# Patient Record
Sex: Female | Born: 1945
Health system: Southern US, Community
[De-identification: ages and names within clinical notes are randomized; demographics above are authoritative.]

## PROBLEM LIST (undated history)

## (undated) DIAGNOSIS — E669 Obesity, unspecified: Secondary | ICD-10-CM

## (undated) DIAGNOSIS — K219 Gastro-esophageal reflux disease without esophagitis: Secondary | ICD-10-CM

## (undated) DIAGNOSIS — F32A Depression, unspecified: Secondary | ICD-10-CM

## (undated) DIAGNOSIS — J449 Chronic obstructive pulmonary disease, unspecified: Secondary | ICD-10-CM

## (undated) DIAGNOSIS — M545 Low back pain, unspecified: Secondary | ICD-10-CM

## (undated) DIAGNOSIS — G8929 Other chronic pain: Secondary | ICD-10-CM

## (undated) DIAGNOSIS — I6529 Occlusion and stenosis of unspecified carotid artery: Secondary | ICD-10-CM

## (undated) DIAGNOSIS — G4719 Other hypersomnia: Secondary | ICD-10-CM

## (undated) DIAGNOSIS — R490 Dysphonia: Secondary | ICD-10-CM

## (undated) DIAGNOSIS — J189 Pneumonia, unspecified organism: Secondary | ICD-10-CM

## (undated) DIAGNOSIS — I480 Paroxysmal atrial fibrillation: Secondary | ICD-10-CM

## (undated) DIAGNOSIS — I5030 Unspecified diastolic (congestive) heart failure: Secondary | ICD-10-CM

## (undated) DIAGNOSIS — F329 Major depressive disorder, single episode, unspecified: Secondary | ICD-10-CM

## (undated) DIAGNOSIS — E119 Type 2 diabetes mellitus without complications: Secondary | ICD-10-CM

## (undated) DIAGNOSIS — E785 Hyperlipidemia, unspecified: Secondary | ICD-10-CM

## (undated) DIAGNOSIS — R918 Other nonspecific abnormal finding of lung field: Secondary | ICD-10-CM

## (undated) DIAGNOSIS — M1990 Unspecified osteoarthritis, unspecified site: Secondary | ICD-10-CM

## (undated) HISTORY — DX: Occlusion and stenosis of unspecified carotid artery: I65.29

## (undated) HISTORY — DX: Paroxysmal atrial fibrillation: I48.0

## (undated) HISTORY — DX: Dysphonia: R49.0

## (undated) HISTORY — DX: Low back pain, unspecified: M54.50

## (undated) HISTORY — DX: Type 2 diabetes mellitus without complications: E11.9

## (undated) HISTORY — DX: Other hypersomnia: G47.19

## (undated) HISTORY — DX: Chronic obstructive pulmonary disease, unspecified: J44.9

## (undated) HISTORY — DX: Other nonspecific abnormal finding of lung field: R91.8

## (undated) HISTORY — PX: BREAST BIOPSY: SHX20

## (undated) HISTORY — DX: Hyperlipidemia, unspecified: E78.5

## (undated) HISTORY — DX: Unspecified diastolic (congestive) heart failure: I50.30

## (undated) HISTORY — DX: Other chronic pain: G89.29

## (undated) HISTORY — PX: BREAST LUMPECTOMY: SHX2

## (undated) HISTORY — DX: Obesity, unspecified: E66.9

## (undated) HISTORY — DX: Low back pain: M54.5

---

## 1989-07-28 DIAGNOSIS — J189 Pneumonia, unspecified organism: Secondary | ICD-10-CM

## 1989-07-28 HISTORY — DX: Pneumonia, unspecified organism: J18.9

## 1992-11-27 HISTORY — PX: TOTAL ABDOMINAL HYSTERECTOMY: SHX209

## 2001-04-30 ENCOUNTER — Other Ambulatory Visit: Admission: RE | Admit: 2001-04-30 | Discharge: 2001-04-30 | Payer: Self-pay | Admitting: Obstetrics and Gynecology

## 2005-04-03 ENCOUNTER — Emergency Department (HOSPITAL_COMMUNITY): Admission: EM | Admit: 2005-04-03 | Discharge: 2005-04-03 | Payer: Self-pay | Admitting: Emergency Medicine

## 2005-07-06 ENCOUNTER — Emergency Department (HOSPITAL_COMMUNITY): Admission: EM | Admit: 2005-07-06 | Discharge: 2005-07-06 | Payer: Self-pay | Admitting: Emergency Medicine

## 2005-07-26 ENCOUNTER — Ambulatory Visit: Payer: Self-pay | Admitting: Internal Medicine

## 2008-12-28 ENCOUNTER — Inpatient Hospital Stay (HOSPITAL_COMMUNITY): Admission: EM | Admit: 2008-12-28 | Discharge: 2009-01-08 | Payer: Self-pay | Admitting: Emergency Medicine

## 2008-12-28 ENCOUNTER — Ambulatory Visit: Payer: Self-pay | Admitting: Cardiology

## 2009-01-05 ENCOUNTER — Encounter (INDEPENDENT_AMBULATORY_CARE_PROVIDER_SITE_OTHER): Payer: Self-pay | Admitting: Internal Medicine

## 2009-01-20 ENCOUNTER — Ambulatory Visit: Payer: Self-pay | Admitting: Cardiology

## 2009-01-25 ENCOUNTER — Ambulatory Visit: Payer: Self-pay | Admitting: Cardiology

## 2009-01-25 LAB — CONVERTED CEMR LAB
GFR calc Af Amer: 93 mL/min
GFR calc non Af Amer: 77 mL/min
Glucose, Bld: 133 mg/dL — ABNORMAL HIGH (ref 70–99)
Potassium: 3.8 meq/L (ref 3.5–5.1)
Sodium: 139 meq/L (ref 135–145)

## 2009-02-04 ENCOUNTER — Ambulatory Visit: Payer: Self-pay | Admitting: Cardiology

## 2009-02-04 LAB — CONVERTED CEMR LAB
BUN: 18 mg/dL (ref 6–23)
Chloride: 104 meq/L (ref 96–112)
GFR calc non Af Amer: 77 mL/min
Potassium: 4.3 meq/L (ref 3.5–5.1)

## 2009-02-15 ENCOUNTER — Ambulatory Visit: Payer: Self-pay | Admitting: Cardiology

## 2009-03-01 ENCOUNTER — Ambulatory Visit: Payer: Self-pay | Admitting: Cardiology

## 2009-03-23 ENCOUNTER — Ambulatory Visit: Payer: Self-pay | Admitting: Cardiology

## 2009-04-14 ENCOUNTER — Ambulatory Visit: Payer: Self-pay | Admitting: Cardiology

## 2009-04-28 ENCOUNTER — Encounter: Payer: Self-pay | Admitting: *Deleted

## 2009-04-29 ENCOUNTER — Ambulatory Visit: Payer: Self-pay | Admitting: Cardiology

## 2009-04-29 LAB — CONVERTED CEMR LAB: Protime: 20.2

## 2009-05-05 DIAGNOSIS — I480 Paroxysmal atrial fibrillation: Secondary | ICD-10-CM | POA: Insufficient documentation

## 2009-05-05 DIAGNOSIS — J439 Emphysema, unspecified: Secondary | ICD-10-CM | POA: Insufficient documentation

## 2009-05-14 ENCOUNTER — Ambulatory Visit: Payer: Self-pay | Admitting: Cardiology

## 2009-05-14 ENCOUNTER — Encounter (INDEPENDENT_AMBULATORY_CARE_PROVIDER_SITE_OTHER): Payer: Self-pay | Admitting: Pharmacist

## 2009-05-14 DIAGNOSIS — E785 Hyperlipidemia, unspecified: Secondary | ICD-10-CM

## 2009-05-14 DIAGNOSIS — R0989 Other specified symptoms and signs involving the circulatory and respiratory systems: Secondary | ICD-10-CM

## 2009-05-14 DIAGNOSIS — R498 Other voice and resonance disorders: Secondary | ICD-10-CM

## 2009-05-24 ENCOUNTER — Ambulatory Visit: Payer: Self-pay | Admitting: Cardiology

## 2009-05-24 ENCOUNTER — Ambulatory Visit: Payer: Self-pay

## 2009-05-24 DIAGNOSIS — R0609 Other forms of dyspnea: Secondary | ICD-10-CM

## 2009-05-24 DIAGNOSIS — R0989 Other specified symptoms and signs involving the circulatory and respiratory systems: Secondary | ICD-10-CM

## 2009-05-25 LAB — CONVERTED CEMR LAB
Alkaline Phosphatase: 51 units/L (ref 39–117)
BUN: 20 mg/dL (ref 6–23)
Bilirubin, Direct: 0.1 mg/dL (ref 0.0–0.3)
CO2: 30 meq/L (ref 19–32)
Chloride: 106 meq/L (ref 96–112)
Creatinine, Ser: 0.9 mg/dL (ref 0.4–1.2)
LDL Cholesterol: 68 mg/dL (ref 0–99)
Total Bilirubin: 0.6 mg/dL (ref 0.3–1.2)
Total CHOL/HDL Ratio: 3

## 2009-06-02 ENCOUNTER — Encounter: Payer: Self-pay | Admitting: *Deleted

## 2009-06-03 ENCOUNTER — Telehealth: Payer: Self-pay | Admitting: Cardiology

## 2009-06-07 ENCOUNTER — Encounter (INDEPENDENT_AMBULATORY_CARE_PROVIDER_SITE_OTHER): Payer: Self-pay | Admitting: Cardiology

## 2009-06-07 ENCOUNTER — Ambulatory Visit: Payer: Self-pay | Admitting: Cardiovascular Disease

## 2009-06-08 ENCOUNTER — Ambulatory Visit: Payer: Self-pay | Admitting: Internal Medicine

## 2009-06-10 ENCOUNTER — Encounter: Payer: Self-pay | Admitting: Internal Medicine

## 2009-06-16 ENCOUNTER — Encounter: Payer: Self-pay | Admitting: Cardiology

## 2009-06-16 ENCOUNTER — Ambulatory Visit: Payer: Self-pay | Admitting: Cardiology

## 2009-06-16 LAB — CONVERTED CEMR LAB
POC INR: 4
Prothrombin Time: 24.2 s

## 2009-06-24 ENCOUNTER — Ambulatory Visit: Payer: Self-pay | Admitting: Internal Medicine

## 2009-06-24 LAB — CONVERTED CEMR LAB: POC INR: 4.5

## 2009-07-08 ENCOUNTER — Ambulatory Visit: Payer: Self-pay | Admitting: Internal Medicine

## 2009-07-08 LAB — CONVERTED CEMR LAB: POC INR: 3.3

## 2009-07-28 ENCOUNTER — Ambulatory Visit: Payer: Self-pay | Admitting: Cardiology

## 2009-07-28 LAB — CONVERTED CEMR LAB: POC INR: 3.6

## 2009-08-04 ENCOUNTER — Ambulatory Visit: Payer: Self-pay | Admitting: Cardiology

## 2009-08-04 ENCOUNTER — Ambulatory Visit: Payer: Self-pay | Admitting: Pulmonary Disease

## 2009-08-04 DIAGNOSIS — J438 Other emphysema: Secondary | ICD-10-CM | POA: Insufficient documentation

## 2009-08-09 DIAGNOSIS — J984 Other disorders of lung: Secondary | ICD-10-CM

## 2009-08-11 ENCOUNTER — Ambulatory Visit: Payer: Self-pay | Admitting: Cardiology

## 2009-09-02 ENCOUNTER — Ambulatory Visit: Payer: Self-pay | Admitting: Cardiology

## 2009-09-02 LAB — CONVERTED CEMR LAB: POC INR: 2.6

## 2009-09-13 ENCOUNTER — Encounter (INDEPENDENT_AMBULATORY_CARE_PROVIDER_SITE_OTHER): Payer: Self-pay | Admitting: *Deleted

## 2009-10-07 ENCOUNTER — Ambulatory Visit: Payer: Self-pay | Admitting: Internal Medicine

## 2009-10-07 LAB — CONVERTED CEMR LAB: POC INR: 3.1

## 2009-11-04 ENCOUNTER — Ambulatory Visit: Payer: Self-pay | Admitting: Cardiology

## 2009-12-02 ENCOUNTER — Encounter (INDEPENDENT_AMBULATORY_CARE_PROVIDER_SITE_OTHER): Payer: Self-pay | Admitting: *Deleted

## 2009-12-17 ENCOUNTER — Encounter (INDEPENDENT_AMBULATORY_CARE_PROVIDER_SITE_OTHER): Payer: Self-pay | Admitting: Cardiology

## 2010-01-26 ENCOUNTER — Ambulatory Visit: Payer: Self-pay | Admitting: Cardiology

## 2010-01-31 ENCOUNTER — Ambulatory Visit: Payer: Self-pay | Admitting: Pulmonary Disease

## 2010-02-24 ENCOUNTER — Ambulatory Visit: Payer: Self-pay | Admitting: Internal Medicine

## 2010-03-31 ENCOUNTER — Ambulatory Visit: Payer: Self-pay | Admitting: Internal Medicine

## 2010-04-28 ENCOUNTER — Ambulatory Visit: Payer: Self-pay | Admitting: Internal Medicine

## 2010-05-26 ENCOUNTER — Ambulatory Visit: Payer: Self-pay | Admitting: Cardiology

## 2010-06-23 ENCOUNTER — Ambulatory Visit: Payer: Self-pay | Admitting: Internal Medicine

## 2010-06-23 LAB — CONVERTED CEMR LAB: POC INR: 2.3

## 2010-09-01 ENCOUNTER — Telehealth: Payer: Self-pay | Admitting: Cardiology

## 2010-09-09 ENCOUNTER — Ambulatory Visit: Payer: Self-pay | Admitting: Cardiology

## 2010-09-26 ENCOUNTER — Ambulatory Visit: Payer: Self-pay | Admitting: Cardiology

## 2010-09-26 DIAGNOSIS — R079 Chest pain, unspecified: Secondary | ICD-10-CM

## 2010-09-27 ENCOUNTER — Telehealth: Payer: Self-pay | Admitting: Pulmonary Disease

## 2010-10-10 ENCOUNTER — Telehealth (INDEPENDENT_AMBULATORY_CARE_PROVIDER_SITE_OTHER): Payer: Self-pay | Admitting: *Deleted

## 2010-10-11 ENCOUNTER — Encounter (HOSPITAL_COMMUNITY)
Admission: RE | Admit: 2010-10-11 | Discharge: 2010-11-26 | Payer: Self-pay | Source: Home / Self Care | Attending: Cardiology | Admitting: Cardiology

## 2010-10-11 ENCOUNTER — Ambulatory Visit: Payer: Self-pay | Admitting: Cardiology

## 2010-10-11 ENCOUNTER — Encounter: Payer: Self-pay | Admitting: Cardiovascular Disease

## 2010-10-11 ENCOUNTER — Ambulatory Visit: Payer: Self-pay

## 2010-10-11 ENCOUNTER — Encounter: Payer: Self-pay | Admitting: Cardiology

## 2010-10-13 ENCOUNTER — Ambulatory Visit: Payer: Self-pay

## 2010-10-25 ENCOUNTER — Ambulatory Visit: Payer: Self-pay | Admitting: Pulmonary Disease

## 2010-11-01 ENCOUNTER — Ambulatory Visit: Payer: Self-pay | Admitting: Cardiovascular Disease

## 2010-11-07 ENCOUNTER — Encounter: Payer: Self-pay | Admitting: Cardiology

## 2010-11-29 ENCOUNTER — Ambulatory Visit: Admission: RE | Admit: 2010-11-29 | Discharge: 2010-11-29 | Payer: Self-pay | Source: Home / Self Care

## 2010-11-29 LAB — CONVERTED CEMR LAB: POC INR: 3.8

## 2010-12-08 ENCOUNTER — Ambulatory Visit: Admission: RE | Admit: 2010-12-08 | Discharge: 2010-12-08 | Payer: Self-pay | Source: Home / Self Care

## 2010-12-08 LAB — CONVERTED CEMR LAB: POC INR: 3.6

## 2010-12-18 ENCOUNTER — Encounter: Payer: Self-pay | Admitting: Cardiology

## 2010-12-18 ENCOUNTER — Encounter: Payer: Self-pay | Admitting: Family Medicine

## 2010-12-22 ENCOUNTER — Ambulatory Visit: Admission: RE | Admit: 2010-12-22 | Discharge: 2010-12-22 | Payer: Self-pay | Source: Home / Self Care

## 2010-12-22 LAB — CONVERTED CEMR LAB: POC INR: 3.4

## 2010-12-25 LAB — CONVERTED CEMR LAB
BUN: 18 mg/dL (ref 6–23)
CO2: 26 meq/L (ref 19–32)
GFR calc non Af Amer: 48.81 mL/min (ref >60)
Glucose, Bld: 138 mg/dL — ABNORMAL HIGH (ref 70–99)
POC INR: 5.1
Potassium: 4.1 meq/L (ref 3.5–5.1)
Protime: 27.3
Sodium: 139 meq/L (ref 135–145)

## 2010-12-29 NOTE — Medication Information (Signed)
Summary: rov/eac  Anticoagulant Therapy  Managed by: Weston Brass, PharmD PCP: [facility] Supervising MD: Gala Romney MD, Reuel Boom Indication 1: Atrial Fibrillation (ICD-427.31) Lab Used: [facility] [place] Site: [address] INR POC 2.8 INR RANGE 2 - 3  Dietary changes: no    Health status changes: no    Bleeding/hemorrhagic complications: no    Recent/future hospitalizations: no    Any changes in medication regimen? no    Recent/future dental: no  Any missed doses?: no       Is patient compliant with meds? yes       Allergies: 1)  ! Clindamycin 2)  ! Ace Inhibitors  Anticoagulation Management History:      The patient is taking warfarin and comes in today for a routine follow up visit.  Negative risk factors for bleeding include an age less than 89 years old.  The bleeding index is 'low risk'.  Positive CHADS2 values include History of CHF.  Negative CHADS2 values include Age > 85 years old.  The start date was 01/05/2009.  Anticoagulation responsible provider: Zyrus Hetland MD, Reuel Boom.  INR POC: 2.8.  Cuvette Lot#: [id number].  Exp: 04/2011.    Anticoagulation Management Assessment/Plan:      The patient's current anticoagulation dose is Warfarin sodium 2.5 mg tabs: Take as directed by coumadin clinic..  The target INR is 2 - 3.  The next INR is due 04/28/2010.  Anticoagulation instructions were given to patient.  Results were reviewed/authorized by Weston Brass, PharmD.  She was notified by Weston Brass PharmD.         Prior Anticoagulation Instructions: INR 2.5  Continue taking 1/2 tablet on Monday and 1 tablet all other days.  Return to clinic in 4 weeks.   Current Anticoagulation Instructions: INR 2.8  Continue same dose of 1 tablet every day except 1/2 tablet on Monday

## 2010-12-29 NOTE — Medication Information (Signed)
Summary: rov/ewj  Anticoagulant Therapy  Managed by: Cloyde Reams, RN, BSN PCP: [facility] Supervising MD: Riley Kill MD, Maisie Fus Indication 1: Atrial Fibrillation (ICD-427.31) Lab Used: [facility] [place] Site: [address] INR POC 2.5 INR RANGE 2 - 3  Dietary changes: no    Health status changes: no    Bleeding/hemorrhagic complications: no    Recent/future hospitalizations: no    Any changes in medication regimen? no    Recent/future dental: no  Any missed doses?: no       Is patient compliant with meds? yes       Allergies: 1)  ! Clindamycin 2)  ! Ace Inhibitors  Anticoagulation Management History:      The patient is taking warfarin and comes in today for a routine follow up visit.  Negative risk factors for bleeding include an age less than 36 years old.  The bleeding index is 'low risk'.  Positive CHADS2 values include History of CHF.  Negative CHADS2 values include Age > 36 years old.  The start date was 01/05/2009.  Anticoagulation responsible provider: Riley Kill MD, Maisie Fus.  INR POC: 2.5.  Cuvette Lot#: [id number].  Exp: 03/2011.    Anticoagulation Management Assessment/Plan:      The patient's current anticoagulation dose is Warfarin sodium 2.5 mg tabs: Take as directed by coumadin clinic..  The target INR is 2 - 3.  The next INR is due 02/24/2010.  Anticoagulation instructions were given to patient.  Results were reviewed/authorized by Cloyde Reams, RN, BSN.  She was notified by Cloyde Reams RN.         Prior Anticoagulation Instructions: INR 2.7  Continue on same dosage 1 tablet daily except 1/2 tablet on Mondays.  Recheck in 4 weeks.    Current Anticoagulation Instructions: INR 2.5  Continue on same dosage 1 tablet daily except 1/2 tablet on Mondays.   Recheck in 4 weeks.   Prescriptions: WARFARIN SODIUM 2.5 MG TABS (WARFARIN SODIUM) Take as directed by coumadin clinic.  #35 x 1   Entered by:   Cloyde Reams RN   Authorized  by:   Marca Ancona, MD   Signed by:   Cloyde Reams RN on 01/26/2010   Method used:   [facility]        [facility]  [place] [address] [facility]       [address]       [place], [place]  [address]       Ph: [phone] or [phone]       Fax: [phone]   RxID:   [id number]

## 2010-12-29 NOTE — Assessment & Plan Note (Signed)
Summary: rov for emphysema   Copy to:  Dr. Shirlee Latch Primary Provider/Referring Provider:  [name]  CC:  Pt is here for a 6 month f/u appt.  Pt states breathing is unchanged from last visit.  Pt denied a cough.  pt denied any new complaints. .  History of Present Illness: The pt comes in today for f/u of her known emphysema.  She is maintaining on her bronchodilator regimen, and feels that she is improved.  She denies cough, congestion, or purulence.  Medications Prior to Update: 1)  Warfarin Sodium 2.5 Mg Tabs (Warfarin Sodium) .... Take As Directed By Coumadin Clinic. 2)  Symbicort 80-4.5 Mcg/act Aero (Budesonide-Formoterol Fumarate) .... Two Times A Day 3)  Furosemide 40 Mg Tabs (Furosemide) .... One  in The Morning and (1/2) in The Evening 4)  Klor-Con M20 20 Meq Cr-Tabs (Potassium Chloride Crys Cr) .... 2 Tablets Once Daily 5)  Calcium 500 Mg Tabs (Calcium Carbonate) .... Take One Tab Once Daily 6)  Magnesium 250 Mg Tabs (Magnesium) .... Once Daily 7)  Elite Zinc 15 Mg Caps (Zinc) .... Daily 8)  Vitamin D 1000 Unit Tabs (Cholecalciferol) .... Once Daily 9)  Diovan 80 Mg Tabs (Valsartan) .... Once Daily 10)  Diltiazem Hcl Cr 120 Mg Xr12h-Cap (Diltiazem Hcl) .... Once Daily 11)  Actos 45 Mg Tabs (Pioglitazone Hcl) .... Once Daily 12)  Glyburide Micronized 3 Mg Tabs (Glyburide Micronized) .... Once Daily 13)  Vytorin 10-40 Mg Tabs (Ezetimibe-Simvastatin) .... Once Daily 14)  Proair Hfa 108 (90 Base) Mcg/act Aers (Albuterol Sulfate) .... As Needed 15)  Spiriva Handihaler 18 Mcg Caps (Tiotropium Bromide Monohydrate) .... Use Daily As Directed  Allergies (verified): 1)  ! Clindamycin 2)  ! Ace Inhibitors  Review of Systems      See HPI  Vital Signs:  Patient profile:   65 year old female Height:      65 inches Weight:      294.38 pounds BMI:     49.16 O2 Sat:      92 % on Room air Temp:     97.6 degrees F oral Pulse rate:   96 / minute BP sitting:   118 /  56  (left arm) Cuff size:   wrist  Vitals Entered By: Arman Filter LPN (January 31, 453 9:46 AM)  O2 Flow:  Room air CC: Pt is here for a 6 month f/u appt.  Pt states breathing is unchanged from last visit.  Pt denied a cough.  pt denied any new complaints.  Comments Medications reviewed with patient Arman Filter LPN  February 01, 980 9:47 AM    Physical Exam  General:  obese female in nad Lungs:  mild decrease in bs, no wheezing or rhonchi Heart:  rrr Extremities:  1+ edema bilat., no cyanosis   Impression & Recommendations:  Problem # 1:  EMPHYSEMA (ICD-492.8)  the pt is doing well from a pulmonary standpoint.  She has been compliant with her meds, and has not had any recent acute exacerbation.  Her cough and congestion resolved since smoking cessation.  She still has hoarsenss which may be due to her vocal cord lesions, but the symbicort may be a contributor.  I have offered to try her on something different to see if hoarseness will improve, but she wishes to stay on current regimen.  I have encouraged her to work on weight loss and conditioning, and she intends on going to pulmonary rehab once she retires at  the end of the year.  Other Orders: Est. Patient Level II ([address])  Patient Instructions: 1)  no change in meds.   2)  work on weight loss and conditioning 3)  followup with me in 6mos   Immunization History:  Influenza Immunization History:    Influenza:  historical (08/27/2009)  Pneumovax Immunization History:    Pneumovax:  historical (12/28/2008)

## 2010-12-29 NOTE — Medication Information (Signed)
Summary: rov/ewj  Anticoagulant Therapy  Managed by: Eda Keys, PharmD PCP: [facility] Supervising MD: Ladona Ridgel MD, Sharlot Gowda Indication 1: Atrial Fibrillation (ICD-427.31) Lab Used: [facility] [place] Site: [address] INR POC 2.5 INR RANGE 2 - 3  Dietary changes: no    Health status changes: no    Bleeding/hemorrhagic complications: no    Recent/future hospitalizations: no    Any changes in medication regimen? no    Recent/future dental: no  Any missed doses?: no       Is patient compliant with meds? yes       Current Medications (verified): 1)  Warfarin Sodium 2.5 Mg Tabs (Warfarin Sodium) .... Take As Directed By Coumadin Clinic. 2)  Symbicort 80-4.5 Mcg/act Aero (Budesonide-Formoterol Fumarate) .... Two Times A Day 3)  Furosemide 40 Mg Tabs (Furosemide) .... One  in The Morning and (1/2) in The Evening 4)  Klor-Con M20 20 Meq Cr-Tabs (Potassium Chloride Crys Cr) .... 2 Tablets Once Daily 5)  Calcium 500 Mg Tabs (Calcium Carbonate) .... Take One Tab Once Daily 6)  Magnesium 250 Mg Tabs (Magnesium) .... Once Daily 7)  Elite Zinc 15 Mg Caps (Zinc) .... Daily 8)  Vitamin D 1000 Unit Tabs (Cholecalciferol) .... Once Daily 9)  Diovan 80 Mg Tabs (Valsartan) .... Once Daily 10)  Diltiazem Hcl Cr 120 Mg Xr12h-Cap (Diltiazem Hcl) .... Once Daily 11)  Actos 45 Mg Tabs (Pioglitazone Hcl) .... Once Daily 12)  Glyburide Micronized 3 Mg Tabs (Glyburide Micronized) .... Once Daily 13)  Vytorin 10-40 Mg Tabs (Ezetimibe-Simvastatin) .... Once Daily 14)  Proair Hfa 108 (90 Base) Mcg/act Aers (Albuterol Sulfate) .... As Needed 15)  Spiriva Handihaler 18 Mcg Caps (Tiotropium Bromide Monohydrate) .... Use Daily As Directed  Allergies (verified): 1)  ! Clindamycin 2)  ! Ace Inhibitors  Anticoagulation Management History:      The patient is taking warfarin and comes in today for a routine follow up visit.  Negative risk factors for bleeding include  an age less than 31 years old.  The bleeding index is 'low risk'.  Positive CHADS2 values include History of CHF.  Negative CHADS2 values include Age > 53 years old.  The start date was 01/05/2009.  Anticoagulation responsible provider: Ladona Ridgel MD, Sharlot Gowda.  INR POC: 2.5.  Cuvette Lot#: [id number].  Exp: 03/2011.    Anticoagulation Management Assessment/Plan:      The patient's current anticoagulation dose is Warfarin sodium 2.5 mg tabs: Take as directed by coumadin clinic..  The target INR is 2 - 3.  The next INR is due 03/31/2010.  Anticoagulation instructions were given to patient.  Results were reviewed/authorized by Eda Keys, PharmD.  She was notified by Eda Keys.         Prior Anticoagulation Instructions: INR 2.5  Continue on same dosage 1 tablet daily except 1/2 tablet on Mondays.   Recheck in 4 weeks.    Current Anticoagulation Instructions: INR 2.5  Continue taking 1/2 tablet on Monday and 1 tablet all other days.  Return to clinic in 4 weeks.

## 2010-12-29 NOTE — Assessment & Plan Note (Signed)
Summary: Cardiology Nuclear Testing  Nuclear Med Background Indications for Stress Test: Evaluation for Ischemia   History: COPD, CT/MRI, Emphysema  History Comments: 10/11 CT Hx A FIB   Symptoms: Chest Tightness with Exertion, DOE, Fatigue    Nuclear Pre-Procedure Cardiac Risk Factors: History of Smoking, Hypertension, Lipids, NIDDM, Obesity Caffeine/Decaff Intake: none NPO After: 5:30 AM Lungs: clear IV 0.9% NS with Angio Cath: 22g     IV Site: R Hand IV Started by: Cathlyn Parsons, RN Chest Size (in) 44     Cup Size B     Height (in): 65 Weight (lb): 306 BMI: 51.11 Tech Comments: Pt held Actos and Glyburide this am.  BS at home 199 at 10am. O2Sat 95% and lungs clear.  Nuclear Med Study 1 or 2 day study:  2 day     Stress Test Type:  Eugenie Birks Reading MD:  Olga Millers, MD     Referring MD:  D.McLean Resting Radionuclide:  Technetium 45m Tetrofosmin     Resting Radionuclide Dose:  33 mCi  Stress Radionuclide:  Technetium 37m Tetrofosmin     Stress Radionuclide Dose:  33 mCi   Stress Protocol  Max Systolic BP: 114 mm Hg Lexiscan: 0.4 mg   Stress Test Technologist:  Milana Na, EMT-P     Nuclear Technologist:  Doyne Keel, CNMT  Rest Procedure  Myocardial perfusion imaging was performed at rest 45 minutes following the intravenous administration of Technetium 68m Tetrofosmin.  Stress Procedure  The patient received IV Lexiscan 0.4 mg over 15-seconds.  Technetium 69m Tetrofosmin injected at 30-seconds.  There were no significant changes with infusion.  Quantitative spect images were obtained after a 45 minute delay.  QPS Raw Data Images:  Acquisition technically good; normal left ventricular size. Stress Images:  There is decreased uptake in the inferior wall and apex. Rest Images:  There is decreased uptake in the inferior wall and apex. Subtraction (SDS):  No evidence of ischemia. Transient Ischemic Dilatation:  .90  (Normal <1.22)  Lung/Heart Ratio:   .35  (Normal <0.45)  Quantitative Gated Spect Images QGS EDV:  153 ml QGS ESV:  52 ml QGS EF:  66 % QGS cine images:  Normal wall motion.   Overall Impression  Exercise Capacity: Lexiscan with no exercise. BP Response: Normal blood pressure response. Clinical Symptoms: No chest pain ECG Impression: No significant ST segment change suggestive of ischemia. Overall Impression: Normal lexiscan nuclear study with inferior and apical thinning but no ischemia.  Appended Document: Cardiology Nuclear Testing No ischemia, soft tissue attenuation.   Appended Document: Cardiology Nuclear Testing pt given results by telephone

## 2010-12-29 NOTE — Medication Information (Signed)
Summary: rov/sp  Anticoagulant Therapy  Managed by: Charolotte Eke, PharmD PCP: [facility] Supervising MD: Graciela Husbands MD, Viviann Spare Indication 1: Atrial Fibrillation (ICD-427.31) Lab Used: [facility] [place] Site: [address] INR POC 2.3 INR RANGE 2 - 3  Dietary changes: no    Health status changes: no    Bleeding/hemorrhagic complications: no    Recent/future hospitalizations: no    Any changes in medication regimen? no    Recent/future dental: no  Any missed doses?: no       Is patient compliant with meds? yes       Current Medications (verified): 1)  Warfarin Sodium 2.5 Mg Tabs (Warfarin Sodium) .... Take As Directed By Coumadin Clinic. 2)  Symbicort 80-4.5 Mcg/act Aero (Budesonide-Formoterol Fumarate) .... Two Times A Day 3)  Furosemide 40 Mg Tabs (Furosemide) .... One  in The Morning and (1/2) in The Evening 4)  Klor-Con M20 20 Meq Cr-Tabs (Potassium Chloride Crys Cr) .... 2 Tablets Once Daily 5)  Calcium 500 Mg Tabs (Calcium Carbonate) .... Take One Tab Once Daily 6)  Magnesium 250 Mg Tabs (Magnesium) .... Once Daily 7)  Elite Zinc 15 Mg Caps (Zinc) .... Daily 8)  Vitamin D 1000 Unit Tabs (Cholecalciferol) .... Once Daily 9)  Diovan 80 Mg Tabs (Valsartan) .... Once Daily 10)  Diltiazem Hcl Cr 120 Mg Xr12h-Cap (Diltiazem Hcl) .... Once Daily 11)  Actos 45 Mg Tabs (Pioglitazone Hcl) .... Once Daily 12)  Glyburide Micronized 3 Mg Tabs (Glyburide Micronized) .... Once Daily 13)  Vytorin 10-40 Mg Tabs (Ezetimibe-Simvastatin) .... Once Daily 14)  Proair Hfa 108 (90 Base) Mcg/act Aers (Albuterol Sulfate) .... As Needed 15)  Spiriva Handihaler 18 Mcg Caps (Tiotropium Bromide Monohydrate) .... Use Daily As Directed  Allergies (verified): 1)  ! Clindamycin 2)  ! Ace Inhibitors  Anticoagulation Management History:      The patient is taking warfarin and comes in today for a routine follow up visit.  Negative risk factors for bleeding include an  age less than 51 years old.  The bleeding index is 'low risk'.  Positive CHADS2 values include History of CHF.  Negative CHADS2 values include Age > 36 years old.  The start date was 01/05/2009.  Anticoagulation responsible provider: Graciela Husbands MD, Viviann Spare.  INR POC: 2.3.  Cuvette Lot#: [id number].  Exp: 06/28/2011.    Anticoagulation Management Assessment/Plan:      The patient's current anticoagulation dose is Warfarin sodium 2.5 mg tabs: Take as directed by coumadin clinic..  The target INR is 2 - 3.  The next INR is due 05/26/2010.  Anticoagulation instructions were given to patient.  Results were reviewed/authorized by Charolotte Eke, PharmD.  She was notified by Charolotte Eke, PharmD.         Prior Anticoagulation Instructions: INR 2.8  Continue same dose of 1 tablet every day except 1/2 tablet on Monday   Current Anticoagulation Instructions: The patient is to continue with the same dose of coumadin.  This dosage includes: 2.5mg  daily except 1.25mg  on Mondays.

## 2010-12-29 NOTE — Progress Notes (Signed)
Summary: refill for spiriva  Phone Note Call from Patient Call back at Home Phone [phone]   Caller: Patient Call For: [facility] Reason for Call: Refill Medication Summary of Call: Patient's spirvia refill was denied, due to patient needed ov with [facility].  Patient called made appt w/[facility] Nov. 29 @ 12:00.  Please refill spiriva--pt is out of med--[facility] . Initial call taken by: Lehman Prom,  September 27, 2010 3:22 PM  Follow-up for Phone Call        refill sent. pt aware.Carron Curie CMA  September 27, 2010 3:47 PM     Prescriptions: SPIRIVA HANDIHALER 18 MCG CAPS (TIOTROPIUM BROMIDE MONOHYDRATE) use daily as directed  #30 Capsule x 0   Entered by:   Carron Curie CMA   Authorized by:   Barbaraann Share MD   Signed by:   Carron Curie CMA on 09/27/2010   Method used:   [facility]  [place] [address]       [address]       [place], [place]  [address]       Ph: [phone] or [phone]       Fax: [phone]   RxID:   [id number]

## 2010-12-29 NOTE — Medication Information (Signed)
Summary: rov/ewj  Anticoagulant Therapy  Managed by: Eda Keys, PharmD Referring MD: D.McLean PCP: Dr. Janett Billow MD: Myrtis Ser MD, Tinnie Gens Indication 1: Atrial Fibrillation (ICD-427.31) Lab Used: [facility] [place] Site: [address] INR POC 3.4 INR RANGE 2 - 3  Dietary changes: no    Health status changes: no    Bleeding/hemorrhagic complications: no    Recent/future hospitalizations: no    Any changes in medication regimen? no    Recent/future dental: no  Any missed doses?: no       Is patient compliant with meds? yes       Current Medications (verified): 1)  Warfarin Sodium 2.5 Mg Tabs (Warfarin Sodium) .... Take As Directed By Coumadin Clinic. 2)  Symbicort 80-4.5 Mcg/act Aero (Budesonide-Formoterol Fumarate) .... Two Puffs  Times A Day 3)  Furosemide 40 Mg Tabs (Furosemide) .... One  in The Morning and (1/2) in The Evening 4)  Klor-Con M20 20 Meq Cr-Tabs (Potassium Chloride Crys Cr) .... 2 Tablets Once Daily 5)  Calcium 500 Mg Tabs (Calcium Carbonate) .... Take One Tab Once Daily 6)  Magnesium 250 Mg Tabs (Magnesium) .... Once Daily 7)  Elite Zinc 15 Mg Caps (Zinc) .... Daily 8)  Vitamin D 1000 Unit Tabs (Cholecalciferol) .... Once Daily 9)  Diovan 80 Mg Tabs (Valsartan) .... Once Daily 10)  Diltiazem Hcl Cr 120 Mg Xr12h-Cap (Diltiazem Hcl) .... Once Daily 11)  Actos 45 Mg Tabs (Pioglitazone Hcl) .... Once Daily 12)  Glyburide Micronized 3 Mg Tabs (Glyburide Micronized) .... Once Daily 13)  Vytorin 10-40 Mg Tabs (Ezetimibe-Simvastatin) .... Once Daily 14)  Proair Hfa 108 (90 Base) Mcg/act Aers (Albuterol Sulfate) .... As Needed 15)  Spiriva Handihaler 18 Mcg Caps (Tiotropium Bromide Monohydrate) .... Inhale One Puff Once Daily 16)  Fish Oil   Oil (Fish Oil) .Marland Kitchen.. 1 By Mouth Daily 17)  Vitamin C 1000 Mg Tabs (Ascorbic Acid) .Marland Kitchen.. 1 By Mouth Daily 18)  Vitamin B-12 1000 Mcg Tabs (Cyanocobalamin) .Marland Kitchen.. 1 By Mouth Daily 19)  Acetaminophen  325 Mg  Tabs (Acetaminophen) .... Pr N  Allergies (verified): 1)  ! Clindamycin 2)  ! Ace Inhibitors  Anticoagulation Management History:      The patient is taking warfarin and comes in today for a routine follow up visit.  Positive risk factors for bleeding include an age of 66 years or older.  The bleeding index is 'intermediate risk'.  Positive CHADS2 values include History of CHF.  Negative CHADS2 values include Age > 66 years old.  The start date was 01/05/2009.  Anticoagulation responsible provider: Myrtis Ser MD, Tinnie Gens.  INR POC: 3.4.  Cuvette Lot#: [id number].  Exp: 11/2011.    Anticoagulation Management Assessment/Plan:      The patient's current anticoagulation dose is Warfarin sodium 2.5 mg tabs: Take as directed by coumadin clinic..  The target INR is 2 - 3.  The next INR is due 01/05/2011.  Anticoagulation instructions were given to patient.  Results were reviewed/authorized by Eda Keys, PharmD.  She was notified by Eda Keys.         Prior Anticoagulation Instructions: INR 3.6  Skip today's dosage of Coumadin, then start taking 1 tablet daily except 1/2 tablet on Mondays and Fridays.  Recheck in 2 weeks.    Current Anticoagulation Instructions: INR 3.4  Do NOT take coumadin today.  Then start NEW dosing schedule of 1/2 tablet on Monday, Wednesday, and Friday and 1 tablet all other days.  Return to clinic in 2  weeks.

## 2010-12-29 NOTE — Assessment & Plan Note (Signed)
Summary: E3P   Primary Paige Hardin:  Dr. Rosezetta Schlatter   History of Present Illness: 65 yo with COPD, paroxysmal atrial fibrillation, and diastolic CHF presents for followup evaluation.  She has stable exertional dyspnea after walking 100 feet.  She notes some mild chest tightness after walking that distance as well.  She really seems to be more limited by her low back pain and can walk for up to an hour in the grocery store if she leans on a buggy.  Breathing seems to be worse in the fall.  She continues to be hoarse.  CT chest in 10/11 showed stable RLL nodule.  No palpitations or racing/irregular heart rate.  She has retired.  Now that she is not sitting at a desk all day long, she finds that she only needs to take Lasix once a day (less lower extremity swelling).   Labs (6/10): BNP 61, creatinine 0.9, LDL 68, HDL 40  ECG:  NSR, ? old inferior MI with Qs in II and AVF (more pronounced than in 09-02-2023).   Current Medications (verified): 1)  Warfarin Sodium 2.5 Mg Tabs (Warfarin Sodium) .... Take As Directed By Coumadin Clinic. 2)  Symbicort 80-4.5 Mcg/act Aero (Budesonide-Formoterol Fumarate) .... Two Times A Day 3)  Furosemide 40 Mg Tabs (Furosemide) .... One  in The Morning and (1/2) in The Evening 4)  Klor-Con M20 20 Meq Cr-Tabs (Potassium Chloride Crys Cr) .... 2 Tablets Once Daily 5)  Calcium 500 Mg Tabs (Calcium Carbonate) .... Take One Tab Once Daily 6)  Magnesium 250 Mg Tabs (Magnesium) .... Once Daily 7)  Elite Zinc 15 Mg Caps (Zinc) .... Daily 8)  Vitamin D 1000 Unit Tabs (Cholecalciferol) .... Once Daily 9)  Diovan 80 Mg Tabs (Valsartan) .... Once Daily 10)  Diltiazem Hcl Cr 120 Mg Xr12h-Cap (Diltiazem Hcl) .... Once Daily 11)  Actos 45 Mg Tabs (Pioglitazone Hcl) .... Once Daily 12)  Glyburide Micronized 3 Mg Tabs (Glyburide Micronized) .... Once Daily 13)  Vytorin 10-40 Mg Tabs (Ezetimibe-Simvastatin) .... Once Daily 14)  Proair Hfa 108 (90 Base) Mcg/act Aers (Albuterol Sulfate) .... As  Needed 15)  Spiriva Handihaler 18 Mcg Caps (Tiotropium Bromide Monohydrate) .... Use Daily As Directed 16)  Fish Oil   Oil (Fish Oil) .Marland Kitchen.. 1 By Mouth Daily 17)  Vitamin C 1000 Mg Tabs (Ascorbic Acid) .Marland Kitchen.. 1 By Mouth Daily 18)  Vitamin B-12 1000 Mcg Tabs (Cyanocobalamin) .Marland Kitchen.. 1 By Mouth Daily 19)  Acetaminophen 325 Mg  Tabs (Acetaminophen) .... Pr N  Allergies (verified): 1)  ! Clindamycin 2)  ! Ace Inhibitors  Past History:  Past Medical History: 1. Chronic low back pain. 2. Emphysema.  The patient has been on home oxygen in the past.  However, she is not currently using it.  PFTs 7/10 showed moderate obstructive airways disease.  3. Type 2 diabetes. 4. Paroxysmal atrial fibrillation.  The patient's first episode was in 2007, second episode was in February 2010 while in the hospital for COPD exacerbation.  The patient ended up getting direct current cardioversion and she is now on Coumadin. 5. Hyperlipidemia. 6. Diastolic congestive heart failure.  Echocardiogram in February 2010 showed an EF of 70-75% with normal RV size and function. 7. Hypertension. 8. Smoking: quit 2/11.  9. Obesity.  10.  Carotid atherosclerosis: carotid dopplers 6/10 showed 40-59% bilateral ICA stenoses.   11.  Lung nodules (serial CTs): Most recent 10/11 with 5 mm RLL nodule (stable).  Plan one last CT in 10/12.  12. Chronic hoarseness.  The pt has seen ent and has VC polyps per pt.  Family History: Reviewed history from 08/04/2009 and no changes required. There is an extensive history of breast cancer among many of her sisters   History of non-Hodgkin lymphoma and heart disease in her parents. Asthma- Mother  Social History: Tobacco Use - Yes.  2-3 ppd for the last 45+ years, quit 2/11.  Alcohol Use - no Drug Use - no Lives in [place].  Separated Lives with son Retired from [employer].   Review of Systems       All systems reviewed and negative except as per HPI.   Vital Signs:  Patient  profile:   65 year old female Height:      65 inches Weight:      306 pounds BMI:     51.11 Pulse rate:   86 / minute Resp:     18 per minute BP sitting:   95 / 60  (right arm)  Vitals Entered By: Marrion Coy, CNA (September 26, 2010 2:43 PM)  Physical Exam  General:  Well developed, well nourished, in no acute distress. Neck:  Neck supple, no JVD. No masses, thyromegaly or abnormal cervical nodes. Lungs:  mild decrease in BS bilaterally, no wheezing or rhonchi Heart:  Non-displaced PMI, chest non-tender; regular rate and rhythm, S1, S2 without rubs or gallops. 2/6 early SEM RUSB.  Carotid upstroke normal, bilateral carotid bruits.  Pedals normal pulses. 1+ ankle edema. Abdomen:  Bowel sounds positive; abdomen soft and non-tender without masses, organomegaly, or hernias noted. No hepatosplenomegaly. Extremities:  No clubbing or cyanosis. Neurologic:  Alert and oriented x 3. Psych:  Normal affect.   Impression & Recommendations:  Problem # 1:  OTHER DYSPNEA AND RESPIRATORY ABNORMALITIES (ICD-786.09) Patient has exertional dyspnea and mild chest tightness with exertion.  Her ECG does show Qs in II and AVF that appear progressive compared to the 2010 ECG.  Volume status looks ok (no elevation in neck veins).  Given risk factors and some change in ECG, I think that she needs an ischemic evaluation.  I am going to arrange a [employer].  BMET/BNP on current dose of Lasix.   Problem # 2:  ATRIAL FIBRILLATION, HX OF (ICD-V12.59) NSR today.  No palpitations.  Continue coumadin and diltiazem CD.    Problem # 3:  HYPERLIPIDEMIA-MIXED (ICD-272.4) Patient is supposed to get lipids with PCP soon, will ask them to be faxed to our office.  Goal LDL< 70.   Problem # 4:  CAROTID BRUIT (ICD-785.9) Bilateral carotid bruits.  Needs followup carotid dopplers.    Other Orders: TLB-BMP (Basic Metabolic Panel-BMET) (80048-METABOL) TLB-BNP (B-Natriuretic Peptide) (83880-BNPR) Carotid Duplex  (Carotid Duplex) Nuclear Stress Test (Nuc Stress Test)  Patient Instructions: 1)  Your physician recommends that you have lab today-- BMP/BNP 789.06  2)  Your physician has requested that you have a carotid duplex. This test is an ultrasound of the carotid arteries in your neck. It looks at blood flow through these arteries that supply the brain with blood. Allow one hour for this exam. There are no restrictions or special instructions. 3)  Your physician has requested that you have an lexiscan myoview.  For further information please visit [email or web address]  Please follow instruction sheet, as given. 4)  Your physician wants you to follow-up in: 6 months with Dr Shirlee Latch.  You will receive a reminder letter in the mail two months in advance. If you don't receive a letter, please call our office to  schedule the follow-up appointment.

## 2010-12-29 NOTE — Progress Notes (Signed)
Summary: Nuclear Pre-Procedure  Phone Note Outgoing Call Call back at [facility] Phone [phone]   Call placed by: Stanton Kidney, EMT-P,  October 10, 2010 12:51 PM Action Taken: Phone Call Completed Summary of Call: Left message with information on Myoview Information Sheet (see scanned document for details).     Nuclear Med Background Indications for Stress Test: Evaluation for Ischemia   History: COPD, CT/MRI, Emphysema  History Comments: 10/11 CT Hx A FIB   Symptoms: Chest Tightness with Exertion, DOE    Nuclear Pre-Procedure Cardiac Risk Factors: History of Smoking, Hypertension, Lipids, Obesity Height (in): 65

## 2010-12-29 NOTE — Letter (Signed)
Summary: [facility] - Delinquent Coumadin 1  Coumadin  [address]   [place], [place] [address]   Phone: [phone]  Fax: [phone]     December 17, 2009 MRN: [id number]   [facility] [place] [address] [facility] [address] [place], [place]  [address]   Dear Ms. Mabe,  This letter is being sent to you as a reminder that it is necessary for you to get your INR/PT checked regularly so that we can optimize your care.  Our records indicate that you were scheduled to have a test done recently.  As of today, we have not received the results of this test.  It is very important that you have your INR checked.  Please call our office at the number listed above to schedule an appointment at your earliest convenience.    If you have recently had your protime checked or have discontinued this medication, please contact our office at the above phone number to clarify this issue.  Thank you for this prompt attention to this important health care matter.  Sincerely,   [facility] Team

## 2010-12-29 NOTE — Medication Information (Signed)
Summary: rov/tp  Anticoagulant Therapy  Managed by: Geoffry Paradise, PharmD Referring MD: D.McLean PCP: Dr. Janett Billow MD: Eden Emms MD, Theron Arista Indication 1: Atrial Fibrillation (ICD-427.31) Lab Used: [facility] [place] Site: [address] INR POC 3.8 INR RANGE 2 - 3           Allergies: 1)  ! Clindamycin 2)  ! Ace Inhibitors  Anticoagulation Management History:      Positive risk factors for bleeding include an age of 66 years or older.  The bleeding index is 'intermediate risk'.  Positive CHADS2 values include History of CHF.  Negative CHADS2 values include Age > 40 years old.  The start date was 01/05/2009.  Anticoagulation responsible provider: Eden Emms MD, Theron Arista.  INR POC: 3.8.  Cuvette Lot#: [id number].  Exp: 12/2011.    Anticoagulation Management Assessment/Plan:      The patient's current anticoagulation dose is Warfarin sodium 2.5 mg tabs: Take as directed by coumadin clinic..  The target INR is 2 - 3.  The next INR is due 12/08/2010.  Anticoagulation instructions were given to patient.  Results were reviewed/authorized by Geoffry Paradise, PharmD.         Prior Anticoagulation Instructions: INR 2.9  Continue same dose of 1 tablet every day except 1/2 tablet on Monday.  Recheck IRN in 4 weeks.   Current Anticoagulation Instructions: INR:  3.8  Your INR is high today.  Please hold tonight's dose of Coumadin and only take 1/2 a tablet on Wednesday.  Resume your normal schedule starting Thursday at 1 table everyday except 1/2 tablet on Monday.  Return to clinic in 10 days for another INR check.

## 2010-12-29 NOTE — Medication Information (Signed)
Summary: rov-tp  Anticoagulant Therapy  Managed by: Bethena Midget, RN, BSN PCP: [facility] Supervising MD: Shirlee Latch MD, Marrio Scribner Indication 1: Atrial Fibrillation (ICD-427.31) Lab Used: [facility] [place] Site: [address] INR POC 2.5 INR RANGE 2 - 3  Dietary changes: no    Health status changes: no    Bleeding/hemorrhagic complications: no    Recent/future hospitalizations: no    Any changes in medication regimen? no    Recent/future dental: no  Any missed doses?: no       Is patient compliant with meds? yes       Allergies: 1)  ! Clindamycin 2)  ! Ace Inhibitors  Anticoagulation Management History:      The patient is taking warfarin and comes in today for a routine follow up visit.  Negative risk factors for bleeding include an age less than 66 years old.  The bleeding index is 'low risk'.  Positive CHADS2 values include History of CHF.  Negative CHADS2 values include Age > 66 years old.  The start date was 01/05/2009.  Anticoagulation responsible provider: Shirlee Latch MD, Keiondre Colee.  INR POC: 2.5.  Cuvette Lot#: [id number].  Exp: 07/2011.    Anticoagulation Management Assessment/Plan:      The patient's current anticoagulation dose is Warfarin sodium 2.5 mg tabs: Take as directed by coumadin clinic..  The target INR is 2 - 3.  The next INR is due 06/23/2010.  Anticoagulation instructions were given to patient.  Results were reviewed/authorized by Bethena Midget, RN, BSN.  She was notified by Bethena Midget, RN, BSN.         Prior Anticoagulation Instructions: The patient is to continue with the same dose of coumadin.  This dosage includes: 2.5mg  daily except 1.25mg  on Mondays.  Current Anticoagulation Instructions: INR 2.5 Continue 2.5mg s everyday except 1.25mg s on Mondays. Recheck in  4 weeks.

## 2010-12-29 NOTE — Medication Information (Signed)
Summary: rov/tm  Anticoagulant Therapy  Managed by: Weston Brass, PharmD Referring MD: D.McLean PCP: Dr. Janett Billow MD: Eden Emms MD, Theron Arista Indication 1: Atrial Fibrillation (ICD-427.31) Lab Used: [facility] [place] Site: [address] INR POC 2.9 INR RANGE 2 - 3  Dietary changes: no    Health status changes: no    Bleeding/hemorrhagic complications: no    Recent/future hospitalizations: no    Any changes in medication regimen? no    Recent/future dental: no  Any missed doses?: no       Is patient compliant with meds? yes       Allergies: 1)  ! Clindamycin 2)  ! Ace Inhibitors  Anticoagulation Management History:      The patient is taking warfarin and comes in today for a routine follow up visit.  Positive risk factors for bleeding include an age of 75 years or older.  The bleeding index is 'intermediate risk'.  Positive CHADS2 values include History of CHF.  Negative CHADS2 values include Age > 57 years old.  The start date was 01/05/2009.  Anticoagulation responsible provider: Eden Emms MD, Theron Arista.  INR POC: 2.9.  Cuvette Lot#: [id number].  Exp: 08/2011.    Anticoagulation Management Assessment/Plan:      The patient's current anticoagulation dose is Warfarin sodium 2.5 mg tabs: Take as directed by coumadin clinic..  The target INR is 2 - 3.  The next INR is due 11/29/2010.  Anticoagulation instructions were given to patient.  Results were reviewed/authorized by Weston Brass, PharmD.  She was notified by Weston Brass PharmD.         Prior Anticoagulation Instructions: Cont same: 2.5mg  daily except 1.25mg  on Mondays.  Current Anticoagulation Instructions: INR 2.9  Continue same dose of 1 tablet every day except 1/2 tablet on Monday.  Recheck IRN in 4 weeks.  Prescriptions: WARFARIN SODIUM 2.5 MG TABS (WARFARIN SODIUM) Take as directed by coumadin clinic.  #30 x 0   Entered by:   Weston Brass PharmD   Authorized by:   Marca Ancona, MD   Signed by:   Weston Brass PharmD on 11/01/2010   Method used:   Electronically to        [facility]  [place] [address] (retail)       [address]       [place], [place]  [address]       Ph: [phone] or [phone]       Fax: [phone]   RxID:   [id number]

## 2010-12-29 NOTE — Medication Information (Signed)
Summary: rov/tm  Anticoagulant Therapy  Managed by: Charolotte Eke, PharmD PCP: [facility] Supervising MD: Gala Romney MD, Reuel Boom Indication 1: Atrial Fibrillation (ICD-427.31) Lab Used: [facility] [place] Site: [address] INR POC 2.3 INR RANGE 2 - 3  Dietary changes: no    Health status changes: no    Bleeding/hemorrhagic complications: no    Recent/future hospitalizations: no    Any changes in medication regimen? no    Recent/future dental: no  Any missed doses?: no       Is patient compliant with meds? yes       Current Medications (verified): 1)  Warfarin Sodium 2.5 Mg Tabs (Warfarin Sodium) .... Take As Directed By Coumadin Clinic. 2)  Symbicort 80-4.5 Mcg/act Aero (Budesonide-Formoterol Fumarate) .... Two Times A Day 3)  Furosemide 40 Mg Tabs (Furosemide) .... One  in The Morning and (1/2) in The Evening 4)  Klor-Con M20 20 Meq Cr-Tabs (Potassium Chloride Crys Cr) .... 2 Tablets Once Daily 5)  Calcium 500 Mg Tabs (Calcium Carbonate) .... Take One Tab Once Daily 6)  Magnesium 250 Mg Tabs (Magnesium) .... Once Daily 7)  Elite Zinc 15 Mg Caps (Zinc) .... Daily 8)  Vitamin D 1000 Unit Tabs (Cholecalciferol) .... Once Daily 9)  Diovan 80 Mg Tabs (Valsartan) .... Once Daily 10)  Diltiazem Hcl Cr 120 Mg Xr12h-Cap (Diltiazem Hcl) .... Once Daily 11)  Actos 45 Mg Tabs (Pioglitazone Hcl) .... Once Daily 12)  Glyburide Micronized 3 Mg Tabs (Glyburide Micronized) .... Once Daily 13)  Vytorin 10-40 Mg Tabs (Ezetimibe-Simvastatin) .... Once Daily 14)  Proair Hfa 108 (90 Base) Mcg/act Aers (Albuterol Sulfate) .... As Needed 15)  Spiriva Handihaler 18 Mcg Caps (Tiotropium Bromide Monohydrate) .... Use Daily As Directed  Allergies (verified): 1)  ! Clindamycin 2)  ! Ace Inhibitors  Anticoagulation Management History:      The patient is taking warfarin and comes in today for a routine follow up visit.  Positive risk factors for bleeding include  an age of 29 years or older.  The bleeding index is 'intermediate risk'.  Positive CHADS2 values include History of CHF.  Negative CHADS2 values include Age > 8 years old.  The start date was 01/05/2009.  Anticoagulation responsible provider: Bensimhon MD, Reuel Boom.  INR POC: 2.3.  Cuvette Lot#: [id number].  Exp: 08/28/2011.    Anticoagulation Management Assessment/Plan:      The patient's current anticoagulation dose is Warfarin sodium 2.5 mg tabs: Take as directed by coumadin clinic..  The target INR is 2 - 3.  The next INR is due 07/21/2010.  Anticoagulation instructions were given to patient.  Results were reviewed/authorized by Charolotte Eke, PharmD.  She was notified by Charolotte Eke, PharmD.         Prior Anticoagulation Instructions: INR 2.5 Continue 2.5mg s everyday except 1.25mg s on Mondays. Recheck in  4 weeks.   Current Anticoagulation Instructions: Cont same: 2.5mg  daily except 1.25mg  on Mondays.

## 2010-12-29 NOTE — Progress Notes (Signed)
Summary: scheduling chest CT  Phone Note Outgoing Call   Call placed by: Katina Dung, RN, BSN,  September 01, 2010 12:17 PM Call placed to: Patient Summary of Call: chest CT  Follow-up for Phone Call        pt did not show for chest CT scheduled for 08/24/10--I talked with pt --she was not aware of the appointment--I will ask [facility] to contact pt to reschedule chest CT to followup on pulmonary nodules from chest CT 08/04/09--I made pt appt with Dr Shirlee Latch 09/26/10

## 2010-12-29 NOTE — Letter (Signed)
Summary: Appointment - Reminder 2  [employer], Main Office  [address]   [place], [place] [address]   Phone: [phone]  Fax: [phone]     December 02, 2009 MRN: [id number]   [facility] [place] [address] [facility] [address] [place], [place]  [address]   Dear Ms. Broom,  Our records indicate that it is time to schedule a follow-up appointment with Dr. Shirlee Latch. It is very important that we reach you to schedule this appointment. We look forward to participating in your health care needs. Please contact us at the number listed above at your earliest convenience to schedule your appointment.  If you are unable to make an appointment at this time, give [place] a call so we can update our records.  Sincerely,   [facility] Scheduling Team

## 2010-12-29 NOTE — Assessment & Plan Note (Signed)
Summary: rov for emphysema   Visit Type:  Follow-up Copy to:  Dr. Shirlee Latch Primary Provider/Referring Provider:  Dr. Rosezetta Schlatter  CC:  6 month f/u. pt states her breathing is doing "pretty good". pt c/o cough w/ Bozza phlem, sinus drainage, wheezing, and hoarseness. pt states quit smoking 12/2008. Marland Kitchen  History of Present Illness: the pt comes in today for f/u of her known emphysema.  She is maintaining on her usual bronchodilator regimen, and denies any recent acute exacerbation or pulmonary infection.  She feels her exertional tolerance is near her usual baseline.  She has minimal cough with clear mucus, but no chest congestion.  She does have intermittant hoarseness, but has been rinsing well after using her inhalers.  She also has an issue with postnasal drip.    Current Medications (verified): 1)  Warfarin Sodium 2.5 Mg Tabs (Warfarin Sodium) .... Take As Directed By Coumadin Clinic. 2)  Symbicort 80-4.5 Mcg/act Aero (Budesonide-Formoterol Fumarate) .... Two Puffs  Times A Day 3)  Furosemide 40 Mg Tabs (Furosemide) .... One  in The Morning and (1/2) in The Evening 4)  Klor-Con M20 20 Meq Cr-Tabs (Potassium Chloride Crys Cr) .... 2 Tablets Once Daily 5)  Calcium 500 Mg Tabs (Calcium Carbonate) .... Take One Tab Once Daily 6)  Magnesium 250 Mg Tabs (Magnesium) .... Once Daily 7)  Elite Zinc 15 Mg Caps (Zinc) .... Daily 8)  Vitamin D 1000 Unit Tabs (Cholecalciferol) .... Once Daily 9)  Diovan 80 Mg Tabs (Valsartan) .... Once Daily 10)  Diltiazem Hcl Cr 120 Mg Xr12h-Cap (Diltiazem Hcl) .... Once Daily 11)  Actos 45 Mg Tabs (Pioglitazone Hcl) .... Once Daily 12)  Glyburide Micronized 3 Mg Tabs (Glyburide Micronized) .... Once Daily 13)  Vytorin 10-40 Mg Tabs (Ezetimibe-Simvastatin) .... Once Daily 14)  Proair Hfa 108 (90 Base) Mcg/act Aers (Albuterol Sulfate) .... As Needed 15)  Spiriva Handihaler 18 Mcg Caps (Tiotropium Bromide Monohydrate) .... Inhale One Puff Once Daily 16)  Fish Oil   Oil  (Fish Oil) .Marland Kitchen.. 1 By Mouth Daily 17)  Vitamin C 1000 Mg Tabs (Ascorbic Acid) .Marland Kitchen.. 1 By Mouth Daily 18)  Vitamin B-12 1000 Mcg Tabs (Cyanocobalamin) .Marland Kitchen.. 1 By Mouth Daily 19)  Acetaminophen 325 Mg  Tabs (Acetaminophen) .... Pr N  Allergies (verified): 1)  ! Clindamycin 2)  ! Ace Inhibitors  Review of Systems       The patient complains of shortness of breath with activity, productive cough, and joint stiffness or pain.  The patient denies shortness of breath at rest, non-productive cough, coughing up blood, chest pain, irregular heartbeats, acid heartburn, indigestion, loss of appetite, weight change, abdominal pain, difficulty swallowing, sore throat, tooth/dental problems, headaches, nasal congestion/difficulty breathing through nose, sneezing, itching, ear ache, anxiety, depression, hand/feet swelling, rash, change in color of mucus, and fever.    Vital Signs:  Patient profile:   65 year old female Height:      65 inches Weight:      210.38 pounds BMI:     35.14 O2 Sat:      92 % on Room air Temp:     97.5 degrees F oral Pulse rate:   81 / minute BP sitting:   132 / 78  (left arm) Cuff size:   large  Vitals Entered By: Carver Fila (October 25, 2010 11:44 AM)  O2 Flow:  Room air CC: 6 month f/u. pt states her breathing is doing "pretty good". pt c/o cough w/ Farrior phlem, sinus drainage, wheezing, hoarseness.  pt states quit smoking 12/2008.  Comments meds and allergies updated Phone number updated Carver Fila  October 25, 2010 11:44 AM    Physical Exam  General:  morbidly obese female in nad Lungs:  fairly clear to auscultation Heart:  rrr Extremities:  2+ edema bilat, no cyanosis  Neurologic:  alert and oriented, moves all 4.   Impression & Recommendations:  Problem # 1:  EMPHYSEMA (ICD-492.8) the pt appears to be fairly stable on her current bronchodilator regimen.  I have asked her to continue on this, and to also work on some type of conditioning program if  possible.  She is to followup with me in 6mos, and call if having breathing issues.  I will give her the flu shot today.   Medications Added to Medication List This Visit: 1)  Symbicort 80-4.5 Mcg/act Aero (Budesonide-formoterol fumarate) .... Two puffs  times a day 2)  Spiriva Handihaler 18 Mcg Caps (Tiotropium bromide monohydrate) .... Inhale one puff once daily  Other Orders: Est. Patient Level III (04540) Influenza Vaccine MCR (98119)  Patient Instructions: 1)  stay on current inhalers 2)  rinse, gargle, and swallow after using inhalers each time. 3)  will give you a flu shot today 4)  work on weight loss 5)  followup with me in 6mos.   Immunizations Administered:  Influenza Vaccine # 1:    Vaccine Type: Fluvax MCR    Site: left deltoid    Mfr: [employer]    Dose: 0.5 ml    Route: IM    Given by: Carver Fila    Exp. Date: 05/27/2011    Lot #: [id number]  Flu Vaccine Consent Questions:    Do you have a history of severe allergic reactions to this vaccine? no    Any prior history of allergic reactions to egg and/or gelatin? no    Do you have a sensitivity to the preservative Thimersol? no    Do you have a past history of Guillan-Barre Syndrome? no    Do you currently have an acute febrile illness? no    Have you ever had a severe reaction to latex? no    Vaccine information given and explained to patient? yes    Are you currently pregnant? no

## 2010-12-29 NOTE — Medication Information (Signed)
Summary: [name]  Anticoagulant Therapy  Managed by: Cloyde Reams, RN, BSN Referring MD: D.McLean PCP: Dr. Janett Billow MD: Ladona Ridgel MD, Sharlot Gowda Indication 1: Atrial Fibrillation (ICD-427.31) Lab Used: [facility] [place] Site: [address] INR POC 3.6 INR RANGE 2 - 3  Dietary changes: no    Health status changes: no    Bleeding/hemorrhagic complications: no    Recent/future hospitalizations: no    Any changes in medication regimen? no    Recent/future dental: no  Any missed doses?: no       Is patient compliant with meds? yes       Allergies: 1)  ! Clindamycin 2)  ! Ace Inhibitors  Anticoagulation Management History:      The patient is taking warfarin and comes in today for a routine follow up visit.  Positive risk factors for bleeding include an age of 21 years or older.  The bleeding index is 'intermediate risk'.  Positive CHADS2 values include History of CHF.  Negative CHADS2 values include Age > 34 years old.  The start date was 01/05/2009.  Anticoagulation responsible provider: Ladona Ridgel MD, Sharlot Gowda.  INR POC: 3.6.  Cuvette Lot#: [id number].  Exp: 12/2011.    Anticoagulation Management Assessment/Plan:      The patient's current anticoagulation dose is Warfarin sodium 2.5 mg tabs: Take as directed by coumadin clinic..  The target INR is 2 - 3.  The next INR is due 12/22/2010.  Anticoagulation instructions were given to patient.  Results were reviewed/authorized by Cloyde Reams, RN, BSN.  She was notified by Cloyde Reams RN.         Prior Anticoagulation Instructions: INR:  3.8  Your INR is high today.  Please hold tonight's dose of Coumadin and only take 1/2 a tablet on Wednesday.  Resume your normal schedule starting Thursday at 1 table everyday except 1/2 tablet on Monday.  Return to clinic in 10 days for another INR check.     Current Anticoagulation Instructions: INR 3.6  Skip today's dosage of Coumadin, then start taking 1 tablet daily except 1/2  tablet on Mondays and Fridays.  Recheck in 2 weeks.

## 2011-01-12 ENCOUNTER — Encounter: Payer: Self-pay | Admitting: Internal Medicine

## 2011-01-12 ENCOUNTER — Encounter (INDEPENDENT_AMBULATORY_CARE_PROVIDER_SITE_OTHER): Payer: Medicare Other

## 2011-01-12 DIAGNOSIS — I4891 Unspecified atrial fibrillation: Secondary | ICD-10-CM

## 2011-01-12 DIAGNOSIS — Z7901 Long term (current) use of anticoagulants: Secondary | ICD-10-CM

## 2011-01-18 NOTE — Medication Information (Signed)
Summary: rov/sp  Anticoagulant Therapy  Managed by: Windell Hummingbird, RN Referring MD: D.McLean PCP: Dr. Janett Billow MD: Tenny Craw MD, Gunnar Fusi Indication 1: Atrial Fibrillation (ICD-427.31) Lab Used: [facility] [place] Site: [address] INR POC 3.6 INR RANGE 2 - 3  Dietary changes: no    Health status changes: no    Bleeding/hemorrhagic complications: no    Recent/future hospitalizations: no    Any changes in medication regimen? no    Recent/future dental: no  Any missed doses?: no       Is patient compliant with meds? yes       Allergies: 1)  ! Clindamycin 2)  ! Ace Inhibitors  Anticoagulation Management History:      The patient is taking warfarin and comes in today for a routine follow up visit.  Positive risk factors for bleeding include an age of 66 years or older.  The bleeding index is 'intermediate risk'.  Positive CHADS2 values include History of CHF.  Negative CHADS2 values include Age > 32 years old.  The start date was 01/05/2009.  Anticoagulation responsible provider: Tenny Craw MD, Gunnar Fusi.  INR POC: 3.6.  Cuvette Lot#: [id number].  Exp: 11/2011.    Anticoagulation Management Assessment/Plan:      The patient's current anticoagulation dose is Warfarin sodium 2.5 mg tabs: Take as directed by coumadin clinic..  The target INR is 2 - 3.  The next INR is due 01/26/2011.  Anticoagulation instructions were given to patient.  Results were reviewed/authorized by Windell Hummingbird, RN.  She was notified by Windell Hummingbird, RN.         Prior Anticoagulation Instructions: INR 3.4  Do NOT take coumadin today.  Then start NEW dosing schedule of 1/2 tablet on Monday, Wednesday, and Friday and 1 tablet all other days.  Return to clinic in 2 weeks.    Current Anticoagulation Instructions: INR 3.6 Skip today's dose. Then begin taking 1/2 tablet every day, except take 1 tablet on Tuesdays, Thursdays, and Saturdays. Recheck in 2 weeks.

## 2011-01-23 ENCOUNTER — Encounter: Payer: Self-pay | Admitting: Cardiology

## 2011-01-23 DIAGNOSIS — Z8679 Personal history of other diseases of the circulatory system: Secondary | ICD-10-CM

## 2011-01-23 DIAGNOSIS — I4891 Unspecified atrial fibrillation: Secondary | ICD-10-CM

## 2011-03-03 ENCOUNTER — Other Ambulatory Visit: Payer: Self-pay | Admitting: Cardiology

## 2011-03-14 LAB — GLUCOSE, CAPILLARY
Glucose-Capillary: 136 mg/dL — ABNORMAL HIGH (ref 70–99)
Glucose-Capillary: 155 mg/dL — ABNORMAL HIGH (ref 70–99)
Glucose-Capillary: 156 mg/dL — ABNORMAL HIGH (ref 70–99)
Glucose-Capillary: 171 mg/dL — ABNORMAL HIGH (ref 70–99)
Glucose-Capillary: 174 mg/dL — ABNORMAL HIGH (ref 70–99)
Glucose-Capillary: 188 mg/dL — ABNORMAL HIGH (ref 70–99)
Glucose-Capillary: 194 mg/dL — ABNORMAL HIGH (ref 70–99)
Glucose-Capillary: 201 mg/dL — ABNORMAL HIGH (ref 70–99)
Glucose-Capillary: 203 mg/dL — ABNORMAL HIGH (ref 70–99)
Glucose-Capillary: 223 mg/dL — ABNORMAL HIGH (ref 70–99)
Glucose-Capillary: 232 mg/dL — ABNORMAL HIGH (ref 70–99)
Glucose-Capillary: 235 mg/dL — ABNORMAL HIGH (ref 70–99)
Glucose-Capillary: 236 mg/dL — ABNORMAL HIGH (ref 70–99)
Glucose-Capillary: 237 mg/dL — ABNORMAL HIGH (ref 70–99)
Glucose-Capillary: 248 mg/dL — ABNORMAL HIGH (ref 70–99)
Glucose-Capillary: 270 mg/dL — ABNORMAL HIGH (ref 70–99)
Glucose-Capillary: 297 mg/dL — ABNORMAL HIGH (ref 70–99)
Glucose-Capillary: 314 mg/dL — ABNORMAL HIGH (ref 70–99)
Glucose-Capillary: 331 mg/dL — ABNORMAL HIGH (ref 70–99)
Glucose-Capillary: 334 mg/dL — ABNORMAL HIGH (ref 70–99)
Glucose-Capillary: 336 mg/dL — ABNORMAL HIGH (ref 70–99)
Glucose-Capillary: 352 mg/dL — ABNORMAL HIGH (ref 70–99)
Glucose-Capillary: 93 mg/dL (ref 70–99)
Glucose-Capillary: 97 mg/dL (ref 70–99)

## 2011-03-14 LAB — CBC
HCT: 35.3 % — ABNORMAL LOW (ref 36.0–46.0)
HCT: 37.4 % (ref 36.0–46.0)
HCT: 41.4 % (ref 36.0–46.0)
Hemoglobin: 12.1 g/dL (ref 12.0–15.0)
Hemoglobin: 12.3 g/dL (ref 12.0–15.0)
Hemoglobin: 12.8 g/dL (ref 12.0–15.0)
Hemoglobin: 13.6 g/dL (ref 12.0–15.0)
MCHC: 33.9 g/dL (ref 30.0–36.0)
MCHC: 34.1 g/dL (ref 30.0–36.0)
MCHC: 34.3 g/dL (ref 30.0–36.0)
MCV: 90.6 fL (ref 78.0–100.0)
MCV: 90.6 fL (ref 78.0–100.0)
MCV: 90.6 fL (ref 78.0–100.0)
MCV: 90.7 fL (ref 78.0–100.0)
MCV: 91.7 fL (ref 78.0–100.0)
Platelets: 235 10*3/uL (ref 150–400)
RBC: 3.85 MIL/uL — ABNORMAL LOW (ref 3.87–5.11)
RBC: 4.01 MIL/uL (ref 3.87–5.11)
RBC: 4.21 MIL/uL (ref 3.87–5.11)
RBC: 4.39 MIL/uL (ref 3.87–5.11)
RDW: 15.7 % — ABNORMAL HIGH (ref 11.5–15.5)
RDW: 15.8 % — ABNORMAL HIGH (ref 11.5–15.5)
RDW: 16.3 % — ABNORMAL HIGH (ref 11.5–15.5)
WBC: 12.8 10*3/uL — ABNORMAL HIGH (ref 4.0–10.5)

## 2011-03-14 LAB — URINALYSIS, ROUTINE W REFLEX MICROSCOPIC
Glucose, UA: NEGATIVE mg/dL
Specific Gravity, Urine: 1.014 (ref 1.005–1.030)
Urobilinogen, UA: 0.2 mg/dL (ref 0.0–1.0)
pH: 6.5 (ref 5.0–8.0)

## 2011-03-14 LAB — DIFFERENTIAL
Basophils Absolute: 0 10*3/uL (ref 0.0–0.1)
Basophils Absolute: 0.1 10*3/uL (ref 0.0–0.1)
Basophils Relative: 0 % (ref 0–1)
Lymphocytes Relative: 8 % — ABNORMAL LOW (ref 12–46)
Lymphs Abs: 0.7 10*3/uL (ref 0.7–4.0)
Monocytes Absolute: 0.5 10*3/uL (ref 0.1–1.0)
Monocytes Relative: 6 % (ref 3–12)
Neutro Abs: 10.2 10*3/uL — ABNORMAL HIGH (ref 1.7–7.7)
Neutro Abs: 7.8 10*3/uL — ABNORMAL HIGH (ref 1.7–7.7)
Neutrophils Relative %: 96 % — ABNORMAL HIGH (ref 43–77)

## 2011-03-14 LAB — COMPREHENSIVE METABOLIC PANEL
AST: 33 U/L (ref 0–37)
Albumin: 3.6 g/dL (ref 3.5–5.2)
Alkaline Phosphatase: 58 U/L (ref 39–117)
BUN: 10 mg/dL (ref 6–23)
BUN: 16 mg/dL (ref 6–23)
Chloride: 93 mEq/L — ABNORMAL LOW (ref 96–112)
Creatinine, Ser: 0.78 mg/dL (ref 0.4–1.2)
GFR calc Af Amer: >60 mL/min (ref >60)
Glucose, Bld: 312 mg/dL — ABNORMAL HIGH (ref 70–99)
Potassium: 3.9 mEq/L (ref 3.5–5.1)
Total Bilirubin: 0.5 mg/dL (ref 0.3–1.2)
Total Protein: 6.4 g/dL (ref 6.0–8.3)
Total Protein: 6.7 g/dL (ref 6.0–8.3)

## 2011-03-14 LAB — BASIC METABOLIC PANEL
BUN: 17 mg/dL (ref 6–23)
BUN: 17 mg/dL (ref 6–23)
BUN: 19 mg/dL (ref 6–23)
BUN: 22 mg/dL (ref 6–23)
CO2: 31 mEq/L (ref 19–32)
CO2: 35 mEq/L — ABNORMAL HIGH (ref 19–32)
CO2: 36 mEq/L — ABNORMAL HIGH (ref 19–32)
CO2: 38 mEq/L — ABNORMAL HIGH (ref 19–32)
CO2: 38 mEq/L — ABNORMAL HIGH (ref 19–32)
Calcium: 10 mg/dL (ref 8.4–10.5)
Calcium: 8.5 mg/dL (ref 8.4–10.5)
Calcium: 8.7 mg/dL (ref 8.4–10.5)
Chloride: 92 mEq/L — ABNORMAL LOW (ref 96–112)
Chloride: 92 mEq/L — ABNORMAL LOW (ref 96–112)
Chloride: 92 mEq/L — ABNORMAL LOW (ref 96–112)
Chloride: 93 mEq/L — ABNORMAL LOW (ref 96–112)
Chloride: 94 mEq/L — ABNORMAL LOW (ref 96–112)
Chloride: 94 mEq/L — ABNORMAL LOW (ref 96–112)
Chloride: 98 mEq/L (ref 96–112)
Creatinine, Ser: 0.74 mg/dL (ref 0.4–1.2)
Creatinine, Ser: 0.75 mg/dL (ref 0.4–1.2)
Creatinine, Ser: 0.86 mg/dL (ref 0.4–1.2)
Creatinine, Ser: 0.87 mg/dL (ref 0.4–1.2)
Creatinine, Ser: 0.94 mg/dL (ref 0.4–1.2)
GFR calc Af Amer: >60 mL/min (ref >60)
GFR calc Af Amer: >60 mL/min (ref >60)
GFR calc Af Amer: >60 mL/min (ref >60)
GFR calc Af Amer: >60 mL/min (ref >60)
GFR calc Af Amer: >60 mL/min (ref >60)
GFR calc non Af Amer: >60 mL/min (ref >60)
GFR calc non Af Amer: >60 mL/min (ref >60)
GFR calc non Af Amer: >60 mL/min (ref >60)
Glucose, Bld: 103 mg/dL — ABNORMAL HIGH (ref 70–99)
Glucose, Bld: 198 mg/dL — ABNORMAL HIGH (ref 70–99)
Potassium: 3 mEq/L — ABNORMAL LOW (ref 3.5–5.1)
Potassium: 3.3 mEq/L — ABNORMAL LOW (ref 3.5–5.1)
Potassium: 3.7 mEq/L (ref 3.5–5.1)
Potassium: 4.1 mEq/L (ref 3.5–5.1)
Potassium: 4.2 mEq/L (ref 3.5–5.1)
Potassium: 4.4 mEq/L (ref 3.5–5.1)
Sodium: 135 mEq/L (ref 135–145)
Sodium: 137 mEq/L (ref 135–145)
Sodium: 138 mEq/L (ref 135–145)
Sodium: 139 mEq/L (ref 135–145)

## 2011-03-14 LAB — BLOOD GAS, ARTERIAL
O2 Content: 4 L/min
O2 Saturation: 96.9 %
Patient temperature: 98.6

## 2011-03-14 LAB — URINE MICROSCOPIC-ADD ON

## 2011-03-14 LAB — CK ISOENZYMES
CK-MB: 0 % (ref <5)
CK-MM: 94 % — ABNORMAL LOW (ref 95–100)
Creatine Kinase-Total: 212 U/L — ABNORMAL HIGH (ref 29–143)

## 2011-03-14 LAB — PROTIME-INR
INR: 1 (ref 0.00–1.49)
Prothrombin Time: 13 seconds (ref 11.6–15.2)
Prothrombin Time: 13.9 seconds (ref 11.6–15.2)
Prothrombin Time: 18.3 seconds — ABNORMAL HIGH (ref 11.6–15.2)
Prothrombin Time: 25.6 seconds — ABNORMAL HIGH (ref 11.6–15.2)

## 2011-03-14 LAB — SODIUM: Sodium: 136 mEq/L (ref 135–145)

## 2011-03-14 LAB — BRAIN NATRIURETIC PEPTIDE
Pro B Natriuretic peptide (BNP): 40 pg/mL (ref 0.0–100.0)
Pro B Natriuretic peptide (BNP): 52 pg/mL (ref 0.0–100.0)

## 2011-03-14 LAB — HEPARIN LEVEL (UNFRACTIONATED)
Heparin Unfractionated: 0.22 IU/mL — ABNORMAL LOW (ref 0.30–0.70)
Heparin Unfractionated: 0.25 IU/mL — ABNORMAL LOW (ref 0.30–0.70)
Heparin Unfractionated: 0.51 IU/mL (ref 0.30–0.70)

## 2011-03-14 LAB — CARDIAC PANEL(CRET KIN+CKTOT+MB+TROPI)
CK, MB: 2.8 ng/mL (ref 0.3–4.0)
Relative Index: 1.5 (ref 0.0–2.5)
Relative Index: 1.6 (ref 0.0–2.5)
Total CK: 187 U/L — ABNORMAL HIGH (ref 7–177)
Troponin I: 0.01 ng/mL (ref 0.00–0.06)

## 2011-03-14 LAB — POCT CARDIAC MARKERS

## 2011-03-15 ENCOUNTER — Telehealth: Payer: Self-pay | Admitting: Pharmacist

## 2011-03-15 ENCOUNTER — Other Ambulatory Visit: Payer: Self-pay | Admitting: *Deleted

## 2011-03-15 NOTE — Telephone Encounter (Signed)
Telephoned pt because she is past due for INR check. Last seen in Feb. In [facility]. When I telephoned pt to discuss OV she states that because of the pain in her legs she is not able to get in the clinic. I discussed with her the dangers of taking coumadin without monitoring. She then stated she would discontinue med, I explained to her at that time the potential  dangers associated with being off couamdin. She states she has a months supply of coumadin and will do her best to get here when she can.

## 2011-03-15 NOTE — Telephone Encounter (Signed)
Kennon Rounds and Thurston Hole: let's change Mrs Roesler over to Pradaxa 150 mg bid.  Last creatinine that I saw was normal.

## 2011-03-16 MED ORDER — DABIGATRAN ETEXILATE MESYLATE 150 MG PO CAPS: 150.0000 mg | ORAL_CAPSULE | Freq: Two times a day (BID) | ORAL | Status: DC

## 2011-03-16 NOTE — Telephone Encounter (Signed)
Spoke with pt.   Discussed changing to Pradaxa.  Pt is willing to change.  Instructed pt to hold Coumadin x 2 days then start Pradaxa.  Pt aware of dosing instructions and potential side effects.  Will send Rx to [facility] [place].

## 2011-04-02 ENCOUNTER — Other Ambulatory Visit: Payer: Self-pay | Admitting: Cardiology

## 2011-04-05 ENCOUNTER — Other Ambulatory Visit: Payer: Self-pay | Admitting: Cardiology

## 2011-04-11 NOTE — Consult Note (Signed)
Paige Hardin, Paige Hardin                  ACCOUNT NO.:  [id number]   MEDICAL RECORD NO.:  [id number]          PATIENT TYPE:  INP   LOCATION:  2041                         FACILITY:  [facility]   PHYSICIAN:  Marca Ancona, MD      DATE OF BIRTH:  03-May-1946   DATE OF CONSULTATION:  01/04/2009  DATE OF DISCHARGE:                                 CONSULTATION   PRIMARY CARDIOLOGIST:  Marca Ancona, MD   PRIMARY CARE PHYSICIAN:  [facility]   REQUESTING PHYSICIAN:  Theodosia Paling, MD, [facility]   REASON FOR CONSULTATION:  Atrial fibrillation with RVR, new onset.   HISTORY OF PRESENT ILLNESS:  A 65 year old obese Caucasian female with  known history of COPD, diabetes, paroxysmal atrial fibrillation admitted  with exacerbation of COPD after weeks of increasing dyspnea on exertion.  On admission, the patient was in normal sinus rhythm.  On early morning  hours of January 04, 2009, the patient went into AFib with RVR,  ventricular rate of 140-150.  The patient states she was seen by  Electrophysiology approximately 3 years ago, was placed on p.o.  Cardizem, and had no further followup at that time and the patient had  been in normal sinus rhythm.  The patient is asymptomatic with no  complaints of palpitations or chest pain.  She has chronic shortness of  breath.  She denies any dizziness, orthopnea, or diaphoresis.  The  patient was started on a Cardizem drip by Dr. Glade Lloyd and p.o. digoxin.  We were asked to advise for further assistance.   REVIEW OF SYSTEMS:  Positive for shortness of breath, dyspnea on  exertion, cough and wheezing with pain in the left arm secondary to  fracture with now a left arm cast.  The patient's arm fracture is  secondary to fall because of a missed step.   PAST MEDICAL HISTORY:  1. Diabetes.  2. SVT.  3. COPD.  4. Left arm fracture, elbow status post fall.  5. Atrial fib approximately 3 years ago with no reoccurrence that we      are aware  of.  6. Questionable medical noncompliance.  7. History of hypercholesterolemia.   PAST SURGICAL HISTORY:  1. Hysterectomy.  2. Left breast lumpectomy.   SOCIAL HISTORY:  She lives in [place] with her son.  She is a Tree surgeon.  She is separated.  She is a 60-pack year smoker.  Negative for EtOH or drug use.   FAMILY HISTORY:  Non-Hodgkin lymphoma and two sisters with breast  cancer.   CURRENT MEDICATIONS AT HOME:  1. Albuterol inhaler.  2. Aspirin 81 mg daily.  3. Symbicort 2 puffs b.i.d.  4. Cardizem 180 mg daily.  5. Vytorin.  6. Lasix 40 mg b.i.d.  7. Insulin per sliding scale.  8. Nicotine patch.  9. Benicar 10 mg daily.  10.Protonix 40 mg daily.  11.Prednisone 40 mg to taper.   ALLERGIES:  To CLINDAMYCIN.   LABORATORY DATA:  Sodium 135, potassium 4.2, chloride 98, CO2 of 31, BUN  19, and creatinine 0.70.  Platelets 232.  BNP 52.  EKG:  AFib with RVR,  rate of 161 beats per minute.  CT scan of the chest dated December 29, 2008:  A 4-mm noncalcified pulmonary nodule in the right lower lobe.  Chest x-ray:  Cardiomegaly with bronchial thickening and interstitial  prominence.   PHYSICAL EXAMINATION:  VITAL SIGNS:  Blood pressure currently 94/60,  heart rate between 120 and 150, respirations between 20 and 26,  temperature afebrile, and O2 sat 95% on 4 L.  HEENT:  Head is normocephalic and atraumatic.  EYES:  PERRLA.  Mucous membranes and mouth, pink and moist.  Tongue is  midline.  NECK:  Supple.  There is positive JVD, 9-10 cm.  No bruit is  auscultated.  CARDIOVASCULAR:  Irregular rhythm, tachycardic without rubs or gallops.  Pulses are 2+ and equal without bruits.  LUNGS:  Expiratory wheezes without crackles or rhonchi.  ABDOMEN:  Obese and nontender with 2+ bowel sounds.  EXTREMITIES:  Positive 1+ pitting edema on lower extremities  bilaterally, pretibially.  NEUROLOGIC:  Cranial nerves II-XII are grossly intact.  MUSCULOSKELETAL:  Left arm is  in a cast.   IMPRESSION:  1. Atrial fibrillation with rapid ventricular response, sudden onset      with history of paroxysmal atrial fibrillation in the past.  2. Chronic obstructive pulmonary disease exacerbation.  3. Ongoing tobacco abuse.  4. Diabetes.   PLAN:  A 65 year old obese Caucasian female with history of COPD,  diabetes, paroxysmal atrial fibrillation admitted with COPD exacerbation  and multiple cardiovascular risk factors now in AFib with RVR.  Agree  with diltiazem drip and we will stop p.o. digoxin and begin IV loading.  Discontinue Lovenox and begin heparin drip to bridge to Coumadin.  The  patient does feel palpitations, but otherwise no symptoms.  Heart rate  is up to 150s-160s.  Diltiazem bolus given along with diltiazem drip and  heart rate still high.  Digoxin IV load was given and heart rate  remained elevated.  Systolic blood pressure in the 90s after the onset  of atrial fibrillation.  The patient is in window for cardioversion  without a TEE.   Our plan will be to have the patient prepared for cardioversion this  afternoon as she has eaten breakfast.  If she stays stable, we will give  1 gram of magnesium also.  We will continue diltiazem drip for now.  If  the patient deteriorates  or heart rate becomes more elevated, we will discontinue diltiazem drip  and begin amiodarone.  The patient has mild volume overload with  increased JVP, likely diastolic CHF.  We will avoid any further IV  fluids.  We will make further recommendations after TEE cardioversion  depending upon the patient's response.      Bettey Mare. Lyman Bishop, NP      Marca Ancona, MD  Electronically Signed    KML/MEDQ  D:  01/04/2009  T:  01/05/2009  Job:  [id number]   cc:   [facility]

## 2011-04-11 NOTE — Discharge Summary (Signed)
Paige Hardin, DONA                  ACCOUNT NO.:  [id number]   MEDICAL RECORD NO.:  [id number]          PATIENT TYPE:  INP   LOCATION:  2041                         FACILITY:  [facility]   PHYSICIAN:  Theodosia Paling, MD    DATE OF BIRTH:  1946/08/12   DATE OF ADMISSION:  12/28/2008  DATE OF DISCHARGE:                               DISCHARGE SUMMARY   INTERIM DISCHARGE SUMMARY:  The date of this interim discharge summary  is January 05, 2009.   ADMITTING HISTORY:  For the admitting history please refer to the  excellent admission note dictated by Dr. Raphael Gibney under the history  of present illness on December 28, 2008.   HOSPITAL COURSE:  The following issues were addressed during this  hospitalization.   1. Acute COPD exacerbation.  The patient had a repeat protracted      course of COPD exacerbation.  She received nebulizers, IV      antibiotics and IV steroids during her hospitalization.  She      continued to need a lot of oxygen support.  At this time she is on      minimal oxygen; however, she still felt very tired, fatigued and      short of breath.  I continued to provide her with respiratory      support while she was in the hospital.   1. Atrial fibrillation.  On January 04, 2009 the patient developed      acute atrial fibrillation with hemodynamic disturbances.      Cardiology consult was obtained who performed cardioversion on the      patient.  This was successful and the patient reverted to sinus      rhythm; and, hemodynamically she was stabilized.  She underwent an      echocardiogram, which showed an ejection fraction of 70-75% without      any wall motion defects.  The patient is already on heparin and      Coumadin.  Cardiology continued to follow the patient while she was      here in the hospital here.  At this time the patient is on heparin      3,000 mg and she will be followed up as an outpatient.   1. Bilateral pedal edema.  The patient received IV Lasix  which later      was switched to PO Lasix.   1. Diabetes mellitus.  The patient continued to receive insulin and      her blood sugars were under reasonable control.   DISCHARGE DIAGNOSES:  1. Acute chronic obstructive pulmonary disease exacerbation.  2. Acute onset atrial fibrillation status post cardioversion back to      sinus rhythm.  3. Diabetes mellitus.  4. Bilateral pedal edema.   CONSULTATIONS:  December 29, 2008 consultation was obtained from  cardiology.   PROCEDURE:  Cardioversion for Atrial Fibrillationm was performed.   DISCHARGE MEDICATIONS:  Discharge medications will be dictated by the  discharging physician.   Thank you so much for this allowing me to do this interim discharge  summary.  Theodosia Paling, MD  Electronically Signed     NP/MEDQ  D:  01/05/2009  T:  01/06/2009  Job:  [id number]

## 2011-04-11 NOTE — H&P (Signed)
NAME:  Paige Hardin, Paige Hardin                  ACCOUNT NO.:  [id number]   MEDICAL RECORD NO.:  [id number]          PATIENT TYPE:  INP   LOCATION:  4737                         FACILITY:  [facility]   PHYSICIAN:  Raphael Gibney, MD        DATE OF BIRTH:  1946/10/09   DATE OF ADMISSION:  12/28/2008  DATE OF DISCHARGE:                              HISTORY & PHYSICAL   PRIMARY CARE PHYSICIAN:  Unassigned.   ADMITTING TEAM:  [facility] D.   CHIEF COMPLAINT:  Worsening shortness of breath.   HISTORY OF PRESENT ILLNESS:  Paige Hardin is a 65 year old Caucasian female  with an extensive smoking history of 2-3 packs a day for the last 45  years who comes in with worsening shortness of breath of about 2-3 days  duration.  She mentions that her shortness of breath has been worsening  for the last 2-3 days but actually has been having episodes of  short  windedness for the last many months.  She has been using inhalers and  they mostly relieve these episodes of shortness of breath.  Today, she  tried the inhalers without much improvement and hence presented to the  emergency department for further management.  She also endorses the some  amount of right-sided rib cage pain on and off for the last few days as  well.  She denies any fevers, chills, cough, or cold at this time.  She  does note some amount of whitish sputum, which she has had for last many  years.   PAST MEDICAL HISTORY:  1. Diabetes mellitus.  2. Recent history of supraventricular tachycardia.  3. COPD with no pulmonary function tests confirmation.  4. Recent left arm fracture.   PAST SURGICAL HISTORY:  1. Hysterectomy.  2. Left-sided breast lumpectomy.   ALLERGIES:  CLINDAMYCIN causes a rash.   SOCIAL HISTORY:  [name] states that she smokes 2-3 packs a day for  the last 45-50 years.  She denies any significant history of alcohol or  illicit drug use.   FAMILY HISTORY:  There is an extensive history of breast cancer among  many of  her sisters also gives a history of non-Hodgkin lymphoma and  heart disease in her parents.   MEDICATIONS:  1. Multivitamin p.o. daily.  2. Vytorin 10/40 p.o. daily.  3. Diltiazem Extended Release 180 mg p.o. daily.  4. Symbicort 80/4.5 mcg two b.i.d.  5. Actos 45 mg p.o. daily.  6. ProAir HFA 90 mcg one-two puffs q.6 h.  7. Glyburide 10 mg one tablet p.o. daily.  8. Diovan 80 mg p.o. daily.  9. Triamterene/hydrochlorothiazide 37.5/25 p.o. daily.   REVIEW OF SYSTEMS:  Detailed review of systems was performed and found  to be negative as expect as those mentioned in the HPI.   PHYSICAL EXAMINATION:  VITAL SIGNS:  Pulse 101, blood pressure 124/66,  temperature 97.3, respirations 20.  GENERAL:  Ms. Hefferan is a 65 year old female in no apparent distress with  nasal cannula oxygen at 3 L.  HEENT:  No pallor present, use of accessory muscles of respiration  mildly present.  Pupils were equal and reactive to light.  Extraocular  movements were intact.  Oropharynx is clear.  NECK:  Supple.  CVS:  Normal S1, S2.  No murmurs, rubs, or gallops.  RESPIRATORY:  End-expiratory rhonchi present in most zones.  Poor air  entry bilaterally.  ABDOMEN:  Soft, nontender.  Bowel sounds present.  EXTREMITIES:  No cyanosis, clubbing, or edema.  NEURO:  No obvious focal changes.   Chest x-ray, no obvious acute cardiopulmonary abnormalities.   LABORATORY DATA:  WBC 10.6, hemoglobin 12.8, platelets 219.  Sodium 133,  potassium 3.9, chloride 93, CO2 of 28, glucose 312, BUN 16, creatinine  1.2, SGOT 13, SGPT 21, total protein 6.4.  Cardiac isoenzymes, troponin  less than 0.05.   ASSESSMENT:  Paige Hardin is a 65 year old Caucasian female who presents to  our emergency department with what appears to be a case of COPD  exacerbation.   PLAN:  1. We will admit Ms. Dobis to telemetry.  Continue nasal cannula      oxygen to keep saturation over 90%.  We will obtain an ABG to note      the status of her  current oxygenation.  2. We will start IV steroid in the form of Solu-Medrol 60 mg IV b.i.d.  3. Start albuterol/Atrovent nebulizers q.4-6 h.  We will send out      sputum culture as well.  4. We will continue her home diabetes and p.o. medications.  5. We will continue her home inhaler regimen.  6. We will continue her home hypertension medications of Diovan and      triamterene/hydrochlorothiazide.  7. Gastrointestinal prophylaxis with PPI's.  8. Deep venous thrombosis prophylaxis, Lovenox 40 subcu q.24 hours.  9. Code status is full.      Raphael Gibney, MD  Electronically Signed     HV/MEDQ  D:  12/29/2008  T:  12/30/2008  Job:  [id number]

## 2011-04-11 NOTE — Discharge Summary (Signed)
Paige Hardin, Paige Hardin                  ACCOUNT NO.:  [id number]   MEDICAL RECORD NO.:  [id number]          PATIENT TYPE:  INP   LOCATION:  2041                         FACILITY:  [facility]   PHYSICIAN:  Marcellus Scott, MD     DATE OF BIRTH:  1946-09-14   DATE OF ADMISSION:  12/28/2008  DATE OF DISCHARGE:  01/08/2009                               DISCHARGE SUMMARY   PRIMARY MEDICAL DOCTOR:  Evelena Peat, M.D.   ORTHOPEDIST:  Lubertha Basque. Jerl Santos, M.D.   CARDIOLOGIST:  Dr. Shirlee Latch of [facility].   This is an addendum to the interim discharge summary done by Dr. Glade Lloyd  on January 05, 2009, and will update events since.   DISCHARGE DIAGNOSES:  1. Atrial fibrillation, status post cardioversion, maintaining sinus      rhythm.  2. Acute diastolic congestive heart failure.  3. Poorly controlled diabetes mellitus, complicated by steroids.  4. Acute exacerbation of chronic obstructive pulmonary disease,      improved.  5. Hypoxia on home oxygen.  6. Hypertension.  7. Tobacco abuse.   DISCHARGE MEDICATIONS:  1. Actos 45 mg p.o. daily.  2. Diltiazem reduced to 120 mg p.o. daily.  3. Diovan 80 mg p.o. daily.  4. Glyburide 3 mg p.o. daily.  5. ProAir HFA one to two puffs inhaled q.i.d.  6. Symbicort 80/4.5, two puffs inhaled b.i.d.  7. Vytorin 10/40 one p.o. daily.  8. Potassium chloride 40 mEq p.o. daily.  9. Nicotine patch 21 mg per 24 hours patch one daily for 4 weeks then      taper.  10.Lantus 10 units subcutaneously q.h.s.  11.NovoLog sliding scale insulin per directions.  Resistant scale      prescribed.  12.Enteric coated aspirin 81 mg p.o. daily.  13.Prednisone taper as per directions.  14.Lasix 40 mg p.o. daily.  15.Oxygen via nasal cannula at 1-2 liters per minute.  16.Coumadin 5 mg tablet, 1-1/2 tablets at 6 p.m. on January 08, 2009,      then 1 tablet at 6 p.m. from January 09, 2009, until the patient      has a repeat INR on January 11, 2009.  Further dose  adjustment      based on that INR check relayed to [facility].  17.The patient is on multiple vitamins including vitamin A, magnesium,      calcium, B12, vitamin C and D and zinc sulfate prior to admission      which she can continue.   DISCONTINUED MEDICATION:  Triamterine/hydrochlorothiazide.   PROCEDURES:  Since February 9, none.   PERTINENT LABS:  Basic metabolic panel on February 12, sodium 149,  potassium 4.3, chloride 94, bicarb 36, glucose 103, BUN 21, creatinine  0.94, calcium 10.  INR 1.5.  CBC, hemoglobin 13.5, hematocrit 39.7,  Lupien blood cell 12, platelets 266.   CONSULTATIONS:  Cardiology, Dr. Shirlee Latch.   HOSPITAL COURSE AND PATIENT DISPOSITION:  Since February 9, the patient  has been status post DCCV for atrial fibrillation and is maintaining  sinus rhythm.  The patient was on both IV heparin and Coumadin.  Once  her INR had reached therapeutic range yesterday her heparin had been  stopped.  However, the patient was noted to be volume overloaded with  acute diastolic congestive heart failure.  Cardiology continued to  follow her.  She was switched briefly to IV Lasix with good diuresis.  Cardiology has seen her again today and have cleared her for discharge  home on Lasix and other cardiac medications.  However, yesterday on room  air she dropped her oxygen saturation to 84% after walking.  Hence will  require home O2.  Her INR today has dropped compared to yesterday's 2 to  1.5.  I have discussed with pharmacy who recommend increasing her  Coumadin for today to 7.5 and then leaving her on 5 mg daily until INR  check on January 11, 2009.  The patient's diabetes is poorly controlled  most likely secondary to the steroids which were added for acute  exacerbation of COPD.  The steroids have been rapidly tapered.  Suspect  that once her steroids are off her insulin requirements will decrease.  The insulin is to be duly adjusted based on that and may even  consider  discontinuing them.  The patient has been counseled regarding insulin  administration, hypoglycemic symptoms and management and CBG checks.  She has been counseled regarding tobacco cessation.  She had significant  edema of her lower extremities which is progressively decreasing.  She  also had leukocytosis secondary to steroids.  The patient has a splint  on her left forearm and is to call her primary orthopedic MD for  followup.  At this time the patient is stable to be discharged.  She is  to follow up with Dr. Shirlee Latch, phone number [phone].  She has an  appointment for January 20, 2009, at 3 p.m.  She is also to call her  primary MD for a followup appointment and her primary orthopedic MD for  an appointment.  Home health has been arranged for PT/oxygen/RN for CHF  education and blood draws for INR.   Time spent on discharge is 40 minutes.      Marcellus Scott, MD  Electronically Signed     AH/MEDQ  D:  01/08/2009  T:  01/08/2009  Job:  [id number]   cc:   Lubertha Basque. Jerl Santos, M.D.  Evelena Peat, M.D.  Marca Ancona, MD

## 2011-04-11 NOTE — Assessment & Plan Note (Signed)
[facility]                            CARDIOLOGY OFFICE NOTE   NAME:Paige Hardin, Paige Hardin                         MRN:          [id number]  DATE:01/20/2009                            DOB:          01-08-1946    PRIMARY CARE PHYSICIAN:  Evelena Peat, MD   HISTORY OF PRESENT ILLNESS:  This is a 65 year old with a history of  COPD, diastolic congestive heart failure, and paroxysmal atrial  fibrillation who presents to Cardiology Clinic for followup after recent  hospitalization.  The patient was hospitalized in early February 2010  with COPD exacerbation.  While in the hospital, she developed atrial  fibrillation with rapid ventricular response and also had a diastolic  congestive heart failure exacerbation.  Her atrial fibrillation was  difficult to control.  We ended up having to DC cardiovert her.  Once  cardioverted, however, she maintained sinus rhythm.  We did diuresis her  with Lasix while she was in the hospital.  She had an excellent weight  loss.  By the time of discharge, she was feeling better.  Since  discharge, she has been doing relatively well.  She states she was out  yesterday walking around in a store without any problems.  She is  actually not using her home O2 now.  Her oxygen saturation was 94% on  room air when checked by her nurse at home.  She can walk slowly on flat  ground with no shortness of breath.  If she rushes or tries to climb  steps, she does become short of breath.  She has had no lightheadedness  or palpitations.  She has had no episodes of chest pain.  She states her  blood sugars have been running high ever since she was in the hospital.  She has been following up with Dr. Caryl Never for that.   PAST MEDICAL HISTORY:  1. Chronic low back pain.  2. COPD.  The patient has been on home oxygen in the past.  However,      she is not currently using it.  3. Type 2 diabetes.  4. Paroxysmal atrial fibrillation.  The patient's  first episode was      about 3 years ago, second episode was in February 2010 while in the      hospital for COPD exacerbation.  The patient ended up getting      direct current cardioversion and she is now on Coumadin.  5. Hyperlipidemia.  6. Diastolic congestive heart failure.  Echocardiogram in February      2010 showed an EF of 70-75% with normal RV size and function.  7. Hypertension.  8. Smoking.  The patient did quit smoking while she was in the      hospital in February 2010.   SOCIAL HISTORY:  The patient is still staying with her son.  She is a  Teaching laboratory technician.  She has a 60-pack-year history of tobacco,  however, she quit in February 2010.  She does not drink alcohol.   FAMILY HISTORY:  Non-Hodgkin lymphoma and breast cancer.  No early-onset  coronary artery disease.   REVIEW OF SYSTEMS:  Negative except as noted in the history of present  illness.   EKG was reviewed today.  She has normal sinus rhythm.  This is a normal  EKG.   MEDICATIONS:  1. Symbicort inhaler b.i.d.  2. Lantus.  3. NovoLog.  4. Lasix 40 mg daily.  5. Potassium chloride 40 mEq daily.  6. Warfarin.  7. Vytorin 10/40 daily.  8. Diltiazem CD 120 mg daily.  9. Actos 45 mg daily.  10.Glyburide 3 mg daily.   LABORATORY DATA:  Most recent labs from February 2010, creatinine 0.94,  TSH normal.   PHYSICAL EXAMINATION:  VITAL SIGNS:  Blood pressure 110/60, heart rate  76 and regular, and weight is 233 pounds.  GENERAL:  This is an obese female in no apparent distress.  NEUROLOGIC:  She is alert and oriented x3.  Normal affect.  LUNGS:  Essentially clear with some slight rhonchi.  NECK:  JVP is about 8 cm in water.  There is no thyromegaly or thyroid  nodule.  HEENT:  Normal exam.  CARDIOVASCULAR:  Heart, regular.  S1 and S2.  No S3.  Soft S4.  There is  a 1/6 early systolic ejection time murmur at the right upper sternal  border.  There is 1+ peripheral edema about half way to the knees   bilaterally.  There are 2+ posterior tibial pulses bilaterally and there  is no carotid bruit.  ABDOMEN:  Obese, soft, and nontender.  No hepatosplenomegaly.  EXTREMITIES:  No clubbing or cyanosis.  SKIN:  Normal.  MUSCULOSKELETAL:  Normal.   ASSESSMENT AND PLAN:  This is a 65 year old with a history of COPD,  diabetes, paroxysmal atrial fibrillation, and diastolic congestive heart  failure who presents to Cardiology Clinic for followup after a recent  hospitalization.  1. Atrial fibrillation.  The patient is maintaining sinus rhythm after      her DC cardioversion in early February.  She is on diltiazem CD 120      mg daily and she is on warfarin.  Her CHADS-2 score is 3 for      hypertension, diabetes, and congestive heart failure.  She is at      relatively high risk of stroke if she were not on warfarin,      therefore, I do think lifelong warfarin is the best strategy for      her.  At a minimal she needs to be on warfarin for a month after      her cardioversion.  2. Congestive heart failure.  The patient is mildly overloaded today.      She is taking her Lasix regularly.  She is not watching her salt      intake. I talked to her for a while today about watching her salt      intake.  She will try to do better with this.  We will continue      current dose of Lasix and potassium.  3. Diabetes.  This is under worse control since she has been on the      hospital and had steroids.  She is following up with Dr. Caryl Never      for this.  She was on Diovan because of her history of hypertension      and diabetes.  I am going to start her back on Diovan 80 mg daily.      We will bring her back in a week or 10 days  to check a CHEM-7 after      starting on Diovan.  4. I will see the patient back in this clinic in about 4 months to see      how she is doing from from an atrial fibrillation and congestive      heart failure standpoint.     Marca Ancona, MD  Electronically  Signed    DM/MedQ  DD: 01/20/2009  DT: 01/21/2009  Job #: [id number]   cc:   Evelena Peat, M.D.

## 2011-04-11 NOTE — Op Note (Signed)
NAMEJAMIELYNN, Paige Hardin                  ACCOUNT NO.:  [id number]   MEDICAL RECORD NO.:  [id number]          PATIENT TYPE:  INP   LOCATION:  2041                         FACILITY:  [facility]   PHYSICIAN:  Marca Ancona, MD      DATE OF BIRTH:  November 10, 1946   DATE OF PROCEDURE:  DATE OF DISCHARGE:                               OPERATIVE REPORT   PROCEDURE:  Direct current cardioversion.   INDICATIONS:  Poorly controlled atrial fibrillation with hypotension and  rapid ventricular response.   PROCEDURE:  The patient was noted to go into atrial fibrillation early  this morning.  Heart rate was difficult to control with diltiazem drip,  Lopressor, and digoxin.  Systolic blood pressure was running in the 90s  with heart rate anywhere from 130s to 160s.  After informed consent was  obtained, the patient was sedated with Pentothal 100 mg IV.  Anesthesia  was present for the procedure.  After full sedation, the patient  underwent synchronized DC cardioversion at 150 joules with restoration  of normal sinus rhythm after the first shock.  There were no  complications.  The patient needs to be therapeutically anticoagulated  with heparin or Lovenox until her INR is greater than 2 on Coumadin.      Marca Ancona, MD  Electronically Signed     DM/MEDQ  D:  01/04/2009  T:  01/05/2009  Job:  [id number]

## 2011-04-21 ENCOUNTER — Encounter: Payer: Self-pay | Admitting: Pulmonary Disease

## 2011-05-03 ENCOUNTER — Ambulatory Visit (INDEPENDENT_AMBULATORY_CARE_PROVIDER_SITE_OTHER): Payer: Medicare Other | Admitting: Pulmonary Disease

## 2011-05-03 ENCOUNTER — Encounter: Payer: Self-pay | Admitting: Pulmonary Disease

## 2011-05-03 ENCOUNTER — Other Ambulatory Visit: Payer: Self-pay | Admitting: Pulmonary Disease

## 2011-05-03 VITALS — BP 124/68 | HR 93 | Temp 97.8°F | Ht 64.0 in | Wt 307.2 lb

## 2011-05-03 DIAGNOSIS — J449 Chronic obstructive pulmonary disease, unspecified: Secondary | ICD-10-CM

## 2011-05-03 NOTE — Patient Instructions (Signed)
Stay on current meds.  If you have to discontinue one of them due to costs, would start with spiriva first. Will see if you qualify for the patient assistance programs thru the drug companies. Work on weight loss followup with me in 6mos.

## 2011-05-03 NOTE — Progress Notes (Signed)
  Subjective:    Patient ID: Paige Hardin, female    DOB: July 15, 1946, 65 y.o.   MRN: [id number]  HPI The pt comes in today for f/u of her known emphysema.  She is taking her meds compliantly, but is concerned about the ongoing costs.  She is not sure she can stay on both inhalers.  She still has doe, but it is at baseline.  She denies any cough or congestion.  She has not had an acute exacerbation or pulmonary infection since the last visit.    Review of Systems  Constitutional: Negative for fever and unexpected weight change.  HENT: Positive for sinus pressure. Negative for ear pain, nosebleeds, congestion, sore throat, rhinorrhea, sneezing, trouble swallowing, dental problem and postnasal drip.   Eyes: Positive for redness and itching.  Respiratory: Positive for chest tightness, shortness of breath and wheezing. Negative for cough.   Cardiovascular: Negative for palpitations and leg swelling.  Gastrointestinal: Negative for nausea and vomiting.  Genitourinary: Negative for dysuria.  Musculoskeletal: Positive for joint swelling.  Skin: Negative for rash.  Neurological: Negative for headaches.  Hematological: Bruises/bleeds easily.  Psychiatric/Behavioral: Negative for dysphoric mood. The patient is not nervous/anxious.        Objective:   Physical Exam Morbidly obese female in nad Chest with faint basilar crackles, mild decrease in BS, no wheezing Cor with rrr, 2/6 sem LE with 1+ edema,no cyanosis Alert, oriented, moves all 4        Assessment & Plan:

## 2011-05-11 ENCOUNTER — Other Ambulatory Visit: Payer: Self-pay | Admitting: Pulmonary Disease

## 2011-05-12 NOTE — Assessment & Plan Note (Signed)
The pt is maintaining a stable baseline from an emphysema standpoint on her current regimen.  I suspect a lot of her doe is more related to her morbid obesity and deconditioning than her lung disease.  I have stressed the importance of weight reduction and a conditioning program.

## 2011-07-09 ENCOUNTER — Other Ambulatory Visit: Payer: Self-pay | Admitting: Cardiology

## 2011-08-08 ENCOUNTER — Other Ambulatory Visit: Payer: Self-pay | Admitting: Cardiology

## 2011-09-19 ENCOUNTER — Ambulatory Visit: Payer: Self-pay | Admitting: Cardiology

## 2011-09-19 DIAGNOSIS — Z7901 Long term (current) use of anticoagulants: Secondary | ICD-10-CM

## 2011-09-19 DIAGNOSIS — Z8679 Personal history of other diseases of the circulatory system: Secondary | ICD-10-CM

## 2011-11-07 ENCOUNTER — Ambulatory Visit (INDEPENDENT_AMBULATORY_CARE_PROVIDER_SITE_OTHER): Payer: Medicare Other | Admitting: Pulmonary Disease

## 2011-11-07 ENCOUNTER — Encounter: Payer: Self-pay | Admitting: Pulmonary Disease

## 2011-11-07 VITALS — BP 122/70 | HR 77 | Temp 97.6°F | Ht 64.0 in | Wt 306.0 lb

## 2011-11-07 DIAGNOSIS — J449 Chronic obstructive pulmonary disease, unspecified: Secondary | ICD-10-CM

## 2011-11-07 MED ORDER — BUDESONIDE-FORMOTEROL FUMARATE 80-4.5 MCG/ACT IN AERO: 2.0000 | INHALATION_SPRAY | Freq: Two times a day (BID) | RESPIRATORY_TRACT | Status: DC

## 2011-11-07 MED ORDER — TIOTROPIUM BROMIDE MONOHYDRATE 18 MCG IN CAPS: 18.0000 ug | ORAL_CAPSULE | Freq: Every day | RESPIRATORY_TRACT | Status: DC

## 2011-11-07 MED ORDER — ALBUTEROL SULFATE HFA 108 (90 BASE) MCG/ACT IN AERS: 2.0000 | INHALATION_SPRAY | Freq: Four times a day (QID) | RESPIRATORY_TRACT | Status: DC | PRN

## 2011-11-07 NOTE — Patient Instructions (Addendum)
Continue current breathing medications Work on weight loss as much as you can.  Can try chlorpheniramine 8mg  at bedtime to see if helps with postnasal drip, hoarseness, and upper airway cough.  followup with me in 6mos.

## 2011-11-07 NOTE — Assessment & Plan Note (Signed)
The patient seems to be stable from a pulmonary standpoint, but clearly has significant debility and unsteadiness that contributes to her dyspnea on exertion.  Her morbid obesity is playing a role as well.  She did not desaturate in the office with one lap, and was unable to go any further due to weakness and fatigue.  I have asked her to continue on her current bronchodilator regimen, and to work on weight loss as much as she can.

## 2011-11-07 NOTE — Progress Notes (Signed)
  Subjective:    Patient ID: Paige Hardin, female    DOB: [birth year], 65 y.o.   MRN: [id number]  HPI The pt comes in today for f/u of her known copd.  She also has significant debility and morbid obesity that contributes to her doe.  She has not had a definite chest infection or acute exacerbations since her last visit with me.  She is having some hoarseness along with postnasal drip, and has been trying to keep her mouth rinsed as much as possible after using her inhalers.  She feels her dyspnea on exertion is near her usual baseline.   Review of Systems  Constitutional: Negative for fever and unexpected weight change.  HENT: Negative for ear pain, nosebleeds, congestion, sore throat, rhinorrhea, sneezing, trouble swallowing, dental problem, postnasal drip and sinus pressure.   Eyes: Negative for redness and itching.  Respiratory: Positive for cough and shortness of breath. Negative for chest tightness and wheezing.   Cardiovascular: Positive for leg swelling. Negative for palpitations.  Gastrointestinal: Negative for nausea and vomiting.  Genitourinary: Negative for dysuria.  Musculoskeletal: Negative for joint swelling.  Skin: Negative for rash.  Neurological: Negative for headaches.  Hematological: Does not bruise/bleed easily.  Psychiatric/Behavioral: Negative for dysphoric mood. The patient is not nervous/anxious.        Objective:   Physical Exam Obese female in nad Nose without purulence or discharge Chest with decreased bs, but no wheezing Cor with rrr, 3/6 sem LE with 2+ edema bilat, no cyanosis Alert and oriented, moves all 4.        Assessment & Plan:

## 2011-11-07 NOTE — Progress Notes (Signed)
Addended by: Salli Quarry on: 11/07/2011 02:25 PM   Modules accepted: Orders

## 2011-11-29 ENCOUNTER — Other Ambulatory Visit: Payer: Self-pay | Admitting: Pulmonary Disease

## 2011-12-06 ENCOUNTER — Other Ambulatory Visit: Payer: Self-pay | Admitting: *Deleted

## 2011-12-06 MED ORDER — DABIGATRAN ETEXILATE MESYLATE 150 MG PO CAPS: 150.0000 mg | ORAL_CAPSULE | Freq: Two times a day (BID) | ORAL | Status: DC

## 2011-12-07 ENCOUNTER — Other Ambulatory Visit: Payer: Self-pay | Admitting: Cardiology

## 2011-12-07 ENCOUNTER — Other Ambulatory Visit: Payer: Self-pay | Admitting: Pulmonary Disease

## 2011-12-19 DIAGNOSIS — E7800 Pure hypercholesterolemia, unspecified: Secondary | ICD-10-CM | POA: Diagnosis not present

## 2011-12-19 DIAGNOSIS — E669 Obesity, unspecified: Secondary | ICD-10-CM | POA: Diagnosis not present

## 2011-12-19 DIAGNOSIS — J449 Chronic obstructive pulmonary disease, unspecified: Secondary | ICD-10-CM | POA: Diagnosis not present

## 2011-12-19 DIAGNOSIS — E119 Type 2 diabetes mellitus without complications: Secondary | ICD-10-CM | POA: Diagnosis not present

## 2011-12-19 DIAGNOSIS — I10 Essential (primary) hypertension: Secondary | ICD-10-CM | POA: Diagnosis not present

## 2011-12-27 DIAGNOSIS — D649 Anemia, unspecified: Secondary | ICD-10-CM | POA: Diagnosis not present

## 2012-01-12 ENCOUNTER — Telehealth: Payer: Self-pay | Admitting: Cardiology

## 2012-01-12 NOTE — Telephone Encounter (Signed)
PT IS SCHEDULED TO HAVE A COLONOSCOPY NEXT Friday 01/22/12 AND NEEDS TO STOP PRODAXA 5 DAYS BEFORE

## 2012-01-12 NOTE — Telephone Encounter (Signed)
Will forward for dr mclean's review. Looks like the last time the pt was seen was 2011.

## 2012-01-12 NOTE — Telephone Encounter (Signed)
No need to stop that many days prior, 2 is sufficient.

## 2012-01-15 NOTE — Telephone Encounter (Signed)
I will forward to Dr Jeani Hawking.

## 2012-01-26 ENCOUNTER — Ambulatory Visit (HOSPITAL_COMMUNITY)
Admission: RE | Admit: 2012-01-26 | Discharge: 2012-01-26 | Disposition: A | Discharge status: Home / Self Care | Payer: Medicare Other | Source: Ambulatory Visit | Attending: Gastroenterology | Admitting: Gastroenterology

## 2012-01-26 ENCOUNTER — Encounter (HOSPITAL_COMMUNITY)
Admission: RE | Disposition: A | Discharge status: Home / Self Care | Payer: Self-pay | Source: Ambulatory Visit | Attending: Gastroenterology

## 2012-01-26 ENCOUNTER — Encounter (HOSPITAL_COMMUNITY): Payer: Self-pay | Admitting: Gastroenterology

## 2012-01-26 DIAGNOSIS — B3781 Candidal esophagitis: Secondary | ICD-10-CM | POA: Diagnosis not present

## 2012-01-26 DIAGNOSIS — I5030 Unspecified diastolic (congestive) heart failure: Secondary | ICD-10-CM | POA: Insufficient documentation

## 2012-01-26 DIAGNOSIS — E119 Type 2 diabetes mellitus without complications: Secondary | ICD-10-CM | POA: Insufficient documentation

## 2012-01-26 DIAGNOSIS — I4891 Unspecified atrial fibrillation: Secondary | ICD-10-CM | POA: Insufficient documentation

## 2012-01-26 DIAGNOSIS — J438 Other emphysema: Secondary | ICD-10-CM | POA: Diagnosis not present

## 2012-01-26 DIAGNOSIS — E669 Obesity, unspecified: Secondary | ICD-10-CM | POA: Diagnosis not present

## 2012-01-26 DIAGNOSIS — I10 Essential (primary) hypertension: Secondary | ICD-10-CM | POA: Diagnosis not present

## 2012-01-26 DIAGNOSIS — I6529 Occlusion and stenosis of unspecified carotid artery: Secondary | ICD-10-CM | POA: Insufficient documentation

## 2012-01-26 DIAGNOSIS — K648 Other hemorrhoids: Secondary | ICD-10-CM | POA: Insufficient documentation

## 2012-01-26 DIAGNOSIS — D509 Iron deficiency anemia, unspecified: Secondary | ICD-10-CM | POA: Insufficient documentation

## 2012-01-26 DIAGNOSIS — E785 Hyperlipidemia, unspecified: Secondary | ICD-10-CM | POA: Diagnosis not present

## 2012-01-26 DIAGNOSIS — Z01812 Encounter for preprocedural laboratory examination: Secondary | ICD-10-CM | POA: Insufficient documentation

## 2012-01-26 DIAGNOSIS — D126 Benign neoplasm of colon, unspecified: Secondary | ICD-10-CM | POA: Diagnosis not present

## 2012-01-26 DIAGNOSIS — K5730 Diverticulosis of large intestine without perforation or abscess without bleeding: Secondary | ICD-10-CM | POA: Insufficient documentation

## 2012-01-26 DIAGNOSIS — K644 Residual hemorrhoidal skin tags: Secondary | ICD-10-CM | POA: Diagnosis not present

## 2012-01-26 DIAGNOSIS — M545 Low back pain, unspecified: Secondary | ICD-10-CM | POA: Insufficient documentation

## 2012-01-26 HISTORY — PX: COLONOSCOPY: SHX5424

## 2012-01-26 HISTORY — PX: ESOPHAGOGASTRODUODENOSCOPY: SHX5428

## 2012-01-26 SURGERY — EGD (ESOPHAGOGASTRODUODENOSCOPY)
Anesthesia: Moderate Sedation

## 2012-01-26 MED ORDER — MIDAZOLAM HCL 10 MG/2ML IJ SOLN
INTRAMUSCULAR | Status: AC
  Filled 2012-01-26: qty 2

## 2012-01-26 MED ORDER — MIDAZOLAM HCL 10 MG/2ML IJ SOLN
INTRAMUSCULAR | Status: DC | PRN
  Administered 2012-01-26: 1 mg via INTRAVENOUS
  Administered 2012-01-26 (×3): 2 mg via INTRAVENOUS
  Administered 2012-01-26: 1 mg via INTRAVENOUS
  Administered 2012-01-26: 2 mg via INTRAVENOUS

## 2012-01-26 MED ORDER — FENTANYL NICU IV SYRINGE 50 MCG/ML
INJECTION | INTRAMUSCULAR | Status: DC | PRN
  Administered 2012-01-26 (×4): 25 ug via INTRAVENOUS

## 2012-01-26 MED ORDER — FENTANYL CITRATE 0.05 MG/ML IJ SOLN
INTRAMUSCULAR | Status: AC
  Filled 2012-01-26: qty 2

## 2012-01-26 MED ORDER — BUTAMBEN-TETRACAINE-BENZOCAINE 2-2-14 % EX AERO
INHALATION_SPRAY | CUTANEOUS | Status: DC | PRN
  Administered 2012-01-26: 1 via TOPICAL

## 2012-01-26 MED ORDER — SODIUM CHLORIDE 0.9 % IV SOLN
Freq: Once | INTRAVENOUS | Status: AC
  Administered 2012-01-26: 10:00:00 via INTRAVENOUS

## 2012-01-26 NOTE — Op Note (Signed)
[facility] [address] [place], [place]  [address]  OPERATIVE PROCEDURE REPORT  PATIENT:  Paige, Hardin  MR#:  [id number] BIRTHDATE:  March 18, 1946  GENDER:  female ENDOSCOPIST:  Jeani Hawking, MD ASSISTANT:  Dwain Sarna, RN CGRN and Windell Hummingbird, Technician PROCEDURE DATE:  01/26/2012 PROCEDURE:  EGD, diagnostic [phone] ASA CLASS:  Class III INDICATIONS:  Iron Deficiency Anemia MEDICATIONS:  Fentanyl 50 mcg IV, Versed 5 mg IV TOPICAL ANESTHETIC:  Cetacaine Spray  DESCRIPTION OF PROCEDURE:   After the risks benefits and alternatives of the procedure were thoroughly explained, informed consent was obtained.  The EG-[id number] ([id number]) endoscope was introduced through the mouth and advanced to the second portion of the duodenum, without limitations.  The instrument was slowly withdrawn as the mucosa was fully examined. <<PROCEDUREIMAGES>>  FINDINGS:  A moderate Candidal esophagitis was identified. In the gastric body there was evidence of hematin. No overt AVMs or erosions, but it was clear that the hematin was coming from the very small punctate sources in the body and fundus. No lesions to ablate. No duodenal abnormalities.    Retroflexed views revealed no abnormalities.    The scope was then withdrawn from the patient and the procedure terminated.  COMPLICATIONS:  None  IMPRESSION:  1) Candida esophagitis 2) Hematin RECOMMENDATIONS:  1) Trial of a PPI. 2) Follow up in the office in 3 months to repeat bloodwork.  ______________________________ Jeani Hawking, MD  n. Rosalie DoctorJeani Hawking at 01/26/2012 10:58 AM  Ralph Dowdy, [phone]

## 2012-01-26 NOTE — Interval H&P Note (Signed)
History and Physical Interval Note:  01/26/2012 10:34 AM  Paige Hardin  has presented today for surgery, with the diagnosis of IDA  The various methods of treatment have been discussed with the patient and family. After consideration of risks, benefits and other options for treatment, the patient has consented to  Procedure(s) (LRB): ESOPHAGOGASTRODUODENOSCOPY (EGD) (N/A) COLONOSCOPY (N/A) as a surgical intervention .  The patients' history has been reviewed, patient examined, no change in status, stable for surgery.  I have reviewed the patients' chart and labs.  Questions were answered to the patient's satisfaction.     Fines Kimberlin D

## 2012-01-26 NOTE — Discharge Instructions (Signed)
Endoscopy Care After Please read the instructions outlined below and refer to this sheet in the next few weeks. These discharge instructions provide you with general information on caring for yourself after you leave the hospital. Your doctor may also give you specific instructions. While your treatment has been planned according to the most current medical practices available, unavoidable complications occasionally occur. If you have any problems or questions after discharge, please call your doctor. HOME CARE INSTRUCTIONS Activity  You may resume your regular activity but move at a slower pace for the next 24 hours.   Take frequent rest periods for the next 24 hours.   Walking will help expel (get rid of) the air and reduce the bloated feeling in your abdomen.   No driving for 24 hours (because of the anesthesia (medicine) used during the test).   You may shower.   Do not sign any important legal documents or operate any machinery for 24 hours (because of the anesthesia used during the test).  Nutrition  Drink plenty of fluids.   You may resume your normal diet.   Begin with a light meal and progress to your normal diet.   Avoid alcoholic beverages for 24 hours or as instructed by your caregiver.  Medications You may resume your normal medications unless your caregiver tells you otherwise. What you can expect today  You may experience abdominal discomfort such as a feeling of fullness or "gas" pains.   You may experience a sore throat for 2 to 3 days. This is normal. Gargling with salt water may help this.  Follow-up Your doctor will discuss the results of your test with you. SEEK IMMEDIATE MEDICAL CARE IF:  You have excessive nausea (feeling sick to your stomach) and/or vomiting.   You have severe abdominal pain and distention (swelling).   You have trouble swallowing.   You have a temperature over 100 F (37.8 C).   You have rectal bleeding or vomiting of blood.    Document Released: 06/27/2004 Document Revised: 07/26/2011 Document Reviewed: 01/08/2008 [facility] Patient Information 2012 [place], [place].   Colonoscopy Care After Read the instructions outlined below and refer to this sheet in the next few weeks. These discharge instructions provide you with general information on caring for yourself after you leave the hospital. Your doctor may also give you specific instructions. While your treatment has been planned according to the most current medical practices available, unavoidable complications occasionally occur. If you have any problems or questions after discharge, call your doctor. HOME CARE INSTRUCTIONS ACTIVITY:  You may resume your regular activity, but move at a slower pace for the next 24 hours.   Take frequent rest periods for the next 24 hours.   Walking will help get rid of the air and reduce the bloated feeling in your belly (abdomen).   No driving for 24 hours (because of the medicine (anesthesia) used during the test).   You may shower.   Do not sign any important legal documents or operate any machinery for 24 hours (because of the anesthesia used during the test).  NUTRITION:  Drink plenty of fluids.   You may resume your normal diet as instructed by your doctor.   Begin with a light meal and progress to your normal diet. Heavy or fried foods are harder to digest and may make you feel sick to your stomach (nauseated).   Avoid alcoholic beverages for 24 hours or as instructed.  MEDICATIONS:  You may resume your normal medications unless  your doctor tells you otherwise.  WHAT TO EXPECT TODAY:  Some feelings of bloating in the abdomen.   Passage of more gas than usual.   Spotting of blood in your stool or on the toilet paper.  IF YOU HAD POLYPS REMOVED DURING THE COLONOSCOPY:  No aspirin products for 7 days or as instructed.   No alcohol for 7 days or as instructed.   Eat a soft diet for the next 24 hours.   FINDING OUT THE RESULTS OF YOUR TEST Not all test results are available during your visit. If your test results are not back during the visit, make an appointment with your caregiver to find out the results. Do not assume everything is normal if you have not heard from your caregiver or the medical facility. It is important for you to follow up on all of your test results.  SEEK IMMEDIATE MEDICAL CARE IF:  You have more than a spotting of blood in your stool.   Your belly is swollen (abdominal distention).   You are nauseated or vomiting.   You have a fever.   You have abdominal pain or discomfort that is severe or gets worse throughout the day.  Document Released: 06/27/2004 Document Revised: 07/26/2011 Document Reviewed: 06/25/2008 [facility] Patient Information 2012 [place], [place].  Colon Polyps A polyp is extra tissue that grows inside your body. Colon polyps grow in the large intestine. The large intestine, also called the colon, is part of your digestive system. It is a long, hollow tube at the end of your digestive tract where your body makes and stores stool. Most polyps are not dangerous. They are benign. This means they are not cancerous. But over time, some types of polyps can turn into cancer. Polyps that are smaller than a pea are usually not harmful. But larger polyps could someday become or may already be cancerous. To be safe, doctors remove all polyps and test them.  WHO GETS POLYPS? Anyone can get polyps, but certain people are more likely than others. You may have a greater chance of getting polyps if:  You are over 50.   You have had polyps before.   Someone in your family has had polyps.   Someone in your family has had cancer of the large intestine.   Find out if someone in your family has had polyps. You may also be more likely to get polyps if you:   Eat a lot of fatty foods.   Smoke.   Drink alcohol.   Do not exercise.   Eat too much.  SYMPTOMS   Most small polyps do not cause symptoms. People often do not know they have one until their caregiver finds it during a regular checkup or while testing them for something else. Some people do have symptoms like these:  Bleeding from the anus. You might notice blood on your underwear or on toilet paper after you have had a bowel movement.   Constipation or diarrhea that lasts more than a week.   Blood in the stool. Blood can make stool look black or it can show up as red streaks in the stool.  If you have any of these symptoms, see your caregiver. HOW DOES THE DOCTOR TEST FOR POLYPS? The doctor can use four tests to check for polyps:  Digital rectal exam. The caregiver wears gloves and checks your rectum (the last part of the large intestine) to see if it feels normal. This test would find polyps only in the  rectum. Your caregiver may need to do one of the other tests listed below to find polyps higher up in the intestine.   Barium enema. The caregiver puts a liquid called barium into your rectum before taking x-rays of your large intestine. Barium makes your intestine look Hamberger in the pictures. Polyps are dark, so they are easy to see.   Sigmoidoscopy. With this test, the caregiver can see inside your large intestine. A thin flexible tube is placed into your rectum. The device is called a sigmoidoscope, which has a light and a tiny video camera in it. The caregiver uses the sigmoidoscope to look at the last third of your large intestine.   Colonoscopy. This test is like sigmoidoscopy, but the caregiver looks at all of the large intestine. It usually requires sedation. This is the most common method for finding and removing polyps.  TREATMENT   The caregiver will remove the polyp during sigmoidoscopy or colonoscopy. The polyp is then tested for cancer.   If you have had polyps, your caregiver may want you to get tested regularly in the future.  PREVENTION  There is not one sure way to  prevent polyps. You might be able to lower your risk of getting them if you:  Eat more fruits and vegetables and less fatty food.   Do not smoke.   Avoid alcohol.   Exercise every day.   Lose weight if you are overweight.   Eating more calcium and folate can also lower your risk of getting polyps. Some foods that are rich in calcium are milk, cheese, and broccoli. Some foods that are rich in folate are chickpeas, kidney beans, and spinach.   Aspirin might help prevent polyps. Studies are under way.  Document Released: 08/09/2004 Document Revised: 07/26/2011 Document Reviewed: 01/15/2008 [facility] Patient Information 2012 [place], [place].

## 2012-01-26 NOTE — Op Note (Signed)
[facility] [address] [place], [place]  [address]  OPERATIVE PROCEDURE REPORT  PATIENT:  Paige, Hardin  MR#:  [id number] BIRTHDATE:  1946-08-15  GENDER:  female ENDOSCOPIST:  Jeani Hawking, MD ASSISTANT:  Dwain Sarna, RN CGRN and Windell Hummingbird, Technician PROCEDURE DATE:  01/26/2012 PROCEDURE:  Colonoscopy with snare polypectomy ASA CLASS:  Class III INDICATIONS:  Personal history of polyps MEDICATIONS:  Fentanyl 50 mcg IV, Versed 5 mg IV DESCRIPTION OF PROCEDURE:   After the risks benefits and alternatives of the procedure were thoroughly explained, informed consent was obtained.  Digital rectal exam was performed and revealed no abnormalities.   The EC-[id number] ([id number]) endoscope was introduced through the anus and advanced to the cecum, which was identified by both the appendix and ileocecal valve, without limitations.  The quality of the prep was excellent..  The instrument was then slowly withdrawn as the colon was fully examined.<<PROCEDUREIMAGES>> FINDINGS:  Multiple 3 mm sessile polyps were identified in the colon as noted in the images. All the polyps were removed with a cold snare. Scattered sigmoid diverticula were identified. The patient had a difficult time tolerating the procedure, because of pain with scope insertion.   Retroflexed views in the rectum revealed internal and external hemorrhoids.    The scope was then withdrawn from the patient and the procedure terminated. COMPLICATIONS:  None IMPRESSION:  1) Polyps, multiple 2) Internal and external hemorrhoids 3) Diverticula RECOMMENDATIONS:  1) Await biopsy results 2) Repeat colonoscopy in 3 years. ______________________________ Jeani Hawking, MD  n. Rosalie DoctorJeani Hawking at 01/26/2012 11:29 AM  Ralph Dowdy, [phone]

## 2012-01-26 NOTE — H&P (Signed)
  Reason for Consult: Anemia Referring Physician: Marjory Lies, M.D.  Paige Hardin HPI: This is a 66 year old female who was evaluated in the office for a progressive anemia over the past couple of years.  She reports that she had an HGB of 14 g/dL two years ago and then it has slowly declined.  She denies any history of melena or hematochezia.  In 06/2006 she was noted to have multiple (8-9) polyps that were adenomas.  She also has a family history of colon cancer in a niece at the age of 24.  The patient was recommended to have a follow up colonoscopy in one year, i.e.,, 2008, but she states that she did not want to repeat it at that time.  She had a hard time with the procedure initially.  Past Medical History  Diagnosis Date  . Chronic low back pain   . Emphysema   . Type 2 diabetes mellitus   . Paroxysmal atrial fibrillation   . COPD (chronic obstructive pulmonary disease)   . Hyperlipidemia   . Diastolic congestive heart failure   . Hypertension   . Obese   . Carotid atherosclerosis   . Lung nodules   . Hoarseness, chronic     Past Surgical History  Procedure Date  . Vaginal hysterectomy   . Left side breast lumpectomy     Family History  Problem Relation Age of Onset  . Breast cancer Sister   . Heart disease    . Hodgkin's lymphoma    . Asthma Mother     Social History:  reports that she quit smoking about 2 years ago. Her smoking use included Cigarettes. She has a 135 pack-year smoking history. She does not have any smokeless tobacco history on file. She reports that she does not drink alcohol or use illicit drugs.  Allergies:  Allergies  Allergen Reactions  . Ace Inhibitors     REACTION: cough  . Clindamycin     REACTION: rash    Medications:  Scheduled:   . sodium chloride   Intravenous Once   Continuous:   Results for orders placed during the hospital encounter of 01/26/12 (from the past 24 hour(s))  GLUCOSE, CAPILLARY     Status: Normal   Collection  Time   01/26/12 10:12 AM      Component Value Range   Glucose-Capillary 91  70 - 99 (mg/dL)     No results found.  ROS:  As stated above in the HPI otherwise negative.  Blood pressure 124/96, temperature 97.5 F (36.4 C), temperature source Oral, resp. rate 19, height 5\' 5"  (1.651 m), weight 141.522 kg (312 lb), SpO2 92.00%.    PE: Gen: NAD, Alert and Oriented HEENT:  [place]/AT, EOMI Neck: Supple, no LAD Lungs: CTA Bilaterally CV: RRR without M/G/R ABM: Soft, NTND, +BS Ext: No C/C/E  Assessment/Plan: 1) Anemia  Plan: 1) EGD/Colonoscopy.  Sota Hetz D 01/26/2012, 10:29 AM

## 2012-01-29 ENCOUNTER — Encounter (HOSPITAL_COMMUNITY): Payer: Self-pay | Admitting: Gastroenterology

## 2012-05-07 ENCOUNTER — Encounter: Payer: Self-pay | Admitting: Pulmonary Disease

## 2012-05-07 ENCOUNTER — Ambulatory Visit (INDEPENDENT_AMBULATORY_CARE_PROVIDER_SITE_OTHER): Payer: Medicare Other | Admitting: Pulmonary Disease

## 2012-05-07 VITALS — BP 118/72 | HR 87 | Temp 97.9°F | Ht 64.0 in | Wt 294.0 lb

## 2012-05-07 DIAGNOSIS — J449 Chronic obstructive pulmonary disease, unspecified: Secondary | ICD-10-CM | POA: Diagnosis not present

## 2012-05-07 NOTE — Assessment & Plan Note (Signed)
The patient has known moderate COPD, and has been doing fairly well from a pulmonary standpoint on her current bronchodilator regimen.  A lot of her shortness of breath is related to her morbid obesity and debility, as well as her heart issues.  I have asked her to continue on her current inhaler regimen, and to try and sleep more upright to see if that will help her breathing.  I have also encouraged her to work aggressively on weight loss.

## 2012-05-07 NOTE — Patient Instructions (Signed)
Continue on current breathing medications.  Keep mouth rinsed well. Work on weight loss and staying active. Can try to sleep more upright on pillows to see if helps your breathing at night. followup with me in 6mos.

## 2012-05-07 NOTE — Progress Notes (Signed)
  Subjective:    Patient ID: Paige Hardin, female    DOB: [birth year], 66 y.o.   MRN: [id number]  HPI The patient comes in today for followup of her moderate COPD.  She has significant dyspnea on exertion chronically that is multifactorial, and related to her COPD, heart disease, as well as obesity with deconditioning.  She has not had a pulmonary infection or COPD exacerbation since her last visit, but has had increasing shortness of breath when lying down at night.  She does not feel that her edema is any worse than in the past.  She is staying on her current bronchodilator regimen compliantly.   Review of Systems  Constitutional: Negative.  Negative for fever and unexpected weight change.  HENT: Positive for voice change and postnasal drip. Negative for ear pain, nosebleeds, congestion, sore throat, rhinorrhea, sneezing, trouble swallowing, dental problem and sinus pressure.   Eyes: Negative.  Negative for redness and itching.  Respiratory: Positive for cough and shortness of breath. Negative for chest tightness and wheezing.   Cardiovascular: Positive for leg swelling. Negative for palpitations.  Gastrointestinal: Negative.  Negative for nausea and vomiting.  Genitourinary: Negative.  Negative for dysuria.  Musculoskeletal: Negative.  Negative for joint swelling.  Skin: Negative.  Negative for rash.  Neurological: Negative.  Negative for headaches.  Hematological: Negative.  Does not bruise/bleed easily.  Psychiatric/Behavioral: Negative.  Negative for dysphoric mood. The patient is not nervous/anxious.        Objective:   Physical Exam Obese female in no acute distress Nose without purulence or discharge noted Chest is totally clear to auscultation, no wheezes or rhonchi Cardiac exam with regular rhythm, 2/6 systolic murmur Lower extremities with significant edema, no cyanosis Alert and oriented, moves all 4 extremities.       Assessment & Plan:

## 2012-07-19 ENCOUNTER — Other Ambulatory Visit: Payer: Self-pay | Admitting: Pulmonary Disease

## 2012-08-21 ENCOUNTER — Other Ambulatory Visit: Payer: Self-pay | Admitting: Cardiology

## 2012-08-22 NOTE — Telephone Encounter (Signed)
Refilled furosemide one time needs appointment

## 2012-11-01 ENCOUNTER — Other Ambulatory Visit: Payer: Self-pay | Admitting: Cardiology

## 2012-11-05 ENCOUNTER — Ambulatory Visit (INDEPENDENT_AMBULATORY_CARE_PROVIDER_SITE_OTHER): Payer: Medicare Other | Admitting: Pulmonary Disease

## 2012-11-05 ENCOUNTER — Encounter: Payer: Self-pay | Admitting: Pulmonary Disease

## 2012-11-05 VITALS — BP 108/54 | HR 82 | Temp 97.3°F | Ht 65.5 in | Wt 289.0 lb

## 2012-11-05 DIAGNOSIS — J449 Chronic obstructive pulmonary disease, unspecified: Secondary | ICD-10-CM | POA: Diagnosis not present

## 2012-11-05 NOTE — Patient Instructions (Addendum)
No change in medications for now.  If you see worsening of your breathing, would try increasing the symbicort to the higher strength. Rinse mouth out well after using inhaler, and then gargle with water and swallow to see if helps with hoarseness.  Continue working on weight loss. followup with me in 6mos if doing well.

## 2012-11-05 NOTE — Progress Notes (Signed)
  Subjective:    Patient ID: Paige Hardin, female    DOB: 1946/07/27, 66 y.o.   MRN: [id number]  HPI The patient comes in today for followup of her known COPD.  She's been staying on her maintenance bronchodilator, and has not had an acute exacerbation or pulmonary infections since the last visit.  She continues to have dysphonia, but this was an issue for her even before going on symbicort.  She continues to have issues with orthopnea, and is trying to sleep more upright.     Review of Systems  Constitutional: Negative for fever and unexpected weight change.  HENT: Positive for congestion, rhinorrhea and postnasal drip. Negative for ear pain, nosebleeds, sore throat, sneezing, trouble swallowing, dental problem and sinus pressure. Voice change:  about the same--voice changes began with Symbicort.   Eyes: Negative for redness and itching.  Respiratory: Positive for cough, shortness of breath and wheezing. Negative for chest tightness.   Cardiovascular: Negative for palpitations and leg swelling.  Gastrointestinal: Negative for nausea and vomiting.  Genitourinary: Negative for dysuria.  Musculoskeletal: Negative for joint swelling.  Skin: Negative for rash.  Neurological: Negative for headaches.  Hematological: Does not bruise/bleed easily.  Psychiatric/Behavioral: Positive for dysphoric mood ( with death of son.). The patient is nervous/anxious.        Objective:   Physical Exam Morbidly obese female in no acute distress Nose without purulence or discharge noted Neck without lymphadenopathy or thyromegaly Chest with decreased breath sounds throughout, a few basilar crackles, no wheezing Cardiac exam with regular rate and rhythm Lower extremities with 1+ edema, no cyanosis Alert and oriented, moves all 4 extremities.       Assessment & Plan:

## 2012-11-05 NOTE — Assessment & Plan Note (Signed)
The pt is maintaining her usual baseline, and has not had an acute exacerbation or pulmonary infection on her current regimen.  She is on the lower dose of symbicort, but has no history to suggest this is not treating her copd adequately.  She has chronic dysphonia even before symbicort (had negative ENT eval), but I worry about higher strength making it worse.  Will leave her on her current regimen, and have stressed the importance of maintaining fluid balance and working on weight loss.

## 2012-12-28 ENCOUNTER — Other Ambulatory Visit: Payer: Self-pay | Admitting: Cardiology

## 2013-01-15 ENCOUNTER — Other Ambulatory Visit: Payer: Self-pay | Admitting: Pulmonary Disease

## 2013-01-24 ENCOUNTER — Other Ambulatory Visit: Payer: Self-pay | Admitting: Cardiology

## 2013-01-28 ENCOUNTER — Telehealth: Payer: Self-pay | Admitting: *Deleted

## 2013-01-28 NOTE — Telephone Encounter (Signed)
-----   Message from Micki Riley sent at 01/28/2013 5:03 PM ----- What should we do? She has not been seen in over 3 yrs and i've tried to call her to schedule appointments but there is never an answer nor answering machine. Micki Riley, CMA  I received this message about refills for furosemide and pradaxa. Pt was last seen by Dr Shirlee Latch 09/26/10. Pt was changed from warfarin to pradaxa by   [facility] pharmacist 03/16/2011 after discussing with Dr Shirlee Latch.  I cannot find a record of patient seeing Dr Shirlee Latch since 09/26/10.  I attempted to contact patient and did not get an answer. I spoke with Marcelino Duster and Olegario Messier at [facility] in [place] to get more information about these medication refills. Their records indicate these may have been filled by Dr Fuller Mandril in the recent past.   They were going to contact patient. I asked Olegario Messier to ask pt to contact our office if she wants Dr Shirlee Latch to continue to fill her medications.

## 2013-01-29 NOTE — Telephone Encounter (Signed)
Called patient and tried to schedule an appointment. She said she was not able to come the times he had open so she will see if her PCP will refill these medications. She did not realize how long it has been since she last seen Dr Shirlee Latch but states she is having no problems.   Micki Riley, CMA

## 2013-01-29 NOTE — Telephone Encounter (Signed)
See if you can get a number from Dr. Diego Cory office

## 2013-03-05 ENCOUNTER — Encounter: Payer: Self-pay | Admitting: Cardiology

## 2013-03-05 DIAGNOSIS — J449 Chronic obstructive pulmonary disease, unspecified: Secondary | ICD-10-CM | POA: Diagnosis not present

## 2013-03-05 DIAGNOSIS — I5032 Chronic diastolic (congestive) heart failure: Secondary | ICD-10-CM | POA: Diagnosis not present

## 2013-03-05 DIAGNOSIS — E119 Type 2 diabetes mellitus without complications: Secondary | ICD-10-CM | POA: Diagnosis not present

## 2013-03-05 DIAGNOSIS — I10 Essential (primary) hypertension: Secondary | ICD-10-CM | POA: Diagnosis not present

## 2013-03-05 DIAGNOSIS — E669 Obesity, unspecified: Secondary | ICD-10-CM | POA: Diagnosis not present

## 2013-03-05 DIAGNOSIS — E7800 Pure hypercholesterolemia, unspecified: Secondary | ICD-10-CM | POA: Diagnosis not present

## 2013-03-05 DIAGNOSIS — I4891 Unspecified atrial fibrillation: Secondary | ICD-10-CM | POA: Diagnosis not present

## 2013-03-05 DIAGNOSIS — K219 Gastro-esophageal reflux disease without esophagitis: Secondary | ICD-10-CM | POA: Diagnosis not present

## 2013-05-06 ENCOUNTER — Ambulatory Visit: Payer: Medicare Other | Admitting: Pulmonary Disease

## 2013-05-09 ENCOUNTER — Encounter: Payer: Self-pay | Admitting: Pulmonary Disease

## 2013-07-18 ENCOUNTER — Other Ambulatory Visit: Payer: Self-pay | Admitting: Pulmonary Disease

## 2013-08-27 ENCOUNTER — Other Ambulatory Visit: Payer: Self-pay | Admitting: Pulmonary Disease

## 2013-08-29 ENCOUNTER — Telehealth: Payer: Self-pay | Admitting: Pulmonary Disease

## 2013-08-29 MED ORDER — TIOTROPIUM BROMIDE MONOHYDRATE 18 MCG IN CAPS: 18.0000 ug | ORAL_CAPSULE | Freq: Every day | RESPIRATORY_TRACT | Status: DC

## 2013-08-29 NOTE — Telephone Encounter (Signed)
Rx has been sent in. Advised pt that she must keep her appointment in order to receive further refills. She agreed and verbalized understanding.

## 2013-10-07 ENCOUNTER — Ambulatory Visit (INDEPENDENT_AMBULATORY_CARE_PROVIDER_SITE_OTHER): Payer: Medicare Other | Admitting: Pulmonary Disease

## 2013-10-07 ENCOUNTER — Encounter: Payer: Self-pay | Admitting: Pulmonary Disease

## 2013-10-07 VITALS — BP 136/82 | HR 87 | Temp 98.2°F | Ht 65.5 in | Wt 318.4 lb

## 2013-10-07 DIAGNOSIS — Z23 Encounter for immunization: Secondary | ICD-10-CM

## 2013-10-07 DIAGNOSIS — J449 Chronic obstructive pulmonary disease, unspecified: Secondary | ICD-10-CM

## 2013-10-07 NOTE — Assessment & Plan Note (Signed)
The patient has a history of moderate airflow obstruction, and has been on low-dose symbicort with Spiriva.  She now has worsening dyspnea on exertion, but it is unclear how much of this is related to her lung disease versus her obesity versus her chronic diastolic heart failure.  I will increase her symbicort the higher dose, and provided spacer to see if this will help her breathing.  I've also encouraged her to work aggressively on weight loss, and to talk with her primary doctor about possibly increasing her diuretics for her lower extremity edema.  She more than likely qualifies for supplemental oxygen, but she refuses to consider this.

## 2013-10-07 NOTE — Progress Notes (Signed)
  Subjective:    Patient ID: Paige Hardin, female    DOB: Oct 24, 1946, 67 y.o.   MRN: [id number]  HPI Patient comes in today for followup of her known COPD.  She feels that her breathing has worsened with exertional activity since the last visit, but she has also gained 12 pounds.  She denies any significant congestion, but does have a mild cough with mucus that she thinks is from postnasal drip.  She does not have any purulence.  She has stayed on her bronchodilator regimen, but has noted persistent lower extremity edema despite being on diuretics.   Review of Systems  Constitutional: Negative for fever and unexpected weight change.  HENT: Positive for congestion and postnasal drip. Negative for dental problem, ear pain, nosebleeds, rhinorrhea, sinus pressure, sneezing, sore throat and trouble swallowing.   Eyes: Negative for redness and itching.  Respiratory: Positive for cough and shortness of breath. Negative for chest tightness and wheezing.   Cardiovascular: Positive for leg swelling. Negative for palpitations.  Gastrointestinal: Negative for nausea and vomiting.  Genitourinary: Negative for dysuria.  Musculoskeletal: Negative for joint swelling.  Skin: Negative for rash.  Neurological: Negative for headaches.  Hematological: Does not bruise/bleed easily.  Psychiatric/Behavioral: Positive for dysphoric mood. The patient is nervous/anxious.        Objective:   Physical Exam Obese female in no acute distress Nose without purulence or discharge noted Neck without lymphadenopathy or thyromegaly Chest with decreased breath sounds, no active wheezing Cardiac exam with regular rate and rhythm Lower extremities with 2+ edema, no cyanosis Alert and oriented, moves all 4 extremities.       Assessment & Plan:

## 2013-10-07 NOTE — Patient Instructions (Signed)
Will increase symbicort to the 160/4.5 strength.  Take 2 puffs am and pm WITH spacer.  Rinse mouth well.  Consider oxygen therapy, and we can arrange for evaluation. Work on weight loss followup with me in 6mos, but call us if worsening breathing.

## 2013-11-04 ENCOUNTER — Other Ambulatory Visit: Payer: Self-pay | Admitting: Pulmonary Disease

## 2014-01-14 ENCOUNTER — Inpatient Hospital Stay (HOSPITAL_COMMUNITY)
Admission: EM | Admit: 2014-01-14 | Discharge: 2014-01-23 | DRG: 291 | Disposition: A | Discharge status: Skilled Nursing Facility | Payer: Medicare Other | Attending: Internal Medicine | Admitting: Internal Medicine

## 2014-01-14 ENCOUNTER — Encounter (HOSPITAL_COMMUNITY): Payer: Self-pay | Admitting: Emergency Medicine

## 2014-01-14 ENCOUNTER — Emergency Department (HOSPITAL_COMMUNITY): Payer: Medicare Other

## 2014-01-14 DIAGNOSIS — N183 Chronic kidney disease, stage 3 unspecified: Secondary | ICD-10-CM | POA: Diagnosis present

## 2014-01-14 DIAGNOSIS — M545 Low back pain, unspecified: Secondary | ICD-10-CM | POA: Diagnosis present

## 2014-01-14 DIAGNOSIS — I5032 Chronic diastolic (congestive) heart failure: Secondary | ICD-10-CM | POA: Diagnosis present

## 2014-01-14 DIAGNOSIS — Z803 Family history of malignant neoplasm of breast: Secondary | ICD-10-CM

## 2014-01-14 DIAGNOSIS — I5033 Acute on chronic diastolic (congestive) heart failure: Principal | ICD-10-CM

## 2014-01-14 DIAGNOSIS — I2789 Other specified pulmonary heart diseases: Secondary | ICD-10-CM | POA: Diagnosis present

## 2014-01-14 DIAGNOSIS — R279 Unspecified lack of coordination: Secondary | ICD-10-CM | POA: Diagnosis not present

## 2014-01-14 DIAGNOSIS — R262 Difficulty in walking, not elsewhere classified: Secondary | ICD-10-CM | POA: Diagnosis not present

## 2014-01-14 DIAGNOSIS — I272 Pulmonary hypertension, unspecified: Secondary | ICD-10-CM | POA: Diagnosis present

## 2014-01-14 DIAGNOSIS — M6281 Muscle weakness (generalized): Secondary | ICD-10-CM | POA: Diagnosis not present

## 2014-01-14 DIAGNOSIS — Z66 Do not resuscitate: Secondary | ICD-10-CM | POA: Diagnosis present

## 2014-01-14 DIAGNOSIS — J439 Emphysema, unspecified: Secondary | ICD-10-CM | POA: Diagnosis present

## 2014-01-14 DIAGNOSIS — R498 Other voice and resonance disorders: Secondary | ICD-10-CM

## 2014-01-14 DIAGNOSIS — Z6841 Body Mass Index (BMI) 40.0 and over, adult: Secondary | ICD-10-CM | POA: Diagnosis not present

## 2014-01-14 DIAGNOSIS — Z87891 Personal history of nicotine dependence: Secondary | ICD-10-CM | POA: Diagnosis not present

## 2014-01-14 DIAGNOSIS — Z7901 Long term (current) use of anticoagulants: Secondary | ICD-10-CM

## 2014-01-14 DIAGNOSIS — I6529 Occlusion and stenosis of unspecified carotid artery: Secondary | ICD-10-CM | POA: Diagnosis present

## 2014-01-14 DIAGNOSIS — J9600 Acute respiratory failure, unspecified whether with hypoxia or hypercapnia: Secondary | ICD-10-CM | POA: Diagnosis not present

## 2014-01-14 DIAGNOSIS — J441 Chronic obstructive pulmonary disease with (acute) exacerbation: Secondary | ICD-10-CM | POA: Diagnosis present

## 2014-01-14 DIAGNOSIS — Z888 Allergy status to other drugs, medicaments and biological substances status: Secondary | ICD-10-CM | POA: Diagnosis not present

## 2014-01-14 DIAGNOSIS — E1129 Type 2 diabetes mellitus with other diabetic kidney complication: Secondary | ICD-10-CM | POA: Diagnosis present

## 2014-01-14 DIAGNOSIS — Z881 Allergy status to other antibiotic agents status: Secondary | ICD-10-CM

## 2014-01-14 DIAGNOSIS — I129 Hypertensive chronic kidney disease with stage 1 through stage 4 chronic kidney disease, or unspecified chronic kidney disease: Secondary | ICD-10-CM | POA: Diagnosis present

## 2014-01-14 DIAGNOSIS — N289 Disorder of kidney and ureter, unspecified: Secondary | ICD-10-CM

## 2014-01-14 DIAGNOSIS — R0989 Other specified symptoms and signs involving the circulatory and respiratory systems: Secondary | ICD-10-CM

## 2014-01-14 DIAGNOSIS — G8929 Other chronic pain: Secondary | ICD-10-CM | POA: Diagnosis present

## 2014-01-14 DIAGNOSIS — R490 Dysphonia: Secondary | ICD-10-CM | POA: Diagnosis present

## 2014-01-14 DIAGNOSIS — J449 Chronic obstructive pulmonary disease, unspecified: Secondary | ICD-10-CM | POA: Diagnosis not present

## 2014-01-14 DIAGNOSIS — J4489 Other specified chronic obstructive pulmonary disease: Secondary | ICD-10-CM | POA: Diagnosis not present

## 2014-01-14 DIAGNOSIS — IMO0001 Reserved for inherently not codable concepts without codable children: Secondary | ICD-10-CM | POA: Diagnosis not present

## 2014-01-14 DIAGNOSIS — D649 Anemia, unspecified: Secondary | ICD-10-CM | POA: Diagnosis not present

## 2014-01-14 DIAGNOSIS — I959 Hypotension, unspecified: Secondary | ICD-10-CM | POA: Diagnosis present

## 2014-01-14 DIAGNOSIS — E1165 Type 2 diabetes mellitus with hyperglycemia: Secondary | ICD-10-CM | POA: Diagnosis present

## 2014-01-14 DIAGNOSIS — R059 Cough, unspecified: Secondary | ICD-10-CM | POA: Diagnosis not present

## 2014-01-14 DIAGNOSIS — N058 Unspecified nephritic syndrome with other morphologic changes: Secondary | ICD-10-CM | POA: Diagnosis present

## 2014-01-14 DIAGNOSIS — Z825 Family history of asthma and other chronic lower respiratory diseases: Secondary | ICD-10-CM

## 2014-01-14 DIAGNOSIS — I10 Essential (primary) hypertension: Secondary | ICD-10-CM | POA: Diagnosis not present

## 2014-01-14 DIAGNOSIS — E785 Hyperlipidemia, unspecified: Secondary | ICD-10-CM | POA: Diagnosis present

## 2014-01-14 DIAGNOSIS — I480 Paroxysmal atrial fibrillation: Secondary | ICD-10-CM | POA: Diagnosis present

## 2014-01-14 DIAGNOSIS — I509 Heart failure, unspecified: Secondary | ICD-10-CM

## 2014-01-14 DIAGNOSIS — Z5189 Encounter for other specified aftercare: Secondary | ICD-10-CM | POA: Diagnosis not present

## 2014-01-14 DIAGNOSIS — I4891 Unspecified atrial fibrillation: Secondary | ICD-10-CM

## 2014-01-14 DIAGNOSIS — R0602 Shortness of breath: Secondary | ICD-10-CM | POA: Diagnosis not present

## 2014-01-14 DIAGNOSIS — I369 Nonrheumatic tricuspid valve disorder, unspecified: Secondary | ICD-10-CM | POA: Diagnosis not present

## 2014-01-14 DIAGNOSIS — R079 Chest pain, unspecified: Secondary | ICD-10-CM

## 2014-01-14 HISTORY — DX: Depression, unspecified: F32.A

## 2014-01-14 HISTORY — DX: Pneumonia, unspecified organism: J18.9

## 2014-01-14 HISTORY — DX: Gastro-esophageal reflux disease without esophagitis: K21.9

## 2014-01-14 HISTORY — DX: Unspecified osteoarthritis, unspecified site: M19.90

## 2014-01-14 HISTORY — DX: Major depressive disorder, single episode, unspecified: F32.9

## 2014-01-14 LAB — BASIC METABOLIC PANEL
BUN: 30 mg/dL — ABNORMAL HIGH (ref 6–23)
CHLORIDE: 101 meq/L (ref 96–112)
CO2: 24 mEq/L (ref 19–32)
Calcium: 9.7 mg/dL (ref 8.4–10.5)
Creatinine, Ser: 1.56 mg/dL — ABNORMAL HIGH (ref 0.50–1.10)
GFR calc Af Amer: 38 mL/min — ABNORMAL LOW (ref >90)
GFR calc non Af Amer: 33 mL/min — ABNORMAL LOW (ref >90)
Glucose, Bld: 143 mg/dL — ABNORMAL HIGH (ref 70–99)
POTASSIUM: 4.5 meq/L (ref 3.7–5.3)
Sodium: 140 mEq/L (ref 137–147)

## 2014-01-14 LAB — CBC WITH DIFFERENTIAL/PLATELET
Basophils Absolute: 0 10*3/uL (ref 0.0–0.1)
Basophils Relative: 0 % (ref 0–1)
Eosinophils Absolute: 0.2 10*3/uL (ref 0.0–0.7)
Eosinophils Relative: 1 % (ref 0–5)
HCT: 29.8 % — ABNORMAL LOW (ref 36.0–46.0)
HEMOGLOBIN: 9.6 g/dL — AB (ref 12.0–15.0)
LYMPHS PCT: 6 % — AB (ref 12–46)
Lymphs Abs: 0.9 10*3/uL (ref 0.7–4.0)
MCH: 30.2 pg (ref 26.0–34.0)
MCHC: 32.2 g/dL (ref 30.0–36.0)
MCV: 93.7 fL (ref 78.0–100.0)
MONOS PCT: 3 % (ref 3–12)
Monocytes Absolute: 0.5 10*3/uL (ref 0.1–1.0)
NEUTROS ABS: 14.2 10*3/uL — AB (ref 1.7–7.7)
Neutrophils Relative %: 90 % — ABNORMAL HIGH (ref 43–77)
Platelets: 295 10*3/uL (ref 150–400)
RBC: 3.18 MIL/uL — ABNORMAL LOW (ref 3.87–5.11)
RDW: 16.8 % — ABNORMAL HIGH (ref 11.5–15.5)
WBC: 15.8 10*3/uL — AB (ref 4.0–10.5)

## 2014-01-14 LAB — GLUCOSE, CAPILLARY: Glucose-Capillary: 262 mg/dL — ABNORMAL HIGH (ref 70–99)

## 2014-01-14 LAB — POCT I-STAT TROPONIN I: TROPONIN I, POC: 0.01 ng/mL (ref 0.00–0.08)

## 2014-01-14 LAB — PRO B NATRIURETIC PEPTIDE: Pro B Natriuretic peptide (BNP): 4957 pg/mL — ABNORMAL HIGH (ref 0–125)

## 2014-01-14 MED ORDER — EZETIMIBE-SIMVASTATIN 10-40 MG PO TABS: 1.0000 | ORAL_TABLET | Freq: Every day | ORAL | Status: DC

## 2014-01-14 MED ORDER — ALBUTEROL SULFATE (2.5 MG/3ML) 0.083% IN NEBU
2.5000 mg | INHALATION_SOLUTION | RESPIRATORY_TRACT | Status: DC | PRN
  Filled 2014-01-14: qty 3

## 2014-01-14 MED ORDER — IPRATROPIUM-ALBUTEROL 0.5-2.5 (3) MG/3ML IN SOLN
3.0000 mL | Freq: Three times a day (TID) | RESPIRATORY_TRACT | Status: DC
  Administered 2014-01-15: 3 mL via RESPIRATORY_TRACT
  Filled 2014-01-14: qty 3

## 2014-01-14 MED ORDER — VITAMIN C 500 MG PO CAPS: 1.0000 | ORAL_CAPSULE | Freq: Every day | ORAL | Status: DC

## 2014-01-14 MED ORDER — VITAMIN C 500 MG PO TABS
500.0000 mg | ORAL_TABLET | Freq: Every day | ORAL | Status: DC
  Administered 2014-01-15 – 2014-01-23 (×9): 500 mg via ORAL
  Filled 2014-01-14 (×9): qty 1

## 2014-01-14 MED ORDER — VITAMIN E 180 MG (400 UNIT) PO CAPS
400.0000 [IU] | ORAL_CAPSULE | Freq: Every day | ORAL | Status: DC
  Administered 2014-01-15 – 2014-01-23 (×9): 400 [IU] via ORAL
  Filled 2014-01-14 (×9): qty 1

## 2014-01-14 MED ORDER — BUDESONIDE-FORMOTEROL FUMARATE 80-4.5 MCG/ACT IN AERO
2.0000 | INHALATION_SPRAY | Freq: Two times a day (BID) | RESPIRATORY_TRACT | Status: DC
  Administered 2014-01-15 – 2014-01-23 (×17): 2 via RESPIRATORY_TRACT
  Filled 2014-01-14 (×2): qty 6.9

## 2014-01-14 MED ORDER — DABIGATRAN ETEXILATE MESYLATE 150 MG PO CAPS
150.0000 mg | ORAL_CAPSULE | Freq: Two times a day (BID) | ORAL | Status: DC
  Administered 2014-01-15 – 2014-01-23 (×18): 150 mg via ORAL
  Filled 2014-01-14 (×19): qty 1

## 2014-01-14 MED ORDER — METHYLPREDNISOLONE SODIUM SUCC 125 MG IJ SOLR
80.0000 mg | Freq: Three times a day (TID) | INTRAMUSCULAR | Status: DC
  Administered 2014-01-15: 80 mg via INTRAVENOUS
  Filled 2014-01-14 (×5): qty 1.28

## 2014-01-14 MED ORDER — ACAMPROSATE CALCIUM 333 MG PO TBEC
666.0000 mg | DELAYED_RELEASE_TABLET | Freq: Three times a day (TID) | ORAL | Status: DC
  Administered 2014-01-15 – 2014-01-16 (×6): 666 mg via ORAL
  Filled 2014-01-14 (×10): qty 2

## 2014-01-14 MED ORDER — FUROSEMIDE 10 MG/ML IJ SOLN
40.0000 mg | Freq: Two times a day (BID) | INTRAMUSCULAR | Status: DC
  Filled 2014-01-14 (×2): qty 4

## 2014-01-14 MED ORDER — DILTIAZEM HCL ER 60 MG PO CP12: 120.0000 mg | ORAL_CAPSULE | Freq: Two times a day (BID) | ORAL | Status: DC

## 2014-01-14 MED ORDER — DILTIAZEM HCL 100 MG IV SOLR
5.0000 mg/h | INTRAVENOUS | Status: DC
  Administered 2014-01-14: 5 mg/h via INTRAVENOUS

## 2014-01-14 MED ORDER — GLYBURIDE MICRONIZED 3 MG PO TABS
3.0000 mg | ORAL_TABLET | Freq: Every day | ORAL | Status: DC
  Administered 2014-01-15: 3 mg via ORAL
  Filled 2014-01-14 (×2): qty 1

## 2014-01-14 MED ORDER — DABIGATRAN ETEXILATE MESYLATE 150 MG PO CAPS: 150.0000 mg | ORAL_CAPSULE | Freq: Every day | ORAL | Status: DC

## 2014-01-14 MED ORDER — DILTIAZEM HCL 25 MG/5ML IV SOLN
10.0000 mg | Freq: Once | INTRAVENOUS | Status: AC
  Administered 2014-01-14: 10 mg via INTRAVENOUS
  Filled 2014-01-14: qty 5

## 2014-01-14 MED ORDER — EZETIMIBE 10 MG PO TABS
10.0000 mg | ORAL_TABLET | Freq: Every day | ORAL | Status: DC
  Administered 2014-01-15 – 2014-01-23 (×9): 10 mg via ORAL
  Filled 2014-01-14 (×9): qty 1

## 2014-01-14 MED ORDER — IRBESARTAN 75 MG PO TABS
75.0000 mg | ORAL_TABLET | Freq: Every day | ORAL | Status: DC
  Administered 2014-01-15 – 2014-01-20 (×6): 75 mg via ORAL
  Filled 2014-01-14 (×9): qty 1

## 2014-01-14 MED ORDER — ACETAMINOPHEN 325 MG PO TABS
325.0000 mg | ORAL_TABLET | Freq: Every day | ORAL | Status: DC | PRN
  Administered 2014-01-15: 325 mg via ORAL
  Filled 2014-01-14: qty 1

## 2014-01-14 MED ORDER — DILTIAZEM HCL ER 60 MG PO CP12
120.0000 mg | ORAL_CAPSULE | Freq: Two times a day (BID) | ORAL | Status: DC
  Administered 2014-01-15 – 2014-01-20 (×13): 120 mg via ORAL
  Filled 2014-01-14 (×20): qty 2

## 2014-01-14 MED ORDER — TIOTROPIUM BROMIDE MONOHYDRATE 18 MCG IN CAPS
18.0000 ug | ORAL_CAPSULE | Freq: Every day | RESPIRATORY_TRACT | Status: DC
  Filled 2014-01-14: qty 5

## 2014-01-14 MED ORDER — ATORVASTATIN CALCIUM 20 MG PO TABS
20.0000 mg | ORAL_TABLET | Freq: Every day | ORAL | Status: DC
  Administered 2014-01-15 – 2014-01-23 (×9): 20 mg via ORAL
  Filled 2014-01-14 (×9): qty 1

## 2014-01-14 MED ORDER — IPRATROPIUM BROMIDE 0.02 % IN SOLN: 0.5000 mg | Freq: Four times a day (QID) | RESPIRATORY_TRACT | Status: DC

## 2014-01-14 MED ORDER — ALBUTEROL SULFATE (2.5 MG/3ML) 0.083% IN NEBU: 2.5000 mg | INHALATION_SOLUTION | Freq: Four times a day (QID) | RESPIRATORY_TRACT | Status: DC

## 2014-01-14 MED ORDER — VITAMIN D3 25 MCG (1000 UNIT) PO TABS
1000.0000 [IU] | ORAL_TABLET | Freq: Every day | ORAL | Status: DC
  Administered 2014-01-15 – 2014-01-23 (×9): 1000 [IU] via ORAL
  Filled 2014-01-14 (×9): qty 1

## 2014-01-14 NOTE — H&P (Signed)
Patient Demographics  Paige Hardin, is a 68 y.o. female  MRN: [id number]   DOB - 10-16-1946  Admit Date - 01/14/2014  Outpatient Primary MD for the patient is Stephens Shire, MD   With History of -  Past Medical History  Diagnosis Date  . Chronic low back pain   . Emphysema   . Type 2 diabetes mellitus   . Paroxysmal atrial fibrillation   . COPD (chronic obstructive pulmonary disease)   . Hyperlipidemia   . Diastolic congestive heart failure   . Hypertension   . Obese   . Carotid atherosclerosis   . Lung nodules   . Hoarseness, chronic       Past Surgical History  Procedure Laterality Date  . Vaginal hysterectomy    . Left side breast lumpectomy    . Esophagogastroduodenoscopy  01/26/2012    Procedure: ESOPHAGOGASTRODUODENOSCOPY (EGD);  Surgeon: Beryle Beams, MD;  Location: [facility];  Service: Endoscopy;  Laterality: N/A;  . Colonoscopy  01/26/2012    Procedure: COLONOSCOPY;  Surgeon: Beryle Beams, MD;  Location: [facility];  Service: Endoscopy;  Laterality: N/A;    in for   Chief Complaint  Patient presents with  . Shortness of Breath     HPI  Paige Hardin  is a 68 y.o. female, with known history of COPD, diastolic CHF, active fibrillation on pradaxa , not using any home oxygen, he presents with complaints of shortness of breath, cough, nonproductive, patient was noticed to be hypoxic in ED, requiring 3 L nasal cannula, had significant wheezing, improved after receiving nebulizer treatment, her chest x-ray showing a vascular cause congestion, as well patient complains of worsening lower extremity edema, she is known to have history of diastolic CHF, the patient was noticed to be in A. fib with RVR, given IV Cardizem 10 mg, then started on Cardizem drip, currently Cardizem drip stopped as her heart  rate in the low 100, denies any chest pain, fever chills, coffee-ground emesis, hematemesis.    Review of Systems    In addition to the HPI above,  No Fever-chills, No Headache, No changes with Vision or hearing, No problems swallowing food or Liquids, No Chest pain, complaints of Cough or Shortness of Breath, No Abdominal pain, No Nausea or Vommitting, Bowel movements are regular, No Blood in stool or Urine, No dysuria, No new skin rashes or bruises, No new joints pains-aches,  No new weakness, tingling, numbness in any extremity, No recent weight gain or loss, No polyuria, polydypsia or polyphagia, No significant Mental Stressors.  A full 10 point Review of Systems was done, except as stated above, all other Review of Systems were negative.   Social History History  Substance Use Topics  . Smoking status: Former Smoker -- 3.00 packs/day for 45 years    Types: Cigarettes    Quit date: 12/28/2008  . Smokeless tobacco: Not on file  . Alcohol Use: No     Family  History Family History  Problem Relation Age of Onset  . Breast cancer Sister   . Heart disease    . Hodgkin's lymphoma    . Asthma Mother      Prior to Admission medications   Medication Sig Start Date End Date Taking? Authorizing Provider  acamprosate (CAMPRAL) 333 MG tablet Take 666 mg by mouth 3 (three) times daily with meals.   Yes Historical Provider, MD  acetaminophen (TYLENOL) 325 MG tablet Take 325 mg by mouth daily as needed for mild pain.    Yes Historical Provider, MD  albuterol (PROAIR HFA) 108 (90 BASE) MCG/ACT inhaler Inhale 2 puffs into the lungs every 6 (six) hours as needed for shortness of breath. 11/07/11  Yes Kathee Delton, MD  Ascorbic Acid (VITAMIN C) 500 MG CAPS Take 1 tablet by mouth daily.   Yes Historical Provider, MD  budesonide-formoterol (SYMBICORT) 80-4.5 MCG/ACT inhaler Inhale 2 puffs into the lungs 2 (two) times daily. 11/07/11  Yes Kathee Delton, MD  cholecalciferol (VITAMIN  D) 1000 UNITS tablet Take 1,000 Units by mouth daily.     Yes Historical Provider, MD  dabigatran (PRADAXA) 150 MG CAPS Take 150 mg by mouth daily. 12/06/11  Yes Larey Dresser, MD  diltiazem (CARDIZEM SR) 120 MG 12 hr capsule Once a day    Yes Historical Provider, MD  DIOVAN 80 MG tablet TAKE 1 TABLET BY MOUTH EVERY DAY 04/05/11  Yes Larey Dresser, MD  ezetimibe-simvastatin (VYTORIN) 10-40 MG per tablet Take 1 tablet by mouth at bedtime.     Yes Historical Provider, MD  furosemide (LASIX) 40 MG tablet Take 40 mg by mouth daily.  08/08/11  Yes Larey Dresser, MD  glyBURIDE micronized (GLYNASE) 3 MG tablet Take 3 mg by mouth daily.     Yes Historical Provider, MD  Multiple Vitamins-Minerals (MULTIVITAMIN PO) Take 1 tablet by mouth daily.   Yes Historical Provider, MD  OVER THE COUNTER MEDICATION Take 1 tablet by mouth daily. Medication: zinc 5mg    Yes Historical Provider, MD  pioglitazone (ACTOS) 45 MG tablet Take 45 mg by mouth daily.     Yes Historical Provider, MD  potassium chloride SA (K-DUR,KLOR-CON) 20 MEQ tablet Take 40 mEq by mouth daily.    Yes Historical Provider, MD  SPIRIVA HANDIHALER 18 MCG inhalation capsule PLACE 1 CAPSULE INTO INHALER AND INHALE ONCE DAILY 11/04/13  Yes Kathee Delton, MD  vitamin E 400 UNIT capsule Take 400 Units by mouth daily.   Yes Historical Provider, MD    Allergies  Allergen Reactions  . Ace Inhibitors     REACTION: cough  . Clindamycin     REACTION: rash    Physical Exam  Vitals  Blood pressure 118/54, pulse 147, temperature 98.7 F (37.1 C), temperature source Oral, resp. rate 17, SpO2 97.00%.   1. General obese female lying in bed in NAD,    2. Normal affect and insight, Not Suicidal or Homicidal, Awake Alert, Oriented X 3.  3. No F.N deficits, ALL C.Nerves Intact, Strength 5/5 all 4 extremities, Sensation intact all 4 extremities, Plantars down going.  4. Ears and Eyes appear Normal, Conjunctivae clear, PERRLA. Moist Oral Mucosa.  5.  Supple Neck, 7 cm JVD, positive hepatojugular reflux. No cervical lymphadenopathy appriciated, No Carotid Bruits.  6. Symmetrical Chest wall movement, Good air movement bilaterally, scattered wheezing no rales or rhonchi.  7. irregular irregular, No Gallops, Rubs or Murmurs, No Parasternal Heave.  8. Positive Bowel Sounds, Abdomen  Soft, Non tender, No organomegaly appriciated,No rebound -guarding or rigidity.  9.  No Cyanosis, Normal Skin Turgor, No Skin Rash or Bruise.  10. Good muscle tone,  joints appear normal , no effusions, Normal ROM. +2 edema in bilateral lower extremities  11. No Palpable Lymph Nodes in Neck or Axillae    Data Review  CBC  Recent Labs Lab 01/14/14 1812  WBC 15.8*  HGB 9.6*  HCT 29.8*  PLT 295  MCV 93.7  MCH 30.2  MCHC 32.2  RDW 16.8*  LYMPHSABS 0.9  MONOABS 0.5  EOSABS 0.2  BASOSABS 0.0   ------------------------------------------------------------------------------------------------------------------  Chemistries   Recent Labs Lab 01/14/14 1705  NA 140  K 4.5  CL 101  CO2 24  GLUCOSE 143*  BUN 30*  CREATININE 1.56*  CALCIUM 9.7   ------------------------------------------------------------------------------------------------------------------ CrCl is unknown because both a height and weight (above a minimum accepted value) are required for this calculation. ------------------------------------------------------------------------------------------------------------------ No results found for this basename: TSH, T4TOTAL, FREET3, T3FREE, THYROIDAB,  in the last 72 hours   Coagulation profile No results found for this basename: INR, PROTIME,  in the last 168 hours ------------------------------------------------------------------------------------------------------------------- No results found for this basename: DDIMER,  in the last 72  hours -------------------------------------------------------------------------------------------------------------------  Cardiac Enzymes No results found for this basename: CK, CKMB, TROPONINI, MYOGLOBIN,  in the last 168 hours ------------------------------------------------------------------------------------------------------------------ No components found with this basename: POCBNP,    ---------------------------------------------------------------------------------------------------------------  Urinalysis    Component Value Date/Time   COLORURINE YELLOW 12/28/2008 2125   APPEARANCEUR CLOUDY* 12/28/2008 2125   LABSPEC 1.014 12/28/2008 2125   PHURINE 6.5 12/28/2008 2125   GLUCOSEU NEGATIVE 12/28/2008 2125   HGBUR TRACE* 12/28/2008 2125   BILIRUBINUR NEGATIVE 12/28/2008 2125   KETONESUR NEGATIVE 12/28/2008 2125   PROTEINUR NEGATIVE 12/28/2008 2125   UROBILINOGEN 0.2 12/28/2008 2125   NITRITE POSITIVE* 12/28/2008 2125   LEUKOCYTESUR MODERATE* 12/28/2008 2125    ----------------------------------------------------------------------------------------------------------------  Imaging results:   Dg Chest Portable 1 View  01/14/2014   CLINICAL DATA:  Worsening dyspnea, history of emphysema and CHF  EXAM: PORTABLE CHEST - 1 VIEW  COMPARISON:  CT CHEST W/O CM dated 09/09/2010  FINDINGS: The lungs are well-expanded. The interstitial markings are mildly increased. The pulmonary vascularity is prominent centrally and there is mild cephalization. The cardiac silhouette is mildly enlarged. There is no pleural effusion. There is no alveolar infiltrate. The mediastinum is normal in width. There is tortuosity of the descending thoracic aorta. The observed portions of the bony thorax appear normal.  IMPRESSION: Findings are consistent with CHF superimposed upon COPD. There is no alveolar pneumonia.   Electronically Signed   By: David  Martinique   On: 01/14/2014 16:43    My personal review of EKG: Rhythm A. fib with  RVR, Rate  147/min,  , no Acute ST changes    Assessment & Plan  Active Problems:   Acute respiratory failure   HYPERLIPIDEMIA-MIXED   DIASTOLIC HEART FAILURE, CHRONIC   COPD (chronic obstructive pulmonary disease) with emphysema   ATRIAL FIBRILLATION, HX OF    1. acute respiratory failure: Appears to be multifactorial, most likely related to her COPD exacerbation, and acute congestive heart failure, patient will be kept when necessary oxygen to keep her O2 sat around 95%,. 2. COPD exacerbation: Will start her on IV Solu-Medrol, DuoNeb's every 6 hours, when necessary albuterol, does not have productive cough does not have infiltrate on her chest x-ray showed there is no indication for IV antibiotics 3. Acute congestive heart failure: Known  history of diastolic CHF, will start patient on IV Lasix, check in and outs, and daily weights, would have morning team consult cardiology. 4. A. fib with RVR: Rate improved after one IV Cardizem push, we'll continue her on her home dose by mouth Cardizem, and if no improvement will resume her back on Cardizem drip, she is on products. 5. Hyperlipidemia: Continue with Vytorin   DVT Prophylaxis , on pradaxa, SCDs  AM Labs Ordered, also please review Full Orders  Family Communication: Admission, patients condition and plan of care including tests being ordered have been discussed with the patient who indicate understanding and agree with the plan and Code Status.  Code Status DNR, discussed with the patient and she confirms her DO NOT RESUSCITATE CODE STATUS  Likely DC to  home  Condition GUARDED   Time spent in minutes : 87min    ELGERGAWY, DAWOOD M.D on 01/14/2014 at 8:28 PM    And look for the night coverage person covering me after hours  [facility] Office  [phone]

## 2014-01-14 NOTE — Consult Note (Signed)
Admit date: 01/14/2014 Referring Physician  Dr. [name] Primary Physician  Dr. Tollie Pizza Primary Cardiologist  Dr. Aundra Dubin Reason for Consultation  afib with RVR and CHF  HPI: Paige Hardin is a 68 y.o. female, with known history of COPD, diastolic CHF, atrial fibrillation on pradaxa , not using any home oxygen, who presents to ER with complaints of shortness of breath and nonproductive cough.  She says that she has chronic LE edema which she thinks is stable but she has gained over 20-30 pounds in the past few months which she attributes to the death of her son.  In the ER the patient was noticed to be hypoxic in ED, requiring 3 L nasal cannula. She had significant wheezing, improved after receiving nebulizer treatment and her chest x-ray showed vascular congestion.  She was noted to be in  A. fib with RVR although she denied any palpitations at the time and was given IV Cardizem 10 mg, then started on Cardizem drip, currently Cardizem drip stopped as her BP dropped and she is now in NSR.  She denies any chest pain, fever chills,nausea, vomiting or flu like symptoms.  Cardiology is now consulted to help in management of CHF.       PMH:   Past Medical History  Diagnosis Date  . Chronic low back pain   . Emphysema   . Type 2 diabetes mellitus   . Paroxysmal atrial fibrillation   . COPD (chronic obstructive pulmonary disease)   . Hyperlipidemia   . Diastolic congestive heart failure   . Hypertension   . Obese   . Carotid atherosclerosis   . Lung nodules   . Hoarseness, chronic      PSH:   Past Surgical History  Procedure Laterality Date  . Vaginal hysterectomy    . Left side breast lumpectomy    . Esophagogastroduodenoscopy  01/26/2012    Procedure: ESOPHAGOGASTRODUODENOSCOPY (EGD);  Surgeon: Beryle Beams, MD;  Location: [facility];  Service: Endoscopy;  Laterality: N/A;  . Colonoscopy  01/26/2012    Procedure: COLONOSCOPY;  Surgeon: Beryle Beams, MD;  Location: [facility];  Service:  Endoscopy;  Laterality: N/A;    Allergies:  Ace inhibitors and Clindamycin Prior to Admit Meds:   (Not in a hospital admission) Fam HX:    Family History  Problem Relation Age of Onset  . Breast cancer Sister   . Heart disease    . Hodgkin's lymphoma    . Asthma Mother    Social HX:    History   Social History  . Marital Status: Legally Separated    Spouse Name: N/A    Number of Children: N/A  . Years of Education: N/A   Occupational History  . retired from [employer]    Social History Main Topics  . Smoking status: Former Smoker -- 3.00 packs/day for 45 years    Types: Cigarettes    Quit date: 12/28/2008  . Smokeless tobacco: Not on file  . Alcohol Use: No  . Drug Use: No  . Sexual Activity: Not on file   Other Topics Concern  . Not on file   Social History Narrative  . No narrative on file     ROS:  All 11 ROS were addressed and are negative except what is stated in the HPI  Physical Exam: Blood pressure 118/54, pulse 147, temperature 98.7 F (37.1 C), temperature source Oral, resp. rate 17, SpO2 97.00%.    General: Well developed, well nourished, in no acute distress  Head: Eyes PERRLA, No xanthomas.   Normal cephalic and atramatic  Lungs:  Scattered wheezes Heart:   HRRR S1 S2 Pulses are 2+ & equal.            No carotid bruit. No JVD.  No abdominal bruits. No femoral bruits. Abdomen: Bowel sounds are positive, abdomen soft and non-tender without masses  Extremities:   2+ edema bilaterally Neuro: Alert and oriented X 3. Psych:  Good affect, responds appropriately    Labs:   Lab Results  Component Value Date   WBC 15.8* 01/14/2014   HGB 9.6* 01/14/2014   HCT 29.8* 01/14/2014   MCV 93.7 01/14/2014   PLT 295 01/14/2014    Recent Labs Lab 01/14/14 1705  NA 140  K 4.5  CL 101  CO2 24  BUN 30*  CREATININE 1.56*  CALCIUM 9.7  GLUCOSE 143*   No results found for this basename: PTT   Lab Results  Component Value Date   INR 3.6 01/12/2011    INR 3.4 12/22/2010   INR 3.6 12/08/2010   PROTIME 27.3 05/14/2009   PROTIME 20.2 04/29/2009   Lab Results  Component Value Date   CKTOTAL 140 01/05/2009   CKMB 2.2 01/05/2009   TROPONINI  Value: 0.01        NO INDICATION OF MYOCARDIAL INJURY. 01/05/2009     Lab Results  Component Value Date   CHOL 125 05/24/2009   Lab Results  Component Value Date   HDL 39.60 05/24/2009   Lab Results  Component Value Date   LDLCALC 68 05/24/2009   Lab Results  Component Value Date   TRIG 86.0 05/24/2009   Lab Results  Component Value Date   CHOLHDL 3 05/24/2009   No results found for this basename: LDLDIRECT      Radiology:  Dg Chest Portable 1 View  01/14/2014   CLINICAL DATA:  Worsening dyspnea, history of emphysema and CHF  EXAM: PORTABLE CHEST - 1 VIEW  COMPARISON:  CT CHEST W/O CM dated 09/09/2010  FINDINGS: The lungs are well-expanded. The interstitial markings are mildly increased. The pulmonary vascularity is prominent centrally and there is mild cephalization. The cardiac silhouette is mildly enlarged. There is no pleural effusion. There is no alveolar infiltrate. The mediastinum is normal in width. There is tortuosity of the descending thoracic aorta. The observed portions of the bony thorax appear normal.  IMPRESSION: Findings are consistent with CHF superimposed upon COPD. There is no alveolar pneumonia.   Electronically Signed   By: David  Martinique   On: 01/14/2014 16:43    EKG:  Atrial fibrillation with RVR  ASSESSMENT:  1.  Afib with RVR now in NSR after IV Cardizem.  She has a history of PAF and is on chronic systemic anticoagulation with Pradaxa.  This was most likely exacerbated by COPD flare. 2.  Acute on chronic diastolic CHF most likely exacerbated by COPD exacerbation and rapid afib 3.  Acute respiratory failure secondary to COPD exacerbation and acute diastolic CHF 4.  Acute on chronic CKD 5.  DM type II 6.  HTN  PLAN:   1.  Treatment of COPD exacerbation per Hospitalist 2.   Agree with IV diuretics and follow renal function closely 3.  She has not had an echo since 2010 so will recheck LVF 4.  Continue home dose of Cardizem and may need to increase if she has a reoccurrence of afib.  Since BP has been borderline low will keep at current home dose for  now since afib was most likely triggered by COPD exacerbation. 5.  Continue Pradaxa  Will follow with you   Sueanne Margarita, MD  01/14/2014  8:08 PM

## 2014-01-14 NOTE — ED Provider Notes (Signed)
CSN: [id number]     Arrival date & time 01/14/14  1605 History   First MD Initiated Contact with Patient 01/14/14 1615     Chief Complaint  Patient presents with  . Shortness of Breath     Patient is a 68 y.o. female presenting with shortness of breath. The history is provided by the patient.  Shortness of Breath Severity:  Moderate Onset quality:  Gradual Duration:  1 day Timing:  Intermittent Progression:  Worsening Chronicity:  Recurrent Relieved by: nebulizer. Worsened by:  Activity Associated symptoms: cough and wheezing   Associated symptoms: no chest pain, no fever, no hemoptysis, no syncope and no vomiting   pt reports h/o COPD and for over past day she has had increased cough/sob/wheezing No active CP is reported She is not on home oxygen She does not smoke currently  She has been given nebs en route to hospital   Past Medical History  Diagnosis Date  . Chronic low back pain   . Emphysema   . Type 2 diabetes mellitus   . Paroxysmal atrial fibrillation   . COPD (chronic obstructive pulmonary disease)   . Hyperlipidemia   . Diastolic congestive heart failure   . Hypertension   . Obese   . Carotid atherosclerosis   . Lung nodules   . Hoarseness, chronic    Past Surgical History  Procedure Laterality Date  . Vaginal hysterectomy    . Left side breast lumpectomy    . Esophagogastroduodenoscopy  01/26/2012    Procedure: ESOPHAGOGASTRODUODENOSCOPY (EGD);  Surgeon: Beryle Beams, MD;  Location: [facility];  Service: Endoscopy;  Laterality: N/A;  . Colonoscopy  01/26/2012    Procedure: COLONOSCOPY;  Surgeon: Beryle Beams, MD;  Location: [facility];  Service: Endoscopy;  Laterality: N/A;   Family History  Problem Relation Age of Onset  . Breast cancer Sister   . Heart disease    . Hodgkin's lymphoma    . Asthma Mother    History  Substance Use Topics  . Smoking status: Former Smoker -- 3.00 packs/day for 45 years    Types: Cigarettes    Quit date:  12/28/2008  . Smokeless tobacco: Not on file  . Alcohol Use: No   OB History   Grav Para Term Preterm Abortions TAB SAB Ect Mult Living                 Review of Systems  Constitutional: Negative for fever.  Respiratory: Positive for cough, shortness of breath and wheezing. Negative for hemoptysis.   Cardiovascular: Negative for chest pain and syncope.  Gastrointestinal: Negative for vomiting and blood in stool.       Denies melena   Psychiatric/Behavioral: Negative for suicidal ideas. The patient is nervous/anxious.   All other systems reviewed and are negative.      Allergies  Ace inhibitors and Clindamycin  Home Medications   Current Outpatient Rx  Name  Route  Sig  Dispense  Refill  . acetaminophen (TYLENOL) 325 MG tablet      as needed.           Marland Kitchen albuterol (PROAIR HFA) 108 (90 BASE) MCG/ACT inhaler   Inhalation   Inhale 2 puffs into the lungs every 6 (six) hours as needed for shortness of breath.   1 Inhaler   3   . Ascorbic Acid (VITAMIN C) 1000 MG tablet   Oral   Take 1,000 mg by mouth daily.           Marland Kitchen  budesonide-formoterol (SYMBICORT) 80-4.5 MCG/ACT inhaler   Inhalation   Inhale 2 puffs into the lungs 2 (two) times daily.   1 Inhaler   5   . Calcium Carbonate (CALCIUM 500 PO)      Once a day          . cholecalciferol (VITAMIN D) 1000 UNITS tablet   Oral   Take 1,000 Units by mouth daily.           . dabigatran (PRADAXA) 150 MG CAPS   Oral   Take 150 mg by mouth daily.         Marland Kitchen diltiazem (CARDIZEM SR) 120 MG 12 hr capsule      Once a day          . DIOVAN 80 MG tablet      TAKE 1 TABLET BY MOUTH EVERY DAY   30 tablet   3   . ezetimibe-simvastatin (VYTORIN) 10-40 MG per tablet   Oral   Take 1 tablet by mouth at bedtime.           . Fish Oil OIL      Once a day          . furosemide (LASIX) 40 MG tablet   Oral   Take 40 mg by mouth daily.          Marland Kitchen glyBURIDE micronized (GLYNASE) 3 MG tablet   Oral    Take 3 mg by mouth daily.           . Magnesium 250 MG TABS      Once a day          . Multiple Vitamins-Minerals (MULTIVITAMIN PO)   Oral   Take 1 tablet by mouth daily.         . pioglitazone (ACTOS) 45 MG tablet   Oral   Take 45 mg by mouth daily.           . potassium chloride SA (K-DUR,KLOR-CON) 20 MEQ tablet   Oral   Take 40 mEq by mouth daily.          Marland Kitchen SPIRIVA HANDIHALER 18 MCG inhalation capsule      PLACE 1 CAPSULE INTO INHALER AND INHALE ONCE DAILY   30 capsule   4   . vitamin B-12 (CYANOCOBALAMIN) 1000 MCG tablet   Oral   Take 1,000 mcg by mouth daily.           . Zinc (ELITE ZINC) 15 MG CAPS      Once a day           BP 118/54  Pulse 147  Temp(Src) 98.7 F (37.1 C) (Oral)  Resp 17  SpO2 97% Physical Exam CONSTITUTIONAL: Well developed/well nourished, anxious HEAD: Normocephalic/atraumatic EYES: EOMI/PERRL ENMT: Mucous membranes moist NECK: supple no meningeal signs SPINE:entire spine nontender CV: irregular, tachycardic, no loud murmurs noted LUNGS: tachypneic.  Scattered wheeze but she does have good air movement and able to speak to me clearly ABDOMEN: soft, nontender, no rebound or guarding GU:no cva tenderness NEURO: Pt is awake/alert, moves all extremitiesx4 EXTREMITIES: pulses normal, full ROM, symmetric pitting edema to bilateral LE SKIN: warm, color normal PSYCH: no abnormalities of mood noted  ED Course  Procedures  CRITICAL CARE Performed by: Sharyon Cable Total critical care time: 45 Critical care time was exclusive of separately billable procedures and treating other patients. Critical care was necessary to treat or prevent imminent or life-threatening deterioration. Critical care was time spent personally by  me on the following activities: development of treatment plan with patient and/or surrogate as well as nursing, discussions with consultants, evaluation of patient's response to treatment, examination of  patient, obtaining history from patient or surrogate, ordering and performing treatments and interventions, ordering and review of laboratory studies, ordering and review of radiographic studies, pulse oximetry and re-evaluation of patient's condition.  4:31 PM Pt with h/o paroxysmal afib Now with HR >150 and in afib cardizem bolus and drip ordered 8:03 PM Pt with episodes of hypotension in setting of afib with RVR and CHF This did begin to improve, and currently her BP >100 and HR around 100, and cardizem has been stopped for now D/w dr Tressia Miners turner with cardiology.  We discussed her course including afib with RVR with hypotension/CHF She requests admission to hospitalist service D/w dr Elmyra Ricks, will admit to tele Pt appears improved and currently vitals are improved  Labs Review Labs Reviewed  BASIC METABOLIC PANEL - Abnormal; Notable for the following:    Glucose, Bld 143 (*)    BUN 30 (*)    Creatinine, Ser 1.56 (*)    GFR calc non Af Amer 33 (*)    GFR calc Af Amer 38 (*)    All other components within normal limits  CBC WITH DIFFERENTIAL - Abnormal; Notable for the following:    WBC 15.8 (*)    RBC 3.18 (*)    Hemoglobin 9.6 (*)    HCT 29.8 (*)    RDW 16.8 (*)    Neutrophils Relative % 90 (*)    Lymphocytes Relative 6 (*)    Neutro Abs 14.2 (*)    All other components within normal limits  PRO B NATRIURETIC PEPTIDE - Abnormal; Notable for the following:    Pro B Natriuretic peptide (BNP) 4957.0 (*)    All other components within normal limits  POCT I-STAT TROPONIN I   Imaging Review Dg Chest Portable 1 View  01/14/2014   CLINICAL DATA:  Worsening dyspnea, history of emphysema and CHF  EXAM: PORTABLE CHEST - 1 VIEW  COMPARISON:  CT CHEST W/O CM dated 09/09/2010  FINDINGS: The lungs are well-expanded. The interstitial markings are mildly increased. The pulmonary vascularity is prominent centrally and there is mild cephalization. The cardiac silhouette is mildly  enlarged. There is no pleural effusion. There is no alveolar infiltrate. The mediastinum is normal in width. There is tortuosity of the descending thoracic aorta. The observed portions of the bony thorax appear normal.  IMPRESSION: Findings are consistent with CHF superimposed upon COPD. There is no alveolar pneumonia.   Electronically Signed   By: David  Martinique   On: 01/14/2014 16:43    EKG Interpretation    Date/Time:  Wednesday January 14 2014 16:17:25 EST Ventricular Rate:  146 PR Interval:    QRS Duration: 88 QT Interval:  314 QTC Calculation: 489 R Axis:   30 Text Interpretation:  Atrial fibrillation with rapid V-rate Paired ventricular premature complexes Low voltage, precordial leads RSR' in V1 or V2, right VCD or RVH Borderline abnrm T, anterolateral leads Confirmed by [name]  MD, Jocelyn Nold [phone]) on 01/14/2014 4:30:27 PM            MDM   Final diagnoses:  Atrial fibrillation with RVR  Acute CHF  COPD (chronic obstructive pulmonary disease)  Anemia  Renal insufficiency    Nursing notes including past medical history and social history reviewed and considered in documentation xrays reviewed and considered Previous records reviewed and considered - h/o  COPD, h/o afib per records Labs/vital reviewed and considered     Sharyon Cable, MD 01/14/14 2006

## 2014-01-14 NOTE — ED Notes (Signed)
Pt arrived from home by [facility] with c/o SOB. When EMS initially arrived it appeared that pt was having an anxiety attack. Once pt started to calm down EMS listen to lung sounds and it did not sound like any air movement. EMS administered Albuterol 10mg , Atrovent 1mg  and Solu-Medrol 125mg . Lung sounds are clear at this time. Pt also shows afib on the monitor with rate 120-150.

## 2014-01-14 NOTE — ED Notes (Signed)
Before administering Cardizem, obtaining manual BP to make sure automatic BP is correct.

## 2014-01-15 DIAGNOSIS — J438 Other emphysema: Secondary | ICD-10-CM

## 2014-01-15 DIAGNOSIS — J449 Chronic obstructive pulmonary disease, unspecified: Secondary | ICD-10-CM

## 2014-01-15 DIAGNOSIS — I369 Nonrheumatic tricuspid valve disorder, unspecified: Secondary | ICD-10-CM

## 2014-01-15 DIAGNOSIS — R079 Chest pain, unspecified: Secondary | ICD-10-CM

## 2014-01-15 DIAGNOSIS — I4891 Unspecified atrial fibrillation: Secondary | ICD-10-CM | POA: Diagnosis not present

## 2014-01-15 DIAGNOSIS — J9600 Acute respiratory failure, unspecified whether with hypoxia or hypercapnia: Secondary | ICD-10-CM | POA: Diagnosis not present

## 2014-01-15 DIAGNOSIS — I5033 Acute on chronic diastolic (congestive) heart failure: Secondary | ICD-10-CM | POA: Diagnosis not present

## 2014-01-15 DIAGNOSIS — I509 Heart failure, unspecified: Secondary | ICD-10-CM | POA: Diagnosis not present

## 2014-01-15 LAB — CBC
HEMATOCRIT: 30 % — AB (ref 36.0–46.0)
Hemoglobin: 9.4 g/dL — ABNORMAL LOW (ref 12.0–15.0)
MCH: 29.5 pg (ref 26.0–34.0)
MCHC: 31.3 g/dL (ref 30.0–36.0)
MCV: 94 fL (ref 78.0–100.0)
Platelets: 275 10*3/uL (ref 150–400)
RBC: 3.19 MIL/uL — ABNORMAL LOW (ref 3.87–5.11)
RDW: 16.9 % — AB (ref 11.5–15.5)
WBC: 10.8 10*3/uL — ABNORMAL HIGH (ref 4.0–10.5)

## 2014-01-15 LAB — GLUCOSE, CAPILLARY
GLUCOSE-CAPILLARY: 174 mg/dL — AB (ref 70–99)
Glucose-Capillary: 253 mg/dL — ABNORMAL HIGH (ref 70–99)
Glucose-Capillary: 255 mg/dL — ABNORMAL HIGH (ref 70–99)

## 2014-01-15 LAB — BASIC METABOLIC PANEL
BUN: 31 mg/dL — AB (ref 6–23)
CHLORIDE: 99 meq/L (ref 96–112)
CO2: 23 mEq/L (ref 19–32)
CREATININE: 1.34 mg/dL — AB (ref 0.50–1.10)
Calcium: 9.2 mg/dL (ref 8.4–10.5)
GFR, EST AFRICAN AMERICAN: 46 mL/min — AB (ref >90)
GFR, EST NON AFRICAN AMERICAN: 40 mL/min — AB (ref >90)
Glucose, Bld: 326 mg/dL — ABNORMAL HIGH (ref 70–99)
Potassium: 5.1 mEq/L (ref 3.7–5.3)
Sodium: 137 mEq/L (ref 137–147)

## 2014-01-15 MED ORDER — LEVOFLOXACIN IN D5W 750 MG/150ML IV SOLN
750.0000 mg | INTRAVENOUS | Status: DC
  Administered 2014-01-15 – 2014-01-18 (×4): 750 mg via INTRAVENOUS
  Filled 2014-01-15 (×5): qty 150

## 2014-01-15 MED ORDER — METHYLPREDNISOLONE SODIUM SUCC 125 MG IJ SOLR
60.0000 mg | Freq: Three times a day (TID) | INTRAMUSCULAR | Status: DC
  Administered 2014-01-15 – 2014-01-16 (×3): 60 mg via INTRAVENOUS
  Filled 2014-01-15 (×6): qty 0.96

## 2014-01-15 MED ORDER — IPRATROPIUM-ALBUTEROL 0.5-2.5 (3) MG/3ML IN SOLN
3.0000 mL | Freq: Two times a day (BID) | RESPIRATORY_TRACT | Status: DC
  Administered 2014-01-15 – 2014-01-23 (×16): 3 mL via RESPIRATORY_TRACT
  Filled 2014-01-15 (×16): qty 3

## 2014-01-15 MED ORDER — LEVALBUTEROL HCL 1.25 MG/0.5ML IN NEBU
1.2500 mg | INHALATION_SOLUTION | RESPIRATORY_TRACT | Status: DC | PRN
  Administered 2014-01-15 (×2): 1.25 mg via RESPIRATORY_TRACT
  Filled 2014-01-15 (×3): qty 0.5

## 2014-01-15 MED ORDER — FUROSEMIDE 10 MG/ML IJ SOLN
80.0000 mg | Freq: Two times a day (BID) | INTRAMUSCULAR | Status: DC
  Administered 2014-01-15 – 2014-01-16 (×2): 80 mg via INTRAVENOUS
  Filled 2014-01-15 (×3): qty 8

## 2014-01-15 MED ORDER — INSULIN ASPART 100 UNIT/ML ~~LOC~~ SOLN
0.0000 [IU] | Freq: Three times a day (TID) | SUBCUTANEOUS | Status: DC
  Administered 2014-01-15: 2 [IU] via SUBCUTANEOUS
  Administered 2014-01-15 – 2014-01-16 (×2): 5 [IU] via SUBCUTANEOUS
  Administered 2014-01-16: 7 [IU] via SUBCUTANEOUS
  Administered 2014-01-16: 1 [IU] via SUBCUTANEOUS

## 2014-01-15 NOTE — Progress Notes (Signed)
Utilization review completed. Siennah Barrasso, RN, BSN. 

## 2014-01-15 NOTE — Progress Notes (Signed)
PT Cancellation Note  Patient Details Name: KESLYN TEATER MRN: [id number] DOB: Oct 04, 1946   Cancelled Treatment:    Reason Eval/Treat Not Completed: Other (comment) (repiratory treatment and pt just got dinner. ) PT to check back tomorrow.    Thanks,  Barbarann Ehlers. [place], [place], DPT [phone]   01/15/2014, 5:44 PM

## 2014-01-15 NOTE — Progress Notes (Signed)
Patient: Paige Hardin / Admit Date: 01/14/2014 / Date of Encounter: 01/15/2014, 12:28 PM   Subjective  SOB improved from yesterday but still quite SOB with any movement or even sitting up in bed. She doesn't think she's urinated as much as she does at home. States trace LEE is baseline for her. Weights variable over last years in Epic - is 302 yesterday, was 318 in 09/2013.  Objective   Telemetry: converted to NSR last night  2D Echo 01/15/14 - Left ventricle: The cavity size was mildly dilated. Wall thickness was increased in a pattern of mild LVH. The estimated ejection fraction was 60%. Wall motion was normal; there were no regional wall motion abnormalities. Findings consistent with left ventricular diastolic dysfunction. Doppler parameters are consistent with high ventricular filling pressure. - Aortic valve: Sclerosis without stenosis. No significant regurgitation. - Mitral valve: Mildly calcified annulus. Mildly thickened leaflets . Mild regurgitation. - Left atrium: The atrium was mildly to moderately dilated. - Right atrium: The atrium was mildly dilated. - Pulmonary arteries: PA peak pressure: 7mm Hg (S). - Inferior vena cava: The vessel was dilated; the respirophasic diameter changes were blunted (< 50%); findings are consistent with elevated central venous pressure.  Physical Exam: Blood pressure 124/46, pulse 93, temperature 98.2 F (36.8 C), temperature source Oral, resp. rate 20, height 5\' 5"  (1.651 m), weight 302 lb 7.5 oz (137.2 kg), SpO2 97.00%. General: Well developed obese WF in mild-mod distress with accessorry muscle use but states "this is how I usually breathe" Head: Normocephalic, atraumatic, sclera non-icteric, no xanthomas, nares are without discharge. Neck: Negative for carotid bruits. JVP not elevated. Lungs: Markedly diminished BS throughout. Heart: RRR S1 S2 without murmurs, rubs, or gallops.  Abdomen: Soft, non-tender, non-distended with  normoactive bowel sounds. No rebound/guarding. Extremities: No clubbing or cyanosis. Tr edema. Distal pedal pulses are 2+ and equal bilaterally. Neuro: Alert and oriented X 3. Moves all extremities spontaneously. Psych:  Responds to questions appropriately with a normal affect.   Intake/Output Summary (Last 24 hours) at 01/15/14 1228 Last data filed at 01/15/14 1100  Gross per 24 hour  Intake    360 ml  Output      0 ml  Net    360 ml    Inpatient Medications:  . acamprosate  666 mg Oral TID WC  . ezetimibe  10 mg Oral Daily   And  . atorvastatin  20 mg Oral q1800  . budesonide-formoterol  2 puff Inhalation BID  . cholecalciferol  1,000 Units Oral Daily  . dabigatran  150 mg Oral Q12H  . diltiazem  120 mg Oral Q12H  . furosemide  40 mg Intravenous BID  . insulin aspart  0-9 Units Subcutaneous TID WC  . ipratropium-albuterol  3 mL Nebulization BID  . irbesartan  75 mg Oral Daily  . levofloxacin (LEVAQUIN) IV  750 mg Intravenous Q24H  . methylPREDNISolone (SOLU-MEDROL) injection  60 mg Intravenous 3 times per day  . vitamin C  500 mg Oral Daily  . vitamin E  400 Units Oral Daily   Infusions:    Labs:  Recent Labs  01/14/14 1705 01/15/14 0505  NA 140 137  K 4.5 5.1  CL 101 99  CO2 24 23  GLUCOSE 143* 326*  BUN 30* 31*  CREATININE 1.56* 1.34*  CALCIUM 9.7 9.2    Recent Labs  01/14/14 1812 01/15/14 0505  WBC 15.8* 10.8*  NEUTROABS 14.2*  --   HGB 9.6* 9.4*  HCT 29.8* 30.0*  MCV 93.7 94.0  PLT 295 275     Radiology/Studies:  Dg Chest Portable 1 View  01/14/2014   CLINICAL DATA:  Worsening dyspnea, history of emphysema and CHF  EXAM: PORTABLE CHEST - 1 VIEW  COMPARISON:  CT CHEST W/O CM dated 09/09/2010  FINDINGS: The lungs are well-expanded. The interstitial markings are mildly increased. The pulmonary vascularity is prominent centrally and there is mild cephalization. The cardiac silhouette is mildly enlarged. There is no pleural effusion. There is no  alveolar infiltrate. The mediastinum is normal in width. There is tortuosity of the descending thoracic aorta. The observed portions of the bony thorax appear normal.  IMPRESSION: Findings are consistent with CHF superimposed upon COPD. There is no alveolar pneumonia.   Electronically Signed   By: David  Martinique   On: 01/14/2014 16:43     Assessment and Plan  1. PAF, episode likely triggered by COPD exacerbation - converted to NSR 2. Acute on chronic diastolic CHF most likely exacerbated by COPD exacerbation and rapid afib  3. Acute respiratory failure secondary to COPD exacerbation and acute diastolic CHF  4. Acute on possibly CKD (Cr 1.2 in 2011), 1.56 on adm, improving 5. Anemia, new from 2010 lab but 2013 references decline - 2013: EGD: candida esophagitis, hematin. Colonoscopy: multiple polyps, int/ext hemorrhoids, diverticula. Repeat recommended 3 yrs 6. DM type II  7. HTN 8. Elevated PA pressure, question secondary to underlying lung disease 9. Morbid obesity Body mass index is 50.33 kg/(m^2).  Continue current diltiazem dose. At home it says she was taking the SR version once per day but is tolerating it twice a day here so far. Would continue this twice a day at home (or consider consolidating to CD). Continue Pradaxa, but will defer to IM regarding possible further workup of anemia. Recent baseline not established, see above regarding 2013 GI w/u. Will discuss IV Lasix dose with MD - add monitoring parameters of weights, I/Os.  Needs optimization of COPD status. Will change PRN neb to xopenex and ask to administer now. She is on steroids, abx. May need pulm eval as inpatient. Check flu status.  Signed, Melina Copa PA-C  I have seen and examined the patient along with Melina Copa, PA.  I have reviewed the chart, notes and new data.  I agree with PA's note.  Key new complaints: No longer dyspneic while at rest, profoundly dyspneic with minimal movement. Describes orthopnea for "years" - 3  pillows at home Key examination changes: no rales or wheezes, minimal edema, NSR on monitor. Obesity makes it hard to detect signs of hypervolemia Key new findings / data: Echo Doppler findings and elevated pro BNP are consistent with acute heart failure (but little to compare to - last BNP was not a proBNP and is 68 years old). CXR  PLAN: Difficult to discern whether her dyspnea is cardiac or pulmonary in etiology, but I think there is substantial suspicion that acute diastolic HF is playing a partial role. Would boost diuretic dose. I would not start antiarrhythmics. Use diltiazem for rate control. Continue Pradaxa.  Sanda Klein, MD, [place] [phone] 01/15/2014, 12:47 PM

## 2014-01-15 NOTE — Progress Notes (Signed)
  Echocardiogram 2D Echocardiogram has been performed.  Mauricio Po 01/15/2014, 10:30 AM

## 2014-01-15 NOTE — Progress Notes (Signed)
Inpatient Diabetes Program Recommendations  AACE/ADA: New Consensus Statement on Inpatient Glycemic Control (2013)  Target Ranges:  Prepandial:   less than 140 mg/dL      Peak postprandial:   less than 180 mg/dL (1-2 hours)      Critically ill patients:  140 - 180 mg/dL    Results for ETTER, ROYALL (MRN [id number]) as of 01/15/2014 08:45  Ref. Range 01/14/2014 21:23  Glucose-Capillary Latest Range: 70-99 mg/dL 262 (H)    Results for CHITARA, CLONCH (MRN [id number]) as of 01/15/2014 08:45  Ref. Range 01/15/2014 05:05  Glucose Latest Range: 70-99 mg/dL 326 (H)    Diabetes history: Type 2 DM Outpatient Diabetes medications: Glyburide (Glynase) 3mg  QD  + Actos 45 mg QD Current orders for Inpatient glycemic control: Home Glyburide dose   **Note patient admitted with SOB.  Started on IV Solumedrol last night at ~1am.  Current IV Solumedrol orders are for 80 mg tid.  Lab glucose this morning 326 mg/dl.   **MD- Please consider the following:  1. Start Novolog Resistant SSI tid ac +HS  (patient is getting steroids and weighs 137 kg) 2. If patient starts SSI and continues to have significant glucose elevations, please consider starting basal insulin at 0.2 units/kg dosing (Lantus 25 units QHS)   Will follow. Wyn Quaker RN, MSN, CDE Diabetes Coordinator Inpatient Diabetes Program Team Pager: [phone] (8a-10p)

## 2014-01-15 NOTE — Progress Notes (Addendum)
[facility] PROGRESS NOTE  Paige Hardin JQB:[id number] DOB: 07/15/1946 DOA: 01/14/2014 PCP: Stephens Shire, MD  Assessment/Plan:  Active Problems:  Acute respiratory failure  HYPERLIPIDEMIA-MIXED  DIASTOLIC HEART FAILURE, CHRONIC  COPD (chronic obstructive pulmonary disease) with emphysema  ATRIAL FIBRILLATION, HX OF   68 y/o female with PMH of HTN, HPL, COPD, CKD, DM, CHF AFIb on pradaxa presented with progressive SOB, admitted with CHF, COPD exacerbation   1. COPD exacerbation;  -stated on IV steroids, bronchodilators, oxygen, add IV atx;    2. CHF exacerbation likely diastolic HF; echo (4097): LVEF 75% -cont IV diuretics; monitor daily weight, I/O; repeat echo; appreciate cardiology input; d/c pioglitozone   3. Acute respiratory failure: as above, desat study prior to discharge   4. A. fib with RVR: Rate improved after one IV Cardizem push; cont PO meds  5. Hyperlipidemia: Continue with Vytorin  6. DM no recent HA1C;  -start ISS for now; d/c pioglitozone with CHF; recheck HA1C;   Code Status: DNR Family Communication: d/w patient, updated Coello,Johnny Spouse [phone]  (indicate person spoken with, relationship, and if by phone, the number) Disposition Plan: pend clinical improvement    Consultants:  Cardiology   Procedures:  None   Antibiotics:  levofloxacin 2/19<<<<(indicate start date, and stop date if known)  HPI/Subjective: alert  Objective: Filed Vitals:   01/15/14 0619  BP: 148/48  Pulse: 95  Temp: 98.2 F (36.8 C)  Resp: 20   No intake or output data in the 24 hours ending 01/15/14 0837 Filed Weights   01/14/14 2118  Weight: 137.2 kg (302 lb 7.5 oz)    Exam:   General:  alert  Cardiovascular: s1,s2 rrr  Respiratory: few wheezing in LL  Abdomen: soft, nt, nd   Musculoskeletal: LE edema   Data Reviewed: Basic Metabolic Panel:  Recent Labs Lab 01/14/14 1705 01/15/14 0505  NA 140 137  K 4.5 5.1  CL 101 99  CO2 24  23  GLUCOSE 143* 326*  BUN 30* 31*  CREATININE 1.56* 1.34*  CALCIUM 9.7 9.2   Liver Function Tests: No results found for this basename: AST, ALT, ALKPHOS, BILITOT, PROT, ALBUMIN,  in the last 168 hours No results found for this basename: LIPASE, AMYLASE,  in the last 168 hours No results found for this basename: AMMONIA,  in the last 168 hours CBC:  Recent Labs Lab 01/14/14 1812 01/15/14 0505  WBC 15.8* 10.8*  NEUTROABS 14.2*  --   HGB 9.6* 9.4*  HCT 29.8* 30.0*  MCV 93.7 94.0  PLT 295 275   Cardiac Enzymes: No results found for this basename: CKTOTAL, CKMB, CKMBINDEX, TROPONINI,  in the last 168 hours BNP (last 3 results)  Recent Labs  01/14/14 1705  PROBNP 4957.0*   CBG:  Recent Labs Lab 01/14/14 2123  GLUCAP 262*    No results found for this or any previous visit (from the past 240 hour(s)).   Studies: Dg Chest Portable 1 View  01/14/2014   CLINICAL DATA:  Worsening dyspnea, history of emphysema and CHF  EXAM: PORTABLE CHEST - 1 VIEW  COMPARISON:  CT CHEST W/O CM dated 09/09/2010  FINDINGS: The lungs are well-expanded. The interstitial markings are mildly increased. The pulmonary vascularity is prominent centrally and there is mild cephalization. The cardiac silhouette is mildly enlarged. There is no pleural effusion. There is no alveolar infiltrate. The mediastinum is normal in width. There is tortuosity of the descending thoracic aorta. The observed portions of the bony thorax appear normal.  IMPRESSION: Findings are consistent with CHF superimposed upon COPD. There is no alveolar pneumonia.   Electronically Signed   By: David  Martinique   On: 01/14/2014 16:43    Scheduled Meds: . acamprosate  666 mg Oral TID WC  . ezetimibe  10 mg Oral Daily   And  . atorvastatin  20 mg Oral q1800  . budesonide-formoterol  2 puff Inhalation BID  . cholecalciferol  1,000 Units Oral Daily  . dabigatran  150 mg Oral Q12H  . diltiazem  120 mg Oral Q12H  . furosemide  40 mg  Intravenous BID  . glyBURIDE micronized  3 mg Oral Q breakfast  . ipratropium-albuterol  3 mL Nebulization BID  . irbesartan  75 mg Oral Daily  . methylPREDNISolone (SOLU-MEDROL) injection  80 mg Intravenous 3 times per day  . vitamin C  500 mg Oral Daily  . vitamin E  400 Units Oral Daily   Continuous Infusions:   Active Problems:   HYPERLIPIDEMIA-MIXED   DIASTOLIC HEART FAILURE, CHRONIC   COPD (chronic obstructive pulmonary disease) with emphysema   ATRIAL FIBRILLATION, HX OF   Acute respiratory failure   Acute on chronic diastolic congestive heart failure    Time spent: >35 minutes     Kinnie Feil  [facility] Pager [phone]. If 7PM-7AM, please contact night-coverage at [email or web address], password [facility] 01/15/2014, 8:37 AM  LOS: 1 day

## 2014-01-16 DIAGNOSIS — D649 Anemia, unspecified: Secondary | ICD-10-CM

## 2014-01-16 DIAGNOSIS — I509 Heart failure, unspecified: Secondary | ICD-10-CM | POA: Diagnosis not present

## 2014-01-16 DIAGNOSIS — I5033 Acute on chronic diastolic (congestive) heart failure: Secondary | ICD-10-CM | POA: Diagnosis not present

## 2014-01-16 DIAGNOSIS — N289 Disorder of kidney and ureter, unspecified: Secondary | ICD-10-CM

## 2014-01-16 DIAGNOSIS — I4891 Unspecified atrial fibrillation: Secondary | ICD-10-CM | POA: Diagnosis not present

## 2014-01-16 DIAGNOSIS — J9600 Acute respiratory failure, unspecified whether with hypoxia or hypercapnia: Secondary | ICD-10-CM | POA: Diagnosis not present

## 2014-01-16 LAB — RESPIRATORY VIRUS PANEL
ADENOVIRUS: NOT DETECTED
INFLUENZA A H1: NOT DETECTED
INFLUENZA A H3: NOT DETECTED
INFLUENZA A: NOT DETECTED
Influenza B: NOT DETECTED
Metapneumovirus: NOT DETECTED
PARAINFLUENZA 2 A: NOT DETECTED
Parainfluenza 1: NOT DETECTED
Parainfluenza 3: NOT DETECTED
RESPIRATORY SYNCYTIAL VIRUS A: NOT DETECTED
RESPIRATORY SYNCYTIAL VIRUS B: NOT DETECTED
RHINOVIRUS: NOT DETECTED

## 2014-01-16 LAB — GLUCOSE, CAPILLARY
Glucose-Capillary: 287 mg/dL — ABNORMAL HIGH (ref 70–99)
Glucose-Capillary: 322 mg/dL — ABNORMAL HIGH (ref 70–99)
Glucose-Capillary: 139 mg/dL — ABNORMAL HIGH (ref 70–99)
Glucose-Capillary: 229 mg/dL — ABNORMAL HIGH (ref 70–99)

## 2014-01-16 LAB — BASIC METABOLIC PANEL
BUN: 45 mg/dL — ABNORMAL HIGH (ref 6–23)
CO2: 23 mEq/L (ref 19–32)
CREATININE: 1.65 mg/dL — AB (ref 0.50–1.10)
Calcium: 8.9 mg/dL (ref 8.4–10.5)
Chloride: 96 mEq/L (ref 96–112)
GFR, EST AFRICAN AMERICAN: 36 mL/min — AB (ref >90)
GFR, EST NON AFRICAN AMERICAN: 31 mL/min — AB (ref >90)
Glucose, Bld: 300 mg/dL — ABNORMAL HIGH (ref 70–99)
Potassium: 5 mEq/L (ref 3.7–5.3)
Sodium: 136 mEq/L — ABNORMAL LOW (ref 137–147)

## 2014-01-16 LAB — HEMOGLOBIN A1C
Hgb A1c MFr Bld: 6.1 % — ABNORMAL HIGH (ref <5.7)
Mean Plasma Glucose: 128 mg/dL — ABNORMAL HIGH (ref <117)

## 2014-01-16 MED ORDER — FUROSEMIDE 40 MG PO TABS
40.0000 mg | ORAL_TABLET | Freq: Every day | ORAL | Status: DC
  Administered 2014-01-16 – 2014-01-19 (×4): 40 mg via ORAL
  Filled 2014-01-16 (×4): qty 1

## 2014-01-16 MED ORDER — METHYLPREDNISOLONE SODIUM SUCC 40 MG IJ SOLR
40.0000 mg | Freq: Three times a day (TID) | INTRAMUSCULAR | Status: DC
  Administered 2014-01-16 – 2014-01-17 (×3): 40 mg via INTRAVENOUS
  Filled 2014-01-16 (×6): qty 1

## 2014-01-16 MED ORDER — OXYCODONE-ACETAMINOPHEN 5-325 MG PO TABS
1.0000 | ORAL_TABLET | Freq: Four times a day (QID) | ORAL | Status: DC | PRN
  Administered 2014-01-16 – 2014-01-22 (×12): 1 via ORAL
  Filled 2014-01-16 (×12): qty 1

## 2014-01-16 NOTE — Evaluation (Signed)
Physical Therapy Evaluation Patient Details Name: Paige Hardin MRN: [id number] DOB: 02/16/1946 Today's Date: 01/16/2014 Time: 5638-7564 PT Time Calculation (min): 25 min  PT Assessment / Plan / Recommendation History of Present Illness  68 y.o. female admitted to [facility] on 01/14/14 with history of COPD, diastolic CHF, active fibrillation on pradaxa , not using any home oxygen, he presents with complaints of shortness of breath, cough, nonproductive, patient was noticed to be hypoxic in ED, requiring 3 L nasal cannula, had significant wheezing, improved after receiving nebulizer treatment, her chest x-ray showing a vascular cause congestion, as well patient complains of worsening lower extremity edema, she is known to have history of diastolic CHF, the patient was noticed to be in A. fib with RVR.    Clinical Impression  Pt is extremely deconditioned with very poor respiratory tolerance to mobility and walking.  O2 sats remained stable at 90% on RA with gait, however, her DOE increased to 3-4/4 with just 20' of walking.  She will be unable to function safely at this level at home.  She is aware of this and knows that she needs to seek SNF level rehab at d/c and is interested in possibly transitioning to an ALF afterwards.   PT to follow acutely for deficits listed below.       PT Assessment  Patient needs continued PT services    Follow Up Recommendations  SNF    Does the patient have the potential to tolerate intense rehabilitation     NA  Barriers to Discharge Inaccessible home environment;Decreased caregiver support Pt is not able to function on her own at home safely at this point in time.      Equipment Recommendations  Rolling walker with 5" wheels;Other (comment) (bariatric RW)    Recommendations for Other Services   NA  Frequency Min 2X/week    Precautions / Restrictions Precautions Precautions: Fall;Other (comment) Precaution Comments: Monitor O2 sats on RA   Pertinent  Vitals/Pain O2 sats on RA at rest 96%, with mobility 90%, DOE 3-4/4 with gait.  HR stable.       Mobility  Transfers Overall transfer level: Needs assistance Equipment used: Rolling walker (2 wheeled) Transfers: Sit to/from Stand Sit to Stand: Min guard General transfer comment: Min guard assist for safety Ambulation/Gait Ambulation/Gait assistance: Min guard Ambulation Distance (Feet): 20 Feet Assistive device: Rolling walker (2 wheeled) Gait Pattern/deviations: Step-through pattern;Shuffle;Trunk flexed;Decreased step length - right Gait velocity: decreased Gait velocity interpretation: Below normal speed for age/gender General Gait Details: PT very flexed posture with RW pushed far forward likely because her body habitus cannot fit inside the RW allowing her to stand up straight.  Pt with very poor activity tolerance as DOE increased to 3-4/4 with just 20' of walking.  Improved quickly once sitting and surprisingly O2 sats remained 90-96% on RA.  O2 [employer] re-applied in sitting after gait.  Pt preforming pursed lip breathing once resting.      Exercises General Exercises - Lower Extremity Ankle Circles/Pumps: AROM;Both;10 reps;Seated Long Arc Quad: AROM;Left;10 reps;Seated (unable on the right) Heel Slides: AROM;Right;5 reps;Seated Hip ABduction/ADduction: AROM;Both;10 reps;Seated Hip Flexion/Marching: AROM;Left;10 reps;Seated (unable to on right) Toe Raises: AROM;Both;10 reps;Seated Heel Raises: AROM;Both;10 reps;Seated Other Exercises Other Exercises: pt given HEP exercise handout.    PT Diagnosis: Difficulty walking;Abnormality of gait;Generalized weakness;Acute pain  PT Problem List: Decreased strength;Decreased activity tolerance;Decreased balance;Decreased mobility;Decreased knowledge of use of DME;Cardiopulmonary status limiting activity;Pain;Obesity PT Treatment Interventions: DME instruction;Gait training;Functional mobility training;Therapeutic activities;Therapeutic  exercise;Balance training;Neuromuscular  re-education;Patient/family education;Modalities     PT Goals(Current goals can be found in the care plan section) Acute Rehab PT Goals Patient Stated Goal: to get better and stronger.  To maybe move to an assisted living apartment after rehab.   PT Goal Formulation: With patient Time For Goal Achievement: 01/30/14 Potential to Achieve Goals: Good  Visit Information  Last PT Received On: 01/16/14 Assistance Needed: +1 History of Present Illness: 68 y.o. female admitted to [facility] on 01/14/14 with history of COPD, diastolic CHF, active fibrillation on pradaxa , not using any home oxygen, he presents with complaints of shortness of breath, cough, nonproductive, patient was noticed to be hypoxic in ED, requiring 3 L nasal cannula, had significant wheezing, improved after receiving nebulizer treatment, her chest x-ray showing a vascular cause congestion, as well patient complains of worsening lower extremity edema, she is known to have history of diastolic CHF, the patient was noticed to be in A. fib with RVR.         Prior [place] expects to be discharged to:: Private residence Living Arrangements: Alone Available Help at Discharge: Family;Other (Comment) (x- husband gets her groceries) Type of Home: House Home Access: Stairs to enter [employer] of Steps: 3 Entrance Stairs-Rails: Right Home Layout: One level;Laundry or work area in [place]: Kasandra Knudsen - single point Prior Function Level of Independence: Independent with assistive device(s) Comments: PTA pt with very limited short-distance gait and DOE with exertion that would resolve with seated rest break.  Pt has a hot water heater in her basement and once a week she would go down and turn it on so that she could warm some water for a sponge bath.  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  Communication Communication: No  difficulties Dominant Hand: Right    Cognition  Cognition Arousal/Alertness: Awake/alert Behavior During Therapy: Flat affect (seems depressed, has no support network) Overall Cognitive Status: Within Functional Limits for tasks assessed    Extremity/Trunk Assessment Upper Extremity Assessment Upper Extremity Assessment: Generalized weakness Lower Extremity Assessment Lower Extremity Assessment: RLE deficits/detail;LLE deficits/detail RLE Deficits / Details: right leg weaker than left leg, sounds like some radicular nerve pain from low back (increased pain with cough) weakness in L3 nerve pattern as well as pain.  Unable to lift her right knee against gravity in sitting, unable to preform LAQ in sitting.  Ankle strength WNL.    RLE Sensation: decreased light touch (in L3 dermatome) LLE Deficits / Details: generalized weakness left leg, but significantly stronger than right leg.  Cervical / Trunk Assessment Cervical / Trunk Assessment: Normal   Balance Balance Overall balance assessment: Needs assistance Sitting-balance support: No upper extremity supported;Feet supported Sitting balance-Leahy Scale: Fair Standing balance support: Bilateral upper extremity supported Standing balance-Leahy Scale: Poor  End of Session PT - End of Session Activity Tolerance: Patient limited by fatigue;Patient limited by pain Patient left: in bed;with call bell/phone within reach;Other (comment) (seated EOB. )    Wells Guiles B. [place], [place], DPT [phone]   01/16/2014, 12:54 PM

## 2014-01-16 NOTE — Progress Notes (Signed)
Inpatient Diabetes Program Recommendations  AACE/ADA: New Consensus Statement on Inpatient Glycemic Control (2013)  Target Ranges:  Prepandial:   less than 140 mg/dL      Peak postprandial:   less than 180 mg/dL (1-2 hours)      Critically ill patients:  140 - 180 mg/dL   Diabetes history: Type 2 DM  Outpatient Diabetes medications: Glyburide (Glynase) 3mg  QD + Actos 45 mg QD  Current orders for Inpatient glycemic control: Home Glyburide dose and sensitive Novolog Correction  Note: Solu Medrol reduced from 80 mg tid to 60 mg tid yesterday.  **MD- Please consider the following:  1. Start Novolog Resistant SSI tid ac +HS (patient is getting steroids and weighs 137 kg)  2. If patient starts resistant SSI and continues to have significant glucose elevations, please consider starting basal insulin at 0.2 units/kg dosing (Lantus 25 units QHS) Thank you.  Paige Hardin S. Paige Mates, RN, CNS, CDE Inpatient Diabetes Program, team pager [phone]

## 2014-01-16 NOTE — Progress Notes (Addendum)
[facility] PROGRESS NOTE  JOELENE BARRIERE JIR:[id number] DOB: 04-01-1946 DOA: 01/14/2014 PCP: Stephens Shire, MD  Assessment/Plan:  Active Problems:  Acute respiratory failure  HYPERLIPIDEMIA-MIXED  DIASTOLIC HEART FAILURE, CHRONIC  COPD (chronic obstructive pulmonary disease) with emphysema  ATRIAL FIBRILLATION, HX OF   68 y/o female with PMH of HTN, HPL, COPD, CKD, DM, CHF AFIb on pradaxa presented with progressive SOB, admitted with CHF, COPD exacerbation   1. COPD exacerbation;  -cont IV steroids, bronchodilators, oxygen, add IV atx;    2. CHF exacerbation likely diastolic HF; echo: LVEF 75%, diastolic HF -per cardiology; on IV diuretics; monitor daily weight, I/O; appreciate cardiology input; d/c pioglitozone   3. Acute respiratory failure: as above, desat study prior to discharge   4. A. fib with RVR: cont Cardizem   5. Hyperlipidemia: Continue with Vytorin  6. DM HA1C-6.1 -start ISS for now; d/c pioglitozone with CHF;   Code Status: DNR Family Communication: d/w patient, updated Grabel,Johnny Spouse [phone]  (indicate person spoken with, relationship, and if by phone, the number) Disposition Plan: pend clinical improvement    Consultants:  Cardiology   Procedures:  None   Antibiotics:  levofloxacin 2/19<<<<(indicate start date, and stop date if known)  HPI/Subjective: alert  Objective: Filed Vitals:   01/16/14 0543  BP: 119/50  Pulse: 83  Temp: 98.5 F (36.9 C)  Resp: 22    Intake/Output Summary (Last 24 hours) at 01/16/14 1006 Last data filed at 01/15/14 2335  Gross per 24 hour  Intake    870 ml  Output    300 ml  Net    570 ml   Filed Weights   01/14/14 2118  Weight: 137.2 kg (302 lb 7.5 oz)    Exam:   General:  alert  Cardiovascular: s1,s2 rrr  Respiratory: few wheezing in LL  Abdomen: soft, nt, nd   Musculoskeletal: LE edema   Data Reviewed: Basic Metabolic Panel:  Recent Labs Lab 01/14/14 1705 01/15/14 0505  01/16/14 0347  NA 140 137 136*  K 4.5 5.1 5.0  CL 101 99 96  CO2 24 23 23   GLUCOSE 143* 326* 300*  BUN 30* 31* 45*  CREATININE 1.56* 1.34* 1.65*  CALCIUM 9.7 9.2 8.9   Liver Function Tests: No results found for this basename: AST, ALT, ALKPHOS, BILITOT, PROT, ALBUMIN,  in the last 168 hours No results found for this basename: LIPASE, AMYLASE,  in the last 168 hours No results found for this basename: AMMONIA,  in the last 168 hours CBC:  Recent Labs Lab 01/14/14 1812 01/15/14 0505  WBC 15.8* 10.8*  NEUTROABS 14.2*  --   HGB 9.6* 9.4*  HCT 29.8* 30.0*  MCV 93.7 94.0  PLT 295 275   Cardiac Enzymes: No results found for this basename: CKTOTAL, CKMB, CKMBINDEX, TROPONINI,  in the last 168 hours BNP (last 3 results)  Recent Labs  01/14/14 1705  PROBNP 4957.0*   CBG:  Recent Labs Lab 01/14/14 2123 01/15/14 1117 01/15/14 1625 01/15/14 2055 01/16/14 0801  GLUCAP 262* 253* 174* 255* 287*    No results found for this or any previous visit (from the past 240 hour(s)).   Studies: Dg Chest Portable 1 View  01/14/2014   CLINICAL DATA:  Worsening dyspnea, history of emphysema and CHF  EXAM: PORTABLE CHEST - 1 VIEW  COMPARISON:  CT CHEST W/O CM dated 09/09/2010  FINDINGS: The lungs are well-expanded. The interstitial markings are mildly increased. The pulmonary vascularity is prominent centrally and there is mild  cephalization. The cardiac silhouette is mildly enlarged. There is no pleural effusion. There is no alveolar infiltrate. The mediastinum is normal in width. There is tortuosity of the descending thoracic aorta. The observed portions of the bony thorax appear normal.  IMPRESSION: Findings are consistent with CHF superimposed upon COPD. There is no alveolar pneumonia.   Electronically Signed   By: David  Martinique   On: 01/14/2014 16:43    Scheduled Meds: . acamprosate  666 mg Oral TID WC  . ezetimibe  10 mg Oral Daily   And  . atorvastatin  20 mg Oral q1800  .  budesonide-formoterol  2 puff Inhalation BID  . cholecalciferol  1,000 Units Oral Daily  . dabigatran  150 mg Oral Q12H  . diltiazem  120 mg Oral Q12H  . furosemide  80 mg Intravenous BID  . insulin aspart  0-9 Units Subcutaneous TID WC  . ipratropium-albuterol  3 mL Nebulization BID  . irbesartan  75 mg Oral Daily  . levofloxacin (LEVAQUIN) IV  750 mg Intravenous Q24H  . methylPREDNISolone (SOLU-MEDROL) injection  60 mg Intravenous 3 times per day  . vitamin C  500 mg Oral Daily  . vitamin E  400 Units Oral Daily   Continuous Infusions:   Active Problems:   HYPERLIPIDEMIA-MIXED   COPD (chronic obstructive pulmonary disease) with emphysema   PAF (paroxysmal atrial fibrillation)   Acute respiratory failure   Acute on chronic diastolic congestive heart failure    Time spent: >35 minutes     Kinnie Feil  [facility] Pager [phone]. If 7PM-7AM, please contact night-coverage at [email or web address], password [facility] 01/16/2014, 10:06 AM  LOS: 2 days

## 2014-01-16 NOTE — Progress Notes (Signed)
Patient Name: Paige Hardin Date of Encounter: 01/16/2014  Active Problems:   HYPERLIPIDEMIA-MIXED   COPD (chronic obstructive pulmonary disease) with emphysema   PAF (paroxysmal atrial fibrillation)   Acute respiratory failure   Acute on chronic diastolic congestive heart failure   Length of Stay: 2  SUBJECTIVE  She feels a little better despite no net diuresis since yesterday. Oxygenation better. WBC down. Renal function has deteriorated.  CURRENT MEDS . acamprosate  666 mg Oral TID WC  . ezetimibe  10 mg Oral Daily   And  . atorvastatin  20 mg Oral q1800  . budesonide-formoterol  2 puff Inhalation BID  . cholecalciferol  1,000 Units Oral Daily  . dabigatran  150 mg Oral Q12H  . diltiazem  120 mg Oral Q12H  . furosemide  80 mg Intravenous BID  . insulin aspart  0-9 Units Subcutaneous TID WC  . ipratropium-albuterol  3 mL Nebulization BID  . irbesartan  75 mg Oral Daily  . levofloxacin (LEVAQUIN) IV  750 mg Intravenous Q24H  . methylPREDNISolone (SOLU-MEDROL) injection  40 mg Intravenous 3 times per day  . vitamin C  500 mg Oral Daily  . vitamin E  400 Units Oral Daily    OBJECTIVE   Intake/Output Summary (Last 24 hours) at 01/16/14 1137 Last data filed at 01/15/14 2335  Gross per 24 hour  Intake    510 ml  Output    300 ml  Net    210 ml   Filed Weights   01/14/14 2118  Weight: 137.2 kg (302 lb 7.5 oz)    PHYSICAL EXAM Filed Vitals:   01/15/14 1343 01/15/14 2002 01/16/14 0543 01/16/14 0807  BP: 150/75 127/75 119/50   Pulse: 96 87 83   Temp: 98.1 F (36.7 C) 97.8 F (36.6 C) 98.5 F (36.9 C)   TempSrc: Oral Oral Oral   Resp: 21 18 22    Height:      Weight:      SpO2: 100% 96% 96% 94%   General: Alert, oriented x3, no distress Head: no evidence of trauma, PERRL, EOMI, no exophtalmos or lid lag, no myxedema, no xanthelasma; normal ears, nose and oropharynx Neck: normal jugular venous pulsations and no hepatojugular reflux; brisk carotid pulses  without delay and no carotid bruits Chest: cough with wheezing, no signs of consolidation by percussion or palpation, normal fremitus, symmetrical and full respiratory excursions Cardiovascular: normal position and quality of the apical impulse, regular rhythm, normal first and second heart sounds, no rubs or gallops, no murmur Abdomen: no tenderness or distention, no masses by palpation, no abnormal pulsatility or arterial bruits, normal bowel sounds, no hepatosplenomegaly Extremities: no clubbing, cyanosis or edema; 2+ radial, ulnar and brachial pulses bilaterally; 2+ right femoral, posterior tibial and dorsalis pedis pulses; 2+ left femoral, posterior tibial and dorsalis pedis pulses; no subclavian or femoral bruits Neurological: grossly nonfocal  LABS  CBC  Recent Labs  01/14/14 1812 01/15/14 0505  WBC 15.8* 10.8*  NEUTROABS 14.2*  --   HGB 9.6* 9.4*  HCT 29.8* 30.0*  MCV 93.7 94.0  PLT 295 161   Basic Metabolic Panel  Recent Labs  01/15/14 0505 01/16/14 0347  NA 137 136*  K 5.1 5.0  CL 99 96  CO2 23 23  GLUCOSE 326* 300*  BUN 31* 45*  CREATININE 1.34* 1.65*  CALCIUM 9.2 8.9   Liver Function Tests No results found for this basename: AST, ALT, ALKPHOS, BILITOT, PROT, ALBUMIN,  in the last 72 hours  No results found for this basename: LIPASE, AMYLASE,  in the last 72 hours Cardiac Enzymes No results found for this basename: CKTOTAL, CKMB, CKMBINDEX, TROPONINI,  in the last 72 hours BNP No components found with this basename: POCBNP,  D-Dimer No results found for this basename: DDIMER,  in the last 72 hours Hemoglobin A1C  Recent Labs  01/15/14 1140  HGBA1C 6.1*   Fasting Lipid Panel No results found for this basename: CHOL, HDL, LDLCALC, TRIG, CHOLHDL, LDLDIRECT,  in the last 72 hours Thyroid Function Tests No results found for this basename: TSH, T4TOTAL, FREET3, T3FREE, THYROIDAB,  in the last 72 hours  Radiology Studies Imaging results have been reviewed  and Dg Chest Portable 1 View  01/14/2014   CLINICAL DATA:  Worsening dyspnea, history of emphysema and CHF  EXAM: PORTABLE CHEST - 1 VIEW  COMPARISON:  CT CHEST W/O CM dated 09/09/2010  FINDINGS: The lungs are well-expanded. The interstitial markings are mildly increased. The pulmonary vascularity is prominent centrally and there is mild cephalization. The cardiac silhouette is mildly enlarged. There is no pleural effusion. There is no alveolar infiltrate. The mediastinum is normal in width. There is tortuosity of the descending thoracic aorta. The observed portions of the bony thorax appear normal.  IMPRESSION: Findings are consistent with CHF superimposed upon COPD. There is no alveolar pneumonia.   Electronically Signed   By: David  Martinique   On: 01/14/2014 16:43    TELE NSR 80   ASSESSMENT AND PLAN Her obesity is true impediment to volume assessment, but her clinical progress seems to plead for a pulmonary rather than a cardiac cause of her acute illness. Agree with stopping Actos. But I don't think we should push diuresis further. If she continues to improve clinically, I would leave on home diuretic dose. If she deteriorates, may need a right heart catheterization to clarify the relative role of CHF and COPD in her illness.   Sanda Klein, MD, [facility] [phone] office [phone] pager 01/16/2014 11:37 AM

## 2014-01-17 DIAGNOSIS — I5033 Acute on chronic diastolic (congestive) heart failure: Secondary | ICD-10-CM | POA: Diagnosis not present

## 2014-01-17 DIAGNOSIS — I509 Heart failure, unspecified: Secondary | ICD-10-CM | POA: Diagnosis not present

## 2014-01-17 DIAGNOSIS — J449 Chronic obstructive pulmonary disease, unspecified: Secondary | ICD-10-CM | POA: Diagnosis not present

## 2014-01-17 DIAGNOSIS — I4891 Unspecified atrial fibrillation: Secondary | ICD-10-CM | POA: Diagnosis not present

## 2014-01-17 DIAGNOSIS — J9600 Acute respiratory failure, unspecified whether with hypoxia or hypercapnia: Secondary | ICD-10-CM | POA: Diagnosis not present

## 2014-01-17 DIAGNOSIS — Z7901 Long term (current) use of anticoagulants: Secondary | ICD-10-CM

## 2014-01-17 LAB — GLUCOSE, CAPILLARY
GLUCOSE-CAPILLARY: 163 mg/dL — AB (ref 70–99)
GLUCOSE-CAPILLARY: 260 mg/dL — AB (ref 70–99)
Glucose-Capillary: 302 mg/dL — ABNORMAL HIGH (ref 70–99)
Glucose-Capillary: 302 mg/dL — ABNORMAL HIGH (ref 70–99)

## 2014-01-17 MED ORDER — INSULIN ASPART 100 UNIT/ML ~~LOC~~ SOLN
0.0000 [IU] | Freq: Three times a day (TID) | SUBCUTANEOUS | Status: DC
  Administered 2014-01-17: 11 [IU] via SUBCUTANEOUS
  Administered 2014-01-17: 3 [IU] via SUBCUTANEOUS
  Administered 2014-01-17 – 2014-01-18 (×2): 11 [IU] via SUBCUTANEOUS
  Administered 2014-01-18 (×2): 8 [IU] via SUBCUTANEOUS
  Administered 2014-01-19: 11 [IU] via SUBCUTANEOUS

## 2014-01-17 MED ORDER — INSULIN GLARGINE 100 UNIT/ML ~~LOC~~ SOLN
5.0000 [IU] | Freq: Every day | SUBCUTANEOUS | Status: DC
  Administered 2014-01-17: 5 [IU] via SUBCUTANEOUS
  Filled 2014-01-17 (×2): qty 0.05

## 2014-01-17 MED ORDER — INSULIN ASPART 100 UNIT/ML ~~LOC~~ SOLN: 0.0000 [IU] | SUBCUTANEOUS | Status: DC

## 2014-01-17 MED ORDER — METHYLPREDNISOLONE SODIUM SUCC 40 MG IJ SOLR
40.0000 mg | Freq: Two times a day (BID) | INTRAMUSCULAR | Status: DC
  Administered 2014-01-17 – 2014-01-19 (×4): 40 mg via INTRAVENOUS
  Filled 2014-01-17 (×6): qty 1

## 2014-01-17 NOTE — Progress Notes (Signed)
    Subjective:  Overall she's breathing better. Improved. Reviewed prior notes. She feels as though her left lower extremity is increase in swelling.  Objective:  Vital Signs in the last 24 hours: Temp:  [97.7 F (36.5 C)-98.9 F (37.2 C)] 97.7 F (36.5 C) (02/21 0532) Pulse Rate:  [75-86] 86 (02/21 0532) Resp:  [18-20] 18 (02/21 0532) BP: (97-122)/(45-56) 121/47 mmHg (02/21 1014) SpO2:  [90 %-100 %] 100 % (02/21 0922) Weight:  [301 lb 2.4 oz (136.6 kg)] 301 lb 2.4 oz (136.6 kg) (02/21 0538)  Intake/Output from previous day: 02/20 0701 - 02/21 0700 In: -  Out: 700 [Urine:700]   Physical Exam: General: Well developed, well nourished, in no acute distress. Head:  Normocephalic and atraumatic. Lungs: Clear to auscultation and percussion. Heart: Normal S1 and S2.  No murmur, rubs or gallops.  Abdomen: soft, non-tender, positive bowel sounds. Extremities: Chronic lower extremity edema, 3+. Neurologic: Alert and oriented x 3.    Lab Results:  Recent Labs  01/14/14 1812 01/15/14 0505  WBC 15.8* 10.8*  HGB 9.6* 9.4*  PLT 295 275    Recent Labs  01/15/14 0505 01/16/14 0347  NA 137 136*  K 5.1 5.0  CL 99 96  CO2 23 23  GLUCOSE 326* 300*  BUN 31* 45*  CREATININE 1.34* 1.65*    Telemetry: No adverse arrhythmias Personally viewed.   Assessment/Plan:  Active Problems:   HYPERLIPIDEMIA-MIXED   COPD (chronic obstructive pulmonary disease) with emphysema   PAF (paroxysmal atrial fibrillation)   Acute respiratory failure   Acute on chronic diastolic congestive heart failure  68 year old with morbid obesity, COPD exacerbation, acute diastolic heart failure, diabetes, chronic edema.  1. Acute diastolic heart failure-I do believe that this is playing a role in her symptoms however her shortness of breath is certainly multifactorial including obesity, COPD. She is feeling better. Agree with by mouth diuresis currently. At this point, I would not pursue right heart  catheterization. When I counseled her on decreasing overall by mouth intake/liquids, she said that this would be a problem because of her chronic cough.  2. Chronic edema of lower extremity - I examined both of her legs. I do not see much of a difference between left and right. They were both severely edematous once again multifactorial from chronic venous insufficiency, morbid obesity. Continue with current Lasix.  3. Acute kidney injury-creatinine last checked 1.65 from 1.34. I'm fine with by mouth Lasix. As long as creatinine does not increase significantly, continue with angiotensin receptor blocker.  4. Diabetes-per primary team.  5. Paroxysmal atrial fibrillation-continue with dabigatran.  Paige Hardin, [place] 01/17/2014, 11:03 AM

## 2014-01-17 NOTE — Clinical Social Work Psychosocial (Signed)
Clinical Social Work Department BRIEF PSYCHOSOCIAL ASSESSMENT 01/17/2014  Patient:  Paige Hardin, Paige Hardin     Account Number:  [id number]     Admit date:  01/14/2014  Clinical Social Worker:  Wylene Men  Date/Time:  01/17/2014 03:08 PM  Referred by:  Physician  Date Referred:  01/17/2014 Referred for  SNF Placement   Other Referral:   CSW recommends psych consult RN aware   Interview type:  Patient Other interview type:   none    PSYCHOSOCIAL DATA Living Status:  ALONE Admitted from facility:   Level of care:   Primary support name:  none Primary support relationship to patient:   Degree of support available:   pt reports no supports - no church family, no family members etc.  Only son died suddenly with heart failureless than a year ago at [place]   Other Concerns:   none    SOCIAL WORK ASSESSMENT / PLAN CSW assessed pt at bedside.  Pt was alert and oriented x4. During assessment, pt was sitting in the bedside chair.  Pt was very tearful throughout the assessment and breathing was labored.  Pt states that she "did not care where" she went.  Pt did not show hope regarding her prognosis or about her future.    CSW inquired whether the pt felt as if she could contract for safety and the pt insisted that she could and did not have thoughts of hurting herself, but did not see a reason to live.  Her only son died less than a year ago at [facility] after suffering sudden heart failure.  pt is devistated.  Pt states that she has nobody.  CSW asked pt if she had talked to anyone regarding his passing.  The pt reports not speaking to anyone and asks "what good would it do, it wouldn't bring him back would it?"    CSW spoke to RN regarding need for possible psych consult prior to dc if MD would deem it appropriate.    Pt is agreeable to SNF search and reports that she lives in [place] and  [place] would be her first choice.   Assessment/plan status:  Psychosocial Support/Ongoing Assessment of Needs Other assessment/ plan:   SNF  psych consult   Information/referral to community resources:   SNF    PATIENT'S/FAMILY'S RESPONSE TO PLAN OF CARE: Pt is agreeable to SNF search and states that her first choice is [facility] in [place].       Nonnie Done, [place] [phone]  Clinical Social Work

## 2014-01-17 NOTE — Progress Notes (Addendum)
[facility] PROGRESS NOTE  Paige Hardin UEA:[id number] DOB: [birth year] DOA: 01/14/2014 PCP: Stephens Shire, MD  Assessment/Plan:  Active Problems:  Acute respiratory failure  HYPERLIPIDEMIA-MIXED  DIASTOLIC HEART FAILURE, CHRONIC  COPD (chronic obstructive pulmonary disease) with emphysema  ATRIAL FIBRILLATION, HX OF   68 y/o female with PMH of HTN, HPL, COPD, CKD, DM, CHF AFIb on pradaxa presented with progressive SOB, admitted with CHF, COPD exacerbation   1. COPD exacerbation;  -improving; decrease  IV steroids, cont bronchodilators, oxygen, IV atx;    2. CHF exacerbation likely diastolic HF; echo: LVEF 47%, diastolic HF -improving; per cardiology; IV diuretics 60 BI changed to PO 2/20 to lasix 40 mg daily; weight, I/O; appreciate cardiology input; d/c pioglitozone   3. Acute respiratory failure: as above, desat study prior to discharge   4. A. fib with RVR: cont Cardizem   5. Hyperlipidemia: Continue with Vytorin  6. DM HA1C-6.1 -glucose uncontrolled with steroids; started lantus 5 U+ISS for now; d/c pioglitozone with CHF;   Code Status: DNR Family Communication: d/w patient, updated Granja,Johnny Spouse [phone]  (indicate person spoken with, relationship, and if by phone, the number) Disposition Plan: SNF when clinically better 1-2 days    Consultants:  Cardiology   Procedures:  None   Antibiotics:  levofloxacin 2/19<<<<(indicate start date, and stop date if known)  HPI/Subjective: alert  Objective: Filed Vitals:   01/17/14 0532  BP: 122/56  Pulse: 86  Temp: 97.7 F (36.5 C)  Resp: 18    Intake/Output Summary (Last 24 hours) at 01/17/14 0818 Last data filed at 01/17/14 0539  Gross per 24 hour  Intake      0 ml  Output    700 ml  Net   -700 ml   Filed Weights   01/14/14 2118 01/17/14 0538  Weight: 137.2 kg (302 lb 7.5 oz) 136.6 kg (301 lb 2.4 oz)    Exam:   General:  alert  Cardiovascular: s1,s2 rrr  Respiratory: few  wheezing in LL  Abdomen: soft, nt, nd   Musculoskeletal: LE edema   Data Reviewed: Basic Metabolic Panel:  Recent Labs Lab 01/14/14 1705 01/15/14 0505 01/16/14 0347  NA 140 137 136*  K 4.5 5.1 5.0  CL 101 99 96  CO2 24 23 23   GLUCOSE 143* 326* 300*  BUN 30* 31* 45*  CREATININE 1.56* 1.34* 1.65*  CALCIUM 9.7 9.2 8.9   Liver Function Tests: No results found for this basename: AST, ALT, ALKPHOS, BILITOT, PROT, ALBUMIN,  in the last 168 hours No results found for this basename: LIPASE, AMYLASE,  in the last 168 hours No results found for this basename: AMMONIA,  in the last 168 hours CBC:  Recent Labs Lab 01/14/14 1812 01/15/14 0505  WBC 15.8* 10.8*  NEUTROABS 14.2*  --   HGB 9.6* 9.4*  HCT 29.8* 30.0*  MCV 93.7 94.0  PLT 295 275   Cardiac Enzymes: No results found for this basename: CKTOTAL, CKMB, CKMBINDEX, TROPONINI,  in the last 168 hours BNP (last 3 results)  Recent Labs  01/14/14 1705  PROBNP 4957.0*   CBG:  Recent Labs Lab 01/16/14 0801 01/16/14 1142 01/16/14 1650 01/16/14 2036 01/17/14 0808  GLUCAP 287* 322* 139* 229* 302*    Recent Results (from the past 240 hour(s))  RESPIRATORY VIRUS PANEL     Status: None   Collection Time    01/15/14  1:14 PM      Result Value Ref Range Status   Source - RVPAN NASAL SWAB  Corrected   Comment: CORRECTED ON 02/20 AT 2229: PREVIOUSLY REPORTED AS NASAL SWAB   Respiratory Syncytial Virus A NOT DETECTED   Final   Respiratory Syncytial Virus B NOT DETECTED   Final   Influenza A NOT DETECTED   Final   Influenza B NOT DETECTED   Final   Parainfluenza 1 NOT DETECTED   Final   Parainfluenza 2 NOT DETECTED   Final   Parainfluenza 3 NOT DETECTED   Final   Metapneumovirus NOT DETECTED   Final   Rhinovirus NOT DETECTED   Final   Adenovirus NOT DETECTED   Final   Influenza A H1 NOT DETECTED   Final   Influenza A H3 NOT DETECTED   Final   Comment: (NOTE)           Normal Reference Range for each Analyte: NOT  DETECTED     Testing performed using the Luminex xTAG Respiratory Viral Panel test     kit.     This test was developed and its performance characteristics determined     by [facility]. It has not been cleared or approved by the [place]     Food and Drug Administration. This test is used for clinical purposes.     It should not be regarded as investigational or for research. This     laboratory is certified under the [place] (CLIA) as qualified to perform high complexity     clinical laboratory testing.     Performed at [facility]     Studies: No results found.  Scheduled Meds: . acamprosate  666 mg Oral TID WC  . ezetimibe  10 mg Oral Daily   And  . atorvastatin  20 mg Oral q1800  . budesonide-formoterol  2 puff Inhalation BID  . cholecalciferol  1,000 Units Oral Daily  . dabigatran  150 mg Oral Q12H  . diltiazem  120 mg Oral Q12H  . furosemide  40 mg Oral Daily  . insulin aspart  0-9 Units Subcutaneous TID WC  . ipratropium-albuterol  3 mL Nebulization BID  . irbesartan  75 mg Oral Daily  . levofloxacin (LEVAQUIN) IV  750 mg Intravenous Q24H  . methylPREDNISolone (SOLU-MEDROL) injection  40 mg Intravenous 3 times per day  . vitamin C  500 mg Oral Daily  . vitamin E  400 Units Oral Daily   Continuous Infusions:   Active Problems:   HYPERLIPIDEMIA-MIXED   COPD (chronic obstructive pulmonary disease) with emphysema   PAF (paroxysmal atrial fibrillation)   Acute respiratory failure   Acute on chronic diastolic congestive heart failure    Time spent: >35 minutes     Kinnie Feil  [facility] Pager [phone]. If 7PM-7AM, please contact night-coverage at [email or web address], password [facility] 01/17/2014, 8:18 AM  LOS: 3 days

## 2014-01-18 DIAGNOSIS — I509 Heart failure, unspecified: Secondary | ICD-10-CM | POA: Diagnosis not present

## 2014-01-18 DIAGNOSIS — I4891 Unspecified atrial fibrillation: Secondary | ICD-10-CM | POA: Diagnosis not present

## 2014-01-18 DIAGNOSIS — J449 Chronic obstructive pulmonary disease, unspecified: Secondary | ICD-10-CM | POA: Diagnosis not present

## 2014-01-18 DIAGNOSIS — J9600 Acute respiratory failure, unspecified whether with hypoxia or hypercapnia: Secondary | ICD-10-CM | POA: Diagnosis not present

## 2014-01-18 DIAGNOSIS — I5033 Acute on chronic diastolic (congestive) heart failure: Secondary | ICD-10-CM | POA: Diagnosis not present

## 2014-01-18 LAB — BASIC METABOLIC PANEL
BUN: 67 mg/dL — ABNORMAL HIGH (ref 6–23)
CHLORIDE: 97 meq/L (ref 96–112)
CO2: 23 mEq/L (ref 19–32)
Calcium: 8.3 mg/dL — ABNORMAL LOW (ref 8.4–10.5)
Creatinine, Ser: 1.59 mg/dL — ABNORMAL HIGH (ref 0.50–1.10)
GFR, EST AFRICAN AMERICAN: 37 mL/min — AB (ref >90)
GFR, EST NON AFRICAN AMERICAN: 32 mL/min — AB (ref >90)
Glucose, Bld: 269 mg/dL — ABNORMAL HIGH (ref 70–99)
Potassium: 4.6 mEq/L (ref 3.7–5.3)
SODIUM: 135 meq/L — AB (ref 137–147)

## 2014-01-18 LAB — GLUCOSE, CAPILLARY
GLUCOSE-CAPILLARY: 303 mg/dL — AB (ref 70–99)
Glucose-Capillary: 252 mg/dL — ABNORMAL HIGH (ref 70–99)
Glucose-Capillary: 257 mg/dL — ABNORMAL HIGH (ref 70–99)
Glucose-Capillary: 221 mg/dL — ABNORMAL HIGH (ref 70–99)

## 2014-01-18 MED ORDER — INSULIN GLARGINE 100 UNIT/ML ~~LOC~~ SOLN
10.0000 [IU] | Freq: Every day | SUBCUTANEOUS | Status: DC
  Administered 2014-01-18: 10 [IU] via SUBCUTANEOUS
  Filled 2014-01-18 (×2): qty 0.1

## 2014-01-18 MED ORDER — ONDANSETRON HCL 4 MG/2ML IJ SOLN
4.0000 mg | Freq: Three times a day (TID) | INTRAMUSCULAR | Status: DC | PRN
  Administered 2014-01-18: 4 mg via INTRAVENOUS
  Filled 2014-01-18: qty 2

## 2014-01-18 MED ORDER — SENNA 8.6 MG PO TABS
1.0000 | ORAL_TABLET | Freq: Every day | ORAL | Status: DC
  Administered 2014-01-18 – 2014-01-23 (×6): 8.6 mg via ORAL
  Filled 2014-01-18 (×6): qty 1

## 2014-01-18 MED ORDER — DOCUSATE SODIUM 100 MG PO CAPS
100.0000 mg | ORAL_CAPSULE | Freq: Two times a day (BID) | ORAL | Status: DC
  Administered 2014-01-18 – 2014-01-23 (×10): 100 mg via ORAL
  Filled 2014-01-18 (×13): qty 1

## 2014-01-18 MED ORDER — POLYETHYLENE GLYCOL 3350 17 G PO PACK
17.0000 g | PACK | Freq: Every day | ORAL | Status: DC
  Administered 2014-01-19 – 2014-01-23 (×5): 17 g via ORAL
  Filled 2014-01-18 (×6): qty 1

## 2014-01-18 NOTE — Progress Notes (Addendum)
[facility] PROGRESS NOTE  Paige Hardin CWU:[id number] DOB: 1946-05-24 DOA: 01/14/2014 PCP: Stephens Shire, MD  Assessment/Plan:  Active Problems:  Acute respiratory failure  HYPERLIPIDEMIA-MIXED  DIASTOLIC HEART FAILURE, CHRONIC  COPD (chronic obstructive pulmonary disease) with emphysema  ATRIAL FIBRILLATION, HX OF   68 y/o female with PMH of HTN, HPL, COPD, CKD, DM, CHF AFIb on pradaxa presented with progressive SOB, admitted with CHF, COPD exacerbation   1. COPD exacerbation;  -improving; decrease  IV steroids, cont bronchodilators, oxygen, IV atx;   -change to PO steroids in AM plan to d/c ?2/23  2. CHF exacerbation likely diastolic HF; echo: LVEF 38%, diastolic HF -improving; per cardiology; IV diuretics 60 BI changed to PO 2/20 to lasix 40 mg daily; weight, I/O; appreciate cardiology input; d/c pioglitozone   3. Acute respiratory failure: as above, desat study prior to discharge   4. A. fib with RVR: cont Cardizem, dabigatran    5. Hyperlipidemia: Continue with Vytorin  6. DM HA1C-6.1 -glucose uncontrolled with steroids; started lantus 10 U+ISS for now; d/c pioglitozone with CHF;   Code Status: DNR Family Communication: d/w patient, updated Meixner,Paige Hardin [phone]  (indicate person spoken with, relationship, and if by phone, the number) Disposition Plan: SNF when clinically better 1-2 days    Consultants:  Cardiology   Procedures:  None   Antibiotics:  levofloxacin 2/19<<<<(indicate start date, and stop date if known)  HPI/Subjective: alert  Objective: Filed Vitals:   01/18/14 0520  BP: 118/54  Pulse: 82  Temp: 98.5 F (36.9 C)  Resp: 20    Intake/Output Summary (Last 24 hours) at 01/18/14 0835 Last data filed at 01/17/14 2300  Gross per 24 hour  Intake    840 ml  Output    400 ml  Net    440 ml   Filed Weights   01/14/14 2118 01/17/14 0538 01/18/14 0520  Weight: 137.2 kg (302 lb 7.5 oz) 136.6 kg (301 lb 2.4 oz) 136.3 kg (300  lb 7.8 oz)    Exam:   General:  alert  Cardiovascular: s1,s2 rrr  Respiratory: few wheezing in LL  Abdomen: soft, nt, nd   Musculoskeletal: LE edema   Data Reviewed: Basic Metabolic Panel:  Recent Labs Lab 01/14/14 1705 01/15/14 0505 01/16/14 0347  NA 140 137 136*  K 4.5 5.1 5.0  CL 101 99 96  CO2 24 23 23   GLUCOSE 143* 326* 300*  BUN 30* 31* 45*  CREATININE 1.56* 1.34* 1.65*  CALCIUM 9.7 9.2 8.9   Liver Function Tests: No results found for this basename: AST, ALT, ALKPHOS, BILITOT, PROT, ALBUMIN,  in the last 168 hours No results found for this basename: LIPASE, AMYLASE,  in the last 168 hours No results found for this basename: AMMONIA,  in the last 168 hours CBC:  Recent Labs Lab 01/14/14 1812 01/15/14 0505  WBC 15.8* 10.8*  NEUTROABS 14.2*  --   HGB 9.6* 9.4*  HCT 29.8* 30.0*  MCV 93.7 94.0  PLT 295 275   Cardiac Enzymes: No results found for this basename: CKTOTAL, CKMB, CKMBINDEX, TROPONINI,  in the last 168 hours BNP (last 3 results)  Recent Labs  01/14/14 1705  PROBNP 4957.0*   CBG:  Recent Labs Lab 01/17/14 0808 01/17/14 1148 01/17/14 1641 01/17/14 2113 01/18/14 0736  GLUCAP 302* 302* 163* 260* 303*    Recent Results (from the past 240 hour(s))  RESPIRATORY VIRUS PANEL     Status: None   Collection Time    01/15/14  1:14 PM      Result Value Ref Range Status   Source - RVPAN NASAL SWAB   Corrected   Comment: CORRECTED ON 02/20 AT 2229: PREVIOUSLY REPORTED AS NASAL SWAB   Respiratory Syncytial Virus A NOT DETECTED   Final   Respiratory Syncytial Virus B NOT DETECTED   Final   Influenza A NOT DETECTED   Final   Influenza B NOT DETECTED   Final   Parainfluenza 1 NOT DETECTED   Final   Parainfluenza 2 NOT DETECTED   Final   Parainfluenza 3 NOT DETECTED   Final   Metapneumovirus NOT DETECTED   Final   Rhinovirus NOT DETECTED   Final   Adenovirus NOT DETECTED   Final   Influenza A H1 NOT DETECTED   Final   Influenza A H3 NOT  DETECTED   Final   Comment: (NOTE)           Normal Reference Range for each Analyte: NOT DETECTED     Testing performed using the Luminex xTAG Respiratory Viral Panel test     kit.     This test was developed and its performance characteristics determined     by [facility]. It has not been cleared or approved by the [place]     Food and Drug Administration. This test is used for clinical purposes.     It should not be regarded as investigational or for research. This     laboratory is certified under the [place] (CLIA) as qualified to perform high complexity     clinical laboratory testing.     Performed at [facility]     Studies: No results found.  Scheduled Meds: . ezetimibe  10 mg Oral Daily   And  . atorvastatin  20 mg Oral q1800  . budesonide-formoterol  2 puff Inhalation BID  . cholecalciferol  1,000 Units Oral Daily  . dabigatran  150 mg Oral Q12H  . diltiazem  120 mg Oral Q12H  . furosemide  40 mg Oral Daily  . insulin aspart  0-15 Units Subcutaneous TID WC  . insulin glargine  5 Units Subcutaneous QHS  . ipratropium-albuterol  3 mL Nebulization BID  . irbesartan  75 mg Oral Daily  . levofloxacin (LEVAQUIN) IV  750 mg Intravenous Q24H  . methylPREDNISolone (SOLU-MEDROL) injection  40 mg Intravenous Q12H  . vitamin C  500 mg Oral Daily  . vitamin E  400 Units Oral Daily   Continuous Infusions:   Active Problems:   HYPERLIPIDEMIA-MIXED   COPD (chronic obstructive pulmonary disease) with emphysema   PAF (paroxysmal atrial fibrillation)   Acute respiratory failure   Acute on chronic diastolic congestive heart failure    Time spent: >35 minutes     Kinnie Feil  [facility] Pager [phone]. If 7PM-7AM, please contact night-coverage at [email or web address], password [facility] 01/18/2014, 8:35 AM  LOS: 4 days

## 2014-01-18 NOTE — Progress Notes (Signed)
Patient Name: Paige Hardin Date of Encounter: 01/18/2014     Active Problems:   HYPERLIPIDEMIA-MIXED   COPD (chronic obstructive pulmonary disease) with emphysema   PAF (paroxysmal atrial fibrillation)   Acute respiratory failure   Acute on chronic diastolic congestive heart failure  SUBJECTIVE: Breathing improved today. Persistent leg swelling.   OBJECTIVE  Filed Vitals:   01/17/14 2050 01/17/14 2058 01/17/14 2100 01/18/14 0520  BP:   118/52 118/54  Pulse:   77 82  Temp:   98.7 F (37.1 C) 98.5 F (36.9 C)  TempSrc:  Oral Oral Oral  Resp:   22 20  Height:      Weight:    136.3 kg (300 lb 7.8 oz)  SpO2: 93%   96%    Intake/Output Summary (Last 24 hours) at 01/18/14 2297 Last data filed at 01/17/14 2300  Gross per 24 hour  Intake    600 ml  Output    400 ml  Net    200 ml   Weight change: -0.3 kg (-10.6 oz)  PHYSICAL EXAM  General: Morbidly obese appearing female, in no acute distress. Head: Normocephalic, atraumatic, sclera non-icteric, no xanthomas, nares are without discharge.  Neck: Supple without bruits, + JVP to earlobes Lungs:  Resp regular, mildly labored, exp wheezing appreciates, fine bibasilar rales Heart: RRR no s3, s4, or murmurs. Abdomen: Soft, non-tender, non-distended, BS + x 4.  Msk:  Strength and tone appears normal for age. Extremities: 1+ bilateral pretibial edema. No clubbing or cyanosis. DP/PT/Radials 2+ and equal bilaterally. Neuro: Alert and oriented X 3. Moves all extremities spontaneously. Psych: Normal affect.  LABS:  Recent Labs Lab 01/14/14 1705 01/15/14 0505 01/16/14 0347  NA 140 137 136*  K 4.5 5.1 5.0  CL 101 99 96  CO2 24 23 23   BUN 30* 31* 45*  CREATININE 1.56* 1.34* 1.65*  CALCIUM 9.7 9.2 8.9  GLUCOSE 143* 326* 300*   Recent Labs     01/15/14  1140  HGBA1C  6.1*   TELE: NSR, intermittent PACs  Radiology/Studies:  Dg Chest Portable 1 View  01/14/2014   CLINICAL DATA:  Worsening dyspnea, history of  emphysema and CHF  EXAM: PORTABLE CHEST - 1 VIEW  COMPARISON:  CT CHEST W/O CM dated 09/09/2010  FINDINGS: The lungs are well-expanded. The interstitial markings are mildly increased. The pulmonary vascularity is prominent centrally and there is mild cephalization. The cardiac silhouette is mildly enlarged. There is no pleural effusion. There is no alveolar infiltrate. The mediastinum is normal in width. There is tortuosity of the descending thoracic aorta. The observed portions of the bony thorax appear normal.  IMPRESSION: Findings are consistent with CHF superimposed upon COPD. There is no alveolar pneumonia.   Electronically Signed   By: David  [place]   On: 01/14/2014 16:43    Current Medications:  . ezetimibe  10 mg Oral Daily   And  . atorvastatin  20 mg Oral q1800  . budesonide-formoterol  2 puff Inhalation BID  . cholecalciferol  1,000 Units Oral Daily  . dabigatran  150 mg Oral Q12H  . diltiazem  120 mg Oral Q12H  . furosemide  40 mg Oral Daily  . insulin aspart  0-15 Units Subcutaneous TID WC  . insulin glargine  10 Units Subcutaneous QHS  . ipratropium-albuterol  3 mL Nebulization BID  . irbesartan  75 mg Oral Daily  . levofloxacin (LEVAQUIN) IV  750 mg Intravenous Q24H  . methylPREDNISolone (SOLU-MEDROL) injection  40 mg  Intravenous Q12H  . vitamin C  500 mg Oral Daily  . vitamin E  400 Units Oral Daily    ASSESSMENT AND PLAN:  1. Acute respiratory distress Multifactorial d/t A/COPD, A/C diastolic CHF on a background of morbid obesity +/- OHS/OSA. Breathing improving w/ steroids, nebs, abx and diuresis.   2. Acute on chronic diastolic CHF- weight continues to trend down (300 lbs today) despite mildly + I/Os. Breathing improved. Exacerbated by COPD decompensation, excessive salt/fluid intake. LE edema multifactorial due to CVI, obesity as well. JVD and bibasilar rales persist on exam. Still has some ways to go. Actos held. Lasix switched to PO yesterday.  -- Check daily BMETs  to follow renal function and electrolytes -- Continue Lasix, advised to take on an empty stomach at home.  -- Needs CHF education- recommended Na/fluid restriction, daily weight monitoring, taking extra Lasix PRN for weight gain, compression stockings/leg elevation -- Avoid hypoxia  3. PAF- maintaining NSR w/ intermittent PACs. She is on Cardizem and Pradaxa. She reports taking Pradaxa only once a day at home due to bruising. Discussed importance of BID dosing for full anticoagulation effect, risk reduction of cardioembolism. She understood and agreed.  -- Continue Cardizem (aviod BBs w/ COPD) -- Continue Pradaxa at BID dosing   4. Hyperlipidemia- switched from Zocor to Lipitor due to risk of myopathy w/ concomitant Cardizem use.   5. Hypertension- Normotensive. Continued on ARB, underlying DM2.  -- Monitor K and renal function w/ this  6. DM2 w/ diabetic nephropathy- Cr uptrending to 1.6 yesterday. No BMET yet as of this AM. Actos discontinued w/ CHF.  -- Glycemic control w/ primary team -- Serial BMETs to monitor renal function  7. Acute on chronic COPD- improving, steroids tapering. Still wheezing on exam.  -- Management per primary team.    Signed, R. Valeria Batman, PA-C 01/18/2014, 9:04 AM  Personally seen and examined. Agree with above.   Candee Furbish, MD

## 2014-01-18 NOTE — Clinical Social Work Placement (Addendum)
Clinical Social Work Department CLINICAL SOCIAL WORK PLACEMENT NOTE 01/18/2014  Patient:  Paige Hardin, Paige Hardin  Account Number:  [id number] Admit date:  01/14/2014  Clinical Social Worker:  Kemper Durie, [place]  Date/time:  01/18/2014 11:00 AM  Clinical Social Work is seeking post-discharge placement for this patient at the following level of care:   [place]   (*CSW will update this form in Epic as items are completed)   01/17/2014  Patient/family provided with [place] Department of Clinical Social Work's list of facilities offering this level of care within the geographic area requested by the patient (or if unable, by the patient's family).  01/17/2014  Patient/family informed of their freedom to choose among providers that offer the needed level of care, that participate in Medicare, Medicaid or managed care program needed by the patient, have an available bed and are willing to accept the patient.  01/17/2014  Patient/family informed of [facility]' ownership interest in [facility], as well as of the fact that they are under no obligation to receive care at this facility.  PASARR submitted to EDS on 01/18/2014 PASARR number received from EDS on 01/18/2014  FL2 transmitted to all facilities in geographic area requested by pt/family on  01/18/2014 FL2 transmitted to all facilities within larger geographic area on   Patient informed that his/her managed care company has contracts with or will negotiate with  certain facilities, including the following:     Patient/family informed of bed offers received:  01/19/2014 Patient chooses bed at [facility] Physician recommends and patient chooses bed at    Patient to be transferred to  [facility]  01/23/14 Patient to be transferred to facility by [facility]  The following physician request were entered in Epic:   Additional Comments:   Liz Beach, Richrd Sox, [phone]

## 2014-01-19 DIAGNOSIS — J9600 Acute respiratory failure, unspecified whether with hypoxia or hypercapnia: Secondary | ICD-10-CM | POA: Diagnosis not present

## 2014-01-19 DIAGNOSIS — I4891 Unspecified atrial fibrillation: Secondary | ICD-10-CM | POA: Diagnosis not present

## 2014-01-19 DIAGNOSIS — I509 Heart failure, unspecified: Secondary | ICD-10-CM | POA: Diagnosis not present

## 2014-01-19 DIAGNOSIS — J449 Chronic obstructive pulmonary disease, unspecified: Secondary | ICD-10-CM | POA: Diagnosis not present

## 2014-01-19 DIAGNOSIS — I5033 Acute on chronic diastolic (congestive) heart failure: Secondary | ICD-10-CM | POA: Diagnosis not present

## 2014-01-19 LAB — BASIC METABOLIC PANEL
BUN: 70 mg/dL — AB (ref 6–23)
CALCIUM: 7.4 mg/dL — AB (ref 8.4–10.5)
CHLORIDE: 99 meq/L (ref 96–112)
CO2: 18 mEq/L — ABNORMAL LOW (ref 19–32)
CREATININE: 1.5 mg/dL — AB (ref 0.50–1.10)
GFR calc non Af Amer: 35 mL/min — ABNORMAL LOW (ref >90)
GFR, EST AFRICAN AMERICAN: 40 mL/min — AB (ref >90)
Glucose, Bld: 300 mg/dL — ABNORMAL HIGH (ref 70–99)
Potassium: 6.8 mEq/L (ref 3.7–5.3)
Sodium: 133 mEq/L — ABNORMAL LOW (ref 137–147)

## 2014-01-19 LAB — GLUCOSE, CAPILLARY
GLUCOSE-CAPILLARY: 271 mg/dL — AB (ref 70–99)
GLUCOSE-CAPILLARY: 145 mg/dL — AB (ref 70–99)
Glucose-Capillary: 303 mg/dL — ABNORMAL HIGH (ref 70–99)
Glucose-Capillary: 172 mg/dL — ABNORMAL HIGH (ref 70–99)

## 2014-01-19 LAB — PRO B NATRIURETIC PEPTIDE: Pro B Natriuretic peptide (BNP): 2192 pg/mL — ABNORMAL HIGH (ref 0–125)

## 2014-01-19 LAB — POTASSIUM: Potassium: 5 mEq/L (ref 3.7–5.3)

## 2014-01-19 MED ORDER — INSULIN ASPART 100 UNIT/ML ~~LOC~~ SOLN
0.0000 [IU] | Freq: Three times a day (TID) | SUBCUTANEOUS | Status: DC
  Administered 2014-01-19: 3 [IU] via SUBCUTANEOUS
  Administered 2014-01-19: 11 [IU] via SUBCUTANEOUS
  Administered 2014-01-20 (×3): 4 [IU] via SUBCUTANEOUS
  Administered 2014-01-21: 7 [IU] via SUBCUTANEOUS
  Administered 2014-01-21 – 2014-01-22 (×2): 4 [IU] via SUBCUTANEOUS
  Administered 2014-01-22: 7 [IU] via SUBCUTANEOUS

## 2014-01-19 MED ORDER — INSULIN GLARGINE 100 UNIT/ML ~~LOC~~ SOLN
10.0000 [IU] | Freq: Once | SUBCUTANEOUS | Status: AC
  Administered 2014-01-19: 10 [IU] via SUBCUTANEOUS
  Filled 2014-01-19: qty 0.1

## 2014-01-19 MED ORDER — MOMETASONE FURO-FORMOTEROL FUM 100-5 MCG/ACT IN AERO: 2.0000 | INHALATION_SPRAY | Freq: Two times a day (BID) | RESPIRATORY_TRACT | Status: DC

## 2014-01-19 MED ORDER — INSULIN GLARGINE 100 UNIT/ML ~~LOC~~ SOLN
15.0000 [IU] | Freq: Every day | SUBCUTANEOUS | Status: DC
  Administered 2014-01-19 – 2014-01-22 (×4): 15 [IU] via SUBCUTANEOUS
  Filled 2014-01-19 (×5): qty 0.15

## 2014-01-19 MED ORDER — PANTOPRAZOLE SODIUM 40 MG PO TBEC
40.0000 mg | DELAYED_RELEASE_TABLET | Freq: Every day | ORAL | Status: DC
  Administered 2014-01-19 – 2014-01-23 (×5): 40 mg via ORAL
  Filled 2014-01-19 (×5): qty 1

## 2014-01-19 MED ORDER — FUROSEMIDE 10 MG/ML IJ SOLN
80.0000 mg | Freq: Three times a day (TID) | INTRAMUSCULAR | Status: DC
  Administered 2014-01-19: 80 mg via INTRAVENOUS
  Filled 2014-01-19 (×5): qty 8

## 2014-01-19 MED ORDER — LEVOFLOXACIN 500 MG PO TABS
500.0000 mg | ORAL_TABLET | Freq: Every day | ORAL | Status: DC
  Administered 2014-01-19 – 2014-01-20 (×2): 500 mg via ORAL
  Filled 2014-01-19 (×2): qty 1

## 2014-01-19 MED ORDER — PREDNISONE 20 MG PO TABS
30.0000 mg | ORAL_TABLET | Freq: Every day | ORAL | Status: DC
  Administered 2014-01-19 – 2014-01-21 (×3): 30 mg via ORAL
  Filled 2014-01-19 (×4): qty 1

## 2014-01-19 NOTE — Progress Notes (Signed)
[facility] PROGRESS NOTE  MERI PELOT ZRA:[id number] DOB: [birth year] DOA: 01/14/2014 PCP: Stephens Shire, MD  Assessment/Plan:  Active Problems:  Acute respiratory failure  HYPERLIPIDEMIA-MIXED  DIASTOLIC HEART FAILURE, CHRONIC  COPD (chronic obstructive pulmonary disease) with emphysema  ATRIAL FIBRILLATION, HX OF   68 y/o female with PMH of HTN, HPL, COPD, CKD, DM, CHF AFIb on pradaxa presented with progressive SOB, admitted with CHF, COPD exacerbation   1. COPD exacerbation;  -improving; d/c IV steroids change to PO, cont bronchodilators, oxygen, IV atx;    2. CHF exacerbation likely diastolic HF; echo: LVEF 54%, diastolic HF; LE edema likely CHF+venous stasis  -per cardiology; IV diuretics 60 BID changed to PO 2/20 to lasix 40 mg daily; weight, I/O; appreciate cardiology input; d/c pioglitozone  -Hyper K likely hemolyzed, repeat K   3. Acute respiratory failure: as above, desat study prior to discharge   4. A. fib with RVR: cont Cardizem, dabigatran    5. Hyperlipidemia: Continue with Vytorin  6. DM HA1C-6.1 -glucose uncontrolled with steroids; started lantus 10 U+ISS for now; increase lantus + ISS;  -d/c pioglitozone with CHF;   Plan for d/c SNF on 2/24 if stable   Code Status: DNR Family Communication: d/w patient, updated Werst,Johnny Spouse [phone]  (indicate person spoken with, relationship, and if by phone, the number) Disposition Plan: SNF when clinically better 1-2 days    Consultants:  Cardiology   Procedures:  None   Antibiotics:  levofloxacin 2/19<<<<(indicate start date, and stop date if known)  HPI/Subjective: alert  Objective: Filed Vitals:   01/19/14 0500  BP: 103/41  Pulse: 78  Temp: 97.8 F (36.6 C)  Resp: 18    Intake/Output Summary (Last 24 hours) at 01/19/14 0848 Last data filed at 01/19/14 0800  Gross per 24 hour  Intake    600 ml  Output   2200 ml  Net  -1600 ml   Filed Weights   01/17/14 0538 01/18/14 0520  01/19/14 0500  Weight: 136.6 kg (301 lb 2.4 oz) 136.3 kg (300 lb 7.8 oz) 136.7 kg (301 lb 5.9 oz)    Exam:   General:  alert  Cardiovascular: s1,s2 rrr  Respiratory: few wheezing in LL  Abdomen: soft, nt, nd   Musculoskeletal: LE edema   Data Reviewed: Basic Metabolic Panel:  Recent Labs Lab 01/14/14 1705 01/15/14 0505 01/16/14 0347 01/18/14 1230 01/19/14 0525 01/19/14 0745  NA 140 137 136* 135* 133*  --   K 4.5 5.1 5.0 4.6 6.8* 5.0  CL 101 99 96 97 99  --   CO2 24 23 23 23  18*  --   GLUCOSE 143* 326* 300* 269* 300*  --   BUN 30* 31* 45* 67* 70*  --   CREATININE 1.56* 1.34* 1.65* 1.59* 1.50*  --   CALCIUM 9.7 9.2 8.9 8.3* 7.4*  --    Liver Function Tests: No results found for this basename: AST, ALT, ALKPHOS, BILITOT, PROT, ALBUMIN,  in the last 168 hours No results found for this basename: LIPASE, AMYLASE,  in the last 168 hours No results found for this basename: AMMONIA,  in the last 168 hours CBC:  Recent Labs Lab 01/14/14 1812 01/15/14 0505  WBC 15.8* 10.8*  NEUTROABS 14.2*  --   HGB 9.6* 9.4*  HCT 29.8* 30.0*  MCV 93.7 94.0  PLT 295 275   Cardiac Enzymes: No results found for this basename: CKTOTAL, CKMB, CKMBINDEX, TROPONINI,  in the last 168 hours BNP (last 3 results)  Recent  Labs  01/14/14 1705  PROBNP 4957.0*   CBG:  Recent Labs Lab 01/18/14 0736 01/18/14 1147 01/18/14 1628 01/18/14 2141 01/19/14 0755  GLUCAP 303* 252* 257* 221* 303*    Recent Results (from the past 240 hour(s))  RESPIRATORY VIRUS PANEL     Status: None   Collection Time    01/15/14  1:14 PM      Result Value Ref Range Status   Source - [facility] NASAL SWAB   Corrected   Comment: CORRECTED ON 02/20 AT 2229: PREVIOUSLY REPORTED AS NASAL SWAB   Respiratory Syncytial Virus A NOT DETECTED   Final   Respiratory Syncytial Virus B NOT DETECTED   Final   Influenza A NOT DETECTED   Final   Influenza B NOT DETECTED   Final   Parainfluenza 1 NOT DETECTED   Final    Parainfluenza 2 NOT DETECTED   Final   Parainfluenza 3 NOT DETECTED   Final   Metapneumovirus NOT DETECTED   Final   Rhinovirus NOT DETECTED   Final   Adenovirus NOT DETECTED   Final   Influenza A H1 NOT DETECTED   Final   Influenza A H3 NOT DETECTED   Final   Comment: (NOTE)           Normal Reference Range for each Analyte: NOT DETECTED     Testing performed using the Luminex xTAG Respiratory Viral Panel test     kit.     This test was developed and its performance characteristics determined     by [facility]. It has not been cleared or approved by the [place]     Food and Drug Administration. This test is used for clinical purposes.     It should not be regarded as investigational or for research. This     laboratory is certified under the [place] (CLIA) as qualified to perform high complexity     clinical laboratory testing.     Performed at [facility]     Studies: No results found.  Scheduled Meds: . ezetimibe  10 mg Oral Daily   And  . atorvastatin  20 mg Oral q1800  . budesonide-formoterol  2 puff Inhalation BID  . cholecalciferol  1,000 Units Oral Daily  . dabigatran  150 mg Oral Q12H  . diltiazem  120 mg Oral Q12H  . docusate sodium  100 mg Oral BID  . furosemide  40 mg Oral Daily  . insulin aspart  0-15 Units Subcutaneous TID WC  . insulin glargine  10 Units Subcutaneous QHS  . ipratropium-albuterol  3 mL Nebulization BID  . irbesartan  75 mg Oral Daily  . levofloxacin (LEVAQUIN) IV  750 mg Intravenous Q24H  . polyethylene glycol  17 g Oral Daily  . predniSONE  30 mg Oral Q breakfast  . senna  1 tablet Oral Daily  . vitamin C  500 mg Oral Daily  . vitamin E  400 Units Oral Daily   Continuous Infusions:   Active Problems:   HYPERLIPIDEMIA-MIXED   COPD (chronic obstructive pulmonary disease) with emphysema   PAF (paroxysmal atrial fibrillation)   Acute respiratory failure   Acute on chronic  diastolic congestive heart failure    Time spent: >35 minutes     Kinnie Feil  [facility] Pager [phone]. If 7PM-7AM, please contact night-coverage at [email or web address], password [facility] 01/19/2014, 8:48 AM  LOS: 5 days

## 2014-01-19 NOTE — Progress Notes (Signed)
Subjective: Breathing a little better.  Objective: Vital signs in last 24 hours: Temp:  [97.4 F (36.3 C)-98 F (36.7 C)] 97.8 F (36.6 C) (02/23 0500) Pulse Rate:  [78-99] 78 (02/23 0500) Resp:  [18-20] 18 (02/23 0500) BP: (101-110)/(34-45) 105/34 mmHg (02/23 0924) SpO2:  [95 %-100 %] 96 % (02/23 0835) Weight:  [301 lb 5.9 oz (136.7 kg)] 301 lb 5.9 oz (136.7 kg) (02/23 0500) Last BM Date: 01/15/14  Intake/Output from previous day: 02/22 0701 - 02/23 0700 In: 600 [P.O.:600] Out: 1550 [Urine:1550] Intake/Output this shift: Total I/O In: 360 [P.O.:360] Out: 650 [Urine:650]  Medications Current Facility-Administered Medications  Medication Dose Route Frequency Provider Last Rate Last Dose  . acetaminophen (TYLENOL) tablet 325 mg  325 mg Oral Daily PRN Phillips Climes, MD   325 mg at 01/15/14 2337  . ezetimibe (ZETIA) tablet 10 mg  10 mg Oral Daily Phillips Climes, MD   10 mg at 01/19/14 8338   And  . atorvastatin (LIPITOR) tablet 20 mg  20 mg Oral q1800 Dawood Elgergawy, MD   20 mg at 01/18/14 1710  . budesonide-formoterol (SYMBICORT) 80-4.5 MCG/ACT inhaler 2 puff  2 puff Inhalation BID Phillips Climes, MD   2 puff at 01/19/14 2505  . cholecalciferol (VITAMIN D) tablet 1,000 Units  1,000 Units Oral Daily Phillips Climes, MD   1,000 Units at 01/19/14 [phone]  . dabigatran (PRADAXA) capsule 150 mg  150 mg Oral Q12H Dionne Milo, NP   150 mg at 01/19/14 7341  . diltiazem (CARDIZEM SR) 12 hr capsule 120 mg  120 mg Oral Q12H Phillips Climes, MD   120 mg at 01/19/14 0924  . docusate sodium (COLACE) capsule 100 mg  100 mg Oral BID Kinnie Feil, MD   100 mg at 01/19/14 0924  . furosemide (LASIX) tablet 40 mg  40 mg Oral Daily Mihai Croitoru, MD   40 mg at 01/19/14 0924  . insulin aspart (novoLOG) injection 0-20 Units  0-20 Units Subcutaneous TID WC Kinnie Feil, MD      . insulin glargine (LANTUS) injection 15 Units  15 Units Subcutaneous QHS Kinnie Feil, MD       . ipratropium-albuterol (DUONEB) 0.5-2.5 (3) MG/3ML nebulizer solution 3 mL  3 mL Nebulization BID Phillips Climes, MD   3 mL at 01/19/14 0834  . irbesartan (AVAPRO) tablet 75 mg  75 mg Oral Daily Phillips Climes, MD   75 mg at 01/19/14 9379  . levalbuterol (XOPENEX) nebulizer solution 1.25 mg  1.25 mg Nebulization Q4H PRN Dayna N Dunn, PA-C   1.25 mg at 01/15/14 1716  . levofloxacin (LEVAQUIN) tablet 500 mg  500 mg Oral Daily Kinnie Feil, MD   500 mg at 01/19/14 0959  . ondansetron (ZOFRAN) injection 4 mg  4 mg Intravenous Q8H PRN Kinnie Feil, MD   4 mg at 01/18/14 1313  . oxyCODONE-acetaminophen (PERCOCET/ROXICET) 5-325 MG per tablet 1 tablet  1 tablet Oral Q6H PRN Ritta Slot, NP   1 tablet at 01/18/14 2319  . polyethylene glycol (MIRALAX / GLYCOLAX) packet 17 g  17 g Oral Daily Kinnie Feil, MD   17 g at 01/19/14 0924  . predniSONE (DELTASONE) tablet 30 mg  30 mg Oral Q breakfast Kinnie Feil, MD   30 mg at 01/19/14 0925  . senna (SENOKOT) tablet 8.6 mg  1 tablet Oral Daily Kinnie Feil, MD   8.6 mg at 01/19/14 0924  . vitamin C (ASCORBIC  ACID) tablet 500 mg  500 mg Oral Daily Phillips Climes, MD   500 mg at 01/19/14 5993  . vitamin E capsule 400 Units  400 Units Oral Daily Phillips Climes, MD   400 Units at 01/19/14 5701    PE: General appearance: alert, cooperative, no distress and Sitting on the edge of the bed.  Appears comfortable. Lungs: Decreased BS.  No wheezing.  Heart: regular rate and rhythm and 1/6 sys MM. Abdomen: +BS,  Firm in the periumbilical region.  Nontender.  Extremities: 3+ Tense LLE Pulses: 2+ and symmetric Skin: Warm and dry. Neurologic: Grossly normal  Lab Results:  No results found for this basename: WBC, HGB, HCT, PLT,  in the last 72 hours BMET  Recent Labs  01/18/14 1230 01/19/14 0525 01/19/14 0745  NA 135* 133*  --   K 4.6 6.8* 5.0  CL 97 99  --   CO2 23 18*  --   GLUCOSE 269* 300*  --   BUN 67* 70*  --   CREATININE  1.59* 1.50*  --   CALCIUM 8.3* 7.4*  --     Assessment/Plan    Active Problems:   HYPERLIPIDEMIA-MIXED   COPD (chronic obstructive pulmonary disease) with emphysema   PAF (paroxysmal atrial fibrillation)   Acute respiratory failure   Acute on chronic diastolic congestive heart failure    Plan:  Diastolic CHF:   Net fluids:  -1.0L/-0.3L.  No change in weight.  On po Lasix 40mg  daily.  EF 60% with no wall motion abnormalities.   Peak PA pressure 3mmHg.  Will recheck BNP today(previous 4957.0).  She has a considerable amount of LE edema which may be partially from venous insufficiency.   If BNP has not changed much, I would change back to IV lasix 40mg  BID.   Maintaining NSR with PACs.  On pradaxa and diltiazem 120mg  q12hr.  Not on beta blocker due to COPD.  BP stable.      LOS: 5 days    Paige Hardin 01/19/2014 10:03 AM

## 2014-01-19 NOTE — Progress Notes (Signed)
CRITICAL VALUE ALERT  Critical value received:  K 6.8    Date of notification:  2/23   Time of notification:  0645  Critical value read back:yes  Nurse who received alert:  Yetta Glassman, RN  MD notified (1st page):  Dr. Daleen Bo  Time of first page:  0700  MD notified (2nd page):  Time of second page:  Responding MD:  Daleen Bo   Time MD responded:  [phone]

## 2014-01-19 NOTE — Progress Notes (Addendum)
This is a difficult situation with presumed diastolic heart failure. No net negative diuresis or weight loss since admission. Developing azotemia related to steroids. Probably needs further diuresis. Will give 80 mg IV q 8 hrs for 3 doses.

## 2014-01-19 NOTE — Care Management Note (Addendum)
    Page 1 of 1   01/22/2014     12:38:03 PM   CARE MANAGEMENT NOTE 01/22/2014  Patient:  Paige Hardin, Paige Hardin   Account Number:  [id number]  Date Initiated:  01/19/2014  Documentation initiated by:  GRAVES-BIGELOW,Brigetta Beckstrom  Subjective/Objective Assessment:   Hx of COPD, diastolic CHF, active afib on pradaxa , not using any home oxygen, he presents with complaints of SOB/cough.     Action/Plan:   Plan will be for SNF when medically stable. Continues on IV lasix. CSW to assist with disposition needs.   Anticipated DC Date:  01/21/2014   Anticipated DC Plan:  SKILLED NURSING FACILITY  In-house referral  Clinical Social Worker      DC Planning Services  CM consult      Choice offered to / List presented to:             Status of service:  Completed, signed off Medicare Important Message given?   (If response is "NO", the following Medicare IM given date fields will be blank) Date Medicare IM given:   Date Additional Medicare IM given:    Discharge Disposition:  [place]  Per UR Regulation:  Reviewed for med. necessity/level of care/duration of stay  If discussed at [place] of Stay Meetings, dates discussed:   01/22/2014    Comments:  01-22-14 [place], RN, BSN [phone] Pt was discussed in the [place] meeting today. Plan was for SNF at [facility] however, bp low this am and lasix on hold. Staff RN held cardizem.  Chest Xray done.  CM will continue to monitor for disposition.

## 2014-01-19 NOTE — Discharge Instructions (Signed)
Information on my medicine - Pradaxa® (dabigatran) ° °Why was Pradaxa® prescribed for you? °Pradaxa® was prescribed for you to reduce the risk of forming blood clots that cause a stroke if you have a medical condition called atrial fibrillation (a type of irregular heartbeat).   ° °What do you Need to know about PradAXa®? °Take your Pradaxa® TWICE DAILY - one capsule in the morning and one tablet in the evening with or without food.  It would be best to take the doses about the same time each day. ° °The capsules should not be broken, chewed or opened - they must be swallowed whole. ° °Do not store Pradaxa in other medication containers - once the bottle is opened the Pradaxa should be used within FOUR months; throw away any capsules that haven’t been by that time. ° °Take Pradaxa® exactly as prescribed by your doctor.  DO NOT stop taking Pradaxa® without talking to the doctor who prescribed the medication.  Stopping without other stroke prevention medication to take the place of Pradaxa may increase your risk of developing a clot that causes a stroke.  Refill your prescription before you run out. ° °After discharge, you should have regular check-up appointments with your healthcare provider that is prescribing your Pradaxa®.  In the future your dose may need to be changed if your kidney function or weight changes by a significant amount. ° °What do you do if you miss a dose? °If you miss a dose, take it as soon as you remember on the same day.  If your next dose is less than 6 hours away, skip the missed dose.  Do not take two doses of PRADAXA at the same time. ° °Important Safety Information °A possible side effect of Pradaxa® is bleeding. You should call your healthcare provider right away if you experience any of the following: °  Bleeding from an injury or your nose that does not stop. °  Unusual colored urine (red or dark brown) or unusual colored stools (red or black). °  Unusual bruising for unknown  reasons. °  A serious fall or if you hit your head (even if there is no bleeding). ° °Some medicines may interact with Pradaxa® and might increase your risk of bleeding or clotting while on Pradaxa®. To help avoid this, consult your healthcare provider or pharmacist prior to using any new prescription or non-prescription medications, including herbals, vitamins, non-steroidal anti-inflammatory drugs (NSAIDs) and supplements. ° °This website has more information on Pradaxa® (dabigatran): [email or web address].com. °

## 2014-01-20 DIAGNOSIS — I5033 Acute on chronic diastolic (congestive) heart failure: Secondary | ICD-10-CM | POA: Diagnosis not present

## 2014-01-20 DIAGNOSIS — J9600 Acute respiratory failure, unspecified whether with hypoxia or hypercapnia: Secondary | ICD-10-CM | POA: Diagnosis not present

## 2014-01-20 DIAGNOSIS — I4891 Unspecified atrial fibrillation: Secondary | ICD-10-CM | POA: Diagnosis not present

## 2014-01-20 DIAGNOSIS — I509 Heart failure, unspecified: Secondary | ICD-10-CM | POA: Diagnosis not present

## 2014-01-20 LAB — BASIC METABOLIC PANEL
BUN: 68 mg/dL — ABNORMAL HIGH (ref 6–23)
CO2: 27 meq/L (ref 19–32)
Calcium: 7.8 mg/dL — ABNORMAL LOW (ref 8.4–10.5)
Chloride: 96 mEq/L (ref 96–112)
Creatinine, Ser: 1.62 mg/dL — ABNORMAL HIGH (ref 0.50–1.10)
GFR calc Af Amer: 37 mL/min — ABNORMAL LOW (ref >90)
GFR calc non Af Amer: 32 mL/min — ABNORMAL LOW (ref >90)
GLUCOSE: 231 mg/dL — AB (ref 70–99)
Potassium: 4.4 mEq/L (ref 3.7–5.3)
Sodium: 136 mEq/L — ABNORMAL LOW (ref 137–147)

## 2014-01-20 LAB — GLUCOSE, CAPILLARY
Glucose-Capillary: 173 mg/dL — ABNORMAL HIGH (ref 70–99)
Glucose-Capillary: 154 mg/dL — ABNORMAL HIGH (ref 70–99)
Glucose-Capillary: 170 mg/dL — ABNORMAL HIGH (ref 70–99)
Glucose-Capillary: 209 mg/dL — ABNORMAL HIGH (ref 70–99)

## 2014-01-20 MED ORDER — BISACODYL 10 MG RE SUPP: 10.0000 mg | Freq: Every day | RECTAL | Status: DC | PRN

## 2014-01-20 MED ORDER — FUROSEMIDE 80 MG PO TABS
80.0000 mg | ORAL_TABLET | Freq: Two times a day (BID) | ORAL | Status: AC
  Administered 2014-01-20 (×2): 80 mg via ORAL
  Filled 2014-01-20 (×2): qty 1

## 2014-01-20 NOTE — Progress Notes (Signed)
Physical Therapy Treatment Patient Details Name: Paige Hardin MRN: [id number] DOB: [birth year] Today's Date: 01/20/2014 Time: 6812-7517 PT Time Calculation (min): 17 min  PT Assessment / Plan / Recommendation  History of Present Illness 68 y.o. female admitted to [facility] on 01/14/14 with history of COPD, diastolic CHF, active fibrillation on pradaxa , not using any home oxygen, he presents with complaints of shortness of breath, cough, nonproductive, patient was noticed to be hypoxic in ED, requiring 3 L nasal cannula, had significant wheezing, improved after receiving nebulizer treatment, her chest x-ray showing a vascular cause congestion, as well patient complains of worsening lower extremity edema, she is known to have history of diastolic CHF, the patient was noticed to be in A. fib with RVR.     PT Comments   Pt is progressing well with therapy today.  She has less reported right leg pain and increased strength in right leg compared to evaluation.  She continues to be limited by significant increase in DOE with gait 3/4 today, but was able to walk a bit further with improved posture.  Despite DOE levels being high, her O2 sats remained above 90% on RA.    Follow Up Recommendations  SNF     Does the patient have the potential to tolerate intense rehabilitation    NA  Barriers to Discharge   She lives alone and her endurance is very poor.        Equipment Recommendations  Rolling walker with 5" wheels;Other (comment) (bariatric)    Recommendations for Other Services   None  Frequency Min 2X/week   Progress towards PT Goals Progress towards PT goals: Progressing toward goals  Plan Current plan remains appropriate    Precautions / Restrictions Precautions Precautions: Fall;Other (comment) Precaution Comments: Monitor O2 sats on RA   Pertinent Vitals/Pain DOE Increased to 3/4 during gait, but O2 sats again remained stable (low 90s on RA) despite dyspnea levels.    See vitals flow sheet.     Mobility  Transfers Overall transfer level: Modified independent Equipment used: Rolling walker (2 wheeled) Transfers: Sit to/from Stand General transfer comment: mod I using armrests on recliner chair to control transitions.  Per pt she has been going short distances to bed to [facility] and back to recliner chair today on her own.   Ambulation/Gait Ambulation/Gait assistance: Min guard Ambulation Distance (Feet): 40 Feet Assistive device: Rolling walker (2 wheeled) Gait Pattern/deviations: Step-through pattern;Shuffle;Trunk flexed Gait velocity: decreased Gait velocity interpretation: Below normal speed for age/gender General Gait Details: Pt with better posture today until she gets fatigued then she relies on the RW more.  She reports that she notices with gait that her right leg is weaker than her left (and then reports both of them seem weaker than usual).  No buckling noted.  DOE Increased to 3/4 during gait, but O2 sats again remained stable (low 90s on RA) despite dyspnea levels.      Exercises General Exercises - Upper Extremity Shoulder Flexion: AROM;Both;10 reps;Seated Elbow Flexion: AROM;Both;10 reps;Seated    PT Goals (current goals can now be found in the care plan section) Acute Rehab PT Goals Patient Stated Goal: to get better and stronger.  To maybe move to an assisted living apartment after rehab.    Visit Information  Last PT Received On: 01/20/14 Assistance Needed: +1 History of Present Illness: 68 y.o. female admitted to [facility] on 01/14/14 with history of COPD, diastolic CHF, active fibrillation on pradaxa , not using any home oxygen, he  presents with complaints of shortness of breath, cough, nonproductive, patient was noticed to be hypoxic in ED, requiring 3 L nasal cannula, had significant wheezing, improved after receiving nebulizer treatment, her chest x-ray showing a vascular cause congestion, as well patient complains of worsening lower extremity edema, she is known to  have history of diastolic CHF, the patient was noticed to be in A. fib with RVR.      Subjective Data  Subjective: Pt reports feeling better than last PT session.  Right leg still hurts, but has improved.   Patient Stated Goal: to get better and stronger.  To maybe move to an assisted living apartment after rehab.     Cognition  Cognition Arousal/Alertness: Awake/alert Behavior During Therapy: WFL for tasks assessed/performed (much better spirits today) Overall Cognitive Status: Within Functional Limits for tasks assessed    Balance  Balance Overall balance assessment: Needs assistance Standing balance support: Single extremity supported;Bilateral upper extremity supported Standing balance-Leahy Scale: Fair General Comments General comments (skin integrity, edema, etc.): Pt reports she is continuing to do her leg exercises as instructed last session.  She is now able to do a LAQ on her right leg without pain, but is still unable to lift her right knee (hip flexion) without significant increase in right leg pain.    End of Session PT - End of Session Activity Tolerance: Patient limited by fatigue;Patient limited by pain (limited by DOE) Patient left: in chair;with call bell/phone within reach    [place] B. [place], [place], DPT [phone]   01/20/2014, 1:02 PM

## 2014-01-20 NOTE — Progress Notes (Signed)
[facility] PROGRESS NOTE  Paige Hardin TMA:[id number] DOB: 01-05-1946 DOA: 01/14/2014 PCP: Stephens Shire, MD  Assessment/Plan:  Active Problems:  Acute respiratory failure  HYPERLIPIDEMIA-MIXED  DIASTOLIC HEART FAILURE, CHRONIC  COPD (chronic obstructive pulmonary disease) with emphysema  ATRIAL FIBRILLATION, HX OF   68 y/o female with PMH of HTN, HPL, COPD, CKD, DM, CHF AFIb on pradaxa presented with progressive SOB, admitted with CHF, COPD exacerbation   1. COPD exacerbation;  -improved; d/c IV steroids change to PO, cont bronchodilators, oxygen, d/c atx;    2. CHF exacerbation likely diastolic HF; echo: LVEF 25%, diastolic HF; LE edema likely CHF+venous stasis  -per cardiology; diuresing; improving; appreciate cardiology input; d/c pioglitozone   3. Acute respiratory failure: as above, desat study prior to discharge   4. A. fib with RVR: cont Cardizem, dabigatran    5. Hyperlipidemia: Continue with Vytorin  6. DM HA1C-6.1 -glucose uncontrolled with steroids; started lantus 10 U+ISS for now; increase lantus + ISS;  -d/c pioglitozone with CHF;   Code Status: DNR Family Communication: d/w patient, updated Flenniken,Johnny Spouse [phone]  (indicate person spoken with, relationship, and if by phone, the number) Disposition Plan: SNF when clinically better 1-2 days per cardiology     Consultants:  Cardiology   Procedures:  None   Antibiotics:  levofloxacin 2/19<<<<2/24(indicate start date, and stop date if known)  HPI/Subjective: alert  Objective: Filed Vitals:   01/20/14 1103  BP: 120/41  Pulse: 86  Temp:   Resp:     Intake/Output Summary (Last 24 hours) at 01/20/14 1135 Last data filed at 01/20/14 0846  Gross per 24 hour  Intake    720 ml  Output   2300 ml  Net  -1580 ml   Filed Weights   01/18/14 0520 01/19/14 0500 01/20/14 0540  Weight: 136.3 kg (300 lb 7.8 oz) 136.7 kg (301 lb 5.9 oz) 135.2 kg (298 lb 1 oz)    Exam:   General:   alert  Cardiovascular: s1,s2 rrr  Respiratory: few wheezing in LL  Abdomen: soft, nt, nd   Musculoskeletal: LE edema   Data Reviewed: Basic Metabolic Panel:  Recent Labs Lab 01/15/14 0505 01/16/14 0347 01/18/14 1230 01/19/14 0525 01/19/14 0745 01/20/14 0425  NA 137 136* 135* 133*  --  136*  K 5.1 5.0 4.6 6.8* 5.0 4.4  CL 99 96 97 99  --  96  CO2 _0 18*  --  27  GLUCOSE 326* 300* 269* 300*  --  231*  BUN 31* 45* 67* 70*  --  68*  CREATININE 1.34* 1.65* 1.59* 1.50*  --  1.62*  CALCIUM 9.2 8.9 8.3* 7.4*  --  7.8*   Liver Function Tests: No results found for this basename: AST, ALT, ALKPHOS, BILITOT, PROT, ALBUMIN,  in the last 168 hours No results found for this basename: LIPASE, AMYLASE,  in the last 168 hours No results found for this basename: AMMONIA,  in the last 168 hours CBC:  Recent Labs Lab 01/14/14 1812 01/15/14 0505  WBC 15.8* 10.8*  NEUTROABS 14.2*  --   HGB 9.6* 9.4*  HCT 29.8* 30.0*  MCV 93.7 94.0  PLT 295 275   Cardiac Enzymes: No results found for this basename: CKTOTAL, CKMB, CKMBINDEX, TROPONINI,  in the last 168 hours BNP (last 3 results)  Recent Labs  01/14/14 1705 01/19/14 1200  PROBNP 4957.0* 2192.0*   CBG:  Recent Labs Lab 01/19/14 0755 01/19/14 1141 01/19/14 1702 01/19/14 2037 01/20/14 0811  GLUCAP 303*  172* 271* 145* 173*    Recent Results (from the past 240 hour(s))  RESPIRATORY VIRUS PANEL     Status: None   Collection Time    01/15/14  1:14 PM      Result Value Ref Range Status   Source - RVPAN NASAL SWAB   Corrected   Comment: CORRECTED ON 02/20 AT 2229: PREVIOUSLY REPORTED AS NASAL SWAB   Respiratory Syncytial Virus A NOT DETECTED   Final   Respiratory Syncytial Virus B NOT DETECTED   Final   Influenza A NOT DETECTED   Final   Influenza B NOT DETECTED   Final   Parainfluenza 1 NOT DETECTED   Final   Parainfluenza 2 NOT DETECTED   Final   Parainfluenza 3 NOT DETECTED   Final   Metapneumovirus NOT  DETECTED   Final   Rhinovirus NOT DETECTED   Final   Adenovirus NOT DETECTED   Final   Influenza A H1 NOT DETECTED   Final   Influenza A H3 NOT DETECTED   Final   Comment: (NOTE)           Normal Reference Range for each Analyte: NOT DETECTED     Testing performed using the Luminex xTAG Respiratory Viral Panel test     kit.     This test was developed and its performance characteristics determined     by [facility]. It has not been cleared or approved by the [place]     Food and Drug Administration. This test is used for clinical purposes.     It should not be regarded as investigational or for research. This     laboratory is certified under the [place] (CLIA) as qualified to perform high complexity     clinical laboratory testing.     Performed at [facility]     Studies: No results found.  Scheduled Meds: . ezetimibe  10 mg Oral Daily   And  . atorvastatin  20 mg Oral q1800  . budesonide-formoterol  2 puff Inhalation BID  . cholecalciferol  1,000 Units Oral Daily  . dabigatran  150 mg Oral Q12H  . diltiazem  120 mg Oral Q12H  . docusate sodium  100 mg Oral BID  . furosemide  80 mg Oral BID  . insulin aspart  0-20 Units Subcutaneous TID WC  . insulin glargine  15 Units Subcutaneous QHS  . ipratropium-albuterol  3 mL Nebulization BID  . irbesartan  75 mg Oral Daily  . levofloxacin  500 mg Oral Daily  . pantoprazole  40 mg Oral Daily  . polyethylene glycol  17 g Oral Daily  . predniSONE  30 mg Oral Q breakfast  . senna  1 tablet Oral Daily  . vitamin C  500 mg Oral Daily  . vitamin E  400 Units Oral Daily   Continuous Infusions:   Active Problems:   HYPERLIPIDEMIA-MIXED   COPD (chronic obstructive pulmonary disease) with emphysema   PAF (paroxysmal atrial fibrillation)   Acute respiratory failure   Acute on chronic diastolic congestive heart failure    Time spent: >35 minutes     Kinnie Feil  [facility] Pager [phone]. If 7PM-7AM, please contact night-coverage at [email or web address], password [facility] 01/20/2014, 11:35 AM  LOS: 6 days

## 2014-01-20 NOTE — Progress Notes (Signed)
Pt with poor IV access, infiltrated with 2nd dose of IV lasix. NP on call made aware, holding dose for now to re evaluate access in AM. Will continue to monitor.

## 2014-01-20 NOTE — Progress Notes (Addendum)
       Patient Name: Paige Hardin Date of Encounter: 01/20/2014    SUBJECTIVE:Breathing better after diuresis TELEMETRY:  A fib with controlled rate Filed Vitals:   01/19/14 1335 01/19/14 2052 01/19/14 2145 01/20/14 0540  BP: 125/48  114/47 119/44  Pulse: 81  68 75  Temp: 98.4 F (36.9 C)  98.1 F (36.7 C) 97.7 F (36.5 C)  TempSrc: Oral  Oral Oral  Resp: 18  18 18   Height:      Weight:    298 lb 1 oz (135.2 kg)  SpO2: 98% 98% 96% 99%    Intake/Output Summary (Last 24 hours) at 01/20/14 0915 Last data filed at 01/20/14 0846  Gross per 24 hour  Intake    720 ml  Output   2300 ml  Net  -1580 ml    LABS: Basic Metabolic Panel:  Recent Labs  01/19/14 0525 01/19/14 0745 01/20/14 0425  NA 133*  --  136*  K 6.8* 5.0 4.4  CL 99  --  96  CO2 18*  --  27  GLUCOSE 300*  --  231*  BUN 70*  --  68*  CREATININE 1.50*  --  1.62*  CALCIUM 7.4*  --  7.8*   BNP    Component Value Date/Time   PROBNP 2192.0* 01/19/2014 1200     Radiology/Studies:  No new data  Physical Exam: Blood pressure 119/44, pulse 75, temperature 97.7 F (36.5 C), temperature source Oral, resp. rate 18, height 5\' 5"  (1.651 m), weight 298 lb 1 oz (135.2 kg), SpO2 99.00%. Weight change: -3 lb 4.9 oz (-1.5 kg)   Marked obesity Decreased basilar breath sounds  ASSESSMENT:  1. Acute on chronic diastolic HF, improved with diuresis 2. A fib with controlled rate. 3. CKD 3, stable  Plan:  1. IV came out and received only one dose IV lasix. Will convert to PO 80 mg BID  Demetrios Isaacs 01/20/2014, 9:15 AM

## 2014-01-21 DIAGNOSIS — I272 Pulmonary hypertension, unspecified: Secondary | ICD-10-CM | POA: Diagnosis present

## 2014-01-21 DIAGNOSIS — N183 Chronic kidney disease, stage 3 unspecified: Secondary | ICD-10-CM | POA: Diagnosis present

## 2014-01-21 DIAGNOSIS — I4891 Unspecified atrial fibrillation: Secondary | ICD-10-CM | POA: Diagnosis not present

## 2014-01-21 DIAGNOSIS — J9600 Acute respiratory failure, unspecified whether with hypoxia or hypercapnia: Secondary | ICD-10-CM | POA: Diagnosis not present

## 2014-01-21 DIAGNOSIS — I5033 Acute on chronic diastolic (congestive) heart failure: Secondary | ICD-10-CM | POA: Diagnosis not present

## 2014-01-21 DIAGNOSIS — I509 Heart failure, unspecified: Secondary | ICD-10-CM | POA: Diagnosis not present

## 2014-01-21 DIAGNOSIS — Z7901 Long term (current) use of anticoagulants: Secondary | ICD-10-CM

## 2014-01-21 DIAGNOSIS — I10 Essential (primary) hypertension: Secondary | ICD-10-CM | POA: Diagnosis present

## 2014-01-21 DIAGNOSIS — E1165 Type 2 diabetes mellitus with hyperglycemia: Secondary | ICD-10-CM | POA: Diagnosis present

## 2014-01-21 LAB — GLUCOSE, CAPILLARY
GLUCOSE-CAPILLARY: 215 mg/dL — AB (ref 70–99)
GLUCOSE-CAPILLARY: 186 mg/dL — AB (ref 70–99)
Glucose-Capillary: 120 mg/dL — ABNORMAL HIGH (ref 70–99)
Glucose-Capillary: 168 mg/dL — ABNORMAL HIGH (ref 70–99)

## 2014-01-21 LAB — BASIC METABOLIC PANEL
BUN: 63 mg/dL — ABNORMAL HIGH (ref 6–23)
CO2: 28 mEq/L (ref 19–32)
Calcium: 8 mg/dL — ABNORMAL LOW (ref 8.4–10.5)
Chloride: 95 mEq/L — ABNORMAL LOW (ref 96–112)
Creatinine, Ser: 1.66 mg/dL — ABNORMAL HIGH (ref 0.50–1.10)
GFR calc Af Amer: 36 mL/min — ABNORMAL LOW (ref >90)
GFR calc non Af Amer: 31 mL/min — ABNORMAL LOW (ref >90)
GLUCOSE: 112 mg/dL — AB (ref 70–99)
Potassium: 4.1 mEq/L (ref 3.7–5.3)
Sodium: 136 mEq/L — ABNORMAL LOW (ref 137–147)

## 2014-01-21 MED ORDER — FUROSEMIDE 40 MG PO TABS
40.0000 mg | ORAL_TABLET | Freq: Every day | ORAL | Status: DC
  Administered 2014-01-21: 40 mg via ORAL
  Filled 2014-01-21 (×2): qty 1

## 2014-01-21 MED ORDER — DILTIAZEM HCL ER COATED BEADS 120 MG PO CP24
120.0000 mg | ORAL_CAPSULE | Freq: Every day | ORAL | Status: DC
  Administered 2014-01-21 – 2014-01-23 (×2): 120 mg via ORAL
  Filled 2014-01-21 (×3): qty 1

## 2014-01-21 MED ORDER — PREDNISONE 20 MG PO TABS
20.0000 mg | ORAL_TABLET | Freq: Every day | ORAL | Status: DC
  Administered 2014-01-22 – 2014-01-23 (×2): 20 mg via ORAL
  Filled 2014-01-21 (×3): qty 1

## 2014-01-21 NOTE — Progress Notes (Signed)
We should reduce diuretic regimen intensity to 40 mg daily until BP recovers. May skip today if BP remains low.. Decrease the Diltiazem dose to CD 120 mg daily.

## 2014-01-21 NOTE — Progress Notes (Signed)
Patient ID: Paige Hardin  female  ZPH:[id number]    DOB: 05/08/1946    DOA: 01/14/2014  PCP: Stephens Shire, MD  Assessment/Plan:  1. COPD exacerbation  -improved; d/c IV steroids change to PO, cont bronchodilators, oxygen,  2. CHF exacerbation likely diastolic HF; echo: LVEF 80%, diastolic HF; LE edema likely CHF+venous stasis  -per cardiology; diuresing; improving; appreciate cardiology input -  d/c pioglitozone  - patient dizzy and lightheaded with borderline BP, Lasix decreased to 40 mg daily until BP improves - Will hold all BP meds today until patient is medically stable  3. Acute respiratory failure: as above - Home O2 evaluation prior to discharge   4. A. fib with RVR:  - cont Cardizem decreased to 120 mg daily,  - Continue pradaxa  5. Hyperlipidemia: Continue with Vytorin   6. DM HA1C-6.1  -glucose uncontrolled with steroids; started lantus 10 U+ISS for now; increase lantus + ISS;  -d/c pioglitozone with CHF;  - Taper prednisone in a.m.   DVT Prophylaxis:pradaxa  Code Status: DNR  Family Communication:  Disposition: likely in AM, if stable  Consultants:  Cardiology   Procedures:  2-D echo  Antibiotics:  none  Subjective: feeling dizzy and lightheaded borderline BP in the morning   Objective: Weight change: 4.19 kg (9 lb 3.8 oz)  Intake/Output Summary (Last 24 hours) at 01/21/14 1457 Last data filed at 01/21/14 0805  Gross per 24 hour  Intake    240 ml  Output   1250 ml  Net  -1010 ml   Blood pressure 114/41, pulse 89, temperature 97.5 F (36.4 C), temperature source Oral, resp. rate 18, height 5' 5"  (1.651 m), weight 139.39 kg (307 lb 4.8 oz), SpO2 94.00%.  Physical Exam: General: Alert and awake, oriented x3 CVS: S1-S2 clear, no murmur rubs or gallops Chest: clear to auscultation bilaterally, no wheezing, rales or rhonchi Abdomen: soft nontender, nondistended, normal bowel sounds  Extremities: no cyanosis, clubbing or edema noted  bilaterally Neuro: Cranial nerves II-XII intact, no focal neurological deficits  Lab Results: Basic Metabolic Panel:  Recent Labs Lab 01/20/14 0425 01/21/14 0400  NA 136* 136*  K 4.4 4.1  CL 96 95*  CO2 27 28  GLUCOSE 231* 112*  BUN 68* 63*  CREATININE 1.62* 1.66*  CALCIUM 7.8* 8.0*   Liver Function Tests: No results found for this basename: AST, ALT, ALKPHOS, BILITOT, PROT, ALBUMIN,  in the last 168 hours No results found for this basename: LIPASE, AMYLASE,  in the last 168 hours No results found for this basename: AMMONIA,  in the last 168 hours CBC:  Recent Labs Lab 01/14/14 1812 01/15/14 0505  WBC 15.8* 10.8*  NEUTROABS 14.2*  --   HGB 9.6* 9.4*  HCT 29.8* 30.0*  MCV 93.7 94.0  PLT 295 275   Cardiac Enzymes: No results found for this basename: CKTOTAL, CKMB, CKMBINDEX, TROPONINI,  in the last 168 hours BNP: No components found with this basename: POCBNP,  CBG:  Recent Labs Lab 01/20/14 1142 01/20/14 1551 01/20/14 2047 01/21/14 0803 01/21/14 1204  GLUCAP 154* 170* 209* 120* 215*     Micro Results: Recent Results (from the past 240 hour(s))  RESPIRATORY VIRUS PANEL     Status: None   Collection Time    01/15/14  1:14 PM      Result Value Ref Range Status   Source - RVPAN NASAL SWAB   Corrected   Comment: CORRECTED ON 02/20 AT 2229: PREVIOUSLY REPORTED AS NASAL SWAB  Respiratory Syncytial Virus A NOT DETECTED   Final   Respiratory Syncytial Virus B NOT DETECTED   Final   Influenza A NOT DETECTED   Final   Influenza B NOT DETECTED   Final   Parainfluenza 1 NOT DETECTED   Final   Parainfluenza 2 NOT DETECTED   Final   Parainfluenza 3 NOT DETECTED   Final   Metapneumovirus NOT DETECTED   Final   Rhinovirus NOT DETECTED   Final   Adenovirus NOT DETECTED   Final   Influenza A H1 NOT DETECTED   Final   Influenza A H3 NOT DETECTED   Final   Comment: (NOTE)           Normal Reference Range for each Analyte: NOT DETECTED     Testing performed using  the Luminex xTAG Respiratory Viral Panel test     kit.     This test was developed and its performance characteristics determined     by [facility]. It has not been cleared or approved by the [place]     Food and Drug Administration. This test is used for clinical purposes.     It should not be regarded as investigational or for research. This     laboratory is certified under the [place] (CLIA) as qualified to perform high complexity     clinical laboratory testing.     Performed at [facility]    Studies/Results: Dg Chest Portable 1 View  01/14/2014   CLINICAL DATA:  Worsening dyspnea, history of emphysema and CHF  EXAM: PORTABLE CHEST - 1 VIEW  COMPARISON:  CT CHEST W/O CM dated 09/09/2010  FINDINGS: The lungs are well-expanded. The interstitial markings are mildly increased. The pulmonary vascularity is prominent centrally and there is mild cephalization. The cardiac silhouette is mildly enlarged. There is no pleural effusion. There is no alveolar infiltrate. The mediastinum is normal in width. There is tortuosity of the descending thoracic aorta. The observed portions of the bony thorax appear normal.  IMPRESSION: Findings are consistent with CHF superimposed upon COPD. There is no alveolar pneumonia.   Electronically Signed   By: David  Martinique   On: 01/14/2014 16:43    Medications: Scheduled Meds: . ezetimibe  10 mg Oral Daily   And  . atorvastatin  20 mg Oral q1800  . budesonide-formoterol  2 puff Inhalation BID  . cholecalciferol  1,000 Units Oral Daily  . dabigatran  150 mg Oral Q12H  . diltiazem  120 mg Oral Daily  . docusate sodium  100 mg Oral BID  . furosemide  40 mg Oral Daily  . insulin aspart  0-20 Units Subcutaneous TID WC  . insulin glargine  15 Units Subcutaneous QHS  . ipratropium-albuterol  3 mL Nebulization BID  . irbesartan  75 mg Oral Daily  . pantoprazole  40 mg Oral Daily  . polyethylene glycol  17  g Oral Daily  . predniSONE  30 mg Oral Q breakfast  . senna  1 tablet Oral Daily  . vitamin C  500 mg Oral Daily  . vitamin E  400 Units Oral Daily      LOS: 7 days   RAI,RIPUDEEP M.D. [facility] 01/21/2014, 2:57 PM Pager: [phone]  If 7PM-7AM, please contact night-coverage [email or web address] Password [email or web address]

## 2014-01-21 NOTE — Progress Notes (Signed)
CSW (Clinical Education officer, museum) continues to follow and update facility. Pt has a bed at [facility] but will need to dc early on day of dc to complete paperwork.  [place], [place]

## 2014-01-21 NOTE — Progress Notes (Signed)
Patient: Paige Hardin / Admit Date: 01/14/2014 / Date of Encounter: 01/21/2014, 10:38 AM   Subjective  Breathing feels better - more comfortable - but weight up 8 lbs (?). She does feel weak and slightly dizzy today. BP running low before meds. Slight LLE pain.  Objective   Telemetry: NSR with PACs today. Brief episode of atrial tach overnight  Physical Exam: Blood pressure 94/38, pulse 82, temperature 97.5 F (36.4 C), temperature source Oral, resp. rate 18, height 5\' 5"  (1.651 m), weight 307 lb 4.8 oz (139.39 kg), SpO2 94.00%. General: Well developed, well nourished obese WF, in no acute distress. Head: Normocephalic, atraumatic, sclera non-icteric, no xanthomas, nares are without discharge. Neck: Negative for carotid bruits. JVP not elevated. Lungs: Moderate air movement, no wheezes, rales, or rhonchi. Breathing is unlabored. Heart: RRR S1 S2 without murmurs, rubs, or gallops.  Abdomen: Soft, non-tender, non-distended with normoactive bowel sounds. No rebound/guarding. Extremities: No clubbing or cyanosis. 1+ LEE bilaterally. Neuro: Alert and oriented X 3. Moves all extremities spontaneously. Psych:  Responds to questions appropriately with a normal affect.   Intake/Output Summary (Last 24 hours) at 01/21/14 1038 Last data filed at 01/21/14 0805  Gross per 24 hour  Intake    360 ml  Output   1700 ml  Net  -1340 ml    Inpatient Medications:  . ezetimibe  10 mg Oral Daily   And  . atorvastatin  20 mg Oral q1800  . budesonide-formoterol  2 puff Inhalation BID  . cholecalciferol  1,000 Units Oral Daily  . dabigatran  150 mg Oral Q12H  . diltiazem  120 mg Oral Q12H  . docusate sodium  100 mg Oral BID  . insulin aspart  0-20 Units Subcutaneous TID WC  . insulin glargine  15 Units Subcutaneous QHS  . ipratropium-albuterol  3 mL Nebulization BID  . irbesartan  75 mg Oral Daily  . pantoprazole  40 mg Oral Daily  . polyethylene glycol  17 g Oral Daily  . predniSONE  30 mg Oral  Q breakfast  . senna  1 tablet Oral Daily  . vitamin C  500 mg Oral Daily  . vitamin E  400 Units Oral Daily   Infusions:    Labs:  Recent Labs  01/20/14 0425 01/21/14 0400  NA 136* 136*  K 4.4 4.1  CL 96 95*  CO2 27 28  GLUCOSE 231* 112*  BUN 68* 63*  CREATININE 1.62* 1.66*  CALCIUM 7.8* 8.0*   No results found for this basename: AST, ALT, ALKPHOS, BILITOT, PROT, ALBUMIN,  in the last 72 hours No results found for this basename: WBC, NEUTROABS, HGB, HCT, MCV, PLT,  in the last 72 hours No results found for this basename: CKTOTAL, CKMB, TROPONINI,  in the last 72 hours No components found with this basename: POCBNP,  No results found for this basename: HGBA1C,  in the last 72 hours   Radiology/Studies:  Dg Chest Portable 1 View  01/14/2014   CLINICAL DATA:  Worsening dyspnea, history of emphysema and CHF  EXAM: PORTABLE CHEST - 1 VIEW  COMPARISON:  CT CHEST W/O CM dated 09/09/2010  FINDINGS: The lungs are well-expanded. The interstitial markings are mildly increased. The pulmonary vascularity is prominent centrally and there is mild cephalization. The cardiac silhouette is mildly enlarged. There is no pleural effusion. There is no alveolar infiltrate. The mediastinum is normal in width. There is tortuosity of the descending thoracic aorta. The observed portions of the bony thorax appear normal.  IMPRESSION: Findings are consistent with CHF superimposed upon COPD. There is no alveolar pneumonia.   Electronically Signed   By: David  Martinique   On: 01/14/2014 16:43     Assessment and Plan  1. Acute exacerbation of COPD with acute respiratory failure - per IM.   2. PAF, episode likely triggered by COPD exacerbation - maintaining NSR with PACs. Occasional irregularity looks like wandering atrial pacemaker, otherwise largely SR. Brief run of atrial tach overnight. Pt reported only taking Pradaxa once daily at home and was reeducated on importance of BID dosing. Dilt held this AM due to BP  low. 3. Acute on chronic diastolic CHF most likely exacerbated by COPD exacerbation and rapid afib - weight up 8 lbs; UOP about 1700 yesterday but breathing symptomatically improved. 4. Acute on possibly CKD (Cr 1.2 in 2011), 1.56 on adm - holding steady in 1.6 range. 5. Anemia, new from 2010 lab but 2013 references decline - 2013: EGD: candida esophagitis, hematin. Colonoscopy: multiple polyps, int/ext hemorrhoids, diverticula. Repeat recommended 3 yrs. Denies bleeding. Per IM. 6. DM type II - Actos d/c'd due to CHF. 7. HTN - stable. 8. Elevated PA pressure, question secondary to underlying lung disease. 9. Morbid obesity Body mass index is 51.14 kg/(m^2).  Will discuss med regimen for AF with MD along with diuretics given soft BP (weakness likely related to this).    Signed, Melina Copa PA-C

## 2014-01-22 ENCOUNTER — Inpatient Hospital Stay (HOSPITAL_COMMUNITY): Payer: Medicare Other

## 2014-01-22 DIAGNOSIS — I509 Heart failure, unspecified: Secondary | ICD-10-CM | POA: Diagnosis not present

## 2014-01-22 DIAGNOSIS — I5033 Acute on chronic diastolic (congestive) heart failure: Secondary | ICD-10-CM | POA: Diagnosis not present

## 2014-01-22 DIAGNOSIS — R05 Cough: Secondary | ICD-10-CM | POA: Diagnosis not present

## 2014-01-22 DIAGNOSIS — I4891 Unspecified atrial fibrillation: Secondary | ICD-10-CM | POA: Diagnosis not present

## 2014-01-22 DIAGNOSIS — J9600 Acute respiratory failure, unspecified whether with hypoxia or hypercapnia: Secondary | ICD-10-CM | POA: Diagnosis not present

## 2014-01-22 DIAGNOSIS — J449 Chronic obstructive pulmonary disease, unspecified: Secondary | ICD-10-CM | POA: Diagnosis not present

## 2014-01-22 LAB — BASIC METABOLIC PANEL
BUN: 49 mg/dL — ABNORMAL HIGH (ref 6–23)
CALCIUM: 7.9 mg/dL — AB (ref 8.4–10.5)
CO2: 29 mEq/L (ref 19–32)
Chloride: 95 mEq/L — ABNORMAL LOW (ref 96–112)
Creatinine, Ser: 1.38 mg/dL — ABNORMAL HIGH (ref 0.50–1.10)
GFR, EST AFRICAN AMERICAN: 44 mL/min — AB (ref >90)
GFR, EST NON AFRICAN AMERICAN: 38 mL/min — AB (ref >90)
Glucose, Bld: 146 mg/dL — ABNORMAL HIGH (ref 70–99)
POTASSIUM: 4.4 meq/L (ref 3.7–5.3)
Sodium: 136 mEq/L — ABNORMAL LOW (ref 137–147)

## 2014-01-22 LAB — GLUCOSE, CAPILLARY
GLUCOSE-CAPILLARY: 147 mg/dL — AB (ref 70–99)
GLUCOSE-CAPILLARY: 240 mg/dL — AB (ref 70–99)
GLUCOSE-CAPILLARY: 143 mg/dL — AB (ref 70–99)
Glucose-Capillary: 161 mg/dL — ABNORMAL HIGH (ref 70–99)

## 2014-01-22 LAB — CBC
HCT: 25.7 % — ABNORMAL LOW (ref 36.0–46.0)
HEMOGLOBIN: 8.3 g/dL — AB (ref 12.0–15.0)
MCH: 29.6 pg (ref 26.0–34.0)
MCHC: 32.3 g/dL (ref 30.0–36.0)
MCV: 91.8 fL (ref 78.0–100.0)
PLATELETS: 206 10*3/uL (ref 150–400)
RBC: 2.8 MIL/uL — AB (ref 3.87–5.11)
RDW: 16.8 % — ABNORMAL HIGH (ref 11.5–15.5)
WBC: 10.8 10*3/uL — AB (ref 4.0–10.5)

## 2014-01-22 MED ORDER — FUROSEMIDE 10 MG/ML IJ SOLN
40.0000 mg | Freq: Every day | INTRAMUSCULAR | Status: DC
Start: 2014-01-22 — End: 2014-01-22
  Administered 2014-01-22: 40 mg via INTRAVENOUS
  Filled 2014-01-22: qty 4

## 2014-01-22 MED ORDER — GUAIFENESIN-CODEINE 100-10 MG/5ML PO SOLN: 5.0000 mL | Freq: Four times a day (QID) | ORAL | Status: DC | PRN

## 2014-01-22 MED ORDER — FUROSEMIDE 80 MG PO TABS
80.0000 mg | ORAL_TABLET | Freq: Every day | ORAL | Status: DC
Start: 2014-01-22 — End: 2014-01-23
  Administered 2014-01-22 – 2014-01-23 (×2): 80 mg via ORAL
  Filled 2014-01-22 (×2): qty 1

## 2014-01-22 MED ORDER — BENZONATATE 100 MG PO CAPS
100.0000 mg | ORAL_CAPSULE | Freq: Three times a day (TID) | ORAL | Status: DC
  Administered 2014-01-22 – 2014-01-23 (×4): 100 mg via ORAL
  Filled 2014-01-22 (×6): qty 1

## 2014-01-22 NOTE — Progress Notes (Addendum)
Subjective: No CP  Cough this AM  Breathing is good  Objective: Filed Vitals:   01/21/14 2046 01/21/14 2122 01/22/14 0500 01/22/14 0700  BP: 105/50  101/33   Pulse: 80  81   Temp: 98.1 F (36.7 C)  97.9 F (36.6 C)   TempSrc: Oral  Oral   Resp: 20  20   Height:      Weight:   303 lb 2.1 oz (137.5 kg)   SpO2: 98% 96% 97% 98%   Weight change: -4 lb 2.7 oz (-1.89 kg)  Intake/Output Summary (Last 24 hours) at 01/22/14 0858 Last data filed at 01/22/14 0550  Gross per 24 hour  Intake    200 ml  Output   2300 ml  Net  -2100 ml   Total I/O since admit  NEg 5.65 L  General: Alert, awake, oriented x3, in no acute distress  Heart: Regular rate and rhythm, without murmurs, rubs, gallops.  Lungs: Clear to auscultation.  No rale=s  Rare wheeze Exemities:  2+ edema.   Neuro: Grossly intact, nonfocal.  Tele:  SR Lab Results: Results for orders placed during the hospital encounter of 01/14/14 (from the past 24 hour(s))  GLUCOSE, CAPILLARY     Status: Abnormal   Collection Time    01/21/14 12:04 PM      Result Value Ref Range   Glucose-Capillary 215 (*) 70 - 99 mg/dL  GLUCOSE, CAPILLARY     Status: Abnormal   Collection Time    01/21/14  4:56 PM      Result Value Ref Range   Glucose-Capillary 168 (*) 70 - 99 mg/dL  GLUCOSE, CAPILLARY     Status: Abnormal   Collection Time    01/21/14  8:43 PM      Result Value Ref Range   Glucose-Capillary 186 (*) 70 - 99 mg/dL   Comment 1 Notify RN    BASIC METABOLIC PANEL     Status: Abnormal   Collection Time    01/22/14  3:44 AM      Result Value Ref Range   Sodium 136 (*) 137 - 147 mEq/L   Potassium 4.4  3.7 - 5.3 mEq/L   Chloride 95 (*) 96 - 112 mEq/L   CO2 29  19 - 32 mEq/L   Glucose, Bld 146 (*) 70 - 99 mg/dL   BUN 49 (*) 6 - 23 mg/dL   Creatinine, Ser 1.38 (*) 0.50 - 1.10 mg/dL   Calcium 7.9 (*) 8.4 - 10.5 mg/dL   GFR calc non Af Amer 38 (*) >90 mL/min   GFR calc Af Amer 44 (*) >90 mL/min  CBC     Status: Abnormal   Collection Time    01/22/14  3:44 AM      Result Value Ref Range   WBC 10.8 (*) 4.0 - 10.5 K/uL   RBC 2.80 (*) 3.87 - 5.11 MIL/uL   Hemoglobin 8.3 (*) 12.0 - 15.0 g/dL   HCT 25.7 (*) 36.0 - 46.0 %   MCV 91.8  78.0 - 100.0 fL   MCH 29.6  26.0 - 34.0 pg   MCHC 32.3  30.0 - 36.0 g/dL   RDW 16.8 (*) 11.5 - 15.5 %   Platelets 206  150 - 400 K/uL  GLUCOSE, CAPILLARY     Status: Abnormal   Collection Time    01/22/14  8:50 AM      Result Value Ref Range   Glucose-Capillary 147 (*) 70 - 99 mg/dL   Comment  1 Documented in Chart     Comment 2 Notify RN      Studies/Results: @RISRSLT24 @  Medications:Reviewed   @PROBHOSP @  1.  Acute diastolic CHF  Still with evid of volume overload on exam. ("My ankles are twice the size as normal") BP is a little soft this am  Renal function is a little better actually  If BP bounces back up I would resume IV lasix.  (Had been on oral lasix  I would hold this and resume IV if in hospital)  Hold checking BNP as she is not near baseline.    2. AFIb  Remains in SR  Add back low dose dilt when BP improves.    3  COPD  Moving air well  4  CKD  Cr down to 1.38  5.  HTN  Meds on hold as bp a little low.  When improves would readd dlit first.    LOS: 8 days   Dorris Carnes 01/22/2014, 8:58 AM  As per the above note will start IV lasix this afternoon, daily. Check BMET in AM.  HWBSmith,III, MD

## 2014-01-22 NOTE — Progress Notes (Signed)
Patient ID: Paige Hardin  female  HYQ:[id number]    DOB: 1946-07-24    DOA: 01/14/2014  PCP: Stephens Shire, MD  Assessment/Plan:  1. COPD exacerbation  - patient coughing with some productive phlegm this morning, chest x-ray done, no pneumonia - Continue nebs, Symbicort, prednisone, O2  - Home O2 evaluation today  2. CHF exacerbation likely diastolic HF; echo: LVEF 95%, diastolic HF; LE edema likely CHF+venous stasis  -per cardiology; diuresing patient is having borderline BP with dizziness, Lasix was on hold -  d/c pioglitozone  - Patient started on IV diuresis today by cardiology  3. Acute respiratory failure: as above - Home O2 evaluation prior to discharge   4. A. fib with RVR:  - cont Cardizem decreased to 120 mg daily,  - Continue pradaxa  5. Hyperlipidemia: Continue with Vytorin   6. DM HA1C-6.1  -glucose uncontrolled with steroids; started lantus 15 units with sliding scale  -d/c pioglitozone with CHF;  - Taper prednisone   DVT Prophylaxis:pradaxa  Code Status: DNR  Family Communication:  Disposition:  inpatient   Consultants:  Cardiology   Procedures:  2-D echo  Antibiotics:  none  Subjective:  feeling somewhat better with shortness of breath however coughing with productive phlegm, no fevers or chills   Objective: Weight change: -1.89 kg (-4 lb 2.7 oz)  Intake/Output Summary (Last 24 hours) at 01/22/14 1448 Last data filed at 01/22/14 1300  Gross per 24 hour  Intake    720 ml  Output   2300 ml  Net  -1580 ml   Blood pressure 118/43, pulse 85, temperature 98.1 F (36.7 C), temperature source Oral, resp. rate 18, height 5' 5"  (1.651 m), weight 137.5 kg (303 lb 2.1 oz), SpO2 98.00%.  Physical Exam: General: Alert and awake, oriented x3 CVS: S1-S2 clear, no murmur rubs or gallops Chest: clear to auscultation bilaterally, no wheezing, rales or rhonchi Abdomen: soft nontender, nondistended, normal bowel sounds  Extremities: no cyanosis,  clubbing, trace edema noted bilaterally   Lab Results: Basic Metabolic Panel:  Recent Labs Lab 01/21/14 0400 01/22/14 0344  NA 136* 136*  K 4.1 4.4  CL 95* 95*  CO2 28 29  GLUCOSE 112* 146*  BUN 63* 49*  CREATININE 1.66* 1.38*  CALCIUM 8.0* 7.9*   Liver Function Tests: No results found for this basename: AST, ALT, ALKPHOS, BILITOT, PROT, ALBUMIN,  in the last 168 hours No results found for this basename: LIPASE, AMYLASE,  in the last 168 hours No results found for this basename: AMMONIA,  in the last 168 hours CBC:  Recent Labs Lab 01/22/14 0344  WBC 10.8*  HGB 8.3*  HCT 25.7*  MCV 91.8  PLT 206   Cardiac Enzymes: No results found for this basename: CKTOTAL, CKMB, CKMBINDEX, TROPONINI,  in the last 168 hours BNP: No components found with this basename: POCBNP,  CBG:  Recent Labs Lab 01/21/14 1204 01/21/14 1656 01/21/14 2043 01/22/14 0850 01/22/14 1126  GLUCAP 215* 168* 186* 147* 161*     Micro Results: Recent Results (from the past 240 hour(s))  RESPIRATORY VIRUS PANEL     Status: None   Collection Time    01/15/14  1:14 PM      Result Value Ref Range Status   Source - RVPAN NASAL SWAB   Corrected   Comment: CORRECTED ON 02/20 AT 2229: PREVIOUSLY REPORTED AS NASAL SWAB   Respiratory Syncytial Virus A NOT DETECTED   Final   Respiratory Syncytial Virus B NOT DETECTED  Final   Influenza A NOT DETECTED   Final   Influenza B NOT DETECTED   Final   Parainfluenza 1 NOT DETECTED   Final   Parainfluenza 2 NOT DETECTED   Final   Parainfluenza 3 NOT DETECTED   Final   Metapneumovirus NOT DETECTED   Final   Rhinovirus NOT DETECTED   Final   Adenovirus NOT DETECTED   Final   Influenza A H1 NOT DETECTED   Final   Influenza A H3 NOT DETECTED   Final   Comment: (NOTE)           Normal Reference Range for each Analyte: NOT DETECTED     Testing performed using the Luminex xTAG Respiratory Viral Panel test     kit.     This test was developed and its  performance characteristics determined     by [facility]. It has not been cleared or approved by the [place]     Food and Drug Administration. This test is used for clinical purposes.     It should not be regarded as investigational or for research. This     laboratory is certified under the [place] (CLIA) as qualified to perform high complexity     clinical laboratory testing.     Performed at [facility]    Studies/Results: Dg Chest Portable 1 View  01/14/2014   CLINICAL DATA:  Worsening dyspnea, history of emphysema and CHF  EXAM: PORTABLE CHEST - 1 VIEW  COMPARISON:  CT CHEST W/O CM dated 09/09/2010  FINDINGS: The lungs are well-expanded. The interstitial markings are mildly increased. The pulmonary vascularity is prominent centrally and there is mild cephalization. The cardiac silhouette is mildly enlarged. There is no pleural effusion. There is no alveolar infiltrate. The mediastinum is normal in width. There is tortuosity of the descending thoracic aorta. The observed portions of the bony thorax appear normal.  IMPRESSION: Findings are consistent with CHF superimposed upon COPD. There is no alveolar pneumonia.   Electronically Signed   By: David  Martinique   On: 01/14/2014 16:43    Medications: Scheduled Meds: . ezetimibe  10 mg Oral Daily   And  . atorvastatin  20 mg Oral q1800  . benzonatate  100 mg Oral TID  . budesonide-formoterol  2 puff Inhalation BID  . cholecalciferol  1,000 Units Oral Daily  . dabigatran  150 mg Oral Q12H  . diltiazem  120 mg Oral Daily  . docusate sodium  100 mg Oral BID  . furosemide  40 mg Intravenous Daily  . insulin aspart  0-20 Units Subcutaneous TID WC  . insulin glargine  15 Units Subcutaneous QHS  . ipratropium-albuterol  3 mL Nebulization BID  . irbesartan  75 mg Oral Daily  . pantoprazole  40 mg Oral Daily  . polyethylene glycol  17 g Oral Daily  . predniSONE  20 mg Oral Q breakfast    . senna  1 tablet Oral Daily  . vitamin C  500 mg Oral Daily  . vitamin E  400 Units Oral Daily      LOS: 8 days   RAI,RIPUDEEP M.D. [facility] 01/22/2014, 2:48 PM Pager: [phone]  If 7PM-7AM, please contact night-coverage [email or web address] Password [email or web address]

## 2014-01-23 DIAGNOSIS — I6529 Occlusion and stenosis of unspecified carotid artery: Secondary | ICD-10-CM | POA: Diagnosis present

## 2014-01-23 DIAGNOSIS — J449 Chronic obstructive pulmonary disease, unspecified: Secondary | ICD-10-CM | POA: Diagnosis not present

## 2014-01-23 DIAGNOSIS — I5043 Acute on chronic combined systolic (congestive) and diastolic (congestive) heart failure: Secondary | ICD-10-CM | POA: Diagnosis not present

## 2014-01-23 DIAGNOSIS — I5032 Chronic diastolic (congestive) heart failure: Secondary | ICD-10-CM | POA: Diagnosis present

## 2014-01-23 DIAGNOSIS — D649 Anemia, unspecified: Secondary | ICD-10-CM | POA: Diagnosis not present

## 2014-01-23 DIAGNOSIS — D689 Coagulation defect, unspecified: Secondary | ICD-10-CM | POA: Diagnosis not present

## 2014-01-23 DIAGNOSIS — J4489 Other specified chronic obstructive pulmonary disease: Secondary | ICD-10-CM | POA: Diagnosis not present

## 2014-01-23 DIAGNOSIS — G4733 Obstructive sleep apnea (adult) (pediatric): Secondary | ICD-10-CM | POA: Diagnosis present

## 2014-01-23 DIAGNOSIS — E876 Hypokalemia: Secondary | ICD-10-CM | POA: Diagnosis not present

## 2014-01-23 DIAGNOSIS — D509 Iron deficiency anemia, unspecified: Secondary | ICD-10-CM | POA: Diagnosis present

## 2014-01-23 DIAGNOSIS — I10 Essential (primary) hypertension: Secondary | ICD-10-CM | POA: Diagnosis not present

## 2014-01-23 DIAGNOSIS — Z5189 Encounter for other specified aftercare: Secondary | ICD-10-CM | POA: Diagnosis not present

## 2014-01-23 DIAGNOSIS — N183 Chronic kidney disease, stage 3 unspecified: Secondary | ICD-10-CM | POA: Diagnosis not present

## 2014-01-23 DIAGNOSIS — G8929 Other chronic pain: Secondary | ICD-10-CM | POA: Diagnosis present

## 2014-01-23 DIAGNOSIS — E785 Hyperlipidemia, unspecified: Secondary | ICD-10-CM | POA: Diagnosis not present

## 2014-01-23 DIAGNOSIS — M545 Low back pain, unspecified: Secondary | ICD-10-CM | POA: Diagnosis present

## 2014-01-23 DIAGNOSIS — IMO0001 Reserved for inherently not codable concepts without codable children: Secondary | ICD-10-CM | POA: Diagnosis not present

## 2014-01-23 DIAGNOSIS — E039 Hypothyroidism, unspecified: Secondary | ICD-10-CM | POA: Diagnosis not present

## 2014-01-23 DIAGNOSIS — M6281 Muscle weakness (generalized): Secondary | ICD-10-CM | POA: Diagnosis not present

## 2014-01-23 DIAGNOSIS — I5033 Acute on chronic diastolic (congestive) heart failure: Secondary | ICD-10-CM | POA: Diagnosis not present

## 2014-01-23 DIAGNOSIS — Z66 Do not resuscitate: Secondary | ICD-10-CM | POA: Diagnosis present

## 2014-01-23 DIAGNOSIS — E119 Type 2 diabetes mellitus without complications: Secondary | ICD-10-CM | POA: Diagnosis present

## 2014-01-23 DIAGNOSIS — J9600 Acute respiratory failure, unspecified whether with hypoxia or hypercapnia: Secondary | ICD-10-CM | POA: Diagnosis not present

## 2014-01-23 DIAGNOSIS — E559 Vitamin D deficiency, unspecified: Secondary | ICD-10-CM | POA: Diagnosis not present

## 2014-01-23 DIAGNOSIS — Z6841 Body Mass Index (BMI) 40.0 and over, adult: Secondary | ICD-10-CM | POA: Diagnosis not present

## 2014-01-23 DIAGNOSIS — R279 Unspecified lack of coordination: Secondary | ICD-10-CM | POA: Diagnosis not present

## 2014-01-23 DIAGNOSIS — I499 Cardiac arrhythmia, unspecified: Secondary | ICD-10-CM | POA: Diagnosis not present

## 2014-01-23 DIAGNOSIS — I4891 Unspecified atrial fibrillation: Secondary | ICD-10-CM | POA: Diagnosis not present

## 2014-01-23 DIAGNOSIS — I2789 Other specified pulmonary heart diseases: Secondary | ICD-10-CM | POA: Diagnosis present

## 2014-01-23 DIAGNOSIS — I129 Hypertensive chronic kidney disease with stage 1 through stage 4 chronic kidney disease, or unspecified chronic kidney disease: Secondary | ICD-10-CM | POA: Diagnosis present

## 2014-01-23 DIAGNOSIS — I509 Heart failure, unspecified: Secondary | ICD-10-CM | POA: Diagnosis not present

## 2014-01-23 DIAGNOSIS — Z7901 Long term (current) use of anticoagulants: Secondary | ICD-10-CM | POA: Diagnosis not present

## 2014-01-23 DIAGNOSIS — R262 Difficulty in walking, not elsewhere classified: Secondary | ICD-10-CM | POA: Diagnosis not present

## 2014-01-23 DIAGNOSIS — K5900 Constipation, unspecified: Secondary | ICD-10-CM | POA: Diagnosis not present

## 2014-01-23 DIAGNOSIS — Z79899 Other long term (current) drug therapy: Secondary | ICD-10-CM | POA: Diagnosis not present

## 2014-01-23 DIAGNOSIS — K219 Gastro-esophageal reflux disease without esophagitis: Secondary | ICD-10-CM | POA: Diagnosis present

## 2014-01-23 DIAGNOSIS — J438 Other emphysema: Secondary | ICD-10-CM | POA: Diagnosis not present

## 2014-01-23 DIAGNOSIS — R918 Other nonspecific abnormal finding of lung field: Secondary | ICD-10-CM | POA: Diagnosis present

## 2014-01-23 DIAGNOSIS — R0989 Other specified symptoms and signs involving the circulatory and respiratory systems: Secondary | ICD-10-CM | POA: Diagnosis not present

## 2014-01-23 DIAGNOSIS — K5730 Diverticulosis of large intestine without perforation or abscess without bleeding: Secondary | ICD-10-CM | POA: Diagnosis present

## 2014-01-23 LAB — BASIC METABOLIC PANEL
BUN: 33 mg/dL — AB (ref 6–23)
CO2: 32 meq/L (ref 19–32)
CREATININE: 1.08 mg/dL (ref 0.50–1.10)
Calcium: 8.4 mg/dL (ref 8.4–10.5)
Chloride: 98 mEq/L (ref 96–112)
GFR calc non Af Amer: 52 mL/min — ABNORMAL LOW (ref >90)
GFR, EST AFRICAN AMERICAN: 60 mL/min — AB (ref >90)
Glucose, Bld: 91 mg/dL (ref 70–99)
Potassium: 4 mEq/L (ref 3.7–5.3)
Sodium: 140 mEq/L (ref 137–147)

## 2014-01-23 LAB — GLUCOSE, CAPILLARY: GLUCOSE-CAPILLARY: 96 mg/dL (ref 70–99)

## 2014-01-23 MED ORDER — DSS 100 MG PO CAPS: 100.0000 mg | ORAL_CAPSULE | Freq: Two times a day (BID) | ORAL | Status: DC

## 2014-01-23 MED ORDER — DABIGATRAN ETEXILATE MESYLATE 150 MG PO CAPS: 150.0000 mg | ORAL_CAPSULE | Freq: Two times a day (BID) | ORAL | Status: DC

## 2014-01-23 MED ORDER — POLYETHYLENE GLYCOL 3350 17 G PO PACK: 17.0000 g | PACK | Freq: Every day | ORAL | Status: DC | PRN

## 2014-01-23 MED ORDER — BENZONATATE 100 MG PO CAPS: 100.0000 mg | ORAL_CAPSULE | Freq: Three times a day (TID) | ORAL | Status: DC

## 2014-01-23 MED ORDER — PANTOPRAZOLE SODIUM 40 MG PO TBEC: 40.0000 mg | DELAYED_RELEASE_TABLET | Freq: Every day | ORAL | Status: DC

## 2014-01-23 MED ORDER — PREDNISONE 10 MG PO TABS: 10.0000 mg | ORAL_TABLET | Freq: Every day | ORAL | Status: DC

## 2014-01-23 MED ORDER — FUROSEMIDE 40 MG PO TABS: 80.0000 mg | ORAL_TABLET | Freq: Every day | ORAL | Status: DC

## 2014-01-23 MED ORDER — GUAIFENESIN-CODEINE 100-10 MG/5ML PO SOLN: 5.0000 mL | Freq: Four times a day (QID) | ORAL | Status: DC | PRN

## 2014-01-23 MED ORDER — BISACODYL 10 MG RE SUPP: 10.0000 mg | Freq: Every day | RECTAL | Status: DC | PRN

## 2014-01-23 MED ORDER — ASCORBIC ACID 500 MG PO TABS: 500.0000 mg | ORAL_TABLET | Freq: Every day | ORAL | Status: DC

## 2014-01-23 MED ORDER — IPRATROPIUM-ALBUTEROL 0.5-2.5 (3) MG/3ML IN SOLN: 3.0000 mL | Freq: Two times a day (BID) | RESPIRATORY_TRACT | Status: DC

## 2014-01-23 NOTE — Progress Notes (Signed)
CSW (Clinical Education officer, museum) prepared pt dc packet and placed with shadow chart. CSW arranged non-emergent ambulance transport. Pt, pt nurse, and facility informed. CSW signing off.   [place], [place]

## 2014-01-23 NOTE — Discharge Summary (Signed)
Physician Discharge Summary  Patient ID: Paige Hardin MRN: [id number] DOB/AGE: 1946/10/29 68 y.o.  Admit date: 01/14/2014 Discharge date: 01/23/2014  Primary Care Physician:  Paige Shire, MD  Discharge Diagnoses:    . Acute respiratory failure- resolved  . HYPERLIPIDEMIA-MIXED . COPD with emphysema improving  . PAF (paroxysmal atrial fibrillation) . Acute on chronic diastolic congestive heart failure significantly improved  . HTN (hypertension) . Pulmonary HTN- pa 53 mmHg Echo 01/15/14 . Morbid obesity- BMI 50 . Poorly controlled diabetes mellitus . Chronic renal disease, stage III  Consults:  Cardiology, Dr. Tamala Hardin   Recommendations for Outpatient Follow-up:  Please note that Diovan is currently held due to patient's borderline BP however if the BP increased please restart diovan/valsartan 80 mg daily  Allergies:   Allergies  Allergen Reactions  . Ace Inhibitors     REACTION: cough  . Clindamycin     REACTION: rash     Discharge Medications:   Medication List    STOP taking these medications       pioglitazone 45 MG tablet  Commonly known as:  ACTOS     valsartan 80 MG tablet  Commonly known as:  DIOVAN      TAKE these medications       acetaminophen 325 MG tablet  Commonly known as:  TYLENOL  Take 325 mg by mouth daily as needed for mild pain.     albuterol 108 (90 BASE) MCG/ACT inhaler  Commonly known as:  PROAIR HFA  Inhale 2 puffs into the lungs every 6 (six) hours as needed for shortness of breath.     benzonatate 100 MG capsule  Commonly known as:  TESSALON  Take 1 capsule (100 mg total) by mouth 3 (three) times daily.     bisacodyl 10 MG suppository  Commonly known as:  DULCOLAX  Place 1 suppository (10 mg total) rectally daily as needed for moderate constipation.     budesonide-formoterol 80-4.5 MCG/ACT inhaler  Commonly known as:  SYMBICORT  Inhale 2 puffs into the lungs 2 (two) times daily.     cholecalciferol 1000 UNITS tablet   Commonly known as:  VITAMIN D  Take 1,000 Units by mouth daily.     dabigatran 150 MG Caps capsule  Commonly known as:  PRADAXA  Take 1 capsule (150 mg total) by mouth 2 (two) times daily.     diltiazem 120 MG 24 hr capsule  Commonly known as:  CARDIZEM CD  Take 120 mg by mouth daily.     DSS 100 MG Caps  Take 100 mg by mouth 2 (two) times daily.     ezetimibe-simvastatin 10-40 MG per tablet  Commonly known as:  VYTORIN  Take 1 tablet by mouth at bedtime.     furosemide 40 MG tablet  Commonly known as:  LASIX  Take 2 tablets (80 mg total) by mouth daily.     glyBURIDE micronized 3 MG tablet  Commonly known as:  GLYNASE  Take 3 mg by mouth daily.     guaiFENesin-codeine 100-10 MG/5ML syrup  Take 5 mLs by mouth every 6 (six) hours as needed for cough.     ipratropium-albuterol 0.5-2.5 (3) MG/3ML Soln  Commonly known as:  DUONEB  Take 3 mLs by nebulization 2 (two) times daily.     MULTIVITAMIN/IRON PO  Take 1 tablet by mouth daily.     OVER THE COUNTER MEDICATION  Take 1 tablet by mouth daily. Calcium 333 mg + Zinc 5 mg + Magnesium 133  mg     pantoprazole 40 MG tablet  Commonly known as:  PROTONIX  Take 1 tablet (40 mg total) by mouth daily.     polyethylene glycol packet  Commonly known as:  MIRALAX / GLYCOLAX  Take 17 g by mouth daily as needed for moderate constipation.     potassium chloride SA 20 MEQ tablet  Commonly known as:  K-DUR,KLOR-CON  Take 40 mEq by mouth daily.     predniSONE 10 MG tablet  Commonly known as:  DELTASONE  Take 1 tablet (10 mg total) by mouth daily with breakfast. X 3days, then off  Start taking on:  01/24/2014     tiotropium 18 MCG inhalation capsule  Commonly known as:  SPIRIVA  Place 18 mcg into inhaler and inhale daily.     vitamin B-12 500 MCG tablet  Commonly known as:  CYANOCOBALAMIN  Take 500 mcg by mouth daily.     Vitamin C 500 MG Caps  Take 1 tablet by mouth daily.     vitamin E 400 UNIT capsule  Take 400 Units  by mouth daily.         Brief H and P: For complete details please refer to admission H and P, but in brief Paige Hardin is a 68 y.o. female, with known history of COPD, diastolic CHF, active fibrillation on pradaxa , not using any home oxygen, he presents with complaints of shortness of breath, cough, nonproductive, patient was noticed to be hypoxic in ED, requiring 3 L nasal cannula, had significant wheezing, improved after receiving nebulizer treatment, her chest x-ray showing a vascular cause congestion, as well patient complains of worsening lower extremity edema, she is known to have history of diastolic CHF, the patient was noticed to be in A. fib with RVR, given IV Cardizem 10 mg, then started on Cardizem drip, currently Cardizem drip stopped as her heart rate in the low 100, denies any chest pain, fever chills, coffee-ground emesis, hematemesis.     Hospital Course:   1. COPD exacerbation significantly improved patient was started on scheduled nebulizer breathing treatments, Symbicort, prednisone and O2 supplementation. Home O2 evaluation was done and patient did not qualify for home O2. Her endurance is significantly limited and she will benefit from skilled nursing facility to continue with rehabilitation. She will continue prednisone taper to finish 10 mg next 3 days then off. Chest x-ray was repeated on 01/22/2014 and showed no pneumonia.  2. CHF exacerbation likely diastolic HF; echo: LVEF [id number], diastolic HF; LE edema likely CHF+venous stasis cardiology was consulted and patient was placed on IV diuresis with Lasix. She did well however due to borderline BP medicine is Lasix was placed on hold. She has been restarted on oral Lasix and so far has been tolerating. Pioglitozone has been discontinued due to congestive heart failure.  3. Acute respiratory failure: Resolved, patient's O2 sat remained at 95% on room air with ambulation  4. A. fib with RVR:  cont Cardizem 120 mg daily,  Continue pradaxa   5. Hyperlipidemia: Continue with Vytorin   6. DM HA1C-6.1  CBGs were uncontrolled with steroids; patient was started on Lantus while inpatient however since her prednisone is going to be discontinued with taper, patient will likely benefit to continue glyburide and may initiate sliding scale insulin at the skilled nursing facility.  Pioglitozone has been discontinued due to CHF.      Day of Discharge BP 114/48  Pulse 93  Temp(Src) 98 F (36.7 C) (Oral)  Resp  20  Ht 5\' 5"  (1.651 m)  Wt 134.718 kg (297 lb)  BMI 49.42 kg/m2  SpO2 94%  Physical Exam: General: Alert and awake oriented x3 not in any acute distress. CVS: S1-S2 clear no murmur rubs or gallops Chest: clear to auscultation bilaterally, no wheezing rales or rhonchi Abdomen: soft nontender, nondistended, normal bowel sounds Extremities: no cyanosis, clubbing, 1+ edema noted bilaterally Neuro: Cranial nerves II-XII intact, no focal neurological deficits   The results of significant diagnostics from this hospitalization (including imaging, microbiology, ancillary and laboratory) are listed below for reference.    LAB RESULTS: Basic Metabolic Panel:  Recent Labs Lab 01/22/14 0344 01/23/14 0642  NA 136* 140  K 4.4 4.0  CL 95* 98  CO2 29 32  GLUCOSE 146* 91  BUN 49* 33*  CREATININE 1.38* 1.08  CALCIUM 7.9* 8.4   Liver Function Tests: No results found for this basename: AST, ALT, ALKPHOS, BILITOT, PROT, ALBUMIN,  in the last 168 hours No results found for this basename: LIPASE, AMYLASE,  in the last 168 hours No results found for this basename: AMMONIA,  in the last 168 hours CBC:  Recent Labs Lab 01/22/14 0344  WBC 10.8*  HGB 8.3*  HCT 25.7*  MCV 91.8  PLT 206   Cardiac Enzymes: No results found for this basename: CKTOTAL, CKMB, CKMBINDEX, TROPONINI,  in the last 168 hours BNP: No components found with this basename: POCBNP,  CBG:  Recent Labs Lab 01/22/14 2056 01/23/14 0751   GLUCAP 143* 96    Significant Diagnostic Studies:  Dg Chest Portable 1 View  01/14/2014   CLINICAL DATA:  Worsening dyspnea, history of emphysema and CHF  EXAM: PORTABLE CHEST - 1 VIEW  COMPARISON:  CT CHEST W/O CM dated 09/09/2010  FINDINGS: The lungs are well-expanded. The interstitial markings are mildly increased. The pulmonary vascularity is prominent centrally and there is mild cephalization. The cardiac silhouette is mildly enlarged. There is no pleural effusion. There is no alveolar infiltrate. The mediastinum is normal in width. There is tortuosity of the descending thoracic aorta. The observed portions of the bony thorax appear normal.  IMPRESSION: Findings are consistent with CHF superimposed upon COPD. There is no alveolar pneumonia.   Electronically Signed   By: David  Martinique   On: 01/14/2014 16:43    2D ECHO: Study Conclusions  - Left ventricle: The cavity size was mildly dilated. Wall thickness was increased in a pattern of mild LVH. The estimated ejection fraction was 60%. Wall motion was normal; there were no regional wall motion abnormalities. Findings consistent with left ventricular diastolic dysfunction. Doppler parameters are consistent with high ventricular filling pressure. - Aortic valve: Sclerosis without stenosis. No significant regurgitation. - Mitral valve: Mildly calcified annulus. Mildly thickened leaflets . Mild regurgitation. - Left atrium: The atrium was mildly to moderately dilated. - Right atrium: The atrium was mildly dilated. - Pulmonary arteries: PA peak pressure: 48mm Hg (S). - Inferior vena cava: The vessel was dilated; the respirophasic diameter changes were blunted (< 50%); findings are consistent with elevated central venous pressure.    Disposition and Follow-up: Discharge Orders   Future Appointments Provider Department Dept Phone   04/07/2014 1:45 PM Kathee Delton, MD [facility] [phone]   Future Orders Complete By  Expires   (HEART FAILURE PATIENTS) Call MD:  Anytime you have any of the following symptoms: 1) 3 pound weight gain in 24 hours or 5 pounds in 1 week 2) shortness of breath, with or without  a dry hacking cough 3) swelling in the hands, feet or stomach 4) if you have to sleep on extra pillows at night in order to breathe.  As directed    Diet Carb Modified  As directed    Increase activity slowly  As directed        DISPOSITION: Skilled nursing facility  DIET: Carb modified   DISCHARGE FOLLOW-UP Follow-up Information   Follow up with BURNETT,BRENT A, MD. Schedule an appointment as soon as possible for a visit in 10 days. (for hospital follow-up)    Specialty:  Family Medicine   Contact information:   [address] [place] [address] [phone]       Follow up with Loralie Champagne, MD. (Office will call you for your followup appointment. Call office if you have not heard back in 3 days.)    Specialty:  Cardiology   Contact information:   [address] [place] [address] [phone]       Time spent on Discharge: 45 mins  Signed:   Ikea Demicco M.D. [facility] 01/23/2014, 2:22 PM Pager: [phone]

## 2014-01-23 NOTE — Progress Notes (Signed)
Physical Therapy Treatment Patient Details Name: ELVENIA GODDEN MRN: [id number] DOB: 05-24-1946 Today's Date: 01/23/2014 Time: 8416-6063 PT Time Calculation (min): 24 min  PT Assessment / Plan / Recommendation  History of Present Illness 68 y.o. female admitted to [facility] on 01/14/14 with history of COPD, diastolic CHF, active fibrillation on pradaxa , not using any home oxygen, he presents with complaints of shortness of breath, cough, nonproductive, patient was noticed to be hypoxic in ED, requiring 3 L nasal cannula, had significant wheezing, improved after receiving nebulizer treatment, her chest x-ray showing a vascular cause congestion, as well patient complains of worsening lower extremity edema, she is known to have history of diastolic CHF, the patient was noticed to be in A. fib with RVR.     PT Comments   *Pt ambulated 85' with with RW, distance limited by reported BLE weakness. SaO2 95% on RA with walking. Endurance is significantly limited. She would benefit from ST-SNF for rehab. *  Follow Up Recommendations  SNF     Does the patient have the potential to tolerate intense rehabilitation     Barriers to Discharge        Equipment Recommendations  Rolling walker with 5" wheels;Other (comment) (bariatric)    Recommendations for Other Services    Frequency Min 2X/week   Progress towards PT Goals Progress towards PT goals: Progressing toward goals  Plan Current plan remains appropriate    Precautions / Restrictions Precautions Precautions: Fall Restrictions Weight Bearing Restrictions: No   Pertinent Vitals/Pain *Pt reports pain with hip flexion in B thighs, RN aware, premedicated SaO2 95% on RA with walking  Pt reported dizziness initially in standing, which resolved after seated rest. Upon 2nd trial of standing pt reported no dizziness.  BP 118/47 seated, 121/52 standing -taken in R forearm**    Mobility  Transfers Overall transfer level: Modified independent Equipment  used: Rolling walker (2 wheeled) Transfers: Sit to/from Stand Sit to Stand: Modified independent (Device/Increase time) General transfer comment: mod I using armrests on recliner chair to control transitions.  Per pt she has been going short distances to bed to [facility] and back to recliner chair today on her own.   Ambulation/Gait Ambulation/Gait assistance: Min guard Ambulation Distance (Feet): 42 Feet Assistive device: Rolling walker (2 wheeled) Gait Pattern/deviations: Step-through pattern;Trunk flexed Gait velocity: decreased General Gait Details: Distance limited by BLE weakness per pt report. No buckling noted. SaO2 95% on RA with walking, dyspnea 2/4.    Exercises General Exercises - Upper Extremity Shoulder Flexion: AROM;Both;10 reps;Seated General Exercises - Lower Extremity Ankle Circles/Pumps: AROM;Both;10 reps;Seated Long Arc Quad: AROM;Left;10 reps;Seated Mini-Sqauts: AROM;Both;10 reps;Standing   PT Diagnosis:    PT Problem List:   PT Treatment Interventions:     PT Goals (current goals can now be found in the care plan section) Acute Rehab PT Goals Patient Stated Goal: to get better and stronger.  To maybe move to an assisted living apartment after rehab.   PT Goal Formulation: With patient Time For Goal Achievement: 01/30/14 Potential to Achieve Goals: Good  Visit Information  Last PT Received On: 01/23/14 Assistance Needed: +1 History of Present Illness: 68 y.o. female admitted to [facility] on 01/14/14 with history of COPD, diastolic CHF, active fibrillation on pradaxa , not using any home oxygen, he presents with complaints of shortness of breath, cough, nonproductive, patient was noticed to be hypoxic in ED, requiring 3 L nasal cannula, had significant wheezing, improved after receiving nebulizer treatment, her chest x-ray showing a vascular  cause congestion, as well patient complains of worsening lower extremity edema, she is known to have history of diastolic CHF, the  patient was noticed to be in A. fib with RVR.      Subjective Data  Patient Stated Goal: to get better and stronger.  To maybe move to an assisted living apartment after rehab.     Cognition  Cognition Arousal/Alertness: Awake/alert Behavior During Therapy: WFL for tasks assessed/performed (much better spirits today) Overall Cognitive Status: Within Functional Limits for tasks assessed    Balance     End of Session PT - End of Session Activity Tolerance: Patient limited by fatigue;Patient limited by pain (limited by DOE) Patient left: in chair;with call bell/phone within reach Nurse Communication: Mobility status   GP     Blondell Reveal Kistler 01/23/2014, 11:49 AM [phone]

## 2014-01-23 NOTE — Progress Notes (Signed)
       Patient Name: Paige Hardin Date of Encounter: 01/23/2014    SUBJECTIVE: She feels better.  TELEMETRY:  NSR with controlled rate Filed Vitals:   01/22/14 1404 01/22/14 2024 01/22/14 2132 01/23/14 0522  BP: 118/43  111/50 104/52  Pulse: 85  79 85  Temp: 98.1 F (36.7 C)  97.8 F (36.6 C) 98 F (36.7 C)  TempSrc: Oral  Oral Oral  Resp: 18  20 20   Height:      Weight:    297 lb (134.718 kg)  SpO2: 98% 99% 100% 97%    Intake/Output Summary (Last 24 hours) at 01/23/14 0845 Last data filed at 01/23/14 0539  Gross per 24 hour  Intake    870 ml  Output   2325 ml  Net  -1455 ml   Net negative 7.1 liter since admission  LABS: Basic Metabolic Panel:  Recent Labs  01/22/14 0344 01/23/14 0642  NA 136* 140  K 4.4 4.0  CL 95* 98  CO2 29 32  GLUCOSE 146* 91  BUN 49* 33*  CREATININE 1.38* 1.08  CALCIUM 7.9* 8.4   CBC:  Recent Labs  01/22/14 0344  WBC 10.8*  HGB 8.3*  HCT 25.7*  MCV 91.8  PLT 206   Radiology/Studies:  No new data  Physical Exam: Blood pressure 104/52, pulse 85, temperature 98 F (36.7 C), temperature source Oral, resp. rate 20, height 5\' 5"  (1.651 m), weight 297 lb (134.718 kg), SpO2 97.00%. Weight change: -6 lb 2.1 oz (-2.782 kg)  Chest clear Irregularly irregular rhythm  ASSESSMENT:  1. A/C diastolic HF much improved. 2. A fib with controlled rate  Plan:  1. Continue current PO regimen.  Demetrios Isaacs 01/23/2014, 8:45 AM

## 2014-01-24 DIAGNOSIS — N183 Chronic kidney disease, stage 3 unspecified: Secondary | ICD-10-CM | POA: Diagnosis not present

## 2014-01-24 DIAGNOSIS — I5043 Acute on chronic combined systolic (congestive) and diastolic (congestive) heart failure: Secondary | ICD-10-CM | POA: Diagnosis not present

## 2014-01-24 DIAGNOSIS — J9600 Acute respiratory failure, unspecified whether with hypoxia or hypercapnia: Secondary | ICD-10-CM | POA: Diagnosis not present

## 2014-01-24 DIAGNOSIS — J449 Chronic obstructive pulmonary disease, unspecified: Secondary | ICD-10-CM | POA: Diagnosis not present

## 2014-01-26 ENCOUNTER — Emergency Department (HOSPITAL_COMMUNITY): Payer: Medicare Other

## 2014-01-26 ENCOUNTER — Encounter (HOSPITAL_COMMUNITY): Payer: Self-pay | Admitting: Emergency Medicine

## 2014-01-26 ENCOUNTER — Inpatient Hospital Stay (HOSPITAL_COMMUNITY)
Admission: EM | Admit: 2014-01-26 | Discharge: 2014-01-29 | DRG: 308 | Disposition: A | Discharge status: Skilled Nursing Facility | Payer: Medicare Other | Attending: Family Medicine | Admitting: Family Medicine

## 2014-01-26 DIAGNOSIS — R0989 Other specified symptoms and signs involving the circulatory and respiratory systems: Secondary | ICD-10-CM | POA: Diagnosis present

## 2014-01-26 DIAGNOSIS — Z888 Allergy status to other drugs, medicaments and biological substances status: Secondary | ICD-10-CM

## 2014-01-26 DIAGNOSIS — J4489 Other specified chronic obstructive pulmonary disease: Secondary | ICD-10-CM | POA: Diagnosis present

## 2014-01-26 DIAGNOSIS — Z6841 Body Mass Index (BMI) 40.0 and over, adult: Secondary | ICD-10-CM

## 2014-01-26 DIAGNOSIS — I272 Pulmonary hypertension, unspecified: Secondary | ICD-10-CM | POA: Diagnosis present

## 2014-01-26 DIAGNOSIS — D649 Anemia, unspecified: Secondary | ICD-10-CM | POA: Diagnosis not present

## 2014-01-26 DIAGNOSIS — M545 Low back pain, unspecified: Secondary | ICD-10-CM | POA: Diagnosis present

## 2014-01-26 DIAGNOSIS — E876 Hypokalemia: Secondary | ICD-10-CM | POA: Diagnosis not present

## 2014-01-26 DIAGNOSIS — Z825 Family history of asthma and other chronic lower respiratory diseases: Secondary | ICD-10-CM

## 2014-01-26 DIAGNOSIS — R279 Unspecified lack of coordination: Secondary | ICD-10-CM | POA: Diagnosis not present

## 2014-01-26 DIAGNOSIS — N183 Chronic kidney disease, stage 3 unspecified: Secondary | ICD-10-CM | POA: Diagnosis not present

## 2014-01-26 DIAGNOSIS — I2789 Other specified pulmonary heart diseases: Secondary | ICD-10-CM | POA: Diagnosis present

## 2014-01-26 DIAGNOSIS — J438 Other emphysema: Secondary | ICD-10-CM

## 2014-01-26 DIAGNOSIS — I5032 Chronic diastolic (congestive) heart failure: Secondary | ICD-10-CM | POA: Diagnosis present

## 2014-01-26 DIAGNOSIS — R918 Other nonspecific abnormal finding of lung field: Secondary | ICD-10-CM | POA: Diagnosis present

## 2014-01-26 DIAGNOSIS — I6529 Occlusion and stenosis of unspecified carotid artery: Secondary | ICD-10-CM | POA: Diagnosis present

## 2014-01-26 DIAGNOSIS — J449 Chronic obstructive pulmonary disease, unspecified: Secondary | ICD-10-CM | POA: Diagnosis not present

## 2014-01-26 DIAGNOSIS — Z87891 Personal history of nicotine dependence: Secondary | ICD-10-CM

## 2014-01-26 DIAGNOSIS — I509 Heart failure, unspecified: Secondary | ICD-10-CM | POA: Diagnosis not present

## 2014-01-26 DIAGNOSIS — D509 Iron deficiency anemia, unspecified: Secondary | ICD-10-CM | POA: Diagnosis present

## 2014-01-26 DIAGNOSIS — K5900 Constipation, unspecified: Secondary | ICD-10-CM | POA: Diagnosis not present

## 2014-01-26 DIAGNOSIS — Z79899 Other long term (current) drug therapy: Secondary | ICD-10-CM

## 2014-01-26 DIAGNOSIS — R262 Difficulty in walking, not elsewhere classified: Secondary | ICD-10-CM | POA: Diagnosis not present

## 2014-01-26 DIAGNOSIS — E119 Type 2 diabetes mellitus without complications: Secondary | ICD-10-CM | POA: Diagnosis present

## 2014-01-26 DIAGNOSIS — E785 Hyperlipidemia, unspecified: Secondary | ICD-10-CM | POA: Diagnosis not present

## 2014-01-26 DIAGNOSIS — G4733 Obstructive sleep apnea (adult) (pediatric): Secondary | ICD-10-CM | POA: Diagnosis present

## 2014-01-26 DIAGNOSIS — M6281 Muscle weakness (generalized): Secondary | ICD-10-CM | POA: Diagnosis not present

## 2014-01-26 DIAGNOSIS — G8929 Other chronic pain: Secondary | ICD-10-CM | POA: Diagnosis present

## 2014-01-26 DIAGNOSIS — J439 Emphysema, unspecified: Secondary | ICD-10-CM | POA: Diagnosis present

## 2014-01-26 DIAGNOSIS — N189 Chronic kidney disease, unspecified: Secondary | ICD-10-CM | POA: Diagnosis not present

## 2014-01-26 DIAGNOSIS — E1165 Type 2 diabetes mellitus with hyperglycemia: Secondary | ICD-10-CM | POA: Diagnosis present

## 2014-01-26 DIAGNOSIS — I5033 Acute on chronic diastolic (congestive) heart failure: Secondary | ICD-10-CM | POA: Diagnosis not present

## 2014-01-26 DIAGNOSIS — I129 Hypertensive chronic kidney disease with stage 1 through stage 4 chronic kidney disease, or unspecified chronic kidney disease: Secondary | ICD-10-CM | POA: Diagnosis present

## 2014-01-26 DIAGNOSIS — E559 Vitamin D deficiency, unspecified: Secondary | ICD-10-CM | POA: Diagnosis not present

## 2014-01-26 DIAGNOSIS — Z5189 Encounter for other specified aftercare: Secondary | ICD-10-CM | POA: Diagnosis not present

## 2014-01-26 DIAGNOSIS — K219 Gastro-esophageal reflux disease without esophagitis: Secondary | ICD-10-CM | POA: Diagnosis present

## 2014-01-26 DIAGNOSIS — Z860100 Personal history of colon polyps, unspecified: Secondary | ICD-10-CM

## 2014-01-26 DIAGNOSIS — J9600 Acute respiratory failure, unspecified whether with hypoxia or hypercapnia: Secondary | ICD-10-CM | POA: Diagnosis not present

## 2014-01-26 DIAGNOSIS — I10 Essential (primary) hypertension: Secondary | ICD-10-CM | POA: Diagnosis not present

## 2014-01-26 DIAGNOSIS — Z807 Family history of other malignant neoplasms of lymphoid, hematopoietic and related tissues: Secondary | ICD-10-CM

## 2014-01-26 DIAGNOSIS — Z66 Do not resuscitate: Secondary | ICD-10-CM | POA: Diagnosis not present

## 2014-01-26 DIAGNOSIS — D689 Coagulation defect, unspecified: Secondary | ICD-10-CM | POA: Diagnosis not present

## 2014-01-26 DIAGNOSIS — Z833 Family history of diabetes mellitus: Secondary | ICD-10-CM

## 2014-01-26 DIAGNOSIS — Z7901 Long term (current) use of anticoagulants: Secondary | ICD-10-CM

## 2014-01-26 DIAGNOSIS — Z8249 Family history of ischemic heart disease and other diseases of the circulatory system: Secondary | ICD-10-CM

## 2014-01-26 DIAGNOSIS — E039 Hypothyroidism, unspecified: Secondary | ICD-10-CM | POA: Diagnosis not present

## 2014-01-26 DIAGNOSIS — I480 Paroxysmal atrial fibrillation: Secondary | ICD-10-CM

## 2014-01-26 DIAGNOSIS — Z803 Family history of malignant neoplasm of breast: Secondary | ICD-10-CM

## 2014-01-26 DIAGNOSIS — K5730 Diverticulosis of large intestine without perforation or abscess without bleeding: Secondary | ICD-10-CM | POA: Diagnosis present

## 2014-01-26 DIAGNOSIS — I5043 Acute on chronic combined systolic (congestive) and diastolic (congestive) heart failure: Secondary | ICD-10-CM | POA: Diagnosis not present

## 2014-01-26 DIAGNOSIS — I499 Cardiac arrhythmia, unspecified: Secondary | ICD-10-CM | POA: Diagnosis not present

## 2014-01-26 DIAGNOSIS — I4891 Unspecified atrial fibrillation: Principal | ICD-10-CM | POA: Diagnosis present

## 2014-01-26 LAB — CBC WITH DIFFERENTIAL/PLATELET
Basophils Absolute: 0 10*3/uL (ref 0.0–0.1)
Basophils Relative: 0 % (ref 0–1)
EOS PCT: 1 % (ref 0–5)
Eosinophils Absolute: 0.1 10*3/uL (ref 0.0–0.7)
HCT: 30.5 % — ABNORMAL LOW (ref 36.0–46.0)
HEMOGLOBIN: 9.6 g/dL — AB (ref 12.0–15.0)
LYMPHS ABS: 0.5 10*3/uL — AB (ref 0.7–4.0)
LYMPHS PCT: 4 % — AB (ref 12–46)
MCH: 29.4 pg (ref 26.0–34.0)
MCHC: 31.5 g/dL (ref 30.0–36.0)
MCV: 93.6 fL (ref 78.0–100.0)
MONO ABS: 0.6 10*3/uL (ref 0.1–1.0)
Monocytes Relative: 5 % (ref 3–12)
Neutro Abs: 11.1 10*3/uL — ABNORMAL HIGH (ref 1.7–7.7)
Neutrophils Relative %: 90 % — ABNORMAL HIGH (ref 43–77)
Platelets: 211 10*3/uL (ref 150–400)
RBC: 3.26 MIL/uL — AB (ref 3.87–5.11)
RDW: 17.1 % — ABNORMAL HIGH (ref 11.5–15.5)
WBC: 12.4 10*3/uL — ABNORMAL HIGH (ref 4.0–10.5)

## 2014-01-26 LAB — BASIC METABOLIC PANEL
BUN: 19 mg/dL (ref 6–23)
CO2: 30 mEq/L (ref 19–32)
CREATININE: 1.12 mg/dL — AB (ref 0.50–1.10)
Calcium: 8.8 mg/dL (ref 8.4–10.5)
Chloride: 93 mEq/L — ABNORMAL LOW (ref 96–112)
GFR calc Af Amer: 57 mL/min — ABNORMAL LOW (ref >90)
GFR calc non Af Amer: 49 mL/min — ABNORMAL LOW (ref >90)
GLUCOSE: 322 mg/dL — AB (ref 70–99)
Potassium: 4.1 mEq/L (ref 3.7–5.3)
SODIUM: 134 meq/L — AB (ref 137–147)

## 2014-01-26 LAB — MRSA PCR SCREENING: MRSA BY PCR: NEGATIVE

## 2014-01-26 LAB — TROPONIN I: Troponin I: <0.3 ng/mL (ref <0.30)

## 2014-01-26 LAB — GLUCOSE, CAPILLARY: GLUCOSE-CAPILLARY: 206 mg/dL — AB (ref 70–99)

## 2014-01-26 LAB — PRO B NATRIURETIC PEPTIDE: PRO B NATRI PEPTIDE: 8093 pg/mL — AB (ref 0–125)

## 2014-01-26 MED ORDER — INSULIN ASPART 100 UNIT/ML ~~LOC~~ SOLN
0.0000 [IU] | Freq: Three times a day (TID) | SUBCUTANEOUS | Status: DC
  Administered 2014-01-27: 2 [IU] via SUBCUTANEOUS
  Administered 2014-01-27: 3 [IU] via SUBCUTANEOUS
  Administered 2014-01-27 – 2014-01-28 (×2): 2 [IU] via SUBCUTANEOUS
  Administered 2014-01-28: 5 [IU] via SUBCUTANEOUS
  Administered 2014-01-29: 2 [IU] via SUBCUTANEOUS

## 2014-01-26 MED ORDER — GUAIFENESIN-DM 100-10 MG/5ML PO SYRP: 5.0000 mL | ORAL_SOLUTION | ORAL | Status: DC | PRN

## 2014-01-26 MED ORDER — VITAMIN B-12 500 MCG PO TABS
500.0000 ug | ORAL_TABLET | Freq: Every day | ORAL | Status: DC
Start: 2014-01-27 — End: 2014-01-29
  Administered 2014-01-27 – 2014-01-29 (×3): 500 ug via ORAL
  Filled 2014-01-26 (×3): qty 1

## 2014-01-26 MED ORDER — GLYBURIDE MICRONIZED 3 MG PO TABS
3.0000 mg | ORAL_TABLET | Freq: Every day | ORAL | Status: DC
  Administered 2014-01-27 – 2014-01-29 (×3): 3 mg via ORAL
  Filled 2014-01-26 (×3): qty 1

## 2014-01-26 MED ORDER — INSULIN ASPART 100 UNIT/ML ~~LOC~~ SOLN
0.0000 [IU] | Freq: Every day | SUBCUTANEOUS | Status: DC
  Administered 2014-01-26 – 2014-01-27 (×2): 2 [IU] via SUBCUTANEOUS

## 2014-01-26 MED ORDER — ACETAMINOPHEN 325 MG PO TABS: 325.0000 mg | ORAL_TABLET | Freq: Every day | ORAL | Status: DC | PRN

## 2014-01-26 MED ORDER — METOPROLOL TARTRATE 1 MG/ML IV SOLN
5.0000 mg | Freq: Once | INTRAVENOUS | Status: AC
  Filled 2014-01-26: qty 5

## 2014-01-26 MED ORDER — VITAMIN E 180 MG (400 UNIT) PO CAPS
400.0000 [IU] | ORAL_CAPSULE | Freq: Every day | ORAL | Status: DC
  Administered 2014-01-27 – 2014-01-29 (×3): 400 [IU] via ORAL
  Filled 2014-01-26 (×3): qty 1

## 2014-01-26 MED ORDER — ONDANSETRON HCL 4 MG PO TABS: 4.0000 mg | ORAL_TABLET | Freq: Four times a day (QID) | ORAL | Status: DC | PRN

## 2014-01-26 MED ORDER — POTASSIUM CHLORIDE CRYS ER 20 MEQ PO TBCR
20.0000 meq | EXTENDED_RELEASE_TABLET | Freq: Every day | ORAL | Status: DC
  Administered 2014-01-27 – 2014-01-29 (×3): 20 meq via ORAL
  Filled 2014-01-26 (×3): qty 1

## 2014-01-26 MED ORDER — DILTIAZEM LOAD VIA INFUSION
25.0000 mg | Freq: Once | INTRAVENOUS | Status: AC
  Administered 2014-01-26: 25 mg via INTRAVENOUS
  Filled 2014-01-26: qty 25

## 2014-01-26 MED ORDER — DABIGATRAN ETEXILATE MESYLATE 150 MG PO CAPS
150.0000 mg | ORAL_CAPSULE | Freq: Two times a day (BID) | ORAL | Status: DC
  Administered 2014-01-26 – 2014-01-29 (×6): 150 mg via ORAL
  Filled 2014-01-26 (×7): qty 1

## 2014-01-26 MED ORDER — DILTIAZEM HCL 100 MG IV SOLR
5.0000 mg/h | INTRAVENOUS | Status: DC
  Administered 2014-01-26: 5 mg/h via INTRAVENOUS
  Administered 2014-01-26 – 2014-01-27 (×2): 15 mg/h via INTRAVENOUS
  Administered 2014-01-27 – 2014-01-28 (×3): 10 mg/h via INTRAVENOUS
  Administered 2014-01-28: 5 mg/h via INTRAVENOUS
  Administered 2014-01-28: 10 mg/h via INTRAVENOUS
  Filled 2014-01-26 (×6): qty 100

## 2014-01-26 MED ORDER — IPRATROPIUM-ALBUTEROL 0.5-2.5 (3) MG/3ML IN SOLN
3.0000 mL | Freq: Two times a day (BID) | RESPIRATORY_TRACT | Status: DC
  Administered 2014-01-26 – 2014-01-28 (×4): 3 mL via RESPIRATORY_TRACT
  Filled 2014-01-26 (×5): qty 3

## 2014-01-26 MED ORDER — EZETIMIBE-SIMVASTATIN 10-40 MG PO TABS
1.0000 | ORAL_TABLET | Freq: Every day | ORAL | Status: DC
  Administered 2014-01-26 – 2014-01-28 (×3): 1 via ORAL
  Filled 2014-01-26 (×4): qty 1

## 2014-01-26 MED ORDER — DILTIAZEM HCL ER COATED BEADS 360 MG PO CP24: 360.0000 mg | ORAL_CAPSULE | Freq: Every day | ORAL | Status: DC

## 2014-01-26 MED ORDER — BENZONATATE 100 MG PO CAPS
100.0000 mg | ORAL_CAPSULE | Freq: Three times a day (TID) | ORAL | Status: DC
  Administered 2014-01-26 – 2014-01-29 (×8): 100 mg via ORAL
  Filled 2014-01-26 (×10): qty 1

## 2014-01-26 MED ORDER — PREDNISONE 5 MG PO TABS: 5.0000 mg | ORAL_TABLET | Freq: Every day | ORAL | Status: DC

## 2014-01-26 MED ORDER — ALBUTEROL SULFATE (2.5 MG/3ML) 0.083% IN NEBU: 3.0000 mL | INHALATION_SOLUTION | Freq: Four times a day (QID) | RESPIRATORY_TRACT | Status: DC | PRN

## 2014-01-26 MED ORDER — POLYETHYLENE GLYCOL 3350 17 G PO PACK: 17.0000 g | PACK | Freq: Every day | ORAL | Status: DC | PRN

## 2014-01-26 MED ORDER — TIOTROPIUM BROMIDE MONOHYDRATE 18 MCG IN CAPS
18.0000 ug | ORAL_CAPSULE | Freq: Every day | RESPIRATORY_TRACT | Status: DC
  Administered 2014-01-27 – 2014-01-28 (×2): 18 ug via RESPIRATORY_TRACT
  Filled 2014-01-26: qty 5

## 2014-01-26 MED ORDER — INSULIN GLARGINE 100 UNIT/ML ~~LOC~~ SOLN
15.0000 [IU] | SUBCUTANEOUS | Status: DC
  Administered 2014-01-27 – 2014-01-28 (×2): 15 [IU] via SUBCUTANEOUS
  Filled 2014-01-26 (×5): qty 0.15

## 2014-01-26 MED ORDER — DOCUSATE SODIUM 100 MG PO CAPS
100.0000 mg | ORAL_CAPSULE | Freq: Two times a day (BID) | ORAL | Status: DC
  Administered 2014-01-27 – 2014-01-29 (×5): 100 mg via ORAL
  Filled 2014-01-26 (×7): qty 1

## 2014-01-26 MED ORDER — PANTOPRAZOLE SODIUM 40 MG PO TBEC
40.0000 mg | DELAYED_RELEASE_TABLET | Freq: Every day | ORAL | Status: DC
  Administered 2014-01-27 – 2014-01-29 (×3): 40 mg via ORAL
  Filled 2014-01-26 (×3): qty 1

## 2014-01-26 MED ORDER — HYDROCODONE-ACETAMINOPHEN 5-325 MG PO TABS: 1.0000 | ORAL_TABLET | ORAL | Status: DC | PRN

## 2014-01-26 MED ORDER — BISACODYL 10 MG RE SUPP: 10.0000 mg | Freq: Every day | RECTAL | Status: DC | PRN

## 2014-01-26 MED ORDER — ONDANSETRON HCL 4 MG/2ML IJ SOLN: 4.0000 mg | Freq: Four times a day (QID) | INTRAMUSCULAR | Status: DC | PRN

## 2014-01-26 MED ORDER — POLYETHYLENE GLYCOL 3350 17 G PO PACK
17.0000 g | PACK | Freq: Every day | ORAL | Status: DC | PRN
  Filled 2014-01-26: qty 1

## 2014-01-26 MED ORDER — SODIUM CHLORIDE 0.9 % IJ SOLN
3.0000 mL | Freq: Two times a day (BID) | INTRAMUSCULAR | Status: DC
  Administered 2014-01-27 – 2014-01-29 (×5): 3 mL via INTRAVENOUS

## 2014-01-26 MED ORDER — METOPROLOL TARTRATE 1 MG/ML IV SOLN
5.0000 mg | Freq: Once | INTRAVENOUS | Status: AC
  Administered 2014-01-27: 5 mg via INTRAVENOUS

## 2014-01-26 MED ORDER — VITAMIN D3 25 MCG (1000 UNIT) PO TABS
1000.0000 [IU] | ORAL_TABLET | Freq: Every day | ORAL | Status: DC
  Administered 2014-01-27 – 2014-01-29 (×3): 1000 [IU] via ORAL
  Filled 2014-01-26 (×3): qty 1

## 2014-01-26 MED ORDER — BUDESONIDE-FORMOTEROL FUMARATE 80-4.5 MCG/ACT IN AERO
2.0000 | INHALATION_SPRAY | Freq: Two times a day (BID) | RESPIRATORY_TRACT | Status: DC
  Administered 2014-01-26 – 2014-01-28 (×5): 2 via RESPIRATORY_TRACT
  Filled 2014-01-26 (×2): qty 6.9

## 2014-01-26 MED ORDER — GUAIFENESIN-CODEINE 100-10 MG/5ML PO SOLN: 5.0000 mL | Freq: Four times a day (QID) | ORAL | Status: DC | PRN

## 2014-01-26 NOTE — H&P (Signed)
Reason for Consult: Atrial Fibrillation Referring Physician: TRH  HPI: Paige Hardin is a 68 y.o. female, followed by Dr. Aundra Dubin, with known history of COPD, diastolic CHF(EF of 74% on recent echo) and atrial fibrillation on Pradaxa. She was recently admitted to [facility] on 01/14/14 for COPD exacerbation, CHF and atrial fibrillation with RVR. Once stabilized, she was discharged to a SNF on 01/23/14. She was discharged on 40 mg of Lasix BID for CHF, 120 mg of Cardizem and 150 mg of Pradaxa for PAF. She was also prescribed a prednisone taper for her COPD exacerbation.   She states that on post hospital day 1 she developed palpitations. She denied dizziness, syncope/ near syncope. She had mild associated chest tightness. She also notes DOE, but denies resting dyspnea. She was assessed by the MD at the SNF and her HR was elevated. Her Cardizem was apparently doubled and the decision was made to watch and wait. After several day of persistent afib w/ RVR w/o improvement, she was transported back to [facility] for further care. On arrival, an EKG confirmed Afib w/ RVR. She was admitted by [facility] and placed on a Cardizem drip.   Past Medical History  Diagnosis Date  . Chronic low back pain   . Emphysema   . Paroxysmal atrial fibrillation   . COPD (chronic obstructive pulmonary disease)   . Hyperlipidemia   . Diastolic congestive heart failure   . Obese   . Carotid atherosclerosis   . Lung nodules   . Hoarseness, chronic   . Pneumonia 1990's    "once"  . Type 2 diabetes mellitus   . GERD (gastroesophageal reflux disease)   . Arthritis     "joints" (01/14/2014)  . Depression     "maybe" (01/14/2014    Past Surgical History  Procedure Laterality Date  . Esophagogastroduodenoscopy  01/26/2012    Procedure: ESOPHAGOGASTRODUODENOSCOPY (EGD);  Surgeon: Beryle Beams, MD;  Location: [facility];  Service: Endoscopy;  Laterality: N/A;  . Colonoscopy  01/26/2012    Procedure: COLONOSCOPY;  Surgeon: Beryle Beams, MD;   Location: [facility];  Service: Endoscopy;  Laterality: N/A;  . Breast biopsy Left ~ 1980    "solid"  . Breast lumpectomy Left ~ 1980    "removed a mass; it was benign" (01/14/2014)  . Total abdominal hysterectomy  1994    Family History  Problem Relation Age of Onset  . Breast cancer Sister   . Heart disease    . Hodgkin's lymphoma    . Asthma Mother     Social History:  reports that she quit smoking about 5 years ago. Her smoking use included Cigarettes. She has a 135 pack-year smoking history. She has never used smokeless tobacco. She reports that she drinks alcohol. She reports that she does not use illicit drugs.  Allergies:  Allergies  Allergen Reactions  . Ace Inhibitors Cough  . Clindamycin Rash    Medications:  Prior to Admission medications   Medication Sig Start Date End Date Taking? Authorizing Provider  acetaminophen (TYLENOL) 325 MG tablet Take 325 mg by mouth daily as needed for mild pain.    Yes Historical Provider, MD  albuterol (PROAIR HFA) 108 (90 BASE) MCG/ACT inhaler Inhale 2 puffs into the lungs every 6 (six) hours as needed for shortness of breath. 11/07/11  Yes Kathee Delton, MD  Ascorbic Acid (VITAMIN C) 500 MG CAPS Take 1 tablet by mouth daily.   Yes Historical Provider, MD  benzonatate (TESSALON) 100 MG capsule Take  1 capsule (100 mg total) by mouth 3 (three) times daily. 01/23/14  Yes Ripudeep Krystal Eaton, MD  bisacodyl (DULCOLAX) 10 MG suppository Place 1 suppository (10 mg total) rectally daily as needed for moderate constipation. 01/23/14  Yes Ripudeep Krystal Eaton, MD  budesonide-formoterol (SYMBICORT) 80-4.5 MCG/ACT inhaler Inhale 2 puffs into the lungs 2 (two) times daily. 11/07/11  Yes Kathee Delton, MD  cholecalciferol (VITAMIN D) 1000 UNITS tablet Take 1,000 Units by mouth daily.     Yes Historical Provider, MD  dabigatran (PRADAXA) 150 MG CAPS capsule Take 1 capsule (150 mg total) by mouth 2 (two) times daily. 01/23/14  Yes Ripudeep Krystal Eaton, MD  diltiazem  (CARDIZEM CD) 240 MG 24 hr capsule Take 240 mg by mouth daily.   Yes Historical Provider, MD  docusate sodium 100 MG CAPS Take 100 mg by mouth 2 (two) times daily. 01/23/14  Yes Ripudeep Krystal Eaton, MD  ezetimibe-simvastatin (VYTORIN) 10-40 MG per tablet Take 1 tablet by mouth at bedtime.    Yes Historical Provider, MD  glyBURIDE micronized (GLYNASE) 3 MG tablet Take 3 mg by mouth daily.     Yes Historical Provider, MD  guaiFENesin-codeine 100-10 MG/5ML syrup Take 5 mLs by mouth every 6 (six) hours as needed for cough. 01/23/14  Yes Ripudeep K Rai, MD  ipratropium-albuterol (DUONEB) 0.5-2.5 (3) MG/3ML SOLN Take 3 mLs by nebulization 2 (two) times daily. 01/23/14  Yes Ripudeep Krystal Eaton, MD  Multiple Vitamins-Iron (MULTIVITAMIN/IRON PO) Take 1 tablet by mouth daily.   Yes Historical Provider, MD  OVER THE COUNTER MEDICATION Take 1 tablet by mouth daily. Calcium 333 mg + Zinc 5 mg + Magnesium 133 mg   Yes Historical Provider, MD  pantoprazole (PROTONIX) 40 MG tablet Take 1 tablet (40 mg total) by mouth daily. 01/23/14  Yes Ripudeep Krystal Eaton, MD  polyethylene glycol (MIRALAX / GLYCOLAX) packet Take 17 g by mouth daily as needed for moderate constipation. 01/23/14  Yes Ripudeep Krystal Eaton, MD  potassium chloride SA (K-DUR,KLOR-CON) 20 MEQ tablet Take 20 mEq by mouth daily.    Yes Historical Provider, MD  tiotropium (SPIRIVA) 18 MCG inhalation capsule Place 18 mcg into inhaler and inhale daily.   Yes Historical Provider, MD  vitamin B-12 (CYANOCOBALAMIN) 500 MCG tablet Take 500 mcg by mouth daily.   Yes Historical Provider, MD  vitamin E 400 UNIT capsule Take 400 Units by mouth daily.   Yes Historical Provider, MD  predniSONE (DELTASONE) 10 MG tablet Take 1 tablet (10 mg total) by mouth daily with breakfast. X 3days, then off 01/24/14   Ripudeep Krystal Eaton, MD     Results for orders placed during the hospital encounter of 01/26/14 (from the past 48 hour(s))  CBC WITH DIFFERENTIAL     Status: Abnormal   Collection Time     01/26/14  3:17 PM      Result Value Ref Range   WBC 12.4 (*) 4.0 - 10.5 K/uL   RBC 3.26 (*) 3.87 - 5.11 MIL/uL   Hemoglobin 9.6 (*) 12.0 - 15.0 g/dL   HCT 30.5 (*) 36.0 - 46.0 %   MCV 93.6  78.0 - 100.0 fL   MCH 29.4  26.0 - 34.0 pg   MCHC 31.5  30.0 - 36.0 g/dL   RDW 17.1 (*) 11.5 - 15.5 %   Platelets 211  150 - 400 K/uL   Neutrophils Relative % 90 (*) 43 - 77 %   Neutro Abs 11.1 (*) 1.7 - 7.7 K/uL  Lymphocytes Relative 4 (*) 12 - 46 %   Lymphs Abs 0.5 (*) 0.7 - 4.0 K/uL   Monocytes Relative 5  3 - 12 %   Monocytes Absolute 0.6  0.1 - 1.0 K/uL   Eosinophils Relative 1  0 - 5 %   Eosinophils Absolute 0.1  0.0 - 0.7 K/uL   Basophils Relative 0  0 - 1 %   Basophils Absolute 0.0  0.0 - 0.1 K/uL  BASIC METABOLIC PANEL     Status: Abnormal   Collection Time    01/26/14  3:17 PM      Result Value Ref Range   Sodium 134 (*) 137 - 147 mEq/L   Potassium 4.1  3.7 - 5.3 mEq/L   Chloride 93 (*) 96 - 112 mEq/L   CO2 30  19 - 32 mEq/L   Glucose, Bld 322 (*) 70 - 99 mg/dL   BUN 19  6 - 23 mg/dL   Creatinine, Ser 1.12 (*) 0.50 - 1.10 mg/dL   Calcium 8.8  8.4 - 10.5 mg/dL   GFR calc non Af Amer 49 (*) >90 mL/min   GFR calc Af Amer 57 (*) >90 mL/min   Comment: (NOTE)     The eGFR has been calculated using the CKD EPI equation.     This calculation has not been validated in all clinical situations.     eGFR's persistently <90 mL/min signify possible Chronic Kidney     Disease.  TROPONIN I     Status: None   Collection Time    01/26/14  3:17 PM      Result Value Ref Range   Troponin I <0.30  <0.30 ng/mL   Comment:            Due to the release kinetics of cTnI,     a negative result within the first hours     of the onset of symptoms does not rule out     myocardial infarction with certainty.     If myocardial infarction is still suspected,     repeat the test at appropriate intervals.  PRO B NATRIURETIC PEPTIDE     Status: Abnormal   Collection Time    01/26/14  3:17 PM       Result Value Ref Range   Pro B Natriuretic peptide (BNP) 8093.0 (*) 0 - 125 pg/mL    Dg Chest Port 1 View  01/26/2014   CLINICAL DATA:  Tachycardia, chest pressure, former smoker  EXAM: PORTABLE CHEST - 1 VIEW  COMPARISON:  DG CHEST 2 VIEW dated 01/22/2014  FINDINGS: Mild cardiac enlargement. Stable calcification of the aortic arch. Mild vascular congestion. No pulmonary edema or pleural effusion.  IMPRESSION: Stable cardiac enlargement with mild vascular congestion but no pulmonary edema.   Electronically Signed   By: Skipper Cliche M.D.   On: 01/26/2014 15:48    Review of Systems  Respiratory: Positive for shortness of breath.   Cardiovascular: Positive for chest pain, palpitations and leg swelling.  Neurological: Negative for dizziness and loss of consciousness.  All other systems reviewed and are negative.   Blood pressure 117/64, pulse 95, temperature 97.7 F (36.5 C), temperature source Oral, resp. rate 20, height 5' 5"  (1.651 m), weight 295 lb 13.7 oz (134.2 kg), SpO2 97.00%. Physical Exam  Constitutional: She is oriented to person, place, and time. She appears well-nourished. No distress.  Morbidly obese  Neck: No JVD present.  Cardiovascular: Intact distal pulses.  Exam reveals no gallop  and no friction rub.   No murmur heard. Pulses:      Radial pulses are 2+ on the right side, and 2+ on the left side.       Dorsalis pedis pulses are 2+ on the right side, and 2+ on the left side.  Respiratory: Effort normal. No respiratory distress. She has wheezes (mild expiratory wheezing).  Decreased BS bilaterally, c/w COPD   Musculoskeletal: She exhibits edema (1+ LEE).  Neurological: She is alert and oriented to person, place, and time.  Skin: Skin is warm and dry. She is not diaphoretic.  Psychiatric: She has a normal mood and affect. Her behavior is normal.    Assessment/Plan: Active Problems:   HYPERLIPIDEMIA-MIXED   COPD with emphysema    CAROTID BRUIT   PAF (paroxysmal  atrial fibrillation)   Encounter for long-term (current) use of anticoagulants   Acute on chronic diastolic congestive heart failure   HTN (hypertension)   Pulmonary HTN- pa 53 mmHg Echo 01/15/14   Morbid obesity- BMI 50   Poorly controlled diabetes mellitus   Chronic renal disease, stage III   Paroxysmal a-fib   Atrial fibrillation with RVR  Plan: 68 y/o female recently discharged from hospital 3 days ago after treatment for COPD exacerbation, CHF exacerbation and atrial fibrillation w/ RVR, returns with recurrent atrial fibrillation with a rapid ventricular response. IV Cardizem has been initiated. Currently at 15 mg/hr. HR continues to fluctuate between the low 100s-120s. BP is stable at 117/64. Only complaint is palpitations. TSH pending. She has some slight LEE but does not appear to be excessively volume overloaded. Continue rate control approach for now. She has COPD, however ? trying a cardioselective BB such as lopressor, in addition to CCB. Will defer to MD. Will reassess in the am. If her Afib continues to be difficult to control with rate control meds, may need to consider an antiarrhythmic. Recommend changing PRN albuterol to Xopenex to avoid reflex tachycardia. Continue Pradaxa for stroke prophylaxis. MD to follow.    SIMMONS, BRITTAINY 01/26/2014, 7:03 PM   Personally seen and examined. Agree with above. If HR not controlled with diltiazem, OK with trying metoprolol. Morbid obesity playing a role as well. OSA likely.   Candee Furbish, MD

## 2014-01-26 NOTE — ED Notes (Signed)
Per EMS: pt states that she has been having irregular heart rate since Sat. Pt states that she is from [name] and she told staff and they waited to see if she would convert but when she saw the MD today her HR was still elevated so they decided to send her out.

## 2014-01-26 NOTE — ED Notes (Signed)
Attempted report 

## 2014-01-26 NOTE — H&P (Addendum)
Patient Demographics  Paige Hardin, is a 68 y.o. female  MRN: [id number]   DOB - 09/01/1946  Admit Date - 01/26/2014  Outpatient Primary MD for the patient is Stephens Shire, MD   With History of -  Past Medical History  Diagnosis Date  . Chronic low back pain   . Emphysema   . Paroxysmal atrial fibrillation   . COPD (chronic obstructive pulmonary disease)   . Hyperlipidemia   . Diastolic congestive heart failure   . Obese   . Carotid atherosclerosis   . Lung nodules   . Hoarseness, chronic   . Pneumonia 1990's    "once"  . Type 2 diabetes mellitus   . GERD (gastroesophageal reflux disease)   . Arthritis     "joints" (01/14/2014)  . Depression     "maybe" (01/14/2014      Past Surgical History  Procedure Laterality Date  . Esophagogastroduodenoscopy  01/26/2012    Procedure: ESOPHAGOGASTRODUODENOSCOPY (EGD);  Surgeon: Beryle Beams, MD;  Location: [facility];  Service: Endoscopy;  Laterality: N/A;  . Colonoscopy  01/26/2012    Procedure: COLONOSCOPY;  Surgeon: Beryle Beams, MD;  Location: [facility];  Service: Endoscopy;  Laterality: N/A;  . Breast biopsy Left ~ 1980    "solid"  . Breast lumpectomy Left ~ 1980    "removed a mass; it was benign" (01/14/2014)  . Total abdominal hysterectomy  1994    in for   Chief Complaint  Patient presents with  . Irregular Heart Beat  . Atrial Fibrillation     HPI  Paige Hardin  is a 68 y.o. female, with history of paroxysmal atrial fibrillation on anticoagulation, COPD, chronic diastolic heart failure EF around 60%, chronic low back pain, pulmonary hypertension, dyslipidemia, morbid obesity, poorly controlled type 2 diabetes mellitus who is recently admitted for acute respiratory failure caused by COPD exacerbation and subsequently discharged to blue and also  nursing home now comes back after experiencing palpitations which started in her sleep earlier this morning. Patient was subsequently brought in the ER where she was diagnosed with atrial fibrillation with RVR and I was called to admit the patient.   She denies any fever chills, she has no chest pain, had mild chest pressure which has not resolved, she denies any cough or shortness of breath, no bowel pain or diarrhea, no focal weakness, no falls. She is able to ambulate with the help of a walker, she has chronic edema which now has almost completely resolved.    Review of Systems    In addition to the HPI above,   No Fever-chills, No Headache, No changes with Vision or hearing, No problems swallowing food or Liquids, No Chest pain, Cough or Shortness of Breath, but +ve palpitations No Abdominal pain, No Nausea or Vommitting, Bowel movements are regular, No Blood in stool or Urine, No dysuria, No new skin rashes or bruises, No new joints pains-aches,  No new weakness, tingling,  numbness in any extremity, No recent weight gain or loss, No polyuria, polydypsia or polyphagia, No significant Mental Stressors.  A full 10 point Review of Systems was done, except as stated above, all other Review of Systems were negative.   Social History History  Substance Use Topics  . Smoking status: Former Smoker -- 3.00 packs/day for 45 years    Types: Cigarettes    Quit date: 12/28/2008  . Smokeless tobacco: Never Used  . Alcohol Use: Yes     Comment: 01/14/2014 "used to drink socially in the past; last drink was probably 10 yr ago"      Family History Family History  Problem Relation Age of Onset  . Breast cancer Sister   . Heart disease    . Hodgkin's lymphoma    . Asthma Mother       Prior to Admission medications   Medication Sig Start Date End Date Taking? Authorizing Provider  acetaminophen (TYLENOL) 325 MG tablet Take 325 mg by mouth daily as needed for mild pain.    Yes  Historical Provider, MD  albuterol (PROAIR HFA) 108 (90 BASE) MCG/ACT inhaler Inhale 2 puffs into the lungs every 6 (six) hours as needed for shortness of breath. 11/07/11  Yes Kathee Delton, MD  Ascorbic Acid (VITAMIN C) 500 MG CAPS Take 1 tablet by mouth daily.   Yes Historical Provider, MD  benzonatate (TESSALON) 100 MG capsule Take 1 capsule (100 mg total) by mouth 3 (three) times daily. 01/23/14  Yes Ripudeep Krystal Eaton, MD  bisacodyl (DULCOLAX) 10 MG suppository Place 1 suppository (10 mg total) rectally daily as needed for moderate constipation. 01/23/14  Yes Ripudeep Krystal Eaton, MD  budesonide-formoterol (SYMBICORT) 80-4.5 MCG/ACT inhaler Inhale 2 puffs into the lungs 2 (two) times daily. 11/07/11  Yes Kathee Delton, MD  cholecalciferol (VITAMIN D) 1000 UNITS tablet Take 1,000 Units by mouth daily.     Yes Historical Provider, MD  dabigatran (PRADAXA) 150 MG CAPS capsule Take 1 capsule (150 mg total) by mouth 2 (two) times daily. 01/23/14  Yes Ripudeep Krystal Eaton, MD  diltiazem (CARDIZEM CD) 240 MG 24 hr capsule Take 240 mg by mouth daily.   Yes Historical Provider, MD  docusate sodium 100 MG CAPS Take 100 mg by mouth 2 (two) times daily. 01/23/14  Yes Ripudeep Krystal Eaton, MD  ezetimibe-simvastatin (VYTORIN) 10-40 MG per tablet Take 1 tablet by mouth at bedtime.    Yes Historical Provider, MD  glyBURIDE micronized (GLYNASE) 3 MG tablet Take 3 mg by mouth daily.     Yes Historical Provider, MD  guaiFENesin-codeine 100-10 MG/5ML syrup Take 5 mLs by mouth every 6 (six) hours as needed for cough. 01/23/14  Yes Ripudeep K Rai, MD  ipratropium-albuterol (DUONEB) 0.5-2.5 (3) MG/3ML SOLN Take 3 mLs by nebulization 2 (two) times daily. 01/23/14  Yes Ripudeep Krystal Eaton, MD  Multiple Vitamins-Iron (MULTIVITAMIN/IRON PO) Take 1 tablet by mouth daily.   Yes Historical Provider, MD  OVER THE COUNTER MEDICATION Take 1 tablet by mouth daily. Calcium 333 mg + Zinc 5 mg + Magnesium 133 mg   Yes Historical Provider, MD  pantoprazole  (PROTONIX) 40 MG tablet Take 1 tablet (40 mg total) by mouth daily. 01/23/14  Yes Ripudeep Krystal Eaton, MD  polyethylene glycol (MIRALAX / GLYCOLAX) packet Take 17 g by mouth daily as needed for moderate constipation. 01/23/14  Yes Ripudeep Krystal Eaton, MD  potassium chloride SA (K-DUR,KLOR-CON) 20 MEQ tablet Take 20 mEq by mouth daily.  Yes Historical Provider, MD  tiotropium (SPIRIVA) 18 MCG inhalation capsule Place 18 mcg into inhaler and inhale daily.   Yes Historical Provider, MD  vitamin B-12 (CYANOCOBALAMIN) 500 MCG tablet Take 500 mcg by mouth daily.   Yes Historical Provider, MD  vitamin E 400 UNIT capsule Take 400 Units by mouth daily.   Yes Historical Provider, MD  predniSONE (DELTASONE) 10 MG tablet Take 1 tablet (10 mg total) by mouth daily with breakfast. X 3days, then off 01/24/14   Ripudeep Krystal Eaton, MD    Allergies  Allergen Reactions  . Ace Inhibitors Cough  . Clindamycin Rash    Physical Exam  Vitals  Blood pressure 122/73, pulse 59, temperature 98.5 F (36.9 C), temperature source Oral, resp. rate 20, height 5\' 5"  (1.651 m), weight 136.986 kg (302 lb), SpO2 92.00%.   1. General obese middle aged [place] female lying in bed in NAD,     2. Normal affect and insight, Not Suicidal or Homicidal, Awake Alert, Oriented X 3.  3. No F.N deficits, ALL C.Nerves Intact, Strength 5/5 all 4 extremities, Sensation intact all 4 extremities, Plantars down going.  4. Ears and Eyes appear Normal, Conjunctivae clear, PERRLA. Moist Oral Mucosa.  5. Supple Neck, No JVD, No cervical lymphadenopathy appriciated, No Carotid Bruits.  6. Symmetrical Chest wall movement, Good air movement bilaterally, CTAB.  7. iRRR, No Gallops, Rubs or Murmurs, No Parasternal Heave.  8. Positive Bowel Sounds, Abdomen Soft, Non tender, No organomegaly appriciated,No rebound -guarding or rigidity.  9.  No Cyanosis, Normal Skin Turgor, No Skin Rash or Bruise.  10. Good muscle tone,  joints appear normal , no effusions,  Normal ROM.  11. No Palpable Lymph Nodes in Neck or Axillae     Data Review  CBC  Recent Labs Lab 01/22/14 0344 01/26/14 1517  WBC 10.8* 12.4*  HGB 8.3* 9.6*  HCT 25.7* 30.5*  PLT 206 211  MCV 91.8 93.6  MCH 29.6 29.4  MCHC 32.3 31.5  RDW 16.8* 17.1*  LYMPHSABS  --  0.5*  MONOABS  --  0.6  EOSABS  --  0.1  BASOSABS  --  0.0   ------------------------------------------------------------------------------------------------------------------  Chemistries   Recent Labs Lab 01/20/14 0425 01/21/14 0400 01/22/14 0344 01/23/14 0642 01/26/14 1517  NA 136* 136* 136* 140 134*  K 4.4 4.1 4.4 4.0 4.1  CL 96 95* 95* 98 93*  CO2 27 28 29  32 30  GLUCOSE 231* 112* 146* 91 322*  BUN 68* 63* 49* 33* 19  CREATININE 1.62* 1.66* 1.38* 1.08 1.12*  CALCIUM 7.8* 8.0* 7.9* 8.4 8.8   ------------------------------------------------------------------------------------------------------------------ estimated creatinine clearance is 67.5 ml/min (by C-G formula based on Cr of 1.12). ------------------------------------------------------------------------------------------------------------------ No results found for this basename: TSH, T4TOTAL, FREET3, T3FREE, THYROIDAB,  in the last 72 hours   Coagulation profile No results found for this basename: INR, PROTIME,  in the last 168 hours ------------------------------------------------------------------------------------------------------------------- No results found for this basename: DDIMER,  in the last 72 hours -------------------------------------------------------------------------------------------------------------------  Cardiac Enzymes  Recent Labs Lab 01/26/14 1517  TROPONINI <0.30   ------------------------------------------------------------------------------------------------------------------ No components found with this basename: POCBNP,     ---------------------------------------------------------------------------------------------------------------  Urinalysis    Component Value Date/Time   COLORURINE YELLOW 12/28/2008 2125   APPEARANCEUR CLOUDY* 12/28/2008 2125   LABSPEC 1.014 12/28/2008 2125   PHURINE 6.5 12/28/2008 2125   GLUCOSEU NEGATIVE 12/28/2008 2125   HGBUR TRACE* 12/28/2008 2125   BILIRUBINUR NEGATIVE 12/28/2008 2125   [place] 12/28/2008 2125   PROTEINUR NEGATIVE  12/28/2008 2125   UROBILINOGEN 0.2 12/28/2008 2125   NITRITE POSITIVE* 12/28/2008 2125   LEUKOCYTESUR MODERATE* 12/28/2008 2125    ----------------------------------------------------------------------------------------------------------------  Imaging results:    Echo   - Left ventricle: The cavity size was mildly dilated. Wall thickness was increased in a pattern of mild LVH. The estimated ejection fraction was 60%. Wall motion was normal; there were no regional wall motion abnormalities. Findings consistent with left ventricular diastolic dysfunction. Doppler parameters are consistent with high ventricular filling pressure. - Aortic valve: Sclerosis without stenosis. No significant regurgitation. - Mitral valve: Mildly calcified annulus. Mildly thickened leaflets . Mild regurgitation. - Left atrium: The atrium was mildly to moderately dilated. - Right atrium: The atrium was mildly dilated. - Pulmonary arteries: PA peak pressure: 74mm Hg (S). - Inferior vena cava: The vessel was dilated; the respirophasic diameter changes were blunted (< 50%); findings are consistent with elevated central venous pressure.    Dg Chest Port 1 View  01/26/2014   CLINICAL DATA:  Tachycardia, chest pressure, former smoker  EXAM: PORTABLE CHEST - 1 VIEW  COMPARISON:  DG CHEST 2 VIEW dated 01/22/2014  FINDINGS: Mild cardiac enlargement. Stable calcification of the aortic arch. Mild vascular congestion. No pulmonary edema or pleural effusion.  IMPRESSION: Stable  cardiac enlargement with mild vascular congestion but no pulmonary edema.   Electronically Signed   By: Skipper Cliche M.D.   On: 01/26/2014 15:48         My personal review of EKG: Rhythm Afib, Rate 139 /min,  no Acute ST changes    Assessment & Plan    1. A. fib and RVR in a patient with history of paroxysmal atrial fibrillation. She will be admitted to telemetry bed, IV Cardizem drip with bolus has been initiated, her home dose oral Cardizem will be doubled and given tonight, take off Cardizem drip once heart rate is below 100, continue Peridex of her anticoagulation. Recent echo noted with a preserved EF of 60% and chronic diastolic CHF. Check TSH. Cardiology consulted.    2. COPD with recent exacerbation. No wheezing on exam, supportive care with oxygen and nebulizer treatments as needed, she's not on home oxygen. Continue prednisone taper from recent admission.    3. Pulmonary hypertension. No acute issues oxygen and applies the treatments as needed outpatient but may follow post discharge.    4. Diabetes mother has type II. Check A1c, continue oral hypoglycemic from home, had sliding scale insulin and Lantus for better control sugars here are around 350.    5. Essential hypertension. No acute issues home medications will be continued which include Cardizem.    6. GERD continue PPI.    7. Dyslipidemia continue Vytorin.    8. Chronic grade 2 diastolic dysfunction with EF 60% on recent echo gram. Compensated continue Lasix at home dose. Monitor salt and fluid intake and daily weights.      DVT Prophylaxis Pradaxa  AM Labs Ordered, also please review Full Orders  Family Communication: Admission, patients condition and plan of care including tests being ordered have been discussed with the patient and husband who indicate understanding and agree with the plan and Code Status.  Code Status DNR  Likely DC to SNF  Condition Fair  Time spent in minutes :  35    SINGH,PRASHANT K M.D on 01/26/2014 at 5:43 PM  Between 7am to 7pm - Pager - [phone]  After 7pm go to [email or web address] - password [email or web address]  And look for the night coverage person covering  me after hours  [facility] Office  [phone]

## 2014-01-26 NOTE — ED Provider Notes (Signed)
CSN: [id number]     Arrival date & time 01/26/14  1452 History   First MD Initiated Contact with Patient 01/26/14 1504     Chief Complaint  Patient presents with  . Irregular Heart Beat  . Atrial Fibrillation     (Consider location/radiation/quality/duration/timing/severity/associated sxs/prior Treatment) HPI Comments: 68 yo female presenting with tachycardia. She states she is in the hospital last week for A. fib. She was discharged 3 days ago. 2 days ago, while at [facility] rehabilitation facility, she was found to have a heart rate as low as 120 as high as 170. She was given additional diltiazem, which did not improve her heart rate. She states that her heart rate was this high for 2 days, constantly. Today she was transferred ED for further management.  She complains of some intermittent chest tightness. She also complains of shortness of breath, which is typical for her.   Past Medical History  Diagnosis Date  . Chronic low back pain   . Emphysema   . Paroxysmal atrial fibrillation   . COPD (chronic obstructive pulmonary disease)   . Hyperlipidemia   . Diastolic congestive heart failure   . Obese   . Carotid atherosclerosis   . Lung nodules   . Hoarseness, chronic   . Pneumonia 1990's    "once"  . Type 2 diabetes mellitus   . GERD (gastroesophageal reflux disease)   . Arthritis     "joints" (01/14/2014)  . Depression     "maybe" (01/14/2014   Past Surgical History  Procedure Laterality Date  . Esophagogastroduodenoscopy  01/26/2012    Procedure: ESOPHAGOGASTRODUODENOSCOPY (EGD);  Surgeon: Beryle Beams, MD;  Location: [facility];  Service: Endoscopy;  Laterality: N/A;  . Colonoscopy  01/26/2012    Procedure: COLONOSCOPY;  Surgeon: Beryle Beams, MD;  Location: [facility];  Service: Endoscopy;  Laterality: N/A;  . Breast biopsy Left ~ 1980    "solid"  . Breast lumpectomy Left ~ 1980    "removed a mass; it was benign" (01/14/2014)  . Total abdominal hysterectomy  1994    Family History  Problem Relation Age of Onset  . Breast cancer Sister   . Heart disease    . Hodgkin's lymphoma    . Asthma Mother    History  Substance Use Topics  . Smoking status: Former Smoker -- 3.00 packs/day for 45 years    Types: Cigarettes    Quit date: 12/28/2008  . Smokeless tobacco: Never Used  . Alcohol Use: Yes     Comment: 01/14/2014 "used to drink socially in the past; last drink was probably 10 yr ago"   OB History   Grav Para Term Preterm Abortions TAB SAB Ect Mult Living                 Review of Systems  Respiratory: Positive for shortness of breath. Negative for cough.   Cardiovascular: Positive for chest pain.  Gastrointestinal: Negative for nausea, vomiting, abdominal pain and diarrhea.  All other systems reviewed and are negative.      Allergies  Ace inhibitors and Clindamycin  Home Medications   Current Outpatient Rx  Name  Route  Sig  Dispense  Refill  . acetaminophen (TYLENOL) 325 MG tablet   Oral   Take 325 mg by mouth daily as needed for mild pain.          Marland Kitchen albuterol (PROAIR HFA) 108 (90 BASE) MCG/ACT inhaler   Inhalation   Inhale 2 puffs into the lungs  every 6 (six) hours as needed for shortness of breath.   1 Inhaler   3   . Ascorbic Acid (VITAMIN C) 500 MG CAPS   Oral   Take 1 tablet by mouth daily.         . benzonatate (TESSALON) 100 MG capsule   Oral   Take 1 capsule (100 mg total) by mouth 3 (three) times daily.   30 capsule   0   . bisacodyl (DULCOLAX) 10 MG suppository   Rectal   Place 1 suppository (10 mg total) rectally daily as needed for moderate constipation.   12 suppository   0   . budesonide-formoterol (SYMBICORT) 80-4.5 MCG/ACT inhaler   Inhalation   Inhale 2 puffs into the lungs 2 (two) times daily.   1 Inhaler   5   . cholecalciferol (VITAMIN D) 1000 UNITS tablet   Oral   Take 1,000 Units by mouth daily.           . dabigatran (PRADAXA) 150 MG CAPS capsule   Oral   Take 1 capsule  (150 mg total) by mouth 2 (two) times daily.   60 capsule      . diltiazem (CARDIZEM CD) 120 MG 24 hr capsule   Oral   Take 120 mg by mouth daily.         Marland Kitchen docusate sodium 100 MG CAPS   Oral   Take 100 mg by mouth 2 (two) times daily.   10 capsule   0   . ezetimibe-simvastatin (VYTORIN) 10-40 MG per tablet   Oral   Take 1 tablet by mouth at bedtime.          . furosemide (LASIX) 40 MG tablet   Oral   Take 2 tablets (80 mg total) by mouth daily.   30 tablet      . glyBURIDE micronized (GLYNASE) 3 MG tablet   Oral   Take 3 mg by mouth daily.           Marland Kitchen guaiFENesin-codeine 100-10 MG/5ML syrup   Oral   Take 5 mLs by mouth every 6 (six) hours as needed for cough.   120 mL   0   . ipratropium-albuterol (DUONEB) 0.5-2.5 (3) MG/3ML SOLN   Nebulization   Take 3 mLs by nebulization 2 (two) times daily.   360 mL      . Multiple Vitamins-Iron (MULTIVITAMIN/IRON PO)   Oral   Take 1 tablet by mouth daily.         Marland Kitchen OVER THE COUNTER MEDICATION   Oral   Take 1 tablet by mouth daily. Calcium 333 mg + Zinc 5 mg + Magnesium 133 mg         . pantoprazole (PROTONIX) 40 MG tablet   Oral   Take 1 tablet (40 mg total) by mouth daily.         . polyethylene glycol (MIRALAX / GLYCOLAX) packet   Oral   Take 17 g by mouth daily as needed for moderate constipation.   14 each   0   . potassium chloride SA (K-DUR,KLOR-CON) 20 MEQ tablet   Oral   Take 40 mEq by mouth daily.          . predniSONE (DELTASONE) 10 MG tablet   Oral   Take 1 tablet (10 mg total) by mouth daily with breakfast. X 3days, then off         . tiotropium (SPIRIVA) 18 MCG inhalation capsule   Inhalation  Place 18 mcg into inhaler and inhale daily.         . vitamin B-12 (CYANOCOBALAMIN) 500 MCG tablet   Oral   Take 500 mcg by mouth daily.         . vitamin E 400 UNIT capsule   Oral   Take 400 Units by mouth daily.          BP 118/84  Pulse 155  Temp(Src) 98.5 F (36.9 C)  (Oral)  Resp 16  Ht 5\' 5"  (1.651 m)  Wt 302 lb (136.986 kg)  BMI 50.26 kg/m2  SpO2 99% Physical Exam  Nursing note and vitals reviewed. Constitutional: She is oriented to person, place, and time. She appears well-developed and well-nourished. No distress.  HENT:  Head: Normocephalic and atraumatic.  Mouth/Throat: Oropharynx is clear and moist.  Eyes: Conjunctivae are normal. Pupils are equal, round, and reactive to light. No scleral icterus.  Neck: Neck supple.  Cardiovascular: Normal heart sounds and intact distal pulses.  An irregularly irregular rhythm present. Tachycardia present.   No murmur heard. Pulmonary/Chest: Effort normal. No stridor. No respiratory distress. She has decreased breath sounds in the right lower field and the left lower field. She has no rales.  Abdominal: Soft. Bowel sounds are normal. She exhibits no distension. There is no tenderness.  Musculoskeletal: Normal range of motion. She exhibits no edema.  Neurological: She is alert and oriented to person, place, and time.  Skin: Skin is warm and dry. No rash noted.  Psychiatric: She has a normal mood and affect. Her behavior is normal.    ED Course  Procedures (including critical care time) Labs Review Labs Reviewed  CBC WITH DIFFERENTIAL - Abnormal; Notable for the following:    WBC 12.4 (*)    RBC 3.26 (*)    Hemoglobin 9.6 (*)    HCT 30.5 (*)    RDW 17.1 (*)    Neutrophils Relative % 90 (*)    Neutro Abs 11.1 (*)    Lymphocytes Relative 4 (*)    Lymphs Abs 0.5 (*)    All other components within normal limits  BASIC METABOLIC PANEL - Abnormal; Notable for the following:    Sodium 134 (*)    Chloride 93 (*)    Glucose, Bld 322 (*)    Creatinine, Ser 1.12 (*)    GFR calc non Af Amer 49 (*)    GFR calc Af Amer 57 (*)    All other components within normal limits  PRO B NATRIURETIC PEPTIDE - Abnormal; Notable for the following:    Pro B Natriuretic peptide (BNP) 8093.0 (*)    All other components  within normal limits  TROPONIN I   Imaging Review Dg Chest Port 1 View  01/26/2014   CLINICAL DATA:  Tachycardia, chest pressure, former smoker  EXAM: PORTABLE CHEST - 1 VIEW  COMPARISON:  DG CHEST 2 VIEW dated 01/22/2014  FINDINGS: Mild cardiac enlargement. Stable calcification of the aortic arch. Mild vascular congestion. No pulmonary edema or pleural effusion.  IMPRESSION: Stable cardiac enlargement with mild vascular congestion but no pulmonary edema.   Electronically Signed   By: Skipper Cliche M.D.   On: 01/26/2014 15:48  All radiology studies independently viewed by me.      EKG Interpretation   Date/Time:  Monday January 26 2014 15:07:52 EST Ventricular Rate:  139 PR Interval:    QRS Duration: 90 QT Interval:  322 QTC Calculation: 490 R Axis:   32 Text Interpretation:  Atrial  fibrillation Low voltage, precordial leads  RSR' in V1 or V2, right VCD or RVH Abnormal inferior Q waves Borderline  prolonged QT interval No significant change was found Confirmed by [facility]   MD, TREY ([id number]) on 01/26/2014 4:36:45 PM      MDM   Final diagnoses:  Paroxysmal a-fib  Acute on chronic diastolic congestive heart failure  Atrial fibrillation with RVR  Chronic anticoagulation  Chronic renal disease, stage III  COPD (chronic obstructive pulmonary disease) with emphysema  Morbid obesity    EKG shows A. fib with RVR. Patient reports being is tachycardic for past 2 days. Will start IV diltiazem drip.  Heart rate improved to 105-115 on diltiazem.  Consulted internal medicine for admission.  Will page cardiology also.    Houston Siren III, MD 01/27/14 [phone]

## 2014-01-26 NOTE — Progress Notes (Signed)
Per patient report- outpatient prednisone course was completed today. Dr. Candiss Norse gave telephone order to stop this therapy.  Sloan Leiter, PharmD, BCPS Clinical Pharmacist [phone] 01/26/2014, 7:00 PM

## 2014-01-26 NOTE — Progress Notes (Signed)
Notified K.Schorr, NP via text page of heart rate staying in 115-120s but frequently jumps up to 150-160s but nonsustains while on cardizem drip. Pt awakened by nurse and states she does not feel any palpitations or any tightness or chest pain when asked. Hospitalist ordered Metoprolol and will be given as ordered.

## 2014-01-26 NOTE — ED Notes (Signed)
Admitting MD at bedside.

## 2014-01-27 ENCOUNTER — Telehealth: Payer: Self-pay | Admitting: Nurse Practitioner

## 2014-01-27 LAB — CBC
HEMATOCRIT: 27.9 % — AB (ref 36.0–46.0)
Hemoglobin: 9 g/dL — ABNORMAL LOW (ref 12.0–15.0)
MCH: 30.2 pg (ref 26.0–34.0)
MCHC: 32.3 g/dL (ref 30.0–36.0)
MCV: 93.6 fL (ref 78.0–100.0)
Platelets: 189 10*3/uL (ref 150–400)
RBC: 2.98 MIL/uL — ABNORMAL LOW (ref 3.87–5.11)
RDW: 17 % — ABNORMAL HIGH (ref 11.5–15.5)
WBC: 9.6 10*3/uL (ref 4.0–10.5)

## 2014-01-27 LAB — BASIC METABOLIC PANEL
BUN: 19 mg/dL (ref 6–23)
CO2: 31 mEq/L (ref 19–32)
Calcium: 8.7 mg/dL (ref 8.4–10.5)
Chloride: 99 mEq/L (ref 96–112)
Creatinine, Ser: 1.19 mg/dL — ABNORMAL HIGH (ref 0.50–1.10)
GFR calc Af Amer: 53 mL/min — ABNORMAL LOW (ref >90)
GFR, EST NON AFRICAN AMERICAN: 46 mL/min — AB (ref >90)
GLUCOSE: 141 mg/dL — AB (ref 70–99)
POTASSIUM: 3.4 meq/L — AB (ref 3.7–5.3)
Sodium: 143 mEq/L (ref 137–147)

## 2014-01-27 LAB — HEMOGLOBIN A1C
Hgb A1c MFr Bld: 6.7 % — ABNORMAL HIGH (ref <5.7)
Mean Plasma Glucose: 146 mg/dL — ABNORMAL HIGH (ref <117)

## 2014-01-27 LAB — GLUCOSE, CAPILLARY
GLUCOSE-CAPILLARY: 157 mg/dL — AB (ref 70–99)
GLUCOSE-CAPILLARY: 177 mg/dL — AB (ref 70–99)
GLUCOSE-CAPILLARY: 235 mg/dL — AB (ref 70–99)
Glucose-Capillary: 242 mg/dL — ABNORMAL HIGH (ref 70–99)

## 2014-01-27 LAB — TSH: TSH: 1.384 u[IU]/mL (ref 0.350–4.500)

## 2014-01-27 MED ORDER — DILTIAZEM HCL ER COATED BEADS 300 MG PO CP24
300.0000 mg | ORAL_CAPSULE | Freq: Every day | ORAL | Status: DC
  Administered 2014-01-27 – 2014-01-28 (×2): 300 mg via ORAL
  Filled 2014-01-27 (×2): qty 1

## 2014-01-27 MED ORDER — FUROSEMIDE 10 MG/ML IJ SOLN
80.0000 mg | Freq: Once | INTRAMUSCULAR | Status: AC
Start: 2014-01-27 — End: 2014-01-27
  Administered 2014-01-27: 80 mg via INTRAVENOUS
  Filled 2014-01-27: qty 8

## 2014-01-27 MED ORDER — FUROSEMIDE 10 MG/ML IJ SOLN
80.0000 mg | Freq: Every day | INTRAMUSCULAR | Status: DC
  Administered 2014-01-28 – 2014-01-29 (×2): 80 mg via INTRAVENOUS
  Filled 2014-01-27 (×2): qty 8

## 2014-01-27 MED ORDER — POTASSIUM CHLORIDE CRYS ER 20 MEQ PO TBCR
40.0000 meq | EXTENDED_RELEASE_TABLET | Freq: Once | ORAL | Status: AC
  Administered 2014-01-27: 40 meq via ORAL
  Filled 2014-01-27: qty 2

## 2014-01-27 NOTE — Evaluation (Signed)
Physical Therapy Evaluation Patient Details Name: Paige Hardin MRN: [id number] DOB: 1946/06/12 Today's Date: 01/27/2014 Time: 3154-0086 PT Time Calculation (min): 26 min  PT Assessment / Plan / Recommendation History of Present Illness  Paige Hardin is a 68 y.o. female, followed by Dr. Aundra Dubin, with known history of COPD, diastolic CHF(EF of 76% on recent echo) and atrial fibrillation on Pradaxa. She was recently admitted to [facility] on 01/14/14 for COPD exacerbation, CHF and atrial fibrillation with RVR. Once stabilized, she was discharged to a SNF on 01/23/14. She was discharged on 40 mg of Lasix BID for CHF, 120 mg of Cardizem and 150 mg of Pradaxa for PAF. She was also prescribed a prednisone taper for her COPD exacerbation.   Clinical Impression  Pt was Mod I for sit to stand transfer. She has limited ambulation mobility due to SOB associated with cardiopulmonary deficits. Pt uses a RW and requires seated resting breaks after short distances (30 ft). Pt would benefit from continued therapy with LE strengthening. Pt is not safe to return home for Independent living. It is recommended that she should be d/c to a SNF.    PT Assessment  Patient needs continued PT services    Follow Up Recommendations  SNF    Does the patient have the potential to tolerate intense rehabilitation      Barriers to Discharge        Equipment Recommendations  Rolling walker with 5" wheels    Recommendations for Other Services  (SNF)   Frequency Min 2X/week    Precautions / Restrictions Precautions Precautions: Fall Precaution Comments: Monitor O2 sats on RA Restrictions Weight Bearing Restrictions: No   Pertinent Vitals/Pain Pt voiced no pn complaints      Mobility  Bed Mobility General bed mobility comments: did not assess. pt was seated EOB when PT entered  Transfers Overall transfer level: Modified independent Equipment used: Rolling walker (2 wheeled) Transfers: Sit to/from Stand Sit to Stand: Min  guard Ambulation/Gait Ambulation/Gait assistance: Min assist Ambulation Distance (Feet): 30 Feet (x2) Assistive device: Rolling walker (2 wheeled) Gait Pattern/deviations: Decreased stride length;Shuffle;Step-through pattern Gait velocity: decreased General Gait Details: Pt was reliant on RW. Pt required two rest seated breaks with short distance walking (30 ft). She did not use O2 during ambulation and it remained 95-98 during ambulation.     Exercises     PT Diagnosis: Difficulty walking;Generalized weakness  PT Problem List: Decreased strength;Decreased balance;Decreased mobility;Cardiopulmonary status limiting activity;Obesity;Decreased activity tolerance PT Treatment Interventions: DME instruction;Gait training;Stair training;Functional mobility training;Therapeutic activities;Therapeutic exercise;Balance training     PT Goals(Current goals can be found in the care plan section) Acute Rehab PT Goals Patient Stated Goal: return [facility] assisted living [facility] PT Goal Formulation: With patient Time For Goal Achievement: 02/03/14 Potential to Achieve Goals: Good Additional Goals Additional Goal #1: Pt will be able to tolerate 5 minutes of continuous LE strengthening exercises.  Visit Information  Last PT Received On: 01/27/14 Assistance Needed: +1 History of Present Illness: Paige Hardin is a 68 y.o. female, followed by Dr. Aundra Dubin, with known history of COPD, diastolic CHF(EF of 19% on recent echo) and atrial fibrillation on Pradaxa. She was recently admitted to [facility] on 01/14/14 for COPD exacerbation, CHF and atrial fibrillation with RVR. Once stabilized, she was discharged to a SNF on 01/23/14. She was discharged on 40 mg of Lasix BID for CHF, 120 mg of Cardizem and 150 mg of Pradaxa for PAF. She was also prescribed a prednisone taper for her  COPD exacerbation.        Prior [place] expects to be discharged to:: Skilled nursing facility Living  Arrangements: Alone Available Help at Discharge: [place] Type of Home: [place] Access: Stairs to enter Entrance Stairs-Number of Steps: 3 Entrance Stairs-Rails: Right Home Layout: One level;Laundry or work area in [place]: Kasandra Knudsen - single point Prior Function Level of Independence: Independent with assistive device(s) Comments: Pt limited short distance gait with exertion that would require seated rest breaks.  Communication Communication: No difficulties Dominant Hand: Right    Cognition  Cognition Arousal/Alertness: Awake/alert Behavior During Therapy: Flat affect Overall Cognitive Status: Within Functional Limits for tasks assessed    Extremity/Trunk Assessment Upper Extremity Assessment Upper Extremity Assessment: Generalized weakness Lower Extremity Assessment Lower Extremity Assessment: Generalized weakness   Balance Balance Overall balance assessment: Needs assistance Sitting-balance support: Single extremity supported Sitting balance-Leahy Scale: Fair Standing balance support: Bilateral upper extremity supported Standing balance-Leahy Scale: Poor General Comments General comments (skin integrity, edema, etc.): LE edema. PT removed pt's socks to prevent further swelling and pitting. LE skin appeared dry and ashy  End of Session PT - End of Session Equipment Utilized During Treatment: Gait belt (RW) Activity Tolerance:  (Pt limited to SOB) Patient left: in chair;with call bell/phone within reach Nurse Communication: Mobility status  GP     Annalese Stiner,AndrewSPT 01/27/2014, 4:19 PM

## 2014-01-27 NOTE — Progress Notes (Signed)
Patient's heart rate now in the 90-low100s.Patient is sleeping. Notified hospitalist on-call. Will continue to monitor to end of shift.

## 2014-01-27 NOTE — Telephone Encounter (Signed)
New problem   Pt has 7day TCM w/Lori 01/30/14 @ 3:30 per after hr vm.

## 2014-01-27 NOTE — Progress Notes (Signed)
    Subjective:  She says she feels better, still has hoarse cough.  Objective:  Vital Signs in the last 24 hours: Temp:  [97.7 F (36.5 C)-98.5 F (36.9 C)] 98.5 F (36.9 C) (03/03 0703) Pulse Rate:  [45-155] 92 (03/03 0703) Resp:  [15-29] 21 (03/03 0703) BP: (101-133)/(47-84) 101/47 mmHg (03/03 0703) SpO2:  [91 %-99 %] 95 % (03/03 0703) Weight:  [294 lb 6.4 oz (133.539 kg)-302 lb (136.986 kg)] 294 lb 6.4 oz (133.539 kg) (03/03 0703)  Intake/Output from previous day:  Intake/Output Summary (Last 24 hours) at 01/27/14 0917 Last data filed at 01/27/14 3875  Gross per 24 hour  Intake    360 ml  Output      0 ml  Net    360 ml    Physical Exam: General appearance: alert, cooperative and morbidly obese Lungs: decreased breath sounds  Heart: irregularly irregular rhythm   Rate: 120  Rhythm: atrial fibrillation  Lab Results:  Recent Labs  01/26/14 1517 01/27/14 0323  WBC 12.4* 9.6  HGB 9.6* 9.0*  PLT 211 189    Recent Labs  01/26/14 1517 01/27/14 0323  NA 134* 143  K 4.1 3.4*  CL 93* 99  CO2 30 31  GLUCOSE 322* 141*  BUN 19 19  CREATININE 1.12* 1.19*    Recent Labs  01/26/14 1517  TROPONINI <0.30   No results found for this basename: INR,  in the last 72 hours  Imaging: Imaging results have been reviewed  Cardiac Studies:  Assessment/Plan:   Principal Problem:   Acute respiratory failure Active Problems:   Acute on chronic diastolic congestive heart failure   Atrial fibrillation with RVR   COPD with emphysema    PAF (paroxysmal atrial fibrillation)   Pulmonary HTN- pa 53 mmHg Echo 01/15/14   Morbid obesity- BMI 50   Poorly controlled diabetes mellitus   Chronic renal disease, stage III   HYPERLIPIDEMIA-MIXED   CAROTID BRUIT   HTN (hypertension)    PLAN:  I can't see that she has Lasix ordered, she was discharged on 80 mg daily, will ordered Lasix 80 mg IV this am and daily. Will see how her HR does on increased PO Diltiazem.   Kerin Ransom PA-C [facility]per [phone] 01/27/2014, 9:17 AM  Personally seen and examined, agree with above.  Restarting lasix.  AFIB reasonable control.  Poor overall prognosis.   Candee Furbish, MD

## 2014-01-27 NOTE — Progress Notes (Signed)
Notified on-call hospitalist of potassium of 3.4 . Last night patient had cramps in left side and was given 2 packets of mustard but she refused any pain med. During day shift, was supplemented times one dose of potassium 23meq. Just wanted doctor to know for informational purposes. Will continue to monitor to end of shift.

## 2014-01-27 NOTE — Progress Notes (Signed)
UR completed Johnesha Acheampong K. Chaz Mcglasson, RN, BSN, MSHL, CCM  01/27/2014 1:57 PM

## 2014-01-27 NOTE — Progress Notes (Signed)
Inpatient Diabetes Program Recommendations  AACE/ADA: New Consensus Statement on Inpatient Glycemic Control (2013)  Target Ranges:  Prepandial:   less than 140 mg/dL      Peak postprandial:   less than 180 mg/dL (1-2 hours)      Critically ill patients:  140 - 180 mg/dL   Reason for Visit: Hyperglycemia  Diabetes history: DM2 Outpatient Diabetes medications: Glynase 3 mg Current orders for Inpatient glycemic control: Lantus 15 units QAM, Novolog sensitive tidwc, Glynase 3 mg QD  On Solumedrol  Inpatient Diabetes Program Recommendations Insulin - Meal Coverage: Consider addition of Novolog meal coverage insulin - Novolog 3 units tidwc if pt eats >50% meal HgbA1C: 6.1% controlled  Will follow. Thank you. Lorenda Peck, RD, LDN, CDE Inpatient Diabetes Coordinator [phone]

## 2014-01-27 NOTE — Evaluation (Signed)
Physical Therapy Evaluation Patient Details Name: Paige Hardin MRN: [id number] DOB: 1946/06/12 Today's Date: 01/27/2014 Time: 3154-0086 PT Time Calculation (min): 26 min  PT Assessment / Plan / Recommendation History of Present Illness  Paige Hardin is a 68 y.o. female, followed by Dr. Aundra Dubin, with known history of COPD, diastolic CHF(EF of 76% on recent echo) and atrial fibrillation on Pradaxa. She was recently admitted to [facility] on 01/14/14 for COPD exacerbation, CHF and atrial fibrillation with RVR. Once stabilized, she was discharged to a SNF on 01/23/14. She was discharged on 40 mg of Lasix BID for CHF, 120 mg of Cardizem and 150 mg of Pradaxa for PAF. She was also prescribed a prednisone taper for her COPD exacerbation.   Clinical Impression  Pt was Mod I for sit to stand transfer. She has limited ambulation mobility due to SOB associated with cardiopulmonary deficits. Pt uses a RW and requires seated resting breaks after short distances (30 ft). Pt would benefit from continued therapy with LE strengthening. Pt is not safe to return home for Independent living. It is recommended that she should be d/c to a SNF.    PT Assessment  Patient needs continued PT services    Follow Up Recommendations  SNF    Does the patient have the potential to tolerate intense rehabilitation      Barriers to Discharge        Equipment Recommendations  Rolling walker with 5" wheels    Recommendations for Other Services  (SNF)   Frequency Min 2X/week    Precautions / Restrictions Precautions Precautions: Fall Precaution Comments: Monitor O2 sats on RA Restrictions Weight Bearing Restrictions: No   Pertinent Vitals/Pain Pt voiced no pn complaints      Mobility  Bed Mobility General bed mobility comments: did not assess. pt was seated EOB when PT entered  Transfers Overall transfer level: Modified independent Equipment used: Rolling walker (2 wheeled) Transfers: Sit to/from Stand Sit to Stand: Min  guard Ambulation/Gait Ambulation/Gait assistance: Min assist Ambulation Distance (Feet): 30 Feet (x2) Assistive device: Rolling walker (2 wheeled) Gait Pattern/deviations: Decreased stride length;Shuffle;Step-through pattern Gait velocity: decreased General Gait Details: Pt was reliant on RW. Pt required two rest seated breaks with short distance walking (30 ft). She did not use O2 during ambulation and it remained 95-98 during ambulation.     Exercises     PT Diagnosis: Difficulty walking;Generalized weakness  PT Problem List: Decreased strength;Decreased balance;Decreased mobility;Cardiopulmonary status limiting activity;Obesity;Decreased activity tolerance PT Treatment Interventions: DME instruction;Gait training;Stair training;Functional mobility training;Therapeutic activities;Therapeutic exercise;Balance training     PT Goals(Current goals can be found in the care plan section) Acute Rehab PT Goals Patient Stated Goal: return to assisted living [facility] PT Goal Formulation: With patient Time For Goal Achievement: 02/03/14 Potential to Achieve Goals: Good Additional Goals Additional Goal #1: Pt will be able to tolerate 5 minutes of continuous LE strengthening exercises.  Visit Information  Last PT Received On: 01/27/14 Assistance Needed: +1 History of Present Illness: Paige Hardin is a 68 y.o. female, followed by Dr. Aundra Dubin, with known history of COPD, diastolic CHF(EF of 19% on recent echo) and atrial fibrillation on Pradaxa. She was recently admitted to [facility] on 01/14/14 for COPD exacerbation, CHF and atrial fibrillation with RVR. Once stabilized, she was discharged to a SNF on 01/23/14. She was discharged on 40 mg of Lasix BID for CHF, 120 mg of Cardizem and 150 mg of Pradaxa for PAF. She was also prescribed a prednisone taper for her  COPD exacerbation.        Prior [place] expects to be discharged to:: Skilled nursing facility Living  Arrangements: Alone Available Help at Discharge: [place] Type of Home: [place] Access: Stairs to enter Entrance Stairs-Number of Steps: 3 Entrance Stairs-Rails: Right Home Layout: One level;Laundry or work area in [place]: Kasandra Knudsen - single point Prior Function Level of Independence: Independent with assistive device(s) Comments: Pt limited short distance gait with exertion that would require seated rest breaks.  Communication Communication: No difficulties Dominant Hand: Right    Cognition  Cognition Arousal/Alertness: Awake/alert Behavior During Therapy: Flat affect Overall Cognitive Status: Within Functional Limits for tasks assessed    Extremity/Trunk Assessment Upper Extremity Assessment Upper Extremity Assessment: Generalized weakness Lower Extremity Assessment Lower Extremity Assessment: Generalized weakness   Balance Balance Overall balance assessment: Needs assistance Sitting-balance support: Single extremity supported Sitting balance-Leahy Scale: Fair Standing balance support: Bilateral upper extremity supported Standing balance-Leahy Scale: Poor General Comments General comments (skin integrity, edema, etc.): LE edema. PT removed pt's socks to prevent further swelling and pitting. LE skin appeared dry and ashy  End of Session PT - End of Session Equipment Utilized During Treatment: Gait belt (RW) Activity Tolerance:  (Pt limited to SOB) Patient left: in chair;with call bell/phone within reach Nurse Communication: Mobility status  GP     Kohler,AndrewSPT 01/27/2014, 4:19 PM  Agree with above assessment.  Kittie Plater, PT, DPT Pager #: [phone] Office #: [phone]

## 2014-01-27 NOTE — Care Management Note (Addendum)
  Page 1 of 1   01/27/2014     10:34:21 AM   CARE MANAGEMENT NOTE 01/27/2014  Patient:  Paige, Hardin   Account Number:  [id number]  Date Initiated:  01/27/2014  Documentation initiated by:  Verdie Wilms  Subjective/Objective Assessment:   Admitted with A-Fib     Action/Plan:   CM to follow for disposition needs   Anticipated DC Date:  01/27/2014   Anticipated DC Plan:  SKILLED NURSING FACILITY  In-house referral  Clinical Social Worker      DC Planning Services  CM consult      Choice offered to / List presented to:             Status of service:  Completed, signed off Medicare Important Message given?   (If response is "NO", the following Medicare IM given date fields will be blank) Date Medicare IM given:   Date Additional Medicare IM given:    Discharge Disposition:    Per UR Regulation:  Reviewed for med. necessity/level of care/duration of stay  If discussed at [place] of Stay Meetings, dates discussed:    Comments:  01/27/2014 Admitted 01/26/2014 with A-fib Socail:  From SNF/[facility] - previous d/c date 01/23/2014 Patient request to return back to [facility].  (SW notified) Plan for today:  Cardizem gtt - may change to po today, IV Lasix, oxygen Disposition Plan:  SNF ADD: [address] [place] RN, BSN, [place], CCM 01/27/2014

## 2014-01-27 NOTE — Progress Notes (Signed)
Patient Demographics  Paige Hardin, is a 68 y.o. female, DOB - September 05, 1946, [id number]  Admit date - 01/26/2014   Admitting Physician Thurnell Lose, MD  Outpatient Primary MD for the patient is Stephens Shire, MD  LOS - 1   Chief Complaint  Patient presents with  . Irregular Heart Beat  . Atrial Fibrillation        Assessment & Plan     1. A. fib and RVR in a patient with history of paroxysmal atrial fibrillation. In rate control on telemetry, to be symptom-free, continue IV Cardizem drip and give her higher than home dose oral Cardizem, gradually titrate off IV Cardizem as tolerated per parameters, continue Peridex of her anticoagulation. Recent echo noted with a preserved EF of 60% and chronic diastolic CHF. PendingTSH. Cardiology consulted and following     2. COPD with recent exacerbation. No wheezing on exam, supportive care with oxygen and nebulizer treatments as needed, she's not on home oxygen. As finished her recent prednisone taper.   3. Pulmonary hypertension. No acute issues oxygen and applies the treatments as needed outpatient but may follow post discharge.     4. Diabetes mother has type II. Check A1c, continue oral hypoglycemic from home, had sliding scale insulin and Lantus for better control sugars here are around 350.   Lab Results  Component Value Date   HGBA1C 6.1* 01/15/2014    CBG (last 3)   Recent Labs  01/26/14 2230 01/27/14 0658  GLUCAP 206* 157*       5. Essential hypertension. No acute issues home medications will be continued which include Cardizem.     6. GERD continue PPI.     7. Dyslipidemia continue Vytorin.     8. Chronic grade 2 diastolic dysfunction with EF 60% on recent echo gram. Compensated continue Lasix at home dose. Monitor  salt and fluid intake and daily weights.         Code Status: DNR  Family Communication:    Disposition Plan: Home   Procedures   Recent Echo 2015  - Left ventricle: The cavity size was mildly dilated. Wall thickness was increased in a pattern of mild LVH. The estimated ejection fraction was 60%. Wall motion was normal; there were no regional wall motion abnormalities. Findings consistent with left ventricular diastolic dysfunction. Doppler parameters are consistent with high ventricular filling pressure. - Aortic valve: Sclerosis without stenosis. No significant regurgitation. - Mitral valve: Mildly calcified annulus. Mildly thickened leaflets . Mild regurgitation. - Left atrium: The atrium was mildly to moderately dilated. - Right atrium: The atrium was mildly dilated. - Pulmonary arteries: PA peak pressure: 60mm Hg (S). - Inferior vena cava: The vessel was dilated; the respirophasic diameter changes were blunted (< 50%); findings are consistent with elevated central venous pressure.    Consults  Cards   Medications  Scheduled Meds: . benzonatate  100 mg Oral TID  . budesonide-formoterol  2 puff Inhalation BID  . cholecalciferol  1,000 Units Oral Daily  . dabigatran  150 mg Oral BID  . diltiazem  300 mg Oral Daily  . docusate sodium  100 mg Oral BID  . ezetimibe-simvastatin  1 tablet Oral QHS  . glyBURIDE micronized  3 mg Oral Daily  .  insulin aspart  0-5 Units Subcutaneous QHS  . insulin aspart  0-9 Units Subcutaneous TID WC  . insulin glargine  15 Units Subcutaneous Q24H  . ipratropium-albuterol  3 mL Nebulization BID  . pantoprazole  40 mg Oral Daily  . potassium chloride SA  20 mEq Oral Daily  . potassium chloride  40 mEq Oral Once  . sodium chloride  3 mL Intravenous Q12H  . tiotropium  18 mcg Inhalation Daily  . vitamin B-12  500 mcg Oral Daily  . vitamin E  400 Units Oral Daily   Continuous Infusions: . diltiazem (CARDIZEM) infusion 15 mg/hr  (01/27/14 0436)   PRN Meds:.acetaminophen, albuterol, bisacodyl, guaiFENesin-codeine, guaiFENesin-dextromethorphan, HYDROcodone-acetaminophen, ondansetron (ZOFRAN) IV, ondansetron, polyethylene glycol  DVT Prophylaxis Pradaxa  Lab Results  Component Value Date   PLT 189 01/27/2014    Antibiotics    Anti-infectives   None          Subjective:   Paige Hardin today has, No headache, No chest pain, no palpitations today, No abdominal pain - No Nausea, No new weakness tingling or numbness, No Cough - SOB.    Objective:   Filed Vitals:   01/26/14 2114 01/26/14 2231 01/27/14 0033 01/27/14 0703  BP:  110/64 111/53 101/47  Pulse:  135 124 92  Temp:  97.9 F (36.6 C) 97.7 F (36.5 C) 98.5 F (36.9 C)  TempSrc:  Oral Oral Oral  Resp:  20 20 21   Height:      Weight:    133.539 kg (294 lb 6.4 oz)  SpO2: 97% 96% 98% 95%    Wt Readings from Last 3 Encounters:  01/27/14 133.539 kg (294 lb 6.4 oz)  01/23/14 134.718 kg (297 lb)  10/07/13 144.425 kg (318 lb 6.4 oz)     Intake/Output Summary (Last 24 hours) at 01/27/14 0850 Last data filed at 01/27/14 0838  Gross per 24 hour  Intake    360 ml  Output      0 ml  Net    360 ml     Physical Exam  Awake Alert, Oriented X 3, No new F.N deficits, Normal affect [place].AT,PERRAL Supple Neck,No JVD, No cervical lymphadenopathy appriciated.  Symmetrical Chest wall movement, Good air movement bilaterally, CTAB iRRR,No Gallops,Rubs or new Murmurs, No Parasternal Heave +ve B.Sounds, Abd Soft, Non tender, No organomegaly appriciated, No rebound - guarding or rigidity. No Cyanosis, Clubbing or edema, No new Rash or bruise      Data Review   Micro Results Recent Results (from the past 240 hour(s))  MRSA PCR SCREENING     Status: None   Collection Time    01/26/14  8:00 PM      Result Value Ref Range Status   MRSA by PCR NEGATIVE  NEGATIVE Final   Comment:            The GeneXpert MRSA Assay (FDA     approved for NASAL specimens      only), is one component of a     comprehensive MRSA colonization     surveillance program. It is not     intended to diagnose MRSA     infection nor to guide or     monitor treatment for     MRSA infections.    Radiology Reports Dg Chest 2 View  01/22/2014   CLINICAL DATA:  Productive cough, possible pneumonia  EXAM: CHEST  2 VIEW  COMPARISON:  01/14/2014  FINDINGS: Cardiomediastinal silhouette is stable. No acute infiltrate or pleural  effusion. No pulmonary edema. Osteopenia and mild degenerative changes thoracic spine. Atherosclerotic calcifications thoracic aorta.  IMPRESSION: No active cardiopulmonary disease.   Electronically Signed   By: Lahoma Crocker M.D.   On: 01/22/2014 11:45   Dg Chest Port 1 View  01/26/2014   CLINICAL DATA:  Tachycardia, chest pressure, former smoker  EXAM: PORTABLE CHEST - 1 VIEW  COMPARISON:  DG CHEST 2 VIEW dated 01/22/2014  FINDINGS: Mild cardiac enlargement. Stable calcification of the aortic arch. Mild vascular congestion. No pulmonary edema or pleural effusion.  IMPRESSION: Stable cardiac enlargement with mild vascular congestion but no pulmonary edema.   Electronically Signed   By: Skipper Cliche M.D.   On: 01/26/2014 15:48   Dg Chest Portable 1 View  01/14/2014   CLINICAL DATA:  Worsening dyspnea, history of emphysema and CHF  EXAM: PORTABLE CHEST - 1 VIEW  COMPARISON:  CT CHEST W/O CM dated 09/09/2010  FINDINGS: The lungs are well-expanded. The interstitial markings are mildly increased. The pulmonary vascularity is prominent centrally and there is mild cephalization. The cardiac silhouette is mildly enlarged. There is no pleural effusion. There is no alveolar infiltrate. The mediastinum is normal in width. There is tortuosity of the descending thoracic aorta. The observed portions of the bony thorax appear normal.  IMPRESSION: Findings are consistent with CHF superimposed upon COPD. There is no alveolar pneumonia.   Electronically Signed   By: David  [place]    On: 01/14/2014 16:43    CBC  Recent Labs Lab 01/22/14 0344 01/26/14 1517 01/27/14 0323  WBC 10.8* 12.4* 9.6  HGB 8.3* 9.6* 9.0*  HCT 25.7* 30.5* 27.9*  PLT 206 211 189  MCV 91.8 93.6 93.6  MCH 29.6 29.4 30.2  MCHC 32.3 31.5 32.3  RDW 16.8* 17.1* 17.0*  LYMPHSABS  --  0.5*  --   MONOABS  --  0.6  --   EOSABS  --  0.1  --   BASOSABS  --  0.0  --     Chemistries   Recent Labs Lab 01/21/14 0400 01/22/14 0344 01/23/14 0642 01/26/14 1517 01/27/14 0323  NA 136* 136* 140 134* 143  K 4.1 4.4 4.0 4.1 3.4*  CL 95* 95* 98 93* 99  CO2 28 29 32 30 31  GLUCOSE 112* 146* 91 322* 141*  BUN 63* 49* 33* 19 19  CREATININE 1.66* 1.38* 1.08 1.12* 1.19*  CALCIUM 8.0* 7.9* 8.4 8.8 8.7   ------------------------------------------------------------------------------------------------------------------ estimated creatinine clearance is 62.6 ml/min (by C-G formula based on Cr of 1.19). ------------------------------------------------------------------------------------------------------------------ No results found for this basename: HGBA1C,  in the last 72 hours ------------------------------------------------------------------------------------------------------------------ No results found for this basename: CHOL, HDL, LDLCALC, TRIG, CHOLHDL, LDLDIRECT,  in the last 72 hours ------------------------------------------------------------------------------------------------------------------ No results found for this basename: TSH, T4TOTAL, FREET3, T3FREE, THYROIDAB,  in the last 72 hours ------------------------------------------------------------------------------------------------------------------ No results found for this basename: VITAMINB12, FOLATE, FERRITIN, TIBC, IRON, RETICCTPCT,  in the last 72 hours  Coagulation profile No results found for this basename: INR, PROTIME,  in the last 168 hours  No results found for this basename: DDIMER,  in the last 72 hours  Cardiac  Enzymes  Recent Labs Lab 01/26/14 1517  TROPONINI <0.30   ------------------------------------------------------------------------------------------------------------------ No components found with this basename: POCBNP,      Time Spent in minutes   35   SINGH,PRASHANT K M.D on 01/27/2014 at 8:50 AM  Between 7am to 7pm - Pager - [phone]  After 7pm go to [email or web address] - password [email or web address]  And look  for the night coverage person covering for me after hours  [facility] Office  [phone]

## 2014-01-27 NOTE — Progress Notes (Signed)
Blood pressure  111/53, HR - 124. Metoprolol given per doctor's order- one time dose via IV push. HR now in the 80's to 119s without spiking up to 160. Notified and updated hospitalist of heart rate. Patient continues to be asymptomatic. Will contiue to monitor patient to end of shift.

## 2014-01-28 LAB — GLUCOSE, CAPILLARY
GLUCOSE-CAPILLARY: 104 mg/dL — AB (ref 70–99)
Glucose-Capillary: 151 mg/dL — ABNORMAL HIGH (ref 70–99)
Glucose-Capillary: 257 mg/dL — ABNORMAL HIGH (ref 70–99)
Glucose-Capillary: 163 mg/dL — ABNORMAL HIGH (ref 70–99)

## 2014-01-28 LAB — BASIC METABOLIC PANEL
BUN: 22 mg/dL (ref 6–23)
CALCIUM: 8.9 mg/dL (ref 8.4–10.5)
CO2: 29 mEq/L (ref 19–32)
Chloride: 98 mEq/L (ref 96–112)
Creatinine, Ser: 1.31 mg/dL — ABNORMAL HIGH (ref 0.50–1.10)
GFR, EST AFRICAN AMERICAN: 47 mL/min — AB (ref >90)
GFR, EST NON AFRICAN AMERICAN: 41 mL/min — AB (ref >90)
Glucose, Bld: 153 mg/dL — ABNORMAL HIGH (ref 70–99)
POTASSIUM: 3.8 meq/L (ref 3.7–5.3)
SODIUM: 139 meq/L (ref 137–147)

## 2014-01-28 LAB — MAGNESIUM: MAGNESIUM: 1.9 mg/dL (ref 1.5–2.5)

## 2014-01-28 MED ORDER — POTASSIUM CHLORIDE CRYS ER 20 MEQ PO TBCR
40.0000 meq | EXTENDED_RELEASE_TABLET | Freq: Once | ORAL | Status: AC
  Administered 2014-01-28: 40 meq via ORAL
  Filled 2014-01-28: qty 2

## 2014-01-28 MED ORDER — LEVALBUTEROL HCL 0.63 MG/3ML IN NEBU: 0.6300 mg | INHALATION_SOLUTION | Freq: Four times a day (QID) | RESPIRATORY_TRACT | Status: DC | PRN

## 2014-01-28 MED ORDER — FLECAINIDE ACETATE 50 MG PO TABS
50.0000 mg | ORAL_TABLET | Freq: Two times a day (BID) | ORAL | Status: DC
  Administered 2014-01-28 – 2014-01-29 (×3): 50 mg via ORAL
  Filled 2014-01-28 (×4): qty 1

## 2014-01-28 NOTE — Progress Notes (Addendum)
Patient Name: Paige Hardin Date of Encounter: 01/28/2014    Active Problems:   Acute on chronic diastolic congestive heart failure   Atrial fibrillation with RVR   COPD with emphysema    Pulmonary HTN- pa 53 mmHg Echo 01/15/14   Morbid obesity- BMI 50   Poorly controlled diabetes mellitus   Chronic renal disease, stage III   HYPERLIPIDEMIA-MIXED   CAROTID BRUIT   HTN (hypertension)   SUBJECTIVE  Wt down to 294, below prev d/c weight - dry weight not clear as she still has volume overload.  Breathing improved.  HR's still 1-teens to 130's.  CURRENT MEDS . benzonatate  100 mg Oral TID  . budesonide-formoterol  2 puff Inhalation BID  . cholecalciferol  1,000 Units Oral Daily  . dabigatran  150 mg Oral BID  . diltiazem  300 mg Oral Daily  . docusate sodium  100 mg Oral BID  . ezetimibe-simvastatin  1 tablet Oral QHS  . furosemide  80 mg Intravenous Daily  . glyBURIDE micronized  3 mg Oral Daily  . insulin aspart  0-5 Units Subcutaneous QHS  . insulin aspart  0-9 Units Subcutaneous TID WC  . insulin glargine  15 Units Subcutaneous Q24H  . pantoprazole  40 mg Oral Daily  . potassium chloride SA  20 mEq Oral Daily  . sodium chloride  3 mL Intravenous Q12H  . tiotropium  18 mcg Inhalation Daily  . vitamin B-12  500 mcg Oral Daily  . vitamin E  400 Units Oral Daily   OBJECTIVE  Filed Vitals:   01/27/14 1613 01/27/14 2047 01/27/14 2212 01/28/14 0957  BP:   102/47 119/48  Pulse:   113 115  Temp:   98.1 F (36.7 C) 98 F (36.7 C)  TempSrc:   Oral Oral  Resp:   20 20  Height:      Weight:      SpO2: 96% 97% 98% 93%    Intake/Output Summary (Last 24 hours) at 01/28/14 1056 Last data filed at 01/28/14 1000  Gross per 24 hour  Intake    723 ml  Output    500 ml  Net    223 ml   Filed Weights   01/26/14 1506 01/26/14 1842 01/27/14 0703  Weight: 302 lb (136.986 kg) 295 lb 13.7 oz (134.2 kg) 294 lb 6.4 oz (133.539 kg)   PHYSICAL EXAM  General: Pleasant, NAD. Neuro:  Alert and oriented X 3. Moves all extremities spontaneously. Psych: Normal affect. HEENT:  [place], AT.  Neck: Supple without bruits.  Mod elev JVP. Carotid upstroke 2+. Large lipoma on posterior neck.  Lungs:  Resp regular and unlabored, few crackles bilat bases.  Heart: IR, IR no s3, s4, soft systolic murmur @ usb. Cap refill =3 sec.  Abdomen: Protuberant. Soft, non-tender, non-distended, BS + x 4.  Extremities: Edema 2+ pitting to bilat LE. Chronic venous stasis.  No clubbing, or cyanosis.  DP/PT/Radials 2+ and equal bilaterally.  Accessory Clinical Findings  CBC  Recent Labs  01/26/14 1517 01/27/14 0323  WBC 12.4* 9.6  NEUTROABS 11.1*  --   HGB 9.6* 9.0*  HCT 30.5* 27.9*  MCV 93.6 93.6  PLT 211 528   Basic Metabolic Panel  Recent Labs  01/27/14 0323 01/28/14 0419  NA 143 139  K 3.4* 3.8  CL 99 98  CO2 31 29  GLUCOSE 141* 153*  BUN 19 22  CREATININE 1.19* 1.31*  CALCIUM 8.7 8.9  MG  --  1.9  Cardiac Enzymes  Recent Labs  01/26/14 1517  TROPONINI <0.30   Hemoglobin A1C  Recent Labs  01/27/14 0323  HGBA1C 6.7*   Thyroid Function Tests  Recent Labs  01/27/14 0323  TSH 1.384   TELE  Atrial fibrillation, HR 130-140.    ECG 01/26/2014: A-fib with RVR, Q waves in inferior leads     Radiology/Studies  Dg Chest Port 1 View  01/26/2014   CLINICAL DATA:  Tachycardia, chest pressure, former smoker  EXAM: PORTABLE CHEST - 1 VIEW  COMPARISON:  DG CHEST 2 VIEW dated 01/22/2014  FINDINGS: Mild cardiac enlargement. Stable calcification of the aortic arch. Mild vascular congestion. No pulmonary edema or pleural effusion.  IMPRESSION: Stable cardiac enlargement with mild vascular congestion but no pulmonary edema.   Electronically Signed   By: Skipper Cliche M.D.   On: 01/26/2014 15:48   ASSESSMENT AND PLAN  1. A-fib RVR/PAF: Pt presented with volume overload and diast CHF in the setting of recurrent AF RVR.  She was in afib on her last admission but converted  spontaneously and left the hospital in sinus rhythm.  She clearly does not tolerate afib.  She remains on IV dilt and rates remain in the 1-teens to occas 130's.  She has been chronically anticoagulated on Pradaxa.  TSH wnl.  At this point, I think that we need to consider initiation of antiarrhythmic therapy and possible DCCV if she doesn't convert.  She does not have a h/o CAD and has nl LV fxn.  Flecainide is an option.  Could also consider tikosyn (though she does have CKD II-III - Cr Cl 86.5 based on today's creat and her current weight).  Will discuss with Dr. Marlou Porch.  With h/o lung dzs amio not ideal.  With CHF sotalol/multaq not options.  2. Diastolic CHF, acute on chronic (EF 60%)/Acute on chronic respiratory failure:  She is feeling/breathing better.  Wt is down 1 lb since admission (presuming 302 was inaccurate).  Lasix resumed yesterday and she cont to have volume overload.  She isn't sure what her dry weight is but looking at her trends, she's not far from it.  She was 290 back in 10/2012.  Cont IV lasix today.  So long as she is tachycardic and in afib, this will be an issue.  See above re: rhythm mgmt.  3.  Stage II-III CKD:  Stable.  Follow with diuresis.  4.  HTN:  Stable.   5.  COPD:  Per IM.  6.  Morbid obesity:  Would benefit from nutritional counseling.  7. Hypokalemia:  Supp.  8.  Normocytic anemia:  Stable.   Signed, Murray Hodgkins, NP 1:14 PM 01/28/2014  Personally seen and examined. Agree with above.  Will start Flecainide 50 BID. No history of CAD. There has not been acute respiratory failure on this admit.    Candee Furbish, MD

## 2014-01-28 NOTE — Telephone Encounter (Signed)
Left pt a message to call back. 

## 2014-01-28 NOTE — Clinical Documentation Improvement (Signed)
"  Acute Respiratory Failure" documented in Cardiology Progress Note 01/27/2014  "Acute Respiratory Failure" noted on Hospital Problem from previous admission 01/14/14  Please clarify if the diagnosis of Acute Respiratory Failure, including clinical indicators, is applicable to this current admission.   Thank You,  Erling Conte ,RN BSN CCDS Certified Clinical Documentation Specialist:  [facility]

## 2014-01-28 NOTE — Progress Notes (Signed)
Note: This document was prepared with digital dictation and possible smart phrase technology. Any transcriptional errors that result from this process are unintentional.   CAROLIN Hardin ATF:[id number] DOB: 08/20/1946 DOA: 01/26/2014 PCP: Stephens Shire, MD  Brief narrative: 68 y/o ? paroxysmal atrial fibrillation CHad2Vasc2 score=4 on Pradaxa status post cardioversion 12/28/1998 and , COPD Gold stage 'c' per PFT 2010-prior tobacco use, pulmonary hypertension, PA peak 53 mmHg based on echo 2/19, acute/ chronic diastolic heart failure EF around 60%-negative LEXiscan 10/11/2010, chronic low back pain,  dyslipidemia, Morbid obesity, Body mass index is 48.99 kg/(m^2). negative screening for obstructive sleep apnea, mild ICA stenosis, history pulmonary nodules, progressive anemia secondary to hemorrhoids, diverticular disease , polyps [multiple adenomas 06/2006] Moderate controlled type 2 diabetes mellitus-A1c 6.7  Recent admission 2/18-2/27 multifactorial respiratory failure COPD >CHF, A. fib with RVR readmitted 01/26/14 with atrial fibrillation with RVR-placed on Cardizem drip   Past medical history-As per Problem list Chart reviewed as below- Reviewed  Consultants:  Cardiology  Procedures:  Portal chest x-ray 3/2  Antibiotics:  None currently   Subjective  States that she is doing well. Shortness of breath seemed to resolve No chest pain, and no vomiting, still feels slightly swollen and lower extremity is swollen bilaterally Past urine about 3 times last night Patient on oxygen however does not seem to require it No stool as yet Eating and drinking    Objective    Interim History: None  Telemetry:  fibrillation rate controlled to a rate of 120   Objective: Filed Vitals:   01/27/14 1432 01/27/14 1613 01/27/14 2047 01/27/14 2212  BP: 101/60   102/47  Pulse: 72   113  Temp: 97.7 F (36.5 C)   98.1 F (36.7 C)  TempSrc: Oral   Oral  Resp: 20   20  Height:      Weight:       SpO2: 95% 96% 97% 98%    Intake/Output Summary (Last 24 hours) at 01/28/14 0943 Last data filed at 01/28/14 0900  Gross per 24 hour  Intake    723 ml  Output    500 ml  Net    223 ml    Exam:  General: Morbid obesity, Body mass index is 48.99 kg/(m^2).\ pleasant oriented no apparent distress currently talking in full sentences Cardiovascular: S1-S2 irregularly irregular no murmur, cannot appreciate JVD secondary to habitus Respiratory: Clinically clear no wheeze no rails no rhonchi Abdomen: Morbidly obese Skin grade 2 lower extremity edema Neuro grossly intact moving all 4 limbs equally  Data Reviewed: Basic Metabolic Panel:  Recent Labs Lab 01/22/14 0344 01/23/14 0642 01/26/14 1517 01/27/14 0323 01/28/14 0419  NA 136* 140 134* 143 139  K 4.4 4.0 4.1 3.4* 3.8  CL 95* 98 93* 99 98  CO2 29 32 30 31 29   GLUCOSE 146* 91 322* 141* 153*  BUN 49* 33* 19 19 22   CREATININE 1.38* 1.08 1.12* 1.19* 1.31*  CALCIUM 7.9* 8.4 8.8 8.7 8.9  MG  --   --   --   --  1.9   Liver Function Tests: No results found for this basename: AST, ALT, ALKPHOS, BILITOT, PROT, ALBUMIN,  in the last 168 hours No results found for this basename: LIPASE, AMYLASE,  in the last 168 hours No results found for this basename: AMMONIA,  in the last 168 hours CBC:  Recent Labs Lab 01/22/14 0344 01/26/14 1517 01/27/14 0323  WBC 10.8* 12.4* 9.6  NEUTROABS  --  11.1*  --  HGB 8.3* 9.6* 9.0*  HCT 25.7* 30.5* 27.9*  MCV 91.8 93.6 93.6  PLT 206 211 189   Cardiac Enzymes:  Recent Labs Lab 01/26/14 1517  TROPONINI <0.30   BNP: No components found with this basename: POCBNP,  CBG:  Recent Labs Lab 01/27/14 0658 01/27/14 1055 01/27/14 1607 01/27/14 2121 01/28/14 0542  GLUCAP 157* 242* 177* 235* 151*    Recent Results (from the past 240 hour(s))  MRSA PCR SCREENING     Status: None   Collection Time    01/26/14  8:00 PM      Result Value Ref Range Status   MRSA by PCR NEGATIVE  NEGATIVE  Final   Comment:            The GeneXpert MRSA Assay (FDA     approved for NASAL specimens     only), is one component of a     comprehensive MRSA colonization     surveillance program. It is not     intended to diagnose MRSA     infection nor to guide or     monitor treatment for     MRSA infections.     Studies:              All Imaging reviewed and is as per above notation   Scheduled Meds: . benzonatate  100 mg Oral TID  . budesonide-formoterol  2 puff Inhalation BID  . cholecalciferol  1,000 Units Oral Daily  . dabigatran  150 mg Oral BID  . diltiazem  300 mg Oral Daily  . docusate sodium  100 mg Oral BID  . ezetimibe-simvastatin  1 tablet Oral QHS  . furosemide  80 mg Intravenous Daily  . glyBURIDE micronized  3 mg Oral Daily  . insulin aspart  0-5 Units Subcutaneous QHS  . insulin aspart  0-9 Units Subcutaneous TID WC  . insulin glargine  15 Units Subcutaneous Q24H  . ipratropium-albuterol  3 mL Nebulization BID  . pantoprazole  40 mg Oral Daily  . potassium chloride SA  20 mEq Oral Daily  . sodium chloride  3 mL Intravenous Q12H  . tiotropium  18 mcg Inhalation Daily  . vitamin B-12  500 mcg Oral Daily  . vitamin E  400 Units Oral Daily   Continuous Infusions: . diltiazem (CARDIZEM) infusion 10 mg/hr (01/28/14 [id number])     Assessment/Plan: 1. Afib, CHad2Vasc2 score=4-defer to cardiology further management. Note that she is on Cardizem drip at 10mg /hr but will also receive 300 mg of long-acting Cardizem today.  ? Might need digoxin/amiodarone-she also has concomitant lung disease which may limit choices.  She has been cardioverted in 2010 successfully. Continue Pradaxa Dr. 150 twice a day 2. Multifactorial dyspnea secondary likely to Acute decompensated diastolic dysfunction-continue diuresis-strict I.'s and O.'s are limited by the fact that patient has now foley. Baseline weight is 263 she states overweight on discharge from hospital 2/26 = 303 pounds. Currently 294  pounds. Unclear why discrepancy. 3. Stage C. COPD Gold-continue dulera, substitute Xopenex for albuterol and discontinue DuoNeb, continue tiotropium 18 mcg.  She does not have sputum and is not overtly coughing, but she does have stable COPD.  We will get the saturation screen. She has been told in the past she might need oxygen by her primary pulmonologist Dr. Gwenette Greet. 4. Stage I chronic kidney disease-diuresis needed at this point. Monitor labs in a.m. 5. Pulmonary arterial hypertension group 2-might benefit from nitrates/hydralazine.  Would defer further management to both cardiology  and pulmonology as an outpatient.  Eventually she may benefit from left and right-sided heart cath is thought reasonable 6. Hypokalemia-potassium being replaced 20 mEq orally 7. Diabetes mellitus, moderate control-A1c recently 6.2. Discontinue glyburide, continue Lantus 15 units, 3 times a day a.c. at bedtime coverage 8. Carotid bruit interval screening recommended as an outpatient 9. Pulmonary nodules interval screening recommended as an outpatient with low-dose CT 10. Morbid obesity, Body mass index is 48.99 kg/(m^2). her most life threatening-illness-probably contributing to restrictive component to her COPD as well. She will need dietary input, nutritional input as an outpatient 11. Likely osteoporosis-consider outpatient vitamin D level-continue cholecalciferol 1000 units daily 12. Dyslipidemia-continue Vytorin 13. Constipation, continue Dulcolax and Colace 14. GERD continue pantoprazole 40 daily 15. Borderline microcytic anemia-MCV 93. Continue vitamin B12. Has history of polyps 16. History of colonic polyps, diverticular disease-outpatient followup-supposed to have colonoscopy at some point with Dr. Benson Norway  Code Status: Full code  Family Communication: None at bedside  Disposition Plan: Inpatient pending resolution of age of ablation and further plans as per cardiology   70 minutes  Verneita Griffes, MD  [facility] Pager [phone] 01/28/2014, 9:43 AM    LOS: 2 days

## 2014-01-28 NOTE — Progress Notes (Signed)
Pt converted to NSR in the 70s from a fib at 8pm tonight. EKG confirms rhythm change. Cardizem drip decreased from 10 mL/hr to 67mL/hr. RN gave pt her second dose of Flecainide (ordered AM of 01/28/14). MD made aware. MD instructed RN to check pt's HR/BP at 11pm and call back with results. Will continue to monitor.

## 2014-01-29 DIAGNOSIS — I4891 Unspecified atrial fibrillation: Secondary | ICD-10-CM | POA: Diagnosis not present

## 2014-01-29 DIAGNOSIS — I10 Essential (primary) hypertension: Secondary | ICD-10-CM | POA: Diagnosis not present

## 2014-01-29 DIAGNOSIS — Z7901 Long term (current) use of anticoagulants: Secondary | ICD-10-CM | POA: Diagnosis not present

## 2014-01-29 DIAGNOSIS — I5033 Acute on chronic diastolic (congestive) heart failure: Secondary | ICD-10-CM | POA: Diagnosis not present

## 2014-01-29 DIAGNOSIS — R262 Difficulty in walking, not elsewhere classified: Secondary | ICD-10-CM | POA: Diagnosis not present

## 2014-01-29 DIAGNOSIS — E1129 Type 2 diabetes mellitus with other diabetic kidney complication: Secondary | ICD-10-CM | POA: Diagnosis not present

## 2014-01-29 DIAGNOSIS — R0602 Shortness of breath: Secondary | ICD-10-CM | POA: Diagnosis not present

## 2014-01-29 DIAGNOSIS — J449 Chronic obstructive pulmonary disease, unspecified: Secondary | ICD-10-CM | POA: Diagnosis not present

## 2014-01-29 DIAGNOSIS — I5032 Chronic diastolic (congestive) heart failure: Secondary | ICD-10-CM | POA: Diagnosis not present

## 2014-01-29 DIAGNOSIS — R279 Unspecified lack of coordination: Secondary | ICD-10-CM | POA: Diagnosis not present

## 2014-01-29 DIAGNOSIS — I509 Heart failure, unspecified: Secondary | ICD-10-CM | POA: Diagnosis not present

## 2014-01-29 DIAGNOSIS — M6281 Muscle weakness (generalized): Secondary | ICD-10-CM | POA: Diagnosis not present

## 2014-01-29 DIAGNOSIS — I5043 Acute on chronic combined systolic (congestive) and diastolic (congestive) heart failure: Secondary | ICD-10-CM | POA: Diagnosis not present

## 2014-01-29 DIAGNOSIS — N189 Chronic kidney disease, unspecified: Secondary | ICD-10-CM | POA: Diagnosis not present

## 2014-01-29 DIAGNOSIS — J4489 Other specified chronic obstructive pulmonary disease: Secondary | ICD-10-CM | POA: Diagnosis not present

## 2014-01-29 DIAGNOSIS — N183 Chronic kidney disease, stage 3 unspecified: Secondary | ICD-10-CM | POA: Diagnosis not present

## 2014-01-29 DIAGNOSIS — E119 Type 2 diabetes mellitus without complications: Secondary | ICD-10-CM | POA: Diagnosis not present

## 2014-01-29 DIAGNOSIS — J9620 Acute and chronic respiratory failure, unspecified whether with hypoxia or hypercapnia: Secondary | ICD-10-CM | POA: Diagnosis not present

## 2014-01-29 DIAGNOSIS — I2789 Other specified pulmonary heart diseases: Secondary | ICD-10-CM | POA: Diagnosis not present

## 2014-01-29 DIAGNOSIS — J9600 Acute respiratory failure, unspecified whether with hypoxia or hypercapnia: Secondary | ICD-10-CM | POA: Diagnosis not present

## 2014-01-29 DIAGNOSIS — K219 Gastro-esophageal reflux disease without esophagitis: Secondary | ICD-10-CM | POA: Diagnosis not present

## 2014-01-29 LAB — COMPREHENSIVE METABOLIC PANEL
ALT: 17 U/L (ref 0–35)
AST: 13 U/L (ref 0–37)
Albumin: 2.7 g/dL — ABNORMAL LOW (ref 3.5–5.2)
Alkaline Phosphatase: 43 U/L (ref 39–117)
BUN: 20 mg/dL (ref 6–23)
CALCIUM: 8.7 mg/dL (ref 8.4–10.5)
CO2: 27 meq/L (ref 19–32)
CREATININE: 1.29 mg/dL — AB (ref 0.50–1.10)
Chloride: 98 mEq/L (ref 96–112)
GFR, EST AFRICAN AMERICAN: 48 mL/min — AB (ref >90)
GFR, EST NON AFRICAN AMERICAN: 42 mL/min — AB (ref >90)
GLUCOSE: 79 mg/dL (ref 70–99)
Potassium: 3.9 mEq/L (ref 3.7–5.3)
Sodium: 138 mEq/L (ref 137–147)
Total Bilirubin: 0.5 mg/dL (ref 0.3–1.2)
Total Protein: 5.5 g/dL — ABNORMAL LOW (ref 6.0–8.3)

## 2014-01-29 LAB — MAGNESIUM: Magnesium: 1.9 mg/dL (ref 1.5–2.5)

## 2014-01-29 LAB — GLUCOSE, CAPILLARY
GLUCOSE-CAPILLARY: 87 mg/dL (ref 70–99)
Glucose-Capillary: 168 mg/dL — ABNORMAL HIGH (ref 70–99)

## 2014-01-29 MED ORDER — FUROSEMIDE 40 MG PO TABS: 40.0000 mg | ORAL_TABLET | Freq: Two times a day (BID) | ORAL | Status: DC

## 2014-01-29 MED ORDER — FLECAINIDE ACETATE 50 MG PO TABS: 50.0000 mg | ORAL_TABLET | Freq: Two times a day (BID) | ORAL | Status: DC

## 2014-01-29 MED ORDER — DILTIAZEM HCL ER COATED BEADS 300 MG PO CP24: 300.0000 mg | ORAL_CAPSULE | Freq: Every day | ORAL | Status: DC

## 2014-01-29 MED ORDER — FUROSEMIDE 40 MG PO TABS
40.0000 mg | ORAL_TABLET | Freq: Two times a day (BID) | ORAL | Status: DC
  Filled 2014-01-29 (×2): qty 1

## 2014-01-29 MED ORDER — DILTIAZEM HCL ER COATED BEADS 300 MG PO CP24
300.0000 mg | ORAL_CAPSULE | Freq: Every day | ORAL | Status: DC
  Administered 2014-01-29: 300 mg via ORAL
  Filled 2014-01-29: qty 1

## 2014-01-29 NOTE — Telephone Encounter (Signed)
Left pt a message to call back. 

## 2014-01-29 NOTE — Clinical Social Work Psychosocial (Addendum)
Clinical Social Work Department BRIEF PSYCHOSOCIAL ASSESSMENT 01/29/2014  Patient:  Paige Hardin, Paige Hardin     Account Number:  [id number]     Admit date:  01/26/2014  Clinical Social Worker:  Iona Coach  Date/Time:  01/27/2014 11:44 AM  Referred by:  Physician  Date Referred:  01/27/2014 Referred for  Other - See comment   Other Referral:   Return to SNF   Interview type:  Patient Other interview type:    PSYCHOSOCIAL DATA Living Status:  FACILITY Admitted from facility:  [facility] Level of care:  [place] Primary support name:  Jenny Reichmann  [phone] Primary support relationship to patient:  FRIEND Degree of support available:   Strong support    *Husband  Johnny Aylesworth    CURRENT CONCERNS Current Concerns  Post-Acute Placement   Other Concerns:   Return to SNF    SOCIAL WORK ASSESSMENT / PLAN 68 year old female- current resident of [place] met with patient- she states that she plans to return to facility when medically stable.  CSW spoke with Narda Rutherford, Admissions at [facility]. She stated that patient's bed is being held at the facility.  Fl2 completed and placed on chart for MD's signature.  CSW will monitor for tentative date of d/c.   Assessment/plan status:  Psychosocial Support/Ongoing Assessment of Needs Other assessment/ plan:   Information/referral to community resources:   None at this time.    PATIENT'S/FAMILY'S RESPONSE TO PLAN OF CARE: Patient is alert and oriented;  plans to return to facility when medically stable.  She states that she is happy and adjusted at facility and denies any concerns or problems. She states that her friend [name] helps her with personal business as needed but she handles her own affairs.  CSW provided support and will assist with d/c back to SNF when medically stable per MD.  Butch Penny T. [place], [place]

## 2014-01-29 NOTE — Progress Notes (Signed)
Cardiologist: Dr. Aundra Dubin.  Subjective:  Feels better in NSR. No CP, no SOB.   Objective:  Vital Signs in the last 24 hours: Temp:  [97.9 F (36.6 C)-98.7 F (37.1 C)] 97.9 F (36.6 C) (03/05 0548) Pulse Rate:  [78-80] 80 (03/05 0548) Resp:  [20-22] 21 (03/05 0548) BP: (109-125)/(40-56) 120/56 mmHg (03/05 1001) SpO2:  [92 %-97 %] 97 % (03/05 0548) Weight:  [297 lb 3.2 oz (134.809 kg)] 297 lb 3.2 oz (134.809 kg) (03/05 0548)  Intake/Output from previous day: 03/04 0701 - 03/05 0700 In: 723 [P.O.:720; I.V.:3] Out: 850 [Urine:850]   Physical Exam: General: Well developed, well nourished, in no acute distress. Head:  Normocephalic and atraumatic. Lungs: Clear to auscultation and percussion. Heart: Normal S1 and S2.  No murmur, rubs or gallops.  Abdomen: soft, non-tender, positive bowel sounds. obese Extremities: No clubbing or cyanosis. No edema. Neurologic: Alert and oriented x 3.    Lab Results:  Recent Labs  01/26/14 1517 01/27/14 0323  WBC 12.4* 9.6  HGB 9.6* 9.0*  PLT 211 189    Recent Labs  01/28/14 0419 01/29/14 0521  NA 139 138  K 3.8 3.9  CL 98 98  CO2 29 27  GLUCOSE 153* 79  BUN 22 20  CREATININE 1.31* 1.29*    Recent Labs  01/26/14 1517  TROPONINI <0.30   Hepatic Function Panel  Recent Labs  01/29/14 0521  PROT 5.5*  ALBUMIN 2.7*  AST 13  ALT 17  ALKPHOS 43  BILITOT 0.5    Telemetry: NSR Personally viewed.   EKG:  NSR, normal QRS duration. On Flecainide.   Cardiac Studies:  EF 60% . benzonatate  100 mg Oral TID  . budesonide-formoterol  2 puff Inhalation BID  . cholecalciferol  1,000 Units Oral Daily  . dabigatran  150 mg Oral BID  . diltiazem  300 mg Oral Daily  . docusate sodium  100 mg Oral BID  . ezetimibe-simvastatin  1 tablet Oral QHS  . flecainide  50 mg Oral Q12H  . furosemide  80 mg Intravenous Daily  . glyBURIDE micronized  3 mg Oral Daily  . insulin aspart  0-5 Units Subcutaneous QHS  . insulin aspart  0-9  Units Subcutaneous TID WC  . insulin glargine  15 Units Subcutaneous Q24H  . pantoprazole  40 mg Oral Daily  . potassium chloride SA  20 mEq Oral Daily  . sodium chloride  3 mL Intravenous Q12H  . tiotropium  18 mcg Inhalation Daily  . vitamin B-12  500 mcg Oral Daily  . vitamin E  400 Units Oral Daily    Assessment/Plan:  Principal Problem:   Atrial fibrillation with RVR Active Problems:   HYPERLIPIDEMIA-MIXED   COPD with emphysema    CAROTID BRUIT   Acute on chronic diastolic congestive heart failure   HTN (hypertension)   Pulmonary HTN- pa 53 mmHg Echo 01/15/14   Morbid obesity- BMI 50   Poorly controlled diabetes mellitus   Chronic renal disease, stage III  1) PAF  - now in NSR  - feels better  - Continue with low dose flecainide 50mg  PO BID  - Dilt 300  - I would be comfortable with her being DC'd back to SNF.  - We would normally test her on treadmill with Flecainide however we are unable due to morbid obesity and conditioning. Will monitor with ECG.   2) Acute on chronic diastolic HF  - improved  - Wt at baseline.   - I  will change to PO lasix 40mg  BID  - Recommend BMET in one week.   3) Morbid obesity  - encourage wt loss.   4) CKD 3  - stable  Will get follow up with Dr. Aundra Dubin in one to 2 weeks.   Paige Hardin, [place] 01/29/2014, 12:31 PM

## 2014-01-29 NOTE — Progress Notes (Signed)
Ok per MD for d/c today back to [facility] via EMS. OK per patient and her friend [name] who will come up to the hospital and pack up her belongings.  Patients is pleased to be returning to facility today. Nursing notified to call report.  No further CSW needs identified. CSW signing off.  Lorie Phenix. [place], [place]

## 2014-01-29 NOTE — Discharge Summary (Signed)
Physician Discharge Summary  Paige Hardin MWU:[id number] DOB: 14-Jul-1946 DOA: 01/26/2014  PCP: Stephens Shire, MD  Admit date: 01/26/2014 Discharge date: 01/29/2014  Time spent: 25 minutes  Recommendations for Outpatient Follow-up:  1. Needs daily weights, reevaluation of Lasix dose as an outpatient-increased this admission to 40 mg twice daily 2. Consider renal panel, CBC in about one week 3. Consider outpatient colonoscopy/evaluation by gastroenterology Dr. Benson Norway 4. Transitional care appointment for management of atrial fibrillation/congestive heart failure has been arranged and skilled nursing facility should be notified  5.  patient's obesity is life-threatening-she will need weight loss aggressively as an outpatient 6. Consider outpatient pulmonary visit for group '2' pulmonary hypertension-will forward a note to her lung doctor 7. Consider outpatient low-dose CT scan chest for pulmonary nodules  8.  seen by physical therapy and they recommended skilled nursing continuation given she was moderate assistance from sitting to standing and needed rest breaks after short distances [30 feet] 9. Lipid panel in 3 months  Discharge Diagnoses:  Principal Problem:   Atrial fibrillation with RVR Active Problems:   HYPERLIPIDEMIA-MIXED   COPD with emphysema    CAROTID BRUIT   Acute on chronic diastolic congestive heart failure   HTN (hypertension)   Pulmonary HTN- pa 53 mmHg Echo 01/15/14   Morbid obesity- BMI 50   Poorly controlled diabetes mellitus   Chronic renal disease, stage III   Discharge Condition: Stable   Diet recommendation: Heart healthy low-salt diabetic   Filed Weights   01/26/14 1842 01/27/14 0703 01/29/14 0548  Weight: 134.2 kg (295 lb 13.7 oz) 133.539 kg (294 lb 6.4 oz) 134.809 kg (297 lb 3.2 oz)    History of present illness:   68 y/o ? paroxysmal atrial fibrillation CHad2Vasc2 score=4 on Pradaxa status post cardioversion 12/28/1998 and , COPD Gold stage 'c' per PFT  2010-prior tobacco use, pulmonary hypertension, PA peak 53 mmHg based on echo 2/19, acute/ chronic diastolic heart failure EF around 60%-negative LEXiscan 10/11/2010, chronic low back pain, dyslipidemia, Morbid obesity, Body mass index is 48.99 kg/(m^2). negative screening for obstructive sleep apnea, mild ICA stenosis, history pulmonary nodules, progressive anemia secondary to hemorrhoids, diverticular disease , polyps [multiple adenomas 06/2006]  Moderate controlled type 2 diabetes mellitus-A1c 6.7  Recent admission 2/18-2/27 multifactorial respiratory failure COPD >CHF, A. fib with RVR readmitted 01/26/14 with atrial fibrillation with RVR-placed on Cardizem drip   Hospital Course:   1. Afib, CHad2Vasc2 score=4-was initially on Cardizem gtt.  Transitioned by Cardiology to flecainide 50 bid under supervision from Cardiology.  Continue increased dose Cardizem Cd 300 daily.  TCM appt set up soon with Cardiology 2. Multifactorial dyspnea secondary likely to Acute decompensated diastolic dysfunction-continue diuresis-She is net 1.4 liters neg this admission and her diuretics were increased to 40 bid on d/c home 3. Stage C. COPD Gold-continue dulera, substitute Xopenex for albuterol and discontinue DuoNeb, continue tiotropium 18 mcg. She does not have sputum and is not overtly coughing, but she does have stable COPD. She has been told in the past she might need oxygen by her primary pulmonologist Dr. Coralie Carpen she did not qualify for oxygen from a COPD standpoint but she may benefit from continuous oxygen from a pulmonary arterial hypertension standpoint and this should be discussed with her in the outpatient setting with pulmonology-I will follow copy of this note to him-I discontinued the patient's prednisone as she has diabetes and is no longer wheezing  4. Stage I chronic kidney disease-diuresis needed at this point. she has stable creatinine on  discharge with GFR 42 , BUN/creatinine 20/1.29  5. Pulmonary  arterial hypertension group 2-might benefit from nitrates/hydralazine. Would defer further management to both cardiology and pulmonology as an outpatient. Eventually she may benefit from left and right-sided heart cath is thought reasonable 6. Hypokalemia-potassium being replaced 20 mEq orally-this is chronic and will need to continue  7. Diabetes mellitus, moderate control-A1c recently 6.2.  restarted glyburide on discharge-was on sliding scale insulin during hospital stay with moderate to good control  8. Carotid bruit-interval screening recommended as an outpatient 9. Pulmonary nodules interval screening recommended as an outpatient with low-dose CT 10. Morbid obesity, Body mass index is 48.99 kg/(m^2). her most life threatening-illness-probably contributing to restrictive component to her COPD as well. She will need dietary input, nutritional input as an outpatient 11. Likely osteoporosis-consider outpatient vitamin D level-continue cholecalciferol 1000 units daily 12. Dyslipidemia-continue Vytorin 13. Constipation, continue Dulcolax and Colace-this was discontinued as an outpatient because it was thought that patient would not benefit from this and is having regular stools this can be reevaluated 14. GERD continue pantoprazole 40 daily 15. Borderline microcytic anemia-MCV 93. Continue vitamin B12. Has history of polyps 16. History of colonic polyps, diverticular disease-outpatient followup-supposed to have colonoscopy at some point with Dr. Benson Norway   Consultants:  Cardiology Procedures:  Portal chest x-ray 3/2 Antibiotics:  None currently   Discharge Exam: Filed Vitals:   01/29/14 1001  BP: 120/56  Pulse:   Temp:   Resp:     alert pleasant oriented no apparent distress   General:  EOMI, obese, pleasant  Cardiovascular:  S1-S2 no murmur rub or gallop  Respiratory: Clinically clear   Discharge Instructions   Future Appointments Provider Department Dept Phone   01/30/2014 3:30 PM  Burtis Junes, NP [place] [facility] [phone]   04/07/2014 1:45 PM Kathee Delton, MD [facility] [phone]       Medication List    STOP taking these medications       benzonatate 100 MG capsule  Commonly known as:  TESSALON     DSS 100 MG Caps     polyethylene glycol packet  Commonly known as:  MIRALAX / GLYCOLAX     predniSONE 10 MG tablet  Commonly known as:  DELTASONE      TAKE these medications       acetaminophen 325 MG tablet  Commonly known as:  TYLENOL  Take 325 mg by mouth daily as needed for mild pain.     albuterol 108 (90 BASE) MCG/ACT inhaler  Commonly known as:  PROAIR HFA  Inhale 2 puffs into the lungs every 6 (six) hours as needed for shortness of breath.     bisacodyl 10 MG suppository  Commonly known as:  DULCOLAX  Place 1 suppository (10 mg total) rectally daily as needed for moderate constipation.     budesonide-formoterol 80-4.5 MCG/ACT inhaler  Commonly known as:  SYMBICORT  Inhale 2 puffs into the lungs 2 (two) times daily.     cholecalciferol 1000 UNITS tablet  Commonly known as:  VITAMIN D  Take 1,000 Units by mouth daily.     dabigatran 150 MG Caps capsule  Commonly known as:  PRADAXA  Take 1 capsule (150 mg total) by mouth 2 (two) times daily.     diltiazem 300 MG 24 hr capsule  Commonly known as:  CARDIZEM CD  Take 1 capsule (300 mg total) by mouth daily.     ezetimibe-simvastatin 10-40 MG per tablet  Commonly known as:  VYTORIN  Take 1 tablet by mouth at bedtime.     flecainide 50 MG tablet  Commonly known as:  TAMBOCOR  Take 1 tablet (50 mg total) by mouth every 12 (twelve) hours.     furosemide 40 MG tablet  Commonly known as:  LASIX  Take 1 tablet (40 mg total) by mouth 2 (two) times daily.     glyBURIDE micronized 3 MG tablet  Commonly known as:  GLYNASE  Take 3 mg by mouth daily.     guaiFENesin-codeine 100-10 MG/5ML syrup  Take 5 mLs by mouth every 6 (six) hours as needed for  cough.     ipratropium-albuterol 0.5-2.5 (3) MG/3ML Soln  Commonly known as:  DUONEB  Take 3 mLs by nebulization 2 (two) times daily.     MULTIVITAMIN/IRON PO  Take 1 tablet by mouth daily.     OVER THE COUNTER MEDICATION  Take 1 tablet by mouth daily. Calcium 333 mg + Zinc 5 mg + Magnesium 133 mg     pantoprazole 40 MG tablet  Commonly known as:  PROTONIX  Take 1 tablet (40 mg total) by mouth daily.     potassium chloride SA 20 MEQ tablet  Commonly known as:  K-DUR,KLOR-CON  Take 20 mEq by mouth daily.     tiotropium 18 MCG inhalation capsule  Commonly known as:  SPIRIVA  Place 18 mcg into inhaler and inhale daily.     vitamin B-12 500 MCG tablet  Commonly known as:  CYANOCOBALAMIN  Take 500 mcg by mouth daily.     Vitamin C 500 MG Caps  Take 1 tablet by mouth daily.     vitamin E 400 UNIT capsule  Take 400 Units by mouth daily.       Allergies  Allergen Reactions  . Ace Inhibitors Cough  . Clindamycin Rash      The results of significant diagnostics from this hospitalization (including imaging, microbiology, ancillary and laboratory) are listed below for reference.    Significant Diagnostic Studies: Dg Chest 2 View  01/22/2014   CLINICAL DATA:  Productive cough, possible pneumonia  EXAM: CHEST  2 VIEW  COMPARISON:  01/14/2014  FINDINGS: Cardiomediastinal silhouette is stable. No acute infiltrate or pleural effusion. No pulmonary edema. Osteopenia and mild degenerative changes thoracic spine. Atherosclerotic calcifications thoracic aorta.  IMPRESSION: No active cardiopulmonary disease.   Electronically Signed   By: Lahoma Crocker M.D.   On: 01/22/2014 11:45   Dg Chest Port 1 View  01/26/2014   CLINICAL DATA:  Tachycardia, chest pressure, former smoker  EXAM: PORTABLE CHEST - 1 VIEW  COMPARISON:  DG CHEST 2 VIEW dated 01/22/2014  FINDINGS: Mild cardiac enlargement. Stable calcification of the aortic arch. Mild vascular congestion. No pulmonary edema or pleural effusion.   IMPRESSION: Stable cardiac enlargement with mild vascular congestion but no pulmonary edema.   Electronically Signed   By: Skipper Cliche M.D.   On: 01/26/2014 15:48   Dg Chest Portable 1 View  01/14/2014   CLINICAL DATA:  Worsening dyspnea, history of emphysema and CHF  EXAM: PORTABLE CHEST - 1 VIEW  COMPARISON:  CT CHEST W/O CM dated 09/09/2010  FINDINGS: The lungs are well-expanded. The interstitial markings are mildly increased. The pulmonary vascularity is prominent centrally and there is mild cephalization. The cardiac silhouette is mildly enlarged. There is no pleural effusion. There is no alveolar infiltrate. The mediastinum is normal in width. There is tortuosity of the descending thoracic aorta. The observed  portions of the bony thorax appear normal.  IMPRESSION: Findings are consistent with CHF superimposed upon COPD. There is no alveolar pneumonia.   Electronically Signed   By: David  [place]   On: 01/14/2014 16:43    Microbiology: Recent Results (from the past 240 hour(s))  MRSA PCR SCREENING     Status: None   Collection Time    01/26/14  8:00 PM      Result Value Ref Range Status   MRSA by PCR NEGATIVE  NEGATIVE Final   Comment:            The GeneXpert MRSA Assay (FDA     approved for NASAL specimens     only), is one component of a     comprehensive MRSA colonization     surveillance program. It is not     intended to diagnose MRSA     infection nor to guide or     monitor treatment for     MRSA infections.     Labs: Basic Metabolic Panel:  Recent Labs Lab 01/23/14 0642 01/26/14 1517 01/27/14 0323 01/28/14 0419 01/29/14 0521  NA 140 134* 143 139 138  K 4.0 4.1 3.4* 3.8 3.9  CL 98 93* 99 98 98  CO2 32 30 31 29 27   GLUCOSE 91 322* 141* 153* 79  BUN 33* 19 19 22 20   CREATININE 1.08 1.12* 1.19* 1.31* 1.29*  CALCIUM 8.4 8.8 8.7 8.9 8.7  MG  --   --   --  1.9 1.9   Liver Function Tests:  Recent Labs Lab 01/29/14 0521  AST 13  ALT 17  ALKPHOS 43  BILITOT  0.5  PROT 5.5*  ALBUMIN 2.7*   No results found for this basename: LIPASE, AMYLASE,  in the last 168 hours No results found for this basename: AMMONIA,  in the last 168 hours CBC:  Recent Labs Lab 01/26/14 1517 01/27/14 0323  WBC 12.4* 9.6  NEUTROABS 11.1*  --   HGB 9.6* 9.0*  HCT 30.5* 27.9*  MCV 93.6 93.6  PLT 211 189   Cardiac Enzymes:  Recent Labs Lab 01/26/14 1517  TROPONINI <0.30   BNP: BNP (last 3 results)  Recent Labs  01/14/14 1705 01/19/14 1200 01/26/14 1517  PROBNP 4957.0* 2192.0* 8093.0*   CBG:  Recent Labs Lab 01/28/14 1135 01/28/14 1700 01/28/14 2136 01/29/14 0559 01/29/14 1211  GLUCAP 257* 104* 163* 87 168*       Signed:  Nita Sells  [facility] 01/29/2014, 12:56 PM

## 2014-01-29 NOTE — Progress Notes (Signed)
Pt d/c to [facility] via ambulance.  Attempted to call report unable to get through at this time.  Will try again.  Karie Kirks, Therapist, sports.

## 2014-01-29 NOTE — Progress Notes (Signed)
Unable to get nurse to take report.  Asked to speak with supervisor, Dollene Cleveland,  Who took report.  Verbalized understanding.   Karie Kirks, Therapist, sports.

## 2014-01-30 ENCOUNTER — Encounter: Payer: Medicare Other | Admitting: Nurse Practitioner

## 2014-01-30 DIAGNOSIS — J449 Chronic obstructive pulmonary disease, unspecified: Secondary | ICD-10-CM | POA: Diagnosis not present

## 2014-01-30 DIAGNOSIS — N189 Chronic kidney disease, unspecified: Secondary | ICD-10-CM | POA: Diagnosis not present

## 2014-01-30 DIAGNOSIS — J9620 Acute and chronic respiratory failure, unspecified whether with hypoxia or hypercapnia: Secondary | ICD-10-CM | POA: Diagnosis not present

## 2014-01-30 DIAGNOSIS — K219 Gastro-esophageal reflux disease without esophagitis: Secondary | ICD-10-CM | POA: Diagnosis not present

## 2014-01-30 DIAGNOSIS — I4891 Unspecified atrial fibrillation: Secondary | ICD-10-CM | POA: Diagnosis not present

## 2014-01-30 DIAGNOSIS — E1129 Type 2 diabetes mellitus with other diabetic kidney complication: Secondary | ICD-10-CM | POA: Diagnosis not present

## 2014-01-30 DIAGNOSIS — I10 Essential (primary) hypertension: Secondary | ICD-10-CM | POA: Diagnosis not present

## 2014-01-30 NOTE — Telephone Encounter (Signed)
Left message on patient's home phone that if she can't keep appointment with Truitt Merle at 330pm today to call back to reschedule.

## 2014-02-11 DIAGNOSIS — J449 Chronic obstructive pulmonary disease, unspecified: Secondary | ICD-10-CM | POA: Diagnosis not present

## 2014-02-11 DIAGNOSIS — I10 Essential (primary) hypertension: Secondary | ICD-10-CM | POA: Diagnosis not present

## 2014-02-11 DIAGNOSIS — I5043 Acute on chronic combined systolic (congestive) and diastolic (congestive) heart failure: Secondary | ICD-10-CM | POA: Diagnosis not present

## 2014-02-11 DIAGNOSIS — J9600 Acute respiratory failure, unspecified whether with hypoxia or hypercapnia: Secondary | ICD-10-CM | POA: Diagnosis not present

## 2014-02-12 ENCOUNTER — Encounter: Payer: Self-pay | Admitting: Physician Assistant

## 2014-02-12 ENCOUNTER — Ambulatory Visit (INDEPENDENT_AMBULATORY_CARE_PROVIDER_SITE_OTHER): Payer: Medicare Other | Admitting: Physician Assistant

## 2014-02-12 VITALS — BP 140/60 | HR 89 | Ht 65.0 in | Wt 299.0 lb

## 2014-02-12 DIAGNOSIS — I4891 Unspecified atrial fibrillation: Secondary | ICD-10-CM | POA: Diagnosis not present

## 2014-02-12 DIAGNOSIS — N189 Chronic kidney disease, unspecified: Secondary | ICD-10-CM

## 2014-02-12 DIAGNOSIS — I10 Essential (primary) hypertension: Secondary | ICD-10-CM | POA: Diagnosis not present

## 2014-02-12 DIAGNOSIS — I5032 Chronic diastolic (congestive) heart failure: Secondary | ICD-10-CM | POA: Diagnosis not present

## 2014-02-12 NOTE — Progress Notes (Signed)
[place], [address], [place]  [address] Phone: [phone] Fax:  [phone]  Date:  02/12/2014   ID:  Paige Hardin, DOB 04-Nov-1946, MRN [id number]  PCP:  Stephens Shire, MD  Cardiologist:  Dr. Loralie Champagne     History of Present Illness: Paige Hardin is a 68 y.o. female with a history of COPD, diastolic CHF, atrial fibrillation, HTN, HL, CKD. She was admitted last month with acute on chronic diastolic CHF in the setting of COPD exacerbation and atrial fibrillation with RVR.  Patient was discharged to SNF.  She was readmitted earlier this month with recurrent atrial fibrillation with RVR.  It was felt that she could not tolerate atrial fibrillation. Given no history of documented CAD, she was placed on low-dose flecainide.  She converted to NSR. It was felt that she would not be able to undergo stress testing on a treadmill to r/po pro-arrhythmia due to morbid obesity.  She is staying at [facility]. Her weights have been stable. She did have one episode of rapid heart beats since discharge. After taking her Flecainide the next morning, she felt much better. She denies any recurrent palpitations. She is working with physical therapy. She denies any exertional chest discomfort. She has chronic dyspnea secondary to COPD. She is NYHA class 2b-3. She denies any significant change. She sleeps on an incline chronically. She denies PND. LE edema is improved.  Myoview (10/11/10):  Inf and apical thinning, no ischemia. Echo (01/15/14): Mild LVH, EF 60%, no RWMA, diastolic dysfunction, aortic sclerosis without stenosis, MAC, mild MR, mild to moderate LAE, mild RAE, PASP 53.   Recent Labs: 01/26/2014: Pro B Natriuretic peptide (BNP) 8093.0*  01/27/2014: Hemoglobin 9.0*; TSH 1.384  01/29/2014: ALT 17; Creatinine 1.29*; Potassium 3.9   Wt Readings from Last 3 Encounters:  01/29/14 297 lb 3.2 oz (134.809 kg)  01/23/14 297 lb (134.718 kg)  10/07/13 318 lb 6.4 oz (144.425 kg)     Past  Medical History  Diagnosis Date  . Chronic low back pain   . Emphysema   . Paroxysmal atrial fibrillation   . COPD (chronic obstructive pulmonary disease)   . Hyperlipidemia   . Diastolic congestive heart failure   . Obese   . Carotid atherosclerosis   . Lung nodules   . Hoarseness, chronic   . Pneumonia 1990's    "once"  . Type 2 diabetes mellitus   . GERD (gastroesophageal reflux disease)   . Arthritis     "joints" (01/14/2014)  . Depression     "maybe" (01/14/2014    Current Outpatient Prescriptions  Medication Sig Dispense Refill  . acetaminophen (TYLENOL) 325 MG tablet Take 325 mg by mouth daily as needed for mild pain.       Marland Kitchen albuterol (PROAIR HFA) 108 (90 BASE) MCG/ACT inhaler Inhale 2 puffs into the lungs every 6 (six) hours as needed for shortness of breath.  1 Inhaler  3  . Ascorbic Acid (VITAMIN C) 500 MG CAPS Take 1 tablet by mouth daily.      . bisacodyl (DULCOLAX) 10 MG suppository Place 1 suppository (10 mg total) rectally daily as needed for moderate constipation.  12 suppository  0  . budesonide-formoterol (SYMBICORT) 80-4.5 MCG/ACT inhaler Inhale 2 puffs into the lungs 2 (two) times daily.  1 Inhaler  5  . Ca & Phos-Vit D-Mag (CALCIUM) [phone] TABS Take by mouth.      . Cholecalciferol (VITAMIN D) 2000 UNITS tablet Take 2,000 Units by mouth  daily.      . dabigatran (PRADAXA) 150 MG CAPS capsule Take 1 capsule (150 mg total) by mouth 2 (two) times daily.  60 capsule    . diltiazem (CARDIZEM CD) 300 MG 24 hr capsule Take 1 capsule (300 mg total) by mouth daily.  30 capsule  0  . ezetimibe-simvastatin (VYTORIN) 10-40 MG per tablet Take 1 tablet by mouth at bedtime.       . flecainide (TAMBOCOR) 50 MG tablet Take 1 tablet (50 mg total) by mouth every 12 (twelve) hours.  60 tablet  0  . furosemide (LASIX) 40 MG tablet Take 1 tablet (40 mg total) by mouth 2 (two) times daily.  60 tablet  0  . glyBURIDE micronized (GLYNASE) 3 MG tablet Take 3 mg by mouth daily.         Marland Kitchen guaiFENesin-codeine 100-10 MG/5ML syrup Take 5 mLs by mouth every 6 (six) hours as needed for cough.  120 mL  0  . ipratropium-albuterol (DUONEB) 0.5-2.5 (3) MG/3ML SOLN Take 3 mLs by nebulization 2 (two) times daily.  360 mL    . Magnesium-Zinc 133.33-5 MG TABS Take 133 mg by mouth daily.      . Multiple Vitamins-Iron (MULTIVITAMIN/IRON PO) Take 1 tablet by mouth daily.      . Multiple Vitamins-Minerals (ZINC PO) Take 5 mg by mouth daily.      . pantoprazole (PROTONIX) 40 MG tablet Take 1 tablet (40 mg total) by mouth daily.      . potassium chloride SA (K-DUR,KLOR-CON) 20 MEQ tablet Take 20 mEq by mouth daily.       Marland Kitchen tiotropium (SPIRIVA) 18 MCG inhalation capsule Place 18 mcg into inhaler and inhale daily.      . vitamin B-12 (CYANOCOBALAMIN) 500 MCG tablet Take 500 mcg by mouth daily.      . vitamin E 400 UNIT capsule Take 400 Units by mouth daily.       No current facility-administered medications for this visit.    Allergies:   Ace inhibitors and Clindamycin   Social History:  The patient  reports that she quit smoking about 5 years ago. Her smoking use included Cigarettes. She has a 135 pack-year smoking history. She has never used smokeless tobacco. She reports that she drinks alcohol. She reports that she does not use illicit drugs.   Family History:  The patient's family history includes Asthma in her mother; Breast cancer in her sister; Heart disease in an other family member; Hodgkin's lymphoma in an other family member.   ROS:  Please see the history of present illness.   She has a nonproductive cough.   All other systems reviewed and negative.   PHYSICAL EXAM: VS:  BP 140/60  Pulse 89  Ht 5\' 5"  (1.651 m)  Wt 299 lb (135.626 kg)  BMI 49.76 kg/m2 Well nourished, well developed, in no acute distress HEENT: normal Neck: no JVDat 90 Cardiac:  normal S1, S2; RRR; 1/6 systolic murmur@ RUSB Lungs:  Decreased breath sounds bilaterally, no wheezing, rhonchi or rales Abd:  soft, nontender, no hepatomegaly Ext:  Trace-1+ bilateral LE edema Skin: warm and dry Neuro:  CNs 2-12 intact, no focal abnormalities noted  EKG:  NSR, HR 89, normal axis, nonspecific ST-T wave changes, QTc 493     ASSESSMENT AND PLAN:  1. Atrial Fibrillation:  She remains in NSR on low-dose flecainide. She had one episode of palpitations recently. If she continues to have recurrent palpitations, consider increasing dose of flecainide. Given  decreased mobility and significant morbid obesity, she is not a candidate for exercise treadmill testing to rule out pro-arrhythmia. If she has recurrent palpitations, consider follow up event monitor.  Continue Pradaxa. 2. Chronic Diastolic CHF: Volume stable. Continue current therapy. Check follow up basic metabolic panel. 3. COPD: Follow up with pulmonology as planned. 4. Hypertension: Controlled. 5. Hyperlipidemia: Continue statin. 6. Chronic Kidney Disease: Check follow up basic metabolic panel today. 7. Disposition: Follow up with Dr. Aundra Dubin in 4-6 weeks.  Signed, Richardson Dopp, PA-C  02/12/2014 9:53 AM

## 2014-02-12 NOTE — Patient Instructions (Signed)
LAB WORK TODAY BMET  NO CHANGES WERE MADE TODAY WITH YOUR MEDICATIONS  YOU HAVE A FOLLOW UP WITH DR. Aundra Dubin ON 03/23/14 @ 3 PM

## 2014-02-13 NOTE — Progress Notes (Signed)
Labs: 02/12/2014:  K 3.7, Creatinine 1.06, Glucose 333 Richardson Dopp, PA-C   02/13/2014 2:03 PM

## 2014-02-19 ENCOUNTER — Telehealth: Payer: Self-pay | Admitting: *Deleted

## 2014-02-19 DIAGNOSIS — E1129 Type 2 diabetes mellitus with other diabetic kidney complication: Secondary | ICD-10-CM | POA: Diagnosis not present

## 2014-02-19 DIAGNOSIS — I10 Essential (primary) hypertension: Secondary | ICD-10-CM

## 2014-02-19 DIAGNOSIS — N183 Chronic kidney disease, stage 3 unspecified: Secondary | ICD-10-CM | POA: Diagnosis not present

## 2014-02-19 DIAGNOSIS — J9600 Acute respiratory failure, unspecified whether with hypoxia or hypercapnia: Secondary | ICD-10-CM | POA: Diagnosis not present

## 2014-02-19 DIAGNOSIS — I4891 Unspecified atrial fibrillation: Secondary | ICD-10-CM

## 2014-02-19 DIAGNOSIS — K219 Gastro-esophageal reflux disease without esophagitis: Secondary | ICD-10-CM | POA: Diagnosis not present

## 2014-02-19 DIAGNOSIS — J449 Chronic obstructive pulmonary disease, unspecified: Secondary | ICD-10-CM | POA: Diagnosis not present

## 2014-02-19 NOTE — Telephone Encounter (Signed)
lmptcb to per Dr. Aundra Dubin and Brynda Rim. PA to schedule a [facility]. Order placed today. I will try later to reach pt again. Pt was put on flecainide, but no know CAD, r/o ischemic heart disease.

## 2014-02-24 ENCOUNTER — Inpatient Hospital Stay (HOSPITAL_COMMUNITY)
Admission: EM | Admit: 2014-02-24 | Discharge: 2014-03-12 | DRG: 208 | Disposition: A | Discharge status: Skilled Nursing Facility | Payer: Medicare Other | Attending: Cardiology | Admitting: Cardiology

## 2014-02-24 ENCOUNTER — Emergency Department (HOSPITAL_COMMUNITY): Payer: Medicare Other

## 2014-02-24 DIAGNOSIS — E662 Morbid (severe) obesity with alveolar hypoventilation: Secondary | ICD-10-CM | POA: Diagnosis present

## 2014-02-24 DIAGNOSIS — Z79899 Other long term (current) drug therapy: Secondary | ICD-10-CM

## 2014-02-24 DIAGNOSIS — E878 Other disorders of electrolyte and fluid balance, not elsewhere classified: Secondary | ICD-10-CM | POA: Diagnosis present

## 2014-02-24 DIAGNOSIS — M549 Dorsalgia, unspecified: Secondary | ICD-10-CM | POA: Diagnosis not present

## 2014-02-24 DIAGNOSIS — F329 Major depressive disorder, single episode, unspecified: Secondary | ICD-10-CM | POA: Diagnosis present

## 2014-02-24 DIAGNOSIS — Z87891 Personal history of nicotine dependence: Secondary | ICD-10-CM

## 2014-02-24 DIAGNOSIS — J9600 Acute respiratory failure, unspecified whether with hypoxia or hypercapnia: Secondary | ICD-10-CM | POA: Diagnosis not present

## 2014-02-24 DIAGNOSIS — M545 Low back pain, unspecified: Secondary | ICD-10-CM | POA: Diagnosis present

## 2014-02-24 DIAGNOSIS — J449 Chronic obstructive pulmonary disease, unspecified: Secondary | ICD-10-CM | POA: Diagnosis not present

## 2014-02-24 DIAGNOSIS — E119 Type 2 diabetes mellitus without complications: Secondary | ICD-10-CM | POA: Diagnosis present

## 2014-02-24 DIAGNOSIS — J984 Other disorders of lung: Secondary | ICD-10-CM | POA: Diagnosis not present

## 2014-02-24 DIAGNOSIS — J9620 Acute and chronic respiratory failure, unspecified whether with hypoxia or hypercapnia: Secondary | ICD-10-CM | POA: Diagnosis not present

## 2014-02-24 DIAGNOSIS — I10 Essential (primary) hypertension: Secondary | ICD-10-CM | POA: Diagnosis present

## 2014-02-24 DIAGNOSIS — I129 Hypertensive chronic kidney disease with stage 1 through stage 4 chronic kidney disease, or unspecified chronic kidney disease: Secondary | ICD-10-CM | POA: Diagnosis present

## 2014-02-24 DIAGNOSIS — I5033 Acute on chronic diastolic (congestive) heart failure: Secondary | ICD-10-CM | POA: Diagnosis present

## 2014-02-24 DIAGNOSIS — Z6841 Body Mass Index (BMI) 40.0 and over, adult: Secondary | ICD-10-CM

## 2014-02-24 DIAGNOSIS — J438 Other emphysema: Secondary | ICD-10-CM | POA: Diagnosis not present

## 2014-02-24 DIAGNOSIS — K219 Gastro-esophageal reflux disease without esophagitis: Secondary | ICD-10-CM | POA: Diagnosis present

## 2014-02-24 DIAGNOSIS — E876 Hypokalemia: Secondary | ICD-10-CM | POA: Diagnosis present

## 2014-02-24 DIAGNOSIS — I509 Heart failure, unspecified: Secondary | ICD-10-CM | POA: Diagnosis not present

## 2014-02-24 DIAGNOSIS — M1990 Unspecified osteoarthritis, unspecified site: Secondary | ICD-10-CM | POA: Diagnosis present

## 2014-02-24 DIAGNOSIS — E785 Hyperlipidemia, unspecified: Secondary | ICD-10-CM | POA: Diagnosis present

## 2014-02-24 DIAGNOSIS — J441 Chronic obstructive pulmonary disease with (acute) exacerbation: Principal | ICD-10-CM | POA: Diagnosis present

## 2014-02-24 DIAGNOSIS — R0902 Hypoxemia: Secondary | ICD-10-CM

## 2014-02-24 DIAGNOSIS — G9340 Encephalopathy, unspecified: Secondary | ICD-10-CM | POA: Diagnosis not present

## 2014-02-24 DIAGNOSIS — J811 Chronic pulmonary edema: Secondary | ICD-10-CM

## 2014-02-24 DIAGNOSIS — J4489 Other specified chronic obstructive pulmonary disease: Secondary | ICD-10-CM | POA: Diagnosis not present

## 2014-02-24 DIAGNOSIS — G4733 Obstructive sleep apnea (adult) (pediatric): Secondary | ICD-10-CM | POA: Diagnosis present

## 2014-02-24 DIAGNOSIS — E1165 Type 2 diabetes mellitus with hyperglycemia: Secondary | ICD-10-CM | POA: Diagnosis present

## 2014-02-24 DIAGNOSIS — Z7901 Long term (current) use of anticoagulants: Secondary | ICD-10-CM | POA: Diagnosis not present

## 2014-02-24 DIAGNOSIS — N183 Chronic kidney disease, stage 3 unspecified: Secondary | ICD-10-CM | POA: Diagnosis present

## 2014-02-24 DIAGNOSIS — I4891 Unspecified atrial fibrillation: Secondary | ICD-10-CM | POA: Diagnosis present

## 2014-02-24 DIAGNOSIS — D638 Anemia in other chronic diseases classified elsewhere: Secondary | ICD-10-CM | POA: Diagnosis present

## 2014-02-24 DIAGNOSIS — G8929 Other chronic pain: Secondary | ICD-10-CM | POA: Diagnosis present

## 2014-02-24 DIAGNOSIS — R079 Chest pain, unspecified: Secondary | ICD-10-CM

## 2014-02-24 DIAGNOSIS — M25559 Pain in unspecified hip: Secondary | ICD-10-CM | POA: Diagnosis not present

## 2014-02-24 DIAGNOSIS — I6529 Occlusion and stenosis of unspecified carotid artery: Secondary | ICD-10-CM | POA: Diagnosis present

## 2014-02-24 DIAGNOSIS — I5032 Chronic diastolic (congestive) heart failure: Secondary | ICD-10-CM | POA: Diagnosis present

## 2014-02-24 DIAGNOSIS — E871 Hypo-osmolality and hyponatremia: Secondary | ICD-10-CM | POA: Diagnosis present

## 2014-02-24 DIAGNOSIS — D72829 Elevated white blood cell count, unspecified: Secondary | ICD-10-CM | POA: Diagnosis present

## 2014-02-24 DIAGNOSIS — M79609 Pain in unspecified limb: Secondary | ICD-10-CM | POA: Diagnosis not present

## 2014-02-24 DIAGNOSIS — R4182 Altered mental status, unspecified: Secondary | ICD-10-CM | POA: Diagnosis not present

## 2014-02-24 DIAGNOSIS — I480 Paroxysmal atrial fibrillation: Secondary | ICD-10-CM | POA: Diagnosis present

## 2014-02-24 DIAGNOSIS — R0602 Shortness of breath: Secondary | ICD-10-CM | POA: Diagnosis not present

## 2014-02-24 DIAGNOSIS — Z5189 Encounter for other specified aftercare: Secondary | ICD-10-CM | POA: Diagnosis not present

## 2014-02-24 DIAGNOSIS — D649 Anemia, unspecified: Secondary | ICD-10-CM | POA: Diagnosis not present

## 2014-02-24 DIAGNOSIS — J9690 Respiratory failure, unspecified, unspecified whether with hypoxia or hypercapnia: Secondary | ICD-10-CM

## 2014-02-24 DIAGNOSIS — J9610 Chronic respiratory failure, unspecified whether with hypoxia or hypercapnia: Secondary | ICD-10-CM | POA: Diagnosis not present

## 2014-02-24 DIAGNOSIS — I2789 Other specified pulmonary heart diseases: Secondary | ICD-10-CM | POA: Diagnosis present

## 2014-02-24 DIAGNOSIS — M6281 Muscle weakness (generalized): Secondary | ICD-10-CM | POA: Diagnosis not present

## 2014-02-24 DIAGNOSIS — F3289 Other specified depressive episodes: Secondary | ICD-10-CM | POA: Diagnosis present

## 2014-02-24 DIAGNOSIS — J439 Emphysema, unspecified: Secondary | ICD-10-CM | POA: Diagnosis present

## 2014-02-24 DIAGNOSIS — Z66 Do not resuscitate: Secondary | ICD-10-CM | POA: Diagnosis present

## 2014-02-24 DIAGNOSIS — R498 Other voice and resonance disorders: Secondary | ICD-10-CM

## 2014-02-24 DIAGNOSIS — I272 Pulmonary hypertension, unspecified: Secondary | ICD-10-CM | POA: Diagnosis present

## 2014-02-24 DIAGNOSIS — R0989 Other specified symptoms and signs involving the circulatory and respiratory systems: Secondary | ICD-10-CM | POA: Diagnosis present

## 2014-02-24 DIAGNOSIS — R9431 Abnormal electrocardiogram [ECG] [EKG]: Secondary | ICD-10-CM | POA: Diagnosis present

## 2014-02-24 LAB — CBC WITH DIFFERENTIAL/PLATELET
BASOS PCT: 0 % (ref 0–1)
Basophils Absolute: 0 10*3/uL (ref 0.0–0.1)
EOS ABS: 0.1 10*3/uL (ref 0.0–0.7)
Eosinophils Relative: 1 % (ref 0–5)
HCT: 28.3 % — ABNORMAL LOW (ref 36.0–46.0)
HEMOGLOBIN: 8.8 g/dL — AB (ref 12.0–15.0)
Lymphocytes Relative: 12 % (ref 12–46)
Lymphs Abs: 2.9 10*3/uL (ref 0.7–4.0)
MCH: 28.1 pg (ref 26.0–34.0)
MCHC: 31.1 g/dL (ref 30.0–36.0)
MCV: 90.4 fL (ref 78.0–100.0)
MONO ABS: 1.3 10*3/uL — AB (ref 0.1–1.0)
Monocytes Relative: 5 % (ref 3–12)
NEUTROS ABS: 19.8 10*3/uL — AB (ref 1.7–7.7)
Neutrophils Relative %: 82 % — ABNORMAL HIGH (ref 43–77)
Platelets: 410 10*3/uL — ABNORMAL HIGH (ref 150–400)
RBC: 3.13 MIL/uL — ABNORMAL LOW (ref 3.87–5.11)
RDW: 17.4 % — ABNORMAL HIGH (ref 11.5–15.5)
WBC: 24.2 10*3/uL — ABNORMAL HIGH (ref 4.0–10.5)

## 2014-02-24 LAB — I-STAT ARTERIAL BLOOD GAS, ED
Acid-Base Excess: 3 mmol/L — ABNORMAL HIGH (ref 0.0–2.0)
Bicarbonate: 32.5 mEq/L — ABNORMAL HIGH (ref 20.0–24.0)
O2 Saturation: 93 %
PCO2 ART: 78.6 mmHg — AB (ref 35.0–45.0)
PH ART: 7.22 — AB (ref 7.350–7.450)
Patient temperature: 36.2
TCO2: 35 mmol/L (ref 0–100)
pO2, Arterial: 79 mmHg — ABNORMAL LOW (ref 80.0–100.0)

## 2014-02-24 LAB — URINALYSIS, ROUTINE W REFLEX MICROSCOPIC
BILIRUBIN URINE: NEGATIVE
Hgb urine dipstick: NEGATIVE
KETONES UR: NEGATIVE mg/dL
Leukocytes, UA: NEGATIVE
Nitrite: NEGATIVE
PH: 5 (ref 5.0–8.0)
Protein, ur: NEGATIVE mg/dL
Specific Gravity, Urine: 1.023 (ref 1.005–1.030)
Urobilinogen, UA: 1 mg/dL (ref 0.0–1.0)

## 2014-02-24 LAB — COMPREHENSIVE METABOLIC PANEL
ALBUMIN: 3.3 g/dL — AB (ref 3.5–5.2)
ALT: 16 U/L (ref 0–35)
AST: 20 U/L (ref 0–37)
Alkaline Phosphatase: 96 U/L (ref 39–117)
BUN: 20 mg/dL (ref 6–23)
CHLORIDE: 90 meq/L — AB (ref 96–112)
CO2: 29 mEq/L (ref 19–32)
Calcium: 9 mg/dL (ref 8.4–10.5)
Creatinine, Ser: 1.06 mg/dL (ref 0.50–1.10)
GFR calc Af Amer: 61 mL/min — ABNORMAL LOW (ref >90)
GFR calc non Af Amer: 53 mL/min — ABNORMAL LOW (ref >90)
Glucose, Bld: 466 mg/dL — ABNORMAL HIGH (ref 70–99)
Potassium: 4.9 mEq/L (ref 3.7–5.3)
Sodium: 134 mEq/L — ABNORMAL LOW (ref 137–147)
Total Bilirubin: 0.7 mg/dL (ref 0.3–1.2)
Total Protein: 7.3 g/dL (ref 6.0–8.3)

## 2014-02-24 LAB — URINE MICROSCOPIC-ADD ON

## 2014-02-24 LAB — PROTIME-INR
INR: 1.27 (ref 0.00–1.49)
Prothrombin Time: 15.6 seconds — ABNORMAL HIGH (ref 11.6–15.2)

## 2014-02-24 LAB — I-STAT CG4 LACTIC ACID, ED: LACTIC ACID, VENOUS: 0.9 mmol/L (ref 0.5–2.2)

## 2014-02-24 LAB — TROPONIN I: Troponin I: <0.3 ng/mL (ref <0.30)

## 2014-02-24 LAB — CBG MONITORING, ED: Glucose-Capillary: 466 mg/dL — ABNORMAL HIGH (ref 70–99)

## 2014-02-24 MED ORDER — FLECAINIDE ACETATE 50 MG PO TABS: 50.0000 mg | ORAL_TABLET | Freq: Two times a day (BID) | ORAL | Status: DC

## 2014-02-24 MED ORDER — POTASSIUM CHLORIDE 20 MEQ/15ML (10%) PO LIQD
20.0000 meq | Freq: Once | ORAL | Status: AC
Start: 2014-02-25 — End: 2014-02-25
  Administered 2014-02-25: 20 meq
  Filled 2014-02-24: qty 15

## 2014-02-24 MED ORDER — ETOMIDATE 2 MG/ML IV SOLN
20.0000 mg | Freq: Once | INTRAVENOUS | Status: AC
  Administered 2014-02-24 (×2): 20 mg via INTRAVENOUS

## 2014-02-24 MED ORDER — SUCCINYLCHOLINE CHLORIDE 20 MG/ML IJ SOLN
INTRAMUSCULAR | Status: AC
  Administered 2014-02-24: 22:00:00
  Filled 2014-02-24: qty 1

## 2014-02-24 MED ORDER — ALBUTEROL SULFATE (2.5 MG/3ML) 0.083% IN NEBU
2.5000 mg | INHALATION_SOLUTION | RESPIRATORY_TRACT | Status: DC | PRN
  Administered 2014-02-26: 2.5 mg via RESPIRATORY_TRACT
  Filled 2014-02-24: qty 3

## 2014-02-24 MED ORDER — LIDOCAINE HCL (CARDIAC) 20 MG/ML IV SOLN
INTRAVENOUS | Status: AC
  Administered 2014-02-24: 22:00:00
  Filled 2014-02-24: qty 5

## 2014-02-24 MED ORDER — DILTIAZEM HCL 60 MG PO TABS
60.0000 mg | ORAL_TABLET | Freq: Four times a day (QID) | ORAL | Status: DC
  Administered 2014-02-25 – 2014-02-27 (×7): 60 mg via ORAL
  Filled 2014-02-24 (×15): qty 1

## 2014-02-24 MED ORDER — PROPOFOL 10 MG/ML IV EMUL
0.0000 ug/kg/min | INTRAVENOUS | Status: DC
  Administered 2014-02-25: 20 ug/kg/min via INTRAVENOUS
  Administered 2014-02-25: 35 ug/kg/min via INTRAVENOUS
  Administered 2014-02-25: 50 ug/kg/min via INTRAVENOUS
  Filled 2014-02-24: qty 200
  Filled 2014-02-24: qty 100

## 2014-02-24 MED ORDER — INSULIN ASPART 100 UNIT/ML ~~LOC~~ SOLN: 0.0000 [IU] | SUBCUTANEOUS | Status: DC

## 2014-02-24 MED ORDER — ALBUTEROL (5 MG/ML) CONTINUOUS INHALATION SOLN: 2.5000 mg/h | INHALATION_SOLUTION | Freq: Four times a day (QID) | RESPIRATORY_TRACT | Status: DC

## 2014-02-24 MED ORDER — ROCURONIUM BROMIDE 50 MG/5ML IV SOLN
INTRAVENOUS | Status: AC
  Administered 2014-02-24: 100 mg via INTRAVENOUS
  Filled 2014-02-24: qty 2

## 2014-02-24 MED ORDER — SODIUM CHLORIDE 0.9 % IV SOLN
INTRAVENOUS | Status: DC
  Administered 2014-02-24: via INTRAVENOUS

## 2014-02-24 MED ORDER — FUROSEMIDE 10 MG/ML IJ SOLN
40.0000 mg | Freq: Once | INTRAMUSCULAR | Status: AC
  Administered 2014-02-25: 40 mg via INTRAVENOUS
  Filled 2014-02-24: qty 4

## 2014-02-24 MED ORDER — VANCOMYCIN HCL 10 G IV SOLR
2000.0000 mg | Freq: Once | INTRAVENOUS | Status: AC
  Administered 2014-02-25: 2000 mg via INTRAVENOUS
  Filled 2014-02-24: qty 2000

## 2014-02-24 MED ORDER — PIPERACILLIN-TAZOBACTAM 3.375 G IVPB
3.3750 g | Freq: Once | INTRAVENOUS | Status: AC
  Administered 2014-02-24: 3.375 g via INTRAVENOUS
  Filled 2014-02-24: qty 50

## 2014-02-24 MED ORDER — FENTANYL CITRATE 0.05 MG/ML IJ SOLN
50.0000 ug | INTRAMUSCULAR | Status: DC | PRN
  Administered 2014-02-25: 50 ug via INTRAVENOUS
  Filled 2014-02-24: qty 2

## 2014-02-24 MED ORDER — ETOMIDATE 2 MG/ML IV SOLN
INTRAVENOUS | Status: AC
  Administered 2014-02-24: 20 mg via INTRAVENOUS
  Filled 2014-02-24: qty 20

## 2014-02-24 MED ORDER — FAMOTIDINE 40 MG/5ML PO SUSR
20.0000 mg | Freq: Two times a day (BID) | ORAL | Status: DC
  Administered 2014-02-25: 20 mg
  Filled 2014-02-24 (×5): qty 2.5

## 2014-02-24 MED ORDER — IPRATROPIUM BROMIDE 0.02 % IN SOLN
1.5000 mg | Freq: Once | RESPIRATORY_TRACT | Status: AC
  Administered 2014-02-24: 1.5 mg via RESPIRATORY_TRACT
  Filled 2014-02-24: qty 7.5

## 2014-02-24 MED ORDER — SODIUM CHLORIDE 0.9 % IV SOLN
250.0000 mL | INTRAVENOUS | Status: DC | PRN
  Administered 2014-02-27 – 2014-02-28 (×2): 250 mL via INTRAVENOUS

## 2014-02-24 MED ORDER — HEPARIN SODIUM (PORCINE) 5000 UNIT/ML IJ SOLN
5000.0000 [IU] | Freq: Three times a day (TID) | INTRAMUSCULAR | Status: DC
  Administered 2014-02-25: 5000 [IU] via SUBCUTANEOUS
  Filled 2014-02-24: qty 1

## 2014-02-24 MED ORDER — ROCURONIUM BROMIDE 50 MG/5ML IV SOLN
100.0000 mg | Freq: Once | INTRAVENOUS | Status: AC
  Administered 2014-02-24 (×2): 100 mg via INTRAVENOUS
  Filled 2014-02-24: qty 10

## 2014-02-24 MED ORDER — FUROSEMIDE 10 MG/ML IJ SOLN
40.0000 mg | Freq: Once | INTRAMUSCULAR | Status: AC
  Administered 2014-02-24: 40 mg via INTRAVENOUS
  Filled 2014-02-24: qty 4

## 2014-02-24 MED ORDER — IPRATROPIUM BROMIDE 0.02 % IN SOLN
0.5000 mg | Freq: Four times a day (QID) | RESPIRATORY_TRACT | Status: DC
  Administered 2014-02-25 – 2014-02-26 (×5): 0.5 mg via RESPIRATORY_TRACT
  Filled 2014-02-24 (×5): qty 2.5

## 2014-02-24 MED ORDER — PROPOFOL 10 MG/ML IV EMUL
INTRAVENOUS | Status: AC
  Administered 2014-02-24: 1000 mg
  Filled 2014-02-24: qty 100

## 2014-02-24 MED ORDER — ASPIRIN 81 MG PO CHEW: 324.0000 mg | CHEWABLE_TABLET | ORAL | Status: AC

## 2014-02-24 MED ORDER — ASPIRIN 300 MG RE SUPP
300.0000 mg | RECTAL | Status: AC
  Administered 2014-02-24: 300 mg via RECTAL
  Filled 2014-02-24: qty 1

## 2014-02-24 MED ORDER — ALBUTEROL (5 MG/ML) CONTINUOUS INHALATION SOLN
10.0000 mg/h | INHALATION_SOLUTION | Freq: Once | RESPIRATORY_TRACT | Status: AC
  Administered 2014-02-24: 10 mg/h via RESPIRATORY_TRACT
  Filled 2014-02-24: qty 20

## 2014-02-24 NOTE — ED Notes (Addendum)
CG-4 Result shown to Dr. Cherlynn June

## 2014-02-24 NOTE — Telephone Encounter (Signed)
lmptcb x 2 to schedule a lexiscan per Dr. Aundra Dubin. s/w pt's relative [name] and she states pt is in [name] gave me # for pt [phone]. I then lmptcb to schedule lexiscan per Dr. Aundra Dubin.

## 2014-02-24 NOTE — ED Notes (Signed)
EMS states pt coming from SNF with complaint of SOB all day.  Pt given 5 albuterol treatments by nursing staff and one duoneb treatment by EMS along with 125 solumedrol and 2g of mag.  Pt satting upper 80's on CPAP prior to arrival and became obtunded prior to arrival and EMS started bagging.  Per EMS pt stated that she wanted to be intubated prior to being placed on CPAP.

## 2014-02-24 NOTE — Progress Notes (Addendum)
Changes made post ABG results per MD Nelda Marseille

## 2014-02-24 NOTE — ED Provider Notes (Signed)
CSN: [id number]     Arrival date & time 02/24/14  2211 History   First MD Initiated Contact with Patient 02/24/14 2222     Chief Complaint  Patient presents with  . Respiratory Arrest    HPI 68 year old female with a history of severe, end-stage COPD as well as diastolic congestive heart failure presents with respiratory failure. The patient is unresponsive. All history is obtained from EMS and brief review of records. The patient was at a nursing home. Per EMS, she's been complaining of shortness of breath all day. Nursing home staff gave her 5 albuterol treatments. She felt improved and EMS was called. When EMS arrived they gave her one DuoNeb, 125 mg Solu-Medrol IV, 2 g of mag. They started her on BiPAP. They felt initially that she was improving but just before arrival to the emergency department she became obtunded. He took off BiPAP and began bagging her for agonal respirations.  On arrival in the emergency department she is unresponsive, GCS 4, being bagged by EMS.  EMS reports that she does have a DO NOT RESUSCITATE order. However when they arrived at the nursing facility, the patient was awake and alert and they asked if she would want to be intubated if necessary, and the patient more than once indicated that she would want to be intubated if needed.   Past Medical History  Diagnosis Date  . Chronic low back pain   . Emphysema   . Paroxysmal atrial fibrillation   . COPD (chronic obstructive pulmonary disease)   . Hyperlipidemia   . Diastolic congestive heart failure   . Obese   . Carotid atherosclerosis   . Lung nodules   . Hoarseness, chronic   . Pneumonia 1990's    "once"  . Type 2 diabetes mellitus   . GERD (gastroesophageal reflux disease)   . Arthritis     "joints" (01/14/2014)  . Depression     "maybe" (01/14/2014   Past Surgical History  Procedure Laterality Date  . Esophagogastroduodenoscopy  01/26/2012    Procedure: ESOPHAGOGASTRODUODENOSCOPY (EGD);  Surgeon:  Beryle Beams, MD;  Location: [facility];  Service: Endoscopy;  Laterality: N/A;  . Colonoscopy  01/26/2012    Procedure: COLONOSCOPY;  Surgeon: Beryle Beams, MD;  Location: [facility];  Service: Endoscopy;  Laterality: N/A;  . Breast biopsy Left ~ 1980    "solid"  . Breast lumpectomy Left ~ 1980    "removed a mass; it was benign" (01/14/2014)  . Total abdominal hysterectomy  1994   Family History  Problem Relation Age of Onset  . Breast cancer Sister   . Heart disease    . Hodgkin's lymphoma    . Asthma Mother    History  Substance Use Topics  . Smoking status: Former Smoker -- 3.00 packs/day for 45 years    Types: Cigarettes    Quit date: 12/28/2008  . Smokeless tobacco: Never Used  . Alcohol Use: Yes     Comment: 01/14/2014 "used to drink socially in the past; last drink was probably 10 yr ago"   OB History   Grav Para Term Preterm Abortions TAB SAB Ect Mult Living                 Review of Systems  Unable to perform ROS: Patient unresponsive      Allergies  Ace inhibitors and Clindamycin  Home Medications   Current Outpatient Rx  Name  Route  Sig  Dispense  Refill  . acetaminophen (TYLENOL)  325 MG tablet   Oral   Take 325 mg by mouth daily as needed for mild pain.          Marland Kitchen albuterol (PROAIR HFA) 108 (90 BASE) MCG/ACT inhaler   Inhalation   Inhale 2 puffs into the lungs every 6 (six) hours as needed for shortness of breath.   1 Inhaler   3   . Amino Acids-Protein Hydrolys (FEEDING SUPPLEMENT, PRO-STAT SUGAR FREE 64,) LIQD   Oral   Take 30 mLs by mouth 2 (two) times daily.         . Ascorbic Acid (VITAMIN C) 500 MG CAPS   Oral   Take 1 tablet by mouth daily.         . bisacodyl (DULCOLAX) 10 MG suppository   Rectal   Place 1 suppository (10 mg total) rectally daily as needed for moderate constipation.   12 suppository   0   . budesonide-formoterol (SYMBICORT) 80-4.5 MCG/ACT inhaler   Inhalation   Inhale 2 puffs into the lungs 2 (two)  times daily.   1 Inhaler   5   . Ca & Phos-Vit D-Mag (CALCIUM) [phone] TABS   Oral   Take 1 tablet by mouth daily.          . Cholecalciferol (VITAMIN D) 2000 UNITS tablet   Oral   Take 2,000 Units by mouth daily.         . dabigatran (PRADAXA) 150 MG CAPS capsule   Oral   Take 1 capsule (150 mg total) by mouth 2 (two) times daily.   60 capsule      . diltiazem (CARDIZEM CD) 300 MG 24 hr capsule   Oral   Take 1 capsule (300 mg total) by mouth daily.   30 capsule   0   . ezetimibe-simvastatin (VYTORIN) 10-40 MG per tablet   Oral   Take 1 tablet by mouth at bedtime.          . flecainide (TAMBOCOR) 50 MG tablet   Oral   Take 1 tablet (50 mg total) by mouth every 12 (twelve) hours.   60 tablet   0   . furosemide (LASIX) 40 MG tablet   Oral   Take 1 tablet (40 mg total) by mouth 2 (two) times daily.   60 tablet   0   . glyBURIDE micronized (GLYNASE) 3 MG tablet   Oral   Take 3 mg by mouth daily.           Marland Kitchen guaiFENesin-codeine 100-10 MG/5ML syrup   Oral   Take 5 mLs by mouth every 6 (six) hours as needed for cough.   120 mL   0   . ipratropium-albuterol (DUONEB) 0.5-2.5 (3) MG/3ML SOLN   Nebulization   Take 3 mLs by nebulization 2 (two) times daily.   360 mL      . lansoprazole (PREVACID) 30 MG capsule   Oral   Take 30 mg by mouth daily at 12 noon.         . Magnesium-Zinc 133.33-5 MG TABS   Oral   Take 133 mg by mouth daily.         . Multiple Vitamins-Iron (MULTIVITAMIN/IRON PO)   Oral   Take 1 tablet by mouth daily.         . potassium chloride SA (K-DUR,KLOR-CON) 20 MEQ tablet   Oral   Take 20 mEq by mouth daily.          Marland Kitchen tiotropium (SPIRIVA) 18  MCG inhalation capsule   Inhalation   Place 18 mcg into inhaler and inhale daily.         . vitamin B-12 (CYANOCOBALAMIN) 500 MCG tablet   Oral   Take 500 mcg by mouth daily.         . vitamin E 400 UNIT capsule   Oral   Take 400 Units by mouth daily.           BP 162/52  Pulse 103  Resp 31  Ht 5\' 5"  (1.651 m)  SpO2 93% Physical Exam  Vitals reviewed. Constitutional:  Morbidly obese, unresponsive, being bagged by EMS  HENT:  Head: Normocephalic and atraumatic.  Mouth/Throat: Oropharynx is clear and moist.  Eyes: Conjunctivae are normal. Pupils are equal, round, and reactive to light.  Neck: Normal range of motion. Neck supple. No tracheal deviation present.  Cardiovascular: Regular rhythm, normal heart sounds and intact distal pulses.  Exam reveals no gallop and no friction rub.   No murmur heard. Tachycardia  Pulmonary/Chest: No stridor.  Agonal respirations, diminished breath sounds diffusely, end expiratory wheezes.  Abdominal: Soft. She exhibits no distension.  Musculoskeletal: She exhibits edema (1+ bilateral lower extremity edema).  Neurological: She is unresponsive. GCS eye subscore is 1. GCS verbal subscore is 1. GCS motor subscore is 1.  Pupils 4 mm equal round and reactive to light. No spontaneous movement of bilateral upper or lower extremities. No movement to pain  Skin: Skin is warm and dry.    ED Course  Procedures (including critical care time) Labs Review Labs Reviewed  CBC WITH DIFFERENTIAL - Abnormal; Notable for the following:    WBC 24.2 (*)    RBC 3.13 (*)    Hemoglobin 8.8 (*)    HCT 28.3 (*)    RDW 17.4 (*)    Platelets 410 (*)    Neutrophils Relative % 82 (*)    Neutro Abs 19.8 (*)    Monocytes Absolute 1.3 (*)    All other components within normal limits  CBG MONITORING, ED - Abnormal; Notable for the following:    Glucose-Capillary 466 (*)    All other components within normal limits  CULTURE, BLOOD (ROUTINE X 2)  CULTURE, BLOOD (ROUTINE X 2)  URINE CULTURE  URINALYSIS, ROUTINE W REFLEX MICROSCOPIC  COMPREHENSIVE METABOLIC PANEL  BLOOD GAS, ARTERIAL  TROPONIN I  PROTIME-INR  PRO B NATRIURETIC PEPTIDE  CBC  CREATININE, SERUM  URINALYSIS, ROUTINE W REFLEX MICROSCOPIC  STREP PNEUMONIAE  URINARY ANTIGEN  CBC  BASIC METABOLIC PANEL  BLOOD GAS, ARTERIAL  I-STAT CG4 LACTIC ACID, ED   Imaging Review Dg Chest Portable 1 View  02/24/2014   CLINICAL DATA:  Respiratory distress.  EXAM: PORTABLE CHEST - 1 VIEW  COMPARISON:  01/26/2014  FINDINGS: Endotracheal tube ends in the lower thoracic trachea, but above the carina (which is difficult visualize) likely by 2 or 3 cm. Enteric tube crosses the diaphragm.  Chronic cardiopericardial enlargement. Diffuse interstitial coarsening with Kerley B-lines. Trace bilateral pleural effusion. Perihilar airspace opacity. No evidence of pneumothorax.  IMPRESSION: 1. Endotracheal and orogastric tubes are in good position. 2. CHF.   Electronically Signed   By: Jorje Guild M.D.   On: 02/24/2014 22:42     EKG Interpretation   Date/Time:  Tuesday February 24 2014 22:16:08 EDT Ventricular Rate:  111 PR Interval:  162 QRS Duration: 111 QT Interval:  364 QTC Calculation: 495 R Axis:   52 Text Interpretation:  Sinus tachycardia Abnormal R-wave progression, early  transition Borderline prolonged QT interval Nonspecific T wave abnormality  Confirmed by GOLDSTON  MD, SCOTT ([id number]) on 02/24/2014 10:54:01 PM      MDM   68 year old female with history of severe, end-stage COPD, and diastolic congestive heart failure presents with respiratory failure. She told EMS when they arrived that she would want to be intubated if needed, despite a prior standing DO NOT RESUSCITATE. She has received several albuterol nebs throughout the day, was given duo nebs by EMS, just received 125 mg Solu-Medrol IV as well as 2 g of magnesium. She was on BiPAP during transport. [place] before arrival to the emergency department she became obtunded. EMS started bagging for agonal respirations.  On arrival, GCS 3, agonal respirations, not protecting her airway, requires prolonged bagging to oxygenate, difficult to bag.  I performed intubation with Glidescope.    At this point  critical care was in the emergency department and were informed to the patient in consultation. They agree to admit the patient and assume care at this time. Her EKG was reviewed and shows sinus tachycardia with nonspecific T wave abnormality, no ST elevations or depressions. Her chest x-ray shows good positioning of the ET tube and changes suggestive of congestive heart failure. Continuous albuterol and Atrovent nebs ordered. Please see critical care's note for further management.  Final diagnoses:  Respiratory failure      Wendall Papa, MD 02/24/14 2308

## 2014-02-24 NOTE — H&P (Signed)
PULMONARY / CRITICAL CARE MEDICINE   Name: Paige Hardin MRN: [id number] DOB: 12-28-1945    ADMISSION DATE:  02/24/2014  REFERRING MD :  Dr. Regenia Skeeter  PRIMARY SERVICE: PCCM   CHIEF COMPLAINT:  Respiratory Failure   BRIEF PATIENT DESCRIPTION: 68 y/o F, SNF resident, who presented 3/31 with worsening respiratory distress.  Patient required intubation in ER.  PCCM consulted for ICU admit.   SIGNIFICANT EVENTS / STUDIES:  3/31 - worsening SOB at SNF despite multiple albuterol Rx, intubated in ER.  CXR c/w edema on admit  LINES / TUBES: OETT 3/31>>>  CULTURES: UA 3/31>>> UC 3/31>>> U. Strep Antigen 3/31>>> BCx2 3/31>>>   ANTIBIOTICS: Vanco 3/31 (empiric)>>> Zosyn 3/31 (empiric)>>>  HISTORY OF PRESENT ILLNESS:  68 y/o F, SNF resident with PMH of PAF, DM, GERD, Depression, HLD, DCHF, morbid obesity, chronic back pain & COPD who presented to [facility] ER via EMS on 3/31 with 1 day hx of worsening shortness of breath.  Patient was treated with multiple albuterol treatments throughout the day without relief of symptoms.  EMS was activated and treated patient with 125 mg of solumedrol, 2gm magnesium and placed the patient on CPAP.  Saturations were in upper 80's on CPAP.  She initially improved with non-invasive support.  On arrival to [facility], she became obtunded and required bagging per EMS.  Prior to change in mental status, EMS reported she stated she wanted to be intubated "if it was necessary".  Patient was intubated in ER.    ER work up noted glucose of 466, Na 134, sr cr 1.06, lactate of 0.90, wbc 24.2, hgb 8.8 and platelets of 410.  PCCM called for ICU admission.    PAST MEDICAL HISTORY :  Past Medical History  Diagnosis Date  . Chronic low back pain   . Emphysema   . Paroxysmal atrial fibrillation   . COPD (chronic obstructive pulmonary disease)   . Hyperlipidemia   . Diastolic congestive heart failure   . Obese   . Carotid atherosclerosis   . Lung nodules   . Hoarseness, chronic   .  Pneumonia 1990's    "once"  . Type 2 diabetes mellitus   . GERD (gastroesophageal reflux disease)   . Arthritis     "joints" (01/14/2014)  . Depression     "maybe" (01/14/2014   Past Surgical History  Procedure Laterality Date  . Esophagogastroduodenoscopy  01/26/2012    Procedure: ESOPHAGOGASTRODUODENOSCOPY (EGD);  Surgeon: Beryle Beams, MD;  Location: [facility];  Service: Endoscopy;  Laterality: N/A;  . Colonoscopy  01/26/2012    Procedure: COLONOSCOPY;  Surgeon: Beryle Beams, MD;  Location: [facility];  Service: Endoscopy;  Laterality: N/A;  . Breast biopsy Left ~ 1980    "solid"  . Breast lumpectomy Left ~ 1980    "removed a mass; it was benign" (01/14/2014)  . Total abdominal hysterectomy  1994   Prior to Admission medications   Medication Sig Start Date End Date Taking? Authorizing Provider  acetaminophen (TYLENOL) 325 MG tablet Take 325 mg by mouth daily as needed for mild pain.     Historical Provider, MD  albuterol (PROAIR HFA) 108 (90 BASE) MCG/ACT inhaler Inhale 2 puffs into the lungs every 6 (six) hours as needed for shortness of breath. 11/07/11   Kathee Delton, MD  Ascorbic Acid (VITAMIN C) 500 MG CAPS Take 1 tablet by mouth daily.    Historical Provider, MD  bisacodyl (DULCOLAX) 10 MG suppository Place 1 suppository (10 mg total)  rectally daily as needed for moderate constipation. 01/23/14   Ripudeep Krystal Eaton, MD  budesonide-formoterol (SYMBICORT) 80-4.5 MCG/ACT inhaler Inhale 2 puffs into the lungs 2 (two) times daily. 11/07/11   Kathee Delton, MD  Ca & Phos-Vit D-Mag (CALCIUM) [phone] TABS Take by mouth.    Historical Provider, MD  Cholecalciferol (VITAMIN D) 2000 UNITS tablet Take 2,000 Units by mouth daily.    Historical Provider, MD  dabigatran (PRADAXA) 150 MG CAPS capsule Take 1 capsule (150 mg total) by mouth 2 (two) times daily. 01/23/14   Ripudeep Krystal Eaton, MD  diltiazem (CARDIZEM CD) 300 MG 24 hr capsule Take 1 capsule (300 mg total) by mouth daily. 01/29/14    Nita Sells, MD  ezetimibe-simvastatin (VYTORIN) 10-40 MG per tablet Take 1 tablet by mouth at bedtime.     Historical Provider, MD  flecainide (TAMBOCOR) 50 MG tablet Take 1 tablet (50 mg total) by mouth every 12 (twelve) hours. 01/29/14   Nita Sells, MD  furosemide (LASIX) 40 MG tablet Take 1 tablet (40 mg total) by mouth 2 (two) times daily. 01/29/14   Nita Sells, MD  glyBURIDE micronized (GLYNASE) 3 MG tablet Take 3 mg by mouth daily.      Historical Provider, MD  guaiFENesin-codeine 100-10 MG/5ML syrup Take 5 mLs by mouth every 6 (six) hours as needed for cough. 01/23/14   Ripudeep Krystal Eaton, MD  ipratropium-albuterol (DUONEB) 0.5-2.5 (3) MG/3ML SOLN Take 3 mLs by nebulization 2 (two) times daily. 01/23/14   Ripudeep Krystal Eaton, MD  Magnesium-Zinc 133.33-5 MG TABS Take 133 mg by mouth daily.    Historical Provider, MD  Multiple Vitamins-Iron (MULTIVITAMIN/IRON PO) Take 1 tablet by mouth daily.    Historical Provider, MD  potassium chloride SA (K-DUR,KLOR-CON) 20 MEQ tablet Take 20 mEq by mouth daily.     Historical Provider, MD  tiotropium (SPIRIVA) 18 MCG inhalation capsule Place 18 mcg into inhaler and inhale daily.    Historical Provider, MD  vitamin B-12 (CYANOCOBALAMIN) 500 MCG tablet Take 500 mcg by mouth daily.    Historical Provider, MD  vitamin E 400 UNIT capsule Take 400 Units by mouth daily.    Historical Provider, MD   Allergies  Allergen Reactions  . Ace Inhibitors Cough  . Clindamycin Rash    FAMILY HISTORY:  Family History  Problem Relation Age of Onset  . Breast cancer Sister   . Heart disease    . Hodgkin's lymphoma    . Asthma Mother    SOCIAL HISTORY:  reports that she quit smoking about 5 years ago. Her smoking use included Cigarettes. She has a 135 pack-year smoking history. She has never used smokeless tobacco. She reports that she drinks alcohol. She reports that she does not use illicit drugs.  REVIEW OF SYSTEMS:  Unable to complete as patient  is altered on the vent.   SUBJECTIVE: See HPI.    VITAL SIGNS: Pulse Rate:  [103-106] 103 (03/31 2249) Resp:  [20-31] 31 (03/31 2249) BP: (162-180)/(39-86) 162/52 mmHg (03/31 2249) SpO2:  [93 %-98 %] 93 % (03/31 2249) FiO2 (%):  [50 %-100 %] 100 % (03/31 2246)  HEMODYNAMICS:    VENTILATOR SETTINGS: Vent Mode:  [-] PRVC FiO2 (%):  [50 %-100 %] 100 % Set Rate:  [22 bmp] 22 bmp Vt Set:  [460 mL] 460 mL PEEP:  [5 cmH20] 5 cmH20 Plateau Pressure:  [28 cmH20] 28 cmH20  INTAKE / OUTPUT: Intake/Output   None     PHYSICAL EXAMINATION: General:  Morbidly obese female in NAD on vent  Neuro:  Sedated HEENT:  OETT, mm pink/moist Cardiovascular:  s1s2 rrr, no m/r/g Lungs:  resp's even/non-labored, lungs with faint crackles lower Abdomen:  Round/soft, bsx4 hypoactive  Musculoskeletal:  No acute deformities  Skin:  Warm/dry, no edema   LABS:  CBC  Recent Labs Lab 02/24/14 2220  WBC 24.2*  HGB 8.8*  HCT 28.3*  PLT 410*   Coag's No results found for this basename: APTT, INR,  in the last 168 hours  BMET No results found for this basename: NA, K, CL, CO2, BUN, CREATININE, GLUCOSE,  in the last 168 hours  Electrolytes No results found for this basename: CALCIUM, MG, PHOS,  in the last 168 hours  Sepsis Markers  Recent Labs Lab 02/24/14 2236  LATICACIDVEN 0.90   ABG No results found for this basename: PHART, PCO2ART, PO2ART,  in the last 168 hours  Liver Enzymes No results found for this basename: AST, ALT, ALKPHOS, BILITOT, ALBUMIN,  in the last 168 hours  Cardiac Enzymes No results found for this basename: TROPONINI, PROBNP,  in the last 168 hours  Glucose  Recent Labs Lab 02/24/14 2212  GLUCAP 466*    Imaging Dg Chest Portable 1 View  02/24/2014   CLINICAL DATA:  Respiratory distress.  EXAM: PORTABLE CHEST - 1 VIEW  COMPARISON:  01/26/2014  FINDINGS: Endotracheal tube ends in the lower thoracic trachea, but above the carina (which is difficult  visualize) likely by 2 or 3 cm. Enteric tube crosses the diaphragm.  Chronic cardiopericardial enlargement. Diffuse interstitial coarsening with Kerley B-lines. Trace bilateral pleural effusion. Perihilar airspace opacity. No evidence of pneumothorax.  IMPRESSION: 1. Endotracheal and orogastric tubes are in good position. 2. CHF.   Electronically Signed   By: Jorje Guild M.D.   On: 02/24/2014 22:42   ASSESSMENT / PLAN:  PULMONARY A: Acute Hypercarbic Respiratory Failure Pulmonary Edema  COPD R/O HCAP - low clinical suspicion on admit P:   - PCV (air trapping)  - Trend ABG, CXR - Diuresis 40 q8 x 2 doses - Scheduled BD's - Solumedrol 40 q8. - Daily SBT / WUA  CARDIOVASCULAR A:  CHF - with acute decompensation.  ECHO in 12/2103 with EF 60%, mild LVH, nml wall motion, PAS 53 Hypertension  HLD Hx PAF P:  - Diuresis as above - Hold flecanide, pradaxa  - Heparin gtt per pharmacy - unable to administer pradaxa via tube - Continue cardizem (changed from CD dosing to IR, on 60 mg less than home dose) - Assess EKG, troponin - Echo as above  RENAL A:   Hyponatremia Hypochloremia P:   - Trend BMP - Lasix 40 mg IV q8 hours x2 doses. - Address electrolytes as indicated.  GASTROINTESTINAL A:   GERD P:   - OGT - Consider feeding in am if not extubated  - Pepcid for SUP - NPO  HEMATOLOGIC A:   Leukocytosis - likely in setting of steroid administration prior to presentation P:  - Monitor CBC trend - See ID - Heparin for DVT proph  INFECTIOUS A:   R/O HCAP - low clinical suspicion.  However elevated wbc on admit (may be related to EMS administered steroids) P:   - Cultures as above - Empiric vanco/zosyn given WBC, low threshold to d/c.  CXR c/w pulmonary edema without overt infiltrate  ENDOCRINE A:   DM P:   - SSI  - Consider long acting pending glucose trend   NEUROLOGIC A:   Acute  Encephalopathy  P:   - Monitor neurological exams - Propofol for sedation  + PRN fentanyl   [name], NP-C [place] [facility] Pgr: [phone] or [phone]  COPD and CHF exacerbation, steroids, abx, diureses, echo, r/o MI, once family is available will need to address code status and EOL as patient have been DNR due to ES-COPD and CHF.  I have personally obtained a history, examined the patient, evaluated laboratory and imaging results, formulated the assessment and plan and placed orders.  CRITICAL CARE: The patient is critically ill with multiple organ systems failure and requires high complexity decision making for assessment and support, frequent evaluation and titration of therapies, application of advanced monitoring technologies and extensive interpretation of multiple databases. Critical Care Time devoted to patient care services described in this note is 90 minutes.   02/24/2014, 11:03 PM  Rush Farmer, M.D. [facility] Pulmonary/Critical Care Medicine. Pager: [phone]. After hours pager: [phone].

## 2014-02-25 ENCOUNTER — Inpatient Hospital Stay (HOSPITAL_COMMUNITY): Payer: Medicare Other

## 2014-02-25 ENCOUNTER — Encounter (HOSPITAL_COMMUNITY): Payer: Self-pay | Admitting: *Deleted

## 2014-02-25 DIAGNOSIS — J811 Chronic pulmonary edema: Secondary | ICD-10-CM

## 2014-02-25 DIAGNOSIS — J438 Other emphysema: Secondary | ICD-10-CM

## 2014-02-25 LAB — CBC
HCT: 24.5 % — ABNORMAL LOW (ref 36.0–46.0)
HEMOGLOBIN: 7.6 g/dL — AB (ref 12.0–15.0)
MCH: 28 pg (ref 26.0–34.0)
MCHC: 31 g/dL (ref 30.0–36.0)
MCV: 90.4 fL (ref 78.0–100.0)
PLATELETS: 298 10*3/uL (ref 150–400)
RBC: 2.71 MIL/uL — AB (ref 3.87–5.11)
RDW: 17.5 % — ABNORMAL HIGH (ref 11.5–15.5)
WBC: 23.7 10*3/uL — ABNORMAL HIGH (ref 4.0–10.5)

## 2014-02-25 LAB — BLOOD GAS, ARTERIAL
ACID-BASE EXCESS: 2.7 mmol/L — AB (ref 0.0–2.0)
Acid-Base Excess: 1.1 mmol/L (ref 0.0–2.0)
BICARBONATE: 25.7 meq/L — AB (ref 20.0–24.0)
BICARBONATE: 25.8 meq/L — AB (ref 20.0–24.0)
Drawn by: 39866
Drawn by: 39866
FIO2: 0.4 %
FIO2: 0.5 %
O2 SAT: 96.6 %
O2 Saturation: 97.5 %
PATIENT TEMPERATURE: 97.8
PCO2 ART: 43.3 mmHg (ref 35.0–45.0)
PEEP/CPAP: 5 cmH2O
PEEP: 5 cmH2O
PO2 ART: 72.7 mmHg — AB (ref 80.0–100.0)
PRESSURE CONTROL: 35 cmH2O
Patient temperature: 96.8
Pressure control: 35 cmH2O
RATE: 35 resp/min
RATE: 35 resp/min
TCO2: 26.7 mmol/L (ref 0–100)
TCO2: 27.2 mmol/L (ref 0–100)
pCO2 arterial: 31.7 mmHg — ABNORMAL LOW (ref 35.0–45.0)
pH, Arterial: 7.386 (ref 7.350–7.450)
pH, Arterial: 7.517 — ABNORMAL HIGH (ref 7.350–7.450)
pO2, Arterial: 91.3 mmHg (ref 80.0–100.0)

## 2014-02-25 LAB — GLUCOSE, CAPILLARY
GLUCOSE-CAPILLARY: 588 mg/dL — AB (ref 70–99)
GLUCOSE-CAPILLARY: 584 mg/dL — AB (ref 70–99)
GLUCOSE-CAPILLARY: 512 mg/dL — AB (ref 70–99)
GLUCOSE-CAPILLARY: 307 mg/dL — AB (ref 70–99)
GLUCOSE-CAPILLARY: 109 mg/dL — AB (ref 70–99)
GLUCOSE-CAPILLARY: 158 mg/dL — AB (ref 70–99)
GLUCOSE-CAPILLARY: 140 mg/dL — AB (ref 70–99)
Glucose-Capillary: 121 mg/dL — ABNORMAL HIGH (ref 70–99)
Glucose-Capillary: 129 mg/dL — ABNORMAL HIGH (ref 70–99)
Glucose-Capillary: 142 mg/dL — ABNORMAL HIGH (ref 70–99)
Glucose-Capillary: 149 mg/dL — ABNORMAL HIGH (ref 70–99)
Glucose-Capillary: 157 mg/dL — ABNORMAL HIGH (ref 70–99)
Glucose-Capillary: 182 mg/dL — ABNORMAL HIGH (ref 70–99)
Glucose-Capillary: 219 mg/dL — ABNORMAL HIGH (ref 70–99)
Glucose-Capillary: 361 mg/dL — ABNORMAL HIGH (ref 70–99)
Glucose-Capillary: 393 mg/dL — ABNORMAL HIGH (ref 70–99)
Glucose-Capillary: 397 mg/dL — ABNORMAL HIGH (ref 70–99)
Glucose-Capillary: 481 mg/dL — ABNORMAL HIGH (ref 70–99)
Glucose-Capillary: 508 mg/dL — ABNORMAL HIGH (ref 70–99)
Glucose-Capillary: 583 mg/dL (ref 70–99)

## 2014-02-25 LAB — BASIC METABOLIC PANEL
BUN: 24 mg/dL — ABNORMAL HIGH (ref 6–23)
CALCIUM: 8.7 mg/dL (ref 8.4–10.5)
CO2: 22 mEq/L (ref 19–32)
Chloride: 86 mEq/L — ABNORMAL LOW (ref 96–112)
Creatinine, Ser: 1.23 mg/dL — ABNORMAL HIGH (ref 0.50–1.10)
GFR calc Af Amer: 51 mL/min — ABNORMAL LOW (ref >90)
GFR calc non Af Amer: 44 mL/min — ABNORMAL LOW (ref >90)
GLUCOSE: 628 mg/dL — AB (ref 70–99)
POTASSIUM: 4.5 meq/L (ref 3.7–5.3)
SODIUM: 130 meq/L — AB (ref 137–147)

## 2014-02-25 LAB — TRIGLYCERIDES: Triglycerides: 127 mg/dL (ref <150)

## 2014-02-25 LAB — STREP PNEUMONIAE URINARY ANTIGEN: STREP PNEUMO URINARY ANTIGEN: NEGATIVE

## 2014-02-25 LAB — TROPONIN I
TROPONIN I: 0.75 ng/mL — AB (ref <0.30)
Troponin I: <0.3 ng/mL (ref <0.30)

## 2014-02-25 LAB — HEPARIN LEVEL (UNFRACTIONATED): HEPARIN UNFRACTIONATED: 0.17 [IU]/mL — AB (ref 0.30–0.70)

## 2014-02-25 LAB — MRSA PCR SCREENING: MRSA BY PCR: NEGATIVE

## 2014-02-25 LAB — PRO B NATRIURETIC PEPTIDE: Pro B Natriuretic peptide (BNP): 1615 pg/mL — ABNORMAL HIGH (ref 0–125)

## 2014-02-25 MED ORDER — FUROSEMIDE 10 MG/ML IJ SOLN
40.0000 mg | Freq: Once | INTRAMUSCULAR | Status: AC
  Administered 2014-02-25: 40 mg via INTRAVENOUS
  Filled 2014-02-25: qty 4

## 2014-02-25 MED ORDER — INSULIN ASPART 100 UNIT/ML ~~LOC~~ SOLN
2.0000 [IU] | SUBCUTANEOUS | Status: DC
  Administered 2014-02-25: 4 [IU] via SUBCUTANEOUS
  Administered 2014-02-25: 2 [IU] via SUBCUTANEOUS
  Administered 2014-02-26 (×2): 6 [IU] via SUBCUTANEOUS

## 2014-02-25 MED ORDER — HEPARIN BOLUS VIA INFUSION
2000.0000 [IU] | Freq: Once | INTRAVENOUS | Status: AC
  Administered 2014-02-25: 2000 [IU] via INTRAVENOUS
  Filled 2014-02-25: qty 2000

## 2014-02-25 MED ORDER — PIPERACILLIN-TAZOBACTAM 3.375 G IVPB
3.3750 g | Freq: Three times a day (TID) | INTRAVENOUS | Status: DC
  Administered 2014-02-25: 3.375 g via INTRAVENOUS
  Filled 2014-02-25 (×3): qty 50

## 2014-02-25 MED ORDER — ALBUTEROL SULFATE (2.5 MG/3ML) 0.083% IN NEBU
2.5000 mg | INHALATION_SOLUTION | Freq: Four times a day (QID) | RESPIRATORY_TRACT | Status: DC
  Administered 2014-02-25 – 2014-02-26 (×5): 2.5 mg via RESPIRATORY_TRACT
  Filled 2014-02-25 (×5): qty 3

## 2014-02-25 MED ORDER — METHYLPREDNISOLONE SODIUM SUCC 40 MG IJ SOLR
40.0000 mg | Freq: Three times a day (TID) | INTRAMUSCULAR | Status: DC
  Administered 2014-02-25 (×2): 40 mg via INTRAVENOUS
  Filled 2014-02-25 (×5): qty 1

## 2014-02-25 MED ORDER — BUDESONIDE 0.5 MG/2ML IN SUSP
0.5000 mg | Freq: Four times a day (QID) | RESPIRATORY_TRACT | Status: DC
  Administered 2014-02-25 – 2014-02-27 (×7): 0.5 mg via RESPIRATORY_TRACT
  Filled 2014-02-25 (×11): qty 2

## 2014-02-25 MED ORDER — PANTOPRAZOLE SODIUM 40 MG PO TBEC
40.0000 mg | DELAYED_RELEASE_TABLET | Freq: Every day | ORAL | Status: DC
  Administered 2014-02-26 – 2014-03-12 (×15): 40 mg via ORAL
  Filled 2014-02-25 (×13): qty 1

## 2014-02-25 MED ORDER — VANCOMYCIN HCL IN DEXTROSE 1-5 GM/200ML-% IV SOLN
1000.0000 mg | Freq: Two times a day (BID) | INTRAVENOUS | Status: DC
  Filled 2014-02-25: qty 200

## 2014-02-25 MED ORDER — HEPARIN (PORCINE) IN NACL 100-0.45 UNIT/ML-% IJ SOLN
1500.0000 [IU]/h | INTRAMUSCULAR | Status: DC
Start: 2014-02-25 — End: 2014-02-26
  Administered 2014-02-25: 1200 [IU]/h via INTRAVENOUS
  Administered 2014-02-26: 1500 [IU]/h via INTRAVENOUS
  Filled 2014-02-25 (×3): qty 250

## 2014-02-25 MED ORDER — SODIUM CHLORIDE 0.9 % IV SOLN
0.0000 ug/h | INTRAVENOUS | Status: DC
  Administered 2014-02-25: 50 ug/h via INTRAVENOUS
  Filled 2014-02-25: qty 50

## 2014-02-25 MED ORDER — FENTANYL CITRATE 0.05 MG/ML IJ SOLN
50.0000 ug | INTRAMUSCULAR | Status: DC | PRN
  Administered 2014-02-25: 200 ug via INTRAVENOUS
  Filled 2014-02-25: qty 4

## 2014-02-25 MED ORDER — SODIUM CHLORIDE 0.9 % IV SOLN
INTRAVENOUS | Status: DC
  Administered 2014-02-25: 5.3 [IU]/h via INTRAVENOUS
  Administered 2014-02-25: 16.9 [IU]/h via INTRAVENOUS
  Filled 2014-02-25 (×2): qty 1

## 2014-02-25 MED ORDER — BUDESONIDE 0.5 MG/2ML IN SUSP
0.5000 mg | Freq: Four times a day (QID) | RESPIRATORY_TRACT | Status: DC
  Administered 2014-02-25: 0.5 mg via RESPIRATORY_TRACT
  Filled 2014-02-25 (×4): qty 2

## 2014-02-25 MED ORDER — FENTANYL CITRATE 0.05 MG/ML IJ SOLN: 12.5000 ug | INTRAMUSCULAR | Status: DC | PRN

## 2014-02-25 NOTE — Progress Notes (Signed)
CRITICAL VALUE ALERT  Critical value received:  Troponin 0.75  Date of notification:  02/25/14   Time of notification:  10:45     Critical value read back:yes  Nurse who received alert:  Oneita Jolly RN  MD notified (1st page):  Dr. Alva Garnet  Time of first page: 10:45   MD notified (2nd page):  Time of second page:  Responding MD:  Dr. Leonidas Romberg  Time MD responded:  10:45

## 2014-02-25 NOTE — Procedures (Signed)
Extubation Procedure Note  Patient Details:   Name: SURIAH PERAGINE DOB: 1946/04/03 MRN: [id number]   Airway Documentation:     Evaluation  O2 sats: stable throughout Complications: No apparent complications Patient did tolerate procedure well. Bilateral Breath Sounds: Expiratory wheezes Suctioning: Airway Yes Pt extubated to 4lpm [place], pt verbal, sx prior to, no distress noted. Incentive 750cc's.  Leona Carry [facility] 02/25/2014, 12:08 PM

## 2014-02-25 NOTE — Progress Notes (Signed)
eLink Physician-Brief Progress Note Patient Name: Paige Hardin DOB: Aug 12, 1946 MRN: [id number]  Date of Service  02/25/2014   HPI/Events of Note  Sugars 500s   eICU Interventions  Start phas 2 ICU hyperglycemia protocol   Intervention Category Intermediate Interventions: Hyperglycemia - evaluation and treatment  Issa Kosmicki 02/25/2014, 12:44 AM

## 2014-02-25 NOTE — Progress Notes (Signed)
ANTICOAGULATION CONSULT NOTE - Follow Up Consult  Pharmacy Consult for Heparin Indication: atrial fibrillation  Allergies  Allergen Reactions  . Ace Inhibitors Cough  . Clindamycin Rash    Patient Measurements: Height: 5\' 5"  (165.1 cm) Weight: 308 lb 6.8 oz (139.9 kg) IBW/kg (Calculated) : 57 Heparin Dosing Weight: 90 kg  Vital Signs: Temp: 98.9 F (37.2 C) (04/01 1600) Temp src: Core (Comment) (04/01 1600) BP: 130/46 mmHg (04/01 1600) Pulse Rate: 94 (04/01 1600)  Labs:  Recent Labs  02/24/14 2220 02/24/14 2247 02/25/14 0205 02/25/14 1000 02/25/14 1700  HGB 8.8*  --  7.6*  --   --   HCT 28.3*  --  24.5*  --   --   PLT 410*  --  298  --   --   LABPROT 15.6*  --   --   --   --   INR 1.27  --   --   --   --   HEPARINUNFRC  --   --   --   --  0.17*  CREATININE 1.06  --  1.23*  --   --   TROPONINI  --  <0.30 <0.30 0.75*  --     Estimated Creatinine Clearance: 62.3 ml/min (by C-G formula based on Cr of 1.23).  Assessment:   Pradaxa on hold while intubated.   Initial heparin level is subtherapeutic (0.17) on 1200 units/hr.  Goal of Therapy:  Heparin level 0.3-0.7 units/ml Monitor platelets by anticoagulation protocol: Yes   Plan:   Increase heparin drip to 1500 units/hr.  Next heparin level in ~ 8hrs.  Daily heparin level and CBC while on Heparin.  Arty Baumgartner, [place] Pager: [phone] 02/25/2014,6:10 PM

## 2014-02-25 NOTE — Progress Notes (Signed)
ANTICOAGULATION and ANTIBIOTIC CONSULT NOTE - Initial Consult  Pharmacy Consult for Vancomycin/Zosyn  Indication: r/o PNA  Pharmacy Consult for Heparin  Indication: Hx Afib (pradaxa PTA)  Allergies  Allergen Reactions  . Ace Inhibitors Cough  . Clindamycin Rash    Patient Measurements: Height: 5\' 5"  (165.1 cm) Weight: 298 lb 15.1 oz (135.6 kg) IBW/kg (Calculated) : 57 Heparin Dosing Weight: 90 kg  Vital Signs: BP: 133/47 mmHg (03/31 2345) Pulse Rate: 106 (03/31 2345)  Labs:  Recent Labs  02/24/14 2220 02/24/14 2247  HGB 8.8*  --   HCT 28.3*  --   PLT 410*  --   LABPROT 15.6*  --   INR 1.27  --   CREATININE 1.06  --   TROPONINI  --  <0.30    Estimated Creatinine Clearance: 70.9 ml/min (by C-G formula based on Cr of 1.06).   Medical History: Past Medical History  Diagnosis Date  . Chronic low back pain   . Emphysema   . Paroxysmal atrial fibrillation   . COPD (chronic obstructive pulmonary disease)   . Hyperlipidemia   . Diastolic congestive heart failure   . Obese   . Carotid atherosclerosis   . Lung nodules   . Hoarseness, chronic   . Pneumonia 1990's    "once"  . Type 2 diabetes mellitus   . GERD (gastroesophageal reflux disease)   . Arthritis     "joints" (01/14/2014)  . Depression     "maybe" (01/14/2014    Medications: Pradaxa PTA (last dose 3/21 at 2000)  Assessment: 68 y/o F here with respiratory distress (intubated). To start vancomycin/zosyn for r/o PNA. WBC 24.2, Scr 1.06, to early to assess UOP. Also on pradaxa PTA for afib, to switch to heparin while intubated--anticoag labs as above (noted low Hgb of 8.8--no overt bleeding noted).   Goal of Therapy:  Heparin level 0.3-0.7 units/ml Monitor platelets by anticoagulation protocol: Yes   Plan:  -Vancomycin 1000 mg IV q12h -Zosyn 3.375G IV q8h to be infused over 4 hours -Trend WBC, temp, renal function  -Drug levels as indicated  -Heparin 2000 units BOLUS at 0800 (1/2 bolus with low  hgb) -Start heparin drip at 1200 units/hr -1600 HL -Daily CBC/HL -Trend Hgb  Narda Bonds 02/25/2014,12:57 AM

## 2014-02-25 NOTE — Procedures (Signed)
Central Venous Catheter Insertion Procedure Note Paige Hardin [id number] December 23, 1945  Procedure: Insertion of Central Venous Catheter Indications: Drug and/or fluid administration and Frequent blood sampling  Procedure Details Consent: Unable to obtain consent because of emergent medical necessity. Time Out: Verified patient identification, verified procedure, site/side was marked, verified correct patient position, special equipment/implants available, medications/allergies/relevent history reviewed, required imaging and test results available.  Performed  Maximum sterile technique was used including antiseptics, cap, gloves, gown, hand hygiene, mask and sheet. Skin prep: Chlorhexidine; local anesthetic administered A antimicrobial bonded/coated triple lumen catheter was placed in the left internal jugular vein using the Seldinger technique. Ultrasound guidance used.yes Catheter placed to 20 cm. Blood aspirated via all 3 ports and then flushed x 3. Line sutured x 2 and dressing applied.  Evaluation Blood flow good Complications: No apparent complications Patient did tolerate procedure well. Chest X-ray ordered to verify placement.  CXR: pending.  Georgann Housekeeper, ACNP [place] [facility] Pager [phone] or [phone]  U/S used in placement.  I was present and supervised procedure.  Rush Farmer, M.D. [facility] Pulmonary/Critical Care Medicine. Pager: [phone]. After hours pager: [phone].

## 2014-02-25 NOTE — Progress Notes (Signed)
UR Completed.  Vergie Living [phone] 02/25/2014

## 2014-02-25 NOTE — Progress Notes (Signed)
Name: Paige Hardin MRN: [id number] DOB: 07/22/1946 LOS: 1  PCCM PROGRESS NOTE  Brief admission history: The patient is 68 yo lady with PMH of COPD and dCHF with EF of 60%, who was admitted due to combiced COPD and CHF exacerbation on 02/24/14.  She was intubated and has been treated with steroids, abx, diureses.   SIGNIFICANT EVENTS / STUDIES:   3/31 - worsening SOB at SNF despite multiple albuterol Rx, intubated in ER.  CXR c/w edema on admit 4/1- Cre 1.06--> 1.23 4/1- XCR: No pneumothorax. CHF with mild improvement since yesterday.   LINES / TUBES: OETT 3/31>>>  CULTURES: UA 3/31>> negative for UTI UC 3/31>> U. Strep Antigen 3/31>> BCx2 3/31>>  ANTIBIOTICS: Vanco 3/31 (empiric)>>4/1 Zosyn 3/31 (empiric)>>4/1   Allergies Allergies  Allergen Reactions  . Ace Inhibitors Cough  . Clindamycin Rash    Vital Signs: Filed Vitals:   02/25/14 0600  BP: 126/44  Pulse: 95  Temp: 98.1 F (36.7 C)  Resp: 20   I/O last 3 completed shifts: In: 1063.5 [I.V.:463.5; IV [id number]:347] Out: 1225 [Urine:1225]  negative 161 ml since admission  Physical Examination:  General: intubated not in acute distress HEENT: PERRL, EOMI, no scleral icterus Cardiac: S1/S2, RRR, No Murmurs, No gallops or rubs Pulm: diffused rhonchi bilaterally Abd: Soft,  nondistended, nontender, no rebound pain, no organomegaly, BS present Ext: 1+ pitting leg edema bilaterally Neuro: alert and oriented X3, moves all extremeties,  sensation to light touch intact.  Skin: No rashes  Lab Results: Basic Metabolic Panel: Subjective: The patient is intubated and alert. Wants to be extubated.  No fever or chest pain.  PULMONARY A: Acute Hypercarbic Respiratory Failure-->now reparatory alkalosis.  Pulmonary Edema : improved COPD R/O HCAP - low clinical suspicion on admit, unlikely now Pending Blood and respiratory culturs  P:   - Continue diuresis lasix 40 mg IV X 1 - Scheduled BD's - switch Solumedrol  to Pulmicort neb - Extubated today  CARDIOVASCULAR A:   CHF - with acute decompensation, improved. ECHO 12/2103 with EF 60% and mild LVH Hypertension resolved  HLD Hx PAF P:  - Diuresis as above - Hold flecanide, pradaxa   - Heparin gtt per pharmacy - unable to administer pradaxa via tube (home pradax) - Continue cardizem (changed from CD dosing to IR, on 60 mg less than home dose)  RENAL A:    Hyponatremia Hypochloremia P:   - Trend BMP - Address electrolytes as indicated.  GASTROINTESTINAL A:    GERD P:    - start Protonix 40 mg daily (was on lansoprazole 30 MG daily at home)  - NPO  HEMATOLOGIC A:    Leukocytosis - likely in setting of steroid administration prior to presentation P:  - Monitor CBC trend - Heparin for DVT proph  INFECTIOUS A:    R/O HCAP - unlikely.  The elevated wbc on admit was ilkely due to steroid use. P:   - Cultures pending  - d/c vanco/zosyn   ENDOCRINE A:    DM-II: A1c 6.7.  CBG hight in AM, likely due to IV solumedrol, which will be d/c'ed. expecting improvement P:  - CBG q4h  NEUROLOGIC A:    Acute Encephalopathy: improved P:   - Monitor neurological exams   I have personally obtained a history, examined the patient, evaluated laboratory and imaging results, formulated the assessment and plan and placed orders.  CRITICAL CARE: The patient is critically ill with multiple organ systems failure and requires high complexity decision making  for assessment and support, frequent evaluation and titration of therapies, application of advanced monitoring technologies and extensive interpretation of multiple databases. Critical Care Time devoted to patient care services described in this note is 30 minutes.     Merton Border, MD ; [facility] [phone].  After 5:30 PM or weekends, call [phone]   Seen with: Ivor Costa 02/25/2014, 8:35 AM

## 2014-02-25 NOTE — Progress Notes (Signed)
ABG reviewed.  Rate adjusted to 20.  Discussed with RT.    [name], NP-C [place] [facility] Pgr: [phone] or [phone]

## 2014-02-25 NOTE — ED Provider Notes (Signed)
I saw and evaluated the patient, reviewed the resident's note and I agree with the findings and plan.   EKG Interpretation   Date/Time:  Tuesday February 24 2014 22:16:08 EDT Ventricular Rate:  111 PR Interval:  162 QRS Duration: 111 QT Interval:  364 QTC Calculation: 495 R Axis:   52 Text Interpretation:  Sinus tachycardia Abnormal R-wave progression, early  transition Borderline prolonged QT interval Nonspecific T wave abnormality  Confirmed by Jaelene Garciagarcia  MD, Kani Chauvin ([id number]) on 02/24/2014 10:54:01 PM      Patient indicated to EMS prior to unresponsiveness she would want intubation despite her DNR status. Intubated for airway protection. No significant hypoxia during resuscitation. Discussed with critical care, they will order antibiotics. I was present for and directly supervised the intubation.  CRITICAL CARE Performed by: Sherwood Gambler T   Total critical care time: 30 minutes  Critical care time was exclusive of separately billable procedures and treating other patients.  Critical care was necessary to treat or prevent imminent or life-threatening deterioration.  Critical care was time spent personally by me on the following activities: development of treatment plan with patient and/or surrogate as well as nursing, discussions with consultants, evaluation of patient's response to treatment, examination of patient, obtaining history from patient or surrogate, ordering and performing treatments and interventions, ordering and review of laboratory studies, ordering and review of radiographic studies, pulse oximetry and re-evaluation of patient's condition.   Ephraim Hamburger, MD 02/25/14 [phone]

## 2014-02-26 ENCOUNTER — Inpatient Hospital Stay (HOSPITAL_COMMUNITY): Payer: Medicare Other

## 2014-02-26 DIAGNOSIS — I4891 Unspecified atrial fibrillation: Secondary | ICD-10-CM

## 2014-02-26 LAB — BASIC METABOLIC PANEL
BUN: 29 mg/dL — ABNORMAL HIGH (ref 6–23)
CHLORIDE: 95 meq/L — AB (ref 96–112)
CO2: 29 meq/L (ref 19–32)
CREATININE: 1.11 mg/dL — AB (ref 0.50–1.10)
Calcium: 9.1 mg/dL (ref 8.4–10.5)
GFR calc Af Amer: 58 mL/min — ABNORMAL LOW (ref >90)
GFR calc non Af Amer: 50 mL/min — ABNORMAL LOW (ref >90)
Glucose, Bld: 213 mg/dL — ABNORMAL HIGH (ref 70–99)
Potassium: 4.4 mEq/L (ref 3.7–5.3)
SODIUM: 138 meq/L (ref 137–147)

## 2014-02-26 LAB — CBC
HEMATOCRIT: 23.7 % — AB (ref 36.0–46.0)
Hemoglobin: 7.3 g/dL — ABNORMAL LOW (ref 12.0–15.0)
MCH: 27.9 pg (ref 26.0–34.0)
MCHC: 30.8 g/dL (ref 30.0–36.0)
MCV: 90.5 fL (ref 78.0–100.0)
Platelets: 273 10*3/uL (ref 150–400)
RBC: 2.62 MIL/uL — AB (ref 3.87–5.11)
RDW: 17.6 % — ABNORMAL HIGH (ref 11.5–15.5)
WBC: 23.9 10*3/uL — AB (ref 4.0–10.5)

## 2014-02-26 LAB — URINE CULTURE
Colony Count: NO GROWTH
Culture: NO GROWTH

## 2014-02-26 LAB — GLUCOSE, CAPILLARY
GLUCOSE-CAPILLARY: 215 mg/dL — AB (ref 70–99)
Glucose-Capillary: 216 mg/dL — ABNORMAL HIGH (ref 70–99)
Glucose-Capillary: 216 mg/dL — ABNORMAL HIGH (ref 70–99)
Glucose-Capillary: 219 mg/dL — ABNORMAL HIGH (ref 70–99)
Glucose-Capillary: 172 mg/dL — ABNORMAL HIGH (ref 70–99)

## 2014-02-26 LAB — TROPONIN I: Troponin I: 0.39 ng/mL (ref <0.30)

## 2014-02-26 LAB — HEPARIN LEVEL (UNFRACTIONATED)
HEPARIN UNFRACTIONATED: 0.27 [IU]/mL — AB (ref 0.30–0.70)
Heparin Unfractionated: 0.33 IU/mL (ref 0.30–0.70)

## 2014-02-26 MED ORDER — LORAZEPAM 2 MG/ML IJ SOLN
0.5000 mg | INTRAMUSCULAR | Status: DC | PRN
  Administered 2014-03-07: 0.5 mg via INTRAVENOUS
  Filled 2014-02-26: qty 1

## 2014-02-26 MED ORDER — DABIGATRAN ETEXILATE MESYLATE 150 MG PO CAPS
150.0000 mg | ORAL_CAPSULE | Freq: Two times a day (BID) | ORAL | Status: DC
  Administered 2014-02-26 – 2014-03-12 (×29): 150 mg via ORAL
  Filled 2014-02-26 (×30): qty 1

## 2014-02-26 MED ORDER — FUROSEMIDE 10 MG/ML IJ SOLN
INTRAMUSCULAR | Status: AC
  Administered 2014-02-26: 40 mg via INTRAVENOUS
  Filled 2014-02-26: qty 4

## 2014-02-26 MED ORDER — IPRATROPIUM-ALBUTEROL 0.5-2.5 (3) MG/3ML IN SOLN
3.0000 mL | Freq: Four times a day (QID) | RESPIRATORY_TRACT | Status: DC
  Administered 2014-02-26 – 2014-02-27 (×5): 3 mL via RESPIRATORY_TRACT
  Filled 2014-02-26 (×4): qty 3

## 2014-02-26 MED ORDER — INSULIN ASPART 100 UNIT/ML ~~LOC~~ SOLN
0.0000 [IU] | SUBCUTANEOUS | Status: DC
  Administered 2014-02-26: 3 [IU] via SUBCUTANEOUS
  Administered 2014-02-26: 2 [IU] via SUBCUTANEOUS
  Administered 2014-02-26: 6 [IU] via SUBCUTANEOUS
  Administered 2014-02-27 (×2): 2 [IU] via SUBCUTANEOUS
  Administered 2014-02-27 (×2): 3 [IU] via SUBCUTANEOUS
  Administered 2014-02-27: 5 [IU] via SUBCUTANEOUS
  Administered 2014-02-27: 2 [IU] via SUBCUTANEOUS
  Administered 2014-02-28: 9 [IU] via SUBCUTANEOUS
  Administered 2014-02-28 (×2): 3 [IU] via SUBCUTANEOUS
  Administered 2014-02-28: 2 [IU] via SUBCUTANEOUS
  Administered 2014-02-28: 7 [IU] via SUBCUTANEOUS
  Administered 2014-02-28 – 2014-03-01 (×2): 5 [IU] via SUBCUTANEOUS
  Administered 2014-03-01: 3 [IU] via SUBCUTANEOUS
  Administered 2014-03-01: 1 [IU] via SUBCUTANEOUS
  Administered 2014-03-01: 7 [IU] via SUBCUTANEOUS
  Administered 2014-03-01: 5 [IU] via SUBCUTANEOUS
  Administered 2014-03-01: 2 [IU] via SUBCUTANEOUS
  Administered 2014-03-02: 3 [IU] via SUBCUTANEOUS
  Administered 2014-03-02: 2 [IU] via SUBCUTANEOUS
  Administered 2014-03-02: 7 [IU] via SUBCUTANEOUS
  Administered 2014-03-02: 1 [IU] via SUBCUTANEOUS

## 2014-02-26 MED ORDER — OXYMETAZOLINE HCL 0.05 % NA SOLN
1.0000 | Freq: Two times a day (BID) | NASAL | Status: AC | PRN
  Administered 2014-02-26: 1 via NASAL
  Filled 2014-02-26: qty 15

## 2014-02-26 MED ORDER — HEPARIN (PORCINE) IN NACL 100-0.45 UNIT/ML-% IJ SOLN
1700.0000 [IU]/h | INTRAMUSCULAR | Status: DC
  Filled 2014-02-26: qty 250

## 2014-02-26 MED ORDER — GLYBURIDE MICRONIZED 3 MG PO TABS
3.0000 mg | ORAL_TABLET | Freq: Every day | ORAL | Status: DC
  Administered 2014-02-26 – 2014-02-28 (×3): 3 mg via ORAL
  Filled 2014-02-26 (×4): qty 1

## 2014-02-26 MED ORDER — FUROSEMIDE 40 MG PO TABS
40.0000 mg | ORAL_TABLET | Freq: Two times a day (BID) | ORAL | Status: DC
  Filled 2014-02-26 (×4): qty 1

## 2014-02-26 MED ORDER — FUROSEMIDE 10 MG/ML IJ SOLN
40.0000 mg | Freq: Once | INTRAMUSCULAR | Status: AC
  Administered 2014-02-26: 40 mg via INTRAVENOUS

## 2014-02-26 NOTE — Progress Notes (Signed)
Inpatient Diabetes Program Recommendations  AACE/ADA: New Consensus Statement on Inpatient Glycemic Control (2013)  Target Ranges:  Prepandial:   less than 140 mg/dL      Peak postprandial:   less than 180 mg/dL (1-2 hours)      Critically ill patients:  140 - 180 mg/dL   Results for Paige Hardin, Paige Hardin (MRN [id number]) as of 02/26/2014 10:04  Ref. Range 02/25/2014 18:19 02/25/2014 19:28 02/25/2014 20:27 02/25/2014 23:38 02/26/2014 04:10 02/26/2014 07:37  Glucose-Capillary Latest Range: 70-99 mg/dL 158 (H) 140 (H) 142 (H) 182 (H) 216 (H) 216 (H)   Diabetes history: DM2 Outpatient Diabetes medications: Glyconase 3 mg daily Current orders for Inpatient glycemic control: Novolog 2-6 units Q4H  Inpatient Diabetes Program Recommendations Glycemic Order Set: May want to consider discontinuing ICU Glycemic order set and order regular Glycemic Control order set with Sensitive correction scale ACHS (if patient is eating and tolerating diet). Insulin - Basal: Please consider ordering Lantus 14 units daily (starting now) which is based on 140 kg x 0.1 units. Correction (SSI): If changed to regular glycemic control order set, recommend ordering sensitive correction scale ACHS.  Note: Noted patient was transitioned off insulin drip to SQ insulin yesterday around 20:36. However, no basal insulin was given at time of transition. Recommend ordering Lantus 14 units daily (starting now). May want to consider discontinuing ICU Glycemic order set and order regular Glycemic Control order set with CBGs and sensitive correction ACHS if patient is eating and tolerating diet.    Thanks, Barnie Alderman, RN, MSN, CCRN Diabetes Coordinator Inpatient Diabetes Program [phone] (Team Pager) [phone] ([facility] office) [phone] [facility] office)

## 2014-02-26 NOTE — Progress Notes (Signed)
eLink Physician-Brief Progress Note Patient Name: Paige Hardin DOB: November 13, 1946 MRN: [id number]  Date of Service  02/26/2014   HPI/Events of Note   Increased work of breathing O2 saturation normal Wheezing per RT  eICU Interventions  Lasix now cxr now Nebulizer now Hold transfer for now   Intervention Category Major Interventions: Respiratory failure - evaluation and management  Saquan Furtick 02/26/2014, 6:14 PM

## 2014-02-26 NOTE — Progress Notes (Signed)
ANTICOAGULATION CONSULT NOTE - Follow Up Consult  Pharmacy Consult for heparin Indication: atrial fibrillation  Labs:  Recent Labs  02/24/14 2220 02/24/14 2247 02/25/14 0205 02/25/14 1000 02/25/14 1700 02/26/14 0154  HGB 8.8*  --  7.6*  --   --  7.3*  HCT 28.3*  --  24.5*  --   --  23.7*  PLT 410*  --  298  --   --  273  LABPROT 15.6*  --   --   --   --   --   INR 1.27  --   --   --   --   --   HEPARINUNFRC  --   --   --   --  0.17* 0.33  CREATININE 1.06  --  1.23*  --   --  1.11*  TROPONINI  --  <0.30 <0.30 0.75*  --   --     Assessment/Plan: 68yo female now therapeutic on heparin after rate increase.  Hgb trending down.  Will continue heparin at current rate despite low end of goal and confirm stable with additional level.   Wynona Neat, PharmD, BCPS  02/26/2014,2:38 AM

## 2014-02-26 NOTE — Progress Notes (Signed)
eLink Physician-Brief Progress Note Patient Name: Paige Hardin DOB: 25-Dec-1945 MRN: [id number]  Date of Service  02/26/2014   HPI/Events of Note   Work of breathing slowly improving CXR with pulm edema Quite anxious  eICU Interventions  Ativan prn   Intervention Category Major Interventions: Respiratory failure - evaluation and management Minor Interventions: Agitation / anxiety - evaluation and management  Kareema Keitt 02/26/2014, 6:56 PM

## 2014-02-26 NOTE — Progress Notes (Addendum)
ANTICOAGULATION CONSULT NOTE - Follow Up Consult  Pharmacy Consult for heparin Indication: atrial fibrillation  Labs:  Recent Labs  02/24/14 2220  02/25/14 0205 02/25/14 1000 02/25/14 1700 02/26/14 0154 02/26/14 0800  HGB 8.8*  --  7.6*  --   --  7.3*  --   HCT 28.3*  --  24.5*  --   --  23.7*  --   PLT 410*  --  298  --   --  273  --   LABPROT 15.6*  --   --   --   --   --   --   INR 1.27  --   --   --   --   --   --   HEPARINUNFRC  --   --   --   --  0.17* 0.33 0.27*  CREATININE 1.06  --  1.23*  --   --  1.11*  --   TROPONINI  --   < > <0.30 0.75*  --   --  0.39*  < > = values in this interval not displayed.  Assessment/Plan: 68yo female on heparin while pradaxa on hold for a fib.  Last HL was therapeutic, but at the lower range.  Heparin dose was continued, but on HL confirmation, HL is  0.27 and subtherapeutic.  Will slightly increase heparin dose for now.  Hgb and Hct still low and stable with a normal Plt.  No significant bleeding noted.  Goal of Therapy:  Heparin level 0.3-0.7 units/ml  Monitor platelets by anticoagulation protocol: Yes  Plan:  Increase heparin drip to 1700 units/hr.  Next heparin level in ~ 8hrs at 1700 Daily heparin level and CBC while on Heparin  Jeronimo Norma, PharmD Clinical Pharmacist Pager: [phone]  02/26/2014,8:46 AM  ________________  Addendum:  Patient's heparin was stopped per medical team and restarted on PTA pradaxa for hx of a fib.

## 2014-02-26 NOTE — Progress Notes (Signed)
Name: Paige Hardin MRN: [id number] DOB: 27-Nov-1946 LOS: 2  PCCM PROGRESS NOTE  Brief admission history: The patient is 68 yo lady with PMH of COPD and dCHF with EF of 60%, who was admitted due to combiced COPD and CHF exacerbation on 02/24/14.  She was intubated and has been treated with steroids, abx, diureses.   SIGNIFICANT EVENTS / STUDIES:   3/31 - worsening SOB at SNF despite multiple albuterol Rx, intubated in ER.  CXR c/w edema on admit 4/1- XCR: No pneumothorax. CHF with mild improvement since yesterday.  4/1- Extubated and doing fine  LINES / TUBES: OETT 3/31>>>4/1  CULTURES: UA 3/31>> negative for UTI UC 3/31>>negative  U. Strep Antigen 3/31>>negative BCx2 3/31>>  ANTIBIOTICS: Vanco 3/31 (empiric)>>4/1 Zosyn 3/31 (empiric)>>4/1   Allergies Allergies  Allergen Reactions  . Ace Inhibitors Cough  . Clindamycin Rash    Vital Signs: Filed Vitals:   02/26/14 0600  BP: 114/43  Pulse: 87  Temp: 98 F (36.7 C)  Resp: 24   I/O last 3 completed shifts: In: 2637 [I.V.:801; NG/GT:30; IV Piggyback:600] Out: 2175 [Urine:2175]  negative 161 ml since admission  Physical Examination:  General: No Acute disdress HEENT: PERRL, EOMI, no scleral icterus Cardiac: S1/S2, RRR, No Murmurs, No gallops or rubs Pulm: scattered rhonchi bilaterally Abd: Soft,  nondistended, nontender, no rebound pain, no organomegaly, BS present Ext: 1+ pitting leg edema bilaterally Neuro: alert and oriented X3, moves all extremeties,  sensation to light touch intact.  Skin: No rashes  Lab Results: Basic Metabolic Panel:  Subjective: The patient has doing fine after extubation.  No fever or chest pain.  PULMONARY A: Acute Hypercarbic Respiratory Failure: resolved Pulmonary Edema  COPD Pending Blood and respiratory culturs  P:   - Start home dose of  Lasix 40 mg oral bid - Scheduled BD's - Extubated today   CARDIOVASCULAR A:   CHF exacerbation, improved. ECHO 12/2103 with EF 60%  and mild LVH Hypertension resolved  HLD Hx PAF Elevated Trop 0.75, No chest pain, likely due to demanding P:  - Diuresis as above - Hold flecanide   - d/c Heparin IV - start home dose of Pradaxa - Continue cardizem (changed from CD dosing to IR, on 60 mg less than home dose)  RENAL A:    Hypochloremia P:   - Trend BMP - Address electrolytes as indicated.  GASTROINTESTINAL A:    GERD P:    -  Protonix 40 mg daily (was on lansoprazole 30 MG daily at home)  -  Carb modified diet  HEMATOLOGIC A:    Leukocytosis - likely in setting of steroid administration prior to presentation P:  - Monitor CBC trend  INFECTIOUS A:    R/O HCAP - unlikely.  The elevated wbc on admit was ilkely due to steroid use. P:   - Cultures pending   ENDOCRINE A:    DM-II: A1c 6.7.   P:  - SSI - start home dose of glyburide  NEUROLOGIC A:    Acute Encephalopathy: significantly improved P:   - Monitor neurological exams   I have personally obtained a history, examined the patient, evaluated laboratory and imaging results, formulated the assessment and plan and placed orders.  I discussed issue of re-intubation with her. She is deciding on DNI vs short term intubation only.  She is followed by Dr Gwenette Greet in office. His input on this matter would be valued    Merton Border, MD ; [facility] [phone].  After 5:30  PM or weekends, call [phone]   Seen with: Ivor Costa 02/26/2014, 6:56 AM

## 2014-02-26 NOTE — Clinical Social Work Note (Signed)
Clinical Social Work Department BRIEF PSYCHOSOCIAL ASSESSMENT 02/26/2014  Patient:  Paige Hardin, Paige Hardin     Account Number:  [id number]     Admit date:  02/24/2014  Clinical Social Worker:  Wylene Men  Date/Time:  02/26/2014 10:58 AM  Referred by:  Physician  Date Referred:  02/26/2014 Referred for  Psychosocial assessment  SNF Placement   Other Referral:   none   Interview type:  Patient Other interview type:   none    PSYCHOSOCIAL DATA Living Status:  ALONE Admitted from facility:  [place] Level of care:  [place] Primary support name:  Charlotte Crumb Primary support relationship to patient:  SPOUSE Degree of support available:   adequate    CURRENT CONCERNS Current Concerns  Post-Acute Placement   Other Concerns:   none    SOCIAL WORK ASSESSMENT / PLAN CSW assessed pt at bedside.  Pt was sitting up in the bed eating breakfast during assessment.  Breathing was labored while in the upright position.  Pt states that she is from home with spouse.  Pt reports being at [facility] for STR prior to admission and will be going back to [facility] after dc to complete STR.    CSW spoke with Narda Rutherford at [facility] who reports that family/pt has a "bed hold" while pt is in the hospital so bed availability is not a barrier to getting pt back to SNF upon dc.    Pt remains hopeful regarding her prognosis and recovery. Pt is anxious to get home and back to a "normal" life.  CSW provided emotional support and encouragement to pt.  Pt views rehab as short term and is willing and compliant with recommendations.    Per MD possible dc Friday or Saturday.  pt has a transfer order to a tele-bed once one becomes available.  [name] at [facility], SNF approves plans for either day to be re-admitted to facility without barriers.  CSW updated FL2 and sent updated clinicals/FL2 to SNF.   Assessment/plan status:  Psychosocial Support/Ongoing Assessment of  Needs Other assessment/ plan:   FL2  PASARR existing   Information/referral to community resources:   SNF    PATIENT'S/FAMILY'S RESPONSE TO PLAN OF CARE: Pt was appreciative of CSW assistance.  Pt is compliant and agreeable to recommendation of continuing STR prior to going home.       Nonnie Done, [place] [phone]  Clinical Social Work

## 2014-02-26 NOTE — Progress Notes (Signed)
Attempted to transfer pt to telemetry. Pt stated that she could not breathe. Sats were approx 90 HR approx 100. Initial assessment lungs sounded clear. Pt appeared to be really anxious. RT paged for a PRN breathing tx. Lungs then began to sound wheezy, MD in the box was notified. Lasix and Stat CXR was ordered. Pt was able to transition back to Nasal Canula. Will continue to monitor. Transfer order was cancelled.

## 2014-02-27 ENCOUNTER — Inpatient Hospital Stay (HOSPITAL_COMMUNITY): Payer: Medicare Other

## 2014-02-27 DIAGNOSIS — M79609 Pain in unspecified limb: Secondary | ICD-10-CM

## 2014-02-27 LAB — CBC
HEMATOCRIT: 24.2 % — AB (ref 36.0–46.0)
Hemoglobin: 7.4 g/dL — ABNORMAL LOW (ref 12.0–15.0)
MCH: 28.2 pg (ref 26.0–34.0)
MCHC: 30.6 g/dL (ref 30.0–36.0)
MCV: 92.4 fL (ref 78.0–100.0)
Platelets: 285 10*3/uL (ref 150–400)
RBC: 2.62 MIL/uL — ABNORMAL LOW (ref 3.87–5.11)
RDW: 17.7 % — AB (ref 11.5–15.5)
WBC: 18.3 10*3/uL — ABNORMAL HIGH (ref 4.0–10.5)

## 2014-02-27 LAB — BASIC METABOLIC PANEL
BUN: 28 mg/dL — ABNORMAL HIGH (ref 6–23)
BUN: 26 mg/dL — AB (ref 6–23)
CHLORIDE: 97 meq/L (ref 96–112)
CO2: 30 mEq/L (ref 19–32)
CO2: 28 meq/L (ref 19–32)
CREATININE: 0.97 mg/dL (ref 0.50–1.10)
CREATININE: 0.99 mg/dL (ref 0.50–1.10)
Calcium: 8.5 mg/dL (ref 8.4–10.5)
Calcium: 8.9 mg/dL (ref 8.4–10.5)
Chloride: 97 mEq/L (ref 96–112)
GFR calc Af Amer: 66 mL/min — ABNORMAL LOW (ref >90)
GFR calc non Af Amer: 59 mL/min — ABNORMAL LOW (ref >90)
GFR, EST AFRICAN AMERICAN: 68 mL/min — AB (ref >90)
GFR, EST NON AFRICAN AMERICAN: 57 mL/min — AB (ref >90)
Glucose, Bld: 192 mg/dL — ABNORMAL HIGH (ref 70–99)
Glucose, Bld: 307 mg/dL — ABNORMAL HIGH (ref 70–99)
POTASSIUM: 5.1 meq/L (ref 3.7–5.3)
Potassium: 4.3 mEq/L (ref 3.7–5.3)
Sodium: 138 mEq/L (ref 137–147)
Sodium: 140 mEq/L (ref 137–147)

## 2014-02-27 LAB — GLUCOSE, CAPILLARY
GLUCOSE-CAPILLARY: 191 mg/dL — AB (ref 70–99)
GLUCOSE-CAPILLARY: 205 mg/dL — AB (ref 70–99)
GLUCOSE-CAPILLARY: 278 mg/dL — AB (ref 70–99)
Glucose-Capillary: 173 mg/dL — ABNORMAL HIGH (ref 70–99)
Glucose-Capillary: 185 mg/dL — ABNORMAL HIGH (ref 70–99)
Glucose-Capillary: 248 mg/dL — ABNORMAL HIGH (ref 70–99)

## 2014-02-27 LAB — PROCALCITONIN: PROCALCITONIN: 0.79 ng/mL

## 2014-02-27 LAB — PRO B NATRIURETIC PEPTIDE: PRO B NATRI PEPTIDE: 3380 pg/mL — AB (ref 0–125)

## 2014-02-27 LAB — MAGNESIUM: MAGNESIUM: 2 mg/dL (ref 1.5–2.5)

## 2014-02-27 LAB — TROPONIN I: Troponin I: <0.3 ng/mL (ref <0.30)

## 2014-02-27 MED ORDER — PREDNISONE 20 MG PO TABS
40.0000 mg | ORAL_TABLET | Freq: Every day | ORAL | Status: AC
  Administered 2014-02-28: 40 mg via ORAL
  Filled 2014-02-27: qty 2

## 2014-02-27 MED ORDER — PREDNISONE 10 MG PO TABS
10.0000 mg | ORAL_TABLET | Freq: Every day | ORAL | Status: AC
  Administered 2014-03-03: 10 mg via ORAL
  Filled 2014-02-27 (×2): qty 1

## 2014-02-27 MED ORDER — DOCUSATE SODIUM 100 MG PO CAPS
100.0000 mg | ORAL_CAPSULE | Freq: Two times a day (BID) | ORAL | Status: DC
  Administered 2014-02-27 – 2014-03-12 (×24): 100 mg via ORAL
  Filled 2014-02-27 (×26): qty 1

## 2014-02-27 MED ORDER — FUROSEMIDE 10 MG/ML IJ SOLN: 40.0000 mg | Freq: Once | INTRAMUSCULAR | Status: DC

## 2014-02-27 MED ORDER — DILTIAZEM HCL 60 MG PO TABS
60.0000 mg | ORAL_TABLET | Freq: Four times a day (QID) | ORAL | Status: DC
  Administered 2014-02-27 – 2014-03-07 (×30): 60 mg via ORAL
  Filled 2014-02-27 (×37): qty 1

## 2014-02-27 MED ORDER — PREDNISONE 50 MG PO TABS
50.0000 mg | ORAL_TABLET | Freq: Every day | ORAL | Status: AC
  Administered 2014-02-27: 50 mg via ORAL
  Filled 2014-02-27: qty 1

## 2014-02-27 MED ORDER — IPRATROPIUM-ALBUTEROL 0.5-2.5 (3) MG/3ML IN SOLN
3.0000 mL | RESPIRATORY_TRACT | Status: DC
  Administered 2014-02-27 – 2014-02-28 (×7): 3 mL via RESPIRATORY_TRACT
  Filled 2014-02-27 (×7): qty 3

## 2014-02-27 MED ORDER — DILTIAZEM HCL 25 MG/5ML IV SOLN: 10.0000 mg | Freq: Once | INTRAVENOUS | Status: DC

## 2014-02-27 MED ORDER — FUROSEMIDE 10 MG/ML IJ SOLN
40.0000 mg | Freq: Once | INTRAMUSCULAR | Status: AC
  Administered 2014-02-27: 40 mg via INTRAVENOUS
  Filled 2014-02-27: qty 4

## 2014-02-27 MED ORDER — MORPHINE SULFATE 2 MG/ML IJ SOLN
2.0000 mg | Freq: Once | INTRAMUSCULAR | Status: AC
  Administered 2014-02-27: 2 mg via INTRAVENOUS
  Filled 2014-02-27: qty 1

## 2014-02-27 MED ORDER — DILTIAZEM HCL 100 MG IV SOLR
5.0000 mg/h | INTRAVENOUS | Status: DC
  Administered 2014-02-27: 5 mg/h via INTRAVENOUS
  Administered 2014-02-27: 15 mg/h via INTRAVENOUS
  Administered 2014-02-27: 10 mg/h via INTRAVENOUS
  Administered 2014-02-28 (×3): 15 mg/h via INTRAVENOUS
  Administered 2014-03-01 – 2014-03-06 (×6): 5 mg/h via INTRAVENOUS
  Filled 2014-02-27 (×8): qty 100

## 2014-02-27 MED ORDER — FUROSEMIDE 10 MG/ML IJ SOLN
40.0000 mg | Freq: Two times a day (BID) | INTRAMUSCULAR | Status: DC
Start: 2014-02-27 — End: 2014-03-05
  Administered 2014-02-27 – 2014-03-05 (×12): 40 mg via INTRAVENOUS
  Filled 2014-02-27 (×16): qty 4

## 2014-02-27 MED ORDER — DILTIAZEM LOAD VIA INFUSION
10.0000 mg | Freq: Once | INTRAVENOUS | Status: AC
  Administered 2014-02-27: 10 mg via INTRAVENOUS
  Filled 2014-02-27: qty 10

## 2014-02-27 MED ORDER — IPRATROPIUM BROMIDE 0.02 % IN SOLN
0.5000 mg | Freq: Four times a day (QID) | RESPIRATORY_TRACT | Status: DC | PRN
  Administered 2014-02-27: 0.5 mg via RESPIRATORY_TRACT
  Filled 2014-02-27: qty 2.5

## 2014-02-27 MED ORDER — PREDNISONE 20 MG PO TABS
30.0000 mg | ORAL_TABLET | Freq: Every day | ORAL | Status: AC
  Administered 2014-03-01: 30 mg via ORAL
  Filled 2014-02-27: qty 1

## 2014-02-27 MED ORDER — MORPHINE SULFATE 2 MG/ML IJ SOLN
2.0000 mg | INTRAMUSCULAR | Status: DC | PRN
  Administered 2014-02-27 (×2): 2 mg via INTRAVENOUS
  Filled 2014-02-27 (×2): qty 1

## 2014-02-27 MED ORDER — BIOTENE DRY MOUTH MT LIQD
15.0000 mL | Freq: Two times a day (BID) | OROMUCOSAL | Status: DC
  Administered 2014-02-27 – 2014-03-05 (×13): 15 mL via OROMUCOSAL

## 2014-02-27 MED ORDER — BUDESONIDE 0.5 MG/2ML IN SUSP
0.5000 mg | Freq: Two times a day (BID) | RESPIRATORY_TRACT | Status: DC
  Administered 2014-02-27 – 2014-03-02 (×6): 0.5 mg via RESPIRATORY_TRACT
  Filled 2014-02-27 (×8): qty 2

## 2014-02-27 MED ORDER — PREDNISONE 20 MG PO TABS
20.0000 mg | ORAL_TABLET | Freq: Every day | ORAL | Status: AC
  Administered 2014-03-02: 20 mg via ORAL
  Filled 2014-02-27: qty 1

## 2014-02-27 NOTE — Progress Notes (Signed)
Right lower extremity venous duplex completed.  Right:  No evidence of DVT, superficial thrombosis, or Baker's cyst.  Left:  Negative for DVT in the common femoral vein.  

## 2014-02-27 NOTE — Progress Notes (Signed)
PT Cancellation Note  Patient Details Name: Paige Hardin MRN: [id number] DOB: 1946/09/28   Cancelled Treatment:    Reason Eval Not Completed: Medical issues which prohibited therapy--HR 123-136, SaO2 89-90% at rest.    Audriana Aldama 02/27/2014, 9:51 AM Pager [phone]

## 2014-02-27 NOTE — Progress Notes (Signed)
Inpatient Diabetes Program Recommendations  AACE/ADA: New Consensus Statement on Inpatient Glycemic Control (2013)  Target Ranges:  Prepandial:   less than 140 mg/dL      Peak postprandial:   less than 180 mg/dL (1-2 hours)      Critically ill patients:  140 - 180 mg/dL      Results for [name], [name] (MRN [id number]) as of 02/27/2014 08:05  Ref. Range 02/25/2014 23:38 02/26/2014 04:10 02/26/2014 07:37 02/26/2014 12:23 02/26/2014 15:43 02/26/2014 19:04  Glucose-Capillary Latest Range: 70-99 mg/dL 182 (H) 216 (H) 216 (H) 215 (H) 219 (H) 172 (H)    Results for [name], [name] (MRN [id number]) as of 02/27/2014 08:05  Ref. Range 02/27/2014 00:18 02/27/2014 03:46  Glucose-Capillary Latest Range: 70-99 mg/dL 191 (H) 173 (H)     **Patient extubated 04/01.  Now on CL diet.  A1c shows decent control at SNF (6.7%).  **Patient currently getting home dose Glyburide + Novolog Sensitive SSI Q4 hours  **Patient received 27 units total Novolog SSI yesterday throughout the day   MD- Please consider the following:  1. Change/Increase Novolog SSI to Moderate scale tid ac + HS (currently ordered as Sensitive Q4) 2. Consider adding basal insulin- Lantus 15 units QHS   Will follow. Wyn Quaker RN, MSN, CDE Diabetes Coordinator Inpatient Diabetes Program Team Pager: [phone] (8a-10p)

## 2014-02-27 NOTE — Progress Notes (Signed)
Name: Paige Hardin MRN: [id number] DOB: 03/23/1946 LOS: 3 BURNETT,BRENT A, MD - pcp   PCCM PROGRESS NOTE  Brief admission history: The patient is 68 yo lady with PMH of COPD (dr clance) and dCHF with EF of 60% (2D echo on 01/15/14), who was admitted due to combiced COPD and CHF exacerbation on 02/24/14.  She was intubated and reated with steroids, abx, diureses. She was extubated on 4/1.    has a past medical history of Chronic low back pain; Emphysema; Paroxysmal atrial fibrillation; COPD (chronic obstructive pulmonary disease); Hyperlipidemia; Diastolic congestive heart failure; Obese; Carotid atherosclerosis; Lung nodules; Hoarseness, chronic; Pneumonia (1990's); Type 2 diabetes mellitus; GERD (gastroesophageal reflux disease); Arthritis; and Depression.   has past surgical history that includes Esophagogastroduodenoscopy (01/26/2012); Colonoscopy (01/26/2012); Breast biopsy (Left, ~ 1980); Breast lumpectomy (Left, ~ 1980); and Total abdominal hysterectomy (1994).   reports that she quit smoking about 5 years ago. Her smoking use included Cigarettes. She has a 135 pack-year smoking history. She has never used smokeless tobacco.   SIGNIFICANT EVENTS / STUDIES:   3/31 - worsening SOB at SNF despite multiple albuterol Rx, intubated in ER.  CXR c/w edema on admit 4/1- XCR: No pneumothorax. CHF with mild improvement since yesterday.  4/1- Extubated 4/2- Had worsening SOB and [employer]-RVR, responded to extra dose of Lasix and IV Cardizem 4/2-CXR worsening pulmonary edema 4/3- CXR improving pulmonary edema  LINES / TUBES: OETT 3/31>>>4/1  CULTURES: UA 3/31>> negative for UTI UC 3/31>>negative  U. Strep Antigen 3/31>>negative BCx2 3/31>> ....Marland KitchenMarland KitchenMarland Kitchen resp virus panel 02/27/14>>  ANTIBIOTICS: Vanco 3/31 (empiric)>>4/1 Zosyn 3/31 (empiric)>>4/1  Vital Signs: Filed Vitals:   02/27/14 0600  BP: 122/66  Pulse: 136  Temp:   Resp: 17   I/O last 3 completed shifts: In: 594.7 [I.V.:564.7;  NG/GT:30] Out: 4401 [Urine:1715]  negative 1511 ml since admission  Physical Examination:  General: No Acute disdress HEENT: PERRL, EOMI, no scleral icterus Cardiac: S1/S2, Irregular, No Murmurs, No gallops or rubs Pulm: scattered rhonchi bilaterally Abd: Soft,  nondistended, nontender, no rebound pain, no organomegaly, BS present Ext: 1+ pitting leg edema bilaterally. Has right hip and upper leg pain posteriorly Neuro: alert and oriented X3, moves all extremeties, sensation to light touch intact.  Skin: No rashes  PULMONARY  Recent Labs Lab 02/24/14 2312 02/25/14 0040 02/25/14 0430  PHART 7.220* 7.386 7.517*  PCO2ART 78.6* 43.3 31.7*  PO2ART 79.0* 91.3 72.7*  HCO3 32.5* 25.8* 25.7*  TCO2 35 27.2 26.7  O2SAT 93.0 97.5 96.6    CBC  Recent Labs Lab 02/25/14 0205 02/26/14 0154 02/27/14 0045  HGB 7.6* 7.3* 7.4*  HCT 24.5* 23.7* 24.2*  WBC 23.7* 23.9* 18.3*  PLT 298 273 285    COAGULATION  Recent Labs Lab 02/24/14 2220  INR 1.27    CARDIAC   Recent Labs Lab 02/24/14 2247 02/25/14 0205 02/25/14 1000 02/26/14 0800  TROPONINI <0.30 <0.30 0.75* 0.39*    Recent Labs Lab 02/24/14 2220 02/27/14 0540  PROBNP 1615.0* 3380.0*     CHEMISTRY  Recent Labs Lab 02/24/14 2220 02/25/14 0205 02/26/14 0154 02/27/14 0045  NA 134* 130* 138 138  K 4.9 4.5 4.4 4.3  CL 90* 86* 95* 97  CO2 29 22 29 30   GLUCOSE 466* 628* 213* 192*  BUN 20 24* 29* 28*  CREATININE 1.06 1.23* 1.11* 0.99  CALCIUM 9.0 8.7 9.1 8.5  MG  --   --   --  2.0   Estimated Creatinine Clearance: 76.7 ml/min (by C-G formula  based on Cr of 0.99).   LIVER  Recent Labs Lab 02/24/14 2220  AST 20  ALT 16  ALKPHOS 96  BILITOT 0.7  PROT 7.3  ALBUMIN 3.3*  INR 1.27     INFECTIOUS  Recent Labs Lab 02/24/14 2236  LATICACIDVEN 0.90     ENDOCRINE CBG (last 3)   Recent Labs  02/27/14 0018 02/27/14 0346 02/27/14 0757  GLUCAP 191* 173* 185*    IMAGING x48h  Dg Chest  Port 1 View  02/27/2014   CLINICAL DATA:  Evaluate CHF.  EXAM: PORTABLE CHEST - 1 VIEW  COMPARISON:  DG CHEST 1V PORT dated 02/26/2014  FINDINGS: The cardiac silhouette remains moderately enlarged, mildly calcified aortic knob. Slightly decreased interstitial prominence and central pulmonary vasculature congestion. No pleural effusions or focal consolidations. No pneumothorax.  Multiple EKG lines overlie the patient and may obscure subtle underlying pathology. Soft tissue planes and included osseous structures are nonsuspicious, mild degenerative change of thoracic spine.  IMPRESSION: Stable cardiomegaly with decreased interstitial pulmonary edema. No definite pleural effusions on today's examination.   Electronically Signed   By: Elon Alas   On: 02/27/2014 05:52   Dg Chest Port 1 View  02/26/2014   CLINICAL DATA:  COPD, emphysema, shortness of Breath  EXAM: PORTABLE CHEST - 1 VIEW  COMPARISON:  02/25/2014  FINDINGS: Cardiomegaly is noted. Endotracheal and NG tube has been removed. There is mild interstitial prominence bilaterally with slight worsening in aeration. Findings highly suspicious for worsening congestive heart failure. Question small bilateral pleural effusion.  IMPRESSION: Endotracheal and NG tube has been removed. There is mild interstitial prominence bilaterally with slight worsening in aeration. Findings highly suspicious for worsening congestive heart failure. Question small bilateral pleural effusion.   Electronically Signed   By: Lahoma Crocker M.D.   On: 02/26/2014 18:53    Subjective: The patient feels better than yestoday. Still has SOB. No chest pain, fever or chills. Has right upper leg and hip pain.   PULMONARY A: Acute Hypercarbic Respiratory Failure: resolved Pulmonary Edema: improving COPD: Blood and respiratory cultures: no growth so far, pending the final results  P:   - IV Lasix 40 mg bid - Scheduled BD's: change to duoneb q4h + pulmicort neb bid - start prednisone  taper x 6 days   CARDIOVASCULAR A:   CHF exacerbation (ECHO 12/2103 with EF 60% and mild LVH): Pro BNP 1615-->3380. CHF seems to be worsening.  Afib with RVR: HR better controlled after started IV Cardizem gtt Elevated Trop; trending down (0.75-->0.39), no chest pain. EKG no ischemia change   - HR 122 on IV cardizem gtt (staff MD)  P:  - Diuresis as above - Continue Pradaxa - Continue IV Cardizem gtt,  - start oral Cardizm and titrate down Cardizem gtt -hold off  Repeat 2 D ehco (staff MD)  -Scheduled lasix (staff MD)  RENAL A:    Cre, Mg and K normal P:   - Trend BMP - Address electrolytes as indicated.  GASTROINTESTINAL A:    GERD P:    -  Protonix 40 mg daily (was on lansoprazole 30 MG daily at home)  -  Carb modified diet  HEMATOLOGIC A:    Leukocytosis - likely in setting of steroid administration.  Trending down.  P:  - Monitor CBC trend  INFECTIOUS A:    R/O HCAP - unlikely.  The elevated wbc on admit was ilkely due to steroid use. P:   - Cultures pending  - check resp  virus panel - check PCT and decide on abx for AECOPD  ENDOCRINE A:    DM-II: A1c 6.7.  CBG 185 in AM P:  - SSI - Home dose of glyburide  NEUROLOGIC A:    Acute Encephalopathy: significantly improved P:   - Monitor neurological exams   Ivor Costa, MD PGY3, Internal Medicine Teaching Service Pager: [phone]  STaff MD 02/26/14: I discussed issue of re-intubation with her. She is deciding on DNI vs short term intubation only.  She is followed by Dr Gwenette Greet in office. His input on this matter would be valued   STAFF NOTE: I, Dr Ann Lions have personally reviewed patient's available data, including medical history, events of note, physical examination and test results as part of my evaluation. I have discussed with resident/NP and other care providers such as pharmacist, RN and RRT.  In addition,  I personally evaluated patient and elicited key findings of acute on chronic respiratory  failure due to [facility] (diast) and aecopd. Will restart steroid due to dyspnea and tight exam of chest. Continue lasix. Add po cardizem. Keep in ICU one more day. Family updated at bedside.  Rest per NP/medical resident whose note is outlined above and that I agree with  The patient is critically ill with multiple organ systems failure and requires high complexity decision making for assessment and support, frequent evaluation and titration of therapies, application of advanced monitoring technologies and extensive interpretation of multiple databases.   Critical Care Time devoted to patient care services described in this note is  35  Minutes.  Dr. Brand Males, M.D., [facility].C.P Pulmonary and Critical Care Medicine Staff Physician [facility] Pager: [phone], If no answer or between  15:00h - 7:00h: call [phone]  02/27/2014 11:51 AM

## 2014-02-27 NOTE — Progress Notes (Signed)
MICU NF Progress Note  S:   Patient reports chest tightness, dyspnea and right thigh aching pain 3/10. Atrial fibrillation with RVR of HR 120s-140s noted.  Patient with history of AF (on Cardizem and Pradaxa) but had been in NSR this admission until this episode.  O: HR 120-140s, RR 30, BP 108/63mHg, O2Sat 85% on 4L General:  Awake, anxious Cardiac:  Irregularly irregular rhythm, no m/g/r Lungs:  Few rhonchi, no rales or wheezing Abdomen:  +BS, soft, NT, ND LE:  1+ lower extremity edema B/L Neuro:  AAO x 3  A/P Afib with RVR and hypoxia - desat to mid-80s - likely 2/2 to ACHF and COPD.  Doubt PE as patient is on Pradaxa.  Troponin elevation (0.75 --> 0.39) thought to be 2/2 to demand.  BMP reviewed - no electrolyte abnormalities.  - Goal SpO2 88-92% - Repeat EKG - no ischemic changes - troponin q6h x 3, ProBNP - stat PCXR--pulmonary vascular congestion - cardizem gtt initiated - Lasix 40mg  IV - morphine 2 mg IV x 1 for pain and anxiety - Neb now   Duwaine Maxin, DO-PGY1 Internal Medicine Teaching Service MICU - NF intern on call

## 2014-02-27 NOTE — Evaluation (Signed)
Physical Therapy Evaluation Patient Details Name: Paige Hardin MRN: [id number] DOB: 07-02-1946 Today's Date: 02/27/2014   History of Present Illness  The patient is 68 yo lady with PMH of COPD and dCHF with EF of 60% (2D echo on 01/15/14), who was admitted due to combiced COPD and CHF exacerbation on 02/24/14. Pt intubated 3/31 and extubated 4/1  Clinical Impression  Pt admitted with COPD/CHF exacerbation. Pt currently severely limited in her activity due to respiratory and cardiac status. Pt currently with functional limitations due to the deficits listed below (see PT Problem List).  Pt will benefit from skilled PT to increase their independence and safety with mobility to allow discharge to the venue listed below.       Follow Up Recommendations SNF    Equipment Recommendations  None recommended by PT    Recommendations for Other Services       Precautions / Restrictions Precautions Precautions: Other (comment) (desaturates with activity) Restrictions Weight Bearing Restrictions: No      Mobility  Bed Mobility Overal bed mobility: Needs Assistance;+2 for physical assistance             General bed mobility comments: Pt unable to assist with UEs to move to [facility] due to dyspnea and decr SaO2 with minimal activity; did assist with LEs minimally  Transfers                    Ambulation/Gait                Stairs            Wheelchair Mobility    Modified Rankin (Stroke Patients Only)       Balance                                             Pertinent Vitals/Pain SaO2 93% at rest (4L  O2); decr to 87% with ROM/repositioning in bed; varied 87-88% for next 5 minutes  HR 102 at rest; with ROM/repositioning incr to 136; down to 117 after 5 minutes    Home Living Family/patient expects to be discharged to:: Skilled nursing facility                 Additional Comments: lives alone    Prior Function Level of  Independence: Independent with assistive device(s)         Comments: Pt limited short distance gait with exertion that would require seated rest breaks.      Hand Dominance   Dominant Hand: Right    Extremity/Trunk Assessment   Upper Extremity Assessment: Generalized weakness           Lower Extremity Assessment: Generalized weakness         Communication   Communication: No difficulties  Cognition Arousal/Alertness: Awake/alert Behavior During Therapy: WFL for tasks assessed/performed Overall Cognitive Status: Within Functional Limits for tasks assessed                      General Comments General comments (skin integrity, edema, etc.): Performed AROM-AAROM x 1-2 reps for UE and LE's with pt's HR increasing and sats decr. Further mobility deferred. Pt left in chair position with SaO2 87%, HR 122    Exercises        Assessment/Plan    PT Assessment Patient needs continued PT services  PT Diagnosis Generalized  weakness   PT Problem List Decreased strength;Decreased activity tolerance;Decreased mobility;Decreased knowledge of use of DME;Cardiopulmonary status limiting activity  PT Treatment Interventions DME instruction;Gait training;Functional mobility training;Therapeutic activities;Therapeutic exercise;Patient/family education   PT Goals (Current goals can be found in the Care Plan section) Acute Rehab PT Goals Patient Stated Goal: to get stronger and go to "rest home" PT Goal Formulation: With patient Time For Goal Achievement: 03/13/14 Potential to Achieve Goals: Good    Frequency Min 2X/week   Barriers to discharge Decreased caregiver support      Co-evaluation               End of Session Equipment Utilized During Treatment: Oxygen Activity Tolerance: Treatment limited secondary to medical complications (Comment) Patient left: in bed;with call bell/phone within reach Nurse Communication: Mobility status;Other (comment) (incr HR,  decr SaO2 with activity)         Time: 6659-9357 PT Time Calculation (min): 14 min   Charges:   PT Evaluation $Initial PT Evaluation Tier I: 1 Procedure PT Treatments $Therapeutic Exercise: 8-22 mins   PT G Codes:          Emmelia Holdsworth 03-04-2014, 3:29 PM Pager [phone]

## 2014-02-28 ENCOUNTER — Inpatient Hospital Stay (HOSPITAL_COMMUNITY): Payer: Medicare Other

## 2014-02-28 LAB — CULTURE, RESPIRATORY

## 2014-02-28 LAB — GLUCOSE, CAPILLARY
GLUCOSE-CAPILLARY: 190 mg/dL — AB (ref 70–99)
GLUCOSE-CAPILLARY: 239 mg/dL — AB (ref 70–99)
GLUCOSE-CAPILLARY: 285 mg/dL — AB (ref 70–99)
GLUCOSE-CAPILLARY: 343 mg/dL — AB (ref 70–99)
Glucose-Capillary: 242 mg/dL — ABNORMAL HIGH (ref 70–99)
Glucose-Capillary: 390 mg/dL — ABNORMAL HIGH (ref 70–99)
Glucose-Capillary: 355 mg/dL — ABNORMAL HIGH (ref 70–99)

## 2014-02-28 LAB — BASIC METABOLIC PANEL
BUN: 25 mg/dL — ABNORMAL HIGH (ref 6–23)
CALCIUM: 8.6 mg/dL (ref 8.4–10.5)
CO2: 27 mEq/L (ref 19–32)
Chloride: 95 mEq/L — ABNORMAL LOW (ref 96–112)
Creatinine, Ser: 0.89 mg/dL (ref 0.50–1.10)
GFR calc non Af Amer: 65 mL/min — ABNORMAL LOW (ref >90)
GFR, EST AFRICAN AMERICAN: 75 mL/min — AB (ref >90)
Glucose, Bld: 260 mg/dL — ABNORMAL HIGH (ref 70–99)
Potassium: 4.5 mEq/L (ref 3.7–5.3)
SODIUM: 138 meq/L (ref 137–147)

## 2014-02-28 LAB — CBC
HCT: 24.9 % — ABNORMAL LOW (ref 36.0–46.0)
Hemoglobin: 7.6 g/dL — ABNORMAL LOW (ref 12.0–15.0)
MCH: 27.6 pg (ref 26.0–34.0)
MCHC: 30.5 g/dL (ref 30.0–36.0)
MCV: 90.5 fL (ref 78.0–100.0)
PLATELETS: 254 10*3/uL (ref 150–400)
RBC: 2.75 MIL/uL — ABNORMAL LOW (ref 3.87–5.11)
RDW: 17.3 % — AB (ref 11.5–15.5)
WBC: 12 10*3/uL — AB (ref 4.0–10.5)

## 2014-02-28 LAB — CULTURE, RESPIRATORY W GRAM STAIN

## 2014-02-28 LAB — MAGNESIUM: MAGNESIUM: 2 mg/dL (ref 1.5–2.5)

## 2014-02-28 MED ORDER — LEVALBUTEROL HCL 0.63 MG/3ML IN NEBU
0.6300 mg | INHALATION_SOLUTION | RESPIRATORY_TRACT | Status: DC
  Administered 2014-02-28 – 2014-03-02 (×10): 0.63 mg via RESPIRATORY_TRACT
  Filled 2014-02-28 (×20): qty 3

## 2014-02-28 MED ORDER — METOPROLOL TARTRATE 12.5 MG HALF TABLET
12.5000 mg | ORAL_TABLET | Freq: Two times a day (BID) | ORAL | Status: DC
  Administered 2014-02-28 – 2014-03-03 (×6): 12.5 mg via ORAL
  Filled 2014-02-28 (×7): qty 1

## 2014-02-28 MED ORDER — LEVOFLOXACIN 500 MG PO TABS
500.0000 mg | ORAL_TABLET | Freq: Every day | ORAL | Status: AC
  Administered 2014-02-28 – 2014-03-03 (×4): 500 mg via ORAL
  Filled 2014-02-28 (×5): qty 1

## 2014-02-28 MED ORDER — INSULIN GLARGINE 100 UNIT/ML ~~LOC~~ SOLN
5.0000 [IU] | Freq: Every day | SUBCUTANEOUS | Status: DC
  Administered 2014-02-28 – 2014-03-08 (×9): 5 [IU] via SUBCUTANEOUS
  Filled 2014-02-28 (×11): qty 0.05

## 2014-02-28 NOTE — Progress Notes (Signed)
Name: Paige Hardin MRN: [id number] DOB: 12/16/1945 LOS: 4 BURNETT,BRENT A, MD - pcp   PCCM PROGRESS NOTE  Brief admission history: The patient is 68 yo lady with PMH of COPD (dr clance) and dCHF with EF of 60% (2D echo on 01/15/14), who was admitted due to combiced COPD and CHF exacerbation on 02/24/14.  She was intubated and reated with steroids, abx, diureses. She was extubated on 4/1.    has a past medical history of Chronic low back pain; Emphysema; Paroxysmal atrial fibrillation; COPD (chronic obstructive pulmonary disease); Hyperlipidemia; Diastolic congestive heart failure; Obese; Carotid atherosclerosis; Lung nodules; Hoarseness, chronic; Pneumonia (1990's); Type 2 diabetes mellitus; GERD (gastroesophageal reflux disease); Arthritis; and Depression.   has past surgical history that includes Esophagogastroduodenoscopy (01/26/2012); Colonoscopy (01/26/2012); Breast biopsy (Left, ~ 1980); Breast lumpectomy (Left, ~ 1980); and Total abdominal hysterectomy (1994).   reports that she quit smoking about 5 years ago. Her smoking use included Cigarettes. She has a 135 pack-year smoking history. She has never used smokeless tobacco.  LINES / TUBES: OETT 3/31>>>4/1  CULTURES: UA 3/31>> negative for UTI UC 3/31>>negative  U. Strep Antigen 3/31>>negative BCx2 3/31>> ....Marland KitchenMarland KitchenMarland Kitchen resp virus panel 02/27/14>>  ANTIBIOTICS: Vanco 3/31 (empiric)>>4/1 Zosyn 3/31 (empiric)>>4/1 Levaquin 4/4 >. (4 days)    SIGNIFICANT EVENTS / STUDIES:   3/31 - worsening SOB at SNF despite multiple albuterol Rx, intubated in ER.  CXR c/w edema on admit 4/1- XCR: No pneumothorax. CHF with mild improvement since yesterday.  4/1- Extubated 4/2- Had worsening SOB and [employer]-RVR, responded to extra dose of Lasix and IV Cardizem 4/2-CXR worsening pulmonary edema 4/3- CXR improving pulmonary edema     SUBJECTIVE/OVERNIGHT/INTERVAL HX 02/28/14: did not need intubaion. Watching TV. Says occ. Palpitations.  Duplex Bilateral -  NO DVT, PT recommending SNF. Wheeze improved afte restart steoids. Sugars high;no lantus. Not using bipap  Still on cardizem gtt due to A Fib with HR 120  Vital Signs: Filed Vitals:   02/28/14 1202  BP:   Pulse:   Temp: 98 F (36.7 C)  Resp:    I/O last 3 completed shifts: In: 1694.7 [P.O.:920; I.V.:774.7] Out: 2675 [Urine:2675]  negative 1511 ml since admission  Physical Examination:  General: No Acute disdress HEENT: PERRL, EOMI, no scleral icterus Cardiac: S1/S2, Irregular, No Murmurs, No gallops or rubs Pulm:CTA bilaterally. Some hoarse Abd: Soft,  nondistended, nontender, no rebound pain, no organomegaly, BS present Ext: 1+ pitting leg edema bilaterally. Has right hip and upper leg pain posteriorly Neuro: alert and oriented X3, moves all extremeties, sensation to light touch intact.  Skin: No rashes  PULMONARY  Recent Labs Lab 02/24/14 2312 02/25/14 0040 02/25/14 0430  PHART 7.220* 7.386 7.517*  PCO2ART 78.6* 43.3 31.7*  PO2ART 79.0* 91.3 72.7*  HCO3 32.5* 25.8* 25.7*  TCO2 35 27.2 26.7  O2SAT 93.0 97.5 96.6    CBC  Recent Labs Lab 02/26/14 0154 02/27/14 0045 02/28/14 0310  HGB 7.3* 7.4* 7.6*  HCT 23.7* 24.2* 24.9*  WBC 23.9* 18.3* 12.0*  PLT 273 285 254    COAGULATION  Recent Labs Lab 02/24/14 2220  INR 1.27    CARDIAC    Recent Labs Lab 02/24/14 2247 02/25/14 0205 02/25/14 1000 02/26/14 0800 02/27/14 1220  TROPONINI <0.30 <0.30 0.75* 0.39* <0.30    Recent Labs Lab 02/24/14 2220 02/27/14 0540  PROBNP 1615.0* 3380.0*     CHEMISTRY  Recent Labs Lab 02/25/14 0205 02/26/14 0154 02/27/14 0045 02/27/14 2055 02/28/14 0310  NA 130* 138 138 140 138  K 4.5 4.4 4.3 5.1 4.5  CL 86* 95* 97 97 95*  CO2 22 29 30 28 27   GLUCOSE 628* 213* 192* 307* 260*  BUN 24* 29* 28* 26* 25*  CREATININE 1.23* 1.11* 0.99 0.97 0.89  CALCIUM 8.7 9.1 8.5 8.9 8.6  MG  --   --  2.0  --  2.0   Estimated Creatinine Clearance: 84.6 ml/min (by  C-G formula based on Cr of 0.89).   LIVER  Recent Labs Lab 02/24/14 2220  AST 20  ALT 16  ALKPHOS 96  BILITOT 0.7  PROT 7.3  ALBUMIN 3.3*  INR 1.27     INFECTIOUS  Recent Labs Lab 02/24/14 2236 02/27/14 2055  LATICACIDVEN 0.90  --   PROCALCITON  --  0.79     ENDOCRINE CBG (last 3)   Recent Labs  02/28/14 0406 02/28/14 0713 02/28/14 1133  GLUCAP 242* 190* 239*    IMAGING x48h  Dg Chest Port 1 View  02/28/2014   CLINICAL DATA:  Respiratory failure.  EXAM: PORTABLE CHEST - 1 VIEW  COMPARISON:  DG CHEST 1V PORT dated 02/27/2014; DG CHEST 1V PORT dated 02/26/2014; DG CHEST 1V PORT dated 01/14/2014  FINDINGS: 0547 hr. There is stable cardiomegaly and aortic atherosclerosis. Interstitial edema has improved over the last 2 days. There is no confluent airspace opacity, significant pleural effusion or pneumothorax. The osseous structures appear unchanged for  IMPRESSION: Improving pulmonary edema with persistent cardiomegaly and residual vascular congestion.   Electronically Signed   By: Camie Patience M.D.   On: 02/28/2014 07:50   Dg Chest Port 1 View  02/27/2014   CLINICAL DATA:  Evaluate CHF.  EXAM: PORTABLE CHEST - 1 VIEW  COMPARISON:  DG CHEST 1V PORT dated 02/26/2014  FINDINGS: The cardiac silhouette remains moderately enlarged, mildly calcified aortic knob. Slightly decreased interstitial prominence and central pulmonary vasculature congestion. No pleural effusions or focal consolidations. No pneumothorax.  Multiple EKG lines overlie the patient and may obscure subtle underlying pathology. Soft tissue planes and included osseous structures are nonsuspicious, mild degenerative change of thoracic spine.  IMPRESSION: Stable cardiomegaly with decreased interstitial pulmonary edema. No definite pleural effusions on today's examination.   Electronically Signed   By: Elon Alas   On: 02/27/2014 05:52   Dg Chest Port 1 View  02/26/2014   CLINICAL DATA:  COPD, emphysema, shortness of  Breath  EXAM: PORTABLE CHEST - 1 VIEW  COMPARISON:  02/25/2014  FINDINGS: Cardiomegaly is noted. Endotracheal and NG tube has been removed. There is mild interstitial prominence bilaterally with slight worsening in aeration. Findings highly suspicious for worsening congestive heart failure. Question small bilateral pleural effusion.  IMPRESSION: Endotracheal and NG tube has been removed. There is mild interstitial prominence bilaterally with slight worsening in aeration. Findings highly suspicious for worsening congestive heart failure. Question small bilateral pleural effusion.   Electronically Signed   By: Lahoma Crocker M.D.   On: 02/26/2014 18:53   Dg Hip Portable 1 View Right  02/27/2014   CLINICAL DATA:  Right hip pain  EXAM: PORTABLE RIGHT HIP - 1 VIEW  COMPARISON:  None.  FINDINGS: Single frontal view the right hip was obtained and reveals no acute fracture dislocation. No gross soft tissue abnormality is noted.  IMPRESSION: No acute abnormality seen.   Electronically Signed   By: Inez Catalina M.D.   On: 02/27/2014 14:56    Subjective: The patient feels better than yestoday. Still has SOB. No chest pain, fever or chills. Has  right upper leg and hip pain.   PULMONARY A: Acute Hypercarbic Respiratory Failure: resolved Pulmonary Edema: improving COPD: Blood and respiratory cultures: no growth so far, pending the final results    - wheezing improved after restart prednisone 02/27/14  P:   - IV Lasix 40 mg bid - Scheduled BD's: change to xopenex q4h but continue atrovent q4h + pulmicort neb bid - prednisone taper x 5 more  days   CARDIOVASCULAR A:   CHF exacerbation (ECHO 12/2103 with EF 60% and mild LVH): Pro BNP 1615-->3380. CHF seems to be worsening.  Afib with RVR: HR better controlled after started IV Cardizem gtt Elevated Trop; trending down (0.75-->0.39), no chest pain. EKG no ischemia change   - HR 122 on IV cardizem gtt (staff MD)  P:  - Diuresis as above - Continue Pradaxa -  Continue IV Cardizem gtt,  - start oral Cardizm and titrate down Cardizem gtt -Scheduled lasix  - start lopressor - consider amiodarone or cards consult if no response  RENAL A:    Cre, Mg and K normal P:   - Trend BMP - Address electrolytes as indicated.  GASTROINTESTINAL A:    GERD P:    -  Protonix 40 mg daily (was on lansoprazole 30 MG daily at home)  -  Carb modified diet  INFECTIOUS A:     slight high PCT, suggests possible localized baceterial source for AECOPD  P:  - levaquin po x  4 days  HEMEO A:    Anemia of critical illness P:   - PRBC for hgb </= 6.9gm%    - exceptions are   -  if ACS susepcted/confirmed then transfuse for hgb </= 8.0gm%,  or     active bleeding with hemodynamic instability, then transfuse regardless of hemoglobin value   At at all times try to transfuse 1 unit prbc as possible with exception of active hemorrhage     ENDOCRINE A:    DM-II: A1c 6.7.  CBG 185 in AM P:  - SSI, resistant scale Dc home glyburide in inpatient setting Start lantus  NEUROLOGIC A:    Acute Encephalopathy: significantly improved   -normal exam 02/28/14 P:   - Monitor neurological exams    Move to SDU on PCCM service   Dr. Brand Males, M.D., [facility].C.P Pulmonary and Critical Care Medicine Staff Physician [facility] Pager: [phone], If no answer or between  15:00h - 7:00h: call [phone]  02/28/2014 3:04 PM

## 2014-02-28 NOTE — Progress Notes (Signed)
Weekend CSW spoke with RN who stated that patient discharge not likely today. CSW will continue to follow to assist with discharge to [name] when medically stable.  Tilden Fossa, MSW, [place] Clinical Social Worker [facility] Emergency Dept. [phone]

## 2014-03-01 ENCOUNTER — Inpatient Hospital Stay (HOSPITAL_COMMUNITY): Payer: Medicare Other

## 2014-03-01 DIAGNOSIS — R9431 Abnormal electrocardiogram [ECG] [EKG]: Secondary | ICD-10-CM | POA: Diagnosis present

## 2014-03-01 DIAGNOSIS — N183 Chronic kidney disease, stage 3 unspecified: Secondary | ICD-10-CM

## 2014-03-01 LAB — PRO B NATRIURETIC PEPTIDE: PRO B NATRI PEPTIDE: 5100 pg/mL — AB (ref 0–125)

## 2014-03-01 LAB — GLUCOSE, CAPILLARY
GLUCOSE-CAPILLARY: 130 mg/dL — AB (ref 70–99)
GLUCOSE-CAPILLARY: 285 mg/dL — AB (ref 70–99)
Glucose-Capillary: 102 mg/dL — ABNORMAL HIGH (ref 70–99)
Glucose-Capillary: 166 mg/dL — ABNORMAL HIGH (ref 70–99)
Glucose-Capillary: 236 mg/dL — ABNORMAL HIGH (ref 70–99)
Glucose-Capillary: 260 mg/dL — ABNORMAL HIGH (ref 70–99)
Glucose-Capillary: 344 mg/dL — ABNORMAL HIGH (ref 70–99)

## 2014-03-01 LAB — MAGNESIUM: MAGNESIUM: 2 mg/dL (ref 1.5–2.5)

## 2014-03-01 LAB — BASIC METABOLIC PANEL
BUN: 28 mg/dL — AB (ref 6–23)
CO2: 29 mEq/L (ref 19–32)
CREATININE: 1.12 mg/dL — AB (ref 0.50–1.10)
Calcium: 8.9 mg/dL (ref 8.4–10.5)
Chloride: 94 mEq/L — ABNORMAL LOW (ref 96–112)
GFR calc non Af Amer: 49 mL/min — ABNORMAL LOW (ref >90)
GFR, EST AFRICAN AMERICAN: 57 mL/min — AB (ref >90)
Glucose, Bld: 215 mg/dL — ABNORMAL HIGH (ref 70–99)
Potassium: 4.3 mEq/L (ref 3.7–5.3)
Sodium: 136 mEq/L — ABNORMAL LOW (ref 137–147)

## 2014-03-01 LAB — RESPIRATORY VIRUS PANEL
Adenovirus: NOT DETECTED
INFLUENZA A H3: NOT DETECTED
Influenza A H1: NOT DETECTED
Influenza A: NOT DETECTED
Influenza B: NOT DETECTED
Metapneumovirus: NOT DETECTED
PARAINFLUENZA 1 A: NOT DETECTED
PARAINFLUENZA 2 A: NOT DETECTED
Parainfluenza 3: NOT DETECTED
RESPIRATORY SYNCYTIAL VIRUS A: NOT DETECTED
RESPIRATORY SYNCYTIAL VIRUS B: NOT DETECTED
RHINOVIRUS: NOT DETECTED

## 2014-03-01 LAB — CBC WITH DIFFERENTIAL/PLATELET
BASOS ABS: 0 10*3/uL (ref 0.0–0.1)
Basophils Relative: 0 % (ref 0–1)
Eosinophils Absolute: 0 10*3/uL (ref 0.0–0.7)
Eosinophils Relative: 0 % (ref 0–5)
HCT: 25.8 % — ABNORMAL LOW (ref 36.0–46.0)
Hemoglobin: 8 g/dL — ABNORMAL LOW (ref 12.0–15.0)
LYMPHS PCT: 7 % — AB (ref 12–46)
Lymphs Abs: 0.8 10*3/uL (ref 0.7–4.0)
MCH: 27.9 pg (ref 26.0–34.0)
MCHC: 31 g/dL (ref 30.0–36.0)
MCV: 89.9 fL (ref 78.0–100.0)
Monocytes Absolute: 0.8 10*3/uL (ref 0.1–1.0)
Monocytes Relative: 7 % (ref 3–12)
NEUTROS ABS: 9.9 10*3/uL — AB (ref 1.7–7.7)
NEUTROS PCT: 86 % — AB (ref 43–77)
Platelets: 264 10*3/uL (ref 150–400)
RBC: 2.87 MIL/uL — ABNORMAL LOW (ref 3.87–5.11)
RDW: 17.4 % — AB (ref 11.5–15.5)
WBC: 11.6 10*3/uL — AB (ref 4.0–10.5)

## 2014-03-01 LAB — PHOSPHORUS: PHOSPHORUS: 3.1 mg/dL (ref 2.3–4.6)

## 2014-03-01 MED ORDER — DM-GUAIFENESIN ER 30-600 MG PO TB12
1.0000 | ORAL_TABLET | Freq: Two times a day (BID) | ORAL | Status: DC
  Administered 2014-03-01 – 2014-03-03 (×6): 1 via ORAL
  Administered 2014-03-04: 09:00:00 via ORAL
  Administered 2014-03-04 – 2014-03-12 (×16): 1 via ORAL
  Filled 2014-03-01 (×24): qty 1

## 2014-03-01 NOTE — Consult Note (Signed)
Reason for Consult: Atrial fibrillation with a rapid ventricular response  Referring Physician: Dr. Glenford Peers is an 68 y.o. female.   HPI: The patient is a morbidly obese 68 year old woman with a history of diastolic heart failure, severe COPD, paroxysmal atrial fibrillation, diabetes, and gastroesophageal reflux disease. Over the last 3 months, she has had multiple admissions with acute onset respiratory failure due to a combination of diastolic heart failure and COPD, all exacerbated by atrial fibrillation with a rapid ventricular response. The patient has been on antiarrhythmic drug therapy with flecainide, but has been noted to have prolongation of her QT interval despite relatively low-dose flecainide. She is felt not to be a candidate for amiodarone because of her underlying lung disease, and severe obesity. Her ventricular rates and atrial fibrillation have been very difficult to control. On intravenous Cardizem, her rates have been over 110 beats per minute. She has chronic peripheral edema. She has a very long-standing history of tobacco abuse, but stopped smoking approximately 5 years ago.  PMH: Past Medical History  Diagnosis Date  . Chronic low back pain   . Emphysema   . Paroxysmal atrial fibrillation   . COPD (chronic obstructive pulmonary disease)   . Hyperlipidemia   . Diastolic congestive heart failure   . Obese   . Carotid atherosclerosis   . Lung nodules   . Hoarseness, chronic   . Pneumonia 1990's    "once"  . Type 2 diabetes mellitus   . GERD (gastroesophageal reflux disease)   . Arthritis     "joints" (01/14/2014)  . Depression     "maybe" (01/14/2014    PSHX: Past Surgical History  Procedure Laterality Date  . Esophagogastroduodenoscopy  01/26/2012    Procedure: ESOPHAGOGASTRODUODENOSCOPY (EGD);  Surgeon: Beryle Beams, MD;  Location: [facility];  Service: Endoscopy;  Laterality: N/A;  . Colonoscopy  01/26/2012    Procedure: COLONOSCOPY;   Surgeon: Beryle Beams, MD;  Location: [facility];  Service: Endoscopy;  Laterality: N/A;  . Breast biopsy Left ~ 1980    "solid"  . Breast lumpectomy Left ~ 1980    "removed a mass; it was benign" (01/14/2014)  . Total abdominal hysterectomy  1994    FAMHX: Family History  Problem Relation Age of Onset  . Breast cancer Sister   . Heart disease    . Hodgkin's lymphoma    . Asthma Mother     Social History:  reports that she quit smoking about 5 years ago. Her smoking use included Cigarettes. She has a 135 pack-year smoking history. She has never used smokeless tobacco. She reports that she drinks alcohol. She reports that she does not use illicit drugs.  Allergies:  Allergies  Allergen Reactions  . Ace Inhibitors Cough  . Clindamycin Rash    Medications: Reviewed  Dg Chest Port 1 View  03/01/2014   CLINICAL DATA:  COPD  EXAM: PORTABLE CHEST - 1 VIEW  COMPARISON:  02/28/2014  FINDINGS: Stable cardiomegaly with mild interstitial edema pattern and basilar atelectasis, worse on the left. No enlarging effusion or pneumothorax. Trachea midline. Atherosclerosis of the aorta.  IMPRESSION: Stable mild edema pattern.   Electronically Signed   By: Daryll Brod M.D.   On: 03/01/2014 08:09   Dg Chest Port 1 View  02/28/2014   CLINICAL DATA:  Respiratory failure.  EXAM: PORTABLE CHEST - 1 VIEW  COMPARISON:  DG CHEST 1V PORT dated 02/27/2014; DG CHEST 1V PORT dated 02/26/2014; DG CHEST 1V PORT dated  01/14/2014  FINDINGS: 0547 hr. There is stable cardiomegaly and aortic atherosclerosis. Interstitial edema has improved over the last 2 days. There is no confluent airspace opacity, significant pleural effusion or pneumothorax. The osseous structures appear unchanged for  IMPRESSION: Improving pulmonary edema with persistent cardiomegaly and residual vascular congestion.   Electronically Signed   By: Camie Patience M.D.   On: 02/28/2014 07:50   Dg Hip Portable 1 View Right  02/27/2014   CLINICAL DATA:  Right  hip pain  EXAM: PORTABLE RIGHT HIP - 1 VIEW  COMPARISON:  None.  FINDINGS: Single frontal view the right hip was obtained and reveals no acute fracture dislocation. No gross soft tissue abnormality is noted.  IMPRESSION: No acute abnormality seen.   Electronically Signed   By: Inez Catalina M.D.   On: 02/27/2014 14:56    ROS  As stated in the HPI and negative for all other systems.  Physical Exam  Vitals:Blood pressure 94/63, pulse 111, temperature 97.8 F (36.6 C), temperature source Oral, resp. rate 20, height 5\' 5"  (1.651 m), weight 299 lb 13.2 oz (136 kg), SpO2 90.00%.  Chronically ill appearing morbidly obese, 68 year old woman, NAD HEENT: Unremarkable Neck:  Unable to assess JVD, no thyromegally Back:  No CVA tenderness Lungs:  Scattered wheezes and rales. HEART:  IRegular tachycardia rhythm, no murmurs, no rubs, no clicks Abd:  soft, positive bowel sounds, massive obesity, no organomegally, no rebound, no guarding Ext:  2 plus pulses, 2+ peripheral edema, no cyanosis, no clubbing Skin:  No rashes no nodules Neuro:  CN II through XII intact, motor grossly intact  ECG  - Atrial fibrillation with a rapid ventricular response  Chest x-ray - reviewed  Assessment/Plan: 1. atrial fibrillation with an uncontrolled ventricular response 2. acute diastolic heart failure, recurrent 3. severe COPD 4. long-standing tobacco abuse, in remission 5. massive obesity, weight 300 pounds 6. prolongation of the QT interval Recommendation: The constellation of the above problems coupled with her multiple admissions for respiratory failure in the setting of atrial fibrillation with a rapid ventricular response, in conjunction with prolongation of the QT interval, in conjunction with severe underlying lung disease makes all antiarrhythmic options for maintenance of sinus rhythm improbable at best. I've discussed the treatment options in detail with the patient. AV node ablation, and insertion of a  ventricular pacemaker is warranted. I've discussed the risk and benefits of this procedure with the patient in detail. While she is not a great candidate for this procedure with her multiple comorbidities, I think is her only good option. She is reflecting on this and will let us know if she would like to proceed with pacemaker and AV node ablation. For now, would attempt to optimize her fluid status, continue intravenous calcium channel blockers, and treat her underlying lung disease.  Cristopher Peru, M.D.  Carleene Overlie TaylorMD 03/01/2014, 11:46 AM

## 2014-03-01 NOTE — Consult Note (Signed)
Reason for Consult: Recurrent PAF with RVR  Requesting Physician: CCM  HPI: This is a 68 y.o. female with a past medical history significant for morbid obesity, COPD, diastolic CHF, CRI -3. She had a prolonged admission with respiratory failure 2/18-2/27/ 15 and was discharged to SNF in NSR. She was re admitted 01/26/14 with recurrent respiratory failure. During that admission she again had PAF and it was decided to put her on Flecainide 50 mg BID. She again was discharged in NSR. She presented 02/24/14 in respiratory failure and was intubated. On admission her Flecainide was held She was intubated and her QTc was noted to be 495. She was extubated 02/25/14 and her Flecainide has not been resumed. She is in AF with RVR (120) on Diltiazem. We have been asked to see her in consult for PAF. She is extubated and appears relatively comfortable at rest. Her QTc was 529 on 4/3.   PMHx:  Past Medical History  Diagnosis Date  . Chronic low back pain   . Emphysema   . Paroxysmal atrial fibrillation   . COPD (chronic obstructive pulmonary disease)   . Hyperlipidemia   . Diastolic congestive heart failure   . Obese   . Carotid atherosclerosis   . Lung nodules   . Hoarseness, chronic   . Pneumonia 1990's    "once"  . Type 2 diabetes mellitus   . GERD (gastroesophageal reflux disease)   . Arthritis     "joints" (01/14/2014)  . Depression     "maybe" (01/14/2014   Past Surgical History  Procedure Laterality Date  . Esophagogastroduodenoscopy  01/26/2012    Procedure: ESOPHAGOGASTRODUODENOSCOPY (EGD);  Surgeon: Patrick D Hung, MD;  Location: [facility];  Service: Endoscopy;  Laterality: N/A;  . Colonoscopy  01/26/2012    Procedure: COLONOSCOPY;  Surgeon: Patrick D Hung, MD;  Location: [facility];  Service: Endoscopy;  Laterality: N/A;  . Breast biopsy Left ~ 1980    "solid"  . Breast lumpectomy Left ~ 1980    "removed a mass; it was benign" (01/14/2014)  . Total abdominal hysterectomy  1994     FAMHx: breast cancer, heart disease   SOCHx:  reports that she quit smoking about 5 years ago. Her smoking use included Cigarettes. She has a 135 pack-year smoking history. She has never used smokeless tobacco. She reports that she drinks alcohol. She reports that she does not use illicit drugs.  ALLERGIES: Allergies  Allergen Reactions  . Ace Inhibitors Cough  . Clindamycin Rash    ROS: Pertinent items are noted in HPI. See H&P for complete details  HOME MEDICATIONS: Prescriptions prior to admission  Medication Sig Dispense Refill  . acetaminophen (TYLENOL) 325 MG tablet Take 325 mg by mouth daily as needed for mild pain.       . albuterol (PROAIR HFA) 108 (90 BASE) MCG/ACT inhaler Inhale 2 puffs into the lungs every 6 (six) hours as needed for shortness of breath.  1 Inhaler  3  . Amino Acids-Protein Hydrolys (FEEDING SUPPLEMENT, PRO-STAT SUGAR FREE 64,) LIQD Take 30 mLs by mouth 2 (two) times daily.      . Ascorbic Acid (VITAMIN C) 500 MG CAPS Take 1 tablet by mouth daily.      . bisacodyl (DULCOLAX) 10 MG suppository Place 1 suppository (10 mg total) rectally daily as needed for moderate constipation.  12 suppository  0  . budesonide-formoterol (SYMBICORT) 80-4.5 MCG/ACT inhaler Inhale 2 puffs into the lungs 2 (two) times daily.  1 Inhaler  5  .   Ca & Phos-Vit D-Mag (CALCIUM) [id number] TABS Take 1 tablet by mouth daily.       . Cholecalciferol (VITAMIN D) 2000 UNITS tablet Take 2,000 Units by mouth daily.      . dabigatran (PRADAXA) 150 MG CAPS capsule Take 1 capsule (150 mg total) by mouth 2 (two) times daily.  60 capsule    . diltiazem (CARDIZEM CD) 300 MG 24 hr capsule Take 1 capsule (300 mg total) by mouth daily.  30 capsule  0  . ezetimibe-simvastatin (VYTORIN) 10-40 MG per tablet Take 1 tablet by mouth at bedtime.       . flecainide (TAMBOCOR) 50 MG tablet Take 1 tablet (50 mg total) by mouth every 12 (twelve) hours.  60 tablet  0  . furosemide (LASIX) 40 MG tablet  Take 1 tablet (40 mg total) by mouth 2 (two) times daily.  60 tablet  0  . glyBURIDE micronized (GLYNASE) 3 MG tablet Take 3 mg by mouth daily.       . guaiFENesin-codeine 100-10 MG/5ML syrup Take 5 mLs by mouth every 6 (six) hours as needed for cough.  120 mL  0  . ipratropium-albuterol (DUONEB) 0.5-2.5 (3) MG/3ML SOLN Take 3 mLs by nebulization 2 (two) times daily.  360 mL    . lansoprazole (PREVACID) 30 MG capsule Take 30 mg by mouth daily at 12 noon.      . Magnesium-Zinc 133.33-5 MG TABS Take 133 mg by mouth daily.      . Multiple Vitamins-Iron (MULTIVITAMIN/IRON PO) Take 1 tablet by mouth daily.      . potassium chloride SA (K-DUR,KLOR-CON) 20 MEQ tablet Take 20 mEq by mouth daily.       . tiotropium (SPIRIVA) 18 MCG inhalation capsule Place 18 mcg into inhaler and inhale daily.      . vitamin B-12 (CYANOCOBALAMIN) 500 MCG tablet Take 500 mcg by mouth daily.      . vitamin E 400 UNIT capsule Take 400 Units by mouth daily.        HOSPITAL MEDICATIONS: I have reviewed the patient's current medications.  VITALS: Blood pressure 121/65, pulse 118, temperature 97.8 F (36.6 C), temperature source Oral, resp. rate 17, height 5' 5" (1.651 m), weight 299 lb 13.2 oz (136 kg), SpO2 95.00%.  PHYSICAL EXAM: General appearance: alert, cooperative, no distress and morbidly obese Neck: Rt carotid bruit Lungs: decreased breath sounds Heart: irregularly irregular rhythm Abdomen: obese Extremities: trace edema Pulses: diminnished Skin: pale cool dry Neurologic: Grossly normal  LABS: Results for orders placed during the hospital encounter of 02/24/14 (from the past 48 hour(s))  GLUCOSE, CAPILLARY     Status: Abnormal   Collection Time    02/27/14 12:19 PM      Result Value Ref Range   Glucose-Capillary 205 (*) 70 - 99 mg/dL  TROPONIN I     Status: None   Collection Time    02/27/14 12:20 PM      Result Value Ref Range   Troponin I <0.30  <0.30 ng/mL   Comment:            Due to the  release kinetics of cTnI,     a negative result within the first hours     of the onset of symptoms does not rule out     myocardial infarction with certainty.     If myocardial infarction is still suspected,     repeat the test at appropriate intervals.  RESPIRATORY VIRUS PANEL       Status: None   Collection Time    02/27/14 12:30 PM      Result Value Ref Range   Source - [facility] NASAL SWAB     Comment: CORRECTED ON 04/05 AT 0134: PREVIOUSLY REPORTED AS NASAL SWAB   Respiratory Syncytial Virus A NOT DETECTED     Respiratory Syncytial Virus B NOT DETECTED     Influenza A NOT DETECTED     Influenza B NOT DETECTED     Parainfluenza 1 NOT DETECTED     Parainfluenza 2 NOT DETECTED     Parainfluenza 3 NOT DETECTED     Metapneumovirus NOT DETECTED     Rhinovirus NOT DETECTED     Adenovirus NOT DETECTED     Influenza A H1 NOT DETECTED     Influenza A H3 NOT DETECTED     Comment: (NOTE)           Normal Reference Range for each Analyte: NOT DETECTED     Testing performed using the Lumine[employer] xTAG Respiratory Viral Panel test     kit.     This test was developed and its performance characteristics determined     by [facility]. It has not been cleared or approved by the [place]     Food and Drug Administration. This test is used for clinical purposes.     It should not be regarded as investigational or for research. This     laboratory is certified under the Clinical Laboratory Improvement     Amendments of 1988 (CLIA) as qualified to perform high complexity     clinical laboratory testing.     Performed at [facility]  GLUCOSE, CAPILLARY     Status: Abnormal   Collection Time    02/27/14  3:55 PM      Result Value Ref Range   Glucose-Capillary 248 (*) 70 - 99 mg/dL  GLUCOSE, CAPILLARY     Status: Abnormal   Collection Time    02/27/14  7:13 PM      Result Value Ref Range   Glucose-Capillary 278 (*) 70 - 99 mg/dL  BASIC METABOLIC PANEL     Status: Abnormal   Collection  Time    02/27/14  8:55 PM      Result Value Ref Range   Sodium 140  137 - 147 mEq/L   Potassium 5.1  3.7 - 5.3 mEq/L   Chloride 97  96 - 112 mEq/L   CO2 28  19 - 32 mEq/L   Glucose, Bld 307 (*) 70 - 99 mg/dL   BUN 26 (*) 6 - 23 mg/dL   Creatinine, Ser 0.97  0.50 - 1.10 mg/dL   Calcium 8.9  8.4 - 10.5 mg/dL   GFR calc non Af Amer 59 (*) >90 mL/min   GFR calc Af Amer 68 (*) >90 mL/min   Comment: (NOTE)     The eGFR has been calculated using the CKD EPI equation.     This calculation has not been validated in all clinical situations.     eGFR's persistently <90 mL/min signify possible Chronic Kidney     Disease.  PROCALCITONIN     Status: None   Collection Time    02/27/14  8:55 PM      Result Value Ref Range   Procalcitonin 0.79     Comment:            Interpretation:     PCT > 0.5 ng/mL and <= 2 ng/mL:       Systemic infection (sepsis) is possible,     but other conditions are known to elevate     PCT as well.     (NOTE)             ICU PCT Algorithm               Non ICU PCT Algorithm        ----------------------------     ------------------------------             PCT < 0.25 ng/mL                 PCT < 0.1 ng/mL         Stopping of antibiotics            Stopping of antibiotics           strongly encouraged.               strongly encouraged.        ----------------------------     ------------------------------           PCT level decrease by               PCT < 0.25 ng/mL           >= 80% from peak PCT           OR PCT 0.25 - 0.5 ng/mL          Stopping of antibiotics                                                 encouraged.         Stopping of antibiotics               encouraged.        ----------------------------     ------------------------------           PCT level decrease by              PCT >= 0.25 ng/mL           < 80% from peak PCT            AND PCT >= 0.5 ng/mL            Continuing antibiotics                                                  encouraged.            Continuing antibiotics                encouraged.        ----------------------------     ------------------------------         PCT level increase compared          PCT > 0.5 ng/mL             with peak PCT AND              PCT >= 0.5 ng/mL             Escalation of antibiotics                                                strongly encouraged.          Escalation of antibiotics            strongly encouraged.  GLUCOSE, CAPILLARY     Status: Abnormal   Collection Time    02/28/14 12:11 AM      Result Value Ref Range   Glucose-Capillary 285 (*) 70 - 99 mg/dL   Comment 1 Documented in Chart     Comment 2 Notify RN    CBC     Status: Abnormal   Collection Time    02/28/14  3:10 AM      Result Value Ref Range   WBC 12.0 (*) 4.0 - 10.5 K/uL   RBC 2.75 (*) 3.87 - 5.11 MIL/uL   Hemoglobin 7.6 (*) 12.0 - 15.0 g/dL   HCT 24.9 (*) 36.0 - 46.0 %   MCV 90.5  78.0 - 100.0 fL   MCH 27.6  26.0 - 34.0 pg   MCHC 30.5  30.0 - 36.0 g/dL   RDW 17.3 (*) 11.5 - 15.5 %   Platelets 254  150 - 400 K/uL  BASIC METABOLIC PANEL     Status: Abnormal   Collection Time    02/28/14  3:10 AM      Result Value Ref Range   Sodium 138  137 - 147 mEq/L   Potassium 4.5  3.7 - 5.3 mEq/L   Chloride 95 (*) 96 - 112 mEq/L   CO2 27  19 - 32 mEq/L   Glucose, Bld 260 (*) 70 - 99 mg/dL   BUN 25 (*) 6 - 23 mg/dL   Creatinine, Ser 0.89  0.50 - 1.10 mg/dL   Calcium 8.6  8.4 - 10.5 mg/dL   GFR calc non Af Amer 65 (*) >90 mL/min   GFR calc Af Amer 75 (*) >90 mL/min   Comment: (NOTE)     The eGFR has been calculated using the CKD EPI equation.     This calculation has not been validated in all clinical situations.     eGFR's persistently <90 mL/min signify possible Chronic Kidney     Disease.  MAGNESIUM     Status: None   Collection Time    02/28/14  3:10 AM      Result Value Ref Range   Magnesium 2.0  1.5 - 2.5 mg/dL  GLUCOSE, CAPILLARY     Status: Abnormal   Collection Time    02/28/14  4:06 AM       Result Value Ref Range   Glucose-Capillary 242 (*) 70 - 99 mg/dL   Comment 1 Documented in Chart     Comment 2 Notify RN    GLUCOSE, CAPILLARY     Status: Abnormal   Collection Time    02/28/14  7:13 AM      Result Value Ref Range   Glucose-Capillary 190 (*) 70 - 99 mg/dL  GLUCOSE, CAPILLARY     Status: Abnormal   Collection Time    02/28/14 11:33 AM      Result Value Ref Range   Glucose-Capillary 239 (*) 70 - 99 mg/dL  GLUCOSE, CAPILLARY     Status: Abnormal   Collection Time    02/28/14  3:10 PM      Result Value Ref Range   Glucose-Capillary 390 (*) 70 - 99 mg/dL  GLUCOSE, CAPILLARY     Status: Abnormal   Collection Time    02/28/14  6:36 PM      Result Value Ref Range     Glucose-Capillary 355 (*) 70 - 99 mg/dL  GLUCOSE, CAPILLARY     Status: Abnormal   Collection Time    02/28/14  7:50 PM      Result Value Ref Range   Glucose-Capillary 343 (*) 70 - 99 mg/dL   Comment 1 Notify RN    GLUCOSE, CAPILLARY     Status: Abnormal   Collection Time    02/28/14 11:59 PM      Result Value Ref Range   Glucose-Capillary 260 (*) 70 - 99 mg/dL   Comment 1 Notify RN    PRO B NATRIURETIC PEPTIDE     Status: Abnormal   Collection Time    03/01/14  2:56 AM      Result Value Ref Range   Pro B Natriuretic peptide (BNP) 5100.0 (*) 0 - 125 pg/mL  MAGNESIUM     Status: None   Collection Time    03/01/14  2:56 AM      Result Value Ref Range   Magnesium 2.0  1.5 - 2.5 mg/dL  PHOSPHORUS     Status: None   Collection Time    03/01/14  2:56 AM      Result Value Ref Range   Phosphorus 3.1  2.3 - 4.6 mg/dL  BASIC METABOLIC PANEL     Status: Abnormal   Collection Time    03/01/14  2:56 AM      Result Value Ref Range   Sodium 136 (*) 137 - 147 mEq/L   Potassium 4.3  3.7 - 5.3 mEq/L   Chloride 94 (*) 96 - 112 mEq/L   CO2 29  19 - 32 mEq/L   Glucose, Bld 215 (*) 70 - 99 mg/dL   BUN 28 (*) 6 - 23 mg/dL   Creatinine, Ser 1.12 (*) 0.50 - 1.10 mg/dL   Calcium 8.9  8.4 - 10.5 mg/dL   GFR  calc non Af Amer 49 (*) >90 mL/min   GFR calc Af Amer 57 (*) >90 mL/min   Comment: (NOTE)     The eGFR has been calculated using the CKD EPI equation.     This calculation has not been validated in all clinical situations.     eGFR's persistently <90 mL/min signify possible Chronic Kidney     Disease.  CBC WITH DIFFERENTIAL     Status: Abnormal   Collection Time    03/01/14  2:56 AM      Result Value Ref Range   WBC 11.6 (*) 4.0 - 10.5 K/uL   RBC 2.87 (*) 3.87 - 5.11 MIL/uL   Hemoglobin 8.0 (*) 12.0 - 15.0 g/dL   HCT 25.8 (*) 36.0 - 46.0 %   MCV 89.9  78.0 - 100.0 fL   MCH 27.9  26.0 - 34.0 pg   MCHC 31.0  30.0 - 36.0 g/dL   RDW 17.4 (*) 11.5 - 15.5 %   Platelets 264  150 - 400 K/uL   Neutrophils Relative % 86 (*) 43 - 77 %   Neutro Abs 9.9 (*) 1.7 - 7.7 K/uL   Lymphocytes Relative 7 (*) 12 - 46 %   Lymphs Abs 0.8  0.7 - 4.0 K/uL   Monocytes Relative 7  3 - 12 %   Monocytes Absolute 0.8  0.1 - 1.0 K/uL   Eosinophils Relative 0  0 - 5 %   Eosinophils Absolute 0.0  0.0 - 0.7 K/uL   Basophils Relative 0  0 - 1 %   Basophils Absolute 0.0  0.0 -   0.1 K/uL  GLUCOSE, CAPILLARY     Status: Abnormal   Collection Time    03/01/14  4:06 AM      Result Value Ref Range   Glucose-Capillary 166 (*) 70 - 99 mg/dL   Comment 1 Notify RN    GLUCOSE, CAPILLARY     Status: Abnormal   Collection Time    03/01/14  7:29 AM      Result Value Ref Range   Glucose-Capillary 102 (*) 70 - 99 mg/dL    EKG: AF, incomplete RBBB, prolonged QTc  IMAGING: Dg Chest Port 1 View  03/01/2014   CLINICAL DATA:  COPD  EXAM: PORTABLE CHEST - 1 VIEW  COMPARISON:  02/28/2014  FINDINGS: Stable cardiomegaly with mild interstitial edema pattern and basilar atelectasis, worse on the left. No enlarging effusion or pneumothorax. Trachea midline. Atherosclerosis of the aorta.  IMPRESSION: Stable mild edema pattern.   Electronically Signed   By: Trevor  Shick M.D.   On: 03/01/2014 08:09   Dg Chest Port 1 View  02/28/2014    CLINICAL DATA:  Respiratory failure.  EXAM: PORTABLE CHEST - 1 VIEW  COMPARISON:  DG CHEST 1V PORT dated 02/27/2014; DG CHEST 1V PORT dated 02/26/2014; DG CHEST 1V PORT dated 01/14/2014  FINDINGS: 0547 hr. There is stable cardiomegaly and aortic atherosclerosis. Interstitial edema has improved over the last 2 days. There is no confluent airspace opacity, significant pleural effusion or pneumothorax. The osseous structures appear unchanged for  IMPRESSION: Improving pulmonary edema with persistent cardiomegaly and residual vascular congestion.   Electronically Signed   By: Bill  Veazey M.D.   On: 02/28/2014 07:50   Dg Hip Portable 1 View Right  02/27/2014   CLINICAL DATA:  Right hip pain  EXAM: PORTABLE RIGHT HIP - 1 VIEW  COMPARISON:  None.  FINDINGS: Single frontal view the right hip was obtained and reveals no acute fracture dislocation. No gross soft tissue abnormality is noted.  IMPRESSION: No acute abnormality seen.   Electronically Signed   By: Mark  Lukens M.D.   On: 02/27/2014 14:56    IMPRESSION: Active Problems:   Acute respiratory failure   Acute on chronic diastolic congestive heart failure   Atrial fibrillation with RVR   COPD exacerbation   COPD with emphysema    PAF (paroxysmal atrial fibrillation)   Pulmonary HTN- pa 53 mmHg Echo 01/15/14   Morbid obesity- BMI 50   Poorly controlled diabetes mellitus   Chronic renal disease, stage III   Hypoxemia   Prolonged Q-T interval on ECG   HYPERLIPIDEMIA-MIXED   CAROTID BRUIT   HTN (hypertension)   Chronic anticoagulation   RECOMMENDATION: See my consultation note  Time Spent Directly with Patient: 45 minutes  KILROY,LUKE [phone] beeper 03/01/2014, 9:46 AM   EP attending  Patient seen and examined. I have reviewed the findings above. I agree with the above note. Please see my consultation note which follows this for additional information.  Mayana Irigoyen, M.D. 

## 2014-03-01 NOTE — Progress Notes (Signed)
Name: Paige Hardin MRN: [id number] DOB: May 07, 1946 LOS: 5 Hardin,Paige A, MD - pcp   PCCM PROGRESS NOTE  Brief admission history: The patient is 68 yo lady with PMH of COPD (dr clance) and dCHF with EF of 60% (2D echo on 01/15/14), who was admitted due to combiced COPD and CHF exacerbation on 02/24/14.  She was intubated and reated with steroids, abx, diureses. She was extubated on 4/1.    has a past medical history of Chronic low back pain; Emphysema; Paroxysmal atrial fibrillation; COPD (chronic obstructive pulmonary disease); Hyperlipidemia; Diastolic congestive heart failure; Obese; Carotid atherosclerosis; Lung nodules; Hoarseness, chronic; Pneumonia (1990's); Type 2 diabetes mellitus; GERD (gastroesophageal reflux disease); Arthritis; and Depression.   has past surgical history that includes Esophagogastroduodenoscopy (01/26/2012); Colonoscopy (01/26/2012); Breast biopsy (Left, ~ 1980); Breast lumpectomy (Left, ~ 1980); and Total abdominal hysterectomy (1994).   reports that she quit smoking about 5 years ago. Her smoking use included Cigarettes. She has a 135 pack-year smoking history. She has never used smokeless tobacco.  LINES / TUBES: OETT 3/31>>>4/1  CULTURES: UA 3/31>> negative for UTI UC 3/31>>negative  U. Strep Antigen 3/31>>negative BCx2 3/31>> ....Marland KitchenMarland KitchenMarland Kitchen resp virus panel 02/27/14>>4/4 negative  ANTIBIOTICS: Vanco 3/31 (empiric)>>4/1 Zosyn 3/31 (empiric)>>4/1 Levaquin 4/4 >> (4 days)    SIGNIFICANT EVENTS / STUDIES:   3/31 - worsening SOB at SNF despite multiple albuterol Rx, intubated in ER.  CXR c/w edema on admit 4/1- XCR: No pneumothorax. CHF with mild improvement since yesterday.  4/1- Extubated 4/2- Had worsening SOB and Afb-RVR, responded to extra dose of Lasix and IV Cardizem 4/2-CXR worsening pulmonary edema 4/3- CXR improving pulmonary edema 4/3- LE doppler negative for DVT 4/3- X-ray right Hip, no acute abnormality seen. 4/4- CXR Improving pulmonary edema  with persistent cardiomegaly 4/4- started levaquin  SUBJECTIVE/OVERNIGHT/INTERVAL HX - Feels better, No chest pain, fever or chills.  - Patient cough with small amount of dark colored sputum - Right hip pain is improved - HR is ~90 to 130 min. Still on cardizem gtt due to A Fib with HR 120 (Staff MD: main issue keeping her in ICU)   Vital Signs: Filed Vitals:   03/01/14 0500  BP: 118/62  Pulse: 108  Temp:   Resp: 19   I/O last 3 completed shifts: In: 2834.2 [P.O.:1980; I.V.:854.2] Out: 3250 [Urine:3250]  -2.3L sinxe 7 days  Physical Examination:  General: No Acute disdress HEENT: PERRL, EOMI, no scleral icterus Cardiac: S1/S2, Irregular, No Murmurs, No gallops or rubs Pulm: scattered rhonchi Abd: Soft,  nondistended, nontender, no rebound pain, no organomegaly, BS present Ext: 1+ pitting leg edema bilaterally. Has right hip and upper leg pain posteriorly, which is better Neuro: alert and oriented X3, moves all extremeties, sensation to light touch intact.  Skin: No rashes  PULMONARY  Recent Labs Lab 02/24/14 2312 02/25/14 0040 02/25/14 0430  PHART 7.220* 7.386 7.517*  PCO2ART 78.6* 43.3 31.7*  PO2ART 79.0* 91.3 72.7*  HCO3 32.5* 25.8* 25.7*  TCO2 35 27.2 26.7  O2SAT 93.0 97.5 96.6    CBC  Recent Labs Lab 02/27/14 0045 02/28/14 0310 03/01/14 0256  HGB 7.4* 7.6* 8.0*  HCT 24.2* 24.9* 25.8*  WBC 18.3* 12.0* 11.6*  PLT 285 254 264    COAGULATION  Recent Labs Lab 02/24/14 2220  INR 1.27    CARDIAC    Recent Labs Lab 02/24/14 2247 02/25/14 0205 02/25/14 1000 02/26/14 0800 02/27/14 1220  TROPONINI <0.30 <0.30 0.75* 0.39* <0.30    Recent Labs Lab 02/24/14 2220  02/27/14 0540 03/01/14 0256  PROBNP 1615.0* 3380.0* 5100.0*     CHEMISTRY  Recent Labs Lab 02/26/14 0154 02/27/14 0045 02/27/14 2055 02/28/14 0310 03/01/14 0256  NA 138 138 140 138 136*  K 4.4 4.3 5.1 4.5 4.3  CL 95* 97 97 95* 94*  CO2 29 30 28 27 29   GLUCOSE 213*  192* 307* 260* 215*  BUN 29* 28* 26* 25* 28*  CREATININE 1.11* 0.99 0.97 0.89 1.12*  CALCIUM 9.1 8.5 8.9 8.6 8.9  MG  --  2.0  --  2.0 2.0  PHOS  --   --   --   --  3.1   Estimated Creatinine Clearance: 67.2 ml/min (by C-G formula based on Cr of 1.12).   LIVER  Recent Labs Lab 02/24/14 2220  AST 20  ALT 16  ALKPHOS 96  BILITOT 0.7  PROT 7.3  ALBUMIN 3.3*  INR 1.27     INFECTIOUS  Recent Labs Lab 02/24/14 2236 02/27/14 2055  LATICACIDVEN 0.90  --   PROCALCITON  --  0.79     ENDOCRINE CBG (last 3)   Recent Labs  02/28/14 1950 02/28/14 2359 03/01/14 0406  GLUCAP 343* 260* 166*    IMAGING x48h  Dg Chest Port 1 View  02/28/2014   CLINICAL DATA:  Respiratory failure.  EXAM: PORTABLE CHEST - 1 VIEW  COMPARISON:  DG CHEST 1V PORT dated 02/27/2014; DG CHEST 1V PORT dated 02/26/2014; DG CHEST 1V PORT dated 01/14/2014  FINDINGS: 0547 hr. There is stable cardiomegaly and aortic atherosclerosis. Interstitial edema has improved over the last 2 days. There is no confluent airspace opacity, significant pleural effusion or pneumothorax. The osseous structures appear unchanged for  IMPRESSION: Improving pulmonary edema with persistent cardiomegaly and residual vascular congestion.   Electronically Signed   By: Camie Patience M.D.   On: 02/28/2014 07:50   Dg Hip Portable 1 View Right  02/27/2014   CLINICAL DATA:  Right hip pain  EXAM: PORTABLE RIGHT HIP - 1 VIEW  COMPARISON:  None.  FINDINGS: Single frontal view the right hip was obtained and reveals no acute fracture dislocation. No gross soft tissue abnormality is noted.  IMPRESSION: No acute abnormality seen.   Electronically Signed   By: Inez Catalina M.D.   On: 02/27/2014 14:56    PULMONARY A: Acute Hypercarbic Respiratory Failure: resolved Pulmonary Edema: improving COPD: Blood and respiratory cultures: no growth. Respiratory virus panel negative. Wheezing improved  P:    - Scheduled BD's: changed to xopenex q4h, continue  atrovent q4h + pulmicort neb bid - prednisone taper for total of 5 days - continue Levaquin total 4 days (see flows) - start Muccinex  CARDIOVASCULAR A:   CHF exacerbation (ECHO 12/2103 with EF 60% and mild LVH): Pro BNP 1615-->3380-->5100.  Afib with RVR: still needs IV Cardizem gtt. On both oral and IV Cardizem, and Lopressor now Elevated Trop: normalized on 02/27/14. No chest pain.   P:  - Continue Pradaxa - Continue IV Cardizem gtt, will titrate down as tolerated - continue oral Cardizem. - Scheduled lasix 40 mg iv bid - Lopressor: 12.5 mg bid. - will consult to cards consult, may need amiodarone (staff MD: check with SDU if they can initiate amiodarone so she can be transferred)  RENAL A:    - Mg and K normal - Cre slightly up 0.89-->1.12 and HCO3 25-->28 P:   - Trend BMP - Address electrolytes as indicated. - If rising might need to back of lasix (staff MD  comment)  GASTROINTESTINAL A:    GERD P:    -  Protonix 40 mg daily (was on lansoprazole 30 MG daily at home)  -  Carb modified diet  INFECTIOUS A:     slight high PCT, suggests possible localized baceterial source for AECOPD  P:  - levaquin po for total of 4 days frmo 02/28/14 (staff MD comment)  HEMEO A:    Anemia of critical illness P:   - PRBC for hgb </= 6.9gm%    - exceptions are   -  if ACS susepcted/confirmed then transfuse for hgb </= 8.0gm%,  or     active bleeding with hemodynamic instability, then transfuse regardless of hemoglobin value   At at all times try to transfuse 1 unit prbc as possible with exception of active hemorrhage  ENDOCRINE A:    DM-II: A1c 6.7.  CBG 168 in AM P:  - SSI, resistant scale - Lantus: 5 U daily  NEUROLOGIC A:    Acute Encephalopathy: resolved  -normal exam 03/01/14 P:   - Monitor neurological exams  Ivor Costa PGY3 [phone]    STAFF NOTE  - see my comments above. Await cards consult but move to sdu esp if they can do amio there. Stays on PCCM (Dr Gwenette Greet  office patient). Rest per NP  Dr. Brand Males, M.D., [facility].C.P Pulmonary and Critical Care Medicine Staff Physician [facility] Pager: [phone], If no answer or between  15:00h - 7:00h: call [phone]  03/01/2014 5:59 AM

## 2014-03-02 ENCOUNTER — Telehealth: Payer: Self-pay | Admitting: Cardiology

## 2014-03-02 DIAGNOSIS — I2789 Other specified pulmonary heart diseases: Secondary | ICD-10-CM

## 2014-03-02 DIAGNOSIS — E119 Type 2 diabetes mellitus without complications: Secondary | ICD-10-CM

## 2014-03-02 LAB — GLUCOSE, CAPILLARY
GLUCOSE-CAPILLARY: 154 mg/dL — AB (ref 70–99)
GLUCOSE-CAPILLARY: 125 mg/dL — AB (ref 70–99)
GLUCOSE-CAPILLARY: 323 mg/dL — AB (ref 70–99)
Glucose-Capillary: 238 mg/dL — ABNORMAL HIGH (ref 70–99)
Glucose-Capillary: 259 mg/dL — ABNORMAL HIGH (ref 70–99)
Glucose-Capillary: 314 mg/dL — ABNORMAL HIGH (ref 70–99)

## 2014-03-02 LAB — PHOSPHORUS: Phosphorus: 3.9 mg/dL (ref 2.3–4.6)

## 2014-03-02 LAB — CBC WITH DIFFERENTIAL/PLATELET
BASOS PCT: 0 % (ref 0–1)
Basophils Absolute: 0 10*3/uL (ref 0.0–0.1)
EOS PCT: 0 % (ref 0–5)
Eosinophils Absolute: 0 10*3/uL (ref 0.0–0.7)
HCT: 27 % — ABNORMAL LOW (ref 36.0–46.0)
Hemoglobin: 8.2 g/dL — ABNORMAL LOW (ref 12.0–15.0)
LYMPHS ABS: 1.1 10*3/uL (ref 0.7–4.0)
Lymphocytes Relative: 9 % — ABNORMAL LOW (ref 12–46)
MCH: 27.2 pg (ref 26.0–34.0)
MCHC: 30.4 g/dL (ref 30.0–36.0)
MCV: 89.4 fL (ref 78.0–100.0)
Monocytes Absolute: 1 10*3/uL (ref 0.1–1.0)
Monocytes Relative: 8 % (ref 3–12)
Neutro Abs: 9.8 10*3/uL — ABNORMAL HIGH (ref 1.7–7.7)
Neutrophils Relative %: 83 % — ABNORMAL HIGH (ref 43–77)
Platelets: 309 10*3/uL (ref 150–400)
RBC: 3.02 MIL/uL — AB (ref 3.87–5.11)
RDW: 17.1 % — ABNORMAL HIGH (ref 11.5–15.5)
WBC: 11.9 10*3/uL — ABNORMAL HIGH (ref 4.0–10.5)

## 2014-03-02 LAB — BASIC METABOLIC PANEL
BUN: 27 mg/dL — ABNORMAL HIGH (ref 6–23)
CO2: 30 mEq/L (ref 19–32)
CREATININE: 1.07 mg/dL (ref 0.50–1.10)
Calcium: 8.7 mg/dL (ref 8.4–10.5)
Chloride: 93 mEq/L — ABNORMAL LOW (ref 96–112)
GFR calc Af Amer: 60 mL/min — ABNORMAL LOW (ref >90)
GFR, EST NON AFRICAN AMERICAN: 52 mL/min — AB (ref >90)
GLUCOSE: 160 mg/dL — AB (ref 70–99)
Potassium: 4 mEq/L (ref 3.7–5.3)
Sodium: 136 mEq/L — ABNORMAL LOW (ref 137–147)

## 2014-03-02 LAB — MAGNESIUM: Magnesium: 2 mg/dL (ref 1.5–2.5)

## 2014-03-02 MED ORDER — TIOTROPIUM BROMIDE MONOHYDRATE 18 MCG IN CAPS
18.0000 ug | ORAL_CAPSULE | Freq: Every day | RESPIRATORY_TRACT | Status: DC
  Administered 2014-03-03 – 2014-03-12 (×10): 18 ug via RESPIRATORY_TRACT
  Filled 2014-03-02 (×2): qty 5

## 2014-03-02 MED ORDER — LEVALBUTEROL HCL 0.63 MG/3ML IN NEBU: 0.6300 mg | INHALATION_SOLUTION | Freq: Four times a day (QID) | RESPIRATORY_TRACT | Status: DC | PRN

## 2014-03-02 MED ORDER — INSULIN ASPART 100 UNIT/ML ~~LOC~~ SOLN
0.0000 [IU] | Freq: Three times a day (TID) | SUBCUTANEOUS | Status: DC
Start: 2014-03-02 — End: 2014-03-12
  Administered 2014-03-02: 7 [IU] via SUBCUTANEOUS
  Administered 2014-03-03: 2 [IU] via SUBCUTANEOUS
  Administered 2014-03-03: 5 [IU] via SUBCUTANEOUS
  Administered 2014-03-03: 7 [IU] via SUBCUTANEOUS
  Administered 2014-03-03 – 2014-03-04 (×2): 3 [IU] via SUBCUTANEOUS
  Administered 2014-03-04: 2 [IU] via SUBCUTANEOUS
  Administered 2014-03-04: 5 [IU] via SUBCUTANEOUS
  Administered 2014-03-04: 7 [IU] via SUBCUTANEOUS
  Administered 2014-03-05: 2 [IU] via SUBCUTANEOUS
  Administered 2014-03-05: 3 [IU] via SUBCUTANEOUS
  Administered 2014-03-05: 5 [IU] via SUBCUTANEOUS
  Administered 2014-03-05: 3 [IU] via SUBCUTANEOUS
  Administered 2014-03-06: 2 [IU] via SUBCUTANEOUS
  Administered 2014-03-06 (×2): 5 [IU] via SUBCUTANEOUS
  Administered 2014-03-06 – 2014-03-07 (×4): 3 [IU] via SUBCUTANEOUS
  Administered 2014-03-07: 2 [IU] via SUBCUTANEOUS
  Administered 2014-03-08: 3 [IU] via SUBCUTANEOUS
  Administered 2014-03-08: 5 [IU] via SUBCUTANEOUS
  Administered 2014-03-08: 3 [IU] via SUBCUTANEOUS
  Administered 2014-03-08: 2 [IU] via SUBCUTANEOUS
  Administered 2014-03-09: 5 [IU] via SUBCUTANEOUS
  Administered 2014-03-09: 3 [IU] via SUBCUTANEOUS
  Administered 2014-03-09: 5 [IU] via SUBCUTANEOUS
  Administered 2014-03-09: 1 [IU] via SUBCUTANEOUS
  Administered 2014-03-10: 9 [IU] via SUBCUTANEOUS
  Administered 2014-03-10: 3 [IU] via SUBCUTANEOUS
  Administered 2014-03-10: 5 [IU] via SUBCUTANEOUS
  Administered 2014-03-10: 3 [IU] via SUBCUTANEOUS
  Administered 2014-03-11: 2 [IU] via SUBCUTANEOUS
  Administered 2014-03-11: 3 [IU] via SUBCUTANEOUS
  Administered 2014-03-11 – 2014-03-12 (×2): 2 [IU] via SUBCUTANEOUS
  Administered 2014-03-12: 3 [IU] via SUBCUTANEOUS

## 2014-03-02 MED ORDER — MOMETASONE FURO-FORMOTEROL FUM 200-5 MCG/ACT IN AERO
2.0000 | INHALATION_SPRAY | Freq: Two times a day (BID) | RESPIRATORY_TRACT | Status: DC
  Administered 2014-03-02 – 2014-03-05 (×6): 2 via RESPIRATORY_TRACT
  Filled 2014-03-02: qty 8.8

## 2014-03-02 MED ORDER — INSULIN ASPART 100 UNIT/ML ~~LOC~~ SOLN: 0.0000 [IU] | Freq: Three times a day (TID) | SUBCUTANEOUS | Status: DC

## 2014-03-02 NOTE — Telephone Encounter (Signed)
Follow Up  Pt returned Carol's call// Pt is in the hospital about to undergo heart surgery//  Please assist

## 2014-03-02 NOTE — Progress Notes (Signed)
Utilization review completed.  

## 2014-03-02 NOTE — Progress Notes (Signed)
Name: Paige Hardin MRN: [id number] DOB: 23-Jul-1946 LOS: 54 BURNETT,BRENT A, MD - pcp   PCCM PROGRESS NOTE  Brief admission history: The patient is 68 yo lady with PMH of COPD (dr clance) and dCHF with EF of 60% (2D echo on 01/15/14), who was admitted due to combiced COPD and CHF exacerbation on 02/24/14.  She was intubated and reated with steroids, abx, diureses. She was extubated on 4/1.   LINES / TUBES: OETT 3/31>>>4/1  CULTURES: UA 3/31>> negative for UTI UC 3/31>>negative  U. Strep Antigen 3/31>>negative BCx2 3/31>>neg ....Marland KitchenMarland KitchenMarland Kitchen resp virus panel 02/27/14>>4/4 negative  ANTIBIOTICS: Vanco 3/31 (empiric)>>4/1 Zosyn 3/31 (empiric)>>4/1 Levaquin 4/4 >> (4 days)    SIGNIFICANT EVENTS / STUDIES:   3/31 - worsening SOB at SNF despite multiple albuterol Rx, intubated in ER.  CXR c/w edema on admit 4/1- XCR: No pneumothorax. CHF with mild improvement since yesterday.  4/1- Extubated 4/2- Had worsening SOB and Afb-RVR, responded to extra dose of Lasix and IV Cardizem 4/2-CXR worsening pulmonary edema 4/3- CXR improving pulmonary edema 4/3- LE doppler negative for DVT 4/3- X-ray right Hip, no acute abnormality seen. 4/4- CXR Improving pulmonary edema with persistent cardiomegaly 4/4- started levaquin  SUBJECTIVE/OVERNIGHT/INTERVAL HX -no new issues, now in SDU apprec cards consult  Vital Signs: Filed Vitals:   03/02/14 1029  BP: 132/68  Pulse: 108  Temp:   Resp:    I/O last 3 completed shifts: In: 74 [P.O.:960; I.V.:170] Out: 5025 [Urine:5025]  Physical Examination:  General: No Acute disdress HEENT: PERRL, EOMI, no scleral icterus Cardiac: S1/S2, Irregular, No Murmurs, No gallops or rubs Pulm: scattered rhonchi Abd: Soft,  nondistended, nontender, no rebound pain, no organomegaly, BS present Ext: 1+ pitting leg edema bilaterally. Has right hip and upper leg pain posteriorly, which is better Neuro: alert and oriented X3, moves all extremeties, sensation to light  touch intact.  Skin: No rashes  PULMONARY  Recent Labs Lab 02/24/14 2312 02/25/14 0040 02/25/14 0430  PHART 7.220* 7.386 7.517*  PCO2ART 78.6* 43.3 31.7*  PO2ART 79.0* 91.3 72.7*  HCO3 32.5* 25.8* 25.7*  TCO2 35 27.2 26.7  O2SAT 93.0 97.5 96.6    CBC  Recent Labs Lab 02/28/14 0310 03/01/14 0256 03/02/14 0400  HGB 7.6* 8.0* 8.2*  HCT 24.9* 25.8* 27.0*  WBC 12.0* 11.6* 11.9*  PLT 254 264 309    COAGULATION  Recent Labs Lab 02/24/14 2220  INR 1.27    CARDIAC    Recent Labs Lab 02/24/14 2247 02/25/14 0205 02/25/14 1000 02/26/14 0800 02/27/14 1220  TROPONINI <0.30 <0.30 0.75* 0.39* <0.30    Recent Labs Lab 02/24/14 2220 02/27/14 0540 03/01/14 0256  PROBNP 1615.0* 3380.0* 5100.0*     CHEMISTRY  Recent Labs Lab 02/27/14 0045 02/27/14 2055 02/28/14 0310 03/01/14 0256 03/02/14 0400  NA 138 140 138 136* 136*  K 4.3 5.1 4.5 4.3 4.0  CL 97 97 95* 94* 93*  CO2 30 28 27 29 30   GLUCOSE 192* 307* 260* 215* 160*  BUN 28* 26* 25* 28* 27*  CREATININE 0.99 0.97 0.89 1.12* 1.07  CALCIUM 8.5 8.9 8.6 8.9 8.7  MG 2.0  --  2.0 2.0 2.0  PHOS  --   --   --  3.1 3.9   Estimated Creatinine Clearance: 70.4 ml/min (by C-G formula based on Cr of 1.07).   LIVER  Recent Labs Lab 02/24/14 2220  AST 20  ALT 16  ALKPHOS 96  BILITOT 0.7  PROT 7.3  ALBUMIN 3.3*  INR 1.27  INFECTIOUS  Recent Labs Lab 02/24/14 2236 02/27/14 2055  LATICACIDVEN 0.90  --   PROCALCITON  --  0.79     ENDOCRINE CBG (last 3)   Recent Labs  03/01/14 2332 03/02/14 0411 03/02/14 0754  GLUCAP 259* 154* 125*    IMAGING x48h  Dg Chest Port 1 View  03/01/2014   CLINICAL DATA:  COPD  EXAM: PORTABLE CHEST - 1 VIEW  COMPARISON:  02/28/2014  FINDINGS: Stable cardiomegaly with mild interstitial edema pattern and basilar atelectasis, worse on the left. No enlarging effusion or pneumothorax. Trachea midline. Atherosclerosis of the aorta.  IMPRESSION: Stable mild edema  pattern.   Electronically Signed   By: Daryll Brod M.D.   On: 03/01/2014 08:09    PULMONARY A: Acute Hypercarbic Respiratory Failure: resolved Pulmonary Edema: improving COPD: Blood and respiratory cultures: no growth. Respiratory virus panel negative. Wheezing improved  P:    - change to symbicort/spiriva -BD nebs prn - prednisone taper for total of 5 days - continue Levaquin total 4 days (see flows) - cont  Muccinex  CARDIOVASCULAR A:   CHF exacerbation (ECHO 12/2103 with EF 60% and mild LVH): Pro BNP 1615-->3380-->5100.  Afib with RVR: still needs IV Cardizem gtt. On both oral and IV Cardizem, and Lopressor now Elevated Trop: normalized on 02/27/14. No chest pain.   P:  - Continue Pradaxa - Continue IV Cardizem gtt, will titrate down as tolerated - continue oral Cardizem. - cont Scheduled lasix 40 mg iv bid - Lopressor: 12.5 mg bid. -cards to f/u with pt ? AV ablation plus PPM  RENAL A:    - Mg and K normal -P:   - Trend BMP - Address electrolytes as indicated.   GASTROINTESTINAL A:    GERD P:    -  Protonix 40 mg daily (was on lansoprazole 30 MG daily at home)  -  Carb modified diet  INFECTIOUS A:     slight high PCT, suggests possible localized baceterial source for AECOPD  P:  - levaquin po for total of 4 days frmo 02/28/14 (staff MD comment)  HEMEO A:    Anemia of critical illness P:   - PRBC for hgb </= 6.9gm%    - exceptions are   -  if ACS susepcted/confirmed then transfuse for hgb </= 8.0gm%,  or     active bleeding with hemodynamic instability, then transfuse regardless of hemoglobin value   At at all times try to transfuse 1 unit prbc as possible with exception of active hemorrhage  ENDOCRINE A:    DM-II: A1c 6.7.   P:  - SSI, resistant scale - Lantus: 5 U daily  NEUROLOGIC A:    Acute Encephalopathy: resolved  -normal exam 03/01/14 P:   - Monitor neurological exams  Mariel Sleet Beeper  [phone]  Cell  [phone]  If  no response or cell goes to voicemail, call beeper [phone]  03/02/2014 11:04 AM

## 2014-03-02 NOTE — Clinical Social Work Note (Signed)
Pt transferred from 70M to 3S.  Pt is from [facility] and has a bed hold.  70M CSW gave report to 3S CSW.  70M CSW signing off at this time.  Nonnie Done, [place] [phone]  Clinical Social Work

## 2014-03-02 NOTE — Telephone Encounter (Signed)
lmptcb to go over instructions for myoview. I then saw that pt currently admitted to [place].

## 2014-03-02 NOTE — Progress Notes (Signed)
Physical Therapy Treatment Patient Details Name: Paige Hardin MRN: [id number] DOB: February 18, 1946 Today's Date: 03/02/2014    History of Present Illness The patient is 68 yo lady with PMH of COPD and dCHF with EF of 60% (2D echo on 01/15/14), who was admitted due to combined COPD and CHF exacerbation on 02/24/14. Pt intubated 3/31 and extubated 4/1    PT Comments    Pt is progressing well with OOB mobility today and improved respiratory tolerance of mobility today compared to last session.  Should try to progress to in-room ambulation next session.  She will need a wide RW if one is not already in room. PT will continue to follow acutely.     Follow Up Recommendations  SNF     Equipment Recommendations  None recommended by PT    Recommendations for Other Services   None     Precautions / Restrictions Precautions Precautions: Other (comment) Precaution Comments: limited respiratory tolerance to activity (this is at baseline as well).      Mobility  Bed Mobility Overal bed mobility: Modified Independent             General bed mobility comments: HOB elevated and pt using railing for leverage, but got to EOB without therapist's assist.   Transfers Overall transfer level: Needs assistance Equipment used: 1 person hand held assist Transfers: Sit to/from Stand;Stand Pivot Transfers Sit to Stand: Min assist Stand pivot transfers: Min assist       General transfer comment: min hand held assist to stabilize for balance.  PT tech was getting bari RW for pt to use, but we were ready to get to chair.  Pt did not report being ready to attempt gait yet.      Balance Overall balance assessment: Needs assistance Sitting-balance support: No upper extremity supported;Feet supported Sitting balance-Leahy Scale: Good     Standing balance support: Single extremity supported Standing balance-Leahy Scale: Poor                      Cognition Arousal/Alertness:  Awake/alert Behavior During Therapy: WFL for tasks assessed/performed Overall Cognitive Status: Within Functional Limits for tasks assessed                      Exercises General Exercises - Lower Extremity Long Arc Quad: AROM;Both;10 reps;Seated Hip ABduction/ADduction: AROM;Both;10 reps;Seated (adduction only against pillow for resistance) Hip Flexion/Marching: AROM;Both;10 reps;Seated (this is an improvement in strength right leg ) Toe Raises: AROM;Both;10 reps;Seated Heel Raises: AROM;Both;10 reps;Seated Other Exercises Other Exercises: HEP paper given with above listed exercises.  Encouraged pt to do them again while sitting up.  She was also encouraged to slow down the reps to make it a bit harder.     General Comments General comments (skin integrity, edema, etc.): Per pt, she had gotten to the point at SNF for rehab that she was not using O2, but for two weeks before this episode happened, she had to go back on it.        Pertinent Vitals/Pain  03/02/14 1321  Vital Signs  Pulse Rate ! 103  Resp ! 24  Pain Assessment  Pain Assessment No/denies pain  Pain Score 0  Oxygen Therapy  SpO2 97 %  O2 Device Nasal cannula  O2 Flow Rate (L/min) 3.5 L/min              PT Goals (current goals can now be found in the care plan section) Acute Rehab  PT Goals Patient Stated Goal: to get stronger and go to "rest home" Progress towards PT goals: Progressing toward goals    Frequency  Min 2X/week    PT Plan Current plan remains appropriate       End of Session Equipment Utilized During Treatment: Oxygen Activity Tolerance: Patient limited by fatigue;Treatment limited secondary to medical complications (Comment) (limited by poor repiratory tolerance to activity) Patient left: in chair;with call bell/phone within reach;with family/visitor present     Time: 5852-7782 PT Time Calculation (min): 15 min  Charges:  $Therapeutic Exercise: 8-22 mins                       Lorn Butcher B. Manning, [place], DPT [phone]   03/02/2014, 4:42 PM

## 2014-03-03 ENCOUNTER — Encounter (HOSPITAL_COMMUNITY): Payer: Self-pay

## 2014-03-03 LAB — BASIC METABOLIC PANEL
BUN: 26 mg/dL — ABNORMAL HIGH (ref 6–23)
CHLORIDE: 94 meq/L — AB (ref 96–112)
CO2: 31 meq/L (ref 19–32)
Calcium: 8.6 mg/dL (ref 8.4–10.5)
Creatinine, Ser: 1.04 mg/dL (ref 0.50–1.10)
GFR calc Af Amer: 63 mL/min — ABNORMAL LOW (ref >90)
GFR calc non Af Amer: 54 mL/min — ABNORMAL LOW (ref >90)
GLUCOSE: 133 mg/dL — AB (ref 70–99)
Potassium: 3.5 mEq/L — ABNORMAL LOW (ref 3.7–5.3)
SODIUM: 139 meq/L (ref 137–147)

## 2014-03-03 LAB — CULTURE, BLOOD (ROUTINE X 2)
Culture: NO GROWTH
Culture: NO GROWTH

## 2014-03-03 LAB — GLUCOSE, CAPILLARY
GLUCOSE-CAPILLARY: 129 mg/dL — AB (ref 70–99)
GLUCOSE-CAPILLARY: 243 mg/dL — AB (ref 70–99)
GLUCOSE-CAPILLARY: 275 mg/dL — AB (ref 70–99)
Glucose-Capillary: 172 mg/dL — ABNORMAL HIGH (ref 70–99)

## 2014-03-03 MED ORDER — POTASSIUM CHLORIDE CRYS ER 20 MEQ PO TBCR
40.0000 meq | EXTENDED_RELEASE_TABLET | Freq: Once | ORAL | Status: AC
  Administered 2014-03-03: 40 meq via ORAL
  Filled 2014-03-03: qty 2

## 2014-03-03 MED ORDER — METOPROLOL TARTRATE 25 MG PO TABS
25.0000 mg | ORAL_TABLET | Freq: Two times a day (BID) | ORAL | Status: DC
  Administered 2014-03-03 – 2014-03-07 (×8): 25 mg via ORAL
  Filled 2014-03-03 (×9): qty 1

## 2014-03-03 NOTE — Progress Notes (Signed)
Inpatient Diabetes Program Recommendations  AACE/ADA: New Consensus Statement on Inpatient Glycemic Control (2013)  Target Ranges:  Prepandial:   less than 140 mg/dL      Peak postprandial:   less than 180 mg/dL (1-2 hours)      Critically ill patients:  140 - 180 mg/dL   Reason for Visit: Hyperglycemia  Diabetes history: Type 2 diabetes Outpatient Diabetes medications: Glynase 3 mg daily at SNF Current orders for Inpatient glycemic control: Lantus 5 units at HS, Sensitive correction novolog tid  Results for Paige Hardin, Paige Hardin (MRN [id number]) as of 03/03/2014 14:12  Ref. Range 03/02/2014 07:54 03/02/2014 11:28 03/02/2014 16:28 03/02/2014 21:03 03/03/2014 07:55  Glucose-Capillary Latest Range: 70-99 mg/dL 125 (H) 238 (H) 314 (H) 323 (H) 129 (H)   Note:  CBG's indicate need for meal coverage until patient resumes OHA at SNF.  Request MD consider starting Novolog 3 units tid with meals-- to be given as meal coverage and held if CBG < 80 mg/dl, or patient eats less than 50% of meal.  Thank you.  Neeva Trew S. Marcelline Mates, RN, CNS, CDE Inpatient Diabetes Program, team pager [phone]

## 2014-03-03 NOTE — Progress Notes (Signed)
Patient ID: SHAMINA ETHERIDGE, female   DOB: 03-Sep-1946, 68 y.o.   MRN: [id number]   Patient Name: Paige Hardin Date of Encounter: 03/03/2014     Active Problems:   HYPERLIPIDEMIA-MIXED   COPD with emphysema    CAROTID BRUIT   PAF (paroxysmal atrial fibrillation)   Acute respiratory failure   Acute on chronic diastolic congestive heart failure   HTN (hypertension)   Chronic anticoagulation   Pulmonary HTN- pa 53 mmHg Echo 01/15/14   Morbid obesity- BMI 50   Poorly controlled diabetes mellitus   Chronic renal disease, stage III   Atrial fibrillation with RVR   COPD exacerbation   Hypoxemia   Prolonged Q-T interval on ECG    SUBJECTIVE Dyspnea improved. Overall better over the past 48 hours with ventricular rate under improved control.  CURRENT MEDS . antiseptic oral rinse  15 mL Mouth Rinse BID  . dabigatran  150 mg Oral Q12H  . dextromethorphan-guaiFENesin  1 tablet Oral BID  . diltiazem  60 mg Oral 4 times per day  . docusate sodium  100 mg Oral BID  . furosemide  40 mg Intravenous Q12H  . insulin aspart  0-9 Units Subcutaneous TID AC & HS  . insulin glargine  5 Units Subcutaneous QHS  . metoprolol tartrate  25 mg Oral BID  . mometasone-formoterol  2 puff Inhalation BID  . pantoprazole  40 mg Oral Q1200  . tiotropium  18 mcg Inhalation Daily    OBJECTIVE  Filed Vitals:   03/03/14 0535 03/03/14 0757 03/03/14 0941 03/03/14 1149  BP:  138/93  127/55  Pulse:  118  102  Temp:  98.3 F (36.8 C)  97.7 F (36.5 C)  TempSrc:  Oral  Oral  Resp:  20  18  Height:      Weight:      SpO2: 98% 94% 96% 98%    Intake/Output Summary (Last 24 hours) at 03/03/14 1305 Last data filed at 03/03/14 0800  Gross per 24 hour  Intake    815 ml  Output   1800 ml  Net   -985 ml   Filed Weights   02/26/14 0400 02/27/14 0208 02/28/14 0600  Weight: 308 lb 10.3 oz (140 kg) 303 lb 12.7 oz (137.8 kg) 299 lb 13.2 oz (136 kg)    PHYSICAL EXAM  General: Pleasant, obese, 68 yo woman,  NAD. Neuro: Alert and oriented X 3. Moves all extremities spontaneously. HEENT:  Normal  Neck: Supple without bruits or JVD. Lungs:  Resp regular and unlabored, CTA. Heart: RRR no s3, s4, or murmurs. Abdomen: Soft, non-tender, non-distended, BS + x 4.  Extremities: No clubbing, cyanosis or edema. DP/PT/Radials 2+ and equal bilaterally.  Accessory Clinical Findings  CBC  Recent Labs  03/01/14 0256 03/02/14 0400  WBC 11.6* 11.9*  NEUTROABS 9.9* 9.8*  HGB 8.0* 8.2*  HCT 25.8* 27.0*  MCV 89.9 89.4  PLT 264 469   Basic Metabolic Panel  Recent Labs  03/01/14 0256 03/02/14 0400 03/03/14 0335  NA 136* 136* 139  K 4.3 4.0 3.5*  CL 94* 93* 94*  CO2 29 30 31   GLUCOSE 215* 160* 133*  BUN 28* 27* 26*  CREATININE 1.12* 1.07 1.04  CALCIUM 8.9 8.7 8.6  MG 2.0 2.0  --   PHOS 3.1 3.9  --    Liver Function Tests No results found for this basename: AST, ALT, ALKPHOS, BILITOT, PROT, ALBUMIN,  in the last 72 hours No results found for this basename:  LIPASE, AMYLASE,  in the last 72 hours Cardiac Enzymes No results found for this basename: CKTOTAL, CKMB, CKMBINDEX, TROPONINI,  in the last 72 hours BNP No components found with this basename: POCBNP,  D-Dimer No results found for this basename: DDIMER,  in the last 72 hours Hemoglobin A1C No results found for this basename: HGBA1C,  in the last 72 hours Fasting Lipid Panel No results found for this basename: CHOL, HDL, LDLCALC, TRIG, CHOLHDL, LDLDIRECT,  in the last 72 hours Thyroid Function Tests No results found for this basename: TSH, T4TOTAL, FREET3, T3FREE, THYROIDAB,  in the last 72 hours  TELE Atrial fib with a CVR/RVR   Radiology/Studies  Dg Chest Port 1 View  03/01/2014   CLINICAL DATA:  COPD  EXAM: PORTABLE CHEST - 1 VIEW  COMPARISON:  02/28/2014  FINDINGS: Stable cardiomegaly with mild interstitial edema pattern and basilar atelectasis, worse on the left. No enlarging effusion or pneumothorax. Trachea midline.  Atherosclerosis of the aorta.  IMPRESSION: Stable mild edema pattern.   Electronically Signed   By: Daryll Brod M.D.   On: 03/01/2014 08:09   Dg Chest Port 1 View  02/28/2014   CLINICAL DATA:  Respiratory failure.  EXAM: PORTABLE CHEST - 1 VIEW  COMPARISON:  DG CHEST 1V PORT dated 02/27/2014; DG CHEST 1V PORT dated 02/26/2014; DG CHEST 1V PORT dated 01/14/2014  FINDINGS: 0547 hr. There is stable cardiomegaly and aortic atherosclerosis. Interstitial edema has improved over the last 2 days. There is no confluent airspace opacity, significant pleural effusion or pneumothorax. The osseous structures appear unchanged for  IMPRESSION: Improving pulmonary edema with persistent cardiomegaly and residual vascular congestion.   Electronically Signed   By: Camie Patience M.D.   On: 02/28/2014 07:50   Dg Chest Port 1 View  02/27/2014   CLINICAL DATA:  Evaluate CHF.  EXAM: PORTABLE CHEST - 1 VIEW  COMPARISON:  DG CHEST 1V PORT dated 02/26/2014  FINDINGS: The cardiac silhouette remains moderately enlarged, mildly calcified aortic knob. Slightly decreased interstitial prominence and central pulmonary vasculature congestion. No pleural effusions or focal consolidations. No pneumothorax.  Multiple EKG lines overlie the patient and may obscure subtle underlying pathology. Soft tissue planes and included osseous structures are nonsuspicious, mild degenerative change of thoracic spine.  IMPRESSION: Stable cardiomegaly with decreased interstitial pulmonary edema. No definite pleural effusions on today's examination.   Electronically Signed   By: Elon Alas   On: 02/27/2014 05:52   Dg Chest Port 1 View  02/26/2014   CLINICAL DATA:  COPD, emphysema, shortness of Breath  EXAM: PORTABLE CHEST - 1 VIEW  COMPARISON:  02/25/2014  FINDINGS: Cardiomegaly is noted. Endotracheal and NG tube has been removed. There is mild interstitial prominence bilaterally with slight worsening in aeration. Findings highly suspicious for worsening  congestive heart failure. Question small bilateral pleural effusion.  IMPRESSION: Endotracheal and NG tube has been removed. There is mild interstitial prominence bilaterally with slight worsening in aeration. Findings highly suspicious for worsening congestive heart failure. Question small bilateral pleural effusion.   Electronically Signed   By: Lahoma Crocker M.D.   On: 02/26/2014 18:53   Dg Chest Port 1 View  02/25/2014   CLINICAL DATA:  Central line insertion  EXAM: PORTABLE CHEST - 1 VIEW  COMPARISON:  Chest x-ray from yesterday  FINDINGS: Endotracheal tube ends in the mid to lower thoracic trachea. New left IJ catheter, tip at the distal SVC. Orogastric tube continues to cross the diaphragm. Unchanged cardiopericardial enlargement. No evidence of  pneumothorax. Worsening aeration at the right base which likely reflects swelling pleural fluid. Pulmonary edema which appears improved from earlier.  IMPRESSION: 1. A new left IJ catheter is in good position.  No pneumothorax. 2. CHF with mild improvement since yesterday.   Electronically Signed   By: Jorje Guild M.D.   On: 02/25/2014 03:07   Dg Chest Portable 1 View  02/24/2014   CLINICAL DATA:  Respiratory distress.  EXAM: PORTABLE CHEST - 1 VIEW  COMPARISON:  01/26/2014  FINDINGS: Endotracheal tube ends in the lower thoracic trachea, but above the carina (which is difficult visualize) likely by 2 or 3 cm. Enteric tube crosses the diaphragm.  Chronic cardiopericardial enlargement. Diffuse interstitial coarsening with Kerley B-lines. Trace bilateral pleural effusion. Perihilar airspace opacity. No evidence of pneumothorax.  IMPRESSION: 1. Endotracheal and orogastric tubes are in good position. 2. CHF.   Electronically Signed   By: Jorje Guild M.D.   On: 02/24/2014 22:42   Dg Hip Portable 1 View Right  02/27/2014   CLINICAL DATA:  Right hip pain  EXAM: PORTABLE RIGHT HIP - 1 VIEW  COMPARISON:  None.  FINDINGS: Single frontal view the right hip was  obtained and reveals no acute fracture dislocation. No gross soft tissue abnormality is noted.  IMPRESSION: No acute abnormality seen.   Electronically Signed   By: Inez Catalina M.D.   On: 02/27/2014 14:56    ASSESSMENT AND PLAN 1. atrial fib with a RVR 2. Respiratory failure - multi-factorial, improving Rec: will/would uptitrate her beta blockers carefully. Would transition IV cardizem off and transition short acting po cardizem to long acting. I suspect she will ultimately require 360 mg a day of long acting cardizem and 50-100 mg daily of metoprolol.  Gregg Taylor,M.D.  03/03/2014 1:05 PM

## 2014-03-03 NOTE — Progress Notes (Addendum)
PULMONARY / CRITICAL CARE MEDICINE  Name: LATASHIA KOCH MRN: [id number] DOB: 1946-08-26  BRIEF PATIENT DESCRIPTION: 68 yo with COPD ( [place] ) and dCHF admitted 3/31 with acute on chronic respiratory failure secondary to COPD and CHF exacerbation on 02/24/14.  SIGNIFICANT EVENTS / STUDIES:   4/3  LE venous Doppler >>> neg  LINES / TUBES: OETT 3/31 >>> 4/1  CULTURES: 3/31  Urine >>> neg 3/31  Blood >>> neg  ANTIBIOTICS: Vanco 3/31 >>> 4/1 Zosyn 3/31 >>> 4/1 Levaquin 4/4 >>>   INTERVAL HISTORY:  No acute events.  VITAL SIGNS: Temp:  [97.7 F (36.5 C)-99.1 F (37.3 C)] 97.7 F (36.5 C) (04/07 1149) Pulse Rate:  [93-118] 102 (04/07 1149) Resp:  [12-24] 18 (04/07 1149) BP: (111-138)/(53-93) 127/55 mmHg (04/07 1149) SpO2:  [82 %-99 %] 98 % (04/07 1149)  PHYSICAL EXAMINATION: General: Resting comfortable, no distress HEENT: Moist membranes  Cardiac: Irregular, no murmurs Pulm: Bilateral diminished air entry, no added sopunds Abd: Obese, soft Ext: Pitting edema LE bilaterally Neuro: Awake, alert Skin: No rash  LABS: CBC  Recent Labs Lab 02/28/14 0310 03/01/14 0256 03/02/14 0400  WBC 12.0* 11.6* 11.9*  HGB 7.6* 8.0* 8.2*  HCT 24.9* 25.8* 27.0*  PLT 254 264 309   Coag's  Recent Labs Lab 02/24/14 2220  INR 1.27   BMET  Recent Labs Lab 03/01/14 0256 03/02/14 0400 03/03/14 0335  NA 136* 136* 139  K 4.3 4.0 3.5*  CL 94* 93* 94*  CO2 29 30 31   BUN 28* 27* 26*  CREATININE 1.12* 1.07 1.04  GLUCOSE 215* 160* 133*   Electrolytes  Recent Labs Lab 02/28/14 0310 03/01/14 0256 03/02/14 0400 03/03/14 0335  CALCIUM 8.6 8.9 8.7 8.6  MG 2.0 2.0 2.0  --   PHOS  --  3.1 3.9  --    Sepsis Markers  Recent Labs Lab 02/24/14 2236 02/27/14 2055  LATICACIDVEN 0.90  --   PROCALCITON  --  0.79   ABG  Recent Labs Lab 02/24/14 2312 02/25/14 0040 02/25/14 0430  PHART 7.220* 7.386 7.517*  PCO2ART 78.6* 43.3 31.7*  PO2ART 79.0* 91.3 72.7*   Liver  Enzymes  Recent Labs Lab 02/24/14 2220  AST 20  ALT 16  ALKPHOS 96  BILITOT 0.7  ALBUMIN 3.3*   Cardiac Enzymes  Recent Labs Lab 02/24/14 2220  02/25/14 1000 02/26/14 0800 02/27/14 0540 02/27/14 1220 03/01/14 0256  TROPONINI  --   < > 0.75* 0.39*  --  <0.30  --   PROBNP 1615.0*  --   --   --  3380.0*  --  5100.0*  < > = values in this interval not displayed. Glucose  Recent Labs Lab 03/02/14 0411 03/02/14 0754 03/02/14 1128 03/02/14 1628 03/02/14 2103 03/03/14 0755  GLUCAP 154* 125* 238* 314* 323* 129*   IMAGING: No results found.  ASSESSMENT AND PLAN:  Acute on chronic respiratory failure  Supplemental oxygen for goal SpO2>92  Chronic obstructive pulmonary disease with exacerbation  Xopenex, Dulera, Spiriva   Levaquin  Prednisone taper  Suspect OSA / OHS  May benefit form polysomnography as outpatient  Acute on chronic diastolic heart failure Acute pulmonary edema  Lasix  AF / RVR  Cardiology following, input appreciated  Cardizem gtt, titrate to off  Cardizem  Metoprolol  Pradaxa  Hypokalemia  K 40  GERD  Protonix  Anemia of chronic disease  DM Hyperglycemia  SSI  Lantus 5  I have personally obtained history, examined patient, evaluated and interpreted laboratory  and imaging results, reviewed medical records, formulated assessment / plan and placed orders.  Doree Fudge, MD Pulmonary and [place] Pager: [phone]  03/03/2014, 1:58 PM

## 2014-03-03 NOTE — Clinical Social Work Note (Signed)
CSW received handoff from previous CSW. CSW notes that patient is from [facility] and family is holding bed for patient. CSW will assist with DC back to SNF once appropriate.  Liz Beach MSW, [place], [place], [phone]

## 2014-03-04 LAB — GLUCOSE, CAPILLARY
Glucose-Capillary: 164 mg/dL — ABNORMAL HIGH (ref 70–99)
Glucose-Capillary: 307 mg/dL — ABNORMAL HIGH (ref 70–99)
Glucose-Capillary: 239 mg/dL — ABNORMAL HIGH (ref 70–99)

## 2014-03-04 NOTE — Progress Notes (Signed)
Inpatient Diabetes Program Recommendations  AACE/ADA: New Consensus Statement on Inpatient Glycemic Control (2013)  Target Ranges:  Prepandial:   less than 140 mg/dL      Peak postprandial:   less than 180 mg/dL (1-2 hours)      Critically ill patients:  140 - 180 mg/dL    Note:  CBG's indicate need for meal coverage until patient resumes OHA at SNF. Request MD consider starting Novolog 3 units tid with meals-- to be given as meal coverage and held if CBG < 80 mg/dl, or patient eats less than 50% of meal. Thank you. Terria Deschepper S. Marcelline Mates, RN, CNS, CDE Inpatient Diabetes Program, team pager [phone]

## 2014-03-04 NOTE — Progress Notes (Signed)
Patient ID: YOCELIN VANLUE, female   DOB: Apr 22, 1946, 68 y.o.   MRN: [id number]    Patient Name: Paige Hardin Date of Encounter: 03/04/2014     Active Problems:   HYPERLIPIDEMIA-MIXED   COPD with emphysema    CAROTID BRUIT   PAF (paroxysmal atrial fibrillation)   Acute respiratory failure   Acute on chronic diastolic congestive heart failure   HTN (hypertension)   Chronic anticoagulation   Pulmonary HTN- pa 53 mmHg Echo 01/15/14   Morbid obesity- BMI 50   Poorly controlled diabetes mellitus   Chronic renal disease, stage III   Atrial fibrillation with RVR   COPD exacerbation   Hypoxemia   Prolonged Q-T interval on ECG    SUBJECTIVE Dyspnea improved. Overall better over the past 48 hours but ventricular rate still not well controlled. She is not inclined to pursue AV node ablation.   CURRENT MEDS . antiseptic oral rinse  15 mL Mouth Rinse BID  . dabigatran  150 mg Oral Q12H  . dextromethorphan-guaiFENesin  1 tablet Oral BID  . diltiazem  60 mg Oral 4 times per day  . docusate sodium  100 mg Oral BID  . furosemide  40 mg Intravenous Q12H  . insulin aspart  0-9 Units Subcutaneous TID AC & HS  . insulin glargine  5 Units Subcutaneous QHS  . metoprolol tartrate  25 mg Oral BID  . mometasone-formoterol  2 puff Inhalation BID  . pantoprazole  40 mg Oral Q1200  . tiotropium  18 mcg Inhalation Daily    OBJECTIVE  Filed Vitals:   03/04/14 0621 03/04/14 0700 03/04/14 0744 03/04/14 0800  BP: 119/60 122/46  122/68  Pulse:  111  111  Temp:  98.9 F (37.2 C)  98.6 F (37 C)  TempSrc:  Oral  Oral  Resp:  14  14  Height:      Weight:      SpO2:  97% 96% 98%    Intake/Output Summary (Last 24 hours) at 03/04/14 0943 Last data filed at 03/04/14 0902  Gross per 24 hour  Intake    180 ml  Output   3690 ml  Net  -3510 ml   Filed Weights   02/26/14 0400 02/27/14 0208 02/28/14 0600  Weight: 308 lb 10.3 oz (140 kg) 303 lb 12.7 oz (137.8 kg) 299 lb 13.2 oz (136 kg)    PHYSICAL  EXAM  General: Pleasant, obese, 68 yo woman, NAD. Neuro: Alert and oriented X 3. Moves all extremities spontaneously. HEENT:  Normal  Neck: Supple without bruits or JVD. Lungs:  Resp regular and unlabored, CTA. Heart: IRRR no s3, s4, or murmurs. Abdomen: Soft, non-tender, non-distended, BS + x 4.  Extremities: No clubbing, cyanosis or edema. DP/PT/Radials 2+ and equal bilaterally.  Accessory Clinical Findings  CBC  Recent Labs  03/02/14 0400  WBC 11.9*  NEUTROABS 9.8*  HGB 8.2*  HCT 27.0*  MCV 89.4  PLT 956   Basic Metabolic Panel  Recent Labs  03/02/14 0400 03/03/14 0335  NA 136* 139  K 4.0 3.5*  CL 93* 94*  CO2 30 31  GLUCOSE 160* 133*  BUN 27* 26*  CREATININE 1.07 1.04  CALCIUM 8.7 8.6  MG 2.0  --   PHOS 3.9  --    Liver Function Tests No results found for this basename: AST, ALT, ALKPHOS, BILITOT, PROT, ALBUMIN,  in the last 72 hours No results found for this basename: LIPASE, AMYLASE,  in the last 72 hours Cardiac  Enzymes No results found for this basename: CKTOTAL, CKMB, CKMBINDEX, TROPONINI,  in the last 72 hours BNP No components found with this basename: POCBNP,  D-Dimer No results found for this basename: DDIMER,  in the last 72 hours Hemoglobin A1C No results found for this basename: HGBA1C,  in the last 72 hours Fasting Lipid Panel No results found for this basename: CHOL, HDL, LDLCALC, TRIG, CHOLHDL, LDLDIRECT,  in the last 72 hours Thyroid Function Tests No results found for this basename: TSH, T4TOTAL, FREET3, T3FREE, THYROIDAB,  in the last 72 hours  TELE Atrial fib with a CVR/RVR   Radiology/Studies  Dg Chest Port 1 View  03/01/2014   CLINICAL DATA:  COPD  EXAM: PORTABLE CHEST - 1 VIEW  COMPARISON:  02/28/2014  FINDINGS: Stable cardiomegaly with mild interstitial edema pattern and basilar atelectasis, worse on the left. No enlarging effusion or pneumothorax. Trachea midline. Atherosclerosis of the aorta.  IMPRESSION: Stable mild edema  pattern.   Electronically Signed   By: Daryll Brod M.D.   On: 03/01/2014 08:09   Dg Chest Port 1 View  02/28/2014   CLINICAL DATA:  Respiratory failure.  EXAM: PORTABLE CHEST - 1 VIEW  COMPARISON:  DG CHEST 1V PORT dated 02/27/2014; DG CHEST 1V PORT dated 02/26/2014; DG CHEST 1V PORT dated 01/14/2014  FINDINGS: 0547 hr. There is stable cardiomegaly and aortic atherosclerosis. Interstitial edema has improved over the last 2 days. There is no confluent airspace opacity, significant pleural effusion or pneumothorax. The osseous structures appear unchanged for  IMPRESSION: Improving pulmonary edema with persistent cardiomegaly and residual vascular congestion.   Electronically Signed   By: Camie Patience M.D.   On: 02/28/2014 07:50   Dg Chest Port 1 View  02/27/2014   CLINICAL DATA:  Evaluate CHF.  EXAM: PORTABLE CHEST - 1 VIEW  COMPARISON:  DG CHEST 1V PORT dated 02/26/2014  FINDINGS: The cardiac silhouette remains moderately enlarged, mildly calcified aortic knob. Slightly decreased interstitial prominence and central pulmonary vasculature congestion. No pleural effusions or focal consolidations. No pneumothorax.  Multiple EKG lines overlie the patient and may obscure subtle underlying pathology. Soft tissue planes and included osseous structures are nonsuspicious, mild degenerative change of thoracic spine.  IMPRESSION: Stable cardiomegaly with decreased interstitial pulmonary edema. No definite pleural effusions on today's examination.   Electronically Signed   By: Elon Alas   On: 02/27/2014 05:52   Dg Chest Port 1 View  02/26/2014   CLINICAL DATA:  COPD, emphysema, shortness of Breath  EXAM: PORTABLE CHEST - 1 VIEW  COMPARISON:  02/25/2014  FINDINGS: Cardiomegaly is noted. Endotracheal and NG tube has been removed. There is mild interstitial prominence bilaterally with slight worsening in aeration. Findings highly suspicious for worsening congestive heart failure. Question small bilateral pleural effusion.   IMPRESSION: Endotracheal and NG tube has been removed. There is mild interstitial prominence bilaterally with slight worsening in aeration. Findings highly suspicious for worsening congestive heart failure. Question small bilateral pleural effusion.   Electronically Signed   By: Lahoma Crocker M.D.   On: 02/26/2014 18:53   Dg Chest Port 1 View  02/25/2014   CLINICAL DATA:  Central line insertion  EXAM: PORTABLE CHEST - 1 VIEW  COMPARISON:  Chest x-ray from yesterday  FINDINGS: Endotracheal tube ends in the mid to lower thoracic trachea. New left IJ catheter, tip at the distal SVC. Orogastric tube continues to cross the diaphragm. Unchanged cardiopericardial enlargement. No evidence of pneumothorax. Worsening aeration at the right base which likely  reflects swelling pleural fluid. Pulmonary edema which appears improved from earlier.  IMPRESSION: 1. A new left IJ catheter is in good position.  No pneumothorax. 2. CHF with mild improvement since yesterday.   Electronically Signed   By: Jorje Guild M.D.   On: 02/25/2014 03:07   Dg Chest Portable 1 View  02/24/2014   CLINICAL DATA:  Respiratory distress.  EXAM: PORTABLE CHEST - 1 VIEW  COMPARISON:  01/26/2014  FINDINGS: Endotracheal tube ends in the lower thoracic trachea, but above the carina (which is difficult visualize) likely by 2 or 3 cm. Enteric tube crosses the diaphragm.  Chronic cardiopericardial enlargement. Diffuse interstitial coarsening with Kerley B-lines. Trace bilateral pleural effusion. Perihilar airspace opacity. No evidence of pneumothorax.  IMPRESSION: 1. Endotracheal and orogastric tubes are in good position. 2. CHF.   Electronically Signed   By: Jorje Guild M.D.   On: 02/24/2014 22:42   Dg Hip Portable 1 View Right  02/27/2014   CLINICAL DATA:  Right hip pain  EXAM: PORTABLE RIGHT HIP - 1 VIEW  COMPARISON:  None.  FINDINGS: Single frontal view the right hip was obtained and reveals no acute fracture dislocation. No gross soft tissue  abnormality is noted.  IMPRESSION: No acute abnormality seen.   Electronically Signed   By: Inez Catalina M.D.   On: 02/27/2014 14:56    ASSESSMENT AND PLAN 1. atrial fib with a RVR 2. Respiratory failure - multi-factorial, improving Rec: will/would uptitrate her beta blockers carefully. Would transition IV cardizem off and transition short acting po cardizem to long acting. I suspect she will ultimately require 360 mg a day of long acting cardizem and 50-100 mg daily of metoprolol. At this point, I would attempt to slowly uptitrate her beta blocker as pulmonary function allows. Would increase ambulation/activity. Please call for additional questions. The only other option we have for rate control is AV node ablation/PPM. Hopefully this can be avoided.   Gregg Taylor,M.D.  03/04/2014 9:43 AM

## 2014-03-05 LAB — GLUCOSE, CAPILLARY
GLUCOSE-CAPILLARY: 243 mg/dL — AB (ref 70–99)
GLUCOSE-CAPILLARY: 239 mg/dL — AB (ref 70–99)
GLUCOSE-CAPILLARY: 224 mg/dL — AB (ref 70–99)
Glucose-Capillary: 167 mg/dL — ABNORMAL HIGH (ref 70–99)
Glucose-Capillary: 261 mg/dL — ABNORMAL HIGH (ref 70–99)

## 2014-03-05 MED ORDER — POTASSIUM CHLORIDE CRYS ER 20 MEQ PO TBCR
40.0000 meq | EXTENDED_RELEASE_TABLET | Freq: Every day | ORAL | Status: AC
  Administered 2014-03-05 – 2014-03-06 (×2): 40 meq via ORAL
  Filled 2014-03-05 (×2): qty 2

## 2014-03-05 MED ORDER — FUROSEMIDE 10 MG/ML IJ SOLN
INTRAMUSCULAR | Status: AC
  Filled 2014-03-05: qty 4

## 2014-03-05 MED ORDER — FUROSEMIDE 10 MG/ML IJ SOLN
40.0000 mg | Freq: Once | INTRAMUSCULAR | Status: AC
  Administered 2014-03-05: 40 mg via INTRAVENOUS

## 2014-03-05 MED ORDER — BUDESONIDE-FORMOTEROL FUMARATE 80-4.5 MCG/ACT IN AERO
2.0000 | INHALATION_SPRAY | Freq: Two times a day (BID) | RESPIRATORY_TRACT | Status: DC
  Administered 2014-03-05 – 2014-03-12 (×12): 2 via RESPIRATORY_TRACT
  Filled 2014-03-05 (×2): qty 6.9

## 2014-03-05 NOTE — Progress Notes (Signed)
Utilization review completed.  

## 2014-03-05 NOTE — Progress Notes (Signed)
PULMONARY / CRITICAL CARE MEDICINE  Name: Paige Hardin MRN: [id number] DOB: 03/26/1946  BRIEF PATIENT DESCRIPTION: 68 yo with COPD ( [place] ) and dCHF admitted 3/31 with acute on chronic respiratory failure secondary to COPD and CHF exacerbation on 02/24/14.  SIGNIFICANT EVENTS / STUDIES:   4/3  LE venous Doppler >>> neg  LINES / TUBES: OETT 3/31 >>> 4/1  CULTURES: 3/31  Urine >>> neg 3/31  Blood >>> neg  ANTIBIOTICS: Vanco 3/31 >>> 4/1 Zosyn 3/31 >>> 4/1 Levaquin 4/4 >>>   INTERVAL HISTORY:  No acute events.  VITAL SIGNS: Temp:  [97.9 F (36.6 C)-98.3 F (36.8 C)] 97.9 F (36.6 C) (04/09 0700) Pulse Rate:  [59-109] 96 (04/09 0756) Resp:  [11-24] 21 (04/09 0756) BP: (92-130)/(31-75) 120/54 mmHg (04/09 0800) SpO2:  [95 %-100 %] 96 % (04/09 0756) FiO2 (%):  [2 %] 2 % (04/09 0800) Weight:  [128.9 kg (284 lb 2.8 oz)] 128.9 kg (284 lb 2.8 oz) (04/09 0600)  PHYSICAL EXAMINATION: General:no acute distress HEENT: NCAT, OP clear PULM: few crackles in bases CV: Irreg irreg, tachy at times, no murmur AB: BS+, soft, nontedner Ext: warm, chronic pretibial edema Neuro: A&Ox4, MAEW  LABS: CBC  Recent Labs Lab 02/28/14 0310 03/01/14 0256 03/02/14 0400  WBC 12.0* 11.6* 11.9*  HGB 7.6* 8.0* 8.2*  HCT 24.9* 25.8* 27.0*  PLT 254 264 309   Coag's No results found for this basename: APTT, INR,  in the last 168 hours BMET  Recent Labs Lab 03/01/14 0256 03/02/14 0400 03/03/14 0335  NA 136* 136* 139  K 4.3 4.0 3.5*  CL 94* 93* 94*  CO2 29 30 31   BUN 28* 27* 26*  CREATININE 1.12* 1.07 1.04  GLUCOSE 215* 160* 133*   Electrolytes  Recent Labs Lab 02/28/14 0310 03/01/14 0256 03/02/14 0400 03/03/14 0335  CALCIUM 8.6 8.9 8.7 8.6  MG 2.0 2.0 2.0  --   PHOS  --  3.1 3.9  --    Sepsis Markers  Recent Labs Lab 02/27/14 2055  PROCALCITON 0.79   ABG No results found for this basename: PHART, PCO2ART, PO2ART,  in the last 168 hours Liver Enzymes No results found  for this basename: AST, ALT, ALKPHOS, BILITOT, ALBUMIN,  in the last 168 hours Cardiac Enzymes  Recent Labs Lab 02/27/14 0540 02/27/14 1220 03/01/14 0256  TROPONINI  --  <0.30  --   PROBNP 3380.0*  --  5100.0*   Glucose  Recent Labs Lab 03/03/14 2258 03/04/14 0855 03/04/14 1211 03/04/14 1619 03/04/14 2225 03/05/14 0754  GLUCAP 275* 164* 307* 239* 243* 167*   IMAGING: No results found.  ASSESSMENT AND PLAN:  Acute on chronic respiratory failure > nearly resolved  Wean O2  Chronic obstructive pulmonary disease with exacerbation  Xopenex, Symbicort, Spiriva    Suspect OSA / OHS  May benefit form polysomnography as outpatient  Acute on chronic diastolic heart failure Acute pulmonary edema  Lasix  D/C foley  AF / RVR  Cardiology following, input appreciated  Cardizem gtt, titrate to off  Cardizem  Metoprolol  Pradaxa  Hypokalemia  K 40  GERD  Protonix  Anemia of chronic disease  DM Hyperglycemia  SSI  Lantus 5  I have personally obtained history, examined patient, evaluated and interpreted laboratory and imaging results, reviewed medical records, formulated assessment / plan and placed orders.  Roselie Awkward, MD [place] [facility] Pager: [phone] Cell: [phone] If no response, call [phone]   03/05/2014, 11:19 AM

## 2014-03-05 NOTE — Clinical Social Work Note (Signed)
CSW checked in with patient at bedside today. Patient states that she is feeling better compared to her previous days in the hospital. She states that she is thinking about going to a different SNF at discharge. She states that her family is NOT saving a bed for her at [facility] as indicated by handoff report CSW received. Patient states that she would like to see is she can get into [facility] or [facility]. CSW explained that referrals will be made and CSW will follow up with her when offers are available. CSW was appreciative of CSW's visit.  Liz Beach MSW, [place], [place], [phone]

## 2014-03-05 NOTE — Progress Notes (Signed)
Results for MORNING, HALBERG (MRN [id number]) as of 03/05/2014 13:26  Ref. Range 03/04/2014 12:11 03/04/2014 16:19 03/04/2014 22:25 03/05/2014 07:54 03/05/2014 11:27  Glucose-Capillary Latest Range: 70-99 mg/dL 307 (H) 239 (H) 243 (H) 167 (H) 261 (H)   Postprandial CBGs continue to be greater than 180 mg/dl.  Fasting CBGs ok.  Recommend meal coverage Novolog 3-4 units TID if CBGs continue to be greater than 180 mg/dl.  May need to increase Novolog correction scale TID & HS to MODERATE. Will continue to follow.  Harvel Ricks RN BSN CDE

## 2014-03-05 NOTE — Progress Notes (Signed)
Physical Therapy Treatment Patient Details Name: Paige Hardin MRN: [id number] DOB: Nov 13, 1946 Today's Date: 03/05/2014    History of Present Illness The patient is 68 yo lady with PMH of COPD and dCHF with EF of 60% (2D echo on 01/15/14), who was admitted due to combined COPD and CHF exacerbation on 02/24/14. Pt intubated 3/31 and extubated 4/1    PT Comments    Pt is progressing well towards functional goals. Ambulation and sit to stand transfer goal updated (see care plan) Pt able to tolerate increased distance in ambulation on this date with RW. Pt expresses fear of walking reporting LE weakness. Pt to benefit from skilled acute PT to maximize mobility and address limitations listed below.   Follow Up Recommendations  SNF     Equipment Recommendations  None recommended by PT    Recommendations for Other Services       Precautions / Restrictions Precautions Precautions: Fall Restrictions Weight Bearing Restrictions: No    Mobility  Bed Mobility               General bed mobility comments: not assessed. pt in chair upon arrival  Transfers Overall transfer level: Needs assistance Equipment used: Rolling walker (2 wheeled) Transfers: Sit to/from Stand Sit to Stand: Min guard         General transfer comment: verbal cues for hand placement and safety; not to pull up RW. pt req a min to stand to collect and calm herself   Ambulation/Gait Ambulation/Gait assistance: Min guard Ambulation Distance (Feet): 70 Feet Assistive device: Rolling walker (2 wheeled) Gait Pattern/deviations: Step-through pattern;Decreased stride length;Wide base of support Gait velocity: cautious; guarded for falls   General Gait Details: pt req RW for UE stability. Pt req verbal encouragement to achieve distance goal. pt req two standing rest breaks reporting legs feeling weak and fatigued. pt used counter top for UE support for standing rest breaks. pt expresses fear of falling when walking and  low self efficacy to ambulate.     Stairs            Wheelchair Mobility    Modified Rankin (Stroke Patients Only)       Balance Overall balance assessment: Needs assistance Sitting-balance support: Feet supported;No upper extremity supported Sitting balance-Leahy Scale: Good     Standing balance support: During functional activity;Bilateral upper extremity supported Standing balance-Leahy Scale: Poor Standing balance comment: pt req UE support for standing rest breaks when amb.                     Cognition Arousal/Alertness: Awake/alert Behavior During Therapy: WFL for tasks assessed/performed Overall Cognitive Status: Within Functional Limits for tasks assessed                      Exercises General Exercises - Lower Extremity Long Arc Quad: Strengthening;Both;10 reps;Seated Hip Flexion/Marching: AROM;Both;10 reps;Seated Toe Raises: Strengthening;Both;10 reps;Seated Heel Raises: Strengthening;10 reps;Seated;Both    General Comments        Pertinent Vitals/Pain Hr rose to 120s during amb. Pt reports no pain    Home Living                      Prior Function            PT Goals (current goals can now be found in the care plan section) Acute Rehab PT Goals Patient Stated Goal: none stated  PT Goal Formulation: With patient Time For Goal Achievement: 03/13/14  Potential to Achieve Goals: Good Progress towards PT goals: Goals met and updated - see care plan    Frequency  Min 2X/week    PT Plan Current plan remains appropriate    Co-evaluation             End of Session Equipment Utilized During Treatment: Oxygen (2L O2) Activity Tolerance: Patient tolerated treatment well;Patient limited by fatigue Patient left: in chair;with call bell/phone within reach     Time: 0939-1002 PT Time Calculation (min): 23 min  Charges:                       G Codes:      Manley Mason SPT 2014/03/22, 10:32 AM

## 2014-03-05 NOTE — Progress Notes (Signed)
Physical Therapy Treatment Patient Details Name: Paige Hardin MRN: [id number] DOB: Nov 13, 1946 Today's Date: 03/05/2014    History of Present Illness The patient is 68 yo lady with PMH of COPD and dCHF with EF of 60% (2D echo on 01/15/14), who was admitted due to combined COPD and CHF exacerbation on 02/24/14. Pt intubated 3/31 and extubated 4/1    PT Comments    Pt is progressing well towards functional goals. Ambulation and sit to stand transfer goal updated (see care plan) Pt able to tolerate increased distance in ambulation on this date with RW. Pt expresses fear of walking reporting LE weakness. Pt to benefit from skilled acute PT to maximize mobility and address limitations listed below.   Follow Up Recommendations  SNF     Equipment Recommendations  None recommended by PT    Recommendations for Other Services       Precautions / Restrictions Precautions Precautions: Fall Restrictions Weight Bearing Restrictions: No    Mobility  Bed Mobility               General bed mobility comments: not assessed. pt in chair upon arrival  Transfers Overall transfer level: Needs assistance Equipment used: Rolling walker (2 wheeled) Transfers: Sit to/from Stand Sit to Stand: Min guard         General transfer comment: verbal cues for hand placement and safety; not to pull up RW. pt req a min to stand to collect and calm herself   Ambulation/Gait Ambulation/Gait assistance: Min guard Ambulation Distance (Feet): 70 Feet Assistive device: Rolling walker (2 wheeled) Gait Pattern/deviations: Step-through pattern;Decreased stride length;Wide base of support Gait velocity: cautious; guarded for falls   General Gait Details: pt req RW for UE stability. Pt req verbal encouragement to achieve distance goal. pt req two standing rest breaks reporting legs feeling weak and fatigued. pt used counter top for UE support for standing rest breaks. pt expresses fear of falling when walking and  low self efficacy to ambulate.     Stairs            Wheelchair Mobility    Modified Rankin (Stroke Patients Only)       Balance Overall balance assessment: Needs assistance Sitting-balance support: Feet supported;No upper extremity supported Sitting balance-Leahy Scale: Good     Standing balance support: During functional activity;Bilateral upper extremity supported Standing balance-Leahy Scale: Poor Standing balance comment: pt req UE support for standing rest breaks when amb.                     Cognition Arousal/Alertness: Awake/alert Behavior During Therapy: WFL for tasks assessed/performed Overall Cognitive Status: Within Functional Limits for tasks assessed                      Exercises General Exercises - Lower Extremity Long Arc Quad: Strengthening;Both;10 reps;Seated Hip Flexion/Marching: AROM;Both;10 reps;Seated Toe Raises: Strengthening;Both;10 reps;Seated Heel Raises: Strengthening;10 reps;Seated;Both    General Comments        Pertinent Vitals/Pain Hr rose to 120s during amb. Pt reports no pain    Home Living                      Prior Function            PT Goals (current goals can now be found in the care plan section) Acute Rehab PT Goals Patient Stated Goal: none stated  PT Goal Formulation: With patient Time For Goal Achievement: 03/13/14  Potential to Achieve Goals: Good Progress towards PT goals: Goals met and updated - see care plan    Frequency  Min 2X/week    PT Plan Current plan remains appropriate    Co-evaluation             End of Session Equipment Utilized During Treatment: Oxygen (2L O2) Activity Tolerance: Patient tolerated treatment well;Patient limited by fatigue Patient left: in chair;with call bell/phone within reach     Time: 0939-1002 PT Time Calculation (min): 23 min  Charges:                       G Codes:      Manley Mason SPT March 23, 2014, 10:32 AM  Agree with  above assessment.  Kittie Plater, PT, DPT Pager #: [phone] Office #: [phone]

## 2014-03-06 ENCOUNTER — Inpatient Hospital Stay (HOSPITAL_COMMUNITY): Payer: Medicare Other

## 2014-03-06 LAB — BASIC METABOLIC PANEL
BUN: 23 mg/dL (ref 6–23)
CO2: 30 mEq/L (ref 19–32)
Calcium: 8.8 mg/dL (ref 8.4–10.5)
Chloride: 94 mEq/L — ABNORMAL LOW (ref 96–112)
Creatinine, Ser: 1.09 mg/dL (ref 0.50–1.10)
GFR calc Af Amer: 59 mL/min — ABNORMAL LOW (ref >90)
GFR calc non Af Amer: 51 mL/min — ABNORMAL LOW (ref >90)
Glucose, Bld: 139 mg/dL — ABNORMAL HIGH (ref 70–99)
Potassium: 3.5 mEq/L — ABNORMAL LOW (ref 3.7–5.3)
Sodium: 137 mEq/L (ref 137–147)

## 2014-03-06 LAB — GLUCOSE, CAPILLARY
GLUCOSE-CAPILLARY: 238 mg/dL — AB (ref 70–99)
Glucose-Capillary: 162 mg/dL — ABNORMAL HIGH (ref 70–99)
Glucose-Capillary: 290 mg/dL — ABNORMAL HIGH (ref 70–99)
Glucose-Capillary: 267 mg/dL — ABNORMAL HIGH (ref 70–99)

## 2014-03-06 MED ORDER — POTASSIUM CHLORIDE CRYS ER 20 MEQ PO TBCR
40.0000 meq | EXTENDED_RELEASE_TABLET | Freq: Once | ORAL | Status: AC
  Administered 2014-03-06: 40 meq via ORAL
  Filled 2014-03-06: qty 2

## 2014-03-06 MED ORDER — INSULIN ASPART 100 UNIT/ML ~~LOC~~ SOLN
3.0000 [IU] | Freq: Three times a day (TID) | SUBCUTANEOUS | Status: DC
  Administered 2014-03-06 – 2014-03-12 (×17): 3 [IU] via SUBCUTANEOUS

## 2014-03-06 MED ORDER — FUROSEMIDE 10 MG/ML IJ SOLN
40.0000 mg | Freq: Once | INTRAMUSCULAR | Status: AC
  Administered 2014-03-06: 40 mg via INTRAVENOUS
  Filled 2014-03-06: qty 4

## 2014-03-06 NOTE — Progress Notes (Signed)
PULMONARY / CRITICAL CARE MEDICINE  Name: Paige Hardin MRN: [id number] DOB: Jan 03, 1946  BRIEF PATIENT DESCRIPTION: 68 y/o with COPD ( [place] ) and dCHF admitted 3/31 with acute on chronic respiratory failure secondary to COPD and CHF exacerbation on 02/24/14.  SIGNIFICANT EVENTS / STUDIES:   4/3  LE venous Doppler >>> neg  LINES / TUBES: OETT 3/31 >>> 4/1  CULTURES: 3/31  Urine >>> neg 3/31  Blood >>> neg  ANTIBIOTICS: Vanco 3/31 >>> 4/1 Zosyn 3/31 >>> 4/1 Levaquin 4/4 >>>4/7  INTERVAL HISTORY:  Pt reports she is breathing more comfortably, notices periods of tightness with rate increase.  Cardizem gtt off.  Catheter removed 4/9, last urination 2330  VITAL SIGNS: Temp:  [98.2 F (36.8 C)-99.3 F (37.4 C)] 98.4 F (36.9 C) (04/10 0737) Pulse Rate:  [39-131] 131 (04/10 0737) Resp:  [15-31] 21 (04/10 0737) BP: (105-156)/(52-98) 113/58 mmHg (04/10 0605) SpO2:  [93 %-98 %] 93 % (04/10 0737)  PHYSICAL EXAMINATION: General: no acute distress, up to chair HEENT: NCAT, OP clear PULM: few crackles in bases CV: Irreg irreg, tachy, no murmur AB: BS+, soft, nontedner Ext: warm, 1-2+ pretibial edema, bruising to bilateral forearms  Neuro: A&Ox4, MAEW  LABS: CBC  Recent Labs Lab 02/28/14 0310 03/01/14 0256 03/02/14 0400  WBC 12.0* 11.6* 11.9*  HGB 7.6* 8.0* 8.2*  HCT 24.9* 25.8* 27.0*  PLT 254 264 309   BMET  Recent Labs Lab 03/02/14 0400 03/03/14 0335 03/06/14 0248  NA 136* 139 137  K 4.0 3.5* 3.5*  CL 93* 94* 94*  CO2 30 31 30   BUN 27* 26* 23  CREATININE 1.07 1.04 1.09  GLUCOSE 160* 133* 139*   Electrolytes  Recent Labs Lab 02/28/14 0310 03/01/14 0256 03/02/14 0400 03/03/14 0335 03/06/14 0248  CALCIUM 8.6 8.9 8.7 8.6 8.8  MG 2.0 2.0 2.0  --   --   PHOS  --  3.1 3.9  --   --    Glucose  Recent Labs Lab 03/04/14 1619 03/04/14 2225 03/05/14 0754 03/05/14 1127 03/05/14 1601 03/05/14 2146  GLUCAP 239* 243* 167* 261* 239* 224*   IMAGING: No  results found.    ASSESSMENT AND PLAN:  Acute on chronic respiratory failure - resolved  Plan: Wean O2 to off, assess ambulatory desaturation prior to discharge Pulmonary hygiene, mobilize  Chronic obstructive pulmonary disease with exacerbation  Plan: Continue Xopenex, Symbicort, Spiriva, Mucinex    Suspect OSA / OHS  Plan: May benefit form polysomnography as outpatient  Acute on chronic diastolic heart failure Acute pulmonary edema  Plan: Repeat lasix 40 mg x1 with KCL Has not urinated since 2330 4/10, discussed with patient that she may require I/O cath  AF / RVR  Plan: Cardiology following, input appreciated Cardizem PO Metoprolol Pradaxa  Hypokalemia  Plan: Supplemental K with lasix Repeat BMP in am  GERD  Plan: Protonix Diet as tolerated  Anemia of chronic disease  Plan: monitor CBC  DM Hyperglycemia  Plan: SSI Lantus 5   [name], NP-C [facility] Pgr: [phone] or [phone]   I have personally obtained history, examined patient, evaluated and interpreted laboratory and imaging results, reviewed medical records, formulated assessment / plan and placed orders.   03/06/2014, 9:58 AM  Attending:  I have seen and examined the patient with nurse practitioner/resident and agree with the note above.   Afib RVR still a problem Lungs doing well, perhaps some edema still there but really no active pulmonary problems aside from  chronic COPD Ambulate IS Repeat CXR  Roselie Awkward, MD [place] [facility] Pager: [phone] Cell: [phone] If no response, call [phone]

## 2014-03-06 NOTE — Progress Notes (Signed)
[name] NP paged to ask about starting Novolog meal coverage. Order written for Novolog 3 units with meals as meal coverage. Postprandials continue to be elevated. Harvel Ricks RN BSN CDE

## 2014-03-07 DIAGNOSIS — Z7901 Long term (current) use of anticoagulants: Secondary | ICD-10-CM

## 2014-03-07 DIAGNOSIS — R9431 Abnormal electrocardiogram [ECG] [EKG]: Secondary | ICD-10-CM

## 2014-03-07 LAB — CBC
HCT: 26.2 % — ABNORMAL LOW (ref 36.0–46.0)
HEMOGLOBIN: 8 g/dL — AB (ref 12.0–15.0)
MCH: 26.9 pg (ref 26.0–34.0)
MCHC: 30.5 g/dL (ref 30.0–36.0)
MCV: 88.2 fL (ref 78.0–100.0)
Platelets: 309 10*3/uL (ref 150–400)
RBC: 2.97 MIL/uL — ABNORMAL LOW (ref 3.87–5.11)
RDW: 17.3 % — AB (ref 11.5–15.5)
WBC: 10.8 10*3/uL — ABNORMAL HIGH (ref 4.0–10.5)

## 2014-03-07 LAB — BASIC METABOLIC PANEL
BUN: 27 mg/dL — ABNORMAL HIGH (ref 6–23)
CHLORIDE: 96 meq/L (ref 96–112)
CO2: 28 meq/L (ref 19–32)
Calcium: 9 mg/dL (ref 8.4–10.5)
Creatinine, Ser: 1.2 mg/dL — ABNORMAL HIGH (ref 0.50–1.10)
GFR calc Af Amer: 53 mL/min — ABNORMAL LOW (ref >90)
GFR calc non Af Amer: 45 mL/min — ABNORMAL LOW (ref >90)
GLUCOSE: 137 mg/dL — AB (ref 70–99)
POTASSIUM: 4.1 meq/L (ref 3.7–5.3)
Sodium: 138 mEq/L (ref 137–147)

## 2014-03-07 LAB — GLUCOSE, CAPILLARY
GLUCOSE-CAPILLARY: 153 mg/dL — AB (ref 70–99)
GLUCOSE-CAPILLARY: 243 mg/dL — AB (ref 70–99)
Glucose-Capillary: 222 mg/dL — ABNORMAL HIGH (ref 70–99)
Glucose-Capillary: 214 mg/dL — ABNORMAL HIGH (ref 70–99)

## 2014-03-07 MED ORDER — DILTIAZEM HCL ER COATED BEADS 300 MG PO CP24
300.0000 mg | ORAL_CAPSULE | Freq: Every day | ORAL | Status: DC
  Administered 2014-03-07 – 2014-03-08 (×2): 300 mg via ORAL
  Filled 2014-03-07 (×2): qty 1

## 2014-03-07 MED ORDER — METOPROLOL TARTRATE 50 MG PO TABS
50.0000 mg | ORAL_TABLET | Freq: Two times a day (BID) | ORAL | Status: DC
  Administered 2014-03-07 – 2014-03-12 (×10): 50 mg via ORAL
  Filled 2014-03-07 (×11): qty 1

## 2014-03-07 NOTE — Clinical Social Work Note (Signed)
CSW continues to provide follow-up with d/c planning needs. CSW met with patient at bedside and informed patient of bed offer at [facility]. Patient became excited and let out a long sigh. Patient reported she was really happy she was able to get a bed at [facility]. Patient expressed appreciation with CSW's visit.  [place], [place] Weekend Clinical Social Worker [phone]

## 2014-03-07 NOTE — Progress Notes (Signed)
Pt to [place] to 3E-10, called report. Family present & aware.

## 2014-03-07 NOTE — Progress Notes (Signed)
PULMONARY / CRITICAL CARE MEDICINE  Name: Paige Hardin MRN: [id number] DOB: [birth year]  BRIEF PATIENT DESCRIPTION: 68 y/o with COPD ( [place] ) and dCHF admitted 3/31 with acute on chronic respiratory failure secondary to COPD and CHF exacerbation on 02/24/14.  SIGNIFICANT EVENTS / STUDIES:   4/3  LE venous Doppler >>> neg  LINES / TUBES: OETT 3/31 >>> 4/1  CULTURES: 3/31  Urine >>> neg 3/31  Blood >>> neg  ANTIBIOTICS: Vanco 3/31 >>> 4/1 Zosyn 3/31 >>> 4/1 Levaquin 4/4 >>>4/7  INTERVAL HISTORY:   Doing well, no new issues  VITAL SIGNS: Temp:  [98 F (36.7 C)-98.7 F (37.1 C)] 98.1 F (36.7 C) (04/11 0700) Pulse Rate:  [34-148] 89 (04/11 0700) Resp:  [16-25] 18 (04/11 0700) BP: (107-131)/(50-94) 123/81 mmHg (04/11 0700) SpO2:  [95 %-100 %] 95 % (04/11 0934)  PHYSICAL EXAMINATION: General: no acute distress, up to chair HEENT: NCAT, OP clear PULM: few crackles in bases CV: Irreg irreg, tachy, no murmur AB: BS+, soft, nontedner Ext: warm, 1-2+ pretibial edema, bruising to bilateral forearms  Neuro: A&Ox4, MAEW  LABS: CBC  Recent Labs Lab 03/01/14 0256 03/02/14 0400 03/07/14 0413  WBC 11.6* 11.9* 10.8*  HGB 8.0* 8.2* 8.0*  HCT 25.8* 27.0* 26.2*  PLT 264 309 309   BMET  Recent Labs Lab 03/03/14 0335 03/06/14 0248 03/07/14 0413  NA 139 137 138  K 3.5* 3.5* 4.1  CL 94* 94* 96  CO2 31 30 28   BUN 26* 23 27*  CREATININE 1.04 1.09 1.20*  GLUCOSE 133* 139* 137*   Electrolytes  Recent Labs Lab 03/01/14 0256 03/02/14 0400 03/03/14 0335 03/06/14 0248 03/07/14 0413  CALCIUM 8.9 8.7 8.6 8.8 9.0  MG 2.0 2.0  --   --   --   PHOS 3.1 3.9  --   --   --    Glucose  Recent Labs Lab 03/05/14 2146 03/06/14 0734 03/06/14 1136 03/06/14 1609 03/06/14 2140 03/07/14 0749  GLUCAP 224* 162* 290* 267* 238* 153*   IMAGING: Dg Chest 2 View  03/06/2014   CLINICAL DATA:  Hypoxia.  EXAM: CHEST  2 VIEW  COMPARISON:  DG CHEST 1V PORT dated 03/01/2014  FINDINGS:  Cardiomegaly with pulmonary vascular prominence and interstitial prominence noted with mild pleural effusions. These findings have improved slightly from prior exam. No acute bony abnormality identified.  IMPRESSION: Congestive heart failure with pulmonary interstitial edema and bilateral pleural effusions. These findings have improved from prior study of 03/01/2014.   Electronically Signed   By: Marcello Moores  Register   On: 03/06/2014 14:26      ASSESSMENT AND PLAN:  Acute on chronic respiratory failure - resolved  Plan: Wean O2 to off, assess ambulatory desaturation prior to discharge Pulmonary hygiene, mobilize  Chronic obstructive pulmonary disease with exacerbation  Plan: Continue Xopenex, Symbicort, Spiriva, Mucinex    Suspect OSA / OHS  Plan: May benefit form polysomnography as outpatient  Acute on chronic diastolic heart failure Acute pulmonary edema  Plan: No lasix 4/11 Has not urinated since 2330 4/10, discussed with patient that she may require I/O cath  AF / RVR  Plan: Cardiology has signed off Cardizem PO>>may need 360/d , start with 300 daily Metoprolol>>increase to 100/d  Pradaxa  Hypokalemia  Plan: Supplemental K with lasix Repeat BMP in am  GERD  Plan: Protonix Diet as tolerated  Anemia of chronic disease  Plan: monitor CBC  DM Hyperglycemia  Plan: SSI Lantus 5   Transfer to floor tele bed.  I have personally obtained history, examined patient, evaluated and interpreted laboratory and imaging results, reviewed medical records, formulated assessment / plan and placed orders.  Burnett Harry WrightMD Beeper  [phone]  Cell  [phone]  If no response or cell goes to voicemail, call beeper [phone]  03/07/2014, 10:39 AM

## 2014-03-07 NOTE — Progress Notes (Signed)
House Client received from [place].  Placed on telemetry.  Oxygen saturation 88-90 upon arrival.  Client put on 2L/[place].  Respiratory exchange even and unlabored.  Weighed and vital signs taken.  1823 No respiratory distress noted.Head of bed elevated at 30 degrees.  IV site unremarkable.

## 2014-03-08 LAB — CBC
HEMATOCRIT: 26.8 % — AB (ref 36.0–46.0)
Hemoglobin: 8.1 g/dL — ABNORMAL LOW (ref 12.0–15.0)
MCH: 26.7 pg (ref 26.0–34.0)
MCHC: 30.2 g/dL (ref 30.0–36.0)
MCV: 88.4 fL (ref 78.0–100.0)
Platelets: 287 10*3/uL (ref 150–400)
RBC: 3.03 MIL/uL — ABNORMAL LOW (ref 3.87–5.11)
RDW: 17.2 % — ABNORMAL HIGH (ref 11.5–15.5)
WBC: 11.4 10*3/uL — ABNORMAL HIGH (ref 4.0–10.5)

## 2014-03-08 LAB — BASIC METABOLIC PANEL
BUN: 28 mg/dL — AB (ref 6–23)
CHLORIDE: 94 meq/L — AB (ref 96–112)
CO2: 26 meq/L (ref 19–32)
Calcium: 9.1 mg/dL (ref 8.4–10.5)
Creatinine, Ser: 1.24 mg/dL — ABNORMAL HIGH (ref 0.50–1.10)
GFR calc Af Amer: 51 mL/min — ABNORMAL LOW (ref >90)
GFR calc non Af Amer: 44 mL/min — ABNORMAL LOW (ref >90)
Glucose, Bld: 235 mg/dL — ABNORMAL HIGH (ref 70–99)
POTASSIUM: 4.4 meq/L (ref 3.7–5.3)
Sodium: 134 mEq/L — ABNORMAL LOW (ref 137–147)

## 2014-03-08 LAB — GLUCOSE, CAPILLARY
GLUCOSE-CAPILLARY: 287 mg/dL — AB (ref 70–99)
GLUCOSE-CAPILLARY: 169 mg/dL — AB (ref 70–99)
Glucose-Capillary: 233 mg/dL — ABNORMAL HIGH (ref 70–99)
Glucose-Capillary: 246 mg/dL — ABNORMAL HIGH (ref 70–99)

## 2014-03-08 MED ORDER — POTASSIUM CHLORIDE CRYS ER 20 MEQ PO TBCR
20.0000 meq | EXTENDED_RELEASE_TABLET | Freq: Every day | ORAL | Status: DC
  Administered 2014-03-08 – 2014-03-12 (×5): 20 meq via ORAL
  Filled 2014-03-08 (×5): qty 1

## 2014-03-08 MED ORDER — DILTIAZEM HCL ER COATED BEADS 360 MG PO CP24: 360.0000 mg | ORAL_CAPSULE | Freq: Every day | ORAL | Status: DC

## 2014-03-08 MED ORDER — FUROSEMIDE 40 MG PO TABS
40.0000 mg | ORAL_TABLET | Freq: Two times a day (BID) | ORAL | Status: DC
  Administered 2014-03-08 – 2014-03-10 (×5): 40 mg via ORAL
  Filled 2014-03-08 (×8): qty 1

## 2014-03-08 MED ORDER — DILTIAZEM HCL ER COATED BEADS 180 MG PO CP24
180.0000 mg | ORAL_CAPSULE | Freq: Every day | ORAL | Status: DC
  Administered 2014-03-09 – 2014-03-12 (×4): 180 mg via ORAL
  Filled 2014-03-08 (×4): qty 1

## 2014-03-08 MED ORDER — FLECAINIDE ACETATE 50 MG PO TABS
50.0000 mg | ORAL_TABLET | Freq: Two times a day (BID) | ORAL | Status: DC
  Administered 2014-03-08: 50 mg via ORAL
  Filled 2014-03-08 (×2): qty 1

## 2014-03-08 MED ORDER — AMIODARONE HCL 200 MG PO TABS
400.0000 mg | ORAL_TABLET | Freq: Two times a day (BID) | ORAL | Status: DC
  Administered 2014-03-08 – 2014-03-12 (×8): 400 mg via ORAL
  Filled 2014-03-08 (×9): qty 2

## 2014-03-08 NOTE — Progress Notes (Signed)
Patient Name: Paige Hardin      SUBJECTIVE: Chart reviewed. Patient has paroxysms of atrial fibrillation associated with acute decompensated HFpEF and respiratory failure. There is an interaction with her COPD. Attempts were used with flecainide; these were unsuccessful. Review of her atrial dimension last fall was 47/1.99  She has been averse to the idea of AV junction ablation and pacing.  ECG is early in the hospitalization were associated with QT prolongation per Dr. Girard Cooter notes  It is noteworthy that she was in sinus rhythm on 3/31 she was admitted 4/1 and Pradaxa was started 4/2   Past Medical History  Diagnosis Date  . Chronic low back pain   . Emphysema   . Paroxysmal atrial fibrillation   . COPD (chronic obstructive pulmonary disease)   . Hyperlipidemia   . Diastolic congestive heart failure   . Obese   . Carotid atherosclerosis   . Lung nodules   . Hoarseness, chronic   . Pneumonia 1990's    "once"  . Type 2 diabetes mellitus   . GERD (gastroesophageal reflux disease)   . Arthritis     "joints" (01/14/2014)  . Depression     "maybe" (01/14/2014    Scheduled Meds:  Scheduled Meds: . budesonide-formoterol  2 puff Inhalation BID  . dabigatran  150 mg Oral Q12H  . dextromethorphan-guaiFENesin  1 tablet Oral BID  . [START ON 03/09/2014] diltiazem  360 mg Oral Daily  . docusate sodium  100 mg Oral BID  . flecainide  50 mg Oral Q12H  . furosemide  40 mg Oral BID  . insulin aspart  0-9 Units Subcutaneous TID AC & HS  . insulin aspart  3 Units Subcutaneous TID WC  . insulin glargine  5 Units Subcutaneous QHS  . metoprolol tartrate  50 mg Oral BID  . pantoprazole  40 mg Oral Q1200  . potassium chloride  20 mEq Oral Daily  . tiotropium  18 mcg Inhalation Daily   Continuous Infusions:   PHYSICAL EXAM Filed Vitals:   03/08/14 0159 03/08/14 0500 03/08/14 0900 03/08/14 1427  BP: 115/63 115/97 117/97 119/78  Pulse: 125 118 139 63  Temp:  98 F (36.7 C) 98.3  F (36.8 C) 98 F (36.7 C)  TempSrc:  Oral Oral Oral  Resp: 18 18 22 20   Height:      Weight:  288 lb 12.8 oz (131 kg)    SpO2: 97% 97% 95% 93%   Well developed and morbidly obese in no acute distress HENT normal Neck supple with JVP-flat Carotids brisk and full without bruits Clear Irregularly irregular rate and rhythm with rapid ventricular response, no murmurs or gallops Abd-soft with active BS without hepatomegaly No Clubbing cyanosis brawny bilateral edema Skin-warm and dry A & Oriented  Grossly normal sensory and motor function  TELEMETRY: Reviewed telemetry pt in A. fib with a rapid rate:    Intake/Output Summary (Last 24 hours) at 03/08/14 1550 Last data filed at 03/08/14 1427  Gross per 24 hour  Intake    960 ml  Output   1325 ml  Net   -365 ml    LABS: Basic Metabolic Panel:  Recent Labs Lab 03/02/14 0400 03/03/14 0335 03/06/14 0248 03/07/14 0413 03/08/14 0500  NA 136* 139 137 138 134*  K 4.0 3.5* 3.5* 4.1 4.4  CL 93* 94* 94* 96 94*  CO2 30 31 30 28 26   GLUCOSE 160* 133* 139* 137* 235*  BUN 27* 26*  23 27* 28*  CREATININE 1.07 1.04 1.09 1.20* 1.24*  CALCIUM 8.7 8.6 8.8 9.0 9.1  MG 2.0  --   --   --   --   PHOS 3.9  --   --   --   --    Cardiac Enzymes: No results found for this basename: CKTOTAL, CKMB, CKMBINDEX, TROPONINI,  in the last 72 hours CBC:  Recent Labs Lab 03/02/14 0400 03/07/14 0413 03/08/14 0500  WBC 11.9* 10.8* 11.4*  NEUTROABS 9.8*  --   --   HGB 8.2* 8.0* 8.1*  HCT 27.0* 26.2* 26.8*  MCV 89.4 88.2 88.4  PLT 309 309 287   PROTIME: No results found for this basename: LABPROT, INR,  in the last 72 hours Liver Function Tests: No results found for this basename: AST, ALT, ALKPHOS, BILITOT, PROT, ALBUMIN,  in the last 72 hours No results found for this basename: LIPASE, AMYLASE,  in the last 72 hours BNP: BNP (last 3 results)  Recent Labs  02/24/14 2220 02/27/14 0540 03/01/14 0256  PROBNP 1615.0* 3380.0* 5100.0*    D-Dimer: No results found for this basename: DDIMER,  in the last 72 hours  ECG demonstrates atrial fibrillation at 104 Intervals-/09/32 with a QTC of 43  ASSESSMENT AND PLAN:  Principal Problem:   Acute respiratory failure Active Problems:   HYPERLIPIDEMIA-MIXED   COPD with emphysema    CAROTID BRUIT   PAF (paroxysmal atrial fibrillation)   Acute on chronic diastolic congestive heart failure   HTN (hypertension)   Chronic anticoagulation   Pulmonary HTN- pa 53 mmHg Echo 01/15/14   Morbid obesity- BMI 50   Poorly controlled diabetes mellitus   Chronic renal disease, stage III   Atrial fibrillation with RVR   COPD exacerbation   Hypoxemia   Prolonged Q-T interval on ECG  The patient has had recurrent paroxysms of atrial fibrillation associated with respiratory failure from interaction between HFpEF and COPD exacerbations. I have reviewed with Dr Asencion Noble we will begin her on amiodarone. Will anticipate cardioversion in 3 days. She has been adequately anticoagulated so TEE will not be necessary. In the event that she fails, she is open to considering AV junction ablation and pacing  We reviewed side effects of amiodarone Signed, Deboraha Sprang MD  03/08/2014

## 2014-03-08 NOTE — Plan of Care (Addendum)
Problem: Consults Goal: Skin Care Protocol Initiated - if Braden Score 18 or less If consults are not indicated, leave blank or document N/A  Outcome: Progressing Areas if swelling infiltrations occurred has decreased and soft to touch.    Dr. Caryl Comes from cardiology in to evaluate client this morning.  Medications were changed and 12 lead EKG completed.  Physician spoke with client and daughter about her rapid heart rate.  Both verbalized understanding.  Heart rate has been 130- 146 at rest.  Other vital signs are within normal for her.

## 2014-03-08 NOTE — Progress Notes (Addendum)
PULMONARY / CRITICAL CARE MEDICINE  Name: Paige Hardin MRN: [id number] DOB: 06-28-1946  BRIEF PATIENT DESCRIPTION: 68 y/o with COPD ( [place] ) and dCHF admitted 3/31 with acute on chronic respiratory failure secondary to COPD and CHF exacerbation on 02/24/14.  SIGNIFICANT EVENTS / STUDIES:   4/3  LE venous Doppler >>> neg  LINES / TUBES: OETT 3/31 >>> 4/1  CULTURES: 3/31  Urine >>> neg 3/31  Blood >>> neg  ANTIBIOTICS: Vanco 3/31 >>> 4/1 Zosyn 3/31 >>> 4/1 Levaquin 4/4 >>>4/7  INTERVAL HISTORY:   Doing well, no new issues but HR still too fast  VITAL SIGNS: Temp:  [97.9 F (36.6 C)-98.3 F (36.8 C)] 98.3 F (36.8 C) (04/12 0900) Pulse Rate:  [118-139] 139 (04/12 0900) Resp:  [18-22] 22 (04/12 0900) BP: (99-141)/(46-97) 117/97 mmHg (04/12 0900) SpO2:  [89 %-97 %] 95 % (04/12 0900) Weight:  [131 kg (288 lb 12.8 oz)-131.4 kg (289 lb 11 oz)] 131 kg (288 lb 12.8 oz) (04/12 0500)  PHYSICAL EXAMINATION: General: no acute distress, up to chair HEENT: NCAT, OP clear PULM: few crackles in bases CV: Irreg irreg, tachy, no murmur AB: BS+, soft, nontedner Ext: warm, 1-2+ pretibial edema, bruising to bilateral forearms  Neuro: A&Ox4, MAEW  LABS: CBC  Recent Labs Lab 03/02/14 0400 03/07/14 0413 03/08/14 0500  WBC 11.9* 10.8* 11.4*  HGB 8.2* 8.0* 8.1*  HCT 27.0* 26.2* 26.8*  PLT 309 309 287   BMET  Recent Labs Lab 03/06/14 0248 03/07/14 0413 03/08/14 0500  NA 137 138 134*  K 3.5* 4.1 4.4  CL 94* 96 94*  CO2 30 28 26   BUN 23 27* 28*  CREATININE 1.09 1.20* 1.24*  GLUCOSE 139* 137* 235*   Electrolytes  Recent Labs Lab 03/02/14 0400  03/06/14 0248 03/07/14 0413 03/08/14 0500  CALCIUM 8.7  < > 8.8 9.0 9.1  MG 2.0  --   --   --   --   PHOS 3.9  --   --   --   --   < > = values in this interval not displayed. Glucose  Recent Labs Lab 03/06/14 2140 03/07/14 0749 03/07/14 1110 03/07/14 1632 03/07/14 2128 03/08/14 0601  GLUCAP 238* 153* 222* 243* 214*  233*   IMAGING: Dg Chest 2 View  03/06/2014   CLINICAL DATA:  Hypoxia.  EXAM: CHEST  2 VIEW  COMPARISON:  DG CHEST 1V PORT dated 03/01/2014  FINDINGS: Cardiomegaly with pulmonary vascular prominence and interstitial prominence noted with mild pleural effusions. These findings have improved slightly from prior exam. No acute bony abnormality identified.  IMPRESSION: Congestive heart failure with pulmonary interstitial edema and bilateral pleural effusions. These findings have improved from prior study of 03/01/2014.   Electronically Signed   By: Marcello Moores  Register   On: 03/06/2014 14:26      ASSESSMENT AND PLAN:  Acute on chronic respiratory failure - resolved  Plan: Wean O2 to off, assess ambulatory desaturation prior to discharge Pulmonary hygiene, mobilize  Chronic obstructive pulmonary disease with exacerbation  Plan: Continue Xopenex, Symbicort, Spiriva, Mucinex    Suspect OSA / OHS  Plan: May benefit form polysomnography as outpatient  Acute on chronic diastolic heart failure Acute pulmonary edema  Plan: Resume lasix 40mg  bid and daily KCL 25meq home dose  AF / RVR Rate still too fast  Plan: Cardiology has signed off Cardizem PO>>increase to 360mg /d Metoprolol>>increased to 100/d  4/11 tambocor never resumed >>resume flecainide Pradaxa May need cardiology visit one more time before  d/c back to snf  Hypokalemia  Plan: Supplemental K with lasix Repeat BMP in am  GERD  Plan: Protonix Diet as tolerated  Anemia of chronic disease  Plan: monitor CBC  DM Hyperglycemia  Plan: SSI Lantus 5   Work on snf bed this week   I have personally obtained history, examined patient, evaluated and interpreted laboratory and imaging results, reviewed medical records, formulated assessment / plan and placed orders.  Burnett Harry WrightMD Beeper  [phone]  Cell  [phone]  If no response or cell goes to voicemail, call beeper [phone]  03/08/2014, 10:35  AM

## 2014-03-09 DIAGNOSIS — I4891 Unspecified atrial fibrillation: Secondary | ICD-10-CM

## 2014-03-09 DIAGNOSIS — I5033 Acute on chronic diastolic (congestive) heart failure: Secondary | ICD-10-CM

## 2014-03-09 DIAGNOSIS — J9620 Acute and chronic respiratory failure, unspecified whether with hypoxia or hypercapnia: Secondary | ICD-10-CM

## 2014-03-09 LAB — BASIC METABOLIC PANEL
BUN: 35 mg/dL — ABNORMAL HIGH (ref 6–23)
CO2: 25 mEq/L (ref 19–32)
Calcium: 9.2 mg/dL (ref 8.4–10.5)
Chloride: 95 mEq/L — ABNORMAL LOW (ref 96–112)
Creatinine, Ser: 1.47 mg/dL — ABNORMAL HIGH (ref 0.50–1.10)
GFR calc non Af Amer: 35 mL/min — ABNORMAL LOW (ref >90)
GFR, EST AFRICAN AMERICAN: 41 mL/min — AB (ref >90)
GLUCOSE: 233 mg/dL — AB (ref 70–99)
POTASSIUM: 4.5 meq/L (ref 3.7–5.3)
Sodium: 135 mEq/L — ABNORMAL LOW (ref 137–147)

## 2014-03-09 LAB — GLUCOSE, CAPILLARY
GLUCOSE-CAPILLARY: 142 mg/dL — AB (ref 70–99)
Glucose-Capillary: 230 mg/dL — ABNORMAL HIGH (ref 70–99)
Glucose-Capillary: 283 mg/dL — ABNORMAL HIGH (ref 70–99)
Glucose-Capillary: 263 mg/dL — ABNORMAL HIGH (ref 70–99)

## 2014-03-09 MED ORDER — INSULIN GLARGINE 100 UNIT/ML ~~LOC~~ SOLN
15.0000 [IU] | Freq: Every day | SUBCUTANEOUS | Status: DC
  Administered 2014-03-09 – 2014-03-11 (×3): 15 [IU] via SUBCUTANEOUS
  Filled 2014-03-09 (×4): qty 0.15

## 2014-03-09 NOTE — Progress Notes (Signed)
Patient: Paige Hardin Date of Encounter: 03/09/2014, 7:28 AM Admit date: 02/24/2014     Subjective  Ms. Birnie has no new complaints this AM. She feels her breathing is better this AM and she has been ambulating slowly in the room. She denies CP or palpitations.   Objective  Physical Exam: Vitals: BP 107/68  Pulse 97  Temp(Src) 98.1 F (36.7 C) (Oral)  Resp 18  Ht 5\' 5"  (1.651 m)  Wt 290 lb 11.2 oz (131.861 kg)  BMI 48.38 kg/m2  SpO2 97% General: Well developed, overweight 68 year old female in no acute distress. Neck: Supple. JVD not elevated. Lungs: Diminished breath sounds throughout. No wheezes, rales, or rhonchi. Breathing is unlabored. Heart: Irregular S1 S2 without murmurs, rubs, or gallops.  Abdomen: Soft, non-distended. Extremities: No clubbing or cyanosis. 1+ pedal edema bilaterally.   Neuro: Alert and oriented X 3. Moves all extremities spontaneously. No focal deficits.  Intake/Output:  Intake/Output Summary (Last 24 hours) at 03/09/14 0728 Last data filed at 03/09/14 0046  Gross per 24 hour  Intake    720 ml  Output    800 ml  Net    -80 ml    Inpatient Medications:  . amiodarone  400 mg Oral BID  . budesonide-formoterol  2 puff Inhalation BID  . dabigatran  150 mg Oral Q12H  . dextromethorphan-guaiFENesin  1 tablet Oral BID  . diltiazem  180 mg Oral Daily  . docusate sodium  100 mg Oral BID  . furosemide  40 mg Oral BID  . insulin aspart  0-9 Units Subcutaneous TID AC & HS  . insulin aspart  3 Units Subcutaneous TID WC  . insulin glargine  5 Units Subcutaneous QHS  . metoprolol tartrate  50 mg Oral BID  . pantoprazole  40 mg Oral Q1200  . potassium chloride  20 mEq Oral Daily  . tiotropium  18 mcg Inhalation Daily    Labs:  Recent Labs  03/07/14 0413 03/08/14 0500  NA 138 134*  K 4.1 4.4  CL 96 94*  CO2 28 26  GLUCOSE 137* 235*  BUN 27* 28*  CREATININE 1.20* 1.24*  CALCIUM 9.0 9.1    Recent Labs  03/07/14 0413 03/08/14 0500  WBC  10.8* 11.4*  HGB 8.0* 8.1*  HCT 26.2* 26.8*  MCV 88.2 88.4  PLT 309 287    Radiology/Studies: Dg Chest 2 View  03/06/2014   CLINICAL DATA:  Hypoxia.  EXAM: CHEST  2 VIEW  COMPARISON:  DG CHEST 1V PORT dated 03/01/2014  FINDINGS: Cardiomegaly with pulmonary vascular prominence and interstitial prominence noted with mild pleural effusions. These findings have improved slightly from prior exam. No acute bony abnormality identified.  IMPRESSION: Congestive heart failure with pulmonary interstitial edema and bilateral pleural effusions. These findings have improved from prior study of 03/01/2014.   Electronically Signed   By: Marcello Moores  Register   On: 03/06/2014 14:26   Dg Chest Port 1 View  03/01/2014   CLINICAL DATA:  COPD  EXAM: PORTABLE CHEST - 1 VIEW  COMPARISON:  02/28/2014  FINDINGS: Stable cardiomegaly with mild interstitial edema pattern and basilar atelectasis, worse on the left. No enlarging effusion or pneumothorax. Trachea midline. Atherosclerosis of the aorta.  IMPRESSION: Stable mild edema pattern.   Electronically Signed   By: Daryll Brod M.D.   On: 03/01/2014 08:09    Telemetry: atrial fibrillation with V rate 115-120 bpm   Assessment and Plan  1. Atrial fibrillation with difficult rate  control - continue amiodarone loading and plan for DCCV in 2-3 days - if fails cardioversion, consider PPM implant +AV node ablation 2. Acute on chronic diastolic HF 3. Acute on chronic respiratory failure  4. COPD 5. Obesity  Signed, Azzie Roup Reyli Schroth PA-C

## 2014-03-09 NOTE — Progress Notes (Signed)
Physical Therapy Treatment Patient Details Name: Paige Hardin MRN: [id number] DOB: Mar 31, 1946 Today's Date: 03/09/2014    History of Present Illness The patient is 68 yo lady with PMH of COPD and dCHF with EF of 60% (2D echo on 01/15/14), who was admitted due to combined COPD and CHF exacerbation on 02/24/14. Pt intubated 3/31 and extubated 4/1    PT Comments    Pt progressing towards physical therapy goals. Fear of falling is limiting pt's ability to improve gait distance, as pt states she feels as if her LE's are going to give out on her. Instructed pt in HEP and discussed breathing techniques to perform during mobility. Overall, pt states she is feeling better. +2 assist for management of equipment during session. Will continue to follow.   Follow Up Recommendations  SNF     Equipment Recommendations  None recommended by PT    Recommendations for Other Services       Precautions / Restrictions Precautions Precautions: Fall Restrictions Weight Bearing Restrictions: No    Mobility  Bed Mobility               General bed mobility comments: not assessed. pt in chair upon arrival  Transfers Overall transfer level: Needs assistance Equipment used: Rolling walker (2 wheeled) Transfers: Sit to/from Stand Sit to Stand: Supervision         General transfer comment: VC's for hand placement on seated surface for safety, especially to control descent to chair.   Ambulation/Gait Ambulation/Gait assistance: Min guard Ambulation Distance (Feet): 75 Feet Assistive device: Rolling walker (2 wheeled) Gait Pattern/deviations: Step-through pattern;Decreased stride length;Trunk flexed Gait velocity: Decreased Gait velocity interpretation: Below normal speed for age/gender General Gait Details: VC's for pursed-lip breathing, improved posture, and walker placement close to pt's body. 1 seated rest break 1/2 way through distance. On 2L/min supplemental O2 throughout gait training.     Stairs            Wheelchair Mobility    Modified Rankin (Stroke Patients Only)       Balance Overall balance assessment: Needs assistance Sitting-balance support: Feet supported;No upper extremity supported Sitting balance-Leahy Scale: Good     Standing balance support: Bilateral upper extremity supported Standing balance-Leahy Scale: Poor                      Cognition Arousal/Alertness: Awake/alert Behavior During Therapy: WFL for tasks assessed/performed Overall Cognitive Status: Within Functional Limits for tasks assessed                      Exercises General Exercises - Lower Extremity Long Arc Quad: 10 reps;Both;Strengthening Hip ABduction/ADduction: 10 reps;Both;Strengthening    General Comments        Pertinent Vitals/Pain Pt on 1.5L/min supplemental O2 at rest, and on 2L/min supplemental O2 during gait training. At seated rest break, sats at 88% and pt was able to improve sats with pursed-lip breathing into the mid-90's in less than 30 seconds.     Home Living                      Prior Function            PT Goals (current goals can now be found in the care plan section) Acute Rehab PT Goals Patient Stated Goal: none stated  PT Goal Formulation: With patient Time For Goal Achievement: 03/13/14 Potential to Achieve Goals: Good Progress towards PT goals: Progressing toward goals  Frequency  Min 2X/week    PT Plan Current plan remains appropriate    Co-evaluation             End of Session Equipment Utilized During Treatment: Gait belt;Oxygen Activity Tolerance: Patient tolerated treatment well Patient left: in chair;with call bell/phone within reach;with family/visitor present     Time: 8938-1017 PT Time Calculation (min): 29 min  Charges:  $Gait Training: 23-37 mins                    G Codes:      Jolyn Lent Apr 07, 2014, 2:04 PM  Jolyn Lent, PT, DPT Acute Rehabilitation  Services Pager: [phone]

## 2014-03-09 NOTE — Progress Notes (Signed)
PULMONARY / CRITICAL CARE MEDICINE  Name: Paige Hardin MRN: [id number] DOB: 09/18/1946  BRIEF PATIENT DESCRIPTION: 68 y/o with COPD ( Paige Hardin ) and dCHF admitted 3/31 with acute on chronic respiratory failure secondary to COPD and CHF exacerbation on 02/24/14.  SIGNIFICANT EVENTS / STUDIES:   4/03 - LE venous Doppler >>> neg 4/13 - HR remains in 115-120's range, planned cardioversion  LINES / TUBES: OETT 3/31 >>> 4/1  CULTURES: 3/31  Urine >>> neg 3/31  Blood >>> neg  ANTIBIOTICS: Vanco 3/31 >>> 4/1 Zosyn 3/31 >>> 4/1 Levaquin 4/4 >>>4/7  INTERVAL HISTORY:   HR remains 115-120's.  Planning for cardioversion in 2-3 days.    VITAL SIGNS: Temp:  [98 F (36.7 C)-98.3 F (36.8 C)] 98.1 F (36.7 C) (04/13 0622) Pulse Rate:  [63-139] 97 (04/13 0622) Resp:  [18-22] 18 (04/13 0622) BP: (107-119)/(68-97) 107/68 mmHg (04/13 0622) SpO2:  [93 %-97 %] 97 % (04/13 0622) Weight:  [290 lb 11.2 oz (131.861 kg)] 290 lb 11.2 oz (131.861 kg) (04/13 0622)  PHYSICAL EXAMINATION: General: no acute distress, morbidly obese, ambulating in room  HEENT: NCAT, OP clear PULM: few crackles in bases CV: Irreg irreg, tachy, no murmur AB: BS+, soft, nontedner Ext: warm, 1-2+ pretibial edema, bruising to bilateral forearms  Neuro: A&Ox4, MAEW  LABS: CBC  Recent Labs Lab 03/07/14 0413 03/08/14 0500  WBC 10.8* 11.4*  HGB 8.0* 8.1*  HCT 26.2* 26.8*  PLT 309 287   BMET  Recent Labs Lab 03/07/14 0413 03/08/14 0500 03/09/14 0624  NA 138 134* 135*  K 4.1 4.4 4.5  CL 96 94* 95*  CO2 28 26 25   BUN 27* 28* 35*  CREATININE 1.20* 1.24* 1.47*  GLUCOSE 137* 235* 233*   Electrolytes  Recent Labs Lab 03/07/14 0413 03/08/14 0500 03/09/14 0624  CALCIUM 9.0 9.1 9.2   Glucose  Recent Labs Lab 03/07/14 2128 03/08/14 0601 03/08/14 1121 03/08/14 1638 03/08/14 2136 03/09/14 0544  GLUCAP 214* 233* 287* 246* 169* 230*   IMAGING: No results found.    ASSESSMENT AND PLAN:  Acute on  chronic respiratory failure - Resolved.  Plan: Wean O2 to off, assess ambulatory desaturation prior to discharge to decide ned for home O2  Pulmonary hygiene, mobilize Follow up w Dr. Gwenette Greet as outpatient (arranged) for chronic respiratory failure, COPD & suspected OSA/OHS  Chronic obstructive pulmonary disease with exacerbation  Plan: Continue Xopenex, Symbicort, Spiriva, Mucinex (home meds)    Suspect OSA / OHS  Plan: May benefit form polysomnography as outpatient -esp given refractory a fibn  Acute on chronic diastolic heart failure Acute pulmonary edema  Plan: Lasix 40mg  bid and daily KCL 39meq home dose  AF / RVR - rate  Plan: Cardizem PO>>increase to 360mg /d Metoprolol>>increased to 100/d  4/11 tambocor never resumed >>resume flecainide Pradaxa Planning for Cardioversion in 2-3 days, if fails then PPM Implant + AV node ablation   Hypokalemia  Plan: Supplemental K with lasix Repeat BMP in am  GERD  Plan: Protonix Diet as tolerated  Anemia of chronic disease  Plan: monitor CBC  DM Hyperglycemia  Plan: SSI Increase Lantus to 15 tonight -back on orals on discharge   GLOBAL: -working on SNF bed pending clearance from Cardiology -see pended d/c summary in incomplete section -will transfer primary SVC to Cardiology   Paige Gens, NP-C [name] Pgr: [phone] or [phone]    I have personally obtained history, examined patient, evaluated and interpreted laboratory and imaging results, reviewed medical  records, formulated assessment / plan and placed orders.  Paige Noel MD  03/09/2014, 8:51 AM

## 2014-03-09 NOTE — Care Management Note (Addendum)
  Page 2 of 2   03/12/2014     3:49:24 PM   CARE MANAGEMENT NOTE 03/12/2014  Patient:  Paige Hardin, Paige Hardin   Account Number:  [id number]  Date Initiated:  02/25/2014  Documentation initiated by:  [facility]  Subjective/Objective Assessment:   REadmission from SNF for resp failure - intubated     Action/Plan:   Anticipated DC Date:  03/09/2014   Anticipated DC Plan:  SKILLED NURSING FACILITY  In-house referral  Clinical Social Worker      DC Planning Services  CM consult      Choice offered to / List presented to:             Status of service:  Completed, signed off Medicare Important Message given?   (If response is "NO", the following Medicare IM given date fields will be blank) Date Medicare IM given:   Date Additional Medicare IM given:    Discharge Disposition:  [place]  Per UR Regulation:  Reviewed for med. necessity/level of care/duration of stay  If discussed at [place] of Stay Meetings, dates discussed:   03/03/2014  03/05/2014  03/10/2014  03/12/2014    Comments:  03/09/2014 Social:  From [facility] but requesting new SNF placement with [facility].  SW/Donna Crowder active. Ludwika Rodd RN, BSN, [place], CCM 03/09/2014   Contact:  Coe,Brenda Relative [phone]   [phone]                 Icenogle,Johnny Spouse     [phone]  03/06/14- 1200- Marvetta Gibbons RN, BSN [phone] Pt now off IV cardizem- HR still not controlled- med adjustments- spoke with NP-Brandi- pt most likely will be ready for d/c on monday- as they would like her HR more controlled on po meds prior to discharge and her dyspnea better-- CSW following for SNF placement needs- return to [facility] vs different SNF.  03/03/14- [place] RN, BSN [phone] Pt from [facility]- pt continues on po and IV Cardizem for rate control- rate uncontrolled. Per notes needs AV ablation and pacemaker- pt is considering this and is to let MD know if she would like to proceed.  CSW and CM to continue to follow for d/c needs.  02-25-14 8:40am [place] [phone] Readmitted from SNF - ??? need for higher level at discharge - [place]??

## 2014-03-09 NOTE — Progress Notes (Signed)
Inpatient Diabetes Program Recommendations  AACE/ADA: New Consensus Statement on Inpatient Glycemic Control (2013)  Target Ranges:  Prepandial:   less than 140 mg/dL      Peak postprandial:   less than 180 mg/dL (1-2 hours)      Critically ill patients:  140 - 180 mg/dL   Reason for Assessment: Hyperglycemia  Note: CBG before breakfast greater than 200.  Suggest MD consider increasing Lantus insulin from 5 units to 10 units.  Thank you.  Rosiland Sen S. Marcelline Mates, RN, MSN, CDE Inpatient Diabetes Program, team pager [phone]

## 2014-03-09 NOTE — Progress Notes (Signed)
The patient's VS remained stable overnight and she did not have any complaints of pain.

## 2014-03-09 NOTE — Plan of Care (Signed)
Problem: Phase II Progression Outcomes Goal: Walk in hall or up in chair TID Outcome: Completed/Met Date Met:  03/09/14 UP with PT in hallway today and tolerated without shortness of breath

## 2014-03-10 LAB — GLUCOSE, CAPILLARY
GLUCOSE-CAPILLARY: 250 mg/dL — AB (ref 70–99)
GLUCOSE-CAPILLARY: 229 mg/dL — AB (ref 70–99)
Glucose-Capillary: 346 mg/dL — ABNORMAL HIGH (ref 70–99)
Glucose-Capillary: 268 mg/dL — ABNORMAL HIGH (ref 70–99)

## 2014-03-10 LAB — CBC
HEMATOCRIT: 27.2 % — AB (ref 36.0–46.0)
HEMOGLOBIN: 8.5 g/dL — AB (ref 12.0–15.0)
MCH: 27.2 pg (ref 26.0–34.0)
MCHC: 31.3 g/dL (ref 30.0–36.0)
MCV: 86.9 fL (ref 78.0–100.0)
Platelets: 317 10*3/uL (ref 150–400)
RBC: 3.13 MIL/uL — AB (ref 3.87–5.11)
RDW: 17.2 % — ABNORMAL HIGH (ref 11.5–15.5)
WBC: 10 10*3/uL (ref 4.0–10.5)

## 2014-03-10 LAB — BASIC METABOLIC PANEL
BUN: 38 mg/dL — ABNORMAL HIGH (ref 6–23)
CHLORIDE: 95 meq/L — AB (ref 96–112)
CO2: 26 mEq/L (ref 19–32)
Calcium: 9.2 mg/dL (ref 8.4–10.5)
Creatinine, Ser: 1.4 mg/dL — ABNORMAL HIGH (ref 0.50–1.10)
GFR calc Af Amer: 44 mL/min — ABNORMAL LOW (ref >90)
GFR, EST NON AFRICAN AMERICAN: 38 mL/min — AB (ref >90)
GLUCOSE: 225 mg/dL — AB (ref 70–99)
POTASSIUM: 4.4 meq/L (ref 3.7–5.3)
Sodium: 135 mEq/L — ABNORMAL LOW (ref 137–147)

## 2014-03-10 MED ORDER — SODIUM CHLORIDE 0.9 % IJ SOLN
3.0000 mL | Freq: Two times a day (BID) | INTRAMUSCULAR | Status: DC
  Administered 2014-03-10 – 2014-03-12 (×5): 3 mL via INTRAVENOUS

## 2014-03-10 MED ORDER — SODIUM CHLORIDE 0.9 % IV SOLN: 250.0000 mL | INTRAVENOUS | Status: DC

## 2014-03-10 MED ORDER — SODIUM CHLORIDE 0.9 % IJ SOLN: 3.0000 mL | INTRAMUSCULAR | Status: DC | PRN

## 2014-03-10 NOTE — Progress Notes (Signed)
SUBJECTIVE: The patient is doing well today. She feels that her shortness of breath is improved.  She is having intermittent reflux (on Protonix).   Amiodarone loading started 03-08-14 with plans for DCCV tomorrow.   Labs pending this morning.   CURRENT MEDICATIONS: . amiodarone  400 mg Oral BID  . budesonide-formoterol  2 puff Inhalation BID  . dabigatran  150 mg Oral Q12H  . dextromethorphan-guaiFENesin  1 tablet Oral BID  . diltiazem  180 mg Oral Daily  . docusate sodium  100 mg Oral BID  . furosemide  40 mg Oral BID  . insulin aspart  0-9 Units Subcutaneous TID AC & HS  . insulin aspart  3 Units Subcutaneous TID WC  . insulin glargine  15 Units Subcutaneous QHS  . metoprolol tartrate  50 mg Oral BID  . pantoprazole  40 mg Oral Q1200  . potassium chloride  20 mEq Oral Daily  . tiotropium  18 mcg Inhalation Daily      OBJECTIVE: Physical Exam: Filed Vitals:   03/09/14 0622 03/09/14 1031 03/09/14 1418 03/09/14 2117  BP: 107/68 106/60 97/54 125/87  Pulse: 97 100 113 102  Temp: 98.1 F (36.7 C) 98.2 F (36.8 C) 98.5 F (36.9 C) 98.1 F (36.7 C)  TempSrc: Oral Oral Oral Oral  Resp: 18 18 18 18   Height:      Weight: 290 lb 11.2 oz (131.861 kg)     SpO2: 97% 97% 97% 96%    Intake/Output Summary (Last 24 hours) at 03/10/14 3016 Last data filed at 03/10/14 0152  Gross per 24 hour  Intake    960 ml  Output   1475 ml  Net   -515 ml    Telemetry reveals atrial fibrillation, ventricular rate 100-120  GEN- The patient is overweight appearing, alert and oriented x 3 today.   Head- normocephalic, atraumatic Eyes-  Sclera clear, conjunctiva pink Ears- hearing intact Oropharynx- clear Neck- supple  Lungs- Clear to ausculation bilaterally, normal work of breathing Heart- irregular rate and rhythm  GI- soft, NT, ND, + BS Extremities- no clubbing, cyanosis, + dependant edema Skin- no rash or lesion Psych- euthymic mood, full affect Neuro- strength and sensation are  intact  LABS: Basic Metabolic Panel:  Recent Labs  03/08/14 0500 03/09/14 0624  NA 134* 135*  K 4.4 4.5  CL 94* 95*  CO2 26 25  GLUCOSE 235* 233*  BUN 28* 35*  CREATININE 1.24* 1.47*  CALCIUM 9.1 9.2   CBC:  Recent Labs  03/08/14 0500  WBC 11.4*  HGB 8.1*  HCT 26.8*  MCV 88.4  PLT 287    RADIOLOGY: Dg Chest 2 View 03/06/2014   CLINICAL DATA:  Hypoxia.  EXAM: CHEST  2 VIEW  COMPARISON:  DG CHEST 1V PORT dated 03/01/2014  FINDINGS: Cardiomegaly with pulmonary vascular prominence and interstitial prominence noted with mild pleural effusions. These findings have improved slightly from prior exam. No acute bony abnormality identified.  IMPRESSION: Congestive heart failure with pulmonary interstitial edema and bilateral pleural effusions. These findings have improved from prior study of 03/01/2014.   Electronically Signed   By: Marcello Moores  Register   On: 03/06/2014 14:26   ASSESSMENT AND PLAN:  Principal Problem:   Acute respiratory failure Active Problems:   HYPERLIPIDEMIA-MIXED   COPD with emphysema    CAROTID BRUIT   PAF (paroxysmal atrial fibrillation)   Acute on chronic diastolic congestive heart failure   HTN (hypertension)   Chronic anticoagulation   Pulmonary HTN- pa  53 mmHg Echo 01/15/14   Morbid obesity- BMI 50   Poorly controlled diabetes mellitus   Chronic renal disease, stage III   COPD exacerbation   Hypoxemia  1. Persistent afib Now on amiodarone.  Plans for cardioversion tomorrow are noted Consider switching pradaxa to eliquis if dyspepsia persists If she fails medical therapy with amiodarone, referral for a convergent procedure may not be unreasonable  2. COPD Stable No change required today  3. Obesity Weight loss is necessary for [place] to adequate treat her long term

## 2014-03-10 NOTE — Progress Notes (Signed)
No change in d/c plan. Patient will be admitted to [facility] when medically stable per MD.  Awaiting d/c order. MD- please advise re: tentative d/c date.  Lorie Phenix. [place], [place]

## 2014-03-10 NOTE — Progress Notes (Signed)
The patient did not have any complaints of pain overnight and her VS remained stable.

## 2014-03-10 NOTE — Progress Notes (Signed)
PULMONARY / CRITICAL CARE MEDICINE  Name: Paige Hardin MRN: [id number] DOB: Oct 02, 1946  BRIEF PATIENT DESCRIPTION: 68 y/o with COPD ( [place] ) and dCHF admitted 3/31 with acute on chronic respiratory failure secondary to COPD and CHF exacerbation on 02/24/14.  SIGNIFICANT EVENTS / STUDIES:   4/03 - LE venous Doppler >>> neg 4/13 - HR remains in 115-120's range, planned cardioversion 4/14 HR 102-109 on amio, DCCV pending  LINES / TUBES: OETT 3/31 >>> 4/1  CULTURES: 3/31  Urine >>> neg 3/31  Blood >>> neg  ANTIBIOTICS: Vanco 3/31 >>> 4/1 Zosyn 3/31 >>> 4/1 Levaquin 4/4 >>>4/7  INTERVAL HISTORY:   HR remains 1102-109.  Planning for cardioversion in 2-3 days.    VITAL SIGNS: Temp:  [98.1 F (36.7 C)-98.6 F (37 C)] 98.6 F (37 C) (04/14 0650) Pulse Rate:  [100-113] 109 (04/14 0650) Resp:  [18] 18 (04/14 0650) BP: (97-125)/(54-87) 110/75 mmHg (04/14 0650) SpO2:  [96 %-97 %] 96 % (04/14 0650) Weight:  [132.405 kg (291 lb 14.4 oz)] 132.405 kg (291 lb 14.4 oz) (04/14 0650)  PHYSICAL EXAMINATION: General: no acute distress, morbidly obese, sitting up reading, MO 291 lbs HEENT: NCAT, OP clear PULM: decreased in bases CV: Irreg irreg, tachy, no murmur AB: BS+, soft, nontedner Ext: warm, 1-2+ pretibial edema, bruising to bilateral forearms , multiple areas of ecchymosis  Neuro: A&Ox4, MAEW  LABS: CBC  Recent Labs Lab 03/07/14 0413 03/08/14 0500 03/10/14 0745  WBC 10.8* 11.4* 10.0  HGB 8.0* 8.1* 8.5*  HCT 26.2* 26.8* 27.2*  PLT 309 287 317   BMET  Recent Labs Lab 03/07/14 0413 03/08/14 0500 03/09/14 0624  NA 138 134* 135*  K 4.1 4.4 4.5  CL 96 94* 95*  CO2 28 26 25   BUN 27* 28* 35*  CREATININE 1.20* 1.24* 1.47*  GLUCOSE 137* 235* 233*   Electrolytes  Recent Labs Lab 03/07/14 0413 03/08/14 0500 03/09/14 0624  CALCIUM 9.0 9.1 9.2   Glucose  Recent Labs Lab 03/08/14 1638 03/08/14 2136 03/09/14 0544 03/09/14 1129 03/09/14 1638 03/09/14 2115  GLUCAP  246* 169* 230* 283* 263* 142*   IMAGING: No results found.    ASSESSMENT AND PLAN:  Acute on chronic respiratory failure - Resolved.  Plan: Wean O2 to off as tolerated, assess ambulatory desaturation prior to discharge to decide ned for home O2  Pulmonary hygiene, mobilize Follow up w Dr. Gwenette Greet as outpatient (arranged) for chronic respiratory failure, COPD & suspected OSA/OHS  Chronic obstructive pulmonary disease with exacerbation  Plan: Continue Xopenex, Symbicort, Spiriva, Mucinex (home meds)    Suspect OSA / OHS  Plan: May benefit form polysomnography as outpatient -esp given refractory a fibn  Acute on chronic diastolic heart failure Acute pulmonary edema Moderate pulm htn -RVSP 53 on echo 12/2013  Plan: Per cards  AF / RVR - rate  Plan: Per cards Planning for Cardioversion in 2-3 days, if fails then PPM Implant + AV node ablation    DM Hyperglycemia CBG (last 3)   Recent Labs  03/09/14 1129 03/09/14 1638 03/09/14 2115  GLUCAP 283* 263* 142*      Plan: SSI Increased Lantus to 15 on 4/13 -back on orals on discharge   GLOBAL: -working on SNF bed pending clearance from Cardiology -see pended d/c summary in incomplete section -will transfer primary SVC to Cardiology -PCCM to sign off   [place] Pager [phone] till 3 pm If no answer page [phone]  Independently examined pt, evaluated data &  formulated above care plan with NP who scribed this note & edited by me.  Rigoberto Noel MD 03/10/2014, 8:48 AM

## 2014-03-10 NOTE — Plan of Care (Signed)
Problem: Phase II Progression Outcomes Goal: Fluid volume status improved Outcome: Progressing Lungs sounds have improved and only some wheezing at bases noted.

## 2014-03-11 ENCOUNTER — Encounter (HOSPITAL_COMMUNITY): Payer: Medicare Other | Admitting: Anesthesiology

## 2014-03-11 ENCOUNTER — Encounter (HOSPITAL_COMMUNITY)
Admission: EM | Disposition: A | Discharge status: Skilled Nursing Facility | Payer: Self-pay | Source: Home / Self Care | Attending: Pulmonary Disease

## 2014-03-11 ENCOUNTER — Encounter (HOSPITAL_COMMUNITY): Payer: Self-pay | Admitting: Anesthesiology

## 2014-03-11 ENCOUNTER — Inpatient Hospital Stay (HOSPITAL_COMMUNITY): Payer: Medicare Other | Admitting: Anesthesiology

## 2014-03-11 HISTORY — PX: CARDIOVERSION: SHX1299

## 2014-03-11 LAB — GLUCOSE, CAPILLARY
GLUCOSE-CAPILLARY: 197 mg/dL — AB (ref 70–99)
Glucose-Capillary: 177 mg/dL — ABNORMAL HIGH (ref 70–99)
Glucose-Capillary: 206 mg/dL — ABNORMAL HIGH (ref 70–99)
Glucose-Capillary: 168 mg/dL — ABNORMAL HIGH (ref 70–99)

## 2014-03-11 SURGERY — CARDIOVERSION
Anesthesia: Monitor Anesthesia Care

## 2014-03-11 MED ORDER — FUROSEMIDE 40 MG PO TABS
40.0000 mg | ORAL_TABLET | Freq: Every day | ORAL | Status: DC
  Administered 2014-03-11 – 2014-03-12 (×2): 40 mg via ORAL
  Filled 2014-03-11 (×2): qty 1

## 2014-03-11 MED ORDER — PROPOFOL 10 MG/ML IV BOLUS
INTRAVENOUS | Status: DC | PRN
  Administered 2014-03-11: 70 mg via INTRAVENOUS

## 2014-03-11 MED ORDER — LIDOCAINE HCL (CARDIAC) 20 MG/ML IV SOLN
INTRAVENOUS | Status: DC | PRN
  Administered 2014-03-11: 30 mg via INTRAVENOUS

## 2014-03-11 MED ORDER — SODIUM CHLORIDE 0.9 % IV SOLN
INTRAVENOUS | Status: DC | PRN
  Administered 2014-03-11: 12:00:00 via INTRAVENOUS

## 2014-03-11 NOTE — Transfer of Care (Signed)
Immediate Anesthesia Transfer of Care Note  Patient: Paige Hardin  Procedure(s) Performed: Procedure(s): CARDIOVERSION (N/A)  Patient Location: Nursing Unit  Anesthesia Type:MAC  Level of Consciousness: awake and alert   Airway & Oxygen Therapy: Patient Spontanous Breathing and Patient connected to nasal cannula oxygen  Post-op Assessment: Report given to PACU RN and Post -op Vital signs reviewed and stable  Post vital signs: Reviewed and stable  Complications: No apparent anesthesia complications

## 2014-03-11 NOTE — Op Note (Signed)
Procedure: Electrical Cardioversion Indications:  Atrial Fibrillation  Procedure Details:  Consent: Risks of procedure as well as the alternatives and risks of each were explained to the (patient/caregiver).  Consent for procedure obtained.  Time Out: Verified patient identification, verified procedure, site/side was marked, verified correct patient position, special equipment/implants available, medications/allergies/relevent history reviewed, required imaging and test results available.  Performed  Patient placed on cardiac monitor, pulse oximetry, supplemental oxygen as necessary.  Sedation given: 70mg  propofol IV, Anesthesiology Pacer pads placed anterior and posterior chest.  Cardioverted 2 time(s).  Cardioverted at biphasic synchronized 150J unsuccessfully, repeated successfully at [place].  Evaluation: Findings: Post procedure EKG shows: NSR Complications: None Patient did tolerate procedure well.  Time Spent Directly with the Patient:  30 minutes   Paige Hardin 03/11/2014, 11:55 AM

## 2014-03-11 NOTE — Anesthesia Preprocedure Evaluation (Addendum)
Anesthesia Evaluation  Patient identified by MRN, date of birth, ID band Patient awake    Reviewed: Allergy & Precautions, H&P , NPO status , Patient's Chart, lab work & pertinent test results  Airway Mallampati: II  Neck ROM: full    Dental  (+) Teeth Intact, Dental Advisory Given   Pulmonary COPD COPD inhaler, former smoker,          Cardiovascular hypertension, Pt. on medications + Peripheral Vascular Disease and +CHF + dysrhythmias Atrial Fibrillation     Neuro/Psych Depression    GI/Hepatic GERD-  ,  Endo/Other  diabetesMorbid obesity  Renal/GU Renal InsufficiencyRenal disease     Musculoskeletal  (+) Arthritis -,   Abdominal   Peds  Hematology   Anesthesia Other Findings   Reproductive/Obstetrics                          Anesthesia Physical Anesthesia Plan  ASA: III  Anesthesia Plan: MAC   Post-op Pain Management:    Induction: Intravenous  Airway Management Planned: Mask  Additional Equipment:   Intra-op Plan:   Post-operative Plan:   Informed Consent: I have reviewed the patients History and Physical, chart, labs and discussed the procedure including the risks, benefits and alternatives for the proposed anesthesia with the patient or authorized representative who has indicated his/her understanding and acceptance.   Dental advisory given  Plan Discussed with: CRNA, Anesthesiologist and Surgeon  Anesthesia Plan Comments:        Anesthesia Quick Evaluation

## 2014-03-11 NOTE — Anesthesia Postprocedure Evaluation (Signed)
Anesthesia Post Note  Patient: Paige Hardin  Procedure(s) Performed: Procedure(s) (LRB): CARDIOVERSION (N/A)  Anesthesia type: MAC  Patient location: PACU  Post pain: Pain level controlled and Adequate analgesia  Post assessment: Post-op Vital signs reviewed, Patient's Cardiovascular Status Stable and Respiratory Function Stable  Last Vitals:  Filed Vitals:   03/11/14 1111  BP: 105/85  Pulse: 120  Temp:   Resp:     Post vital signs: Reviewed and stable  Level of consciousness: awake, alert  and oriented  Complications: No apparent anesthesia complications

## 2014-03-11 NOTE — Progress Notes (Signed)
Per MD- plan d/c to SNF tomorrow at [facility]. Notified Claiborne Billings- Admissions at [facility].  Bed will be available until tomorrow. Will notify patient and her relative Paige Hardin of above.  .[email or web address]

## 2014-03-11 NOTE — Interval H&P Note (Signed)
History and Physical Interval Note:  03/11/2014 11:54 AM  Paige Hardin  has presented today for surgery, with the diagnosis of Afib  The various methods of treatment have been discussed with the patient and family. After consideration of risks, benefits and other options for treatment, the patient has consented to  Procedure(s): CARDIOVERSION (N/A) as a surgical intervention .  The patient's history has been reviewed, patient examined, no change in status, stable for surgery.  I have reviewed the patient's chart and labs.  Questions were answered to the patient's satisfaction.     Milam Allbaugh

## 2014-03-11 NOTE — H&P (View-Only) (Signed)
Reason for Consult: Recurrent PAF with RVR  Requesting Physician: CCM  HPI: This is a 68 y.o. female with a past medical history significant for morbid obesity, COPD, diastolic CHF, CRI -3. She had a prolonged admission with respiratory failure 2/18-2/27/ 15 and was discharged to SNF in NSR. She was re admitted 01/26/14 with recurrent respiratory failure. During that admission she again had PAF and it was decided to put her on Flecainide 50 mg BID. She again was discharged in NSR. She presented 02/24/14 in respiratory failure and was intubated. On admission her Flecainide was held She was intubated and her QTc was noted to be 495. She was extubated 02/25/14 and her Flecainide has not been resumed. She is in AF with RVR (120) on Diltiazem. We have been asked to see her in consult for PAF. She is extubated and appears relatively comfortable at rest. Her QTc was 529 on 4/3.   PMHx:  Past Medical History  Diagnosis Date  . Chronic low back pain   . Emphysema   . Paroxysmal atrial fibrillation   . COPD (chronic obstructive pulmonary disease)   . Hyperlipidemia   . Diastolic congestive heart failure   . Obese   . Carotid atherosclerosis   . Lung nodules   . Hoarseness, chronic   . Pneumonia 1990's    "once"  . Type 2 diabetes mellitus   . GERD (gastroesophageal reflux disease)   . Arthritis     "joints" (01/14/2014)  . Depression     "maybe" (01/14/2014   Past Surgical History  Procedure Laterality Date  . Esophagogastroduodenoscopy  01/26/2012    Procedure: ESOPHAGOGASTRODUODENOSCOPY (EGD);  Surgeon: Beryle Beams, MD;  Location: [facility];  Service: Endoscopy;  Laterality: N/A;  . Colonoscopy  01/26/2012    Procedure: COLONOSCOPY;  Surgeon: Beryle Beams, MD;  Location: [facility];  Service: Endoscopy;  Laterality: N/A;  . Breast biopsy Left ~ 1980    "solid"  . Breast lumpectomy Left ~ 1980    "removed a mass; it was benign" (01/14/2014)  . Total abdominal hysterectomy  1994     FAMHx: breast cancer, heart disease   SOCHx:  reports that she quit smoking about 5 years ago. Her smoking use included Cigarettes. She has a 135 pack-year smoking history. She has never used smokeless tobacco. She reports that she drinks alcohol. She reports that she does not use illicit drugs.  ALLERGIES: Allergies  Allergen Reactions  . Ace Inhibitors Cough  . Clindamycin Rash    ROS: Pertinent items are noted in HPI. See H&P for complete details  HOME MEDICATIONS: Prescriptions prior to admission  Medication Sig Dispense Refill  . acetaminophen (TYLENOL) 325 MG tablet Take 325 mg by mouth daily as needed for mild pain.       Marland Kitchen albuterol (PROAIR HFA) 108 (90 BASE) MCG/ACT inhaler Inhale 2 puffs into the lungs every 6 (six) hours as needed for shortness of breath.  1 Inhaler  3  . Amino Acids-Protein Hydrolys (FEEDING SUPPLEMENT, PRO-STAT SUGAR FREE 64,) LIQD Take 30 mLs by mouth 2 (two) times daily.      . Ascorbic Acid (VITAMIN C) 500 MG CAPS Take 1 tablet by mouth daily.      . bisacodyl (DULCOLAX) 10 MG suppository Place 1 suppository (10 mg total) rectally daily as needed for moderate constipation.  12 suppository  0  . budesonide-formoterol (SYMBICORT) 80-4.5 MCG/ACT inhaler Inhale 2 puffs into the lungs 2 (two) times daily.  1 Inhaler  5  .  Ca & Phos-Vit D-Mag (CALCIUM) [phone] TABS Take 1 tablet by mouth daily.       . Cholecalciferol (VITAMIN D) 2000 UNITS tablet Take 2,000 Units by mouth daily.      . dabigatran (PRADAXA) 150 MG CAPS capsule Take 1 capsule (150 mg total) by mouth 2 (two) times daily.  60 capsule    . diltiazem (CARDIZEM CD) 300 MG 24 hr capsule Take 1 capsule (300 mg total) by mouth daily.  30 capsule  0  . ezetimibe-simvastatin (VYTORIN) 10-40 MG per tablet Take 1 tablet by mouth at bedtime.       . flecainide (TAMBOCOR) 50 MG tablet Take 1 tablet (50 mg total) by mouth every 12 (twelve) hours.  60 tablet  0  . furosemide (LASIX) 40 MG tablet  Take 1 tablet (40 mg total) by mouth 2 (two) times daily.  60 tablet  0  . glyBURIDE micronized (GLYNASE) 3 MG tablet Take 3 mg by mouth daily.       Marland Kitchen guaiFENesin-codeine 100-10 MG/5ML syrup Take 5 mLs by mouth every 6 (six) hours as needed for cough.  120 mL  0  . ipratropium-albuterol (DUONEB) 0.5-2.5 (3) MG/3ML SOLN Take 3 mLs by nebulization 2 (two) times daily.  360 mL    . lansoprazole (PREVACID) 30 MG capsule Take 30 mg by mouth daily at 12 noon.      . Magnesium-Zinc 133.33-5 MG TABS Take 133 mg by mouth daily.      . Multiple Vitamins-Iron (MULTIVITAMIN/IRON PO) Take 1 tablet by mouth daily.      . potassium chloride SA (K-DUR,KLOR-CON) 20 MEQ tablet Take 20 mEq by mouth daily.       Marland Kitchen tiotropium (SPIRIVA) 18 MCG inhalation capsule Place 18 mcg into inhaler and inhale daily.      . vitamin B-12 (CYANOCOBALAMIN) 500 MCG tablet Take 500 mcg by mouth daily.      . vitamin E 400 UNIT capsule Take 400 Units by mouth daily.        HOSPITAL MEDICATIONS: I have reviewed the patient's current medications.  VITALS: Blood pressure 121/65, pulse 118, temperature 97.8 F (36.6 C), temperature source Oral, resp. rate 17, height _0  (1.651 m), weight 299 lb 13.2 oz (136 kg), SpO2 95.00%.  PHYSICAL EXAM: General appearance: alert, cooperative, no distress and morbidly obese Neck: Rt carotid bruit Lungs: decreased breath sounds Heart: irregularly irregular rhythm Abdomen: obese Extremities: trace edema Pulses: diminnished Skin: pale cool dry Neurologic: Grossly normal  LABS: Results for orders placed during the hospital encounter of 02/24/14 (from the past 48 hour(s))  GLUCOSE, [facility]     Status: Abnormal   Collection Time    02/27/14 12:19 PM      Result Value Ref Range   Glucose-Capillary 205 (*) 70 - 99 mg/dL  TROPONIN I     Status: None   Collection Time    02/27/14 12:20 PM      Result Value Ref Range   Troponin I <0.30  <0.30 ng/mL   Comment:            Due to the  release kinetics of cTnI,     a negative result within the first hours     of the onset of symptoms does not rule out     myocardial infarction with certainty.     If myocardial infarction is still suspected,     repeat the test at appropriate intervals.  RESPIRATORY VIRUS PANEL  Status: None   Collection Time    02/27/14 12:30 PM      Result Value Ref Range   Source - [facility] NASAL SWAB     Comment: CORRECTED ON 04/05 AT 0134: PREVIOUSLY REPORTED AS NASAL SWAB   Respiratory Syncytial Virus A NOT DETECTED     Respiratory Syncytial Virus B NOT DETECTED     Influenza A NOT DETECTED     Influenza B NOT DETECTED     Parainfluenza 1 NOT DETECTED     Parainfluenza 2 NOT DETECTED     Parainfluenza 3 NOT DETECTED     Metapneumovirus NOT DETECTED     Rhinovirus NOT DETECTED     Adenovirus NOT DETECTED     Influenza A H1 NOT DETECTED     Influenza A H3 NOT DETECTED     Comment: (NOTE)           Normal Reference Range for each Analyte: NOT DETECTED     Testing performed using the Luminex xTAG Respiratory Viral Panel test     kit.     This test was developed and its performance characteristics determined     by [employer]. It has not been cleared or approved by the [place]     Food and Drug Administration. This test is used for clinical purposes.     It should not be regarded as investigational or for research. This     laboratory is certified under the [place] (CLIA) as qualified to perform high complexity     clinical laboratory testing.     Performed at [facility], [facility]     Status: Abnormal   Collection Time    02/27/14  3:55 PM      Result Value Ref Range   Glucose-Capillary 248 (*) 70 - 99 mg/dL  GLUCOSE, [facility]     Status: Abnormal   Collection Time    02/27/14  7:13 PM      Result Value Ref Range   Glucose-Capillary 278 (*) 70 - 99 mg/dL  BASIC METABOLIC PANEL     Status: Abnormal   Collection  Time    02/27/14  8:55 PM      Result Value Ref Range   Sodium 140  137 - 147 mEq/L   Potassium 5.1  3.7 - 5.3 mEq/L   Chloride 97  96 - 112 mEq/L   CO2 28  19 - 32 mEq/L   Glucose, Bld 307 (*) 70 - 99 mg/dL   BUN 26 (*) 6 - 23 mg/dL   Creatinine, Ser 0.97  0.50 - 1.10 mg/dL   Calcium 8.9  8.4 - 10.5 mg/dL   GFR calc non Af Amer 59 (*) >90 mL/min   GFR calc Af Amer 68 (*) >90 mL/min   Comment: (NOTE)     The eGFR has been calculated using the CKD EPI equation.     This calculation has not been validated in all clinical situations.     eGFR's persistently <90 mL/min signify possible Chronic Kidney     Disease.  PROCALCITONIN     Status: None   Collection Time    02/27/14  8:55 PM      Result Value Ref Range   Procalcitonin 0.79     Comment:            Interpretation:     PCT > 0.5 ng/mL and <= 2 ng/mL:  Systemic infection (sepsis) is possible,     but other conditions are known to elevate     PCT as well.     (NOTE)             ICU PCT Algorithm               Non ICU PCT Algorithm        ----------------------------     ------------------------------             PCT < 0.25 ng/mL                 PCT < 0.1 ng/mL         Stopping of antibiotics            Stopping of antibiotics           strongly encouraged.               strongly encouraged.        ----------------------------     ------------------------------           PCT level decrease by               PCT < 0.25 ng/mL           >= 80% from peak PCT           OR PCT 0.25 - 0.5 ng/mL          Stopping of antibiotics                                                 encouraged.         Stopping of antibiotics               encouraged.        ----------------------------     ------------------------------           PCT level decrease by              PCT >= 0.25 ng/mL           < 80% from peak PCT            AND PCT >= 0.5 ng/mL            Continuing antibiotics                                                  encouraged.            Continuing antibiotics                encouraged.        ----------------------------     ------------------------------         PCT level increase compared          PCT > 0.5 ng/mL             with peak PCT AND              PCT >= 0.5 ng/mL             Escalation of antibiotics  strongly encouraged.          Escalation of antibiotics            strongly encouraged.  GLUCOSE, [facility]     Status: Abnormal   Collection Time    02/28/14 12:11 AM      Result Value Ref Range   Glucose-Capillary 285 (*) 70 - 99 mg/dL   Comment 1 Documented in Chart     Comment 2 Notify RN    CBC     Status: Abnormal   Collection Time    02/28/14  3:10 AM      Result Value Ref Range   WBC 12.0 (*) 4.0 - 10.5 K/uL   RBC 2.75 (*) 3.87 - 5.11 MIL/uL   Hemoglobin 7.6 (*) 12.0 - 15.0 g/dL   HCT 24.9 (*) 36.0 - 46.0 %   MCV 90.5  78.0 - 100.0 fL   MCH 27.6  26.0 - 34.0 pg   MCHC 30.5  30.0 - 36.0 g/dL   RDW 17.3 (*) 11.5 - 15.5 %   Platelets 254  150 - 400 K/uL  BASIC METABOLIC PANEL     Status: Abnormal   Collection Time    02/28/14  3:10 AM      Result Value Ref Range   Sodium 138  137 - 147 mEq/L   Potassium 4.5  3.7 - 5.3 mEq/L   Chloride 95 (*) 96 - 112 mEq/L   CO2 27  19 - 32 mEq/L   Glucose, Bld 260 (*) 70 - 99 mg/dL   BUN 25 (*) 6 - 23 mg/dL   Creatinine, Ser 0.89  0.50 - 1.10 mg/dL   Calcium 8.6  8.4 - 10.5 mg/dL   GFR calc non Af Amer 65 (*) >90 mL/min   GFR calc Af Amer 75 (*) >90 mL/min   Comment: (NOTE)     The eGFR has been calculated using the CKD EPI equation.     This calculation has not been validated in all clinical situations.     eGFR's persistently <90 mL/min signify possible Chronic Kidney     Disease.  MAGNESIUM     Status: None   Collection Time    02/28/14  3:10 AM      Result Value Ref Range   Magnesium 2.0  1.5 - 2.5 mg/dL  GLUCOSE, [facility]     Status: Abnormal   Collection Time    02/28/14  4:06 AM       Result Value Ref Range   Glucose-Capillary 242 (*) 70 - 99 mg/dL   Comment 1 Documented in Chart     Comment 2 Notify RN    GLUCOSE, [facility]     Status: Abnormal   Collection Time    02/28/14  7:13 AM      Result Value Ref Range   Glucose-Capillary 190 (*) 70 - 99 mg/dL  GLUCOSE, [facility]     Status: Abnormal   Collection Time    02/28/14 11:33 AM      Result Value Ref Range   Glucose-Capillary 239 (*) 70 - 99 mg/dL  GLUCOSE, CAPILL[facility]     Status: Abnormal   Collection Time    02/28/14  3:10 PM      Result Value Ref Range   Glucose-Capillary 390 (*) 70 - 99 mg/dL  GLUCOSE, CAP[facility]     Status: Abnormal   Collection Time    02/28/14  6:36 PM      Result Value Ref Range  Glucose-Capillary 355 (*) 70 - 99 mg/dL  GLUCOSE, CAPILL[facility]     Status: Abnormal   Collection Time    02/28/14  7:50 PM      Result Value Ref Range   Glucose-Capillary 343 (*) 70 - 99 mg/dL   Comment 1 Notify RN    GLUCOSE, [facility]     Status: Abnormal   Collection Time    02/28/14 11:59 PM      Result Value Ref Range   Glucose-Capillary 260 (*) 70 - 99 mg/dL   Comment 1 Notify RN    PRO B NATRIURETIC PEPTIDE     Status: Abnormal   Collection Time    03/01/14  2:56 AM      Result Value Ref Range   Pro B Natriuretic peptide (BNP) 5100.0 (*) 0 - 125 pg/mL  MAGNESIUM     Status: None   Collection Time    03/01/14  2:56 AM      Result Value Ref Range   Magnesium 2.0  1.5 - 2.5 mg/dL  PHOSPHORUS     Status: None   Collection Time    03/01/14  2:56 AM      Result Value Ref Range   Phosphorus 3.1  2.3 - 4.6 mg/dL  BASIC METABOLIC PANEL     Status: Abnormal   Collection Time    03/01/14  2:56 AM      Result Value Ref Range   Sodium 136 (*) 137 - 147 mEq/L   Potassium 4.3  3.7 - 5.3 mEq/L   Chloride 94 (*) 96 - 112 mEq/L   CO2 29  19 - 32 mEq/L   Glucose, Bld 215 (*) 70 - 99 mg/dL   BUN 28 (*) 6 - 23 mg/dL   Creatinine, Ser 1.12 (*) 0.50 - 1.10 mg/dL   Calcium 8.9  8.4 - 10.5 mg/dL   GFR  calc non Af Amer 49 (*) >90 mL/min   GFR calc Af Amer 57 (*) >90 mL/min   Comment: (NOTE)     The eGFR has been calculated using the CKD EPI equation.     This calculation has not been validated in all clinical situations.     eGFR's persistently <90 mL/min signify possible Chronic Kidney     Disease.  CBC WITH DIFFERENTIAL     Status: Abnormal   Collection Time    03/01/14  2:56 AM      Result Value Ref Range   WBC 11.6 (*) 4.0 - 10.5 K/uL   RBC 2.87 (*) 3.87 - 5.11 MIL/uL   Hemoglobin 8.0 (*) 12.0 - 15.0 g/dL   HCT 25.8 (*) 36.0 - 46.0 %   MCV 89.9  78.0 - 100.0 fL   MCH 27.9  26.0 - 34.0 pg   MCHC 31.0  30.0 - 36.0 g/dL   RDW 17.4 (*) 11.5 - 15.5 %   Platelets 264  150 - 400 K/uL   Neutrophils Relative % 86 (*) 43 - 77 %   Neutro Abs 9.9 (*) 1.7 - 7.7 K/uL   Lymphocytes Relative 7 (*) 12 - 46 %   Lymphs Abs 0.8  0.7 - 4.0 K/uL   Monocytes Relative 7  3 - 12 %   Monocytes Absolute 0.8  0.1 - 1.0 K/uL   Eosinophils Relative 0  0 - 5 %   Eosinophils Absolute 0.0  0.0 - 0.7 K/uL   Basophils Relative 0  0 - 1 %   Basophils Absolute 0.0  0.0 -  0.1 K/uL  GLUCOSE, [facility]     Status: Abnormal   Collection Time    03/01/14  4:06 AM      Result Value Ref Range   Glucose-Capillary 166 (*) 70 - 99 mg/dL   Comment 1 Notify RN    GLUCOSE, [facility]     Status: Abnormal   Collection Time    03/01/14  7:29 AM      Result Value Ref Range   Glucose-Capillary 102 (*) 70 - 99 mg/dL    EKG: AF, incomplete RBBB, prolonged QTc  IMAGING: Dg Chest Port 1 View  03/01/2014   CLINICAL DATA:  COPD  EXAM: PORTABLE CHEST - 1 VIEW  COMPARISON:  02/28/2014  FINDINGS: Stable cardiomegaly with mild interstitial edema pattern and basilar atelectasis, worse on the left. No enlarging effusion or pneumothorax. Trachea midline. Atherosclerosis of the aorta.  IMPRESSION: Stable mild edema pattern.   Electronically Signed   By: Daryll Brod M.D.   On: 03/01/2014 08:09   Dg Chest Port 1 View  02/28/2014    CLINICAL DATA:  Respiratory failure.  EXAM: PORTABLE CHEST - 1 VIEW  COMPARISON:  DG CHEST 1V PORT dated 02/27/2014; DG CHEST 1V PORT dated 02/26/2014; DG CHEST 1V PORT dated 01/14/2014  FINDINGS: 0547 hr. There is stable cardiomegaly and aortic atherosclerosis. Interstitial edema has improved over the last 2 days. There is no confluent airspace opacity, significant pleural effusion or pneumothorax. The osseous structures appear unchanged for  IMPRESSION: Improving pulmonary edema with persistent cardiomegaly and residual vascular congestion.   Electronically Signed   By: Camie Patience M.D.   On: 02/28/2014 07:50   Dg Hip Portable 1 View Right  02/27/2014   CLINICAL DATA:  Right hip pain  EXAM: PORTABLE RIGHT HIP - 1 VIEW  COMPARISON:  None.  FINDINGS: Single frontal view the right hip was obtained and reveals no acute fracture dislocation. No gross soft tissue abnormality is noted.  IMPRESSION: No acute abnormality seen.   Electronically Signed   By: Inez Catalina M.D.   On: 02/27/2014 14:56    IMPRESSION: Active Problems:   Acute respiratory failure   Acute on chronic diastolic congestive heart failure   Atrial fibrillation with RVR   COPD exacerbation   COPD with emphysema    PAF (paroxysmal atrial fibrillation)   Pulmonary HTN- pa 53 mmHg Echo 01/15/14   Morbid obesity- BMI 50   Poorly controlled diabetes mellitus   Chronic renal disease, stage III   Hypoxemia   Prolonged Q-T interval on ECG   HYPERLIPIDEMIA-MIXED   CAROTID BRUIT   HTN (hypertension)   Chronic anticoagulation   RECOMMENDATION: See my consultation note  Time Spent Directly with Patient: 45 minutes  Erlene Quan [phone] beeper 03/01/2014, 9:46 AM   EP attending  Patient seen and examined. I have reviewed the findings above. I agree with the above note. Please see my consultation note which follows this for additional information.  Cristopher Peru, M.D.

## 2014-03-11 NOTE — Progress Notes (Signed)
Patient Name: Paige Hardin      SUBJECTIVE: on amio and dabigitran with some dyspepsia yday For anticipated DCCV today   Past Medical History  Diagnosis Date  . Chronic low back pain   . Emphysema   . Paroxysmal atrial fibrillation   . COPD (chronic obstructive pulmonary disease)   . Hyperlipidemia   . Diastolic congestive heart failure   . Obese   . Carotid atherosclerosis   . Lung nodules   . Hoarseness, chronic   . Pneumonia 1990's    "once"  . Type 2 diabetes mellitus   . GERD (gastroesophageal reflux disease)   . Arthritis     "joints" (01/14/2014)  . Depression     "maybe" (01/14/2014    Scheduled Meds:  Scheduled Meds: . amiodarone  400 mg Oral BID  . budesonide-formoterol  2 puff Inhalation BID  . dabigatran  150 mg Oral Q12H  . dextromethorphan-guaiFENesin  1 tablet Oral BID  . diltiazem  180 mg Oral Daily  . docusate sodium  100 mg Oral BID  . furosemide  40 mg Oral BID  . insulin aspart  0-9 Units Subcutaneous TID AC & HS  . insulin aspart  3 Units Subcutaneous TID WC  . insulin glargine  15 Units Subcutaneous QHS  . metoprolol tartrate  50 mg Oral BID  . pantoprazole  40 mg Oral Q1200  . potassium chloride  20 mEq Oral Daily  . sodium chloride  3 mL Intravenous Q12H  . tiotropium  18 mcg Inhalation Daily   Continuous Infusions: . sodium chloride      PHYSICAL EXAM Filed Vitals:   03/10/14 1500 03/10/14 2107 03/10/14 2304 03/11/14 0441  BP: 104/80 113/74 109/68 113/71  Pulse: 120 107 123 97  Temp: 98 F (36.7 C) 98 F (36.7 C)  98.4 F (36.9 C)  TempSrc: Oral Oral  Oral  Resp: 18 18  18   Height:      Weight:    291 lb 4.8 oz (132.133 kg)  SpO2: 96% 98%  96%    Well developed and nourished in no acute distress HENT normal Neck supple   Clear Irregularly irregular rate and rhythm with  rapid  ventricular response, no murmurs or gallops Abd-soft with active BS without hepatomegaly No Clubbing cyanosis Skin-warm and dry A &  Oriented  Grossly normal sensory and motor function   TELEMETRY: Reviewed telemetry pt in afib   Intake/Output Summary (Last 24 hours) at 03/11/14 0726 Last data filed at 03/11/14 0442  Gross per 24 hour  Intake    723 ml  Output   2077 ml  Net  -1354 ml    LABS: Basic Metabolic Panel:  Recent Labs Lab 03/06/14 0248 03/07/14 0413 03/08/14 0500 03/09/14 0624 03/10/14 0745  NA 137 138 134* 135* 135*  K 3.5* 4.1 4.4 4.5 4.4  CL 94* 96 94* 95* 95*  CO2 30 28 26 25 26   GLUCOSE 139* 137* 235* 233* 225*  BUN 23 27* 28* 35* 38*  CREATININE 1.09 1.20* 1.24* 1.47* 1.40*  CALCIUM 8.8 9.0 9.1 9.2 9.2   Cardiac Enzymes: No results found for this basename: CKTOTAL, CKMB, CKMBINDEX, TROPONINI,  in the last 72 hours CBC:  Recent Labs Lab 03/07/14 0413 03/08/14 0500 03/10/14 0745  WBC 10.8* 11.4* 10.0  HGB 8.0* 8.1* 8.5*  HCT 26.2* 26.8* 27.2*  MCV 88.2 88.4 86.9  PLT 309 287 317   PROTIME: No results found for  this basename: LABPROT, INR,  in the last 72 hours Liver Function Tests: No results found for this basename: AST, ALT, ALKPHOS, BILITOT, PROT, ALBUMIN,  in the last 72 hours No results found for this basename: LIPASE, AMYLASE,  in the last 72 hours BNP: BNP (last 3 results)  Recent Labs  02/24/14 2220 02/27/14 0540 03/01/14 0256  PROBNP 1615.0* 3380.0* 5100.0*   D-Dimer: No results found for this basename: DDIMER,  in the last 72 hours Hemoglobin A1C: No results found for this basename: HGBA1C,  in the last 72 hours Fasting Lipid Panel: No results found for this basename: CHOL, HDL, LDLCALC, TRIG, CHOLHDL, LDLDIRECT,  in the last 72 hours Thyroid Function Tests: No results found for this basename: TSH, T4TOTAL, FREET3, T3FREE, THYROIDAB,  in the last 72 hours Anemia Panel: No results found for this basename: VITAMINB12, FOLATE, FERRITIN, TIBC, IRON, RETICCTPCT,  in the last 72 hours   ASSESSMENT AND PLAN:  Principal Problem:   Acute respiratory  failure Active Problems:   HYPERLIPIDEMIA-MIXED   COPD with emphysema    CAROTID BRUIT   PAF (paroxysmal atrial fibrillation)   Acute on chronic diastolic congestive heart failure   HTN (hypertension)   Chronic anticoagulation   Pulmonary HTN- pa 53 mmHg Echo 01/15/14   Morbid obesity- BMI 50   Poorly controlled diabetes mellitus   Chronic renal disease, stage III   COPD exacerbation   Hypoxemia with prerenal azotemia will reduce lasix to daily Hopefully restoration of sinus will help   Signed, Deboraha Sprang MD  03/11/2014

## 2014-03-11 NOTE — Progress Notes (Signed)
Patient: Paige Hardin Date of Encounter: 03/11/2014, 8:10 AM Admit date: 02/24/2014     Subjective  Ms. Wain has no new complaints this AM. She feels her breathing is improved. She denies CP or palpitations. No dyspepsia   Objective  Physical Exam: Vitals: BP 113/71  Pulse 97  Temp(Src) 98.4 F (36.9 C) (Oral)  Resp 18  Ht 5\' 5"  (1.651 m)  Wt 291 lb 4.8 oz (132.133 kg)  BMI 48.47 kg/m2  SpO2 96% General: Well developed, overweight 68 year old female in no acute distress. Neck: Supple. JVD not elevated. Lungs: Diminished breath sounds throughout. No wheezes, rales, or rhonchi. Breathing is unlabored. Heart: Irregular S1 S2 without murmurs, rubs, or gallops.  Abdomen: Soft, non-distended. Extremities: No clubbing or cyanosis. 1+ pedal edema bilaterally.   Neuro: Alert and oriented X 3. Moves all extremities spontaneously. No focal deficits.  Intake/Output:  Intake/Output Summary (Last 24 hours) at 03/11/14 0810 Last data filed at 03/11/14 0442  Gross per 24 hour  Intake    723 ml  Output   2077 ml  Net  -1354 ml    Inpatient Medications:  . amiodarone  400 mg Oral BID  . budesonide-formoterol  2 puff Inhalation BID  . dabigatran  150 mg Oral Q12H  . dextromethorphan-guaiFENesin  1 tablet Oral BID  . diltiazem  180 mg Oral Daily  . docusate sodium  100 mg Oral BID  . furosemide  40 mg Oral Daily  . insulin aspart  0-9 Units Subcutaneous TID AC & HS  . insulin aspart  3 Units Subcutaneous TID WC  . insulin glargine  15 Units Subcutaneous QHS  . metoprolol tartrate  50 mg Oral BID  . pantoprazole  40 mg Oral Q1200  . potassium chloride  20 mEq Oral Daily  . sodium chloride  3 mL Intravenous Q12H  . tiotropium  18 mcg Inhalation Daily    Labs:  Recent Labs  03/09/14 0624 03/10/14 0745  NA 135* 135*  K 4.5 4.4  CL 95* 95*  CO2 25 26  GLUCOSE 233* 225*  BUN 35* 38*  CREATININE 1.47* 1.40*  CALCIUM 9.2 9.2    Recent Labs  03/10/14 0745  WBC 10.0    HGB 8.5*  HCT 27.2*  MCV 86.9  PLT 317    Radiology/Studies: Dg Chest 2 View  03/06/2014   CLINICAL DATA:  Hypoxia.  EXAM: CHEST  2 VIEW  COMPARISON:  DG CHEST 1V PORT dated 03/01/2014  FINDINGS: Cardiomegaly with pulmonary vascular prominence and interstitial prominence noted with mild pleural effusions. These findings have improved slightly from prior exam. No acute bony abnormality identified.  IMPRESSION: Congestive heart failure with pulmonary interstitial edema and bilateral pleural effusions. These findings have improved from prior study of 03/01/2014.   Electronically Signed   By: Marcello Moores  Register   On: 03/06/2014 14:26   Dg Chest Port 1 View  03/01/2014   CLINICAL DATA:  COPD  EXAM: PORTABLE CHEST - 1 VIEW  COMPARISON:  02/28/2014  FINDINGS: Stable cardiomegaly with mild interstitial edema pattern and basilar atelectasis, worse on the left. No enlarging effusion or pneumothorax. Trachea midline. Atherosclerosis of the aorta.  IMPRESSION: Stable mild edema pattern.   Electronically Signed   By: Daryll Brod M.D.   On: 03/01/2014 08:09    Telemetry: atrial fibrillation with V rate 100-110 bpm currently   Assessment and Plan  1. Atrial fibrillation with difficult rate control - continue amiodarone loading  - for  DCCV today - if fails cardioversion, consider PPM implant +AV node ablation 2. Acute on chronic diastolic HF 3. Acute on chronic respiratory failure  4. COPD 5. Obesity  Signed, Azzie Roup Edmisten PA-C  [facility] tomorrow  On amio 400 bid x 7d 400 daily x 14 d then 200 daily  followup with me/BE 2-3 weeks

## 2014-03-12 ENCOUNTER — Encounter (HOSPITAL_COMMUNITY): Payer: Self-pay | Admitting: Cardiovascular Disease

## 2014-03-12 DIAGNOSIS — I4891 Unspecified atrial fibrillation: Secondary | ICD-10-CM | POA: Diagnosis not present

## 2014-03-12 DIAGNOSIS — J449 Chronic obstructive pulmonary disease, unspecified: Secondary | ICD-10-CM | POA: Diagnosis not present

## 2014-03-12 DIAGNOSIS — J4489 Other specified chronic obstructive pulmonary disease: Secondary | ICD-10-CM | POA: Diagnosis not present

## 2014-03-12 DIAGNOSIS — Z7901 Long term (current) use of anticoagulants: Secondary | ICD-10-CM | POA: Diagnosis not present

## 2014-03-12 DIAGNOSIS — R042 Hemoptysis: Secondary | ICD-10-CM | POA: Diagnosis not present

## 2014-03-12 DIAGNOSIS — J9600 Acute respiratory failure, unspecified whether with hypoxia or hypercapnia: Secondary | ICD-10-CM | POA: Diagnosis present

## 2014-03-12 DIAGNOSIS — Z66 Do not resuscitate: Secondary | ICD-10-CM | POA: Diagnosis present

## 2014-03-12 DIAGNOSIS — Z87891 Personal history of nicotine dependence: Secondary | ICD-10-CM | POA: Diagnosis not present

## 2014-03-12 DIAGNOSIS — K219 Gastro-esophageal reflux disease without esophagitis: Secondary | ICD-10-CM | POA: Diagnosis present

## 2014-03-12 DIAGNOSIS — D649 Anemia, unspecified: Secondary | ICD-10-CM | POA: Diagnosis not present

## 2014-03-12 DIAGNOSIS — Z5189 Encounter for other specified aftercare: Secondary | ICD-10-CM | POA: Diagnosis not present

## 2014-03-12 DIAGNOSIS — R0602 Shortness of breath: Secondary | ICD-10-CM | POA: Diagnosis not present

## 2014-03-12 DIAGNOSIS — F411 Generalized anxiety disorder: Secondary | ICD-10-CM | POA: Diagnosis not present

## 2014-03-12 DIAGNOSIS — J441 Chronic obstructive pulmonary disease with (acute) exacerbation: Secondary | ICD-10-CM | POA: Diagnosis present

## 2014-03-12 DIAGNOSIS — I5033 Acute on chronic diastolic (congestive) heart failure: Secondary | ICD-10-CM | POA: Diagnosis present

## 2014-03-12 DIAGNOSIS — Z4682 Encounter for fitting and adjustment of non-vascular catheter: Secondary | ICD-10-CM | POA: Diagnosis not present

## 2014-03-12 DIAGNOSIS — R935 Abnormal findings on diagnostic imaging of other abdominal regions, including retroperitoneum: Secondary | ICD-10-CM | POA: Diagnosis not present

## 2014-03-12 DIAGNOSIS — I10 Essential (primary) hypertension: Secondary | ICD-10-CM | POA: Diagnosis not present

## 2014-03-12 DIAGNOSIS — I129 Hypertensive chronic kidney disease with stage 1 through stage 4 chronic kidney disease, or unspecified chronic kidney disease: Secondary | ICD-10-CM | POA: Diagnosis present

## 2014-03-12 DIAGNOSIS — I2789 Other specified pulmonary heart diseases: Secondary | ICD-10-CM | POA: Diagnosis present

## 2014-03-12 DIAGNOSIS — D509 Iron deficiency anemia, unspecified: Secondary | ICD-10-CM | POA: Diagnosis present

## 2014-03-12 DIAGNOSIS — E119 Type 2 diabetes mellitus without complications: Secondary | ICD-10-CM | POA: Diagnosis not present

## 2014-03-12 DIAGNOSIS — I509 Heart failure, unspecified: Secondary | ICD-10-CM | POA: Diagnosis not present

## 2014-03-12 DIAGNOSIS — G931 Anoxic brain damage, not elsewhere classified: Secondary | ICD-10-CM | POA: Diagnosis present

## 2014-03-12 DIAGNOSIS — Z6841 Body Mass Index (BMI) 40.0 and over, adult: Secondary | ICD-10-CM | POA: Diagnosis not present

## 2014-03-12 DIAGNOSIS — E782 Mixed hyperlipidemia: Secondary | ICD-10-CM | POA: Diagnosis present

## 2014-03-12 DIAGNOSIS — J438 Other emphysema: Secondary | ICD-10-CM | POA: Diagnosis not present

## 2014-03-12 DIAGNOSIS — M545 Low back pain, unspecified: Secondary | ICD-10-CM | POA: Diagnosis not present

## 2014-03-12 DIAGNOSIS — E875 Hyperkalemia: Secondary | ICD-10-CM | POA: Diagnosis present

## 2014-03-12 DIAGNOSIS — E876 Hypokalemia: Secondary | ICD-10-CM | POA: Diagnosis not present

## 2014-03-12 DIAGNOSIS — J9610 Chronic respiratory failure, unspecified whether with hypoxia or hypercapnia: Secondary | ICD-10-CM | POA: Diagnosis not present

## 2014-03-12 DIAGNOSIS — N183 Chronic kidney disease, stage 3 unspecified: Secondary | ICD-10-CM | POA: Diagnosis not present

## 2014-03-12 DIAGNOSIS — J984 Other disorders of lung: Secondary | ICD-10-CM | POA: Diagnosis not present

## 2014-03-12 DIAGNOSIS — Z79899 Other long term (current) drug therapy: Secondary | ICD-10-CM | POA: Diagnosis not present

## 2014-03-12 DIAGNOSIS — D72829 Elevated white blood cell count, unspecified: Secondary | ICD-10-CM | POA: Diagnosis present

## 2014-03-12 DIAGNOSIS — J90 Pleural effusion, not elsewhere classified: Secondary | ICD-10-CM | POA: Diagnosis not present

## 2014-03-12 LAB — GLUCOSE, CAPILLARY
GLUCOSE-CAPILLARY: 170 mg/dL — AB (ref 70–99)
Glucose-Capillary: 223 mg/dL — ABNORMAL HIGH (ref 70–99)

## 2014-03-12 MED ORDER — FUROSEMIDE 40 MG PO TABS: 40.0000 mg | ORAL_TABLET | Freq: Every day | ORAL | Status: DC

## 2014-03-12 MED ORDER — LEVALBUTEROL HCL 0.63 MG/3ML IN NEBU: 0.6300 mg | INHALATION_SOLUTION | Freq: Four times a day (QID) | RESPIRATORY_TRACT | Status: DC | PRN

## 2014-03-12 MED ORDER — METOPROLOL TARTRATE 50 MG PO TABS: 50.0000 mg | ORAL_TABLET | Freq: Two times a day (BID) | ORAL | Status: AC

## 2014-03-12 MED ORDER — AMIODARONE HCL 200 MG PO TABS: ORAL_TABLET | ORAL | Status: DC

## 2014-03-12 MED ORDER — DILTIAZEM HCL ER COATED BEADS 180 MG PO CP24: 180.0000 mg | ORAL_CAPSULE | Freq: Every day | ORAL | Status: DC

## 2014-03-12 MED ORDER — LEVALBUTEROL TARTRATE 45 MCG/ACT IN AERO: 2.0000 | INHALATION_SPRAY | Freq: Four times a day (QID) | RESPIRATORY_TRACT | Status: DC | PRN

## 2014-03-12 NOTE — Progress Notes (Signed)
    Patient: GEETA DWORKIN Date of Encounter: 03/12/2014, 7:38 AM Admit date: 02/24/2014     Subjective  Ms. Pare has no complaints this AM. She feels better in SR. She reports her breathing is improved.   Objective  Physical Exam: Vitals: BP 114/44  Pulse 66  Temp(Src) 97.9 F (36.6 C) (Oral)  Resp 20  Ht 5\' 5"  (1.651 m)  Wt 293 lb 14.4 oz (133.312 kg)  BMI 48.91 kg/m2  SpO2 98% General: Well developed, overweight 68 year old female in no acute distress. Neck: Supple. JVD not elevated. Lungs: Diminished breath sounds throughout. No wheezes, rales, or rhonchi. Breathing is unlabored. Heart: Regular S1 S2 without murmurs, rubs, or gallops.  Abdomen: Soft, non-distended. Extremities: No clubbing or cyanosis. 1+ pedal edema bilaterally.   Neuro: Alert and oriented X 3. Moves all extremities spontaneously. No focal deficits.  Intake/Output:  Intake/Output Summary (Last 24 hours) at 03/12/14 0738 Last data filed at 03/11/14 2321  Gross per 24 hour  Intake    700 ml  Output   1401 ml  Net   -701 ml    Inpatient Medications:  . amiodarone  400 mg Oral BID  . budesonide-formoterol  2 puff Inhalation BID  . dabigatran  150 mg Oral Q12H  . dextromethorphan-guaiFENesin  1 tablet Oral BID  . diltiazem  180 mg Oral Daily  . docusate sodium  100 mg Oral BID  . furosemide  40 mg Oral Daily  . insulin aspart  0-9 Units Subcutaneous TID AC & HS  . insulin aspart  3 Units Subcutaneous TID WC  . insulin glargine  15 Units Subcutaneous QHS  . metoprolol tartrate  50 mg Oral BID  . pantoprazole  40 mg Oral Q1200  . potassium chloride  20 mEq Oral Daily  . sodium chloride  3 mL Intravenous Q12H  . tiotropium  18 mcg Inhalation Daily    Labs:  Recent Labs  03/10/14 0745  NA 135*  K 4.4  CL 95*  CO2 26  GLUCOSE 225*  BUN 38*  CREATININE 1.40*  CALCIUM 9.2    Recent Labs  03/10/14 0745  WBC 10.0  HGB 8.5*  HCT 27.2*  MCV 86.9  PLT 317    Radiology/Studies: Dg  Chest 2 View  03/06/2014   CLINICAL DATA:  Hypoxia.  EXAM: CHEST  2 VIEW  COMPARISON:  DG CHEST 1V PORT dated 03/01/2014  FINDINGS: Cardiomegaly with pulmonary vascular prominence and interstitial prominence noted with mild pleural effusions. These findings have improved slightly from prior exam. No acute bony abnormality identified.  IMPRESSION: Congestive heart failure with pulmonary interstitial edema and bilateral pleural effusions. These findings have improved from prior study of 03/01/2014.   Electronically Signed   By: Marcello Moores  Register   On: 03/06/2014 14:26   Telemetry: SR   Assessment and Plan  1. Atrial fibrillation with difficult rate control - continue amiodarone loading (amio 400 bid x 7d, 400 daily x 14 d then 200 daily)  - s/p DCCV yesterday which was successful for restoring SR - stable for DC to SNF today and will arrange follow-up in 2-3 weeks 2. Acute on chronic diastolic HF 3. Acute on chronic respiratory failure  4. COPD 5. Obesity  Signed, Andrez Grime PA-C  EP Attending Patient seen and examined. Agree with above. She has maintained NSR on amio. Cable for discharge home.  Mikle Bosworth.D.

## 2014-03-12 NOTE — Discharge Summary (Addendum)
CARDIOLOGY DISCHARGE SUMMARY   Patient ID: Paige Hardin,  MRN: [id number], DOB/AGE: 04-16-1946 68 y.o.  Admit date: 02/24/2014 Discharge date: 03/12/2014  Primary Care Physician: Juanita Craver, MD Primary Cardiologist: Aundra Dubin, MD Primary EP: Caryl Comes, MD Primary Pulmonologist: Gwenette Greet, MD  Primary Discharge Diagnoses:  Acute on chronic respiratory failure  Severe COPD with acute exacerbation  Acute on chronic diastolic heart failure  Atrial fibrillation with RVR  Hypokalemia   Secondary Discharge Diagnoses:  Anemia of chronic disease Diabetes mellitus Morbid obesity Lung nodules GERD Osteoarthritis Chronic back pain  Procedures This Admission:  1. Blood cultures x 2 (02/24/2014) - negative 2. Urine culture (02/24/2014) - negative 3. LE venous Doppler (02/27/2014) - negative 4. Direct current cardioversion (03/11/2014) - successful for restoring SR  History and Hospital Course:  [name] is a 68 year old woman with morbid obesity, severe COPD, lung nodules, DM, chronic diastolic HF and paroxysmal atrial fibrillation who was admitted on 02/24/14 with worsening shortness of breath. Over the last 3 months, she has had multiple admissions with acute onset respiratory failure due to a combination of diastolic heart failure and COPD, all exacerbated by atrial fibrillation with a rapid ventricular response. She has been on antiarrhythmic drug therapy with flecainide, but has been noted to have prolongation of her QT interval despite relatively low-dose flecainide. Previously she was not on amiodarone due to chronic lung disease. However, her ventricular rates and atrial fibrillation have been very difficult to control despite multiple medications. On intravenous Cardizem, her rates have been over 110 beats per minute. She has chronic peripheral edema. She has a very long-standing history of tobacco abuse, but stopped smoking approximately 5 years ago.  On the day of admission, Paige Hardin was treated with multiple albuterol treatments throughout the day at [facility] without relief of symptoms. EMS was activated. En route, she was treated with 125 mg of solumedrol, 2gm magnesium and placed the patient on CPAP. Saturations were in upper 80s on CPAP. She initially improved with non-invasive support. On arrival to [facility], she became obtunded and required O2 per bag-valve mask per EMS. Prior to her change in mental status, EMS reported that she stated she wanted to be intubated "if it was necessary" and she was intubated in [facility] ED for respiratory failure. She was admitted by PCCM.   Initial CXR was consistent with pulmonary edema. Paige Hardin had mild elevation of WBC on admit, thought related to administration of IV steroids. She was admitted to the ICU, treated with IV steroids, nebulized bronchodilators, IV diuresis, fluid restriction & empiric antibiotics. She was quickly liberated from mechanical ventilation. Cardiology was consulted for atrial fibrillation / CHF exacerbation management. Antibiotics were discontinued on 02/25/2014 as CXR findings on admission thought related to acute pulmonary edema in setting of HF exacerbation. Cardiology assumed care on 03/09/2014. Hospital course was prolonged secondary to difficult to control atrial fibrillation with RVR despite multiple therapies. Ultimately she required initiation of AAD with amiodarone. After 4 days of amiodarone loading, she then underwent DCCV on 03/11/2014 which was successful for restoring SR. She tolerated this procedure well without complication. She feels better in SR. She reports her SOB is back to baseline. On telemetry she is maintaining SR. She remains in SR, hemodynamically stable and is ambulating without difficulty. She has been seen, examined and deemed stable for discharge back to SNF today by Dr. Cristopher Peru. She will follow-up as scheduled with Dr. Aundra Dubin on 03/23/2014 and with Dr. Gwenette Greet on 04/07/2014. She  will need a repeat  BMET done at SNF in one week to follow renal function and potassium.  Discharge Vitals: Blood pressure 114/44, pulse 66, temperature 97.9 F (36.6 C), temperature source Oral, resp. rate 20, height 5\' 5"  (1.651 m), weight 293 lb 14.4 oz (133.312 kg), SpO2 98.00%.   Labs: Lab Results  Component Value Date   WBC 10.0 03/10/2014   HGB 8.5* 03/10/2014   HCT 27.2* 03/10/2014   MCV 86.9 03/10/2014   PLT 317 03/10/2014     Recent Labs Lab 03/10/14 0745  NA 135*  K 4.4  CL 95*  CO2 26  BUN 38*  CREATININE 1.40*  CALCIUM 9.2  GLUCOSE 225*   Lab Results  Component Value Date   CKTOTAL 140 01/05/2009   CKMB 2.2 01/05/2009   TROPONINI <0.30 02/27/2014     Disposition:  The patient is being discharged in stable condition.  Follow-up:     Follow-up Information   Follow up with Loralie Champagne, MD On 03/23/2014. (At 3:00 PM)    Specialty:  Cardiology   Contact information:   [address] [place] [address] [phone]       Follow up with Kathee Delton, MD On 04/07/2014. (At 3:00 PM)    Specialty:  Pulmonary Disease   Contact information:   [address] [place] [address] [phone]      Discharge Medications:    Medication List    STOP taking these medications       albuterol 108 (90 BASE) MCG/ACT inhaler  Commonly known as:  PROAIR HFA     flecainide 50 MG tablet  Commonly known as:  TAMBOCOR     ipratropium-albuterol 0.5-2.5 (3) MG/3ML Soln  Commonly known as:  DUONEB     vitamin E 400 UNIT capsule      TAKE these medications       acetaminophen 325 MG tablet  Commonly known as:  TYLENOL  Take 325 mg by mouth daily as needed for mild pain.     amiodarone 200 MG tablet  Commonly known as:  PACERONE  Take 2 tabs (400 mg) twice daily for 7 days, then take 2 tabs (400 mg) once daily for 14 days then take 1 tab (200 mg) once daily thereafter     bisacodyl 10 MG suppository  Commonly known as:  DULCOLAX  Place 1 suppository (10 mg  total) rectally daily as needed for moderate constipation.     budesonide-formoterol 80-4.5 MCG/ACT inhaler  Commonly known as:  SYMBICORT  Inhale 2 puffs into the lungs 2 (two) times daily.     Calcium 333-80-133-133 Tabs  Take 1 tablet by mouth daily.     dabigatran 150 MG Caps capsule  Commonly known as:  PRADAXA  Take 1 capsule (150 mg total) by mouth 2 (two) times daily.     diltiazem 180 MG 24 hr capsule  Commonly known as:  CARDIZEM CD  Take 1 capsule (180 mg total) by mouth daily.     ezetimibe-simvastatin 10-40 MG per tablet  Commonly known as:  VYTORIN  Take 1 tablet by mouth at bedtime.     feeding supplement (PRO-STAT SUGAR FREE 64) Liqd  Take 30 mLs by mouth 2 (two) times daily.     furosemide 40 MG tablet  Commonly known as:  LASIX  Take 1 tablet (40 mg total) by mouth daily.     glyBURIDE micronized 3 MG tablet  Commonly known as:  GLYNASE  Take 3 mg by mouth daily.     guaiFENesin-codeine 100-10 MG/5ML syrup  Take 5 mLs by mouth every 6 (six) hours as needed for cough.     lansoprazole 30 MG capsule  Commonly known as:  PREVACID  Take 30 mg by mouth daily at 12 noon.     levalbuterol 0.63 MG/3ML nebulizer solution  Commonly known as:  XOPENEX  Take 3 mLs (0.63 mg total) by nebulization every 6 (six) hours as needed for wheezing or shortness of breath.     levalbuterol 45 MCG/ACT inhaler  Commonly known as:  XOPENEX HFA  Inhale 2 puffs into the lungs every 6 (six) hours as needed for wheezing.     Magnesium-Zinc 133.33-5 MG Tabs  Take 133 mg by mouth daily.     metoprolol 50 MG tablet  Commonly known as:  LOPRESSOR  Take 1 tablet (50 mg total) by mouth 2 (two) times daily.     MULTIVITAMIN/IRON PO  Take 1 tablet by mouth daily.     potassium chloride SA 20 MEQ tablet  Commonly known as:  K-DUR,KLOR-CON  Take 20 mEq by mouth daily.     tiotropium 18 MCG inhalation capsule  Commonly known as:  SPIRIVA  Place 18 mcg into inhaler and inhale  daily.     vitamin B-12 500 MCG tablet  Commonly known as:  CYANOCOBALAMIN  Take 500 mcg by mouth daily.     Vitamin C 500 MG Caps  Take 1 tablet by mouth daily.     Vitamin D 2000 UNITS tablet  Take 2,000 Units by mouth daily.       Duration of Discharge Encounter: Greater than 30 minutes including physician time.  Darrick Huntsman, PA-C 03/12/2014, 8:53 AM  EP Attending  Patient seen and examined. Agree with above history, physical exam, assessment and plan. I have reviewed the findings with Paige Hutchinson, PA-C and we will discharge to SNF and continue amio 400 bid for 2 weeks and then 400 mg daily.  Mikle Bosworth.D.

## 2014-03-13 DIAGNOSIS — I509 Heart failure, unspecified: Secondary | ICD-10-CM | POA: Diagnosis not present

## 2014-03-13 DIAGNOSIS — E119 Type 2 diabetes mellitus without complications: Secondary | ICD-10-CM | POA: Diagnosis not present

## 2014-03-13 DIAGNOSIS — I4891 Unspecified atrial fibrillation: Secondary | ICD-10-CM | POA: Diagnosis not present

## 2014-03-13 DIAGNOSIS — J449 Chronic obstructive pulmonary disease, unspecified: Secondary | ICD-10-CM | POA: Diagnosis not present

## 2014-03-16 ENCOUNTER — Encounter (HOSPITAL_COMMUNITY): Payer: Medicare Other

## 2014-03-17 DIAGNOSIS — J449 Chronic obstructive pulmonary disease, unspecified: Secondary | ICD-10-CM | POA: Diagnosis not present

## 2014-03-17 DIAGNOSIS — F411 Generalized anxiety disorder: Secondary | ICD-10-CM | POA: Diagnosis not present

## 2014-03-17 DIAGNOSIS — I509 Heart failure, unspecified: Secondary | ICD-10-CM | POA: Diagnosis not present

## 2014-03-23 ENCOUNTER — Encounter (HOSPITAL_COMMUNITY): Payer: Self-pay | Admitting: Emergency Medicine

## 2014-03-23 ENCOUNTER — Emergency Department (HOSPITAL_COMMUNITY): Payer: Medicare Other

## 2014-03-23 ENCOUNTER — Ambulatory Visit (INDEPENDENT_AMBULATORY_CARE_PROVIDER_SITE_OTHER): Payer: Medicare Other | Admitting: Cardiology

## 2014-03-23 ENCOUNTER — Inpatient Hospital Stay (HOSPITAL_COMMUNITY)
Admission: EM | Admit: 2014-03-23 | Discharge: 2014-04-03 | DRG: 208 | Disposition: A | Discharge status: Skilled Nursing Facility | Payer: Medicare Other | Attending: Internal Medicine | Admitting: Internal Medicine

## 2014-03-23 ENCOUNTER — Encounter: Payer: Self-pay | Admitting: Cardiology

## 2014-03-23 ENCOUNTER — Inpatient Hospital Stay (HOSPITAL_COMMUNITY): Payer: Medicare Other

## 2014-03-23 VITALS — BP 122/64 | HR 79

## 2014-03-23 DIAGNOSIS — I272 Pulmonary hypertension, unspecified: Secondary | ICD-10-CM | POA: Diagnosis present

## 2014-03-23 DIAGNOSIS — E785 Hyperlipidemia, unspecified: Secondary | ICD-10-CM | POA: Diagnosis present

## 2014-03-23 DIAGNOSIS — D509 Iron deficiency anemia, unspecified: Secondary | ICD-10-CM | POA: Diagnosis not present

## 2014-03-23 DIAGNOSIS — I480 Paroxysmal atrial fibrillation: Secondary | ICD-10-CM

## 2014-03-23 DIAGNOSIS — R4182 Altered mental status, unspecified: Secondary | ICD-10-CM

## 2014-03-23 DIAGNOSIS — I5032 Chronic diastolic (congestive) heart failure: Secondary | ICD-10-CM | POA: Diagnosis present

## 2014-03-23 DIAGNOSIS — I509 Heart failure, unspecified: Secondary | ICD-10-CM | POA: Diagnosis not present

## 2014-03-23 DIAGNOSIS — I4891 Unspecified atrial fibrillation: Secondary | ICD-10-CM | POA: Diagnosis not present

## 2014-03-23 DIAGNOSIS — R279 Unspecified lack of coordination: Secondary | ICD-10-CM | POA: Diagnosis not present

## 2014-03-23 DIAGNOSIS — R52 Pain, unspecified: Secondary | ICD-10-CM | POA: Diagnosis not present

## 2014-03-23 DIAGNOSIS — Z79899 Other long term (current) drug therapy: Secondary | ICD-10-CM | POA: Diagnosis not present

## 2014-03-23 DIAGNOSIS — I5033 Acute on chronic diastolic (congestive) heart failure: Secondary | ICD-10-CM | POA: Diagnosis present

## 2014-03-23 DIAGNOSIS — E1165 Type 2 diabetes mellitus with hyperglycemia: Secondary | ICD-10-CM | POA: Diagnosis present

## 2014-03-23 DIAGNOSIS — Z7901 Long term (current) use of anticoagulants: Secondary | ICD-10-CM | POA: Diagnosis not present

## 2014-03-23 DIAGNOSIS — Z5189 Encounter for other specified aftercare: Secondary | ICD-10-CM | POA: Diagnosis not present

## 2014-03-23 DIAGNOSIS — J441 Chronic obstructive pulmonary disease with (acute) exacerbation: Secondary | ICD-10-CM | POA: Diagnosis not present

## 2014-03-23 DIAGNOSIS — J438 Other emphysema: Secondary | ICD-10-CM

## 2014-03-23 DIAGNOSIS — Z66 Do not resuscitate: Secondary | ICD-10-CM | POA: Diagnosis present

## 2014-03-23 DIAGNOSIS — E782 Mixed hyperlipidemia: Secondary | ICD-10-CM | POA: Diagnosis present

## 2014-03-23 DIAGNOSIS — N183 Chronic kidney disease, stage 3 unspecified: Secondary | ICD-10-CM | POA: Diagnosis not present

## 2014-03-23 DIAGNOSIS — R935 Abnormal findings on diagnostic imaging of other abdominal regions, including retroperitoneum: Secondary | ICD-10-CM | POA: Diagnosis not present

## 2014-03-23 DIAGNOSIS — R042 Hemoptysis: Secondary | ICD-10-CM | POA: Diagnosis not present

## 2014-03-23 DIAGNOSIS — G931 Anoxic brain damage, not elsewhere classified: Secondary | ICD-10-CM | POA: Diagnosis present

## 2014-03-23 DIAGNOSIS — E875 Hyperkalemia: Secondary | ICD-10-CM | POA: Diagnosis present

## 2014-03-23 DIAGNOSIS — J9620 Acute and chronic respiratory failure, unspecified whether with hypoxia or hypercapnia: Secondary | ICD-10-CM | POA: Diagnosis not present

## 2014-03-23 DIAGNOSIS — J811 Chronic pulmonary edema: Secondary | ICD-10-CM

## 2014-03-23 DIAGNOSIS — J90 Pleural effusion, not elsewhere classified: Secondary | ICD-10-CM | POA: Diagnosis not present

## 2014-03-23 DIAGNOSIS — Z4682 Encounter for fitting and adjustment of non-vascular catheter: Secondary | ICD-10-CM | POA: Diagnosis not present

## 2014-03-23 DIAGNOSIS — M549 Dorsalgia, unspecified: Secondary | ICD-10-CM | POA: Diagnosis not present

## 2014-03-23 DIAGNOSIS — K219 Gastro-esophageal reflux disease without esophagitis: Secondary | ICD-10-CM | POA: Diagnosis not present

## 2014-03-23 DIAGNOSIS — J9601 Acute respiratory failure with hypoxia: Secondary | ICD-10-CM | POA: Diagnosis present

## 2014-03-23 DIAGNOSIS — Z87891 Personal history of nicotine dependence: Secondary | ICD-10-CM | POA: Diagnosis not present

## 2014-03-23 DIAGNOSIS — D72829 Elevated white blood cell count, unspecified: Secondary | ICD-10-CM | POA: Diagnosis present

## 2014-03-23 DIAGNOSIS — R911 Solitary pulmonary nodule: Secondary | ICD-10-CM | POA: Diagnosis not present

## 2014-03-23 DIAGNOSIS — J984 Other disorders of lung: Secondary | ICD-10-CM | POA: Diagnosis not present

## 2014-03-23 DIAGNOSIS — R498 Other voice and resonance disorders: Secondary | ICD-10-CM

## 2014-03-23 DIAGNOSIS — R0602 Shortness of breath: Secondary | ICD-10-CM

## 2014-03-23 DIAGNOSIS — I10 Essential (primary) hypertension: Secondary | ICD-10-CM | POA: Diagnosis not present

## 2014-03-23 DIAGNOSIS — I2789 Other specified pulmonary heart diseases: Secondary | ICD-10-CM | POA: Diagnosis not present

## 2014-03-23 DIAGNOSIS — Z6841 Body Mass Index (BMI) 40.0 and over, adult: Secondary | ICD-10-CM | POA: Diagnosis not present

## 2014-03-23 DIAGNOSIS — I129 Hypertensive chronic kidney disease with stage 1 through stage 4 chronic kidney disease, or unspecified chronic kidney disease: Secondary | ICD-10-CM | POA: Diagnosis present

## 2014-03-23 DIAGNOSIS — E559 Vitamin D deficiency, unspecified: Secondary | ICD-10-CM | POA: Diagnosis not present

## 2014-03-23 DIAGNOSIS — E876 Hypokalemia: Secondary | ICD-10-CM | POA: Diagnosis not present

## 2014-03-23 DIAGNOSIS — R079 Chest pain, unspecified: Secondary | ICD-10-CM

## 2014-03-23 DIAGNOSIS — J439 Emphysema, unspecified: Secondary | ICD-10-CM | POA: Diagnosis present

## 2014-03-23 DIAGNOSIS — J9600 Acute respiratory failure, unspecified whether with hypoxia or hypercapnia: Secondary | ICD-10-CM | POA: Diagnosis present

## 2014-03-23 DIAGNOSIS — J9819 Other pulmonary collapse: Secondary | ICD-10-CM | POA: Diagnosis not present

## 2014-03-23 DIAGNOSIS — I2510 Atherosclerotic heart disease of native coronary artery without angina pectoris: Secondary | ICD-10-CM | POA: Diagnosis not present

## 2014-03-23 DIAGNOSIS — M6281 Muscle weakness (generalized): Secondary | ICD-10-CM | POA: Diagnosis not present

## 2014-03-23 DIAGNOSIS — IMO0001 Reserved for inherently not codable concepts without codable children: Secondary | ICD-10-CM | POA: Diagnosis not present

## 2014-03-23 DIAGNOSIS — K5900 Constipation, unspecified: Secondary | ICD-10-CM | POA: Diagnosis not present

## 2014-03-23 DIAGNOSIS — Z452 Encounter for adjustment and management of vascular access device: Secondary | ICD-10-CM | POA: Diagnosis not present

## 2014-03-23 DIAGNOSIS — R0989 Other specified symptoms and signs involving the circulatory and respiratory systems: Secondary | ICD-10-CM | POA: Diagnosis not present

## 2014-03-23 DIAGNOSIS — E119 Type 2 diabetes mellitus without complications: Secondary | ICD-10-CM | POA: Diagnosis present

## 2014-03-23 LAB — BASIC METABOLIC PANEL
BUN: 21 mg/dL (ref 6–23)
CALCIUM: 9 mg/dL (ref 8.4–10.5)
CHLORIDE: 90 meq/L — AB (ref 96–112)
CO2: 27 mEq/L (ref 19–32)
Creatinine, Ser: 1.15 mg/dL — ABNORMAL HIGH (ref 0.50–1.10)
GFR calc Af Amer: 55 mL/min — ABNORMAL LOW (ref >90)
GFR calc non Af Amer: 48 mL/min — ABNORMAL LOW (ref >90)
GLUCOSE: 297 mg/dL — AB (ref 70–99)
POTASSIUM: 5.4 meq/L — AB (ref 3.7–5.3)
SODIUM: 130 meq/L — AB (ref 137–147)

## 2014-03-23 LAB — HEPATIC FUNCTION PANEL
ALK PHOS: 93 U/L (ref 39–117)
ALT: 24 U/L (ref 0–35)
AST: 26 U/L (ref 0–37)
Albumin: 3.3 g/dL — ABNORMAL LOW (ref 3.5–5.2)
BILIRUBIN DIRECT: 0.4 mg/dL — AB (ref 0.0–0.3)
BILIRUBIN INDIRECT: 0.5 mg/dL (ref 0.3–0.9)
Total Bilirubin: 0.9 mg/dL (ref 0.3–1.2)
Total Protein: 7.2 g/dL (ref 6.0–8.3)

## 2014-03-23 LAB — URINALYSIS, ROUTINE W REFLEX MICROSCOPIC
Bilirubin Urine: NEGATIVE
GLUCOSE, UA: NEGATIVE mg/dL
Hgb urine dipstick: NEGATIVE
KETONES UR: NEGATIVE mg/dL
Nitrite: NEGATIVE
PROTEIN: 30 mg/dL — AB
Specific Gravity, Urine: 1.016 (ref 1.005–1.030)
UROBILINOGEN UA: 1 mg/dL (ref 0.0–1.0)
pH: 5 (ref 5.0–8.0)

## 2014-03-23 LAB — I-STAT ARTERIAL BLOOD GAS, ED
Acid-Base Excess: 4 mmol/L — ABNORMAL HIGH (ref 0.0–2.0)
Bicarbonate: 34.7 mEq/L — ABNORMAL HIGH (ref 20.0–24.0)
O2 SAT: 92 %
PH ART: 7.179 — AB (ref 7.350–7.450)
Patient temperature: 98.6
TCO2: 37 mmol/L (ref 0–100)
pCO2 arterial: 93.1 mmHg (ref 35.0–45.0)
pO2, Arterial: 83 mmHg (ref 80.0–100.0)

## 2014-03-23 LAB — POCT I-STAT 3, ART BLOOD GAS (G3+)
ACID-BASE EXCESS: 2 mmol/L (ref 0.0–2.0)
BICARBONATE: 30.9 meq/L — AB (ref 20.0–24.0)
O2 Saturation: 88 %
Patient temperature: 97.5
TCO2: 33 mmol/L (ref 0–100)
pCO2 arterial: 70.2 mmHg (ref 35.0–45.0)
pH, Arterial: 7.249 — ABNORMAL LOW (ref 7.350–7.450)
pO2, Arterial: 63 mmHg — ABNORMAL LOW (ref 80.0–100.0)

## 2014-03-23 LAB — URINE MICROSCOPIC-ADD ON

## 2014-03-23 LAB — CBC
HCT: 27.9 % — ABNORMAL LOW (ref 36.0–46.0)
HEMOGLOBIN: 8.2 g/dL — AB (ref 12.0–15.0)
MCH: 25.6 pg — ABNORMAL LOW (ref 26.0–34.0)
MCHC: 29.4 g/dL — ABNORMAL LOW (ref 30.0–36.0)
MCV: 87.2 fL (ref 78.0–100.0)
PLATELETS: 385 10*3/uL (ref 150–400)
RBC: 3.2 MIL/uL — AB (ref 3.87–5.11)
RDW: 17.8 % — ABNORMAL HIGH (ref 11.5–15.5)
WBC: 17.3 10*3/uL — ABNORMAL HIGH (ref 4.0–10.5)

## 2014-03-23 LAB — CBC WITH DIFFERENTIAL/PLATELET
BASOS PCT: 0 % (ref 0–1)
Basophils Absolute: 0 10*3/uL (ref 0.0–0.1)
Eosinophils Absolute: 0 10*3/uL (ref 0.0–0.7)
Eosinophils Relative: 0 % (ref 0–5)
HEMATOCRIT: 26.4 % — AB (ref 36.0–46.0)
HEMOGLOBIN: 7.7 g/dL — AB (ref 12.0–15.0)
LYMPHS PCT: 2 % — AB (ref 12–46)
Lymphs Abs: 0.3 10*3/uL — ABNORMAL LOW (ref 0.7–4.0)
MCH: 25.6 pg — ABNORMAL LOW (ref 26.0–34.0)
MCHC: 29.2 g/dL — ABNORMAL LOW (ref 30.0–36.0)
MCV: 87.7 fL (ref 78.0–100.0)
MONOS PCT: 2 % — AB (ref 3–12)
Monocytes Absolute: 0.3 10*3/uL (ref 0.1–1.0)
Neutro Abs: 14.7 10*3/uL — ABNORMAL HIGH (ref 1.7–7.7)
Neutrophils Relative %: 96 % — ABNORMAL HIGH (ref 43–77)
Platelets: 286 10*3/uL (ref 150–400)
RBC: 3.01 MIL/uL — AB (ref 3.87–5.11)
RDW: 18 % — ABNORMAL HIGH (ref 11.5–15.5)
SMEAR REVIEW: ADEQUATE
WBC: 15.3 10*3/uL — AB (ref 4.0–10.5)

## 2014-03-23 LAB — PROTIME-INR
INR: 1.7 — ABNORMAL HIGH (ref 0.00–1.49)
PROTHROMBIN TIME: 19.5 s — AB (ref 11.6–15.2)

## 2014-03-23 LAB — MAGNESIUM: Magnesium: 2.4 mg/dL (ref 1.5–2.5)

## 2014-03-23 LAB — GLUCOSE, CAPILLARY: GLUCOSE-CAPILLARY: 298 mg/dL — AB (ref 70–99)

## 2014-03-23 LAB — I-STAT TROPONIN, ED: TROPONIN I, POC: 0.01 ng/mL (ref 0.00–0.08)

## 2014-03-23 LAB — TROPONIN I

## 2014-03-23 LAB — PRO B NATRIURETIC PEPTIDE: PRO B NATRI PEPTIDE: 2475 pg/mL — AB (ref 0–125)

## 2014-03-23 LAB — LACTIC ACID, PLASMA: LACTIC ACID, VENOUS: 0.9 mmol/L (ref 0.5–2.2)

## 2014-03-23 LAB — PHOSPHORUS: Phosphorus: 5 mg/dL — ABNORMAL HIGH (ref 2.3–4.6)

## 2014-03-23 MED ORDER — ACETAMINOPHEN 325 MG PO TABS: 325.0000 mg | ORAL_TABLET | Freq: Every day | ORAL | Status: DC | PRN

## 2014-03-23 MED ORDER — LIDOCAINE HCL (CARDIAC) 20 MG/ML IV SOLN
INTRAVENOUS | Status: AC
  Filled 2014-03-23: qty 5

## 2014-03-23 MED ORDER — EZETIMIBE 10 MG PO TABS
10.0000 mg | ORAL_TABLET | Freq: Every day | ORAL | Status: DC
  Administered 2014-03-23 – 2014-04-02 (×11): 10 mg via ORAL
  Filled 2014-03-23 (×13): qty 1

## 2014-03-23 MED ORDER — VANCOMYCIN HCL 10 G IV SOLR
2000.0000 mg | Freq: Once | INTRAVENOUS | Status: AC
  Administered 2014-03-23: 2000 mg via INTRAVENOUS
  Filled 2014-03-23: qty 2000

## 2014-03-23 MED ORDER — CALCIUM 333-80-133-133 PO TABS: 1.0000 | ORAL_TABLET | Freq: Every day | ORAL | Status: DC

## 2014-03-23 MED ORDER — DEXTROSE 5 % IV SOLN
1.0000 g | Freq: Three times a day (TID) | INTRAVENOUS | Status: AC
  Administered 2014-03-24 – 2014-03-27 (×9): 1 g via INTRAVENOUS
  Filled 2014-03-23 (×9): qty 1

## 2014-03-23 MED ORDER — ETOMIDATE 2 MG/ML IV SOLN
INTRAVENOUS | Status: AC
  Filled 2014-03-23: qty 20

## 2014-03-23 MED ORDER — ALBUTEROL SULFATE (2.5 MG/3ML) 0.083% IN NEBU
5.0000 mg | INHALATION_SOLUTION | Freq: Once | RESPIRATORY_TRACT | Status: AC
  Administered 2014-03-23: 5 mg via RESPIRATORY_TRACT
  Filled 2014-03-23: qty 6

## 2014-03-23 MED ORDER — ATORVASTATIN CALCIUM 20 MG PO TABS
20.0000 mg | ORAL_TABLET | Freq: Every day | ORAL | Status: DC
  Administered 2014-03-23 – 2014-04-02 (×11): 20 mg via ORAL
  Filled 2014-03-23 (×13): qty 1

## 2014-03-23 MED ORDER — DILTIAZEM 12 MG/ML ORAL SUSPENSION
60.0000 mg | Freq: Three times a day (TID) | ORAL | Status: DC
  Administered 2014-03-24 – 2014-03-25 (×4): 60 mg
  Filled 2014-03-23 (×7): qty 6

## 2014-03-23 MED ORDER — ROCURONIUM BROMIDE 50 MG/5ML IV SOLN
INTRAVENOUS | Status: AC
  Filled 2014-03-23: qty 2

## 2014-03-23 MED ORDER — SODIUM CHLORIDE 0.9 % IV SOLN: 250.0000 mL | INTRAVENOUS | Status: DC | PRN

## 2014-03-23 MED ORDER — SODIUM CHLORIDE 0.9 % IV SOLN
250.0000 mL | INTRAVENOUS | Status: DC | PRN
  Administered 2014-03-27: 16:00:00 via INTRAVENOUS

## 2014-03-23 MED ORDER — SUCCINYLCHOLINE CHLORIDE 20 MG/ML IJ SOLN
INTRAMUSCULAR | Status: AC
  Filled 2014-03-23: qty 1

## 2014-03-23 MED ORDER — WHITE PETROLATUM GEL
Status: AC
  Filled 2014-03-23: qty 5

## 2014-03-23 MED ORDER — PROPOFOL 10 MG/ML IV EMUL
5.0000 ug/kg/min | INTRAVENOUS | Status: DC
  Administered 2014-03-24 (×2): 25 ug/kg/min via INTRAVENOUS
  Administered 2014-03-24: 20 ug/kg/min via INTRAVENOUS
  Administered 2014-03-24: 9.531 ug/kg/min via INTRAVENOUS
  Administered 2014-03-25: 20.014 ug/kg/min via INTRAVENOUS
  Administered 2014-03-25: 20 ug/kg/min via INTRAVENOUS
  Filled 2014-03-23 (×6): qty 100

## 2014-03-23 MED ORDER — PROPOFOL 10 MG/ML IV EMUL
5.0000 ug/kg/min | INTRAVENOUS | Status: DC
  Administered 2014-03-23: 20 ug/kg/min via INTRAVENOUS

## 2014-03-23 MED ORDER — ROCURONIUM BROMIDE 50 MG/5ML IV SOLN
INTRAVENOUS | Status: DC | PRN
  Administered 2014-03-23: 100 mg via INTRAVENOUS

## 2014-03-23 MED ORDER — FUROSEMIDE 10 MG/ML IJ SOLN
80.0000 mg | Freq: Two times a day (BID) | INTRAMUSCULAR | Status: DC
  Administered 2014-03-23 – 2014-03-26 (×7): 80 mg via INTRAVENOUS
  Filled 2014-03-23 (×10): qty 8

## 2014-03-23 MED ORDER — DEXTROSE 5 % IV SOLN
2.0000 g | Freq: Once | INTRAVENOUS | Status: AC
  Administered 2014-03-23: 2 g via INTRAVENOUS

## 2014-03-23 MED ORDER — EZETIMIBE-SIMVASTATIN 10-40 MG PO TABS: 1.0000 | ORAL_TABLET | Freq: Every day | ORAL | Status: DC

## 2014-03-23 MED ORDER — LEVALBUTEROL HCL 0.63 MG/3ML IN NEBU
0.6300 mg | INHALATION_SOLUTION | Freq: Four times a day (QID) | RESPIRATORY_TRACT | Status: DC
Start: 2014-03-23 — End: 2014-03-23

## 2014-03-23 MED ORDER — INSULIN ASPART 100 UNIT/ML ~~LOC~~ SOLN
0.0000 [IU] | SUBCUTANEOUS | Status: DC
  Administered 2014-03-23 – 2014-03-24 (×2): 8 [IU] via SUBCUTANEOUS
  Administered 2014-03-24: 11 [IU] via SUBCUTANEOUS
  Administered 2014-03-24: 5 [IU] via SUBCUTANEOUS
  Administered 2014-03-24: 8 [IU] via SUBCUTANEOUS

## 2014-03-23 MED ORDER — IPRATROPIUM-ALBUTEROL 0.5-2.5 (3) MG/3ML IN SOLN
3.0000 mL | Freq: Four times a day (QID) | RESPIRATORY_TRACT | Status: DC
  Administered 2014-03-23 – 2014-03-26 (×11): 3 mL via RESPIRATORY_TRACT
  Filled 2014-03-23 (×11): qty 3

## 2014-03-23 MED ORDER — DILTIAZEM HCL ER COATED BEADS 180 MG PO CP24: 180.0000 mg | ORAL_CAPSULE | Freq: Every day | ORAL | Status: DC

## 2014-03-23 MED ORDER — VITAMIN D 1000 UNITS PO TABS
2000.0000 [IU] | ORAL_TABLET | Freq: Every day | ORAL | Status: DC
  Administered 2014-03-24 – 2014-04-03 (×11): 2000 [IU] via ORAL
  Filled 2014-03-23 (×11): qty 2

## 2014-03-23 MED ORDER — SODIUM CHLORIDE 0.9 % IJ SOLN
3.0000 mL | Freq: Two times a day (BID) | INTRAMUSCULAR | Status: DC
  Administered 2014-03-24 – 2014-04-02 (×7): 3 mL via INTRAVENOUS

## 2014-03-23 MED ORDER — CALCIUM CARBONATE 1250 MG/5ML PO SUSP
500.0000 mg | Freq: Every day | ORAL | Status: DC
  Administered 2014-03-24 – 2014-04-03 (×11): 500 mg via ORAL
  Filled 2014-03-23 (×15): qty 5

## 2014-03-23 MED ORDER — FAMOTIDINE IN NACL 20-0.9 MG/50ML-% IV SOLN
20.0000 mg | Freq: Two times a day (BID) | INTRAVENOUS | Status: DC
  Administered 2014-03-23 – 2014-03-26 (×6): 20 mg via INTRAVENOUS
  Filled 2014-03-23 (×7): qty 50

## 2014-03-23 MED ORDER — PROPOFOL 10 MG/ML IV EMUL
5.0000 ug/kg/min | Freq: Once | INTRAVENOUS | Status: AC
  Administered 2014-03-23: 5 ug/kg/min via INTRAVENOUS

## 2014-03-23 MED ORDER — METOPROLOL TARTRATE 50 MG PO TABS
50.0000 mg | ORAL_TABLET | Freq: Two times a day (BID) | ORAL | Status: DC
  Administered 2014-03-23 – 2014-04-03 (×22): 50 mg via ORAL
  Filled 2014-03-23 (×24): qty 1

## 2014-03-23 MED ORDER — VITAMIN B-12 500 MCG PO TABS
500.0000 ug | ORAL_TABLET | Freq: Every day | ORAL | Status: DC
  Administered 2014-03-24 – 2014-03-27 (×4): 500 ug via ORAL
  Administered 2014-03-28: 10:00:00 via ORAL
  Administered 2014-03-29 – 2014-04-03 (×6): 500 ug via ORAL
  Filled 2014-03-23 (×11): qty 1

## 2014-03-23 MED ORDER — ETOMIDATE 2 MG/ML IV SOLN
INTRAVENOUS | Status: DC | PRN
  Administered 2014-03-23: 30 mg via INTRAVENOUS

## 2014-03-23 MED ORDER — PROPOFOL 10 MG/ML IV EMUL
INTRAVENOUS | Status: AC
  Filled 2014-03-23: qty 100

## 2014-03-23 MED ORDER — MAGNESIUM SULFATE 40 MG/ML IJ SOLN
2.0000 g | Freq: Once | INTRAMUSCULAR | Status: AC
  Administered 2014-03-23: 2 g via INTRAVENOUS
  Filled 2014-03-23: qty 50

## 2014-03-23 MED ORDER — VANCOMYCIN HCL 10 G IV SOLR
1250.0000 mg | Freq: Two times a day (BID) | INTRAVENOUS | Status: AC
  Administered 2014-03-24 – 2014-03-27 (×6): 1250 mg via INTRAVENOUS
  Filled 2014-03-23 (×7): qty 1250

## 2014-03-23 MED ORDER — ALBUTEROL SULFATE (2.5 MG/3ML) 0.083% IN NEBU
5.0000 mg | INHALATION_SOLUTION | RESPIRATORY_TRACT | Status: DC | PRN
  Filled 2014-03-23: qty 6

## 2014-03-23 MED ORDER — METHYLPREDNISOLONE SODIUM SUCC 125 MG IJ SOLR
60.0000 mg | Freq: Three times a day (TID) | INTRAMUSCULAR | Status: DC
  Administered 2014-03-24 – 2014-03-26 (×7): 60 mg via INTRAVENOUS
  Filled 2014-03-23 (×10): qty 0.96

## 2014-03-23 MED ORDER — SODIUM CHLORIDE 0.9 % IJ SOLN: 3.0000 mL | INTRAMUSCULAR | Status: DC | PRN

## 2014-03-23 MED ORDER — DABIGATRAN ETEXILATE MESYLATE 150 MG PO CAPS: 150.0000 mg | ORAL_CAPSULE | Freq: Two times a day (BID) | ORAL | Status: DC

## 2014-03-23 MED ORDER — FUROSEMIDE 10 MG/ML IJ SOLN
40.0000 mg | Freq: Once | INTRAMUSCULAR | Status: AC
  Administered 2014-03-23: 40 mg via INTRAVENOUS
  Filled 2014-03-23: qty 4

## 2014-03-23 MED ORDER — ENOXAPARIN SODIUM 150 MG/ML ~~LOC~~ SOLN
1.0000 mg/kg | Freq: Two times a day (BID) | SUBCUTANEOUS | Status: DC
  Administered 2014-03-24 – 2014-03-25 (×3): 135 mg via SUBCUTANEOUS
  Filled 2014-03-23 (×4): qty 1

## 2014-03-23 MED ORDER — AMIODARONE HCL 200 MG PO TABS
400.0000 mg | ORAL_TABLET | Freq: Every day | ORAL | Status: DC
  Administered 2014-03-23 – 2014-03-25 (×3): 400 mg
  Filled 2014-03-23 (×3): qty 2

## 2014-03-23 MED ORDER — METHYLPREDNISOLONE SODIUM SUCC 125 MG IJ SOLR
80.0000 mg | Freq: Every day | INTRAMUSCULAR | Status: DC
  Administered 2014-03-23: 80 mg via INTRAVENOUS
  Filled 2014-03-23: qty 2

## 2014-03-23 NOTE — Progress Notes (Addendum)
ANTIBIOTIC CONSULT NOTE - INITIAL  Pharmacy Consult for cefepime Indication: pneumonia  Allergies  Allergen Reactions  . Ace Inhibitors Cough  . Clindamycin Rash    Patient Measurements: Height: _0  (175.3 cm) Weight: 293 lb 14 oz (133.3 kg) IBW/kg (Calculated) : 66.2   Vital Signs: BP: 123/50 mmHg (04/27 1755) Pulse Rate: 80 (04/27 1755) Intake/Output from previous day:   Intake/Output from this shift:    Labs:  Recent Labs  03/23/14 1715  WBC 17.3*  HGB 8.2*  PLT 385  CREATININE 1.15*   Estimated Creatinine Clearance: 68.7 ml/min (by C-G formula based on Cr of 1.15). No results found for this basename: VANCOTROUGH, VANCOPEAK, VANCORANDOM, GENTTROUGH, GENTPEAK, GENTRANDOM, TOBRATROUGH, TOBRAPEAK, TOBRARND, AMIKACINPEAK, AMIKACINTROU, AMIKACIN,  in the last 72 hours   Microbiology: Recent Results (from the past 720 hour(s))  CULTURE, BLOOD (ROUTINE X 2)     Status: None   Collection Time    02/24/14 10:40 PM      Result Value Ref Range Status   Specimen Description BLOOD LEFT ARM   Final   Special Requests BOTTLES DRAWN AEROBIC AND ANAEROBIC 10CC   Final   Culture  Setup Time     Final   Value: 02/25/2014 03:33     Performed at [facility]   Culture     Final   Value: NO GROWTH 5 DAYS     Performed at [facility]   Report Status 03/03/2014 FINAL   Final  URINE CULTURE     Status: None   Collection Time    02/24/14 10:45 PM      Result Value Ref Range Status   Specimen Description URINE, CATHETERIZED   Final   Special Requests NONE   Final   Culture  Setup Time     Final   Value: 02/25/2014 00:37     Performed at [facility]     Final   Value: NO GROWTH     Performed at [facility]   Culture     Final   Value: NO GROWTH     Performed at [facility]   Report Status 02/26/2014 FINAL   Final  CULTURE, BLOOD (ROUTINE X 2)     Status: None   Collection Time    02/24/14 10:47 PM      Result  Value Ref Range Status   Specimen Description BLOOD LEFT THUMB   Final   Special Requests BOTTLES DRAWN AEROBIC AND ANAEROBIC 10CC   Final   Culture  Setup Time     Final   Value: 02/25/2014 03:32     Performed at [facility]   Culture     Final   Value: NO GROWTH 5 DAYS     Performed at [facility]   Report Status 03/03/2014 FINAL   Final  CULTURE, RESPIRATORY (NON-EXPECTORATED)     Status: None   Collection Time    02/25/14 12:19 AM      Result Value Ref Range Status   Specimen Description TRACHEAL ASPIRATE   Final   Special Requests NONE   Final   Gram Stain     Final   Value: MODERATE WBC PRESENT,BOTH PMN AND MONONUCLEAR     NO SQUAMOUS EPITHELIAL CELLS SEEN     NO ORGANISMS SEEN     Performed at [facility]   Culture     Final   Value: Non-Pathogenic Oropharyngeal-type Flora Isolated.  Performed at [facility]   Report Status 02/28/2014 FINAL   Final  MRSA PCR SCREENING     Status: None   Collection Time    02/25/14 12:20 AM      Result Value Ref Range Status   MRSA by PCR NEGATIVE  NEGATIVE Final   Comment:            The GeneXpert MRSA Assay (FDA     approved for NASAL specimens     only), is one component of a     comprehensive MRSA colonization     surveillance program. It is not     intended to diagnose MRSA     infection nor to guide or     monitor treatment for     MRSA infections.  RESPIRATORY VIRUS PANEL     Status: None   Collection Time    02/27/14 12:30 PM      Result Value Ref Range Status   Source - RVPAN NASAL SWAB   Corrected   Comment: CORRECTED ON 04/05 AT 0134: PREVIOUSLY REPORTED AS NASAL SWAB   Respiratory Syncytial Virus A NOT DETECTED   Final   Respiratory Syncytial Virus B NOT DETECTED   Final   Influenza A NOT DETECTED   Final   Influenza B NOT DETECTED   Final   Parainfluenza 1 NOT DETECTED   Final   Parainfluenza 2 NOT DETECTED   Final   Parainfluenza 3 NOT DETECTED   Final   Metapneumovirus  NOT DETECTED   Final   Rhinovirus NOT DETECTED   Final   Adenovirus NOT DETECTED   Final   Influenza A H1 NOT DETECTED   Final   Influenza A H3 NOT DETECTED   Final   Comment: (NOTE)           Normal Reference Range for each Analyte: NOT DETECTED     Testing performed using the Luminex xTAG Respiratory Viral Panel test     kit.     This test was developed and its performance characteristics determined     by [facility]. It has not been cleared or approved by the [place]     Food and Drug Administration. This test is used for clinical purposes.     It should not be regarded as investigational or for research. This     laboratory is certified under the [place] (CLIA) as qualified to perform high complexity     clinical laboratory testing.     Performed at [facility] History: Past Medical History  Diagnosis Date  . Chronic low back pain   . Emphysema   . Paroxysmal atrial fibrillation   . COPD (chronic obstructive pulmonary disease)   . Hyperlipidemia   . Diastolic congestive heart failure   . Obese   . Carotid atherosclerosis   . Lung nodules   . Hoarseness, chronic   . Pneumonia 1990's    "once"  . Type 2 diabetes mellitus   . GERD (gastroesophageal reflux disease)   . Arthritis     "joints" (01/14/2014)  . Depression     "maybe" (01/14/2014    Medications:  See home med list Assessment: 68 year old woman to start on cefepime for HCAP (recent hospital admission).  Vancomycin 2g x 1 dose also ordered in ED.  Goal of Therapy:  Vancomycin trough level 15-20 mcg/ml  Plan:  Follow up culture  results Follow renal function Cefepime 2g IV x 1 dose, then 1g IV q8h  Gearldine Bienenstock Kemyra August 03/23/2014,7:00 PM  Addendum: Pradaxa ordered.  Unfortunately, this drug cannot be given per tube.  Asked Dr. Chase Caller about anticoagulation, and received orders for Lovenox 40m/kg q12 starting tomorrow.  Asked to  continue vancomycin.  2g x 1 dose ordered in ED.  Will continue with 12567mIV q12h starting in AM per our nomogram.  JeHeide GuileharmD

## 2014-03-23 NOTE — ED Notes (Signed)
EDP plan of care to intubate patient due to respiratory effort. Pt is still able to follow commands and states that she feels the same. RR approx 30. Lab at bedside. RT made aware.

## 2014-03-23 NOTE — H&P (Signed)
PULMONARY / CRITICAL CARE MEDICINE   Name: Paige Hardin MRN: [id number] DOB: 08-05-1946    ADMISSION DATE:  03/23/2014 CONSULTATION DATE:  03/23/14  REFERRING MD :  ED PRIMARY SERVICE: PCCM  CHIEF COMPLAINT: SOB  BRIEF PATIENT DESCRIPTION:  The patient is a 68 year old woman with morbid obesity, severe COPD, lung nodules, DM-II, chronic diastolic HF (EF 60 % on 2/42/68),  paroxysmal atrial fibrillation and s/p of cardioversion,  who presents with worsening SOB and respiratory failure. Likely due to combinational for CHF and COPD exacerbation. Intubated in ED.    SIGNIFICANT EVENTS / STUDIES:  ETT 4/27>>>  LINES / TUBES: PIV 4/27>>>  CULTURES: Blood culture 4/27>>> Sputum culture 4/27>>> Tracheal aspirate 4/27>> resp virus tracheal aspirat >>4/27  Urine legionella Urine strep   ANTIBIOTICS: Vancomycin 4/27>>>  Cefpime 4/27>>>     HISTORY OF PRESENT ILLNESS:    The patient is a 68 year old woman with morbid obesity, severe COPD, lung nodules, DM-II, chronic diastolic HF (EF 60 % on 3/41/96),  paroxysmal atrial fibrillation and s/p of cardioversion,  who presents with worsening SOB and respiratory failure.   Recently, the patient has had multiple admissions with acute onset respiratory failure due to a combination of diastolic heart failure and COPD, all exacerbated by atrial fibrillation with a rapid ventricular response. She had successful cardioversion on recently admission and discharge to SNF on 03/12/14. Per patient's husband, her conditions improved initially after discharge, but her SOB has been progressively worsening. Her SOB becomes more and more severe and becomes constant. EMS was called and patient was taken to ED.  Of note, patient has productive cough with [place] sputum, but no fever or chills, no chest pain. Patient was found to have acute hypercapnic respiratory failure and intubated in ED. PCCM was called for admission.  Per Ed' note, patient states that she  is DO NOT RESUSCITATE but is okay with intubation if necessary.  PAST MEDICAL HISTORY :  Past Medical History  Diagnosis Date  . Chronic low back pain   . Emphysema   . Paroxysmal atrial fibrillation   . COPD (chronic obstructive pulmonary disease)   . Hyperlipidemia   . Diastolic congestive heart failure   . Obese   . Carotid atherosclerosis   . Lung nodules   . Hoarseness, chronic   . Pneumonia 1990's    "once"  . Type 2 diabetes mellitus   . GERD (gastroesophageal reflux disease)   . Arthritis     "joints" (01/14/2014)  . Depression     "maybe" (01/14/2014   Past Surgical History  Procedure Laterality Date  . Esophagogastroduodenoscopy  01/26/2012    Procedure: ESOPHAGOGASTRODUODENOSCOPY (EGD);  Surgeon: Beryle Beams, MD;  Location: [facility];  Service: Endoscopy;  Laterality: N/A;  . Colonoscopy  01/26/2012    Procedure: COLONOSCOPY;  Surgeon: Beryle Beams, MD;  Location: [facility];  Service: Endoscopy;  Laterality: N/A;  . Breast biopsy Left ~ 1980    "solid"  . Breast lumpectomy Left ~ 1980    "removed a mass; it was benign" (01/14/2014)  . Total abdominal hysterectomy  1994  . Cardioversion N/A 03/11/2014    Procedure: CARDIOVERSION;  Surgeon: Sanda Klein, MD;  Location: [facility] OR;  Service: Cardiovascular;  Laterality: N/A;   Prior to Admission medications   Medication Sig Start Date End Date Taking? Authorizing Provider  acetaminophen (TYLENOL) 325 MG tablet Take 325 mg by mouth daily as needed for mild pain.     Historical  Provider, MD  Amino Acids-Protein Hydrolys (FEEDING SUPPLEMENT, PRO-STAT SUGAR FREE 64,) LIQD Take 30 mLs by mouth 2 (two) times daily.    Historical Provider, MD  amiodarone (PACERONE) 200 MG tablet Take 2 tabs (400 mg) twice daily for 7 days, then take 2 tabs (400 mg) once daily for 14 days then take 1 tab (200 mg) once daily thereafter 03/12/14   Brooke O Edmisten, PA-C  Ascorbic Acid (VITAMIN C) 500 MG CAPS Take 1 tablet by mouth daily.     Historical Provider, MD  bisacodyl (DULCOLAX) 10 MG suppository Place 1 suppository (10 mg total) rectally daily as needed for moderate constipation. 01/23/14   Ripudeep Krystal Eaton, MD  budesonide-formoterol (SYMBICORT) 80-4.5 MCG/ACT inhaler Inhale 2 puffs into the lungs 2 (two) times daily. 11/07/11   Kathee Delton, MD  Ca & Phos-Vit D-Mag (CALCIUM) [id number] TABS Take 1 tablet by mouth daily.     Historical Provider, MD  Cholecalciferol (VITAMIN D) 2000 UNITS tablet Take 2,000 Units by mouth daily.    Historical Provider, MD  dabigatran (PRADAXA) 150 MG CAPS capsule Take 1 capsule (150 mg total) by mouth 2 (two) times daily. 01/23/14   Ripudeep Krystal Eaton, MD  diltiazem (CARDIZEM CD) 180 MG 24 hr capsule Take 1 capsule (180 mg total) by mouth daily. 03/12/14   Brooke O Edmisten, PA-C  ezetimibe-simvastatin (VYTORIN) 10-40 MG per tablet Take 1 tablet by mouth at bedtime.     Historical Provider, MD  furosemide (LASIX) 40 MG tablet Take 1 tablet (40 mg total) by mouth daily. 03/12/14   Brooke O Edmisten, PA-C  glyBURIDE micronized (GLYNASE) 3 MG tablet Take 3 mg by mouth daily.     Historical Provider, MD  guaiFENesin-codeine 100-10 MG/5ML syrup Take 5 mLs by mouth every 6 (six) hours as needed for cough. 01/23/14   Ripudeep Krystal Eaton, MD  lansoprazole (PREVACID) 30 MG capsule Take 30 mg by mouth daily at 12 noon.    Historical Provider, MD  levalbuterol [facility] HFA) 45 MCG/ACT inhaler Inhale 2 puffs into the lungs every 6 (six) hours as needed for wheezing. 03/12/14   Donita Brooks, NP  levalbuterol (XOPENEX) 0.63 MG/3ML nebulizer solution Take 3 mLs (0.63 mg total) by nebulization every 6 (six) hours as needed for wheezing or shortness of breath. 03/12/14   Donita Brooks, NP  Magnesium-Zinc 133.33-5 MG TABS Take 133 mg by mouth daily.    Historical Provider, MD  metoprolol (LOPRESSOR) 50 MG tablet Take 1 tablet (50 mg total) by mouth 2 (two) times daily. 03/12/14   Brooke O Edmisten, PA-C  Multiple  Vitamins-Iron (MULTIVITAMIN/IRON PO) Take 1 tablet by mouth daily.    Historical Provider, MD  potassium chloride SA (K-DUR,KLOR-CON) 20 MEQ tablet Take 20 mEq by mouth daily.     Historical Provider, MD  tiotropium (SPIRIVA) 18 MCG inhalation capsule Place 18 mcg into inhaler and inhale daily.    Historical Provider, MD  vitamin B-12 (CYANOCOBALAMIN) 500 MCG tablet Take 500 mcg by mouth daily.    Historical Provider, MD   Allergies  Allergen Reactions  . Ace Inhibitors Cough  . Clindamycin Rash    FAMILY HISTORY:  Family History  Problem Relation Age of Onset  . Breast cancer Sister   . Heart disease    . Hodgkin's lymphoma    . Asthma Mother    SOCIAL HISTORY:  reports that she quit smoking about 5 years ago. Her smoking use included Cigarettes. She has a 135 pack-year  smoking history. She has never used smokeless tobacco. She reports that she drinks alcohol. She reports that she does not use illicit drugs.  REVIEW OF SYSTEMS:  Patient is intubated and sedated, could not do ROS  SUBJECTIVE:   VITAL SIGNS: Temp:  [97 F (36.1 C)] 97 F (36.1 C) (04/27 1917) Pulse Rate:  [74-87] 74 (04/27 1900) Resp:  [14-30] 20 (04/27 1900) BP: (122-152)/(48-102) 127/50 mmHg (04/27 1900) SpO2:  [94 %-100 %] 98 % (04/27 1900) FiO2 (%):  [40 %-60 %] 60 % (04/27 1755) Weight:  [293 lb 14 oz (133.3 kg)] 293 lb 14 oz (133.3 kg) (04/27 1739) HEMODYNAMICS:   VENTILATOR SETTINGS: Vent Mode:  [-] PRVC FiO2 (%):  [40 %-60 %] 60 % Set Rate:  [20 bmp] 20 bmp Vt Set:  [530 mL] 530 mL PEEP:  [5 cmH20] 5 cmH20 Plateau Pressure:  [12 cmH20] 12 cmH20 INTAKE / OUTPUT: Intake/Output   None     PHYSICAL EXAMINATION: General: intubated and sedated, morbid obesity Neuro: not response to command HEENT: Neck soft, difficult to assess JVD Cardiovascular: S1/S2, regular rhythm and rate, no murmur Lungs: Diffuse crackles bilaterally  Abdomen: Soft, distended, no guarding, Bowel sounds  active Musculoskeletal: 2+ bilateral pitting leg edema Skin: not rashes  LABS: PULMONARY  Recent Labs Lab 03/23/14 1720 03/23/14 2140  PHART 7.179* 7.249*  PCO2ART 93.1* 70.2*  PO2ART 83.0 63.0*  HCO3 34.7* 30.9*  TCO2 37 33  O2SAT 92.0 88.0    CBC  Recent Labs Lab 03/23/14 1715 03/23/14 2133  HGB 8.2* 7.7*  HCT 27.9* 26.4*  WBC 17.3* 15.3*  PLT 385 286    COAGULATION  Recent Labs Lab 03/23/14 2133  INR 1.70*    CARDIAC  No results found for this basename: TROPONINI,  in the last 168 hours  Recent Labs Lab 03/23/14 1715  PROBNP 2475.0*     CHEMISTRY  Recent Labs Lab 03/23/14 1715  NA 130*  K 5.4*  CL 90*  CO2 27  GLUCOSE 297*  BUN 21  CREATININE 1.15*  CALCIUM 9.0   Estimated Creatinine Clearance: 70.7 ml/min (by C-G formula based on Cr of 1.15).   LIVER  Recent Labs Lab 03/23/14 1715 03/23/14 2133  AST 26  --   ALT 24  --   ALKPHOS 93  --   BILITOT 0.9  --   PROT 7.2  --   ALBUMIN 3.3*  --   INR  --  1.70*     INFECTIOUS No results found for this basename: LATICACIDVEN, PROCALCITON,  in the last 168 hours   ENDOCRINE CBG (last 3)   Recent Labs  03/23/14 2125  GLUCAP 298*         IMAGING x48h  Dg Chest Portable 1 View  03/23/2014   CLINICAL DATA:  ET tube placement  EXAM: PORTABLE CHEST - 1 VIEW  COMPARISON:  DG CHEST 1V PORT dated 03/23/2014  FINDINGS: There is an endotracheal tube with the tip 2.5 cm above the carina. There are bilateral interstitial and alveolar airspace opacities. There are bilateral small pleural effusions. There is no pneumothorax. There is stable cardiomegaly. There is aortic atherosclerosis.  IMPRESSION: 1. Endotracheal tube with the tip 2.5 cm above the carina. 2. Bilateral interstitial and alveolar airspace opacities which may be secondary to multilobar pneumonia versus pulmonary edema.   Electronically Signed   By: Kathreen Devoid   On: 03/23/2014 18:26   Dg Chest Port 1 View  03/23/2014    CLINICAL DATA:  Short of breath  EXAM: PORTABLE CHEST - 1 VIEW  COMPARISON:  DG CHEST 2 VIEW dated 03/06/2014  FINDINGS: Cardiomegaly. Diffuse edema. Vascular congestion. Increased opacity at the lung bases most consistent with airspace edema. No pneumothorax. No definite pleural effusion.  IMPRESSION: CHF with interstitial edema and some basilar airspace edema.   Electronically Signed   By: Maryclare Bean M.D.   On: 03/23/2014 17:35   Dg Abd Portable 1v  03/23/2014   CLINICAL DATA:  Orogastric tube placement.  EXAM: PORTABLE ABDOMEN - 1 VIEW  COMPARISON:  None.  FINDINGS: The patient's enteric tube is seen ending about the body of the stomach.  The visualized bowel gas pattern is grossly unremarkable. No definite intra-abdominal air is identified, though evaluation for free air is limited on a single supine view. No acute osseous abnormalities are seen. A small left pleural effusion is noted, with left basilar airspace opacity.  No acute osseous abnormalities are identified.  IMPRESSION: Enteric tube noted ending about the body of the stomach.   Electronically Signed   By: Garald Balding M.D.   On: 03/23/2014 21:17       ASSESSMENT / PLAN:  PULMONARY A: - History of COPD and emphysema-->now exacerbated - History of diastolic congestive heart failure with EF 60% a 2-D echo on 01/15/49-->now with CHF exacerbation - PAH, 53 mmHg - Acute hypercapnic respiratory failure now P:   - intubated and on ventilator support - Albuterol nebulizer every 2 hour, when necessary - Duoneb nebulizer every 6 hours  -Solu-Medrol 80 mg daily (staff note: Solumedrol IV 60mg  Q8h) - Diuresis  CARDIOVASCULAR A:  - History of paroxysmal atrial fibrillation, S/p of cardioversion, now regular sinus rhythm with heart rate of 70 to 80/min - hx of CHF-->now obviously fluid overloaded, proBNP 2475  P:  - Continue home medications for A. fib, including amiodarone, metoprolol, and Cardizem - Aggressive diuresis with Lasix 80  mg twice a day - Follow up BMP/mag/phosphorus twice a day, replete electrolytes as needed - Continue home dabigatran (PRADAXA) 150 Mg bid (staff note: hold for 24h and then 03/24/14 MD to decide on SQ lovenox) - SCD for DVT ppx - Trop X 1  RENAL A:  - History of CKD-III, Creatinine stable at 1.15 admission - HyperK: K5.4--> expect improving with diuresis  P:   - Diuresis as above - f/u BMP  GASTROINTESTINAL A:   - hx of GERD  P:   - IV Pepcid  HEMATOLOGIC A:  - Leukocytosis with WBC 17.3. No clear source of infection.  - monocytic anemia with hemoglobin 8.2 which is stable from previous 8.5 on 03/10/49  P:  F/u CBC  INFECTIOUS A:   - leukocytosis, productive cough with [place] sputum, no fever or chills-->will cover HCAP until pan-culture done - received one dose of vancomycin and cefepime in ED--> will continue  P:   - Repeat chest x-ray in morning after aggressive diuresis - Blood culture X 2 - subitum culture - urine culture - check PCT and lactic acid - check resp virus (staff note) via tracheal aspirate - check tracheal aspirate culture (Staff Note)  ENDOCRINE A:   - hx of DM-II, recent A1c 6.7 on 01/27/14,  - No history of thyroid dysfunction, recent TSH 1.384 on 01/27/14 P:   - SSI, with CBG goal 140 to 180  NEUROLOGIC A:   - Acute hypoxic encephalopathy secondary to acute respiratory failure   P:  - Neuro check q4h - diprivan gtt (staff note)  Ivor Costa, MD PGY3, Internal Medicine Teaching Service Pager: [phone]  03/23/2014, 7:40 PM   STAFF NOTE: I, Dr Ann Lions have personally reviewed patient's available data, including medical history, events of note, physical examination and test results as part of my evaluation. I have discussed with resident/NP and other care providers such as pharmacist, RN and RRT.  In addition,  I personally evaluated patient and elicited key findings of acute respiratory failure. Likely diastolich chf compounded by HCAP v  Viral Pneumonia. See my comments on Med resident notes.  Rest per NP/medical resident whose note is outlined above and that I agree with  The patient is critically ill with multiple organ systems failure and requires high complexity decision making for assessment and support, frequent evaluation and titration of therapies, application of advanced monitoring technologies and extensive interpretation of multiple databases.   Critical Care Time devoted to patient care services described in this note is  35  Minutes.  Dr. Brand Males, M.D., [facility].C.P Pulmonary and Critical Care Medicine Staff Physician [place] [facility] Pager: [phone], If no answer or between  15:00h - 7:00h: call [phone]  03/23/2014 10:20 PM

## 2014-03-23 NOTE — ED Notes (Signed)
Spoke with RES regarding lasix. Verbal order obtained for foley due to patients respiratory distress.

## 2014-03-23 NOTE — Patient Instructions (Signed)
Pt to [facility] ED by EMS for acute respiratory distress/SOB.

## 2014-03-23 NOTE — ED Notes (Signed)
Admitting MD at bedside.

## 2014-03-23 NOTE — ED Notes (Signed)
family at bedside.

## 2014-03-23 NOTE — ED Notes (Signed)
MD at bedside. 

## 2014-03-23 NOTE — Progress Notes (Signed)
Patient was transport to 2M10 from the ER on the vent with no complications.

## 2014-03-23 NOTE — ED Provider Notes (Signed)
CSN: [id number]     Arrival date & time 03/23/14  1639 History   First MD Initiated Contact with Patient 03/23/14 1643     Chief Complaint  Patient presents with  . Shortness of Breath     (Consider location/radiation/quality/duration/timing/severity/associated sxs/prior Treatment) The history is provided by the patient and the EMS personnel.   history of present illness: 68 year old female who presents from cardiology clinic for shortness of breath respiratory distress.  Patient reports worsening dyspnea over the course of the afternoon today.  She denies any chest pain.  Symptoms have been constant and worsening.  She denies any worsening cough or fevers.  She has had progressively worsening edema.  She was given 125 mg Solu-Medrol and an albuterol in route via EMS with minimal improvement.  She was admitted one month ago with similar symptoms at that time requiring intubation.  Patient states that she is DO NOT RESUSCITATE but is okay with intubation if necessary.  Past Medical History  Diagnosis Date  . Chronic low back pain   . Emphysema   . Paroxysmal atrial fibrillation   . COPD (chronic obstructive pulmonary disease)   . Hyperlipidemia   . Diastolic congestive heart failure   . Obese   . Carotid atherosclerosis   . Lung nodules   . Hoarseness, chronic   . Pneumonia 1990's    "once"  . Type 2 diabetes mellitus   . GERD (gastroesophageal reflux disease)   . Arthritis     "joints" (01/14/2014)  . Depression     "maybe" (01/14/2014   Past Surgical History  Procedure Laterality Date  . Esophagogastroduodenoscopy  01/26/2012    Procedure: ESOPHAGOGASTRODUODENOSCOPY (EGD);  Surgeon: Beryle Beams, MD;  Location: [facility];  Service: Endoscopy;  Laterality: N/A;  . Colonoscopy  01/26/2012    Procedure: COLONOSCOPY;  Surgeon: Beryle Beams, MD;  Location: [facility];  Service: Endoscopy;  Laterality: N/A;  . Breast biopsy Left ~ 1980    "solid"  . Breast lumpectomy Left ~  1980    "removed a mass; it was benign" (01/14/2014)  . Total abdominal hysterectomy  1994  . Cardioversion N/A 03/11/2014    Procedure: CARDIOVERSION;  Surgeon: Sanda Klein, MD;  Location: [facility] OR;  Service: Cardiovascular;  Laterality: N/A;   Family History  Problem Relation Age of Onset  . Breast cancer Sister   . Heart disease    . Hodgkin's lymphoma    . Asthma Mother    History  Substance Use Topics  . Smoking status: Former Smoker -- 3.00 packs/day for 45 years    Types: Cigarettes    Quit date: 12/28/2008  . Smokeless tobacco: Never Used  . Alcohol Use: Yes     Comment: 01/14/2014 "used to drink socially in the past; last drink was probably 10 yr ago"   OB History   Grav Para Term Preterm Abortions TAB SAB Ect Mult Living                 Review of Systems  Constitutional: Negative for fever and chills.  HENT: Negative for congestion.   Eyes: Negative for pain.  Respiratory: Positive for shortness of breath.   Cardiovascular: Positive for leg swelling. Negative for chest pain.  Gastrointestinal: Negative for nausea, vomiting, abdominal pain, diarrhea and constipation.  Genitourinary: Negative for dysuria.  Musculoskeletal: Negative for back pain.  Skin: Negative for rash and wound.  Neurological: Negative for headaches.  All other systems reviewed and are negative.  Allergies  Ace inhibitors and Clindamycin  Home Medications   Prior to Admission medications   Medication Sig Start Date End Date Taking? Authorizing Provider  acetaminophen (TYLENOL) 325 MG tablet Take 325 mg by mouth daily as needed for mild pain.     Historical Provider, MD  Amino Acids-Protein Hydrolys (FEEDING SUPPLEMENT, PRO-STAT SUGAR FREE 64,) LIQD Take 30 mLs by mouth 2 (two) times daily.    Historical Provider, MD  amiodarone (PACERONE) 200 MG tablet Take 2 tabs (400 mg) twice daily for 7 days, then take 2 tabs (400 mg) once daily for 14 days then take 1 tab (200 mg) once daily  thereafter 03/12/14   Brooke O Edmisten, PA-C  Ascorbic Acid (VITAMIN C) 500 MG CAPS Take 1 tablet by mouth daily.    Historical Provider, MD  bisacodyl (DULCOLAX) 10 MG suppository Place 1 suppository (10 mg total) rectally daily as needed for moderate constipation. 01/23/14   Ripudeep Krystal Eaton, MD  budesonide-formoterol (SYMBICORT) 80-4.5 MCG/ACT inhaler Inhale 2 puffs into the lungs 2 (two) times daily. 11/07/11   Kathee Delton, MD  Ca & Phos-Vit D-Mag (CALCIUM) [phone] TABS Take 1 tablet by mouth daily.     Historical Provider, MD  Cholecalciferol (VITAMIN D) 2000 UNITS tablet Take 2,000 Units by mouth daily.    Historical Provider, MD  dabigatran (PRADAXA) 150 MG CAPS capsule Take 1 capsule (150 mg total) by mouth 2 (two) times daily. 01/23/14   Ripudeep Krystal Eaton, MD  diltiazem (CARDIZEM CD) 180 MG 24 hr capsule Take 1 capsule (180 mg total) by mouth daily. 03/12/14   Brooke O Edmisten, PA-C  ezetimibe-simvastatin (VYTORIN) 10-40 MG per tablet Take 1 tablet by mouth at bedtime.     Historical Provider, MD  furosemide (LASIX) 40 MG tablet Take 1 tablet (40 mg total) by mouth daily. 03/12/14   Brooke O Edmisten, PA-C  glyBURIDE micronized (GLYNASE) 3 MG tablet Take 3 mg by mouth daily.     Historical Provider, MD  guaiFENesin-codeine 100-10 MG/5ML syrup Take 5 mLs by mouth every 6 (six) hours as needed for cough. 01/23/14   Ripudeep Krystal Eaton, MD  lansoprazole (PREVACID) 30 MG capsule Take 30 mg by mouth daily at 12 noon.    Historical Provider, MD  levalbuterol [facility] HFA) 45 MCG/ACT inhaler Inhale 2 puffs into the lungs every 6 (six) hours as needed for wheezing. 03/12/14   Donita Brooks, NP  levalbuterol (XOPENEX) 0.63 MG/3ML nebulizer solution Take 3 mLs (0.63 mg total) by nebulization every 6 (six) hours as needed for wheezing or shortness of breath. 03/12/14   Donita Brooks, NP  Magnesium-Zinc 133.33-5 MG TABS Take 133 mg by mouth daily.    Historical Provider, MD  metoprolol (LOPRESSOR) 50 MG  tablet Take 1 tablet (50 mg total) by mouth 2 (two) times daily. 03/12/14   Brooke O Edmisten, PA-C  Multiple Vitamins-Iron (MULTIVITAMIN/IRON PO) Take 1 tablet by mouth daily.    Historical Provider, MD  potassium chloride SA (K-DUR,KLOR-CON) 20 MEQ tablet Take 20 mEq by mouth daily.     Historical Provider, MD  tiotropium (SPIRIVA) 18 MCG inhalation capsule Place 18 mcg into inhaler and inhale daily.    Historical Provider, MD  vitamin B-12 (CYANOCOBALAMIN) 500 MCG tablet Take 500 mcg by mouth daily.    Historical Provider, MD   BP 139/48  Pulse 70  Temp(Src) 97 F (36.1 C) (Rectal)  Resp 18  Ht 5\' 9"  (1.753 m)  Wt 308 lb 6.8  oz (139.9 kg)  BMI 45.53 kg/m2  SpO2 93% Physical Exam  Nursing note and vitals reviewed. Constitutional: She is oriented to person, place, and time. She appears well-developed and well-nourished. She appears distressed. Face mask in place.  HENT:  Head: Normocephalic and atraumatic.  Eyes: Conjunctivae are normal.  Neck: Neck supple.  Cardiovascular: Normal rate, regular rhythm, normal heart sounds and intact distal pulses.   Pulmonary/Chest: Tachypnea noted. She is in respiratory distress (moderate). She has decreased breath sounds. She has wheezes. She has rales.  Abdominal: Soft. She exhibits no distension. There is no tenderness.  Musculoskeletal: Normal range of motion. She exhibits edema (2+ pitting bilateral lower extremity).  Neurological: She is alert and oriented to person, place, and time.  Skin: Skin is warm and dry.    ED Course  INTUBATION Date/Time: 03/23/2014 11:16 PM Performed by: Cherre Robins Authorized by: Cherre Robins Consent: Verbal consent obtained. Risks and benefits: risks, benefits and alternatives were discussed Consent given by: patient Patient understanding: patient states understanding of the procedure being performed Time out: Immediately prior to procedure a "time out" was called to verify the correct patient, procedure,  equipment, support staff and site/side marked as required. Indications: respiratory failure and  hypercapnia Intubation method: video-assisted Patient status: paralyzed (RSI) Sedatives: etomidate Paralytic: rocuronium Tube size: 7.5 mm Tube type: cuffed Number of attempts: 1 Cords visualized: yes Post-procedure assessment: chest rise,  ETCO2 monitor and CO2 detector Breath sounds: equal Cuff inflated: yes ETT to lip: 23 cm Tube secured with: ETT holder Chest x-ray interpreted by me. Chest x-ray findings: endotracheal tube in appropriate position Patient tolerance: Patient tolerated the procedure well with no immediate complications.   (including critical care time) Labs Review Labs Reviewed  CBC - Abnormal; Notable for the following:    WBC 17.3 (*)    RBC 3.20 (*)    Hemoglobin 8.2 (*)    HCT 27.9 (*)    MCH 25.6 (*)    MCHC 29.4 (*)    RDW 17.8 (*)    All other components within normal limits  BASIC METABOLIC PANEL - Abnormal; Notable for the following:    Sodium 130 (*)    Potassium 5.4 (*)    Chloride 90 (*)    Glucose, Bld 297 (*)    Creatinine, Ser 1.15 (*)    GFR calc non Af Amer 48 (*)    GFR calc Af Amer 55 (*)    All other components within normal limits  PRO B NATRIURETIC PEPTIDE - Abnormal; Notable for the following:    Pro B Natriuretic peptide (BNP) 2475.0 (*)    All other components within normal limits  URINALYSIS, ROUTINE W REFLEX MICROSCOPIC - Abnormal; Notable for the following:    APPearance CLOUDY (*)    Protein, ur 30 (*)    Leukocytes, UA SMALL (*)    All other components within normal limits  HEPATIC FUNCTION PANEL - Abnormal; Notable for the following:    Albumin 3.3 (*)    Bilirubin, Direct 0.4 (*)    All other components within normal limits  PHOSPHORUS - Abnormal; Notable for the following:    Phosphorus 5.0 (*)    All other components within normal limits  CBC WITH DIFFERENTIAL - Abnormal; Notable for the following:    WBC 15.3 (*)     RBC 3.01 (*)    Hemoglobin 7.7 (*)    HCT 26.4 (*)    MCH 25.6 (*)    MCHC 29.2 (*)  RDW 18.0 (*)    Neutrophils Relative % 96 (*)    Lymphocytes Relative 2 (*)    Monocytes Relative 2 (*)    Neutro Abs 14.7 (*)    Lymphs Abs 0.3 (*)    All other components within normal limits  PROTIME-INR - Abnormal; Notable for the following:    Prothrombin Time 19.5 (*)    INR 1.70 (*)    All other components within normal limits  URINE MICROSCOPIC-ADD ON - Abnormal; Notable for the following:    Casts HYALINE CASTS (*)    All other components within normal limits  GLUCOSE, CAPILLARY - Abnormal; Notable for the following:    Glucose-Capillary 298 (*)    All other components within normal limits  I-STAT ARTERIAL BLOOD GAS, ED - Abnormal; Notable for the following:    pH, Arterial 7.179 (*)    pCO2 arterial 93.1 (*)    Bicarbonate 34.7 (*)    Acid-Base Excess 4.0 (*)    All other components within normal limits  POCT I-STAT 3, ART BLOOD GAS (G3+) - Abnormal; Notable for the following:    pH, Arterial 7.249 (*)    pCO2 arterial 70.2 (*)    pO2, Arterial 63.0 (*)    Bicarbonate 30.9 (*)    All other components within normal limits  CULTURE, BLOOD (ROUTINE X 2)  CULTURE, BLOOD (ROUTINE X 2)  CULTURE, RESPIRATORY (NON-EXPECTORATED)  RESPIRATORY VIRUS PANEL  GRAM STAIN  URINE CULTURE  MRSA PCR SCREENING  MAGNESIUM  LACTIC ACID, PLASMA  TROPONIN I  BASIC METABOLIC PANEL  MAGNESIUM  PHOSPHORUS  BLOOD GAS, ARTERIAL  PROCALCITONIN  TROPONIN I  I-STAT TROPOININ, ED    Imaging Review Dg Chest Portable 1 View  03/23/2014   CLINICAL DATA:  ET tube placement  EXAM: PORTABLE CHEST - 1 VIEW  COMPARISON:  DG CHEST 1V PORT dated 03/23/2014  FINDINGS: There is an endotracheal tube with the tip 2.5 cm above the carina. There are bilateral interstitial and alveolar airspace opacities. There are bilateral small pleural effusions. There is no pneumothorax. There is stable cardiomegaly. There is  aortic atherosclerosis.  IMPRESSION: 1. Endotracheal tube with the tip 2.5 cm above the carina. 2. Bilateral interstitial and alveolar airspace opacities which may be secondary to multilobar pneumonia versus pulmonary edema.   Electronically Signed   By: Kathreen Devoid   On: 03/23/2014 18:26   Dg Chest Port 1 View  03/23/2014   CLINICAL DATA:  Short of breath  EXAM: PORTABLE CHEST - 1 VIEW  COMPARISON:  DG CHEST 2 VIEW dated 03/06/2014  FINDINGS: Cardiomegaly. Diffuse edema. Vascular congestion. Increased opacity at the lung bases most consistent with airspace edema. No pneumothorax. No definite pleural effusion.  IMPRESSION: CHF with interstitial edema and some basilar airspace edema.   Electronically Signed   By: Maryclare Bean M.D.   On: 03/23/2014 17:35   Dg Abd Portable 1v  03/23/2014   CLINICAL DATA:  Orogastric tube placement.  EXAM: PORTABLE ABDOMEN - 1 VIEW  COMPARISON:  None.  FINDINGS: The patient's enteric tube is seen ending about the body of the stomach.  The visualized bowel gas pattern is grossly unremarkable. No definite intra-abdominal air is identified, though evaluation for free air is limited on a single supine view. No acute osseous abnormalities are seen. A small left pleural effusion is noted, with left basilar airspace opacity.  No acute osseous abnormalities are identified.  IMPRESSION: Enteric tube noted ending about the body of the stomach.  Electronically Signed   By: Garald Balding M.D.   On: 03/23/2014 21:17     EKG Interpretation   Date/Time:  Monday March 23 2014 16:52:18 EDT Ventricular Rate:  85 PR Interval:  229 QRS Duration: 114 QT Interval:  416 QTC Calculation: 495 R Axis:   38 Text Interpretation:  Sinus rhythm Prolonged PR interval Probable left  atrial enlargement Incomplete right bundle branch block Low voltage,  precordial leads Borderline prolonged QT interval Artifact Confirmed by  Wyvonnia Dusky  MD, STEPHEN [phone]) on 03/23/2014 4:59:12 PM      MDM   Final  diagnoses:  Acute on chronic diastolic congestive heart failure     68 year old female with history of COPD, CHF, hyperlipidemia, diabetes, hypertension who presented from her cardiologist's office with worsening shortness of breath and in respiratory distress.  Patient was tachypnea with vital signs otherwise stable.  Exam with diminished breath sounds bilaterally and rales at the bases.  Patient has previously been DO NOT RESUSCITATE however has been intubated in the past.  Patient states today that she would want to be intubated today if necessary.  Patient was problem on BiPAP but had worsening mental status and no longer able to protect her airway.  Initial blood gas 7.1/93/83.  Patient intubated as documented above for hypercapnic respiratory failure.  Chest x-ray with interstitial edema and basilar airspace edema.  She was given Lasix IV.  Patient also with an elevated Danford count of 17.  Will give vancomycin and cefepime to cover for HCAP.  Critical care consult and and will admit for further management.    Renaldo Reel, MD 03/24/14 [id number]

## 2014-03-23 NOTE — Progress Notes (Signed)
Patient ID: DEVONNA OBOYLE, female   DOB: Oct 05, 1946, 68 y.o.   MRN: [id number] PCP: Dr. Tollie Pizza  68 yo with history of COPD on home oxygen, paroxysmal atrial fibrillation, and chronic diastolic CHF presented today for followup of recent prolonged hospitalization at [facility].  Prior to today, I have not seen her since around 2011.  She was admitted for several weeks from 3/15 to 4/15 with atrial fibrillation/RVR, acute on chronic diastolic CHF, and COPD exacerbation.  She ended up being cardioverted in the hospital and remains in NSR today on amiodarone.  She was diuresed with IV Lasix.  After the prolonged hospitalization, she was sent to [facility].    She is here today with a friend.  She is profoundly short of breath and hypoxic.  She looks pale.  She is tachypneic.  Oxygen saturation around 80% on 4L [place].  Her friend saw her earlier today.  She "felt bad" and looked pale but did not seem as bad as she is currently.  Patient states that she is short of breath and not having chest pain, but is otherwise unable to give much history given tachypnea.  BP and HR ok.  She is in NSR today.  We put her on oxygen by facemask and immediately called for EMS to take her to the ER.    ECG: NSR, normal  Labs (4/15): K 4.4, creatinine 1.4  1. Chronic low back pain.  2. COPD: On home oxygen. Prior smoker.  3. Type 2 diabetes.  4. Paroxysmal atrial fibrillation:  DCCV in 2010 and again in 4/15.  She is on warfarin and is on amiodarone to maintain NSR.   5. Hyperlipidemia.  6. Diastolic congestive heart failure: Echo (2/15) with EF 60%, mild MR, PA systolic pressure 53 mmHg. 7. Hypertension.  8. Low back pain 9. Obesity 10. CKD  SOCIAL HISTORY: Prior smoker.  Patient is currently in [facility]  FAMILY HISTORY: Non-Hodgkin lymphoma and breast cancer. No early-onset  coronary artery disease.   REVIEW OF SYSTEMS: Negative except as noted in the history of present  Illness.  No current  facility-administered medications for this visit.   Current Outpatient Prescriptions  Medication Sig Dispense Refill  . acetaminophen (TYLENOL) 325 MG tablet Take 325 mg by mouth daily as needed for mild pain.       . Amino Acids-Protein Hydrolys (FEEDING SUPPLEMENT, PRO-STAT SUGAR FREE 64,) LIQD Take 30 mLs by mouth 2 (two) times daily.      Marland Kitchen amiodarone (PACERONE) 200 MG tablet Take 2 tabs (400 mg) twice daily for 7 days, then take 2 tabs (400 mg) once daily for 14 days then take 1 tab (200 mg) once daily thereafter  90 tablet  3  . Ascorbic Acid (VITAMIN C) 500 MG CAPS Take 1 tablet by mouth daily.      . bisacodyl (DULCOLAX) 10 MG suppository Place 1 suppository (10 mg total) rectally daily as needed for moderate constipation.  12 suppository  0  . budesonide-formoterol (SYMBICORT) 80-4.5 MCG/ACT inhaler Inhale 2 puffs into the lungs 2 (two) times daily.  1 Inhaler  5  . Ca & Phos-Vit D-Mag (CALCIUM) [phone] TABS Take 1 tablet by mouth daily.       . Cholecalciferol (VITAMIN D) 2000 UNITS tablet Take 2,000 Units by mouth daily.      . dabigatran (PRADAXA) 150 MG CAPS capsule Take 1 capsule (150 mg total) by mouth 2 (two) times daily.  60 capsule    .  diltiazem (CARDIZEM CD) 180 MG 24 hr capsule Take 1 capsule (180 mg total) by mouth daily.  30 capsule  3  . ezetimibe-simvastatin (VYTORIN) 10-40 MG per tablet Take 1 tablet by mouth at bedtime.       . furosemide (LASIX) 40 MG tablet Take 1 tablet (40 mg total) by mouth daily.  30 tablet  3  . glyBURIDE micronized (GLYNASE) 3 MG tablet Take 3 mg by mouth daily.       Marland Kitchen guaiFENesin-codeine 100-10 MG/5ML syrup Take 5 mLs by mouth every 6 (six) hours as needed for cough.  120 mL  0  . lansoprazole (PREVACID) 30 MG capsule Take 30 mg by mouth daily at 12 noon.      . levalbuterol (XOPENEX HFA) 45 MCG/ACT inhaler Inhale 2 puffs into the lungs every 6 (six) hours as needed for wheezing.  1 Inhaler  12  . levalbuterol (XOPENEX) 0.63 MG/3ML  nebulizer solution Take 3 mLs (0.63 mg total) by nebulization every 6 (six) hours as needed for wheezing or shortness of breath.  3 mL  12  . Magnesium-Zinc 133.33-5 MG TABS Take 133 mg by mouth daily.      . metoprolol (LOPRESSOR) 50 MG tablet Take 1 tablet (50 mg total) by mouth 2 (two) times daily.  60 tablet  3  . Multiple Vitamins-Iron (MULTIVITAMIN/IRON PO) Take 1 tablet by mouth daily.      . potassium chloride SA (K-DUR,KLOR-CON) 20 MEQ tablet Take 20 mEq by mouth daily.       Marland Kitchen tiotropium (SPIRIVA) 18 MCG inhalation capsule Place 18 mcg into inhaler and inhale daily.      . vitamin B-12 (CYANOCOBALAMIN) 500 MCG tablet Take 500 mcg by mouth daily.       Facility-Administered Medications Ordered in Other Visits  Medication Dose Route Frequency Provider Last Rate Last Dose  . albuterol (PROVENTIL) (2.5 MG/3ML) 0.083% nebulizer solution 5 mg  5 mg Nebulization Once Ezequiel Essex, MD       BP 122/64  Pulse 79 General: Tachypneic, pale, appears quite uncomfortable Neck: Thick, JVP 12-14 cm, no thyromegaly or thyroid nodule.  Lungs: Distant breath sounds bilaterally. CV: Nondisplaced PMI.  Heart regular S1/S2, no S3/S4, 2/6 early SEM RUSB. 1+ edema to knees bilaterally.  No carotid bruit.  Normal pedal pulses.  Abdomen: Soft, nontender, no hepatosplenomegaly, no distention.  Skin: Intact without lesions or rashes.  Neurologic: Alert and oriented x 3.  Psych: Normal affect. Extremities: No clubbing or cyanosis.   Assessment/Plan: 1. Atrial fibrillation: Paroxysmal.  She remains in NSR today on amiodarone and warfarin.  She will need LFTs and TSH followed.  2. Acute respiratory failure: Patient is hypoxic and tachypneic.  She is short of breath at rest.  Distant lung sounds.  COPD may play a role.  However, she is certainly volume overloaded with significant JVD.  She will be going to the ER for full evaluation.  - Needs CXR to assess for PNA.  - Would treat for COPD exacerbation with  steroids/nebs/antibiotics.  - She will need diuresis.  3. Acute on chronic diastolic CHF: EF 28% on last echo.  Patient is volume overloaded on exam.  She will need IV diuresis in the hospital.  Would get CXR and BNP in the ER.  4. CKD: Will need BMET to reassess creatinine.   Larey Dresser 03/23/2014

## 2014-03-23 NOTE — ED Notes (Addendum)
MD and resident  at bedside preparing to intubate pt due to decreased mentation and increased difficulty breathing

## 2014-03-23 NOTE — ED Notes (Signed)
RT at bedside. POC to place patient on BiPAP.

## 2014-03-23 NOTE — ED Notes (Signed)
Pts husband Charlotte Crumb requested his number be placed in the chart. [phone]

## 2014-03-23 NOTE — ED Notes (Signed)
Pt in via EMS- per EMS, pt in c/o shortness of breath, recent admission for CHF and COPD, went for follow up visit today and became more short of breath, EMS was called by nursing, O2 level was 67% on RA at office, very diminished breath sounds, solumedrol given and albutrol 5mg , breathing has improved but continues to work very hard, RT to bedside upon arrival, history of intubation, BP 381 systolic palpated, HR 80, RR 28, 20g IV in R hand

## 2014-03-23 NOTE — Progress Notes (Signed)
MD has decided to hold off on inserting the ALINE at this time. MD says we will reassess if need be and go from there. RT will continue to monitor.

## 2014-03-23 NOTE — ED Notes (Signed)
Biagio Borg, [phone] home and [phone] cell.  call with any updates

## 2014-03-24 ENCOUNTER — Encounter (HOSPITAL_COMMUNITY): Payer: Self-pay | Admitting: *Deleted

## 2014-03-24 ENCOUNTER — Inpatient Hospital Stay (HOSPITAL_COMMUNITY): Payer: Medicare Other

## 2014-03-24 DIAGNOSIS — J9600 Acute respiratory failure, unspecified whether with hypoxia or hypercapnia: Secondary | ICD-10-CM

## 2014-03-24 DIAGNOSIS — J441 Chronic obstructive pulmonary disease with (acute) exacerbation: Principal | ICD-10-CM

## 2014-03-24 LAB — POCT I-STAT 3, ART BLOOD GAS (G3+)
Acid-Base Excess: 10 mmol/L — ABNORMAL HIGH (ref 0.0–2.0)
BICARBONATE: 36 meq/L — AB (ref 20.0–24.0)
O2 Saturation: 85 %
PCO2 ART: 58.6 mmHg — AB (ref 35.0–45.0)
PH ART: 7.397 (ref 7.350–7.450)
TCO2: 38 mmol/L (ref 0–100)
pO2, Arterial: 52 mmHg — ABNORMAL LOW (ref 80.0–100.0)

## 2014-03-24 LAB — BASIC METABOLIC PANEL
BUN: 22 mg/dL (ref 6–23)
BUN: 24 mg/dL — ABNORMAL HIGH (ref 6–23)
CALCIUM: 8.8 mg/dL (ref 8.4–10.5)
CHLORIDE: 92 meq/L — AB (ref 96–112)
CO2: 34 mEq/L — ABNORMAL HIGH (ref 19–32)
CO2: 34 meq/L — AB (ref 19–32)
CREATININE: 1.06 mg/dL (ref 0.50–1.10)
Calcium: 8.6 mg/dL (ref 8.4–10.5)
Chloride: 95 mEq/L — ABNORMAL LOW (ref 96–112)
Creatinine, Ser: 1.06 mg/dL (ref 0.50–1.10)
GFR calc Af Amer: 61 mL/min — ABNORMAL LOW (ref >90)
GFR calc non Af Amer: 53 mL/min — ABNORMAL LOW (ref >90)
GFR, EST AFRICAN AMERICAN: 61 mL/min — AB (ref >90)
GFR, EST NON AFRICAN AMERICAN: 53 mL/min — AB (ref >90)
GLUCOSE: 212 mg/dL — AB (ref 70–99)
Glucose, Bld: 235 mg/dL — ABNORMAL HIGH (ref 70–99)
Potassium: 4.2 mEq/L (ref 3.7–5.3)
Potassium: 4.3 mEq/L (ref 3.7–5.3)
SODIUM: 140 meq/L (ref 137–147)
Sodium: 136 mEq/L — ABNORMAL LOW (ref 137–147)

## 2014-03-24 LAB — CBC
HCT: 23.7 % — ABNORMAL LOW (ref 36.0–46.0)
Hemoglobin: 7.1 g/dL — ABNORMAL LOW (ref 12.0–15.0)
MCH: 26.1 pg (ref 26.0–34.0)
MCHC: 30 g/dL (ref 30.0–36.0)
MCV: 87.1 fL (ref 78.0–100.0)
PLATELETS: 243 10*3/uL (ref 150–400)
RBC: 2.72 MIL/uL — ABNORMAL LOW (ref 3.87–5.11)
RDW: 18 % — ABNORMAL HIGH (ref 11.5–15.5)
WBC: 11.7 10*3/uL — ABNORMAL HIGH (ref 4.0–10.5)

## 2014-03-24 LAB — TRIGLYCERIDES: TRIGLYCERIDES: 50 mg/dL (ref <150)

## 2014-03-24 LAB — GLUCOSE, CAPILLARY
GLUCOSE-CAPILLARY: 274 mg/dL — AB (ref 70–99)
GLUCOSE-CAPILLARY: 247 mg/dL — AB (ref 70–99)
GLUCOSE-CAPILLARY: 226 mg/dL — AB (ref 70–99)
Glucose-Capillary: 138 mg/dL — ABNORMAL HIGH (ref 70–99)
Glucose-Capillary: 209 mg/dL — ABNORMAL HIGH (ref 70–99)
Glucose-Capillary: 314 mg/dL — ABNORMAL HIGH (ref 70–99)

## 2014-03-24 LAB — PROCALCITONIN: Procalcitonin: 0.4 ng/mL

## 2014-03-24 LAB — CBC WITH DIFFERENTIAL/PLATELET
BASOS ABS: 0 10*3/uL (ref 0.0–0.1)
Basophils Relative: 0 % (ref 0–1)
EOS PCT: 0 % (ref 0–5)
Eosinophils Absolute: 0 10*3/uL (ref 0.0–0.7)
HCT: 24 % — ABNORMAL LOW (ref 36.0–46.0)
Hemoglobin: 7 g/dL — ABNORMAL LOW (ref 12.0–15.0)
LYMPHS ABS: 0.3 10*3/uL — AB (ref 0.7–4.0)
LYMPHS PCT: 3 % — AB (ref 12–46)
MCH: 25.6 pg — ABNORMAL LOW (ref 26.0–34.0)
MCHC: 29.2 g/dL — ABNORMAL LOW (ref 30.0–36.0)
MCV: 87.9 fL (ref 78.0–100.0)
Monocytes Absolute: 0.2 10*3/uL (ref 0.1–1.0)
Monocytes Relative: 2 % — ABNORMAL LOW (ref 3–12)
Neutro Abs: 9.4 10*3/uL — ABNORMAL HIGH (ref 1.7–7.7)
Neutrophils Relative %: 95 % — ABNORMAL HIGH (ref 43–77)
PLATELETS: 234 10*3/uL (ref 150–400)
RBC: 2.73 MIL/uL — AB (ref 3.87–5.11)
RDW: 17.9 % — AB (ref 11.5–15.5)
WBC: 9.9 10*3/uL (ref 4.0–10.5)

## 2014-03-24 LAB — PREPARE RBC (CROSSMATCH)

## 2014-03-24 LAB — TROPONIN I
Troponin I: <0.3 ng/mL (ref <0.30)
Troponin I: <0.3 ng/mL (ref <0.30)

## 2014-03-24 LAB — MAGNESIUM
MAGNESIUM: 2.2 mg/dL (ref 1.5–2.5)
Magnesium: 2.1 mg/dL (ref 1.5–2.5)
Magnesium: 2.2 mg/dL (ref 1.5–2.5)

## 2014-03-24 LAB — ABO/RH: ABO/RH(D): A POS

## 2014-03-24 LAB — PHOSPHORUS
PHOSPHORUS: 3.9 mg/dL (ref 2.3–4.6)
Phosphorus: 3.7 mg/dL (ref 2.3–4.6)
Phosphorus: 3.8 mg/dL (ref 2.3–4.6)

## 2014-03-24 LAB — HEMOGLOBIN AND HEMATOCRIT, BLOOD
HCT: 23.5 % — ABNORMAL LOW (ref 36.0–46.0)
Hemoglobin: 7 g/dL — ABNORMAL LOW (ref 12.0–15.0)

## 2014-03-24 LAB — MRSA PCR SCREENING: MRSA BY PCR: NEGATIVE

## 2014-03-24 MED ORDER — CHLORHEXIDINE GLUCONATE 0.12 % MT SOLN
15.0000 mL | Freq: Two times a day (BID) | OROMUCOSAL | Status: DC
  Administered 2014-03-24 – 2014-04-03 (×17): 15 mL via OROMUCOSAL
  Filled 2014-03-24 (×21): qty 15

## 2014-03-24 MED ORDER — INSULIN ASPART 100 UNIT/ML ~~LOC~~ SOLN
0.0000 [IU] | SUBCUTANEOUS | Status: DC
  Administered 2014-03-24: 7 [IU] via SUBCUTANEOUS
  Administered 2014-03-25 (×2): 11 [IU] via SUBCUTANEOUS
  Administered 2014-03-25 (×3): 7 [IU] via SUBCUTANEOUS
  Administered 2014-03-25: 4 [IU] via SUBCUTANEOUS
  Administered 2014-03-26: 7 [IU] via SUBCUTANEOUS
  Administered 2014-03-26 (×2): 4 [IU] via SUBCUTANEOUS
  Administered 2014-03-26: 7 [IU] via SUBCUTANEOUS

## 2014-03-24 MED ORDER — VITAL HIGH PROTEIN PO LIQD
1000.0000 mL | ORAL | Status: DC
  Administered 2014-03-24: 17:00:00
  Administered 2014-03-24: 1000 mL
  Administered 2014-03-24 – 2014-03-25 (×2)
  Filled 2014-03-24 (×4): qty 1000

## 2014-03-24 MED ORDER — INSULIN ASPART 100 UNIT/ML ~~LOC~~ SOLN
0.0000 [IU] | Freq: Three times a day (TID) | SUBCUTANEOUS | Status: DC
  Administered 2014-03-24: 3 [IU] via SUBCUTANEOUS

## 2014-03-24 MED ORDER — BIOTENE DRY MOUTH MT LIQD
15.0000 mL | Freq: Two times a day (BID) | OROMUCOSAL | Status: DC
  Administered 2014-03-24 – 2014-04-03 (×19): 15 mL via OROMUCOSAL

## 2014-03-24 MED ORDER — INSULIN ASPART 100 UNIT/ML ~~LOC~~ SOLN
3.0000 [IU] | SUBCUTANEOUS | Status: DC
  Administered 2014-03-24 – 2014-03-26 (×9): 3 [IU] via SUBCUTANEOUS

## 2014-03-24 MED ORDER — PRO-STAT SUGAR FREE PO LIQD
30.0000 mL | Freq: Four times a day (QID) | ORAL | Status: DC
  Administered 2014-03-24 – 2014-03-25 (×5): 30 mL
  Filled 2014-03-24 (×11): qty 30

## 2014-03-24 MED ORDER — FENTANYL CITRATE 0.05 MG/ML IJ SOLN
25.0000 ug | INTRAMUSCULAR | Status: DC | PRN
  Administered 2014-03-24: 25 ug via INTRAVENOUS
  Filled 2014-03-24: qty 2

## 2014-03-24 MED ORDER — INSULIN GLARGINE 100 UNIT/ML ~~LOC~~ SOLN
10.0000 [IU] | Freq: Two times a day (BID) | SUBCUTANEOUS | Status: DC
  Administered 2014-03-24 – 2014-03-26 (×4): 10 [IU] via SUBCUTANEOUS
  Filled 2014-03-24 (×5): qty 0.1

## 2014-03-24 NOTE — Progress Notes (Signed)
Inpatient Diabetes Program Recommendations  AACE/ADA: New Consensus Statement on Inpatient Glycemic Control (2013)  Target Ranges:  Prepandial:   less than 140 mg/dL      Peak postprandial:   less than 180 mg/dL (1-2 hours)      Critically ill patients:  140 - 180 mg/dL   Reason for assessmentt: Hyperglycemia  Diabetes history: Type 2 Outpatient Diabetes medications: Glynase 3 mg  Current orders for Inpatient glycemic control: Moderate correction Novolog every 4 hours.  Results for Paige Hardin, Paige Hardin (MRN [id number]) as of 03/24/2014 10:50  Ref. Range 03/12/2014 11:17 03/23/2014 21:25 03/23/2014 23:51 03/24/2014 03:48 03/24/2014 08:05  Glucose-Capillary Latest Range: 70-99 mg/dL 223 (H) 298 (H) 314 (H) 274 (H) 247 (H)   Note: Receiving 8 units correction with most CBG checks.  CBG's in mid 200's last check.  On Solu Medrol.  Request MD consider adding Lantus 35 to 40 units daily with titration based on CBG's and steroid dose.  May also consider increasing correction from moderate to resistant scale while on steroids.  Thank you.  Paige Hardin S. Marcelline Mates, RN, CNS, CDE Inpatient Diabetes Program, team pager [phone]

## 2014-03-24 NOTE — Progress Notes (Signed)
PULMONARY / CRITICAL CARE MEDICINE   Name: Paige Hardin MRN: [id number] DOB: 23-Feb-1946    ADMISSION DATE:  03/23/2014 CONSULTATION DATE:  03/23/2014  REFERRING MD : ED  PRIMARY SERVICE: PCCM  CHIEF COMPLAINT: SOB  BRIEF PATIENT DESCRIPTION:  The patient is a 68 year old woman with morbid obesity, severe COPD, lung nodules, DM-II, chronic diastolic HF (EF 60 % on 9/52/84), paroxysmal atrial fibrillation and s/p of cardioversion, who presents with worsening SOB and respiratory failure. Likely due to combinational for CHF and COPD exacerbation. Intubated in ED.   SIGNIFICANT EVENTS / STUDIES:  ETT 4/27>>>  LINES / TUBES:  PIV 4/27>>>  CULTURES:  Blood culture 4/27>>> Sputum culture 4/27>>>  Tracheal aspirate 4/27>>  Resp virus tracheal aspirate >>> 4/27  Urine culture 4/27 >>> Urine legionella 4/27 >>> Urine strep 4/27 >>>  ANTIBIOTICS:  Vancomycin 4/27>>>  Cefpime 4/27>>>  INTERVAL HISTORY:  Vitals stable.  3.2L output overnight s/p 180mg  of IV Lasix.    VITAL SIGNS: Temp:  [97 F (36.1 C)-98.5 F (36.9 C)] 98.4 F (36.9 C) (04/28 0841) Pulse Rate:  [69-87] 73 (04/28 0857) Resp:  [13-30] 22 (04/28 0857) BP: (119-154)/(36-102) 134/56 mmHg (04/28 0857) SpO2:  [90 %-100 %] 94 % (04/28 0857) FiO2 (%):  [40 %-60 %] 40 % (04/28 0858) Weight:  [133.3 kg (293 lb 14 oz)-139.9 kg (308 lb 6.8 oz)] 137.5 kg (303 lb 2.1 oz) (04/28 0600)  HEMODYNAMICS:   VENTILATOR SETTINGS: Vent Mode:  [-] PSV FiO2 (%):  [40 %-60 %] 40 % Set Rate:  [12 bmp-20 bmp] 12 bmp Vt Set:  [530 mL] 530 mL PEEP:  [5 cmH20] 5 cmH20 Pressure Support:  [5 cmH20] 5 cmH20 Plateau Pressure:  [12 cmH20-20 cmH20] 18 cmH20  INTAKE / OUTPUT: Intake/Output     04/27 0701 - 04/28 0700 04/28 0701 - 04/29 0700   I.V. (mL/kg) 263.8 (1.9) 31 (0.2)   IV Piggyback 600    Total Intake(mL/kg) 863.8 (6.3) 31 (0.2)   Urine (mL/kg/hr) 3200 125 (0.4)   Total Output 3200 125   Net -2336.2 -94         PHYSICAL  EXAMINATION: General:  Intubated on ventilator Neuro:  Awake and alert, RASS -1 HEENT:  ETT in place Cardiovascular:  RRR, +S1+S2 Lungs:  B/L rhonchi Abdomen:  +BS, soft, NT, ND Musculoskeletal:  Able to move extremities voluntarily Extremities:  Trace-1+ B/L lower extremity edema Skin:  Intact   LABS:  CBC  Recent Labs Lab 03/23/14 1715 03/23/14 2133 03/24/14 0747  WBC 17.3* 15.3* 9.9  HGB 8.2* 7.7* 7.0*  HCT 27.9* 26.4* 24.0*  PLT 385 286 234   Coag's  Recent Labs Lab 03/23/14 2133  INR 1.70*   BMET  Recent Labs Lab 03/23/14 1715 03/24/14 0747  NA 130* 136*  K 5.4* 4.2  CL 90* 92*  CO2 27 34*  BUN 21 22  CREATININE 1.15* 1.06  GLUCOSE 297* 235*   Electrolytes  Recent Labs Lab 03/23/14 1715 03/23/14 2133 03/24/14 0747  CALCIUM 9.0  --  8.6  MG  --  2.4 2.2  PHOS  --  5.0* 3.7   Sepsis Markers  Recent Labs Lab 03/23/14 2133  LATICACIDVEN 0.9  PROCALCITON 0.40   ABG  Recent Labs Lab 03/23/14 1720 03/23/14 2140 03/24/14 0735  PHART 7.179* 7.249* 7.397  PCO2ART 93.1* 70.2* 58.6*  PO2ART 83.0 63.0* 52.0*   Liver Enzymes  Recent Labs Lab 03/23/14 1715  AST 26  ALT 24  ALKPHOS 93  BILITOT 0.9  ALBUMIN 3.3*   Cardiac Enzymes  Recent Labs Lab 03/23/14 1715 03/23/14 2133 03/24/14 0747  TROPONINI  --  <0.30 <0.30  PROBNP 2475.0*  --   --    Glucose  Recent Labs Lab 03/23/14 2125 03/23/14 2351 03/24/14 0348 03/24/14 0805  GLUCAP 298* 314* 274* 247*    Imaging Dg Chest Port 1 View  03/24/2014   CLINICAL DATA:  Acute respiratory failure.  EXAM: PORTABLE CHEST - 1 VIEW  COMPARISON:  03/23/2014 and 03/06/2014  FINDINGS: Endotracheal tube is in good position 3.6 cm above the carina. NG tube tip is below the diaphragm.  There is chronic cardiomegaly. Pulmonary vascularity is less prominent and the diffuse interstitial and alveolar accentuation has diminished suggesting that this represents pulmonary edema. There small  bilateral pleural effusions.  IMPRESSION: Improved interstitial and alveolar infiltrates. Small effusions. Findings may represent pulmonary edema.   Electronically Signed   By: Rozetta Nunnery M.D.   On: 03/24/2014 08:06   Dg Chest Portable 1 View  03/23/2014   CLINICAL DATA:  ET tube placement  EXAM: PORTABLE CHEST - 1 VIEW  COMPARISON:  DG CHEST 1V PORT dated 03/23/2014  FINDINGS: There is an endotracheal tube with the tip 2.5 cm above the carina. There are bilateral interstitial and alveolar airspace opacities. There are bilateral small pleural effusions. There is no pneumothorax. There is stable cardiomegaly. There is aortic atherosclerosis.  IMPRESSION: 1. Endotracheal tube with the tip 2.5 cm above the carina. 2. Bilateral interstitial and alveolar airspace opacities which may be secondary to multilobar pneumonia versus pulmonary edema.   Electronically Signed   By: Kathreen Devoid   On: 03/23/2014 18:26   Dg Chest Port 1 View  03/23/2014   CLINICAL DATA:  Short of breath  EXAM: PORTABLE CHEST - 1 VIEW  COMPARISON:  DG CHEST 2 VIEW dated 03/06/2014  FINDINGS: Cardiomegaly. Diffuse edema. Vascular congestion. Increased opacity at the lung bases most consistent with airspace edema. No pneumothorax. No definite pleural effusion.  IMPRESSION: CHF with interstitial edema and some basilar airspace edema.   Electronically Signed   By: Maryclare Bean M.D.   On: 03/23/2014 17:35   Dg Abd Portable 1v  03/23/2014   CLINICAL DATA:  Orogastric tube placement.  EXAM: PORTABLE ABDOMEN - 1 VIEW  COMPARISON:  None.  FINDINGS: The patient's enteric tube is seen ending about the body of the stomach.  The visualized bowel gas pattern is grossly unremarkable. No definite intra-abdominal air is identified, though evaluation for free air is limited on a single supine view. No acute osseous abnormalities are seen. A small left pleural effusion is noted, with left basilar airspace opacity.  No acute osseous abnormalities are identified.   IMPRESSION: Enteric tube noted ending about the body of the stomach.   Electronically Signed   By: Garald Balding M.D.   On: 03/23/2014 21:17   CXR: 04/28 pulmonary edema   ASSESSMENT / PLAN:  PULMONARY  A:  - Acute hypercapnic respiratory failure 2/2 to acute exacerbation of dHF (EF 60% TTE 02/15) with AECOPD likely contributing; ABG improved this AM - PAH, 53 mmHg  P:  - Mechanical ventilation - VAP bundle - SBT 4/28 and daily - Lasix 80mg  BID - Albuterol neb q2h prn - Duoneb q6h - IV Solu-medrol 60mg  q8h    CARDIOVASCULAR  A:  - Hx of PAF (s/p of cardioversion) now NSR with heart rate of 70-80s - acute on chronic dHF--> clinically volume  overloaded at admission, proBNP 2475  P:  - Continue home medications for Afib, including amiodarone, metoprolol, and Cardizem  - Aggressive diuresis with Lasix 80mg  BID - Follow up BMP/Mg/Phos BID, replete electrolytes as needed  - Hold home Pradaxa, start Lovenox  - VTE ppx - SCDs and Lovenox   RENAL  A:  - CKD3, Cr stable at 1.15 admission  - Hyperkalemia K5.4 --> improvement with diuresis  P:  - Diuresis as above, adjust based on renal status - Monitor BMP    GASTROINTESTINAL  A:  - GERD  P:  - IV Pepcid - NPO    HEMATOLOGIC  A:  - Leukocytosis, resolved WBC 17.3 --> 9.9, no clear source of infection.  - Chronic anemia, Hgb 8.2, near baseline 7-9 P:  - Monitor for signs and symptoms of infection - Monitor CBC     INFECTIOUS  A:  - Leukocytosis, productive cough with Ebarb sputum, no fever or chills; lactic acid 0.9 reassuring, PCT 0.4 P:  - Abx as above - Pan-culture pending - Trend PCT and lactic acid    ENDOCRINE  A:  - DM type 2, A1c 6.7 on 01/27/14 P:  - SSI, with CBG goal 140 to 180  - Monitor CBG  NEUROLOGIC  A:  - Acute hypoxic encephalopathy 2/2 to acute respiratory failure  P:  - Neuro check q4h  - Propofol gtt    Duwaine Maxin, DO IMTS, PGY1  CC time 30 minutes  Baltazar Apo, MD,  PhD 03/24/2014, 9:40 AM [facility] [phone] or if no answer [phone]

## 2014-03-24 NOTE — Care Management Note (Addendum)
    Page 1 of 1   03/27/2014     11:42:20 AM CARE MANAGEMENT NOTE 03/27/2014  Patient:  Paige Hardin, Paige Hardin   Account Number:  [id number]  Date Initiated:  03/24/2014  Documentation initiated by:  Elissa Hefty  Subjective/Objective Assessment:   adm w resp failure-vent     Action/Plan:   from nsg facility   Anticipated DC Date:  03/29/2014   Anticipated DC Plan:  [place]  In-house referral  Clinical Social Worker      DC Planning Services  CM consult      Choice offered to / List presented to:             Status of service:  Completed, signed off Medicare Important Message given?  YES (If response is "NO", the following Medicare IM given date fields will be blank) Date Medicare IM given:  03/27/2014 Date Additional Medicare IM given:  03/27/2014  Discharge Disposition:    Per UR Regulation:  Reviewed for med. necessity/level of care/duration of stay  If discussed at [place] of Stay Meetings, dates discussed:    Comments:

## 2014-03-24 NOTE — Progress Notes (Signed)
INITIAL NUTRITION ASSESSMENT  DOCUMENTATION CODES Per approved criteria  -Morbid Obesity   INTERVENTION:  Utilize 66M PEPuP Protocol: initiate TF via OGT with Vital High Protein at 25 ml/h and Prostat 30 ml QID on day 1; on day 2, increase to goal rate of 40 ml/h (960 ml per day) to provide 1360 kcals, 144 gm protein, 803 ml free water daily.  Above TF regimen plus current propofol will provide a total of 1571 kcals (24 kcals/kg ideal weight) and 144 gm protein (99% of minimum estimated needs).  NUTRITION DIAGNOSIS: Inadequate oral intake related to inability to eat as evidenced by NPO status.   Goal: Enteral nutrition to provide 60-70% of estimated calorie needs (22-25 kcals/kg ideal body weight) and 100% of estimated protein needs, based on ASPEN guidelines for permissive underfeeding in critically ill obese individuals  Monitor:  TF tolerance/adequacy, weight trend, labs, vent status.  Reason for Assessment: MD Consult for TF initiation and management.  68 y.o. female  Admitting Dx: SOB  ASSESSMENT: The patient is a 68 year old woman with morbid obesity, severe COPD, lung nodules, DM-II, chronic diastolic HF (EF 60 % on 9/56/38), paroxysmal atrial fibrillation and s/p of cardioversion, who presents with worsening SOB and respiratory failure. Likely due to combinational for CHF and COPD exacerbation. Intubated in ED.   Nutrition focused physical exam completed.  No muscle or subcutaneous fat depletion noticed.  Patient is currently intubated on ventilator support MV: 9.1 L/min Temp (24hrs), Avg:98 F (36.7 C), Min:97 F (36.1 C), Max:98.7 F (37.1 C)  Propofol: 8 ml/hr providing 211 kcals/day.  Height: Ht Readings from Last 1 Encounters:  03/23/14 5\' 9"  (1.753 m)    Weight: Wt Readings from Last 1 Encounters:  03/24/14 303 lb 2.1 oz (137.5 kg)    Ideal Body Weight: 65.9 kg  % Ideal Body Weight: 209%  Wt Readings from Last 10 Encounters:  03/24/14 303 lb 2.1 oz  (137.5 kg)  03/12/14 293 lb 14.4 oz (133.312 kg)  03/12/14 293 lb 14.4 oz (133.312 kg)  02/12/14 299 lb (135.626 kg)  01/29/14 297 lb 3.2 oz (134.809 kg)  01/23/14 297 lb (134.718 kg)  10/07/13 318 lb 6.4 oz (144.425 kg)  11/05/12 289 lb (131.09 kg)  05/07/12 294 lb (133.358 kg)  01/26/12 312 lb (141.522 kg)    Usual Body Weight: 297 lb  % Usual Body Weight: 102%  BMI:  Body mass index is 44.74 kg/(m^2). class 3, extreme/morbid obesity  Estimated Nutritional Needs: Kcal: 2052 Protein: 145-165 gm Fluid: 2-2.2 L  Skin: no wounds  Diet Order: NPO  EDUCATION NEEDS: -Education not appropriate at this time   Intake/Output Summary (Last 24 hours) at 03/24/14 1359 Last data filed at 03/24/14 1300  Gross per 24 hour  Intake 959.43 ml  Output   3925 ml  Net -2965.57 ml    Last BM: None documented since admission  Labs:   Recent Labs Lab 03/23/14 1715 03/23/14 2133 03/24/14 0747  NA 130*  --  136*  K 5.4*  --  4.2  CL 90*  --  92*  CO2 27  --  34*  BUN 21  --  22  CREATININE 1.15*  --  1.06  CALCIUM 9.0  --  8.6  MG  --  2.4 2.2  PHOS  --  5.0* 3.7  GLUCOSE 297*  --  235*    CBG (last 3)   Recent Labs  03/24/14 0348 03/24/14 0805 03/24/14 1135  GLUCAP 274* 247*  209*    Scheduled Meds: . amiodarone  400 mg Per Tube Daily  . antiseptic oral rinse  15 mL Mouth Rinse q12n4p  . ezetimibe  10 mg Oral QHS   And  . atorvastatin  20 mg Oral QHS  . calcium carbonate (dosed in mg elemental calcium)  500 mg of elemental calcium Oral Q breakfast  . ceFEPime (MAXIPIME) IV  1 g Intravenous 3 times per day  . chlorhexidine  15 mL Mouth Rinse BID  . cholecalciferol  2,000 Units Oral Daily  . diltiazem  60 mg Per Tube 3 times per day  . enoxaparin (LOVENOX) injection  1 mg/kg Subcutaneous Q12H  . famotidine (PEPCID) IV  20 mg Intravenous Q12H  . furosemide  80 mg Intravenous BID  . insulin aspart  0-15 Units Subcutaneous 6 times per day  .  ipratropium-albuterol  3 mL Nebulization Q6H  . methylPREDNISolone (SOLU-MEDROL) injection  60 mg Intravenous 3 times per day  . metoprolol  50 mg Oral BID  . sodium chloride  3 mL Intravenous Q12H  . vancomycin  1,250 mg Intravenous Q12H  . vitamin B-12  500 mcg Oral Daily    Continuous Infusions: . propofol 9.531 mcg/kg/min (03/24/14 1300)    Past Medical History  Diagnosis Date  . Chronic low back pain   . Emphysema   . Paroxysmal atrial fibrillation   . COPD (chronic obstructive pulmonary disease)   . Hyperlipidemia   . Diastolic congestive heart failure   . Obese   . Carotid atherosclerosis   . Lung nodules   . Hoarseness, chronic   . Pneumonia 1990's    "once"  . Type 2 diabetes mellitus   . GERD (gastroesophageal reflux disease)   . Arthritis     "joints" (01/14/2014)  . Depression     "maybe" (01/14/2014    Past Surgical History  Procedure Laterality Date  . Esophagogastroduodenoscopy  01/26/2012    Procedure: ESOPHAGOGASTRODUODENOSCOPY (EGD);  Surgeon: Beryle Beams, MD;  Location: [facility];  Service: Endoscopy;  Laterality: N/A;  . Colonoscopy  01/26/2012    Procedure: COLONOSCOPY;  Surgeon: Beryle Beams, MD;  Location: [facility];  Service: Endoscopy;  Laterality: N/A;  . Breast biopsy Left ~ 1980    "solid"  . Breast lumpectomy Left ~ 1980    "removed a mass; it was benign" (01/14/2014)  . Total abdominal hysterectomy  1994  . Cardioversion N/A 03/11/2014    Procedure: CARDIOVERSION;  Surgeon: Sanda Klein, MD;  Location: [facility];  Service: Cardiovascular;  Laterality: N/A;    Molli Barrows, [place], LDN, [place] Pager [phone] After Hours Pager [phone]

## 2014-03-24 NOTE — ED Provider Notes (Signed)
I saw and evaluated the patient, reviewed the resident's note and I agree with the findings and plan. If applicable, I agree with the resident's interpretation of the EKG.  If applicable, I was present for critical portions of any procedures performed.  Respiratory distress since noon. Tachypnea, speaking in 1-2 word phrases, significant volume overload with rales and lower extremity edema.  Wheezing scattered. Worsening mental status on bipap with respiratory acidosis.  Family and patient agreeable to short term intubation.  Nebs, steroids, antibiotics, lasix.  Suspect respiratory failure combination of COPD and CHF.  CRITICAL CARE Performed by: Ezequiel Essex Total critical care time: 35 Critical care time was exclusive of separately billable procedures and treating other patients. Critical care was necessary to treat or prevent imminent or life-threatening deterioration. Critical care was time spent personally by me on the following activities: development of treatment plan with patient and/or surrogate as well as nursing, discussions with consultants, evaluation of patient's response to treatment, examination of patient, obtaining history from patient or surrogate, ordering and performing treatments and interventions, ordering and review of laboratory studies, ordering and review of radiographic studies, pulse oximetry and re-evaluation of patient's condition.   Ezequiel Essex, MD 03/24/14 [phone]

## 2014-03-25 ENCOUNTER — Inpatient Hospital Stay (HOSPITAL_COMMUNITY): Payer: Medicare Other

## 2014-03-25 ENCOUNTER — Encounter (HOSPITAL_COMMUNITY): Payer: Self-pay | Admitting: Cardiology

## 2014-03-25 DIAGNOSIS — I4891 Unspecified atrial fibrillation: Secondary | ICD-10-CM | POA: Diagnosis not present

## 2014-03-25 DIAGNOSIS — I10 Essential (primary) hypertension: Secondary | ICD-10-CM

## 2014-03-25 DIAGNOSIS — I2789 Other specified pulmonary heart diseases: Secondary | ICD-10-CM

## 2014-03-25 LAB — CBC WITH DIFFERENTIAL/PLATELET
BASOS PCT: 0 % (ref 0–1)
Basophils Absolute: 0 10*3/uL (ref 0.0–0.1)
Eosinophils Absolute: 0 10*3/uL (ref 0.0–0.7)
Eosinophils Relative: 0 % (ref 0–5)
HEMATOCRIT: 24.2 % — AB (ref 36.0–46.0)
HEMOGLOBIN: 7 g/dL — AB (ref 12.0–15.0)
LYMPHS ABS: 0.4 10*3/uL — AB (ref 0.7–4.0)
Lymphocytes Relative: 3 % — ABNORMAL LOW (ref 12–46)
MCH: 25.6 pg — ABNORMAL LOW (ref 26.0–34.0)
MCHC: 28.9 g/dL — ABNORMAL LOW (ref 30.0–36.0)
MCV: 88.6 fL (ref 78.0–100.0)
Monocytes Absolute: 0.6 10*3/uL (ref 0.1–1.0)
Monocytes Relative: 5 % (ref 3–12)
Neutro Abs: 10.8 10*3/uL — ABNORMAL HIGH (ref 1.7–7.7)
Neutrophils Relative %: 92 % — ABNORMAL HIGH (ref 43–77)
Platelets: 242 10*3/uL (ref 150–400)
RBC: 2.73 MIL/uL — ABNORMAL LOW (ref 3.87–5.11)
RDW: 18.2 % — ABNORMAL HIGH (ref 11.5–15.5)
WBC: 11.8 10*3/uL — AB (ref 4.0–10.5)

## 2014-03-25 LAB — BASIC METABOLIC PANEL
BUN: 29 mg/dL — ABNORMAL HIGH (ref 6–23)
BUN: 30 mg/dL — ABNORMAL HIGH (ref 6–23)
CALCIUM: 8.7 mg/dL (ref 8.4–10.5)
CHLORIDE: 92 meq/L — AB (ref 96–112)
CO2: 35 mEq/L — ABNORMAL HIGH (ref 19–32)
CO2: 38 meq/L — AB (ref 19–32)
CREATININE: 1.01 mg/dL (ref 0.50–1.10)
Calcium: 8.6 mg/dL (ref 8.4–10.5)
Chloride: 93 mEq/L — ABNORMAL LOW (ref 96–112)
Creatinine, Ser: 1.04 mg/dL (ref 0.50–1.10)
GFR calc Af Amer: 63 mL/min — ABNORMAL LOW (ref >90)
GFR calc Af Amer: 65 mL/min — ABNORMAL LOW (ref >90)
GFR calc non Af Amer: 56 mL/min — ABNORMAL LOW (ref >90)
GFR, EST NON AFRICAN AMERICAN: 54 mL/min — AB (ref >90)
GLUCOSE: 248 mg/dL — AB (ref 70–99)
Glucose, Bld: 242 mg/dL — ABNORMAL HIGH (ref 70–99)
Potassium: 3.8 mEq/L (ref 3.7–5.3)
Potassium: 3.3 mEq/L — ABNORMAL LOW (ref 3.7–5.3)
SODIUM: 139 meq/L (ref 137–147)
SODIUM: 143 meq/L (ref 137–147)

## 2014-03-25 LAB — RESPIRATORY VIRUS PANEL
Adenovirus: NOT DETECTED
Adenovirus: NOT DETECTED
INFLUENZA A H3: NOT DETECTED
Influenza A H1: NOT DETECTED
Influenza A H1: NOT DETECTED
Influenza A H3: NOT DETECTED
Influenza A: NOT DETECTED
Influenza A: NOT DETECTED
Influenza B: NOT DETECTED
Influenza B: NOT DETECTED
METAPNEUMOVIRUS: NOT DETECTED
Metapneumovirus: NOT DETECTED
PARAINFLUENZA 1 A: NOT DETECTED
PARAINFLUENZA 2 A: NOT DETECTED
Parainfluenza 1: NOT DETECTED
Parainfluenza 2: NOT DETECTED
Parainfluenza 3: NOT DETECTED
Parainfluenza 3: NOT DETECTED
RESPIRATORY SYNCYTIAL VIRUS B: NOT DETECTED
RHINOVIRUS: NOT DETECTED
RHINOVIRUS: NOT DETECTED
Respiratory Syncytial Virus A: NOT DETECTED
Respiratory Syncytial Virus A: NOT DETECTED
Respiratory Syncytial Virus B: NOT DETECTED

## 2014-03-25 LAB — MAGNESIUM
MAGNESIUM: 2.1 mg/dL (ref 1.5–2.5)
Magnesium: 2.1 mg/dL (ref 1.5–2.5)
Magnesium: 2 mg/dL (ref 1.5–2.5)

## 2014-03-25 LAB — POCT I-STAT 3, ART BLOOD GAS (G3+)
ACID-BASE EXCESS: 13 mmol/L — AB (ref 0.0–2.0)
Acid-Base Excess: 14 mmol/L — ABNORMAL HIGH (ref 0.0–2.0)
Acid-Base Excess: 15 mmol/L — ABNORMAL HIGH (ref 0.0–2.0)
BICARBONATE: 38.2 meq/L — AB (ref 20.0–24.0)
BICARBONATE: 38.9 meq/L — AB (ref 20.0–24.0)
Bicarbonate: 38.9 mEq/L — ABNORMAL HIGH (ref 20.0–24.0)
O2 SAT: 95 %
O2 Saturation: 94 %
O2 Saturation: 94 %
PH ART: 7.5 — AB (ref 7.350–7.450)
PH ART: 7.518 — AB (ref 7.350–7.450)
Patient temperature: 99.2
Patient temperature: 99.3
TCO2: 40 mmol/L (ref 0–100)
TCO2: 40 mmol/L (ref 0–100)
TCO2: 41 mmol/L (ref 0–100)
pCO2 arterial: 49.2 mmHg — ABNORMAL HIGH (ref 35.0–45.0)
pCO2 arterial: 48.1 mmHg — ABNORMAL HIGH (ref 35.0–45.0)
pCO2 arterial: 57 mmHg — ABNORMAL HIGH (ref 35.0–45.0)
pH, Arterial: 7.443 (ref 7.350–7.450)
pO2, Arterial: 69 mmHg — ABNORMAL LOW (ref 80.0–100.0)
pO2, Arterial: 68 mmHg — ABNORMAL LOW (ref 80.0–100.0)
pO2, Arterial: 79 mmHg — ABNORMAL LOW (ref 80.0–100.0)

## 2014-03-25 LAB — PHOSPHORUS
PHOSPHORUS: 3.1 mg/dL (ref 2.3–4.6)
PHOSPHORUS: 3.5 mg/dL (ref 2.3–4.6)
PHOSPHORUS: 2.9 mg/dL (ref 2.3–4.6)

## 2014-03-25 LAB — GLUCOSE, CAPILLARY
GLUCOSE-CAPILLARY: 189 mg/dL — AB (ref 70–99)
GLUCOSE-CAPILLARY: 276 mg/dL — AB (ref 70–99)
Glucose-Capillary: 235 mg/dL — ABNORMAL HIGH (ref 70–99)
Glucose-Capillary: 245 mg/dL — ABNORMAL HIGH (ref 70–99)
Glucose-Capillary: 248 mg/dL — ABNORMAL HIGH (ref 70–99)
Glucose-Capillary: 275 mg/dL — ABNORMAL HIGH (ref 70–99)

## 2014-03-25 LAB — URINE CULTURE
Colony Count: NO GROWTH
Culture: NO GROWTH

## 2014-03-25 LAB — HEMOGLOBIN AND HEMATOCRIT, BLOOD
HCT: 25.1 % — ABNORMAL LOW (ref 36.0–46.0)
Hemoglobin: 7.3 g/dL — ABNORMAL LOW (ref 12.0–15.0)

## 2014-03-25 MED ORDER — AMIODARONE HCL IN DEXTROSE 360-4.14 MG/200ML-% IV SOLN
60.0000 mg/h | INTRAVENOUS | Status: AC
  Administered 2014-03-25: 60 mg/h via INTRAVENOUS
  Filled 2014-03-25 (×2): qty 200

## 2014-03-25 MED ORDER — POTASSIUM CHLORIDE 20 MEQ/15ML (10%) PO LIQD
40.0000 meq | Freq: Every day | ORAL | Status: DC
  Administered 2014-03-26 (×2): 40 meq via ORAL
  Filled 2014-03-25 (×2): qty 30

## 2014-03-25 MED ORDER — HEPARIN (PORCINE) IN NACL 100-0.45 UNIT/ML-% IJ SOLN
1500.0000 [IU]/h | INTRAMUSCULAR | Status: DC
  Administered 2014-03-25: 1500 [IU]/h via INTRAVENOUS
  Filled 2014-03-25 (×2): qty 250

## 2014-03-25 MED ORDER — AMIODARONE HCL IN DEXTROSE 360-4.14 MG/200ML-% IV SOLN
30.0000 mg/h | INTRAVENOUS | Status: DC
  Administered 2014-03-26 – 2014-03-28 (×4): 30 mg/h via INTRAVENOUS
  Filled 2014-03-25 (×10): qty 200

## 2014-03-25 MED ORDER — AMIODARONE LOAD VIA INFUSION
150.0000 mg | Freq: Once | INTRAVENOUS | Status: AC
  Administered 2014-03-25: 150 mg via INTRAVENOUS
  Filled 2014-03-25: qty 83.34

## 2014-03-25 MED ORDER — DILTIAZEM HCL 60 MG PO TABS
60.0000 mg | ORAL_TABLET | Freq: Three times a day (TID) | ORAL | Status: DC
  Administered 2014-03-25 – 2014-03-30 (×15): 60 mg via ORAL
  Filled 2014-03-25 (×19): qty 1

## 2014-03-25 NOTE — Progress Notes (Signed)
EKG CRITICAL VALUE     12 lead EKG performed.  Critical value noted.  Dorothey Baseman, RN notified.   Georga Kaufmann, CCT 03/25/2014 11:54 AM

## 2014-03-25 NOTE — Progress Notes (Signed)
PULMONARY / CRITICAL CARE MEDICINE   Name: Paige Hardin MRN: [id number] DOB: [birth year]    ADMISSION DATE:  03/23/2014 CONSULTATION DATE:  03/23/2014  REFERRING MD : ED  PRIMARY SERVICE: PCCM  CHIEF COMPLAINT: SOB  BRIEF PATIENT DESCRIPTION:  The patient is a 68 year old woman with morbid obesity, severe COPD, lung nodules, DM-II, chronic diastolic HF (EF 60 % on 05/24/30), paroxysmal atrial fibrillation and s/p of cardioversion, who presents with worsening SOB and respiratory failure. Likely due to combinational for CHF and COPD exacerbation. Intubated in ED.   SIGNIFICANT EVENTS / STUDIES:  ETT 4/27>>>  LINES / TUBES:  PIV 4/27>>>  CULTURES:  Blood culture 4/27>>> Sputum culture 4/27>>>  Tracheal aspirate 4/27>>  Resp virus tracheal aspirate 4/27 >>>  Urine culture 4/27 >>> neg Urine legionella 4/27 >>> Urine strep 4/27 >>>  ANTIBIOTICS:  Vancomycin 4/27>>>  Cefpime 4/27>>>  INTERVAL HISTORY:   pH 7.5 overnight, Vt decreased. 1400cc UOP overnight.  Net negative 4L since admission. pSV this am  VITAL SIGNS:   Temp:  [98.4 F (36.9 C)-99.3 F (37.4 C)] 99.3 F (37.4 C) (04/29 0400) Pulse Rate:  [68-84] 75 (04/29 0600) Resp:  [12-27] 16 (04/29 0600) BP: (111-161)/(34-57) 137/46 mmHg (04/29 0600) SpO2:  [90 %-98 %] 98 % (04/29 0600) FiO2 (%):  [40 %] 40 % (04/29 0302) Weight:  [136.4 kg (300 lb 11.3 oz)] 136.4 kg (300 lb 11.3 oz) (04/29 0500)  HEMODYNAMICS:   VENTILATOR SETTINGS: Vent Mode:  [-] PRVC FiO2 (%):  [40 %] 40 % Set Rate:  [12 bmp] 12 bmp Vt Set:  [400 mL-530 mL] 400 mL PEEP:  [5 cmH20] 5 cmH20 Pressure Support:  [5 cmH20] 5 cmH20 Plateau Pressure:  [14 cmH20-18 cmH20] 18 cmH20  INTAKE / OUTPUT: Intake/Output     04/28 0701 - 04/29 0700 04/29 0701 - 04/30 0700   I.V. (mL/kg) 481 (3.5)    NG/GT 505.8    IV Piggyback 750    Total Intake(mL/kg) 1736.9 (12.7)    Urine (mL/kg/hr) 3475 (1.1)    Total Output 3475     Net -1738.1            PHYSICAL EXAMINATION: General:  Intubated on ventilator Neuro:  Awake and alert, RASS 0 HEENT:  ETT in place Cardiovascular:  RRR, +S1+S2 Lungs:  B/L rhonchi Abdomen:  +BS, soft, NT, ND Musculoskeletal:  Able to move extremities voluntarily Extremities:  2+ B/L lower extremity edema Skin:  Intact   LABS:  CBC  Recent Labs Lab 03/24/14 0747 03/24/14 1350 03/24/14 1845 03/25/14 0315  WBC 9.9 11.7*  --  11.8*  HGB 7.0* 7.1* 7.0* 7.0*  HCT 24.0* 23.7* 23.5* 24.2*  PLT 234 243  --  242   Coag's  Recent Labs Lab 03/23/14 2133  INR 1.70*   BMET  Recent Labs Lab 03/23/14 1715 03/24/14 0747 03/24/14 1845  NA 130* 136* 140  K 5.4* 4.2 4.3  CL 90* 92* 95*  CO2 27 34* 34*  BUN 21 22 24*  CREATININE 1.15* 1.06 1.06  GLUCOSE 297* 235* 212*   Electrolytes  Recent Labs Lab 03/23/14 1715  03/24/14 0747 03/24/14 1125 03/24/14 1845 03/25/14 0315  CALCIUM 9.0  --  8.6  --  8.8  --   MG  --   < > 2.2 2.1 2.2 2.1  PHOS  --   < > 3.7 3.8 3.9 3.1  < > = values in this interval not displayed. Sepsis Markers  Recent  Labs Lab 03/23/14 2133  LATICACIDVEN 0.9  PROCALCITON 0.40   ABG  Recent Labs Lab 03/25/14 0251 03/25/14 0409 03/25/14 0613  PHART 7.500* 7.518* 7.443  PCO2ART 49.2* 48.1* 57.0*  PO2ART 69.0* 68.0* 79.0*   Liver Enzymes  Recent Labs Lab 03/23/14 1715  AST 26  ALT 24  ALKPHOS 93  BILITOT 0.9  ALBUMIN 3.3*   Cardiac Enzymes  Recent Labs Lab 03/23/14 1715 03/23/14 2133 03/24/14 0747 03/24/14 1400  TROPONINI  --  <0.30 <0.30 <0.30  PROBNP 2475.0*  --   --   --    Glucose  Recent Labs Lab 03/24/14 0805 03/24/14 1135 03/24/14 1540 03/24/14 1938 03/24/14 2336 03/25/14 0406  GLUCAP 247* 209* 138* 226* 235* 189*    Imaging Dg Chest Port 1 View  03/25/2014   CLINICAL DATA:  Respiratory failure.  EXAM: PORTABLE CHEST - 1 VIEW  COMPARISON:  DG CHEST 1V PORT dated 03/24/2014  FINDINGS: Endotracheal tube and NG tube in stable  position. Persistent severe cardiomegaly pulmonary venous congestion and bilateral diffuse pulmonary alveolar infiltrates present. Bilateral pleural effusions are most likely present. No significant interim change from prior exam. Findings consistent with congestive heart failure with pulmonary edema. No pneumothorax. No acute osseous abnormality .  IMPRESSION: 1. Stable line and tube positions. 2. Persistent congestive heart failure with prominent bilateral pulmonary alveolar edema and small bilateral pleural effusions. No interim improvement.   Electronically Signed   By: Marcello Moores  Register   On: 03/25/2014 07:24   Dg Chest Port 1 View  03/24/2014   CLINICAL DATA:  Acute respiratory failure.  EXAM: PORTABLE CHEST - 1 VIEW  COMPARISON:  03/23/2014 and 03/06/2014  FINDINGS: Endotracheal tube is in good position 3.6 cm above the carina. NG tube tip is below the diaphragm.  There is chronic cardiomegaly. Pulmonary vascularity is less prominent and the diffuse interstitial and alveolar accentuation has diminished suggesting that this represents pulmonary edema. There small bilateral pleural effusions.  IMPRESSION: Improved interstitial and alveolar infiltrates. Small effusions. Findings may represent pulmonary edema.   Electronically Signed   By: Rozetta Nunnery M.D.   On: 03/24/2014 08:06   Dg Chest Portable 1 View  03/23/2014   CLINICAL DATA:  ET tube placement  EXAM: PORTABLE CHEST - 1 VIEW  COMPARISON:  DG CHEST 1V PORT dated 03/23/2014  FINDINGS: There is an endotracheal tube with the tip 2.5 cm above the carina. There are bilateral interstitial and alveolar airspace opacities. There are bilateral small pleural effusions. There is no pneumothorax. There is stable cardiomegaly. There is aortic atherosclerosis.  IMPRESSION: 1. Endotracheal tube with the tip 2.5 cm above the carina. 2. Bilateral interstitial and alveolar airspace opacities which may be secondary to multilobar pneumonia versus pulmonary edema.    Electronically Signed   By: Kathreen Devoid   On: 03/23/2014 18:26   Dg Chest Port 1 View  03/23/2014   CLINICAL DATA:  Short of breath  EXAM: PORTABLE CHEST - 1 VIEW  COMPARISON:  DG CHEST 2 VIEW dated 03/06/2014  FINDINGS: Cardiomegaly. Diffuse edema. Vascular congestion. Increased opacity at the lung bases most consistent with airspace edema. No pneumothorax. No definite pleural effusion.  IMPRESSION: CHF with interstitial edema and some basilar airspace edema.   Electronically Signed   By: Maryclare Bean M.D.   On: 03/23/2014 17:35   Dg Abd Portable 1v  03/23/2014   CLINICAL DATA:  Orogastric tube placement.  EXAM: PORTABLE ABDOMEN - 1 VIEW  COMPARISON:  None.  FINDINGS: The  patient's enteric tube is seen ending about the body of the stomach.  The visualized bowel gas pattern is grossly unremarkable. No definite intra-abdominal air is identified, though evaluation for free air is limited on a single supine view. No acute osseous abnormalities are seen. A small left pleural effusion is noted, with left basilar airspace opacity.  No acute osseous abnormalities are identified.  IMPRESSION: Enteric tube noted ending about the body of the stomach.   Electronically Signed   By: Garald Balding M.D.   On: 03/23/2014 21:17   CXR: 04/29 worsening pulmonary edema and small B/L pleural effusions.  ASSESSMENT / PLAN:  PULMONARY  A:  - Acute hypercapnic respiratory failure 2/2 to acute exacerbation of dHF (EF 60% TTE 02/15) with AECOPD likely contributing; alkalotic overnight, VT decreased and ABG improved this AM. - PAH, 53 mmHg  P:  - Mechanical ventilation - VAP bundle - SBT daily > goal extubation 4/29 - Lasix 80mg  BID  - Albuterol neb q2h prn - Duoneb q6h - IV Solu-medrol 60mg  q8h    CARDIOVASCULAR  A:  - Hx of PAF (s/p of cardioversion) now NSR with heart rate of 70-80s - Acute on chronic dHF--> clinically volume overloaded at admission, proBNP 2475  P:  - Continue home medications for Afib  including amiodarone, metoprolol, and Cardizem > note prolonged QTc - Aggressive diuresis with Lasix  - Follow up BMP/Mg/Phos BID, replete electrolytes as needed  - Holding home Pradaxa, continue Lovenox  - VTE ppx - SCDs and Lovenox   RENAL  A:  - CKD3, Cr stable at 1.15 admission; AM lab pending - Hyperkalemia, resolved, AM lab pending P:  - Diuresis as above, adjust based on renal status - Monitor BMP    GASTROINTESTINAL  A:  - GERD  P:  - IV Pepcid - assess for PO once extubated   HEMATOLOGIC  A:  - Leukocytosis, resolved WBC 17.3 --> 9.9--> 11.8, no clear source of infection, upward trend likely influenced by steroids  - Chronic anemia, baseline 7-9; this admission 8.2-->7.7-->7.0, stable at 7.0 overnight, no gross blood loss noted P:  - Monitor for s/s of infection or bleeding - Monitor CBC     INFECTIOUS  A:  - Leukocytosis, productive cough with [place] sputum, no fever or chills; lactic acid 0.9 reassuring, PCT 0.4 P:  - Abx as above - Pan-culture pending - Trend PCT    ENDOCRINE  A:  - DM type 2, CBGs > 200, steroid likely contributing P:  - SSI resistant q4h with TF coverage while intubated, Lantus 10 units BID; CBG goal 140 to 180  - hold PO DM regimen for now until we assess ability to take adequate PO - Monitor CBG  NEUROLOGIC  A:  - Acute hypoxic encephalopathy 2/2 to acute respiratory failure, resolved  P:  - Neuro check q4h  - Propofol gtt for sedation > d/c - Daily WUA   Duwaine Maxin, DO IMTS, PGY1  35 minutes CC time  Baltazar Apo, MD, PhD 03/25/2014, 10:05 AM [facility] [phone] or if no answer [phone]

## 2014-03-25 NOTE — Consult Note (Signed)
Reason for Consult: CHF   Referring Physician: CCM PCP: Stephens Shire, MD Primary Cardiologist:Dr. Janaa Acero is an 68 y.o. female.    Chief Complaint: admitted  By CCM last pm for significant SOB.  She was sent to ER by Dr. Aundra Dubin for Respiratory failure due to CHF,  COPD exacerbation.     HPI:   68 yo with history of COPD on home oxygen, paroxysmal atrial fibrillation, and chronic diastolic CHF presented to OV with Dr. Aundra Dubin after prolonged hospitalization at [facility]. She was admitted for several weeks from 3/15 to 4/15 with atrial fibrillation/RVR, acute on chronic diastolic CHF, and COPD exacerbation. She ended up being cardioverted in the hospital and remained in NSR yesterday on amiodarone. She was diuresed with IV Lasix. After the prolonged hospitalization, she was sent to [facility].  When seen in the office yesterday she had acute respiratory failure: Patient was hypoxic and tachypneic. She was short of breath at rest. Distant lung sounds. COPD may play a role. However, she is certainly volume overloaded with significant JVD.  She was sent to ER.  Initially placed on BiPAP then with worsening mental status changes and we were no longer able to protect her airway she was intubated.    Today she went into atrial fib today with RVR, she is now extubated and taking po meds.  She is not aware she is in Atrial fib.  Her H/H 7.0/24.  Neg. Troponins X 2.  EKG a fib rate 116 mild st depression V2-V5 though similar last time she was in atrial fib.  On last admit Dr. Lovena Le saw in consult.    02/24/14 "he constellation of the above problems coupled with her multiple admissions for respiratory failure in the setting of atrial fibrillation with a rapid ventricular response, in conjunction with prolongation of the QT interval, in conjunction with severe underlying lung disease makes all antiarrhythmic options for maintenance of sinus rhythm improbable at best. I've  discussed the treatment options in detail with the patient. AV node ablation, and insertion of a ventricular pacemaker is warranted. I've discussed the risk and benefits of this procedure with the patient in detail. While she is not a great candidate for this procedure with her multiple comorbidities, I think is her only good option. She is reflecting on this and will let us know if she would like to proceed with pacemaker and AV node ablation. For now, would attempt to optimize her fluid status, continue intravenous calcium channel blockers, and treat her underlying lung disease."  She was discharged on amiodarone 400 mg BID X 7 days then 437m daily which she is currently on.  Also with dilt, was on 180 mg CD but currently on 60 mg po every 8 hours.  No chest pain, HR at times to 108 but with talking up to 120 at least.  Past Medical History  Diagnosis Date  . Chronic low back pain   . Emphysema   . Paroxysmal atrial fibrillation   . COPD (chronic obstructive pulmonary disease)   . Hyperlipidemia   . Diastolic congestive heart failure   . Obese   . Carotid atherosclerosis   . Lung nodules   . Hoarseness, chronic   . Pneumonia 1990's    "once"  . Type 2 diabetes mellitus   . GERD (gastroesophageal reflux disease)   . Arthritis     "joints" (01/14/2014)  . Depression     "maybe" (  01/14/2014    Past Surgical History  Procedure Laterality Date  . Esophagogastroduodenoscopy  01/26/2012    Procedure: ESOPHAGOGASTRODUODENOSCOPY (EGD);  Surgeon: Beryle Beams, MD;  Location: [facility];  Service: Endoscopy;  Laterality: N/A;  . Colonoscopy  01/26/2012    Procedure: COLONOSCOPY;  Surgeon: Beryle Beams, MD;  Location: [facility];  Service: Endoscopy;  Laterality: N/A;  . Breast biopsy Left ~ 1980    "solid"  . Breast lumpectomy Left ~ 1980    "removed a mass; it was benign" (01/14/2014)  . Total abdominal hysterectomy  1994  . Cardioversion N/A 03/11/2014    Procedure: CARDIOVERSION;   Surgeon: Sanda Klein, MD;  Location: [facility] OR;  Service: Cardiovascular;  Laterality: N/A;    Family History  Problem Relation Age of Onset  . Breast cancer Sister   . Heart disease    . Hodgkin's lymphoma    . Asthma Mother    Social History:  reports that she quit smoking about 5 years ago. Her smoking use included Cigarettes. She has a 135 pack-year smoking history. She has never used smokeless tobacco. She reports that she drinks alcohol. She reports that she does not use illicit drugs.  Allergies:  Allergies  Allergen Reactions  . Ace Inhibitors Cough  . Clindamycin Rash    Medications Prior to Admission  Medication Sig Dispense Refill  . acetaminophen (TYLENOL) 325 MG tablet Take 325 mg by mouth daily as needed for mild pain.       . Amino Acids-Protein Hydrolys (FEEDING SUPPLEMENT, PRO-STAT SUGAR FREE 64,) LIQD Take 30 mLs by mouth 2 (two) times daily.      Marland Kitchen amiodarone (PACERONE) 200 MG tablet Take 2 tabs (400 mg) twice daily for 7 days, then take 2 tabs (400 mg) once daily for 14 days then take 1 tab (200 mg) once daily thereafter  90 tablet  3  . Ascorbic Acid (VITAMIN C) 500 MG CAPS Take 500 mg by mouth daily.       . bisacodyl (DULCOLAX) 10 MG suppository Place 1 suppository (10 mg total) rectally daily as needed for moderate constipation.  12 suppository  0  . budesonide-formoterol (SYMBICORT) 80-4.5 MCG/ACT inhaler Inhale 2 puffs into the lungs 2 (two) times daily.  1 Inhaler  5  . Ca & Phos-Vit D-Mag (CALCIUM) [phone] TABS Take 1 tablet by mouth daily.       . calcium-vitamin D (OSCAL WITH D) 500-200 MG-UNIT per tablet Take 1 tablet by mouth daily with breakfast.      . Cholecalciferol (VITAMIN D) 2000 UNITS tablet Take 2,000 Units by mouth daily.      . dabigatran (PRADAXA) 150 MG CAPS capsule Take 1 capsule (150 mg total) by mouth 2 (two) times daily.  60 capsule    . diltiazem (CARDIZEM CD) 180 MG 24 hr capsule Take 1 capsule (180 mg total) by mouth daily.  30  capsule  3  . ezetimibe-simvastatin (VYTORIN) 10-40 MG per tablet Take 1 tablet by mouth at bedtime.       . furosemide (LASIX) 40 MG tablet Take 40-80 mg by mouth See admin instructions. Take 61m evey other day alternating with 82mevery other day      . glyBURIDE micronized (GLYNASE) 3 MG tablet Take 3 mg by mouth daily.       . Marland KitchenuaiFENesin-codeine 100-10 MG/5ML syrup Take 5 mLs by mouth every 6 (six) hours as needed for cough.      . lansoprazole (PREVACID) 30 MG  capsule Take 30 mg by mouth daily at 12 noon.      . levalbuterol (XOPENEX HFA) 45 MCG/ACT inhaler Inhale 2 puffs into the lungs every 6 (six) hours as needed for wheezing.  1 Inhaler  12  . levalbuterol (XOPENEX) 0.63 MG/3ML nebulizer solution Take 3 mLs (0.63 mg total) by nebulization every 6 (six) hours as needed for wheezing or shortness of breath.  3 mL  12  . LORazepam (ATIVAN) 0.5 MG tablet Take 0.25 mg by mouth every 6 (six) hours as needed for anxiety.      . Magnesium-Zinc 133.33-5 MG TABS Take 133 mg by mouth daily.      . metoprolol (LOPRESSOR) 50 MG tablet Take 1 tablet (50 mg total) by mouth 2 (two) times daily.  60 tablet  3  . Multiple Vitamins-Iron (MULTIVITAMIN/IRON PO) Take 1 tablet by mouth daily.      Marland Kitchen omeprazole (PRILOSEC) 20 MG capsule Take 20 mg by mouth daily.      . potassium chloride SA (K-DUR,KLOR-CON) 20 MEQ tablet Take 20 mEq by mouth daily.       . sitaGLIPtin (JANUVIA) 100 MG tablet Take 100 mg by mouth daily.      . sodium chloride (OCEAN) 0.65 % SOLN nasal spray Place 1 spray into both nostrils as needed for congestion.      Marland Kitchen tiotropium (SPIRIVA) 18 MCG inhalation capsule Place 18 mcg into inhaler and inhale daily.      . vitamin B-12 (CYANOCOBALAMIN) 500 MCG tablet Take 500 mcg by mouth daily.        Results for orders placed during the hospital encounter of 03/23/14 (from the past 48 hour(s))  CBC     Status: Abnormal   Collection Time    03/23/14  5:15 PM      Result Value Ref Range   WBC  17.3 (*) 4.0 - 10.5 K/uL   RBC 3.20 (*) 3.87 - 5.11 MIL/uL   Hemoglobin 8.2 (*) 12.0 - 15.0 g/dL   HCT 27.9 (*) 36.0 - 46.0 %   MCV 87.2  78.0 - 100.0 fL   MCH 25.6 (*) 26.0 - 34.0 pg   MCHC 29.4 (*) 30.0 - 36.0 g/dL   RDW 17.8 (*) 11.5 - 15.5 %   Platelets 385  150 - 400 K/uL  BASIC METABOLIC PANEL     Status: Abnormal   Collection Time    03/23/14  5:15 PM      Result Value Ref Range   Sodium 130 (*) 137 - 147 mEq/L   Potassium 5.4 (*) 3.7 - 5.3 mEq/L   Chloride 90 (*) 96 - 112 mEq/L   CO2 27  19 - 32 mEq/L   Glucose, Bld 297 (*) 70 - 99 mg/dL   BUN 21  6 - 23 mg/dL   Creatinine, Ser 1.15 (*) 0.50 - 1.10 mg/dL   Calcium 9.0  8.4 - 10.5 mg/dL   GFR calc non Af Amer 48 (*) >90 mL/min   GFR calc Af Amer 55 (*) >90 mL/min   Comment: (NOTE)     The eGFR has been calculated using the CKD EPI equation.     This calculation has not been validated in all clinical situations.     eGFR's persistently <90 mL/min signify possible Chronic Kidney     Disease.  PRO B NATRIURETIC PEPTIDE     Status: Abnormal   Collection Time    03/23/14  5:15 PM      Result  Value Ref Range   Pro B Natriuretic peptide (BNP) 2475.0 (*) 0 - 125 pg/mL  HEPATIC FUNCTION PANEL     Status: Abnormal   Collection Time    03/23/14  5:15 PM      Result Value Ref Range   Total Protein 7.2  6.0 - 8.3 g/dL   Albumin 3.3 (*) 3.5 - 5.2 g/dL   AST 26  0 - 37 U/L   ALT 24  0 - 35 U/L   Alkaline Phosphatase 93  39 - 117 U/L   Total Bilirubin 0.9  0.3 - 1.2 mg/dL   Bilirubin, Direct 0.4 (*) 0.0 - 0.3 mg/dL   Indirect Bilirubin 0.5  0.3 - 0.9 mg/dL  I-STAT ARTERIAL BLOOD GAS, ED     Status: Abnormal   Collection Time    03/23/14  5:20 PM      Result Value Ref Range   pH, Arterial 7.179 (*) 7.350 - 7.450   pCO2 arterial 93.1 (*) 35.0 - 45.0 mmHg   pO2, Arterial 83.0  80.0 - 100.0 mmHg   Bicarbonate 34.7 (*) 20.0 - 24.0 mEq/L   TCO2 37  0 - 100 mmol/L   O2 Saturation 92.0     Acid-Base Excess 4.0 (*) 0.0 - 2.0  mmol/L   Patient temperature 98.6 F     Collection site RADIAL, ALLEN'S TEST ACCEPTABLE     Drawn by Operator     Sample type ARTERIAL     Comment NOTIFIED [place], ED     Status: None   Collection Time    03/23/14  5:44 PM      Result Value Ref Range   Troponin i, poc 0.01  0.00 - 0.08 ng/mL   Comment 3            Comment: Due to the release kinetics of cTnI,     a negative result within the first hours     of the onset of symptoms does not rule out     myocardial infarction with certainty.     If myocardial infarction is still suspected,     repeat the test at appropriate intervals.  URINALYSIS, ROUTINE W REFLEX MICROSCOPIC     Status: Abnormal   Collection Time    03/23/14  7:15 PM      Result Value Ref Range   Color, Urine YELLOW  YELLOW   APPearance CLOUDY (*) CLEAR   Specific Gravity, Urine 1.016  1.005 - 1.030   pH 5.0  5.0 - 8.0   Glucose, UA NEGATIVE  NEGATIVE mg/dL   Hgb urine dipstick NEGATIVE  NEGATIVE   Bilirubin Urine NEGATIVE  NEGATIVE   Ketones, ur NEGATIVE  NEGATIVE mg/dL   Protein, ur 30 (*) NEGATIVE mg/dL   Urobilinogen, UA 1.0  0.0 - 1.0 mg/dL   Nitrite NEGATIVE  NEGATIVE   Leukocytes, UA SMALL (*) NEGATIVE  URINE MICROSCOPIC-ADD ON     Status: Abnormal   Collection Time    03/23/14  7:15 PM      Result Value Ref Range   Squamous Epithelial / LPF RARE  RARE   WBC, UA 0-2  <3 WBC/hpf   Bacteria, UA RARE  RARE   Casts HYALINE CASTS (*) NEGATIVE   Urine-Other AMORPHOUS URATES/PHOSPHATES     Comment: RARE YEAST  URINE CULTURE     Status: None   Collection Time    03/23/14  7:15 PM      Result Value  Ref Range   Specimen Description URINE, CATHETERIZED     Special Requests NONE     Culture  Setup Time       Value: 03/23/2014 22:51     Performed at [facility]       Value: NO GROWTH     Performed at [facility]   Culture       Value: NO GROWTH     Performed at [facility]   Report  Status 03/25/2014 FINAL    CULTURE, RESPIRATORY (NON-EXPECTORATED)     Status: None   Collection Time    03/23/14  9:10 PM      Result Value Ref Range   Specimen Description TRACHEAL ASPIRATE     Special Requests NONE     Gram Stain       Value: NO WBC SEEN     NO SQUAMOUS EPITHELIAL CELLS SEEN     NO ORGANISMS SEEN     Performed at [facility]   Culture       Value: NO GROWTH 1 DAY     Performed at [facility]   Report Status PENDING    MRSA PCR SCREENING     Status: None   Collection Time    03/23/14  9:11 PM      Result Value Ref Range   MRSA by PCR NEGATIVE  NEGATIVE   Comment:            The GeneXpert MRSA Assay (FDA     approved for NASAL specimens     only), is one component of a     comprehensive MRSA colonization     surveillance program. It is not     intended to diagnose MRSA     infection nor to guide or     monitor treatment for     MRSA infections.  GLUCOSE, CAPILLARY     Status: Abnormal   Collection Time    03/23/14  9:25 PM      Result Value Ref Range   Glucose-Capillary 298 (*) 70 - 99 mg/dL  PHOSPHORUS     Status: Abnormal   Collection Time    03/23/14  9:33 PM      Result Value Ref Range   Phosphorus 5.0 (*) 2.3 - 4.6 mg/dL  MAGNESIUM     Status: None   Collection Time    03/23/14  9:33 PM      Result Value Ref Range   Magnesium 2.4  1.5 - 2.5 mg/dL  LACTIC ACID, PLASMA     Status: None   Collection Time    03/23/14  9:33 PM      Result Value Ref Range   Lactic Acid, Venous 0.9  0.5 - 2.2 mmol/L  CBC WITH DIFFERENTIAL     Status: Abnormal   Collection Time    03/23/14  9:33 PM      Result Value Ref Range   WBC 15.3 (*) 4.0 - 10.5 K/uL   RBC 3.01 (*) 3.87 - 5.11 MIL/uL   Hemoglobin 7.7 (*) 12.0 - 15.0 g/dL   HCT 26.4 (*) 36.0 - 46.0 %   MCV 87.7  78.0 - 100.0 fL   MCH 25.6 (*) 26.0 - 34.0 pg   MCHC 29.2 (*) 30.0 - 36.0 g/dL   RDW 18.0 (*) 11.5 - 15.5 %   Platelets 286  150 - 400 K/uL   Comment: DELTA CHECK NOTED  REPEATED TO VERIFY     SPECIMEN CHECKED FOR CLOTS   Neutrophils Relative % 96 (*) 43 - 77 %   Lymphocytes Relative 2 (*) 12 - 46 %   Monocytes Relative 2 (*) 3 - 12 %   Eosinophils Relative 0  0 - 5 %   Basophils Relative 0  0 - 1 %   Neutro Abs 14.7 (*) 1.7 - 7.7 K/uL   Lymphs Abs 0.3 (*) 0.7 - 4.0 K/uL   Monocytes Absolute 0.3  0.1 - 1.0 K/uL   Eosinophils Absolute 0.0  0.0 - 0.7 K/uL   Basophils Absolute 0.0  0.0 - 0.1 K/uL   RBC Morphology POLYCHROMASIA PRESENT     Comment: BASOPHILIC STIPPLING   WBC Morphology MILD LEFT SHIFT (1-5% METAS, OCC MYELO, OCC BANDS)     Smear Review PLATELETS APPEAR ADEQUATE    PROTIME-INR     Status: Abnormal   Collection Time    03/23/14  9:33 PM      Result Value Ref Range   Prothrombin Time 19.5 (*) 11.6 - 15.2 seconds   INR 1.70 (*) 0.00 - 1.49  CULTURE, BLOOD (ROUTINE X 2)     Status: None   Collection Time    03/23/14  9:33 PM      Result Value Ref Range   Specimen Description BLOOD LEFT HAND     Special Requests BOTTLES DRAWN AEROBIC ONLY 6CC     Culture  Setup Time       Value: 03/24/2014 01:14     Performed at [facility]   Culture       Value:        BLOOD CULTURE RECEIVED NO GROWTH TO DATE CULTURE WILL BE HELD FOR 5 DAYS BEFORE ISSUING A FINAL NEGATIVE REPORT     Performed at [facility]   Report Status PENDING    CULTURE, BLOOD (ROUTINE X 2)     Status: None   Collection Time    03/23/14  9:33 PM      Result Value Ref Range   Specimen Description BLOOD RIGHT RADIAL ARM     Special Requests BOTTLES DRAWN AEROBIC ONLY 3CC     Culture  Setup Time       Value: 03/24/2014 01:14     Performed at [facility]   Culture       Value:        BLOOD CULTURE RECEIVED NO GROWTH TO DATE CULTURE WILL BE HELD FOR 5 DAYS BEFORE ISSUING A FINAL NEGATIVE REPORT     Performed at [facility]   Report Status PENDING    TROPONIN I     Status: None   Collection Time    03/23/14  9:33 PM      Result Value Ref  Range   Troponin I <0.30  <0.30 ng/mL   Comment:            Due to the release kinetics of cTnI,     a negative result within the first hours     of the onset of symptoms does not rule out     myocardial infarction with certainty.     If myocardial infarction is still suspected,     repeat the test at appropriate intervals.  PROCALCITONIN     Status: None   Collection Time    03/23/14  9:33 PM      Result Value Ref Range   Procalcitonin 0.40  Comment:            Interpretation:     PCT (Procalcitonin) <= 0.5 ng/mL:     Systemic infection (sepsis) is not likely.     Local bacterial infection is possible.     (NOTE)             ICU PCT Algorithm               Non ICU PCT Algorithm        ----------------------------     ------------------------------             PCT < 0.25 ng/mL                 PCT < 0.1 ng/mL         Stopping of antibiotics            Stopping of antibiotics           strongly encouraged.               strongly encouraged.        ----------------------------     ------------------------------           PCT level decrease by               PCT < 0.25 ng/mL           >= 80% from peak PCT           OR PCT 0.25 - 0.5 ng/mL          Stopping of antibiotics                                                 encouraged.         Stopping of antibiotics               encouraged.        ----------------------------     ------------------------------           PCT level decrease by              PCT >= 0.25 ng/mL           < 80% from peak PCT            AND PCT >= 0.5 ng/mL            Continuing antibiotics                                                  encouraged.           Continuing antibiotics                encouraged.        ----------------------------     ------------------------------         PCT level increase compared          PCT > 0.5 ng/mL             with peak PCT AND              PCT >= 0.5 ng/mL             Escalation of antibiotics  strongly encouraged.          Escalation of antibiotics            strongly encouraged.  POCT I-STAT 3, ART BLOOD GAS (G3+)     Status: Abnormal   Collection Time    03/23/14  9:40 PM      Result Value Ref Range   pH, Arterial 7.249 (*) 7.350 - 7.450   pCO2 arterial 70.2 (*) 35.0 - 45.0 mmHg   pO2, Arterial 63.0 (*) 80.0 - 100.0 mmHg   Bicarbonate 30.9 (*) 20.0 - 24.0 mEq/L   TCO2 33  0 - 100 mmol/L   O2 Saturation 88.0     Acid-Base Excess 2.0  0.0 - 2.0 mmol/L   Patient temperature 97.5 F     Collection site RADIAL, ALLEN'S TEST ACCEPTABLE     Drawn by Operator     Sample type ARTERIAL     Comment NOTIFIED PHYSICIAN    GLUCOSE, CAPILLARY     Status: Abnormal   Collection Time    03/23/14 11:51 PM      Result Value Ref Range   Glucose-Capillary 314 (*) 70 - 99 mg/dL   Comment 1 Documented in Chart     Comment 2 Notify RN    GLUCOSE, CAPILLARY     Status: Abnormal   Collection Time    03/24/14  3:48 AM      Result Value Ref Range   Glucose-Capillary 274 (*) 70 - 99 mg/dL   Comment 1 Notify RN     Comment 2 Documented in Chart    POCT I-STAT 3, ART BLOOD GAS (G3+)     Status: Abnormal   Collection Time    03/24/14  7:35 AM      Result Value Ref Range   pH, Arterial 7.397  7.350 - 7.450   pCO2 arterial 58.6 (*) 35.0 - 45.0 mmHg   pO2, Arterial 52.0 (*) 80.0 - 100.0 mmHg   Bicarbonate 36.0 (*) 20.0 - 24.0 mEq/L   TCO2 38  0 - 100 mmol/L   O2 Saturation 85.0     Acid-Base Excess 10.0 (*) 0.0 - 2.0 mmol/L   Collection site RADIAL, ALLEN'S TEST ACCEPTABLE     Drawn by Operator     Sample type ARTERIAL     Comment NOTIFIED PHYSICIAN    BASIC METABOLIC PANEL     Status: Abnormal   Collection Time    03/24/14  7:47 AM      Result Value Ref Range   Sodium 136 (*) 137 - 147 mEq/L   Potassium 4.2  3.7 - 5.3 mEq/L   Comment: DELTA CHECK NOTED   Chloride 92 (*) 96 - 112 mEq/L   CO2 34 (*) 19 - 32 mEq/L   Glucose, Bld 235 (*) 70 - 99 mg/dL   BUN 22   6 - 23 mg/dL   Creatinine, Ser 1.06  0.50 - 1.10 mg/dL   Calcium 8.6  8.4 - 10.5 mg/dL   GFR calc non Af Amer 53 (*) >90 mL/min   GFR calc Af Amer 61 (*) >90 mL/min   Comment: (NOTE)     The eGFR has been calculated using the CKD EPI equation.     This calculation has not been validated in all clinical situations.     eGFR's persistently <90 mL/min signify possible Chronic Kidney     Disease.  MAGNESIUM     Status: None   Collection Time    03/24/14  7:47 AM      Result Value Ref Range   Magnesium 2.2  1.5 - 2.5 mg/dL  PHOSPHORUS     Status: None   Collection Time    03/24/14  7:47 AM      Result Value Ref Range   Phosphorus 3.7  2.3 - 4.6 mg/dL  TROPONIN I     Status: None   Collection Time    03/24/14  7:47 AM      Result Value Ref Range   Troponin I <0.30  <0.30 ng/mL   Comment:            Due to the release kinetics of cTnI,     a negative result within the first hours     of the onset of symptoms does not rule out     myocardial infarction with certainty.     If myocardial infarction is still suspected,     repeat the test at appropriate intervals.  CBC WITH DIFFERENTIAL     Status: Abnormal   Collection Time    03/24/14  7:47 AM      Result Value Ref Range   WBC 9.9  4.0 - 10.5 K/uL   RBC 2.73 (*) 3.87 - 5.11 MIL/uL   Hemoglobin 7.0 (*) 12.0 - 15.0 g/dL   HCT 24.0 (*) 36.0 - 46.0 %   MCV 87.9  78.0 - 100.0 fL   MCH 25.6 (*) 26.0 - 34.0 pg   MCHC 29.2 (*) 30.0 - 36.0 g/dL   RDW 17.9 (*) 11.5 - 15.5 %   Platelets 234  150 - 400 K/uL   Neutrophils Relative % 95 (*) 43 - 77 %   Neutro Abs 9.4 (*) 1.7 - 7.7 K/uL   Lymphocytes Relative 3 (*) 12 - 46 %   Lymphs Abs 0.3 (*) 0.7 - 4.0 K/uL   Monocytes Relative 2 (*) 3 - 12 %   Monocytes Absolute 0.2  0.1 - 1.0 K/uL   Eosinophils Relative 0  0 - 5 %   Eosinophils Absolute 0.0  0.0 - 0.7 K/uL   Basophils Relative 0  0 - 1 %   Basophils Absolute 0.0  0.0 - 0.1 K/uL  GLUCOSE, CAPILLARY     Status: Abnormal    Collection Time    03/24/14  8:05 AM      Result Value Ref Range   Glucose-Capillary 247 (*) 70 - 99 mg/dL  TRIGLYCERIDES     Status: None   Collection Time    03/24/14 11:25 AM      Result Value Ref Range   Triglycerides 50  <150 mg/dL  MAGNESIUM     Status: None   Collection Time    03/24/14 11:25 AM      Result Value Ref Range   Magnesium 2.1  1.5 - 2.5 mg/dL  PHOSPHORUS     Status: None   Collection Time    03/24/14 11:25 AM      Result Value Ref Range   Phosphorus 3.8  2.3 - 4.6 mg/dL  GLUCOSE, CAPILLARY     Status: Abnormal   Collection Time    03/24/14 11:35 AM      Result Value Ref Range   Glucose-Capillary 209 (*) 70 - 99 mg/dL  CBC     Status: Abnormal   Collection Time    03/24/14  1:50 PM      Result Value Ref Range   WBC 11.7 (*) 4.0 - 10.5 K/uL  RBC 2.72 (*) 3.87 - 5.11 MIL/uL   Hemoglobin 7.1 (*) 12.0 - 15.0 g/dL   HCT 23.7 (*) 36.0 - 46.0 %   MCV 87.1  78.0 - 100.0 fL   MCH 26.1  26.0 - 34.0 pg   MCHC 30.0  30.0 - 36.0 g/dL   RDW 18.0 (*) 11.5 - 15.5 %   Platelets 243  150 - 400 K/uL  TROPONIN I     Status: None   Collection Time    03/24/14  2:00 PM      Result Value Ref Range   Troponin I <0.30  <0.30 ng/mL   Comment:            Due to the release kinetics of cTnI,     a negative result within the first hours     of the onset of symptoms does not rule out     myocardial infarction with certainty.     If myocardial infarction is still suspected,     repeat the test at appropriate intervals.  TYPE AND SCREEN     Status: None   Collection Time    03/24/14  2:30 PM      Result Value Ref Range   ABO/RH(D) A POS     Antibody Screen NEG     Sample Expiration 03/27/2014     Unit Number [id number]     Blood Component Type RED CELLS,LR     Unit division 00     Status of Unit ALLOCATED     Transfusion Status OK TO TRANSFUSE     Crossmatch Result Compatible    ABO/RH     Status: None   Collection Time    03/24/14  2:30 PM      Result Value  Ref Range   ABO/RH(D) A POS    PREPARE RBC (CROSSMATCH)     Status: None   Collection Time    03/24/14  2:30 PM      Result Value Ref Range   Order Confirmation ORDER PROCESSED BY BLOOD BANK    GLUCOSE, CAPILLARY     Status: Abnormal   Collection Time    03/24/14  3:40 PM      Result Value Ref Range   Glucose-Capillary 138 (*) 70 - 99 mg/dL  BASIC METABOLIC PANEL     Status: Abnormal   Collection Time    03/24/14  6:45 PM      Result Value Ref Range   Sodium 140  137 - 147 mEq/L   Potassium 4.3  3.7 - 5.3 mEq/L   Chloride 95 (*) 96 - 112 mEq/L   CO2 34 (*) 19 - 32 mEq/L   Glucose, Bld 212 (*) 70 - 99 mg/dL   BUN 24 (*) 6 - 23 mg/dL   Creatinine, Ser 1.06  0.50 - 1.10 mg/dL   Calcium 8.8  8.4 - 10.5 mg/dL   GFR calc non Af Amer 53 (*) >90 mL/min   GFR calc Af Amer 61 (*) >90 mL/min   Comment: (NOTE)     The eGFR has been calculated using the CKD EPI equation.     This calculation has not been validated in all clinical situations.     eGFR's persistently <90 mL/min signify possible Chronic Kidney     Disease.  MAGNESIUM     Status: None   Collection Time    03/24/14  6:45 PM      Result Value Ref Range   Magnesium 2.2  1.5 - 2.5 mg/dL  PHOSPHORUS     Status: None   Collection Time    03/24/14  6:45 PM      Result Value Ref Range   Phosphorus 3.9  2.3 - 4.6 mg/dL  HEMOGLOBIN AND HEMATOCRIT, BLOOD     Status: Abnormal   Collection Time    03/24/14  6:45 PM      Result Value Ref Range   Hemoglobin 7.0 (*) 12.0 - 15.0 g/dL   HCT 23.5 (*) 36.0 - 46.0 %  GLUCOSE, CAPILLARY     Status: Abnormal   Collection Time    03/24/14  7:38 PM      Result Value Ref Range   Glucose-Capillary 226 (*) 70 - 99 mg/dL  GLUCOSE, CAPILLARY     Status: Abnormal   Collection Time    03/24/14 11:36 PM      Result Value Ref Range   Glucose-Capillary 235 (*) 70 - 99 mg/dL   Comment 1 Notify RN    POCT I-STAT 3, ART BLOOD GAS (G3+)     Status: Abnormal   Collection Time    03/25/14  2:51 AM       Result Value Ref Range   pH, Arterial 7.500 (*) 7.350 - 7.450   pCO2 arterial 49.2 (*) 35.0 - 45.0 mmHg   pO2, Arterial 69.0 (*) 80.0 - 100.0 mmHg   Bicarbonate 38.2 (*) 20.0 - 24.0 mEq/L   TCO2 40  0 - 100 mmol/L   O2 Saturation 94.0     Acid-Base Excess 14.0 (*) 0.0 - 2.0 mmol/L   Patient temperature 99.2 F     Collection site RADIAL, ALLEN'S TEST ACCEPTABLE     Drawn by RT     Sample type ARTERIAL    MAGNESIUM     Status: None   Collection Time    03/25/14  3:15 AM      Result Value Ref Range   Magnesium 2.1  1.5 - 2.5 mg/dL  PHOSPHORUS     Status: None   Collection Time    03/25/14  3:15 AM      Result Value Ref Range   Phosphorus 3.1  2.3 - 4.6 mg/dL  CBC WITH DIFFERENTIAL     Status: Abnormal   Collection Time    03/25/14  3:15 AM      Result Value Ref Range   WBC 11.8 (*) 4.0 - 10.5 K/uL   RBC 2.73 (*) 3.87 - 5.11 MIL/uL   Hemoglobin 7.0 (*) 12.0 - 15.0 g/dL   HCT 24.2 (*) 36.0 - 46.0 %   MCV 88.6  78.0 - 100.0 fL   MCH 25.6 (*) 26.0 - 34.0 pg   MCHC 28.9 (*) 30.0 - 36.0 g/dL   RDW 18.2 (*) 11.5 - 15.5 %   Platelets 242  150 - 400 K/uL   Neutrophils Relative % 92 (*) 43 - 77 %   Neutro Abs 10.8 (*) 1.7 - 7.7 K/uL   Lymphocytes Relative 3 (*) 12 - 46 %   Lymphs Abs 0.4 (*) 0.7 - 4.0 K/uL   Monocytes Relative 5  3 - 12 %   Monocytes Absolute 0.6  0.1 - 1.0 K/uL   Eosinophils Relative 0  0 - 5 %   Eosinophils Absolute 0.0  0.0 - 0.7 K/uL   Basophils Relative 0  0 - 1 %   Basophils Absolute 0.0  0.0 - 0.1 K/uL  GLUCOSE, CAPILLARY     Status: Abnormal  Collection Time    03/25/14  4:06 AM      Result Value Ref Range   Glucose-Capillary 189 (*) 70 - 99 mg/dL   Comment 1 Notify RN    POCT I-STAT 3, ART BLOOD GAS (G3+)     Status: Abnormal   Collection Time    03/25/14  4:09 AM      Result Value Ref Range   pH, Arterial 7.518 (*) 7.350 - 7.450   pCO2 arterial 48.1 (*) 35.0 - 45.0 mmHg   pO2, Arterial 68.0 (*) 80.0 - 100.0 mmHg   Bicarbonate 38.9 (*)  20.0 - 24.0 mEq/L   TCO2 40  0 - 100 mmol/L   O2 Saturation 94.0     Acid-Base Excess 15.0 (*) 0.0 - 2.0 mmol/L   Patient temperature 99.3 F     Collection site RADIAL, ALLEN'S TEST ACCEPTABLE     Drawn by RT     Sample type ARTERIAL    POCT I-STAT 3, ART BLOOD GAS (G3+)     Status: Abnormal   Collection Time    03/25/14  6:13 AM      Result Value Ref Range   pH, Arterial 7.443  7.350 - 7.450   pCO2 arterial 57.0 (*) 35.0 - 45.0 mmHg   pO2, Arterial 79.0 (*) 80.0 - 100.0 mmHg   Bicarbonate 38.9 (*) 20.0 - 24.0 mEq/L   TCO2 41  0 - 100 mmol/L   O2 Saturation 95.0     Acid-Base Excess 13.0 (*) 0.0 - 2.0 mmol/L   Patient temperature 99.3 F     Collection site RADIAL, ALLEN'S TEST ACCEPTABLE     Drawn by RT     Sample type ARTERIAL    GLUCOSE, CAPILLARY     Status: Abnormal   Collection Time    03/25/14  8:04 AM      Result Value Ref Range   Glucose-Capillary 248 (*) 70 - 99 mg/dL  BASIC METABOLIC PANEL     Status: Abnormal   Collection Time    03/25/14  8:46 AM      Result Value Ref Range   Sodium 139  137 - 147 mEq/L   Potassium 3.8  3.7 - 5.3 mEq/L   Chloride 93 (*) 96 - 112 mEq/L   CO2 35 (*) 19 - 32 mEq/L   Glucose, Bld 242 (*) 70 - 99 mg/dL   BUN 29 (*) 6 - 23 mg/dL   Creatinine, Ser 1.04  0.50 - 1.10 mg/dL   Calcium 8.6  8.4 - 10.5 mg/dL   GFR calc non Af Amer 54 (*) >90 mL/min   GFR calc Af Amer 63 (*) >90 mL/min   Comment: (NOTE)     The eGFR has been calculated using the CKD EPI equation.     This calculation has not been validated in all clinical situations.     eGFR's persistently <90 mL/min signify possible Chronic Kidney     Disease.  MAGNESIUM     Status: None   Collection Time    03/25/14  8:46 AM      Result Value Ref Range   Magnesium 2.1  1.5 - 2.5 mg/dL  PHOSPHORUS     Status: None   Collection Time    03/25/14  8:46 AM      Result Value Ref Range   Phosphorus 3.5  2.3 - 4.6 mg/dL  GLUCOSE, CAPILLARY     Status: Abnormal   Collection Time     03/25/14  11:52 AM      Result Value Ref Range   Glucose-Capillary 276 (*) 70 - 99 mg/dL   Dg Chest Port 1 View  03/25/2014   CLINICAL DATA:  Respiratory failure.  EXAM: PORTABLE CHEST - 1 VIEW  COMPARISON:  DG CHEST 1V PORT dated 03/24/2014  FINDINGS: Endotracheal tube and NG tube in stable position. Persistent severe cardiomegaly pulmonary venous congestion and bilateral diffuse pulmonary alveolar infiltrates present. Bilateral pleural effusions are most likely present. No significant interim change from prior exam. Findings consistent with congestive heart failure with pulmonary edema. No pneumothorax. No acute osseous abnormality .  IMPRESSION: 1. Stable line and tube positions. 2. Persistent congestive heart failure with prominent bilateral pulmonary alveolar edema and small bilateral pleural effusions. No interim improvement.   Electronically Signed   By: Marcello Moores  Register   On: 03/25/2014 07:24   Dg Chest Port 1 View  03/24/2014   CLINICAL DATA:  Acute respiratory failure.  EXAM: PORTABLE CHEST - 1 VIEW  COMPARISON:  03/23/2014 and 03/06/2014  FINDINGS: Endotracheal tube is in good position 3.6 cm above the carina. NG tube tip is below the diaphragm.  There is chronic cardiomegaly. Pulmonary vascularity is less prominent and the diffuse interstitial and alveolar accentuation has diminished suggesting that this represents pulmonary edema. There small bilateral pleural effusions.  IMPRESSION: Improved interstitial and alveolar infiltrates. Small effusions. Findings may represent pulmonary edema.   Electronically Signed   By: Rozetta Nunnery M.D.   On: 03/24/2014 08:06   Dg Chest Portable 1 View  03/23/2014   CLINICAL DATA:  ET tube placement  EXAM: PORTABLE CHEST - 1 VIEW  COMPARISON:  DG CHEST 1V PORT dated 03/23/2014  FINDINGS: There is an endotracheal tube with the tip 2.5 cm above the carina. There are bilateral interstitial and alveolar airspace opacities. There are bilateral small pleural effusions.  There is no pneumothorax. There is stable cardiomegaly. There is aortic atherosclerosis.  IMPRESSION: 1. Endotracheal tube with the tip 2.5 cm above the carina. 2. Bilateral interstitial and alveolar airspace opacities which may be secondary to multilobar pneumonia versus pulmonary edema.   Electronically Signed   By: Kathreen Devoid   On: 03/23/2014 18:26   Dg Chest Port 1 View  03/23/2014   CLINICAL DATA:  Short of breath  EXAM: PORTABLE CHEST - 1 VIEW  COMPARISON:  DG CHEST 2 VIEW dated 03/06/2014  FINDINGS: Cardiomegaly. Diffuse edema. Vascular congestion. Increased opacity at the lung bases most consistent with airspace edema. No pneumothorax. No definite pleural effusion.  IMPRESSION: CHF with interstitial edema and some basilar airspace edema.   Electronically Signed   By: Maryclare Bean M.D.   On: 03/23/2014 17:35   Dg Abd Portable 1v  03/23/2014   CLINICAL DATA:  Orogastric tube placement.  EXAM: PORTABLE ABDOMEN - 1 VIEW  COMPARISON:  None.  FINDINGS: The patient's enteric tube is seen ending about the body of the stomach.  The visualized bowel gas pattern is grossly unremarkable. No definite intra-abdominal air is identified, though evaluation for free air is limited on a single supine view. No acute osseous abnormalities are seen. A small left pleural effusion is noted, with left basilar airspace opacity.  No acute osseous abnormalities are identified.  IMPRESSION: Enteric tube noted ending about the body of the stomach.   Electronically Signed   By: Garald Balding M.D.   On: 03/23/2014 21:17    ROS: General:no colds or fevers, no weight changes Skin:no rashes or ulcers HEENT:no blurred  vision, no congestion CV:see HPI PUL:see HPI GI:no diarrhea constipation or melena, no indigestion GU:no hematuria, no dysuria MS:no joint pain, no claudication Neuro:no syncope, no lightheadedness Endo:+ diabetes, no thyroid disease   Blood pressure 117/39, pulse 74, temperature 98.6 F (37 C), temperature  source Oral, resp. rate 19, height 5' 9"  (1.753 m), weight 300 lb 11.3 oz (136.4 kg), SpO2 98.00%. PE: General:Pleasant affect, NAD, morbidly obese pleasant woman Skin:Warm and dry, brisk capillary refill HEENT:normocephalic, sclera clear, mucus membranes moist Neck:supple, + JVD to jaw line, no bruits  Heart:irreg irreg, rapid without murmur, gallup, rub or click Lungs:clear ant. without rales, rhonchi, or wheezes NLG:XQJJH, soft, non tender, + BS, do not palpate liver spleen or masses Ext:tr lower ext edema- SCDs in place, 2+ pedal pulses, 2+ radial pulses Neuro:alert and oriented, MAE, follows commands, + facial symmetry    Assessment/Plan Principal Problem:   Acute respiratory failure with hypoxia, was extubated today Active Problems:   PAF (paroxysmal atrial fibrillation), DCCV 01/2014 maintained SR until today, A fib post extubation- will use IV amiodarone   Acute respiratory failure- improved   Acute on chronic diastolic congestive heart failure -4L   Chronic anticoagulation, currently on lovenox -pradaxa as outpatient   Morbid obesity- BMI 50   Chronic renal disease, stage III   COPD exacerbation    Cecilie Kicks  Nurse Practitioner Certified [place] Pager [phone] or after 5pm or weekends call [phone] 03/25/2014, 2:50 PM  Patient seen, examined. Available data reviewed. Agree with findings, assessment, and plan as outlined by Cecilie Kicks, NP. Changes made above where appropriate. The patient is alert, oriented, in NAD. She was extubated earlier today. Lungs are diminished/distant bilaterally. Heart tachy and irregular. Abdomen soft, NT, obese. Extremities with diffuse 1+ edema and JVP elevated.  Reviewed notes from her recent hospitalization. She has had recalcitrant atrial fibrillation despite amiodarone, av nodal blockade, cardioversion. Seen by Dr Lovena Le last admission and discussed possibility of AV node ablation/PPM. Will ask the EP service to  see her tomorrow for consideration of this. In meantime, will treat medically:   IV amiodarone for rate control  IV diuresis as ordered with lasix 80 mg BID  Change lovenox to IV heparin in case she needs pacemaker insertion (was on pradaxa as outpatient)  Will give clears tomorrow am and EP to see tomorrow  Sherren Mocha, M.D. 03/25/2014 4:59 PM

## 2014-03-25 NOTE — Progress Notes (Signed)
Notified Dr. Verlon Au of the IV team was unable to insert a new IV for patient to have her amiodarone.  Dr. Verlon Au spoke to Dr. Chase Caller and the decision was made to not insert a PICC  Line at this time just in case she needs a fistula in the future due to renal problems.  Per Dr. Verlon Au central line will be inserted later on today due to the lovenox being given at 10 am because of a risk of bleeding.   Priscella Mann, RN

## 2014-03-25 NOTE — Progress Notes (Signed)
ANTICOAGULATION CONSULT NOTE - Follow Up Consult  Pharmacy Consult for Lovenox--> Heparin Indication: atrial fibrillation  Allergies  Allergen Reactions  . Ace Inhibitors Cough  . Clindamycin Rash    Patient Measurements: Height: 5\' 9"  (175.3 cm) Weight: 300 lb 11.3 oz (136.4 kg) IBW/kg (Calculated) : 66.2 Heparin Dosing Weight: 98.8kg  Vital Signs: Temp: 98.2 F (36.8 C) (04/29 1529) Temp src: Oral (04/29 1529) BP: 106/53 mmHg (04/29 1600) Pulse Rate: 127 (04/29 1600)  Labs:  Recent Labs  03/23/14 2133 03/24/14 0747 03/24/14 1350 03/24/14 1400 03/24/14 1845 03/25/14 0315 03/25/14 0846  HGB 7.7* 7.0* 7.1*  --  7.0* 7.0*  --   HCT 26.4* 24.0* 23.7*  --  23.5* 24.2*  --   PLT 286 234 243  --   --  242  --   LABPROT 19.5*  --   --   --   --   --   --   INR 1.70*  --   --   --   --   --   --   CREATININE  --  1.06  --   --  1.06  --  1.04  TROPONINI <0.30 <0.30  --  <0.30  --   --   --     Estimated Creatinine Clearance: 77.1 ml/min (by C-G formula based on Cr of 1.04).   Medications:  Lovenox 135mg  SQ q12h  Assessment: 67yof on therapeutic Lovenox while Pradaxa (on PTA) is on hold. Cardiology has consulted pharmacy to transition patient to therapeutic heparin as patient may need PPM placement in near future. Patient received heparin earlier in April and had low therapeutic levels on 1500 units/hr - will begin with this rate approximately 9 hours after the last Lovenox injection per our protocol. Patient received last Lovenox 135mg  injection this AM ~1045. Will initially monitor patient with aPTT and heparin levels as Pradaxa can falsely elevate the heparin level. - Hg low at 7 but stable, Plts wnl - No significant bleeding reported  Goal of Therapy:  Heparin level 0.3-0.7 units/ml Monitor platelets by anticoagulation protocol: Yes   Plan:  1. Discontinue Lovenox 2. Start heparin drip 1500 units/hr (15 ml/hr) @ 2000 tonight  3. Check aptt and heparin level 6  hours after initiation 4. Daily heparin level, aptt and CBC   Patsey Berthold [facility] [phone] 03/25/2014,5:45 PM

## 2014-03-25 NOTE — Progress Notes (Signed)
03/25/2014- 1005- Resp care note-PT suctioned and extubated to 5lpm cannula.  Pt tolerated procedure well with pt vocalizing post extubation.  Sats 93-95% on 5lpm, rr20, hr83.  Will follow pt progress and wean to 4lpm as home o2 as pt tolerates.

## 2014-03-26 ENCOUNTER — Inpatient Hospital Stay (HOSPITAL_COMMUNITY): Payer: Medicare Other

## 2014-03-26 DIAGNOSIS — J9620 Acute and chronic respiratory failure, unspecified whether with hypoxia or hypercapnia: Secondary | ICD-10-CM

## 2014-03-26 LAB — CBC WITH DIFFERENTIAL/PLATELET
BASOS ABS: 0 10*3/uL (ref 0.0–0.1)
BASOS PCT: 0 % (ref 0–1)
EOS ABS: 0 10*3/uL (ref 0.0–0.7)
Eosinophils Relative: 0 % (ref 0–5)
HCT: 24.6 % — ABNORMAL LOW (ref 36.0–46.0)
Hemoglobin: 7.1 g/dL — ABNORMAL LOW (ref 12.0–15.0)
LYMPHS ABS: 0.4 10*3/uL — AB (ref 0.7–4.0)
Lymphocytes Relative: 4 % — ABNORMAL LOW (ref 12–46)
MCH: 25.5 pg — AB (ref 26.0–34.0)
MCHC: 28.9 g/dL — AB (ref 30.0–36.0)
MCV: 88.5 fL (ref 78.0–100.0)
Monocytes Absolute: 0.8 10*3/uL (ref 0.1–1.0)
Monocytes Relative: 8 % (ref 3–12)
NEUTROS PCT: 88 % — AB (ref 43–77)
Neutro Abs: 9.4 10*3/uL — ABNORMAL HIGH (ref 1.7–7.7)
PLATELETS: 233 10*3/uL (ref 150–400)
RBC: 2.78 MIL/uL — ABNORMAL LOW (ref 3.87–5.11)
RDW: 18.2 % — AB (ref 11.5–15.5)
WBC: 10.6 10*3/uL — ABNORMAL HIGH (ref 4.0–10.5)

## 2014-03-26 LAB — HEPARIN LEVEL (UNFRACTIONATED)
HEPARIN UNFRACTIONATED: 1.58 [IU]/mL — AB (ref 0.30–0.70)
HEPARIN UNFRACTIONATED: 1.36 [IU]/mL — AB (ref 0.30–0.70)
Heparin Unfractionated: 0.84 IU/mL — ABNORMAL HIGH (ref 0.30–0.70)

## 2014-03-26 LAB — CULTURE, RESPIRATORY W GRAM STAIN: Gram Stain: NONE SEEN

## 2014-03-26 LAB — MAGNESIUM
Magnesium: 2 mg/dL (ref 1.5–2.5)
Magnesium: 1.9 mg/dL (ref 1.5–2.5)

## 2014-03-26 LAB — BASIC METABOLIC PANEL
BUN: 31 mg/dL — ABNORMAL HIGH (ref 6–23)
BUN: 31 mg/dL — AB (ref 6–23)
CALCIUM: 8.4 mg/dL (ref 8.4–10.5)
CO2: 38 mEq/L — ABNORMAL HIGH (ref 19–32)
CO2: 37 meq/L — AB (ref 19–32)
Calcium: 8.5 mg/dL (ref 8.4–10.5)
Chloride: 92 mEq/L — ABNORMAL LOW (ref 96–112)
Chloride: 94 mEq/L — ABNORMAL LOW (ref 96–112)
Creatinine, Ser: 1.05 mg/dL (ref 0.50–1.10)
Creatinine, Ser: 1.23 mg/dL — ABNORMAL HIGH (ref 0.50–1.10)
GFR calc Af Amer: 62 mL/min — ABNORMAL LOW (ref >90)
GFR calc Af Amer: 51 mL/min — ABNORMAL LOW (ref >90)
GFR calc non Af Amer: 53 mL/min — ABNORMAL LOW (ref >90)
GFR calc non Af Amer: 44 mL/min — ABNORMAL LOW (ref >90)
Glucose, Bld: 180 mg/dL — ABNORMAL HIGH (ref 70–99)
Glucose, Bld: 275 mg/dL — ABNORMAL HIGH (ref 70–99)
Potassium: 3.7 mEq/L (ref 3.7–5.3)
Potassium: 3.8 mEq/L (ref 3.7–5.3)
SODIUM: 143 meq/L (ref 137–147)
Sodium: 141 mEq/L (ref 137–147)

## 2014-03-26 LAB — STREP PNEUMONIAE URINARY ANTIGEN: Strep Pneumo Urinary Antigen: NEGATIVE

## 2014-03-26 LAB — POCT I-STAT 3, ART BLOOD GAS (G3+)
Acid-Base Excess: 17 mmol/L — ABNORMAL HIGH (ref 0.0–2.0)
BICARBONATE: 42.7 meq/L — AB (ref 20.0–24.0)
O2 Saturation: 94 %
PCO2 ART: 55.8 mmHg — AB (ref 35.0–45.0)
TCO2: 44 mmol/L (ref 0–100)
pH, Arterial: 7.492 — ABNORMAL HIGH (ref 7.350–7.450)
pO2, Arterial: 67 mmHg — ABNORMAL LOW (ref 80.0–100.0)

## 2014-03-26 LAB — PROCALCITONIN: Procalcitonin: 0.87 ng/mL

## 2014-03-26 LAB — CULTURE, RESPIRATORY: CULTURE: NO GROWTH

## 2014-03-26 LAB — HEMOGLOBIN AND HEMATOCRIT, BLOOD
HCT: 26.6 % — ABNORMAL LOW (ref 36.0–46.0)
Hemoglobin: 7.5 g/dL — ABNORMAL LOW (ref 12.0–15.0)

## 2014-03-26 LAB — GLUCOSE, CAPILLARY
GLUCOSE-CAPILLARY: 168 mg/dL — AB (ref 70–99)
Glucose-Capillary: 161 mg/dL — ABNORMAL HIGH (ref 70–99)
Glucose-Capillary: 172 mg/dL — ABNORMAL HIGH (ref 70–99)
Glucose-Capillary: 204 mg/dL — ABNORMAL HIGH (ref 70–99)
Glucose-Capillary: 212 mg/dL — ABNORMAL HIGH (ref 70–99)
Glucose-Capillary: 271 mg/dL — ABNORMAL HIGH (ref 70–99)

## 2014-03-26 LAB — PRO B NATRIURETIC PEPTIDE: PRO B NATRI PEPTIDE: 6751 pg/mL — AB (ref 0–125)

## 2014-03-26 LAB — PHOSPHORUS
PHOSPHORUS: 3.5 mg/dL (ref 2.3–4.6)
Phosphorus: 3.1 mg/dL (ref 2.3–4.6)

## 2014-03-26 LAB — APTT: aPTT: 130 seconds — ABNORMAL HIGH (ref 24–37)

## 2014-03-26 MED ORDER — PREDNISONE 20 MG PO TABS
40.0000 mg | ORAL_TABLET | Freq: Every day | ORAL | Status: DC
  Administered 2014-03-27 – 2014-03-29 (×3): 40 mg via ORAL
  Filled 2014-03-26 (×4): qty 2

## 2014-03-26 MED ORDER — HEPARIN (PORCINE) IN NACL 100-0.45 UNIT/ML-% IJ SOLN
1200.0000 [IU]/h | INTRAMUSCULAR | Status: DC
  Administered 2014-03-26 (×2): 1200 [IU]/h via INTRAVENOUS
  Filled 2014-03-26 (×2): qty 250

## 2014-03-26 MED ORDER — INSULIN GLARGINE 100 UNIT/ML ~~LOC~~ SOLN
15.0000 [IU] | Freq: Two times a day (BID) | SUBCUTANEOUS | Status: DC
  Administered 2014-03-26 – 2014-04-03 (×16): 15 [IU] via SUBCUTANEOUS
  Filled 2014-03-26 (×18): qty 0.15

## 2014-03-26 MED ORDER — INSULIN ASPART 100 UNIT/ML ~~LOC~~ SOLN
0.0000 [IU] | Freq: Three times a day (TID) | SUBCUTANEOUS | Status: DC
  Administered 2014-03-26: 4 [IU] via SUBCUTANEOUS
  Administered 2014-03-27: 3 [IU] via SUBCUTANEOUS
  Administered 2014-03-27 (×2): 4 [IU] via SUBCUTANEOUS
  Administered 2014-03-28: 3 [IU] via SUBCUTANEOUS
  Administered 2014-03-28: 4 [IU] via SUBCUTANEOUS
  Administered 2014-03-29: 3 [IU] via SUBCUTANEOUS
  Administered 2014-03-29 – 2014-03-30 (×2): 4 [IU] via SUBCUTANEOUS
  Administered 2014-03-31: 11 [IU] via SUBCUTANEOUS
  Administered 2014-03-31: 3 [IU] via SUBCUTANEOUS
  Administered 2014-04-01 – 2014-04-02 (×3): 4 [IU] via SUBCUTANEOUS
  Administered 2014-04-03: 3 [IU] via SUBCUTANEOUS

## 2014-03-26 MED ORDER — LEVOFLOXACIN 750 MG PO TABS
750.0000 mg | ORAL_TABLET | Freq: Every day | ORAL | Status: AC
  Administered 2014-03-27 – 2014-04-02 (×7): 750 mg via ORAL
  Filled 2014-03-26 (×7): qty 1

## 2014-03-26 MED ORDER — IPRATROPIUM-ALBUTEROL 0.5-2.5 (3) MG/3ML IN SOLN
3.0000 mL | Freq: Four times a day (QID) | RESPIRATORY_TRACT | Status: DC
  Administered 2014-03-26 – 2014-04-03 (×28): 3 mL via RESPIRATORY_TRACT
  Filled 2014-03-26 (×31): qty 3

## 2014-03-26 MED ORDER — PANTOPRAZOLE SODIUM 40 MG PO TBEC
40.0000 mg | DELAYED_RELEASE_TABLET | Freq: Every day | ORAL | Status: DC
  Administered 2014-03-26 – 2014-03-28 (×3): 40 mg via ORAL
  Filled 2014-03-26 (×3): qty 1

## 2014-03-26 MED ORDER — HEPARIN (PORCINE) IN NACL 100-0.45 UNIT/ML-% IJ SOLN
1200.0000 [IU]/h | INTRAMUSCULAR | Status: DC
  Administered 2014-03-26: 600 [IU]/h via INTRAVENOUS
  Administered 2014-03-28: 1100 [IU]/h via INTRAVENOUS
  Administered 2014-03-28: 900 [IU]/h via INTRAVENOUS
  Administered 2014-03-29 – 2014-03-30 (×3): 1200 [IU]/h via INTRAVENOUS
  Filled 2014-03-26 (×6): qty 250

## 2014-03-26 NOTE — Evaluation (Addendum)
Physical Therapy Evaluation Patient Details Name: Paige Hardin MRN: [id number] DOB: 1946/11/26 Today's Date: 03/26/2014   History of Present Illness  68 year old woman with morbid obesity, severe COPD, lung nodules, DM-II, chronic diastolic HF (EF 60 % on 2/68/34), paroxysmal atrial fibrillation and s/p of cardioversion, who presents with worsening SOB and respiratory failure. Likely due to combinational for CHF and COPD exacerbation. Intubated in ED.  Pt extubated 4/29.  Clinical Impression  Pt admitted with above. Pt currently with functional limitations due to the deficits listed below (see PT Problem List).  Pt will benefit from skilled PT to increase their independence and safety with mobility to allow discharge to the venue listed below.       Follow Up Recommendations SNF    Equipment Recommendations  None recommended by PT    Recommendations for Other Services       Precautions / Restrictions Precautions Precautions: Fall      Mobility  Bed Mobility Overal bed mobility: Needs Assistance Bed Mobility: Supine to Sit;Sit to Supine     Supine to sit: Min assist;HOB elevated Sit to supine: Min assist;HOB elevated   General bed mobility comments: Assist to bring trunk up into sitting and to bring legs back up into bed.  Transfers Overall transfer level: Needs assistance Equipment used: Rolling walker (2 wheeled) Transfers: Sit to/from Stand Sit to Stand: Min assist         General transfer comment: Assist to bring hips up and lines.  Ambulation/Gait Ambulation/Gait assistance: Min assist Ambulation Distance (Feet): 2 Feet (sidestepping) Assistive device: Rolling walker (2 wheeled)       General Gait Details: Sidestepped up side of bed. Pt very worried about losing her IV and didn't want to get to chair.  Stairs            Wheelchair Mobility    Modified Rankin (Stroke Patients Only)       Balance           Standing balance support:  Bilateral upper extremity supported Standing balance-Leahy Scale: Poor Standing balance comment: Pt stood x 2 for 1-3 minutes with RW with min guard.                             Pertinent Vitals/Pain VSS    Home Living Family/patient expects to be discharged to:: Skilled nursing facility Living Arrangements: Alone     Home Access: Stairs to enter Entrance Stairs-Rails: Right Entrance Stairs-Number of Steps: 3 Home Layout: One level;Laundry or work area in [place]: Kasandra Knudsen - single point Additional Comments: Pt has been at [facility] for a few month.    Prior Function Level of Independence: Needs assistance   Gait / Transfers Assistance Needed: Amb short distances with walker and supervision.           Hand Dominance   Dominant Hand: Right    Extremity/Trunk Assessment   Upper Extremity Assessment: Generalized weakness           Lower Extremity Assessment: Generalized weakness         Communication   Communication: No difficulties  Cognition Arousal/Alertness: Awake/alert Behavior During Therapy: WFL for tasks assessed/performed Overall Cognitive Status: Within Functional Limits for tasks assessed                      General Comments      Exercises        Assessment/Plan  PT Assessment Patient needs continued PT services  PT Diagnosis Difficulty walking;Generalized weakness   PT Problem List Decreased strength;Decreased activity tolerance;Decreased balance;Decreased mobility;Obesity  PT Treatment Interventions DME instruction;Gait training;Functional mobility training;Therapeutic activities;Therapeutic exercise;Balance training;Patient/family education   PT Goals (Current goals can be found in the Care Plan section) Acute Rehab PT Goals Patient Stated Goal: None stated. Agreeable to work on incr mobility PT Goal Formulation: With patient Time For Goal Achievement: 04/02/14 Potential to Achieve Goals: Good     Frequency Min 2X/week   Barriers to discharge Decreased caregiver support      Co-evaluation               End of Session Equipment Utilized During Treatment: Oxygen Activity Tolerance: Patient tolerated treatment well Patient left: in bed;with call bell/phone within reach Nurse Communication: Mobility status         Time: 1142-1208 PT Time Calculation (min): 26 min   Charges:   PT Evaluation $Initial PT Evaluation Tier I: 1 Procedure PT Treatments $Gait Training: 8-22 mins   PT G CodesShary Decamp Kamiah Fite 03/26/2014, 12:48 PM  [employer] PT [phone]

## 2014-03-26 NOTE — Progress Notes (Signed)
1830 report received from [place] Transferred in from  2W via wheelchair . Ambulated from wheelchair to bed with 1 person assist . With amiodarone in progress at 16.7 cc/hr . Pt fully awake alert and oriented . Answered all queries appropriatelyeasy and regular breathing with o2

## 2014-03-26 NOTE — Progress Notes (Signed)
ANTICOAGULATION CONSULT NOTE - Follow Up Consult  Pharmacy Consult for Lovenox>>>Heparin  Indication: atrial fibrillation  Allergies  Allergen Reactions  . Ace Inhibitors Cough  . Clindamycin Rash    Patient Measurements: Height: 5\' 9"  (175.3 cm) Weight: 294 lb 12.1 oz (133.7 kg) IBW/kg (Calculated) : 66.2  Vital Signs: Temp: 97.9 F (36.6 C) (04/30 1208) Temp src: Oral (04/30 1208) BP: 131/83 mmHg (04/30 1400) Pulse Rate: 118 (04/30 1400)  Labs:  Recent Labs  03/23/14 2133 03/24/14 0747 03/24/14 1350 03/24/14 1400  03/25/14 0315 03/25/14 0846 03/25/14 1949 03/26/14 0348 03/26/14 1410  HGB 7.7* 7.0* 7.1*  --   < > 7.0*  --  7.3* 7.1*  --   HCT 26.4* 24.0* 23.7*  --   < > 24.2*  --  25.1* 24.6*  --   PLT 286 234 243  --   --  242  --   --  233  --   APTT  --   --   --   --   --   --   --   --  130*  --   LABPROT 19.5*  --   --   --   --   --   --   --   --   --   INR 1.70*  --   --   --   --   --   --   --   --   --   HEPARINUNFRC  --   --   --   --   --   --   --   --  1.58* 1.36*  CREATININE  --  1.06  --   --   < >  --  1.04 1.01 1.05  --   TROPONINI <0.30 <0.30  --  <0.30  --   --   --   --   --   --   < > = values in this interval not displayed.  Estimated Creatinine Clearance: 75.4 ml/min (by C-G formula based on Cr of 1.05).   Assessment: 68 y/o F who was recently transitioned to heparin from Lovenox (last dose 9 hrs prior to starting IV heparin). Heparin level was high and drip was held then reduced this AM. Noted to be on Pradaxa PTA, but Pradaxa doesn't normally have an effect on the heparin level. Heparin level was drawn correctly. aPTT was also prolonged at that time. Hemoglobin has been low at 7.1 but stable.    Heparin level remains high this PM despite holding x1 hour and decreasing the drip rate.   Goal of Therapy:  Heparin level 0.3-0.7 units/ml Monitor platelets by anticoagulation protocol: Yes APTT goal 6-102   Plan:  -Again, RN  instructed to hold heparin x2 hour -Recheck Heparin level in 2 Hour to confirm heparin level has decreased before restarting.  -Daily CBC/HL -Monitor for bleeding  Sloan Leiter, PharmD, BCPS Clinical Pharmacist [phone] 03/26/2014,3:10 PM

## 2014-03-26 NOTE — Progress Notes (Signed)
ANTICOAGULATION CONSULT NOTE - Follow Up Consult  Pharmacy Consult for Lovenox>>>Heparin  Indication: atrial fibrillation  Allergies  Allergen Reactions  . Ace Inhibitors Cough  . Clindamycin Rash    Patient Measurements: Height: 5\' 9"  (175.3 cm) Weight: 300 lb 11.3 oz (136.4 kg) IBW/kg (Calculated) : 66.2  Vital Signs: Temp: 98.4 F (36.9 C) (04/30 0347) Temp src: Oral (04/30 0347) BP: 126/66 mmHg (04/30 0400) Pulse Rate: 77 (04/30 0400)  Labs:  Recent Labs  03/23/14 2133 03/24/14 0747 03/24/14 1350 03/24/14 1400  03/25/14 0315 03/25/14 0846 03/25/14 1949 03/26/14 0348  HGB 7.7* 7.0* 7.1*  --   < > 7.0*  --  7.3* 7.1*  HCT 26.4* 24.0* 23.7*  --   < > 24.2*  --  25.1* 24.6*  PLT 286 234 243  --   --  242  --   --  233  APTT  --   --   --   --   --   --   --   --  130*  LABPROT 19.5*  --   --   --   --   --   --   --   --   INR 1.70*  --   --   --   --   --   --   --   --   HEPARINUNFRC  --   --   --   --   --   --   --   --  1.58*  CREATININE  --  1.06  --   --   < >  --  1.04 1.01 1.05  TROPONINI <0.30 <0.30  --  <0.30  --   --   --   --   --   < > = values in this interval not displayed.  Estimated Creatinine Clearance: 76.3 ml/min (by C-G formula based on Cr of 1.05).   Assessment: 68 y/o F who transitioned to heparin from Lovenox. HL is high at 1.58, and aPTT is elevated as well at 130. Lab drawn from opposite arm of heparin infusion, last Lovenox dose was ~19 hours ago. Noted to be on Pradaxa PTA, but Pradaxa doesn't normally have an effect on the heparin level.   Goal of Therapy:  Heparin level 0.3-0.7 units/ml Monitor platelets by anticoagulation protocol: Yes   Plan:  -Hold heparin x 1 hour -Re-start heparin at 1200 units/hr -1400 HL -Daily CBC/HL -Monitor for bleeding  Narda Bonds 03/26/2014,5:17 AM

## 2014-03-26 NOTE — Progress Notes (Signed)
Inpatient Diabetes Program Recommendations  AACE/ADA: New Consensus Statement on Inpatient Glycemic Control (2013)  Target Ranges:  Prepandial:   less than 140 mg/dL      Peak postprandial:   less than 180 mg/dL (1-2 hours)      Critically ill patients:  140 - 180 mg/dL   Reason for Assessment:  Results for Paige Hardin, Paige Hardin (MRN [id number]) as of 03/26/2014 09:18  Ref. Range 03/25/2014 08:04 03/25/2014 11:52 03/25/2014 15:35 03/25/2014 19:29 03/25/2014 23:46  Glucose-Capillary Latest Range: 70-99 mg/dL 248 (H) 276 (H) 275 (H) 245 (H) 212 (H)   Diabetes history:  Type 2 diabetes on steroids- Note that A1C was good in March, 2015 Outpatient Diabetes medications: Januvia 100 mg daily Current orders for Inpatient glycemic control: Lantus 10 units bid, Novolog 3 untis q 4 hours- tube feed coverage, Novolog resistant q 4 hours  Note that it appears tube feeds were stopped 4/29.  Please discontinue Novolog tube feed coverage 3 units q 4 hours.  Also consider increasing Lantus to 15 units bid and add Novolog 5 units tid with meals.  As steroids are decreased, insulin will also need to be decreased.     Adah Perl, RN, BC-ADM Inpatient Diabetes Coordinator Pager [phone]

## 2014-03-26 NOTE — Progress Notes (Addendum)
PULMONARY / CRITICAL CARE MEDICINE   Name: Paige Hardin MRN: [id number] DOB: May 13, 1946    ADMISSION DATE:  03/23/2014 CONSULTATION DATE:  03/23/2014  REFERRING MD : ED  PRIMARY SERVICE: PCCM  CHIEF COMPLAINT: SOB  BRIEF PATIENT DESCRIPTION:  The patient is a 68 year old woman with morbid obesity, severe COPD, lung nodules, DM-II, chronic diastolic HF (EF 60 % on 05/23/02), paroxysmal atrial fibrillation and s/p of cardioversion, who presents with worsening SOB and respiratory failure. Likely due to combinational for CHF and COPD exacerbation. Intubated in ED.   SIGNIFICANT EVENTS / STUDIES:  ETT 4/27>>> 4/29  LINES / TUBES:  PIV 4/27>>>  CULTURES:  Blood culture 4/27>>> Sputum culture 4/27>>>  Tracheal aspirate 4/27>>  Resp virus tracheal aspirate 4/27 >>> neg Urine culture 4/27 >>> neg Urine legionella 4/30 >>> Urine strep 4/30 >>>  ANTIBIOTICS:  Vancomycin 4/27>>> 4/30 Cefpime 4/27>>> 4/30 levaquin 4/30 >>   INTERVAL HISTORY:   SpO2 mid 90s on 5L.  Amiodarone infusion started yesterday for Afib.  HR better controlled, rhythm still irregular.  EP to see today to evaluate for AV node ablation/PPM.  Patient denies CP or palpitations.  Good UOP.  6.6L net neg since admission.  VITAL SIGNS:   Temp:  [98.2 F (36.8 C)-98.6 F (37 C)] 98.4 F (36.9 C) (04/30 0347) Pulse Rate:  [66-134] 108 (04/30 0600) Resp:  [13-27] 15 (04/30 0600) BP: (85-137)/(38-66) 125/65 mmHg (04/30 0600) SpO2:  [89 %-100 %] 95 % (04/30 0600) FiO2 (%):  [40 %] 40 % (04/29 0822)  HEMODYNAMICS:   VENTILATOR SETTINGS:  N/A  INTAKE / OUTPUT: Intake/Output     04/29 0701 - 04/30 0700   P.O. 350   I.V. (mL/kg) 614 (4.5)   NG/GT 40   IV Piggyback 750   Total Intake(mL/kg) 1754 (12.9)   Urine (mL/kg/hr) 4225 (1.3)   Total Output 4225   Net -2471        PHYSICAL EXAMINATION: General:  Sitting up in bed in NAD, watching tv Neuro:  AAO, responding appropriately HEENT:  WNL Cardiovascular:   Irregularly irregular Lungs: decreased BS B/L Abdomen:  +BS, soft, NT, ND Musculoskeletal:  Able to move extremities voluntarily Extremities:  1+ B/L lower extremity edema Skin:  Intact   LABS:  CBC  Recent Labs Lab 03/24/14 1350  03/25/14 0315 03/25/14 1949 03/26/14 0348  WBC 11.7*  --  11.8*  --  10.6*  HGB 7.1*  < > 7.0* 7.3* 7.1*  HCT 23.7*  < > 24.2* 25.1* 24.6*  PLT 243  --  242  --  233  < > = values in this interval not displayed. Coag's  Recent Labs Lab 03/23/14 2133 03/26/14 0348  APTT  --  130*  INR 1.70*  --    BMET  Recent Labs Lab 03/25/14 0846 03/25/14 1949 03/26/14 0348  NA 139 143 141  K 3.8 3.3* 3.7  CL 93* 92* 92*  CO2 35* 38* 38*  BUN 29* 30* 31*  CREATININE 1.04 1.01 1.05  GLUCOSE 242* 248* 180*   Electrolytes  Recent Labs Lab 03/25/14 0846 03/25/14 1949 03/26/14 0348  CALCIUM 8.6 8.7 8.5  MG 2.1 2.0 2.0  PHOS 3.5 2.9 3.1   Sepsis Markers  Recent Labs Lab 03/23/14 2133 03/26/14 0348  LATICACIDVEN 0.9  --   PROCALCITON 0.40 0.87   ABG  Recent Labs Lab 03/25/14 0409 03/25/14 0613 03/26/14 0242  PHART 7.518* 7.443 7.492*  PCO2ART 48.1* 57.0* 55.8*  PO2ART 68.0*  79.0* 67.0*   Liver Enzymes  Recent Labs Lab 03/23/14 1715  AST 26  ALT 24  ALKPHOS 93  BILITOT 0.9  ALBUMIN 3.3*   Cardiac Enzymes  Recent Labs Lab 03/23/14 1715 03/23/14 2133 03/24/14 0747 03/24/14 1400 03/26/14 0348  TROPONINI  --  <0.30 <0.30 <0.30  --   PROBNP 2475.0*  --   --   --  6751.0*   Glucose  Recent Labs Lab 03/25/14 0406 03/25/14 0804 03/25/14 1152 03/25/14 1535 03/25/14 1929 03/25/14 2346  GLUCAP 189* 248* 276* 275* 245* 212*    Imaging Dg Chest Port 1 View  03/26/2014   CLINICAL DATA:  Respiratory failure  EXAM: PORTABLE CHEST - 1 VIEW  COMPARISON:  DG CHEST 1V PORT dated 03/25/2014; DG CHEST 1V PORT dated 03/24/2014; DG CHEST 1V PORT dated 03/23/2014  FINDINGS: Grossly unchanged enlarged cardiac silhouette and  mediastinal contours with atherosclerotic plaque within the thoracic aorta. Interval extubation and removal of enteric tube. No pneumothorax. The pulmonary vascular is less distinct on present examination with cephalization of flow. Grossly unchanged perihilar and bilateral medial basilar heterogeneous/consolidative opacities, left greater than right. Trace pleural effusions are not excluded. Unchanged bones.  IMPRESSION: 1. Interval extubation removal of enteric tube.  No pneumothorax. 2. Interval development / worsening of pulmonary edema and perihilar/bibasilar opacities, left greater than right, likely atelectasis.   Electronically Signed   By: Sandi Mariscal M.D.   On: 03/26/2014 07:53   Dg Chest Port 1 View  03/25/2014   CLINICAL DATA:  Respiratory failure.  EXAM: PORTABLE CHEST - 1 VIEW  COMPARISON:  DG CHEST 1V PORT dated 03/24/2014  FINDINGS: Endotracheal tube and NG tube in stable position. Persistent severe cardiomegaly pulmonary venous congestion and bilateral diffuse pulmonary alveolar infiltrates present. Bilateral pleural effusions are most likely present. No significant interim change from prior exam. Findings consistent with congestive heart failure with pulmonary edema. No pneumothorax. No acute osseous abnormality .  IMPRESSION: 1. Stable line and tube positions. 2. Persistent congestive heart failure with prominent bilateral pulmonary alveolar edema and small bilateral pleural effusions. No interim improvement.   Electronically Signed   By: Marcello Moores  Register   On: 03/25/2014 07:24   CXR: 04/30 increased B/L opacities  ASSESSMENT / PLAN:  PULMONARY  A:  - Acute hypercapnic respiratory failure 2/2 to acute exacerbation of dHF (EF 60% TTE 02/15) with AECOPD likely contributing; extubated 04/29 - PAH, 53 mmHg  P:  - Lasix 80mg  BID  - Albuterol neb q2h prn - Duoneb qid - IV Solu-medrol 60mg  q8h > change to pred 40mg  on 4/30 and then taper - probably needs an outpt sleep study, sees Dr  Gwenette Greet    CARDIOVASCULAR  A:  - Hx of PAF (s/p of cardioversion), afib since 04/29 AM - cardiology evaluated the patient and switched po amio to gtt.  Plan for EP to evaluate today for possible AV node ablation and PPM.  Initially RVR but HR controlled this AM;  - Acute on chronic dHF--> clinically volume overloaded at admission, proBNP 2475 --> 6751.0; net -6.6L; wt even P:  - EP to see on 04/30 and evaluate for possible AV node ablation/PPM  - Continue home medications for Afib including amiodarone (gtt), metoprolol, and Cardizem > note prolonged QTc - Aggressive diuresis with Lasix  - Follow up BMP/Mg/Phos BID, replete electrolytes as needed  - Holding home Pradaxa; Lovenox --> heparin gtt (in case anticoag needs to be halted for procedure) - VTE ppx - SCDs and heparin  gtt   RENAL  A:  - CKD3, Cr stable - Hyperkalemia, resolved P:  - Diuresis as above, adjust based on renal status - Monitor BMP    GASTROINTESTINAL  A:  - GERD  P:  - IV Pepcid > convert to PO PPI, she was on prevacid at home - Clear liquids > don't advance until EP plans are solidified   HEMATOLOGIC  A:  - Leukocytosis, resolved WBC 17.3 -->10.6, no clear source of infection - Chronic anemia, baseline 7-9; this admission 8.2-->7.7-->7.1, no gross blood loss noted P:  - Monitor for s/s of infection or bleeding - Monitor CBC     INFECTIOUS  A:  - Leukocytosis, productive cough with Sami sputum, no fever or chills; lactic acid 0.9 reassuring, PCT 0.4-->0.87 P:  - will simplify abx > d/c vanco 4/30, convert to levaquin PO - Pan-culture pending   ENDOCRINE  A:  - DM type 2, CBGs > 200, steroid likely contributing P:  - SSI resistant q4h, Lantus 10 units BID --> 15 units BID; CBG goal 140 to 180  - Hold PO DM regimen for now until we assess ability to take adequate PO - Monitor CBG   NEUROLOGIC  A:  - Acute hypoxic encephalopathy 2/2 to acute respiratory failure, resolved  P:  - monitor for  change in neuro status   Duwaine Maxin, DO IMTS, PGY1 03/26/2014, 6:42 AM  Will transfer to telemetry 4/30, PCCM service for now pending Cardiology's plans   Baltazar Apo, MD, PhD 03/26/2014, 10:03 AM [facility] [phone] or if no answer [phone]

## 2014-03-26 NOTE — Consult Note (Addendum)
ELECTROPHYSIOLOGY CONSULT NOTE   Patient ID: Paige Hardin MRN: [id number], DOB/AGE: 1946-03-11   Admit date: 03/23/2014 Date of Consult: 03/26/2014  Primary Physician: Tollie Pizza, MD Primary Cardiologist: Aundra Dubin, MD Primary EP: Lovena Le, MD Reason for Consultation: Atrial fibrillation  History of Present Illness Paige Hardin is a 68 y.o. female with morbid obesity, severe COPD, lung nodules, DM, chronic diastolic HF and paroxysmal atrial fibrillation. Over the last 3 months, she has had multiple admissions with acute onset respiratory failure due to a combination of diastolic heart failure and COPD, all exacerbated by atrial fibrillation with a rapid ventricular response. She has been on antiarrhythmic drug therapy with flecainide but failed to maintain SR. Previously she was not on amiodarone due to chronic lung disease. However, her ventricular rates and atrial fibrillation have been very difficult to control despite multiple medications. Most recently admitted 02/24/2014 - 03/12/2014 and was started on amiodarone. After 4 days of amiodarone loading, she underwent DCCV on 03/11/2014 which was successful for restoring SR. She tolerated this procedure well without complication. She was discharged on 03/12/2014 in SR to follow-up with Dr. Aundra Dubin on 03/23/2014. On 03/23/2014, she presented to the office hypoxic and tachypneic in acute respiratory failure and was admitted to [facility]. Initially on BiPAP then intubated. Of note, she was in SR on admission. Yesterday she was extubated. She went into rapid atrial fibrillation and Cardiology was called to help with rhythm management. EP has been asked to see for recommendations regarding other treatment options. Paige Hardin reports feeling "jittery inside" but otherwise denies any other symptoms of AFib. She denies CP or palpitations. She noticed worsening DOE 2-3 days prior to admission. She denies syncope.   Past Medical History Past Medical History  Diagnosis Date    . Chronic low back pain   . Emphysema   . Paroxysmal atrial fibrillation   . COPD (chronic obstructive pulmonary disease)   . Hyperlipidemia   . Diastolic congestive heart failure   . Obese   . Carotid atherosclerosis   . Lung nodules   . Hoarseness, chronic   . Pneumonia 1990's    "once"  . Type 2 diabetes mellitus   . GERD (gastroesophageal reflux disease)   . Arthritis     "joints" (01/14/2014)  . Depression     "maybe" (01/14/2014    Past Surgical History Past Surgical History  Procedure Laterality Date  . Esophagogastroduodenoscopy  01/26/2012    Procedure: ESOPHAGOGASTRODUODENOSCOPY (EGD);  Surgeon: Beryle Beams, MD;  Location: [facility];  Service: Endoscopy;  Laterality: N/A;  . Colonoscopy  01/26/2012    Procedure: COLONOSCOPY;  Surgeon: Beryle Beams, MD;  Location: [facility];  Service: Endoscopy;  Laterality: N/A;  . Breast biopsy Left ~ 1980    "solid"  . Breast lumpectomy Left ~ 1980    "removed a mass; it was benign" (01/14/2014)  . Total abdominal hysterectomy  1994  . Cardioversion N/A 03/11/2014    Procedure: CARDIOVERSION;  Surgeon: Sanda Klein, MD;  Location: [facility] OR;  Service: Cardiovascular;  Laterality: N/A;    Allergies/Intolerances Allergies  Allergen Reactions  . Ace Inhibitors Cough  . Clindamycin Rash    Current Home Medications      acetaminophen 325 MG tablet  Commonly known as:  TYLENOL  Take 325 mg by mouth daily as needed for mild pain.     amiodarone 200 MG tablet  Commonly known as:  PACERONE  Take 2 tabs (400 mg) twice daily for 7 days, then take  2 tabs (400 mg) once daily for 14 days then take 1 tab (200 mg) once daily thereafter     ATIVAN 0.5 MG tablet  Generic drug:  LORazepam  Take 0.25 mg by mouth every 6 (six) hours as needed for anxiety.     bisacodyl 10 MG suppository  Commonly known as:  DULCOLAX  Place 1 suppository (10 mg total) rectally daily as needed for moderate constipation.     budesonide-formoterol  80-4.5 MCG/ACT inhaler  Commonly known as:  SYMBICORT  Inhale 2 puffs into the lungs 2 (two) times daily.     Calcium 333-80-133-133 Tabs  Take 1 tablet by mouth daily.     calcium-vitamin D 500-200 MG-UNIT per tablet  Commonly known as:  OSCAL WITH D  Take 1 tablet by mouth daily with breakfast.     dabigatran 150 MG Caps capsule  Commonly known as:  PRADAXA  Take 1 capsule (150 mg total) by mouth 2 (two) times daily.     diltiazem 180 MG 24 hr capsule  Commonly known as:  CARDIZEM CD  Take 1 capsule (180 mg total) by mouth daily.     ezetimibe-simvastatin 10-40 MG per tablet  Commonly known as:  VYTORIN  Take 1 tablet by mouth at bedtime.     feeding supplement (PRO-STAT SUGAR FREE 64) Liqd  Take 30 mLs by mouth 2 (two) times daily.     furosemide 40 MG tablet  Commonly known as:  LASIX  Take 40-80 mg by mouth See admin instructions. Take 40mg  evey other day alternating with 80mg  every other day     glyBURIDE micronized 3 MG tablet  Commonly known as:  GLYNASE  Take 3 mg by mouth daily.     guaiFENesin-codeine 100-10 MG/5ML syrup  Take 5 mLs by mouth every 6 (six) hours as needed for cough.     lansoprazole 30 MG capsule  Commonly known as:  PREVACID  Take 30 mg by mouth daily at 12 noon.     levalbuterol 0.63 MG/3ML nebulizer solution  Commonly known as:  XOPENEX  Take 3 mLs (0.63 mg total) by nebulization every 6 (six) hours as needed for wheezing or shortness of breath.     levalbuterol 45 MCG/ACT inhaler  Commonly known as:  XOPENEX HFA  Inhale 2 puffs into the lungs every 6 (six) hours as needed for wheezing.     Magnesium-Zinc 133.33-5 MG Tabs  Take 133 mg by mouth daily.     metoprolol 50 MG tablet  Commonly known as:  LOPRESSOR  Take 1 tablet (50 mg total) by mouth 2 (two) times daily.     MULTIVITAMIN/IRON PO  Take 1 tablet by mouth daily.     omeprazole 20 MG capsule  Commonly known as:  PRILOSEC  Take 20 mg by mouth daily.     potassium  chloride SA 20 MEQ tablet  Commonly known as:  K-DUR,KLOR-CON  Take 20 mEq by mouth daily.     sitaGLIPtin 100 MG tablet  Commonly known as:  JANUVIA  Take 100 mg by mouth daily.     sodium chloride 0.65 % Soln nasal spray  Commonly known as:  OCEAN  Place 1 spray into both nostrils as needed for congestion.     tiotropium 18 MCG inhalation capsule  Commonly known as:  SPIRIVA  Place 18 mcg into inhaler and inhale daily.     vitamin B-12 500 MCG tablet  Commonly known as:  CYANOCOBALAMIN  Take 500 mcg by  mouth daily.     Vitamin C 500 MG Caps  Take 500 mg by mouth daily.     Vitamin D 2000 UNITS tablet  Take 2,000 Units by mouth daily.     Inpatient Medications . antiseptic oral rinse  15 mL Mouth Rinse q12n4p  . ezetimibe  10 mg Oral QHS   And  . atorvastatin  20 mg Oral QHS  . calcium carbonate (dosed in mg elemental calcium)  500 mg of elemental calcium Oral Q breakfast  . ceFEPime (MAXIPIME) IV  1 g Intravenous 3 times per day  . chlorhexidine  15 mL Mouth Rinse BID  . cholecalciferol  2,000 Units Oral Daily  . diltiazem  60 mg Oral 3 times per day  . famotidine (PEPCID) IV  20 mg Intravenous Q12H  . feeding supplement (PRO-STAT SUGAR FREE 64)  30 mL Per Tube QID  . feeding supplement (VITAL HIGH PROTEIN)  1,000 mL Per Tube Q24H  . furosemide  80 mg Intravenous BID  . insulin aspart  0-20 Units Subcutaneous 6 times per day  . insulin aspart  3 Units Subcutaneous Q4H  . insulin glargine  10 Units Subcutaneous BID  . ipratropium-albuterol  3 mL Nebulization Q6H  . methylPREDNISolone (SOLU-MEDROL) injection  60 mg Intravenous 3 times per day  . metoprolol  50 mg Oral BID  . potassium chloride  40 mEq Oral Daily  . sodium chloride  3 mL Intravenous Q12H  . vancomycin  1,250 mg Intravenous Q12H  . vitamin B-12  500 mcg Oral Daily   . amiodarone (NEXTERONE PREMIX) 360 mg/200 mL dextrose 30 mg/hr (03/26/14 0204)  . heparin 1,200 Units/hr (03/26/14 3244)    Family  History Family History  Problem Relation Age of Onset  . Breast cancer Sister   . Heart disease    . Hodgkin's lymphoma    . Asthma Mother      Social History History   Social History  . Marital Status: Legally Separated    Spouse Name: N/A    Number of Children: N/A  . Years of Education: N/A   Occupational History  . retired from [employer]    Social History Main Topics  . Smoking status: Former Smoker -- 3.00 packs/day for 45 years    Types: Cigarettes    Quit date: 12/28/2008  . Smokeless tobacco: Never Used  . Alcohol Use: Yes     Comment: 01/14/2014 "used to drink socially in the past; last drink was probably 10 yr ago"  . Drug Use: No  . Sexual Activity: Not Currently   Other Topics Concern  . Not on file   Social History Narrative  . No narrative on file     Review of Systems General: No chills, fever, night sweats or weight changes  Cardiovascular:  No chest pain, dyspnea on exertion, edema, orthopnea, palpitations, paroxysmal nocturnal dyspnea Dermatological: No rash, lesions or masses Respiratory: No cough, dyspnea Urologic: No hematuria, dysuria Abdominal: No nausea, vomiting, diarrhea, bright red blood per rectum, melena, or hematemesis Neurologic: No visual changes, weakness, changes in mental status All other systems reviewed and are otherwise negative except as noted above.  Physical Exam Vitals: Blood pressure 125/65, pulse 108, temperature 98.4 F (36.9 C), temperature source Oral, resp. rate 15, height 5\' 9"  (1.753 m), weight 294 lb 12.1 oz (133.7 kg), SpO2 95.00%.  General: Well developed, obese 68 y.o. female in no acute distress. HEENT: Normocephalic, atraumatic. EOMs intact. Sclera nonicteric. Oropharynx clear.  Neck:  Supple. No JVD. Lungs: Respirations regular and unlabored. Diminished breath sounds bilaterally with few end-expiratory wheezes on right. No rhonchi. Heart: Irregular, tachcardic. S1, S2 present. No murmurs, rub, S3 or  S4. Abdomen: Large but soft, non-distended.  Extremities: No clubbing, cyanosis or edema. DP/PT/Radials 2+ and equal bilaterally. Psych: Normal affect. Neuro: Alert and oriented X 3. Moves all extremities spontaneously. Musculoskeletal: No kyphosis. Skin: Intact. Warm and dry. No rashes or petechiae in exposed areas.   Labs  Recent Labs  03/23/14 2133 03/24/14 0747 03/24/14 1400  TROPONINI <0.30 <0.30 <0.30   Lab Results  Component Value Date   WBC 10.6* 03/26/2014   HGB 7.1* 03/26/2014   HCT 24.6* 03/26/2014   MCV 88.5 03/26/2014   PLT 233 03/26/2014    Recent Labs Lab 03/23/14 1715  03/26/14 0348  NA 130*  < > 141  K 5.4*  < > 3.7  CL 90*  < > 92*  CO2 27  < > 38*  BUN 21  < > 31*  CREATININE 1.15*  < > 1.05  CALCIUM 9.0  < > 8.5  PROT 7.2  --   --   BILITOT 0.9  --   --   ALKPHOS 93  --   --   ALT 24  --   --   AST 26  --   --   GLUCOSE 297*  < > 180*  < > = values in this interval not displayed.  Recent Labs  03/23/14 2133  INR 1.70*    Radiology/Studies Dg Chest Port 1 View  03/25/2014   CLINICAL DATA:  Respiratory failure.  EXAM: PORTABLE CHEST - 1 VIEW  COMPARISON:  DG CHEST 1V PORT dated 03/24/2014  FINDINGS: Endotracheal tube and NG tube in stable position. Persistent severe cardiomegaly pulmonary venous congestion and bilateral diffuse pulmonary alveolar infiltrates present. Bilateral pleural effusions are most likely present. No significant interim change from prior exam. Findings consistent with congestive heart failure with pulmonary edema. No pneumothorax. No acute osseous abnormality .  IMPRESSION: 1. Stable line and tube positions. 2. Persistent congestive heart failure with prominent bilateral pulmonary alveolar edema and small bilateral pleural effusions. No interim improvement.   Electronically Signed   By: Marcello Moores  Register   On: 03/25/2014 07:24   Dg Chest Port 1 View  03/24/2014   CLINICAL DATA:  Acute respiratory failure.  EXAM: PORTABLE CHEST -  1 VIEW  COMPARISON:  03/23/2014 and 03/06/2014  FINDINGS: Endotracheal tube is in good position 3.6 cm above the carina. NG tube tip is below the diaphragm.  There is chronic cardiomegaly. Pulmonary vascularity is less prominent and the diffuse interstitial and alveolar accentuation has diminished suggesting that this represents pulmonary edema. There small bilateral pleural effusions.  IMPRESSION: Improved interstitial and alveolar infiltrates. Small effusions. Findings may represent pulmonary edema.   Electronically Signed   By: Rozetta Nunnery M.D.   On: 03/24/2014 08:06   Dg Chest Portable 1 View  03/23/2014   CLINICAL DATA:  ET tube placement  EXAM: PORTABLE CHEST - 1 VIEW  COMPARISON:  DG CHEST 1V PORT dated 03/23/2014  FINDINGS: There is an endotracheal tube with the tip 2.5 cm above the carina. There are bilateral interstitial and alveolar airspace opacities. There are bilateral small pleural effusions. There is no pneumothorax. There is stable cardiomegaly. There is aortic atherosclerosis.  IMPRESSION: 1. Endotracheal tube with the tip 2.5 cm above the carina. 2. Bilateral interstitial and alveolar airspace opacities which may be secondary to multilobar  pneumonia versus pulmonary edema.   Electronically Signed   By: Kathreen Devoid   On: 03/23/2014 18:26   Dg Chest Port 1 View  03/23/2014   CLINICAL DATA:  Short of breath  EXAM: PORTABLE CHEST - 1 VIEW  COMPARISON:  DG CHEST 2 VIEW dated 03/06/2014  FINDINGS: Cardiomegaly. Diffuse edema. Vascular congestion. Increased opacity at the lung bases most consistent with airspace edema. No pneumothorax. No definite pleural effusion.  IMPRESSION: CHF with interstitial edema and some basilar airspace edema.   Electronically Signed   By: Maryclare Bean M.D.   On: 03/23/2014 17:35    Echocardiogram Feb 2015 Study Conclusions - Left ventricle: The cavity size was mildly dilated. Wall thickness was increased in a pattern of mild LVH. The estimated ejection fraction was  60%. Wall motion was normal; there were no regional wall motion abnormalities. Findings consistent with left ventricular diastolic dysfunction. Doppler parameters are consistent with high ventricular filling pressure. - Aortic valve: Sclerosis without stenosis. No significant regurgitation. - Mitral valve: Mildly calcified annulus. Mildly thickened leaflets . Mild regurgitation. - Left atrium: The atrium was mildly to moderately dilated. - Right atrium: The atrium was mildly dilated. - Pulmonary arteries: PA peak pressure: 67mm Hg (S). - Inferior vena cava: The vessel was dilated; the respirophasic diameter changes were blunted (< 50%); findings are consistent with elevated central venous pressure.  12-lead ECG on admission - sinus rhythm at 86 bpm, incomplete RBBB 12-lead ECG yesterday - atrial fibrillation with V rate 116 bpm Telemetry - atrial fibrillation with V rate 96 - 118 bpm  Assessment and Plan Acute on chronic respiratory failure  Severe COPD with acute exacerbation  Acute on chronic diastolic heart failure  Atrial fibrillation with RVR  Chronic anemia  Diabetes mellitus  Morbid obesity  Chronic kidney disease, stage III  Paige Hardin has recurrent atrial fibrillation with RVR. She reverted to AFib after extubation yesterday. However, on admission 03/23/2014, she was in [place] which argues against AFib w/RVR as cause for acute on chronic respiratory failure. Today she feels "jittery inside" but is not in respiratory distress or hypoxic. She has history of V rates that are difficult to control despite rate controlling medications which prompted discussion during previous admissions regarding the possibility of PPM implant +AV node ablation. This may be required going forward only if her rate is not adequately controlled on medical therapy to prevent tachy-mediated cardiomyopathy.    Signed, Andrez Grime, PA-C 03/26/2014, 7:16 AM   EP Attending  Patient seen and  examined. I have minimally revised the note above. I agree with his history, physical exam, assessment and plan. The patient has in summary a long history of atrial fibrillation, obesity, hypertension, and diastolic heart failure. She was in the hospital over 2 weeks approximately one month ago. Ultimately, she was begun on amiodarone.  I initially did not think this would help her but she cardioverted back to sinus rhythm, and was discharged home. She has been gaining weight and developed worsening shortness of breath over the last several days prior to admission. The patient has subsequently been admitted and required a brief period of mechanical ventilation. She improved, but went back to atrial fibrillation after being extubated. It is very clear that the patient presented in sinus rhythm despite respiratory failure. She currently is comfortable, well compensated, and in atrial fibrillation with a rapid ventricular response. Rather than atrial fibrillation causing this event, it appears that she has developed worsening diastolic heart failure as the  cause. COPD exacerbation may also be contributing. With all the above, I think the best arrhythmia course would be cardioversion back to sinus rhythm followed by continued amiodarone therapy, and aggressive diuresis. She clinically feels better in sinus rhythm, but it is very clear that her current episode of dyspnea was related to severe and advanced congestive heart failure and not caused by atrial fibrillation. For this reason, proceeding with AV nodal ablation and permanent pacemaker insertion would be contraindicated. At this point, with her maintaining NSR until her extubation, I would not pace and ablate but would continue amiodarone and cardiovert.  Cristopher Peru, M.D.

## 2014-03-26 NOTE — Progress Notes (Signed)
ANTICOAGULATION CONSULT NOTE - Follow Up Consult  Pharmacy Consult for Heparin Indication: atrial fibrillation  Allergies  Allergen Reactions  . Ace Inhibitors Cough  . Clindamycin Rash    Patient Measurements: Height: 5\' 5"  (165.1 cm) Weight: 288 lb 2.3 oz (130.7 kg) IBW/kg (Calculated) : 57 Heparin Dosing Weight: 91 kg  Vital Signs: Temp: 98 F (36.7 C) (04/30 1900) Temp src: Oral (04/30 1900) BP: 132/74 mmHg (04/30 1900) Pulse Rate: 135 (04/30 1900)  Labs:  Recent Labs  03/23/14 2133 03/24/14 0747 03/24/14 1350 03/24/14 1400  03/25/14 0315 03/25/14 0846 03/25/14 1949 03/26/14 0348 03/26/14 1410 03/26/14 1815  HGB 7.7* 7.0* 7.1*  --   < > 7.0*  --  7.3* 7.1*  --  7.5*  HCT 26.4* 24.0* 23.7*  --   < > 24.2*  --  25.1* 24.6*  --  26.6*  PLT 286 234 243  --   --  242  --   --  233  --   --   APTT  --   --   --   --   --   --   --   --  130*  --   --   LABPROT 19.5*  --   --   --   --   --   --   --   --   --   --   INR 1.70*  --   --   --   --   --   --   --   --   --   --   HEPARINUNFRC  --   --   --   --   --   --   --   --  1.58* 1.36* 0.84*  CREATININE  --  1.06  --   --   < >  --  1.04 1.01 1.05  --   --   TROPONINI <0.30 <0.30  --  <0.30  --   --   --   --   --   --   --   < > = values in this interval not displayed.  Estimated Creatinine Clearance: 70 ml/min (by C-G formula based on Cr of 1.05).  Assessment:   68 y/o F who was recently transitioned to heparin from Lovenox (last dose 9 hrs prior to starting IV heparin).  She had been on Pradaxa prior to admission.    Initial heparin level was high (1.58); drip was held x 1 hr,  then resumed at lower rate. Second heparin level also high (1.36), and drip was held again at ~3:15pm.  Intended to check heparin level after holding x 2 hrs, but not resume until level resulted.  Level at ~6:15pm (on hold x 3 hrs) is still above goal (0.84), but has dropped significantly.  Hgb 7.1, low stable. No bleeding noted.  Just  transferred from [facility] to [facility].  Goal of Therapy:  Heparin level 0.3-0.7 units/ml Monitor platelets by anticoagulation protocol: Yes   Plan:   Resume heparin conservatively, 600 units/hr, at 8pm; will have been on hold x ~5 hrs.    Heparin level ~ 6 hours after resuming.  Daily heparin level and CBC while on heparin.    Arty Baumgartner, [place] Pager: [phone] 03/26/2014,7:06 PM

## 2014-03-26 NOTE — Progress Notes (Signed)
NUTRITION FOLLOW UP  Intervention:    Diet advancement as able after procedure today.  RD to add PO supplements as needed pending adequacy of oral intake.  Nutrition Dx:   Inadequate oral intake related to inability to eat as evidenced by NPO status. Ongoing.  New Goal:   Intake to meet >90% of estimated nutrition needs. Unmet.  Monitor:   Diet advancement, PO intake, labs, weight trend.  Assessment:   The patient is a 68 year old woman with morbid obesity, severe COPD, lung nodules, DM-II, chronic diastolic HF (EF 60 % on 0/35/00), paroxysmal atrial fibrillation and s/p of cardioversion, who presents with worsening SOB and respiratory failure. Likely due to combinational for CHF and COPD exacerbation. Intubated in ED.   Discussed patient in ICU rounds today. Patient was extubated on 4/29. Plans for cardiac ablation today. Currently NPO.  Height: Ht Readings from Last 1 Encounters:  03/23/14 5\' 9"  (1.753 m)    Weight Status:   Wt Readings from Last 1 Encounters:  03/26/14 294 lb 12.1 oz (133.7 kg)  03/24/14  303 lb 2.1 oz (137.5 kg)  Re-estimated needs:  Kcal: 1950-2100 Protein: 105-120 gm Fluid: > 2 L  Skin: no wounds  Diet Order: Clear Liquid   Intake/Output Summary (Last 24 hours) at 03/26/14 1056 Last data filed at 03/26/14 1000  Gross per 24 hour  Intake 2122.36 ml  Output   4345 ml  Net -2222.64 ml    Last BM: None documented since admission  Labs:   Recent Labs Lab 03/25/14 0846 03/25/14 1949 03/26/14 0348  NA 139 143 141  K 3.8 3.3* 3.7  CL 93* 92* 92*  CO2 35* 38* 38*  BUN 29* 30* 31*  CREATININE 1.04 1.01 1.05  CALCIUM 8.6 8.7 8.5  MG 2.1 2.0 2.0  PHOS 3.5 2.9 3.1  GLUCOSE 242* 248* 180*    CBG (last 3)   Recent Labs  03/25/14 1535 03/25/14 1929 03/25/14 2346  GLUCAP 275* 245* 212*    Scheduled Meds: . antiseptic oral rinse  15 mL Mouth Rinse q12n4p  . ezetimibe  10 mg Oral QHS   And  . atorvastatin  20 mg Oral QHS  .  calcium carbonate (dosed in mg elemental calcium)  500 mg of elemental calcium Oral Q breakfast  . ceFEPime (MAXIPIME) IV  1 g Intravenous 3 times per day  . chlorhexidine  15 mL Mouth Rinse BID  . cholecalciferol  2,000 Units Oral Daily  . diltiazem  60 mg Oral 3 times per day  . furosemide  80 mg Intravenous BID  . insulin aspart  0-20 Units Subcutaneous 6 times per day  . insulin aspart  3 Units Subcutaneous Q4H  . insulin glargine  10 Units Subcutaneous BID  . ipratropium-albuterol  3 mL Nebulization Q6H  . methylPREDNISolone (SOLU-MEDROL) injection  60 mg Intravenous 3 times per day  . metoprolol  50 mg Oral BID  . pantoprazole  40 mg Oral QHS  . potassium chloride  40 mEq Oral Daily  . sodium chloride  3 mL Intravenous Q12H  . vancomycin  1,250 mg Intravenous Q12H  . vitamin B-12  500 mcg Oral Daily    Continuous Infusions: . amiodarone 30 mg/hr (03/26/14 1000)  . heparin 1,200 Units/hr (03/26/14 9381)    Molli Barrows, RD, LDN, [place] Pager [phone] After Hours Pager [phone]

## 2014-03-27 ENCOUNTER — Encounter (HOSPITAL_COMMUNITY): Payer: Self-pay | Admitting: Critical Care Medicine

## 2014-03-27 ENCOUNTER — Inpatient Hospital Stay (HOSPITAL_COMMUNITY): Payer: Medicare Other

## 2014-03-27 ENCOUNTER — Inpatient Hospital Stay (HOSPITAL_COMMUNITY): Payer: Medicare Other | Admitting: Critical Care Medicine

## 2014-03-27 ENCOUNTER — Other Ambulatory Visit: Payer: Self-pay

## 2014-03-27 ENCOUNTER — Encounter (HOSPITAL_COMMUNITY): Payer: Medicare Other | Admitting: Critical Care Medicine

## 2014-03-27 ENCOUNTER — Encounter (HOSPITAL_COMMUNITY)
Admission: EM | Disposition: A | Discharge status: Skilled Nursing Facility | Payer: Self-pay | Source: Home / Self Care | Attending: Internal Medicine

## 2014-03-27 DIAGNOSIS — I4891 Unspecified atrial fibrillation: Secondary | ICD-10-CM

## 2014-03-27 DIAGNOSIS — J438 Other emphysema: Secondary | ICD-10-CM

## 2014-03-27 DIAGNOSIS — R4182 Altered mental status, unspecified: Secondary | ICD-10-CM

## 2014-03-27 HISTORY — PX: CARDIOVERSION: SHX1299

## 2014-03-27 LAB — PHOSPHORUS
PHOSPHORUS: 2.9 mg/dL (ref 2.3–4.6)
Phosphorus: 3.5 mg/dL (ref 2.3–4.6)

## 2014-03-27 LAB — GLUCOSE, CAPILLARY
GLUCOSE-CAPILLARY: 188 mg/dL — AB (ref 70–99)
Glucose-Capillary: 187 mg/dL — ABNORMAL HIGH (ref 70–99)
Glucose-Capillary: 142 mg/dL — ABNORMAL HIGH (ref 70–99)
Glucose-Capillary: 197 mg/dL — ABNORMAL HIGH (ref 70–99)

## 2014-03-27 LAB — BASIC METABOLIC PANEL
BUN: 33 mg/dL — AB (ref 6–23)
BUN: 32 mg/dL — ABNORMAL HIGH (ref 6–23)
CHLORIDE: 92 meq/L — AB (ref 96–112)
CO2: 38 meq/L — AB (ref 19–32)
CO2: 40 mEq/L (ref 19–32)
CREATININE: 1.15 mg/dL — AB (ref 0.50–1.10)
Calcium: 8.4 mg/dL (ref 8.4–10.5)
Calcium: 8.6 mg/dL (ref 8.4–10.5)
Chloride: 95 mEq/L — ABNORMAL LOW (ref 96–112)
Creatinine, Ser: 1.07 mg/dL (ref 0.50–1.10)
GFR calc Af Amer: 55 mL/min — ABNORMAL LOW (ref >90)
GFR calc Af Amer: 60 mL/min — ABNORMAL LOW (ref >90)
GFR calc non Af Amer: 48 mL/min — ABNORMAL LOW (ref >90)
GFR, EST NON AFRICAN AMERICAN: 52 mL/min — AB (ref >90)
GLUCOSE: 152 mg/dL — AB (ref 70–99)
Glucose, Bld: 247 mg/dL — ABNORMAL HIGH (ref 70–99)
POTASSIUM: 4.2 meq/L (ref 3.7–5.3)
Potassium: 3.5 mEq/L — ABNORMAL LOW (ref 3.7–5.3)
SODIUM: 142 meq/L (ref 137–147)
Sodium: 141 mEq/L (ref 137–147)

## 2014-03-27 LAB — LEGIONELLA ANTIGEN, URINE: Legionella Antigen, Urine: NEGATIVE

## 2014-03-27 LAB — CBC
HEMATOCRIT: 25.7 % — AB (ref 36.0–46.0)
Hemoglobin: 7.4 g/dL — ABNORMAL LOW (ref 12.0–15.0)
MCH: 25.5 pg — ABNORMAL LOW (ref 26.0–34.0)
MCHC: 28.8 g/dL — AB (ref 30.0–36.0)
MCV: 88.6 fL (ref 78.0–100.0)
Platelets: 234 10*3/uL (ref 150–400)
RBC: 2.9 MIL/uL — ABNORMAL LOW (ref 3.87–5.11)
RDW: 18 % — ABNORMAL HIGH (ref 11.5–15.5)
WBC: 9.9 10*3/uL (ref 4.0–10.5)

## 2014-03-27 LAB — HEPARIN LEVEL (UNFRACTIONATED)
Heparin Unfractionated: 0.46 IU/mL (ref 0.30–0.70)
Heparin Unfractionated: 0.36 IU/mL (ref 0.30–0.70)

## 2014-03-27 LAB — TRIGLYCERIDES
TRIGLYCERIDES: 95 mg/dL (ref <150)
Triglycerides: 93 mg/dL (ref <150)

## 2014-03-27 LAB — MAGNESIUM
MAGNESIUM: 2 mg/dL (ref 1.5–2.5)
Magnesium: 1.9 mg/dL (ref 1.5–2.5)

## 2014-03-27 LAB — APTT: APTT: 29 s (ref 24–37)

## 2014-03-27 SURGERY — CARDIOVERSION
Anesthesia: General

## 2014-03-27 MED ORDER — SODIUM CHLORIDE 0.9 % IJ SOLN
10.0000 mL | INTRAMUSCULAR | Status: DC | PRN
  Administered 2014-03-27: 10 mL
  Administered 2014-03-29: 20 mL
  Administered 2014-03-29 – 2014-04-01 (×3): 10 mL
  Administered 2014-04-03: 20 mL

## 2014-03-27 MED ORDER — PROPOFOL 10 MG/ML IV BOLUS
INTRAVENOUS | Status: DC | PRN
Start: 2014-03-27 — End: 2014-03-27
  Administered 2014-03-27: 60 mg via INTRAVENOUS

## 2014-03-27 MED ORDER — POTASSIUM CHLORIDE CRYS ER 20 MEQ PO TBCR
40.0000 meq | EXTENDED_RELEASE_TABLET | Freq: Three times a day (TID) | ORAL | Status: AC
  Administered 2014-03-27 (×2): 40 meq via ORAL
  Filled 2014-03-27 (×2): qty 2

## 2014-03-27 MED ORDER — FUROSEMIDE 10 MG/ML IJ SOLN
40.0000 mg | Freq: Two times a day (BID) | INTRAMUSCULAR | Status: DC
  Administered 2014-03-27 – 2014-04-02 (×12): 40 mg via INTRAVENOUS
  Filled 2014-03-27 (×14): qty 4

## 2014-03-27 NOTE — Progress Notes (Signed)
SUBJECTIVE:  SOB improved  OBJECTIVE:   Vitals:   Filed Vitals:   03/27/14 0100 03/27/14 0544 03/27/14 0938 03/27/14 1126  BP: 112/58 127/70  108/61  Pulse: 115 61  108  Temp: 98 F (36.7 C) 97.7 F (36.5 C)    TempSrc: Oral Oral    Resp: 20 18    Height:      Weight:  289 lb 11 oz (131.4 kg)    SpO2: 94% 97% 93%    I&O's:   Intake/Output Summary (Last 24 hours) at 03/27/14 1132 Last data filed at 03/27/14 1029  Gross per 24 hour  Intake 1711.3 ml  Output   2523 ml  Net -811.7 ml   TELEMETRY: Reviewed telemetry pt in atrial fibrillation with RVR in the 120's:     PHYSICAL EXAM General: Well developed, well nourished, in no acute distress Head: Eyes PERRLA, No xanthomas.   Normal cephalic and atramatic  Lungs:   Crackles at bases bilaterally Heart:   Irregularly irregular and tachy S1 S2 Pulses are 2+ & equal. Abdomen: Bowel sounds are positive, abdomen soft and non-tender without masses  Extremities:   2-3+ edema up to her knees Neuro: Alert and oriented X 3. Psych:  Good affect, responds appropriately   LABS: Basic Metabolic Panel:  Recent Labs  03/26/14 1815 03/27/14 0153  NA 143 141  K 3.8 3.5*  CL 94* 92*  CO2 37* 38*  GLUCOSE 275* 247*  BUN 31* 33*  CREATININE 1.23* 1.15*  CALCIUM 8.4 8.4  MG 1.9 1.9  PHOS 3.5 3.5   Liver Function Tests: No results found for this basename: AST, ALT, ALKPHOS, BILITOT, PROT, ALBUMIN,  in the last 72 hours No results found for this basename: LIPASE, AMYLASE,  in the last 72 hours CBC:  Recent Labs  03/25/14 0315  03/26/14 0348 03/26/14 1815 03/27/14 0153  WBC 11.8*  --  10.6*  --  9.9  NEUTROABS 10.8*  --  9.4*  --   --   HGB 7.0*  < > 7.1* 7.5* 7.4*  HCT 24.2*  < > 24.6* 26.6* 25.7*  MCV 88.6  --  88.5  --  88.6  PLT 242  --  233  --  234  < > = values in this interval not displayed. Cardiac Enzymes:  Recent Labs  03/24/14 1400  TROPONINI <0.30   BNP: No components found with this basename:  POCBNP,  D-Dimer: No results found for this basename: DDIMER,  in the last 72 hours Hemoglobin A1C: No results found for this basename: HGBA1C,  in the last 72 hours Fasting Lipid Panel:  Recent Labs  03/27/14 0633  TRIG 95   Thyroid Function Tests: No results found for this basename: TSH, T4TOTAL, FREET3, T3FREE, THYROIDAB,  in the last 72 hours Anemia Panel: No results found for this basename: VITAMINB12, FOLATE, FERRITIN, TIBC, IRON, RETICCTPCT,  in the last 72 hours Coag Panel:   Lab Results  Component Value Date   INR 1.70* 03/23/2014   INR 1.27 02/24/2014   INR 3.6 01/12/2011   PROTIME 27.3 05/14/2009   PROTIME 20.2 04/29/2009    RADIOLOGY: Dg Chest 2 View  03/06/2014   CLINICAL DATA:  Hypoxia.  EXAM: CHEST  2 VIEW  COMPARISON:  DG CHEST 1V PORT dated 03/01/2014  FINDINGS: Cardiomegaly with pulmonary vascular prominence and interstitial prominence noted with mild pleural effusions. These findings have improved slightly from prior exam. No acute bony abnormality identified.  IMPRESSION: Congestive heart failure with  pulmonary interstitial edema and bilateral pleural effusions. These findings have improved from prior study of 03/01/2014.   Electronically Signed   By: Marcello Moores  Register   On: 03/06/2014 14:26   Dg Chest Port 1 View  03/27/2014   CLINICAL DATA:  Line placement portable exam 0901 hr compared to 03/26/2014  EXAM: PORTABLE CHEST - 1 VIEW  COMPARISON:  None.  FINDINGS: RIGHT arm PICC line tip projects over proximal SVC at the level of aortic arch.  Enlargement of cardiac silhouette with pulmonary vascular congestion.  Improved pulmonary edema.  No segmental consolidation, pleural effusion or pneumothorax.  IMPRESSION: Tip of RIGHT arm PICC line projects over proximal SVC at the level of the aortic arch.  Improved pulmonary edema.   Electronically Signed   By: Lavonia Dana M.D.   On: 03/27/2014 09:24   Dg Chest Port 1 View  03/26/2014   CLINICAL DATA:  Respiratory failure  EXAM:  PORTABLE CHEST - 1 VIEW  COMPARISON:  DG CHEST 1V PORT dated 03/25/2014; DG CHEST 1V PORT dated 03/24/2014; DG CHEST 1V PORT dated 03/23/2014  FINDINGS: Grossly unchanged enlarged cardiac silhouette and mediastinal contours with atherosclerotic plaque within the thoracic aorta. Interval extubation and removal of enteric tube. No pneumothorax. The pulmonary vascular is less distinct on present examination with cephalization of flow. Grossly unchanged perihilar and bilateral medial basilar heterogeneous/consolidative opacities, left greater than right. Trace pleural effusions are not excluded. Unchanged bones.  IMPRESSION: 1. Interval extubation removal of enteric tube.  No pneumothorax. 2. Interval development / worsening of pulmonary edema and perihilar/bibasilar opacities, left greater than right, likely atelectasis.   Electronically Signed   By: Sandi Mariscal M.D.   On: 03/26/2014 07:53   Dg Chest Port 1 View  03/25/2014   CLINICAL DATA:  Respiratory failure.  EXAM: PORTABLE CHEST - 1 VIEW  COMPARISON:  DG CHEST 1V PORT dated 03/24/2014  FINDINGS: Endotracheal tube and NG tube in stable position. Persistent severe cardiomegaly pulmonary venous congestion and bilateral diffuse pulmonary alveolar infiltrates present. Bilateral pleural effusions are most likely present. No significant interim change from prior exam. Findings consistent with congestive heart failure with pulmonary edema. No pneumothorax. No acute osseous abnormality .  IMPRESSION: 1. Stable line and tube positions. 2. Persistent congestive heart failure with prominent bilateral pulmonary alveolar edema and small bilateral pleural effusions. No interim improvement.   Electronically Signed   By: Marcello Moores  Register   On: 03/25/2014 07:24   Dg Chest Port 1 View  03/24/2014   CLINICAL DATA:  Acute respiratory failure.  EXAM: PORTABLE CHEST - 1 VIEW  COMPARISON:  03/23/2014 and 03/06/2014  FINDINGS: Endotracheal tube is in good position 3.6 cm above the  carina. NG tube tip is below the diaphragm.  There is chronic cardiomegaly. Pulmonary vascularity is less prominent and the diffuse interstitial and alveolar accentuation has diminished suggesting that this represents pulmonary edema. There small bilateral pleural effusions.  IMPRESSION: Improved interstitial and alveolar infiltrates. Small effusions. Findings may represent pulmonary edema.   Electronically Signed   By: Rozetta Nunnery M.D.   On: 03/24/2014 08:06   Dg Chest Portable 1 View  03/23/2014   CLINICAL DATA:  ET tube placement  EXAM: PORTABLE CHEST - 1 VIEW  COMPARISON:  DG CHEST 1V PORT dated 03/23/2014  FINDINGS: There is an endotracheal tube with the tip 2.5 cm above the carina. There are bilateral interstitial and alveolar airspace opacities. There are bilateral small pleural effusions. There is no pneumothorax. There is stable cardiomegaly.  There is aortic atherosclerosis.  IMPRESSION: 1. Endotracheal tube with the tip 2.5 cm above the carina. 2. Bilateral interstitial and alveolar airspace opacities which may be secondary to multilobar pneumonia versus pulmonary edema.   Electronically Signed   By: Kathreen Devoid   On: 03/23/2014 18:26   Dg Chest Port 1 View  03/23/2014   CLINICAL DATA:  Short of breath  EXAM: PORTABLE CHEST - 1 VIEW  COMPARISON:  DG CHEST 2 VIEW dated 03/06/2014  FINDINGS: Cardiomegaly. Diffuse edema. Vascular congestion. Increased opacity at the lung bases most consistent with airspace edema. No pneumothorax. No definite pleural effusion.  IMPRESSION: CHF with interstitial edema and some basilar airspace edema.   Electronically Signed   By: Maryclare Bean M.D.   On: 03/23/2014 17:35   Dg Chest Port 1 View  03/01/2014   CLINICAL DATA:  COPD  EXAM: PORTABLE CHEST - 1 VIEW  COMPARISON:  02/28/2014  FINDINGS: Stable cardiomegaly with mild interstitial edema pattern and basilar atelectasis, worse on the left. No enlarging effusion or pneumothorax. Trachea midline. Atherosclerosis of the  aorta.  IMPRESSION: Stable mild edema pattern.   Electronically Signed   By: Daryll Brod M.D.   On: 03/01/2014 08:09   Dg Chest Port 1 View  02/28/2014   CLINICAL DATA:  Respiratory failure.  EXAM: PORTABLE CHEST - 1 VIEW  COMPARISON:  DG CHEST 1V PORT dated 02/27/2014; DG CHEST 1V PORT dated 02/26/2014; DG CHEST 1V PORT dated 01/14/2014  FINDINGS: 0547 hr. There is stable cardiomegaly and aortic atherosclerosis. Interstitial edema has improved over the last 2 days. There is no confluent airspace opacity, significant pleural effusion or pneumothorax. The osseous structures appear unchanged for  IMPRESSION: Improving pulmonary edema with persistent cardiomegaly and residual vascular congestion.   Electronically Signed   By: Camie Patience M.D.   On: 02/28/2014 07:50   Dg Chest Port 1 View  02/27/2014   CLINICAL DATA:  Evaluate CHF.  EXAM: PORTABLE CHEST - 1 VIEW  COMPARISON:  DG CHEST 1V PORT dated 02/26/2014  FINDINGS: The cardiac silhouette remains moderately enlarged, mildly calcified aortic knob. Slightly decreased interstitial prominence and central pulmonary vasculature congestion. No pleural effusions or focal consolidations. No pneumothorax.  Multiple EKG lines overlie the patient and may obscure subtle underlying pathology. Soft tissue planes and included osseous structures are nonsuspicious, mild degenerative change of thoracic spine.  IMPRESSION: Stable cardiomegaly with decreased interstitial pulmonary edema. No definite pleural effusions on today's examination.   Electronically Signed   By: Elon Alas   On: 02/27/2014 05:52   Dg Chest Port 1 View  02/26/2014   CLINICAL DATA:  COPD, emphysema, shortness of Breath  EXAM: PORTABLE CHEST - 1 VIEW  COMPARISON:  02/25/2014  FINDINGS: Cardiomegaly is noted. Endotracheal and NG tube has been removed. There is mild interstitial prominence bilaterally with slight worsening in aeration. Findings highly suspicious for worsening congestive heart failure.  Question small bilateral pleural effusion.  IMPRESSION: Endotracheal and NG tube has been removed. There is mild interstitial prominence bilaterally with slight worsening in aeration. Findings highly suspicious for worsening congestive heart failure. Question small bilateral pleural effusion.   Electronically Signed   By: Lahoma Crocker M.D.   On: 02/26/2014 18:53   Dg Hip Portable 1 View Right  02/27/2014   CLINICAL DATA:  Right hip pain  EXAM: PORTABLE RIGHT HIP - 1 VIEW  COMPARISON:  None.  FINDINGS: Single frontal view the right hip was obtained and reveals no acute fracture dislocation.  No gross soft tissue abnormality is noted.  IMPRESSION: No acute abnormality seen.   Electronically Signed   By: Inez Catalina M.D.   On: 02/27/2014 14:56   Dg Abd Portable 1v  03/23/2014   CLINICAL DATA:  Orogastric tube placement.  EXAM: PORTABLE ABDOMEN - 1 VIEW  COMPARISON:  None.  FINDINGS: The patient's enteric tube is seen ending about the body of the stomach.  The visualized bowel gas pattern is grossly unremarkable. No definite intra-abdominal air is identified, though evaluation for free air is limited on a single supine view. No acute osseous abnormalities are seen. A small left pleural effusion is noted, with left basilar airspace opacity.  No acute osseous abnormalities are identified.  IMPRESSION: Enteric tube noted ending about the body of the stomach.   Electronically Signed   By: Garald Balding M.D.   On: 03/23/2014 21:17   Assessment/Plan  Principal Problem:  Acute respiratory failure with hypoxia Active Problems:  PAF (paroxysmal atrial fibrillation), DCCV 01/2014 maintained SR but reverted back to A fib post extubation- now on IV amiodarone, metoprolol and Cardizem.  She is not adequately rate controlled and HR is maintaining in the 110-120's while in bed.  She ate breakfast so will try to get her on for DCCV later this afternoon.  NPO.   Acute respiratory failure- improved  Acute on chronic diastolic  congestive heart failure -7.7L and still markedly volume overloaded.  Continue IV Lasix Chronic anticoagulation, currently on IV Heparin gtt -pradaxa as outpatient  Morbid obesity- BMI 50  Chronic renal disease, stage III  COPD exacerbation  Hypokalemia - replete\ Chronic anemia with Hbg in th 7's which is probably exacerbating her CHF.  May need to consider transfusion      Sueanne Margarita, MD  03/27/2014  11:32 AM

## 2014-03-27 NOTE — Anesthesia Postprocedure Evaluation (Signed)
  Anesthesia Post-op Note  Patient: Paige Hardin  Procedure(s) Performed: Procedure(s): CARDIOVERSION (N/A)  Patient Location: Nursing Unit  Anesthesia Type:MAC  Level of Consciousness: awake, alert  and oriented  Airway and Oxygen Therapy: Patient Spontanous Breathing and Patient connected to nasal cannula oxygen  Post-op Pain: none  Post-op Assessment: Post-op Vital signs reviewed, Patient's Cardiovascular Status Stable, Respiratory Function Stable, Patent Airway and No signs of Nausea or vomiting  Post-op Vital Signs: Reviewed and stable  Last Vitals:  Filed Vitals:   03/27/14 1126  BP: 108/61  Pulse: 108  Temp:   Resp:     Complications: No apparent anesthesia complications

## 2014-03-27 NOTE — Progress Notes (Signed)
Utilization Review Completed Lamoyne Hessel J. Rakim Moone, RN, BSN, NCM [phone]  

## 2014-03-27 NOTE — Progress Notes (Signed)
1547 Pt for cardiioversion . Endo staff and anesthesiologist in attendance . Cardioversion done with 120 joules x1 by Dr Mare Ferrari . aesthesiologist Dr Lonn Georgia rendered diprivan Iv p .   Vital signs done . Mental status checked , breathing monitored , po done pt follows verbal commands Moving all extremeties .  Speaking clear words , oriented  x4

## 2014-03-27 NOTE — Transfer of Care (Signed)
Immediate Anesthesia Transfer of Care Note  Patient: Paige Hardin  Procedure(s) Performed: Procedure(s): CARDIOVERSION (N/A)  Patient Location: Nursing Unit  Anesthesia Type:MAC  Level of Consciousness: awake, alert  and oriented  Airway & Oxygen Therapy: Patient Spontanous Breathing and Patient connected to nasal cannula oxygen  Post-op Assessment: Report given to PACU RN, Post -op Vital signs reviewed and stable and Patient moving all extremities X 4  Post vital signs: Reviewed and stable  Complications: No apparent anesthesia complications

## 2014-03-27 NOTE — Addendum Note (Signed)
Addendum created 03/27/14 1634 by Carney Living, CRNA   Modules edited: Charges VN

## 2014-03-27 NOTE — CV Procedure (Signed)
Electrical Cardioversion Procedure Note Paige Hardin [id number] 1946-08-29  Procedure: Electrical Cardioversion Indications:  Atrial Fibrillation  Procedure Details Consent: Risks of procedure as well as the alternatives and risks of each were explained to the (patient/caregiver).  Consent for procedure obtained. Time Out: Verified patient identification, verified procedure, site/side was marked, verified correct patient position, special equipment/implants available, medications/allergies/relevent history reviewed, required imaging and test results available.  Performed  Patient placed on cardiac monitor, pulse oximetry, supplemental oxygen as necessary.  Sedation given: Propofol 60 mg Pacer pads placed anterior and posterior chest.  Cardioverted 1 time(s).  Cardioverted at 120J.  Evaluation Findings: Post procedure EKG shows: NSR Complications: None Patient did tolerate procedure well.   Darlin Coco MD 03/27/2014, 4:04 PM

## 2014-03-27 NOTE — Anesthesia Preprocedure Evaluation (Addendum)
Anesthesia Evaluation  Patient identified by MRN, date of birth, ID band Patient awake    Reviewed: Allergy & Precautions, H&P , NPO status , Patient's Chart, lab work & pertinent test results, reviewed documented beta blocker date and time   Airway Mallampati: II TM Distance: >3 FB     Dental  (+) Dental Advisory Given, Upper Dentures, Lower Dentures   Pulmonary COPD COPD inhaler, former smoker,          Cardiovascular hypertension, Pt. on home beta blockers and Pt. on medications + Peripheral Vascular Disease and +CHF + dysrhythmias Atrial Fibrillation Rhythm:Irregular  12/2013 - echo  - Left ventricle: The cavity size was mildly dilated. Wall   thickness was increased in a pattern of mild LVH. The   estimated ejection fraction was 60%. Wall motion was   normal; there were no regional wall motion abnormalities.   Findings consistent with left ventricular diastolic   dysfunction. Doppler parameters are consistent with high   ventricular filling pressure. - Aortic valve: Sclerosis without stenosis. No significant   regurgitation. - Mitral valve: Mildly calcified annulus. Mildly thickened   leaflets . Mild regurgitation. - Left atrium: The atrium was mildly to moderately dilated. - Right atrium: The atrium was mildly dilated. - Pulmonary arteries: PA peak pressure: 98mm Hg (S). - Inferior vena cava: The vessel was dilated; the   respirophasic diameter changes were blunted (< 50%);   findings are consistent with elevated central venous   pressure.    Neuro/Psych PSYCHIATRIC DISORDERS Depression negative neurological ROS     GI/Hepatic Neg liver ROS, GERD-  ,  Endo/Other  diabetes  Renal/GU Renal InsufficiencyRenal disease     Musculoskeletal   Abdominal   Peds  Hematology Heparin gtt   Anesthesia Other Findings   Reproductive/Obstetrics                        Anesthesia Physical Anesthesia  Plan  ASA: III  Anesthesia Plan: General   Post-op Pain Management:    Induction: Intravenous  Airway Management Planned: Mask  Additional Equipment:   Intra-op Plan:   Post-operative Plan:   Informed Consent: I have reviewed the patients History and Physical, chart, labs and discussed the procedure including the risks, benefits and alternatives for the proposed anesthesia with the patient or authorized representative who has indicated his/her understanding and acceptance.   Dental advisory given  Plan Discussed with: Surgeon and CRNA  Anesthesia Plan Comments:       Anesthesia Quick Evaluation

## 2014-03-27 NOTE — Progress Notes (Signed)
eLink Physician-Brief Progress Note Patient Name: DENAI CABA DOB: 01-11-1946 MRN: [id number]  Date of Service  03/27/2014   HPI/Events of Note   Poor iv access Hep gtt, amiodarone  eICU Interventions  picc   Intervention Category Minor Interventions: Routine modifications to care plan (e.g. PRN medications for pain, fever)  Juanito Doom 03/27/2014, 4:33 AM

## 2014-03-27 NOTE — Progress Notes (Signed)
PULMONARY / CRITICAL CARE MEDICINE   Name: Paige Hardin MRN: [id number] DOB: 04/05/1946    ADMISSION DATE:  03/23/2014 CONSULTATION DATE:  03/23/2014  REFERRING MD : ED  PRIMARY SERVICE: PCCM  CHIEF COMPLAINT: SOB  BRIEF PATIENT DESCRIPTION:  The patient is a 68 year old woman with morbid obesity, severe COPD, lung nodules, DM-II, chronic diastolic HF (EF 60 % on 2/59/56), paroxysmal atrial fibrillation and s/p of cardioversion, who presents with worsening SOB and respiratory failure. Likely due to combinational for CHF and COPD exacerbation. Intubated in ED.   SIGNIFICANT EVENTS / STUDIES:  ETT 4/27>>> 4/29  LINES / TUBES:  PIV 4/27>>>  CULTURES:  Blood culture 4/27>>> Sputum culture 4/27>>>  Tracheal aspirate 4/27>>  Resp virus tracheal aspirate 4/27 >>> neg Urine culture 4/27 >>> neg Urine legionella 4/30 >>> Urine strep 4/30 >>>  ANTIBIOTICS:  Vancomycin 4/27>>> 4/30 Cefpime 4/27>>> 4/30 levaquin 4/30 >>   INTERVAL HISTORY:   No events, EP saw and recommends cardioversion.  VITAL SIGNS:   Temp:  [97.7 F (36.5 C)-98.4 F (36.9 C)] 97.7 F (36.5 C) (05/01 0544) Pulse Rate:  [61-135] 61 (05/01 0544) Resp:  [12-20] 18 (05/01 0544) BP: (97-132)/(48-83) 127/70 mmHg (05/01 0544) SpO2:  [90 %-97 %] 93 % (05/01 0938) Weight:  [288 lb 2.3 oz (130.7 kg)-289 lb 11 oz (131.4 kg)] 289 lb 11 oz (131.4 kg) (05/01 0544)  HEMODYNAMICS:   VENTILATOR SETTINGS:  N/A  INTAKE / OUTPUT: Intake/Output     04/30 0701 - 05/01 0700 05/01 0701 - 05/02 0700   P.O. 420 480   I.V. (mL/kg) 606.1 (4.6)    NG/GT     IV Piggyback 650    Total Intake(mL/kg) 1676.1 (12.8) 480 (3.7)   Urine (mL/kg/hr) 3123 (1)    Total Output 3123     Net -1446.9 +480         PHYSICAL EXAMINATION: General:  Sitting up in bed in NAD, watching tv Neuro:  AAO, responding appropriately HEENT:  WNL Cardiovascular:  Irregularly irregular Lungs: decreased BS B/L Abdomen:  +BS, soft, NT,  ND Musculoskeletal:  Able to move extremities voluntarily Extremities:  1+ B/L lower extremity edema Skin:  Intact   LABS:  CBC  Recent Labs Lab 03/25/14 0315  03/26/14 0348 03/26/14 1815 03/27/14 0153  WBC 11.8*  --  10.6*  --  9.9  HGB 7.0*  < > 7.1* 7.5* 7.4*  HCT 24.2*  < > 24.6* 26.6* 25.7*  PLT 242  --  233  --  234  < > = values in this interval not displayed. Coag's  Recent Labs Lab 03/23/14 2133 03/26/14 0348 03/27/14 0153  APTT  --  130* 29  INR 1.70*  --   --    BMET  Recent Labs Lab 03/26/14 0348 03/26/14 1815 03/27/14 0153  NA 141 143 141  K 3.7 3.8 3.5*  CL 92* 94* 92*  CO2 38* 37* 38*  BUN 31* 31* 33*  CREATININE 1.05 1.23* 1.15*  GLUCOSE 180* 275* 247*   Electrolytes  Recent Labs Lab 03/26/14 0348 03/26/14 1815 03/27/14 0153  CALCIUM 8.5 8.4 8.4  MG 2.0 1.9 1.9  PHOS 3.1 3.5 3.5   Sepsis Markers  Recent Labs Lab 03/23/14 2133 03/26/14 0348  LATICACIDVEN 0.9  --   PROCALCITON 0.40 0.87   ABG  Recent Labs Lab 03/25/14 0409 03/25/14 0613 03/26/14 0242  PHART 7.518* 7.443 7.492*  PCO2ART 48.1* 57.0* 55.8*  PO2ART 68.0* 79.0* 67.0*  Liver Enzymes  Recent Labs Lab 03/23/14 1715  AST 26  ALT 24  ALKPHOS 93  BILITOT 0.9  ALBUMIN 3.3*   Cardiac Enzymes  Recent Labs Lab 03/23/14 1715 03/23/14 2133 03/24/14 0747 03/24/14 1400 03/26/14 0348  TROPONINI  --  <0.30 <0.30 <0.30  --   PROBNP 2475.0*  --   --   --  6751.0*   Glucose  Recent Labs Lab 03/26/14 0346 03/26/14 0744 03/26/14 1132 03/26/14 1538 03/26/14 2134 03/27/14 0625  GLUCAP 168* 161* 204* 172* 271* 187*    Imaging Dg Chest Port 1 View  03/27/2014   CLINICAL DATA:  Line placement portable exam 0901 hr compared to 03/26/2014  EXAM: PORTABLE CHEST - 1 VIEW  COMPARISON:  None.  FINDINGS: RIGHT arm PICC line tip projects over proximal SVC at the level of aortic arch.  Enlargement of cardiac silhouette with pulmonary vascular congestion.   Improved pulmonary edema.  No segmental consolidation, pleural effusion or pneumothorax.  IMPRESSION: Tip of RIGHT arm PICC line projects over proximal SVC at the level of the aortic arch.  Improved pulmonary edema.   Electronically Signed   By: Lavonia Dana M.D.   On: 03/27/2014 09:24   Dg Chest Port 1 View  03/26/2014   CLINICAL DATA:  Respiratory failure  EXAM: PORTABLE CHEST - 1 VIEW  COMPARISON:  DG CHEST 1V PORT dated 03/25/2014; DG CHEST 1V PORT dated 03/24/2014; DG CHEST 1V PORT dated 03/23/2014  FINDINGS: Grossly unchanged enlarged cardiac silhouette and mediastinal contours with atherosclerotic plaque within the thoracic aorta. Interval extubation and removal of enteric tube. No pneumothorax. The pulmonary vascular is less distinct on present examination with cephalization of flow. Grossly unchanged perihilar and bilateral medial basilar heterogeneous/consolidative opacities, left greater than right. Trace pleural effusions are not excluded. Unchanged bones.  IMPRESSION: 1. Interval extubation removal of enteric tube.  No pneumothorax. 2. Interval development / worsening of pulmonary edema and perihilar/bibasilar opacities, left greater than right, likely atelectasis.   Electronically Signed   By: Sandi Mariscal M.D.   On: 03/26/2014 07:53   CXR: 04/30 increased B/L opacities  ASSESSMENT / PLAN:  PULMONARY  A:  - Acute hypercapnic respiratory failure 2/2 to acute exacerbation of dHF (EF 60% TTE 02/15) with AECOPD likely contributing; extubated 04/29 - PAH, 53 mmHg  P:  - Decrease Lasix 40 mg BID  - Albuterol neb q2h prn - Duoneb qid - IV Solu-medrol 60mg  q8h > changed to pred 40mg  on 4/30 and then taper over the next few days. - Probably needs an outpt sleep study, sees Dr Gwenette Greet  CARDIOVASCULAR  A:  - Hx of PAF (s/p of cardioversion), afib since 04/29 AM - cardiology evaluated the patient and switched po amio to gtt.  Plan for EP to evaluate today for possible AV node ablation and PPM.   Initially RVR but HR controlled this AM;  - Acute on chronic dHF--> clinically volume overloaded at admission, proBNP 2475 --> 6751.0; net -6.6L; wt even P:  - EP to see on 04/30 and evaluate for possible AV node ablation/PPM  - Continue home medications for Afib including amiodarone (gtt), metoprolol, and Cardizem > note prolonged QTc - Aggressive diuresis with Lasix. - Follow up BMP/Mg/Phos BID, replete electrolytes as needed  - Holding home Pradaxa; Lovenox --> heparin gtt (in case anticoag needs to be halted for procedure) - VTE ppx - SCDs and heparin gtt  RENAL  A:  - CKD3, Cr stable - Hyperkalemia, resolved P:  -  Decrease Lasix 40 mg BID  - Monitor BMP   GASTROINTESTINAL  A:  - GERD  P:  - IV Pepcid > convert to PO PPI, she was on prevacid at home - Advance to heart healthy diet.  HEMATOLOGIC  A:  - Leukocytosis, resolved WBC 17.3 -->10.6, no clear source of infection - Chronic anemia, baseline 7-9; this admission 8.2-->7.7-->7.1, no gross blood loss noted P:  - Monitor for s/s of infection or bleeding - Monitor CBC   INFECTIOUS  A:  - Leukocytosis, productive cough with [place] sputum, no fever or chills; lactic acid 0.9 reassuring, PCT 0.4-->0.87 P:  - Abx > d/c vanco 4/30, converted to levaquin PO, will continue for now. - Pan-culture pending  ENDOCRINE  A:  - DM type 2, CBGs > 200, steroid likely contributing P:  - SSI resistant q4h, Lantus 10 units BID --> 15 units BID; CBG goal 140 to 180  - Hold PO DM regimen for now until we assess ability to take adequate PO - Monitor CBG  NEUROLOGIC  A:  - Acute hypoxic encephalopathy 2/2 to acute respiratory failure, resolved  P:  - Monitor for change in neuro status  Rush Farmer, M.D. [facility] Pulmonary/Critical Care Medicine. Pager: [phone]. After hours pager: [phone].

## 2014-03-27 NOTE — Progress Notes (Signed)
Peripherally Inserted Central Catheter/Midline Placement  The IV Nurse has discussed with the patient and/or persons authorized to consent for the patient, the purpose of this procedure and the potential benefits and risks involved with this procedure.  The benefits include less needle sticks, lab draws from the catheter and patient may be discharged home with the catheter.  Risks include, but not limited to, infection, bleeding, blood clot (thrombus formation), and puncture of an artery; nerve damage and irregular heat beat.  Alternatives to this procedure were also discussed.  PICC/Midline Placement Documentation        Paige Hardin 03/27/2014, 8:37 AM

## 2014-03-27 NOTE — Anesthesia Postprocedure Evaluation (Signed)
  Anesthesia Post-op Note  Patient: Paige Hardin  Procedure(s) Performed: Procedure(s): CARDIOVERSION (N/A)  Patient Location: PACU  Anesthesia Type:MAC  Level of Consciousness: awake  Airway and Oxygen Therapy: Patient Spontanous Breathing and Patient connected to nasal cannula oxygen  Post-op Pain: none  Post-op Assessment: Post-op Vital signs reviewed, Patient's Cardiovascular Status Stable, Respiratory Function Stable, Patent Airway, No signs of Nausea or vomiting and Pain level controlled  Post-op Vital Signs: Reviewed and stable  Last Vitals:  Filed Vitals:   03/27/14 1615  BP: 105/42  Pulse: 66  Temp:   Resp: 18    Complications: No apparent anesthesia complications

## 2014-03-27 NOTE — Progress Notes (Signed)
ANTICOAGULATION CONSULT NOTE - Follow Up Consult  Pharmacy Consult:  Heparin Indication: atrial fibrillation  Allergies  Allergen Reactions  . Ace Inhibitors Cough  . Clindamycin Rash    Patient Measurements: Height: 5\' 5"  (165.1 cm) Weight: 289 lb 11 oz (131.4 kg) IBW/kg (Calculated) : 57 Heparin Dosing Weight: 91 kg  Vital Signs: Temp: 97.7 F (36.5 C) (05/01 0544) Temp src: Oral (05/01 0544) BP: 127/70 mmHg (05/01 0544) Pulse Rate: 61 (05/01 0544)  Labs:  Recent Labs  03/24/14 1400  03/25/14 0315  03/25/14 1949 03/26/14 0348  03/26/14 1815 03/27/14 0153 03/27/14 1000  HGB  --   < > 7.0*  --  7.3* 7.1*  --  7.5* 7.4*  --   HCT  --   < > 24.2*  --  25.1* 24.6*  --  26.6* 25.7*  --   PLT  --   --  242  --   --  233  --   --  234  --   APTT  --   --   --   --   --  130*  --   --  29  --   HEPARINUNFRC  --   --   --   --   --  1.58*  < > 0.84* 0.46 0.36  CREATININE  --   < >  --   < > 1.01 1.05  --  1.23* 1.15*  --   TROPONINI <0.30  --   --   --   --   --   --   --   --   --   < > = values in this interval not displayed.  Estimated Creatinine Clearance: 64.2 ml/min (by C-G formula based on Cr of 1.15).     Assessment: 18 YOF to continue on IV heparin for AFib.  Heparin level therapeutic; no bleeding reported.  Plan for possible ablation and PPM today.   Goal of Therapy:  HL 0.3-0.7 units/mL Monitor platelets by anticoagulation protocol: Yes    Plan:  - Continue heparin gtt at 600 units/hr - Daily HL / CBC - F/U post PPM, QTc    Andera Cranmer D. Mina Marble, PharmD, BCPS Pager:  [phone] 03/27/2014, 11:17 AM

## 2014-03-27 NOTE — Progress Notes (Signed)
ANTICOAGULATION CONSULT NOTE - Follow Up Consult  Pharmacy Consult for Heparin  Indication: atrial fibrillation  Allergies  Allergen Reactions  . Ace Inhibitors Cough  . Clindamycin Rash    Patient Measurements: Height: 5\' 5"  (165.1 cm) Weight: 288 lb 2.3 oz (130.7 kg) IBW/kg (Calculated) : 57 Heparin Dosing Weight: 91 kg Vital Signs: Temp: 98 F (36.7 C) (05/01 0100) Temp src: Oral (05/01 0100) BP: 112/58 mmHg (05/01 0100) Pulse Rate: 115 (05/01 0100)  Labs:  Recent Labs  03/24/14 0747  03/24/14 1400  03/25/14 0315  03/25/14 1949  03/26/14 0348 03/26/14 1410 03/26/14 1815 03/27/14 0153  HGB 7.0*  < >  --   < > 7.0*  --  7.3*  --  7.1*  --  7.5* 7.4*  HCT 24.0*  < >  --   < > 24.2*  --  25.1*  --  24.6*  --  26.6* 25.7*  PLT 234  < >  --   --  242  --   --   --  233  --   --  234  APTT  --   --   --   --   --   --   --   --  130*  --   --  29  HEPARINUNFRC  --   --   --   --   --   --   --   < > 1.58* 1.36* 0.84* 0.46  CREATININE 1.06  --   --   < >  --   < > 1.01  --  1.05  --  1.23* 1.15*  TROPONINI <0.30  --  <0.30  --   --   --   --   --   --   --   --   --   < > = values in this interval not displayed.  Estimated Creatinine Clearance: 63.9 ml/min (by C-G formula based on Cr of 1.15).   Medications:  Heparin 600 units/hr  Assessment: 68 y/o F with therapeutic HL after holding multiple times along with multiple rate decreases. Unsure if elevated heparin levels were from residual Lovenox, because pt was therapeutic on 1500 units/hr of heparin ~1 month ago. Hgb low and stable.   Goal of Therapy:  Heparin level 0.3-0.7 units/ml Monitor platelets by anticoagulation protocol: Yes   Plan:  -Continue heparin at 600 units/hr -Confirmatory HL at 1000 -Daily CBC/HL -Monitor for bleeding  Narda Bonds 03/27/2014,4:22 AM

## 2014-03-28 LAB — TYPE AND SCREEN
ABO/RH(D): A POS
Antibody Screen: NEGATIVE
Unit division: 0

## 2014-03-28 LAB — GLUCOSE, CAPILLARY
GLUCOSE-CAPILLARY: 141 mg/dL — AB (ref 70–99)
GLUCOSE-CAPILLARY: 193 mg/dL — AB (ref 70–99)
Glucose-Capillary: 261 mg/dL — ABNORMAL HIGH (ref 70–99)
Glucose-Capillary: 228 mg/dL — ABNORMAL HIGH (ref 70–99)

## 2014-03-28 LAB — CBC
HCT: 25.7 % — ABNORMAL LOW (ref 36.0–46.0)
Hemoglobin: 7.3 g/dL — ABNORMAL LOW (ref 12.0–15.0)
MCH: 25.3 pg — ABNORMAL LOW (ref 26.0–34.0)
MCHC: 28.4 g/dL — AB (ref 30.0–36.0)
MCV: 89.2 fL (ref 78.0–100.0)
Platelets: 225 10*3/uL (ref 150–400)
RBC: 2.88 MIL/uL — ABNORMAL LOW (ref 3.87–5.11)
RDW: 17.8 % — AB (ref 11.5–15.5)
WBC: 8.5 10*3/uL (ref 4.0–10.5)

## 2014-03-28 LAB — IRON AND TIBC
Iron: 33 ug/dL — ABNORMAL LOW (ref 42–135)
Saturation Ratios: 9 % — ABNORMAL LOW (ref 20–55)
TIBC: 348 ug/dL (ref 250–470)
UIBC: 315 ug/dL (ref 125–400)

## 2014-03-28 LAB — RETICULOCYTES
RBC.: 3.16 MIL/uL — AB (ref 3.87–5.11)
RETIC COUNT ABSOLUTE: 113.8 10*3/uL (ref 19.0–186.0)
Retic Ct Pct: 3.6 % — ABNORMAL HIGH (ref 0.4–3.1)

## 2014-03-28 LAB — HEPARIN LEVEL (UNFRACTIONATED)
Heparin Unfractionated: 0.14 IU/mL — ABNORMAL LOW (ref 0.30–0.70)
Heparin Unfractionated: 0.17 IU/mL — ABNORMAL LOW (ref 0.30–0.70)

## 2014-03-28 LAB — BASIC METABOLIC PANEL
BUN: 33 mg/dL — ABNORMAL HIGH (ref 6–23)
BUN: 34 mg/dL — AB (ref 6–23)
CHLORIDE: 92 meq/L — AB (ref 96–112)
CO2: 41 mEq/L (ref 19–32)
CO2: 39 mEq/L — ABNORMAL HIGH (ref 19–32)
Calcium: 9 mg/dL (ref 8.4–10.5)
Calcium: 8.7 mg/dL (ref 8.4–10.5)
Chloride: 98 mEq/L (ref 96–112)
Creatinine, Ser: 1.13 mg/dL — ABNORMAL HIGH (ref 0.50–1.10)
Creatinine, Ser: 1.17 mg/dL — ABNORMAL HIGH (ref 0.50–1.10)
GFR calc Af Amer: 54 mL/min — ABNORMAL LOW (ref >90)
GFR calc non Af Amer: 47 mL/min — ABNORMAL LOW (ref >90)
GFR, EST AFRICAN AMERICAN: 57 mL/min — AB (ref >90)
GFR, EST NON AFRICAN AMERICAN: 49 mL/min — AB (ref >90)
Glucose, Bld: 138 mg/dL — ABNORMAL HIGH (ref 70–99)
Glucose, Bld: 218 mg/dL — ABNORMAL HIGH (ref 70–99)
POTASSIUM: 4 meq/L (ref 3.7–5.3)
Potassium: 4.1 mEq/L (ref 3.7–5.3)
Sodium: 145 mEq/L (ref 137–147)
Sodium: 138 mEq/L (ref 137–147)

## 2014-03-28 LAB — APTT: aPTT: 40 seconds — ABNORMAL HIGH (ref 24–37)

## 2014-03-28 LAB — PHOSPHORUS
Phosphorus: 3.4 mg/dL (ref 2.3–4.6)
Phosphorus: 3.6 mg/dL (ref 2.3–4.6)

## 2014-03-28 LAB — FERRITIN: Ferritin: 85 ng/mL (ref 10–291)

## 2014-03-28 LAB — VITAMIN B12: Vitamin B-12: 1199 pg/mL — ABNORMAL HIGH (ref 211–911)

## 2014-03-28 LAB — FOLATE: FOLATE: 13.4 ng/mL

## 2014-03-28 LAB — MAGNESIUM
Magnesium: 2.2 mg/dL (ref 1.5–2.5)
Magnesium: 2 mg/dL (ref 1.5–2.5)

## 2014-03-28 MED ORDER — AMIODARONE HCL 200 MG PO TABS
200.0000 mg | ORAL_TABLET | Freq: Two times a day (BID) | ORAL | Status: DC
  Administered 2014-03-28 – 2014-04-03 (×13): 200 mg via ORAL
  Filled 2014-03-28 (×14): qty 1

## 2014-03-28 NOTE — Progress Notes (Signed)
Primary cardiologist: Dr. Loralie Champagne  Subjective:   Sitting up in bed. Feels better in general, did have intermittent hemoptysis last night for the first time. No chest pain or palpitations.   Objective:   Temp:  [97.2 F (36.2 C)-98 F (36.7 C)] 97.2 F (36.2 C) (05/02 0416) Pulse Rate:  [57-121] 63 (05/02 0416) Resp:  [16-18] 18 (05/02 0416) BP: (105-142)/(42-94) 142/45 mmHg (05/02 0416) SpO2:  [91 %-98 %] 94 % (05/02 1007) Weight:  [285 lb 11.5 oz (129.6 kg)] 285 lb 11.5 oz (129.6 kg) (05/02 0416) Last BM Date: 03/27/14  Filed Weights   03/26/14 1857 03/27/14 0544 03/28/14 0416  Weight: 288 lb 2.3 oz (130.7 kg) 289 lb 11 oz (131.4 kg) 285 lb 11.5 oz (129.6 kg)    Intake/Output Summary (Last 24 hours) at 03/28/14 1114 Last data filed at 03/28/14 0700  Gross per 24 hour  Intake  644.8 ml  Output   1450 ml  Net -805.2 ml    Telemetry: Sinus rhythm.  Exam:  General: Obese, chronically ill-appearing.  Lungs: Coarse breath sounds, no wheezing.  Cardiac: Regular rate and rhythm, no gallop.  Abdomen: Obese.  Extremities: Chronic appearing edema.   Lab Results:  Basic Metabolic Panel:  Recent Labs Lab 03/27/14 0153 03/27/14 1748 03/28/14 0630  NA 141 142 145  K 3.5* 4.2 4.0  CL 92* 95* 98  CO2 38* 40* 41*  GLUCOSE 247* 152* 138*  BUN 33* 32* 33*  CREATININE 1.15* 1.07 1.13*  CALCIUM 8.4 8.6 9.0  MG 1.9 2.0 2.2    Liver Function Tests:  Recent Labs Lab 03/23/14 1715  AST 26  ALT 24  ALKPHOS 93  BILITOT 0.9  PROT 7.2  ALBUMIN 3.3*    CBC:  Recent Labs Lab 03/26/14 0348 03/26/14 1815 03/27/14 0153 03/28/14 0630  WBC 10.6*  --  9.9 8.5  HGB 7.1* 7.5* 7.4* 7.3*  HCT 24.6* 26.6* 25.7* 25.7*  MCV 88.5  --  88.6 89.2  PLT 233  --  234 225    Cardiac Enzymes:  Recent Labs Lab 03/23/14 2133 03/24/14 0747 03/24/14 1400  TROPONINI <0.30 <0.30 <0.30    BNP:  Recent Labs  03/01/14 0256 03/23/14 1715 03/26/14 0348    PROBNP 5100.0* 2475.0* 6751.0*    Coagulation:  Recent Labs Lab 03/23/14 2133  INR 1.70*    Imaging: PORTABLE CHEST - 1 VIEW  COMPARISON: None.  FINDINGS: RIGHT arm PICC line tip projects over proximal SVC at the level of aortic arch.  Enlargement of cardiac silhouette with pulmonary vascular congestion.  Improved pulmonary edema.  No segmental consolidation, pleural effusion or pneumothorax.  IMPRESSION: Tip of RIGHT arm PICC line projects over proximal SVC at the level of the aortic arch.  Improved pulmonary edema.    Medications:   Scheduled Medications: . antiseptic oral rinse  15 mL Mouth Rinse q12n4p  . ezetimibe  10 mg Oral QHS   And  . atorvastatin  20 mg Oral QHS  . calcium carbonate (dosed in mg elemental calcium)  500 mg of elemental calcium Oral Q breakfast  . chlorhexidine  15 mL Mouth Rinse BID  . cholecalciferol  2,000 Units Oral Daily  . diltiazem  60 mg Oral 3 times per day  . furosemide  40 mg Intravenous BID  . insulin aspart  0-20 Units Subcutaneous TID WC  . insulin glargine  15 Units Subcutaneous BID  . ipratropium-albuterol  3 mL Nebulization QID  . levofloxacin  750 mg Oral Daily  . metoprolol  50 mg Oral BID  . pantoprazole  40 mg Oral QHS  . predniSONE  40 mg Oral Q breakfast  . sodium chloride  3 mL Intravenous Q12H  . vitamin B-12  500 mcg Oral Daily     Infusions: . amiodarone 30 mg/hr (03/28/14 0000)  . heparin 600 Units/hr (03/26/14 2100)     PRN Medications:  sodium chloride, sodium chloride, acetaminophen, albuterol, fentaNYL, sodium chloride, sodium chloride   Assessment:   1. Paroxysmal atrial fibrillation, status post repeat cardioversion on 03/27/14. On amiodarone, metoprolol, Cardizem, and heparin. Was on Pradaxa as an outpatient.  2. Acute on chronic diastolic heart failure, continues to diuresis on IV Lasix.  3. Respiratory failure with COPD exacerbation.  4. Chronic anemia, hemoglobin stable at 7.3.  Question etiology?  5. CKD, stage 2, creatinine 1.2.   Plan/Discussion:    Patient maintaining sinus rhythm. I reviewed her history. She had been on Coumadin in the past, Pradaxa since 2011. Does have long-standing history of anemia, etiology not clear to me, but reportedly tolerating anticoagulants - saw Dr. Aundra Dubin in April. Recent chest x-ray noted. Continue pulmonary management per primary team. Will change to oral amiodarone at 200 mg twice a day, continue Lopressor and Cardizem. She continues on heparin, eventually can transition back to an oral agent, but would discuss with Dr. Aundra Dubin first about best choice. Would guaiac stools and check iron studies.    Satira Sark, M.D., F.A.C.C.

## 2014-03-28 NOTE — Progress Notes (Signed)
PULMONARY / CRITICAL CARE MEDICINE   Name: Paige Hardin MRN: [id number] DOB: 18-Mar-1946    ADMISSION DATE:  03/23/2014 CONSULTATION DATE:  03/23/2014  REFERRING MD : ED  PRIMARY SERVICE: PCCM  CHIEF COMPLAINT: SOB  BRIEF PATIENT DESCRIPTION:  The patient is a 68 year old woman with morbid obesity, severe COPD, lung nodules, DM-II, chronic diastolic HF (EF 60 % on 04/27/08), paroxysmal atrial fibrillation and s/p of cardioversion, who presents with worsening SOB and respiratory failure. Likely due to combinational for CHF and COPD exacerbation. Intubated in ED.   SIGNIFICANT EVENTS / STUDIES:  ETT 4/27>>> 4/29  LINES / TUBES:  PIV 4/27>>>  CULTURES:  Blood culture 4/27>>> Sputum culture 4/27>>>  Tracheal aspirate 4/27>>  Resp virus tracheal aspirate 4/27 >>> neg Urine culture 4/27 >>> neg Urine legionella 4/30 > neg Urine strep 4/30 > neg  ANTIBIOTICS:  Vancomycin 4/27>>> 4/30 Cefpime 4/27>>> 4/30 levaquin 4/30 >>   INTERVAL HISTORY/subjective Sputum sltly bloody  VITAL SIGNS:   Temp:  [97.2 F (36.2 C)-98 F (36.7 C)] 97.2 F (36.2 C) (05/02 0416) Pulse Rate:  [57-121] 63 (05/02 0416) Resp:  [16-18] 18 (05/02 0416) BP: (105-142)/(42-94) 142/45 mmHg (05/02 0416) SpO2:  [91 %-98 %] 92 % (05/02 1226) Weight:  [285 lb 11.5 oz (129.6 kg)] 285 lb 11.5 oz (129.6 kg) (05/02 0416)  HEMODYNAMICS:   VENTILATOR SETTINGS:  N/A  INTAKE / OUTPUT: Intake/Output     05/01 0701 - 05/02 0700 05/02 0701 - 05/03 0700   P.O. 480 240   I.V. (mL/kg) 644.8 (5)    IV Piggyback     Total Intake(mL/kg) 1124.8 (8.7) 240 (1.9)   Urine (mL/kg/hr) 1650 (0.5)    Total Output 1650     Net -525.2 +240         PHYSICAL EXAMINATION: General:  Sitting up in bed in NAD, watching tv Neuro:  AAO, responding appropriately HEENT:  WNL Cardiovascular:  Irregularly irregular Lungs: decreased BS B/L Abdomen:  +BS, soft, NT, ND Musculoskeletal:  Able to move extremities  voluntarily Extremities:  1+ B/L lower extremity edema Skin:  Intact   LABS:  CBC  Recent Labs Lab 03/26/14 0348 03/26/14 1815 03/27/14 0153 03/28/14 0630  WBC 10.6*  --  9.9 8.5  HGB 7.1* 7.5* 7.4* 7.3*  HCT 24.6* 26.6* 25.7* 25.7*  PLT 233  --  234 225   Coag's  Recent Labs Lab 03/23/14 2133 03/26/14 0348 03/27/14 0153 03/28/14 0630  APTT  --  130* 29 40*  INR 1.70*  --   --   --    BMET  Recent Labs Lab 03/27/14 0153 03/27/14 1748 03/28/14 0630  NA 141 142 145  K 3.5* 4.2 4.0  CL 92* 95* 98  CO2 38* 40* 41*  BUN 33* 32* 33*  CREATININE 1.15* 1.07 1.13*  GLUCOSE 247* 152* 138*   Electrolytes  Recent Labs Lab 03/27/14 0153 03/27/14 1748 03/28/14 0630  CALCIUM 8.4 8.6 9.0  MG 1.9 2.0 2.2  PHOS 3.5 2.9 3.4   Sepsis Markers  Recent Labs Lab 03/23/14 2133 03/26/14 0348  LATICACIDVEN 0.9  --   PROCALCITON 0.40 0.87   ABG  Recent Labs Lab 03/25/14 0409 03/25/14 0613 03/26/14 0242  PHART 7.518* 7.443 7.492*  PCO2ART 48.1* 57.0* 55.8*  PO2ART 68.0* 79.0* 67.0*   Liver Enzymes  Recent Labs Lab 03/23/14 1715  AST 26  ALT 24  ALKPHOS 93  BILITOT 0.9  ALBUMIN 3.3*   Cardiac Enzymes  Recent  Labs Lab 03/23/14 1715 03/23/14 2133 03/24/14 0747 03/24/14 1400 03/26/14 0348  TROPONINI  --  <0.30 <0.30 <0.30  --   PROBNP 2475.0*  --   --   --  6751.0*   Glucose  Recent Labs Lab 03/27/14 0625 03/27/14 1137 03/27/14 1658 03/27/14 2137 03/28/14 0635 03/28/14 1104  GLUCAP 187* 188* 142* 197* 141* 193*    Imaging Dg Chest Port 1 View  03/27/2014   CLINICAL DATA:  Line placement portable exam 0901 hr compared to 03/26/2014  EXAM: PORTABLE CHEST - 1 VIEW  COMPARISON:  None.  FINDINGS: RIGHT arm PICC line tip projects over proximal SVC at the level of aortic arch.  Enlargement of cardiac silhouette with pulmonary vascular congestion.  Improved pulmonary edema.  No segmental consolidation, pleural effusion or pneumothorax.   IMPRESSION: Tip of RIGHT arm PICC line projects over proximal SVC at the level of the aortic arch.  Improved pulmonary edema.   Electronically Signed   By: Lavonia Dana M.D.   On: 03/27/2014 09:24   CXR: 04/30 increased B/L opacities  ASSESSMENT / PLAN:  PULMONARY  A:  - Acute hypercapnic respiratory failure 2/2 to acute exacerbation of dHF (EF 60% TTE 02/15) with AECOPD likely contributing; extubated 04/29 - PAH, 53 mmHg  P:  - Decreased Lasix 40 mg BID  - Albuterol neb q2h prn - Duoneb qid - IV Solu-medrol 60mg  q8h > changed to pred 40mg  on 4/30 and then taper over the next few days. - Probably needs an outpt sleep study, sees Dr Gwenette Greet  CARDIOVASCULAR  A:  - Hx of PAF (s/p of cardioversion), afib since 04/29 AM - cardiology evaluated the patient and switched po amio to gtt.  Plan for EP to evaluate today for possible AV node ablation and PPM.  Initially RVR but HR controlled this AM;  - Acute on chronic dHF--> clinically volume overloaded at admission, proBNP 2475 --> 6751.0; net -6.6L; wt even P:  - EP >> possible AV node ablation/PPM  - Continue home medications for Afib including amiodarone (gtt), metoprolol, and Cardizem > note prolonged QTc - Aggressive diuresis with Lasix. - Follow up BMP/Mg/Phos BID, replete electrolytes as needed  - Holding home Pradaxa; Lovenox --> heparin gtt (in case anticoag needs to be halted for procedure) - VTE ppx - SCDs and heparin gtt  RENAL  A:  - CKD3, Cr stable - Hyperkalemia, resolved P:  - Decrease Lasix 40 mg BID  - Monitor BMP   GASTROINTESTINAL  A:  - GERD  P:  - on PO PPI, she was on prevacid at home - Advance to heart healthy diet.  HEMATOLOGIC  A:  - Leukocytosis, resolved   - Chronic anemia, baseline 7-9;    Recent Labs Lab 03/26/14 1815 03/27/14 0153 03/28/14 0630  HGB 7.5* 7.4* 7.3*    P:  - Monitor for s/s of infection or bleeding - Monitor CBC   INFECTIOUS  A:  - Leukocytosis, productive cough with  [place] sputum, no fever or chills; lactic acid 0.9 reassuring, PCT 0.4-->0.87 P:  - Abx > see dashboard  ENDOCRINE  A:  - DM type 2, CBGs > 200, steroid likely contributing P:  - SSI resistant q4h, Lantus   - Hold PO DM regimen for now - Monitor CBG  NEUROLOGIC  A:  - Acute hypoxic encephalopathy 2/2 to acute respiratory failure, resolved  P:  - Monitor for change in neuro status   Christinia Gully, MD Pulmonary and [facility] Cell [phone] After 5:30 PM or weekends, call [phone]

## 2014-03-28 NOTE — Progress Notes (Deleted)
ANTICOAGULATION CONSULT NOTE - Follow Up Consult  Pharmacy Consult:  Heparin Indication: atrial fibrillation  Allergies  Allergen Reactions  . Ace Inhibitors Cough  . Clindamycin Rash    Patient Measurements: Height: 5\' 5"  (165.1 cm) Weight: 285 lb 11.5 oz (129.6 kg) (pt wanted to be weighed in bed) IBW/kg (Calculated) : 57 Heparin Dosing Weight: 91 kg  Vital Signs: Temp: 97.6 F (36.4 C) (05/02 1449) Temp src: Oral (05/02 1449) BP: 142/57 mmHg (05/02 1449) Pulse Rate: 71 (05/02 1449)  Labs:  Recent Labs  03/26/14 0348  03/26/14 1815 03/27/14 0153 03/27/14 1000 03/27/14 1748 03/28/14 0630 03/28/14 1825  HGB 7.1*  --  7.5* 7.4*  --   --  7.3*  --   HCT 24.6*  --  26.6* 25.7*  --   --  25.7*  --   PLT 233  --   --  234  --   --  225  --   APTT 130*  --   --  29  --   --  40*  --   HEPARINUNFRC 1.58*  < > 0.84* 0.46 0.36  --  0.14* 0.17*  CREATININE 1.05  --  1.23* 1.15*  --  1.07 1.13*  --   < > = values in this interval not displayed.  Estimated Creatinine Clearance: 64.7 ml/min (by C-G formula based on Cr of 1.13).   Assessment: 49 YOF to continue on IV heparin for AFib.  Heparin level is subtherapeutic at 0.17 after increase to  900 units/hr. Patient noted with hemoptysis yesterday but none today.    Goal of Therapy:  Heparin level 0.3-0.7 units/mL Monitor platelets by anticoagulation protocol: Yes   Plan:  -Increase heparin to 1200 units/hr -Heparin level in am  Hildred Laser, Pharm D 03/28/2014 7:22 PM

## 2014-03-28 NOTE — Progress Notes (Addendum)
ANTICOAGULATION CONSULT NOTE - Follow Up Consult  Pharmacy Consult:  Heparin Indication: atrial fibrillation  Allergies  Allergen Reactions  . Ace Inhibitors Cough  . Clindamycin Rash    Patient Measurements: Height: 5\' 5"  (165.1 cm) Weight: 285 lb 11.5 oz (129.6 kg) (pt wanted to be weighed in bed) IBW/kg (Calculated) : 57 Heparin Dosing Weight: 91 kg  Vital Signs: Temp: 97.2 F (36.2 C) (05/02 0416) Temp src: Oral (05/02 0416) BP: 142/45 mmHg (05/02 0416) Pulse Rate: 63 (05/02 0416)  Labs:  Recent Labs  03/26/14 0348  03/26/14 1815 03/27/14 0153 03/27/14 1000 03/27/14 1748 03/28/14 0630  HGB 7.1*  --  7.5* 7.4*  --   --  7.3*  HCT 24.6*  --  26.6* 25.7*  --   --  25.7*  PLT 233  --   --  234  --   --  225  APTT 130*  --   --  29  --   --  40*  HEPARINUNFRC 1.58*  < > 0.84* 0.46 0.36  --  0.14*  CREATININE 1.05  --  1.23* 1.15*  --  1.07 1.13*  < > = values in this interval not displayed.  Estimated Creatinine Clearance: 64.7 ml/min (by C-G formula based on Cr of 1.13).   Assessment: 72 YOF to continue on IV heparin for AFib.  Heparin level is subtherapeutic at 0.14 on 600 units/hr. No bleeding reported, Hb low at 7.3 (stable), platelets are normal. PPM not placed as of yet. Spoke with RN who reports patient had hemoptysis yesterday but none today and there have been no problems with infusion.  Goal of Therapy:  Heparin level 0.3-0.7 units/mL Monitor platelets by anticoagulation protocol: Yes   Plan:  - Increase heparin drip to 900 units/hr - no bolus due to low Hb - 6 hr HL - Daily HL / CBC - Follow-up plans to change back to oral therapy   [facility], [place].D., BCPS Clinical Pharmacist Pager: [phone] 03/28/2014 11:46 AM   Addendum: Heparin level still subtherapeutic ~ 5.5 hrs after rate increase. No interruption with infusion, no bleeding noted per RN.  Plan: - increase heparin rate to 1100 units/hr - f/u heparin level in AM

## 2014-03-29 DIAGNOSIS — Z7901 Long term (current) use of anticoagulants: Secondary | ICD-10-CM

## 2014-03-29 DIAGNOSIS — D509 Iron deficiency anemia, unspecified: Secondary | ICD-10-CM

## 2014-03-29 LAB — PHOSPHORUS: Phosphorus: 3.3 mg/dL (ref 2.3–4.6)

## 2014-03-29 LAB — CBC
HEMATOCRIT: 24.4 % — AB (ref 36.0–46.0)
HEMOGLOBIN: 6.9 g/dL — AB (ref 12.0–15.0)
MCH: 24.9 pg — AB (ref 26.0–34.0)
MCHC: 28.3 g/dL — AB (ref 30.0–36.0)
MCV: 88.1 fL (ref 78.0–100.0)
Platelets: 193 10*3/uL (ref 150–400)
RBC: 2.77 MIL/uL — ABNORMAL LOW (ref 3.87–5.11)
RDW: 17.6 % — ABNORMAL HIGH (ref 11.5–15.5)
WBC: 8.2 10*3/uL (ref 4.0–10.5)

## 2014-03-29 LAB — PROTIME-INR
INR: 1.21 (ref 0.00–1.49)
PROTHROMBIN TIME: 15 s (ref 11.6–15.2)

## 2014-03-29 LAB — FIBRINOGEN: Fibrinogen: 329 mg/dL (ref 204–475)

## 2014-03-29 LAB — GLUCOSE, CAPILLARY
Glucose-Capillary: 107 mg/dL — ABNORMAL HIGH (ref 70–99)
Glucose-Capillary: 131 mg/dL — ABNORMAL HIGH (ref 70–99)
Glucose-Capillary: 151 mg/dL — ABNORMAL HIGH (ref 70–99)
Glucose-Capillary: 172 mg/dL — ABNORMAL HIGH (ref 70–99)

## 2014-03-29 LAB — BASIC METABOLIC PANEL
BUN: 30 mg/dL — AB (ref 6–23)
CALCIUM: 9 mg/dL (ref 8.4–10.5)
CO2: 39 meq/L — AB (ref 19–32)
CREATININE: 1.07 mg/dL (ref 0.50–1.10)
Chloride: 96 mEq/L (ref 96–112)
GFR calc Af Amer: 60 mL/min — ABNORMAL LOW (ref >90)
GFR calc non Af Amer: 52 mL/min — ABNORMAL LOW (ref >90)
GLUCOSE: 116 mg/dL — AB (ref 70–99)
Potassium: 3.9 mEq/L (ref 3.7–5.3)
Sodium: 143 mEq/L (ref 137–147)

## 2014-03-29 LAB — PREPARE RBC (CROSSMATCH)

## 2014-03-29 LAB — APTT: aPTT: 66 seconds — ABNORMAL HIGH (ref 24–37)

## 2014-03-29 LAB — HEPARIN LEVEL (UNFRACTIONATED): Heparin Unfractionated: 0.25 IU/mL — ABNORMAL LOW (ref 0.30–0.70)

## 2014-03-29 LAB — MAGNESIUM: Magnesium: 2.1 mg/dL (ref 1.5–2.5)

## 2014-03-29 MED ORDER — FUROSEMIDE 10 MG/ML IJ SOLN
40.0000 mg | Freq: Once | INTRAMUSCULAR | Status: AC
  Administered 2014-03-29: 40 mg via INTRAVENOUS

## 2014-03-29 MED ORDER — PANTOPRAZOLE SODIUM 40 MG PO TBEC
40.0000 mg | DELAYED_RELEASE_TABLET | Freq: Two times a day (BID) | ORAL | Status: DC
  Administered 2014-03-29 – 2014-04-03 (×9): 40 mg via ORAL
  Filled 2014-03-29 (×10): qty 1

## 2014-03-29 MED ORDER — PREDNISONE 20 MG PO TABS
20.0000 mg | ORAL_TABLET | Freq: Every day | ORAL | Status: DC
  Administered 2014-03-30 – 2014-04-01 (×3): 20 mg via ORAL
  Filled 2014-03-29 (×4): qty 1

## 2014-03-29 NOTE — Progress Notes (Addendum)
PULMONARY / CRITICAL CARE MEDICINE   Name: Paige Hardin MRN: [id number] DOB: 08-24-1946    ADMISSION DATE:  03/23/2014 CONSULTATION DATE:  03/23/2014  REFERRING MD : ED  PRIMARY SERVICE: PCCM  CHIEF COMPLAINT: SOB  BRIEF PATIENT DESCRIPTION:    68 year old woman with morbid obesity, severe COPD, lung nodules, DM-II, chronic diastolic HF (EF 60 % on 8/46/96), paroxysmal atrial fibrillation and s/p of cardioversion, who presents with worsening SOB and respiratory failure. Likely due to combinational for CHF and COPD exacerbation. Intubated in ED.   SIGNIFICANT EVENTS / STUDIES:  ETT 4/27>>> 4/29  LINES / TUBES:  PIV 4/27>>>  CULTURES:  Blood culture 4/27> neg Sputum culture 4/27>>>  Tracheal aspirate 4/27>> neg Resp virus tracheal aspirate 4/27 >>> neg Urine culture 4/27 >>> neg Urine legionella 4/30 > neg Urine strep 4/30 > neg  ANTIBIOTICS:  Vancomycin 4/27>>> 4/30 Cefpime 4/27>>> 4/30 levaquin 4/30 >>   INTERVAL HISTORY/subjective Nad up in chair on 2lpm NP - no obvious blood loss/ bruising hx   VITAL SIGNS:   Temp:  [97.4 F (36.3 C)-98 F (36.7 C)] 97.7 F (36.5 C) (05/03 1320) Pulse Rate:  [60-108] 62 (05/03 1320) Resp:  [16-20] 18 (05/03 1320) BP: (121-142)/(43-57) 131/46 mmHg (05/03 1320) SpO2:  [90 %-99 %] 95 % (05/03 1320) Weight:  [283 lb 1.1 oz (128.4 kg)] 283 lb 1.1 oz (128.4 kg) (05/03 0440)  HEMODYNAMICS:   VENTILATOR SETTINGS:  N/A  INTAKE / OUTPUT: Intake/Output     05/02 0701 - 05/03 0700 05/03 0701 - 05/04 0700   P.O. 780 240   I.V. (mL/kg)     Blood  312.5   Total Intake(mL/kg) 780 (6.1) 552.5 (4.3)   Urine (mL/kg/hr) 2450 (0.8) 400 (0.5)   Total Output 2450 400   Net -1670 +152.5         PHYSICAL EXAMINATION: General:  Sitting up in chair NAD, watching tv Neuro:  AAO, responding appropriately HEENT:  WNL Cardiovascular:  Irregularly irregular Lungs: decreased BS B/L Abdomen:  +BS, soft, NT, ND Musculoskeletal:  Able to move  extremities voluntarily Extremities:  1+ B/L lower extremity edema Skin:  Intact   LABS:  CBC  Recent Labs Lab 03/27/14 0153 03/28/14 0630 03/29/14 0502  WBC 9.9 8.5 8.2  HGB 7.4* 7.3* 6.9*  HCT 25.7* 25.7* 24.4*  PLT 234 225 193   Coag's  Recent Labs Lab 03/23/14 2133  03/27/14 0153 03/28/14 0630 03/29/14 0502 03/29/14 0907  APTT  --   < > 29 40* 66*  --   INR 1.70*  --   --   --   --  1.21  < > = values in this interval not displayed. BMET  Recent Labs Lab 03/28/14 0630 03/28/14 2123 03/29/14 0502  NA 145 138 143  K 4.0 4.1 3.9  CL 98 92* 96  CO2 41* 39* 39*  BUN 33* 34* 30*  CREATININE 1.13* 1.17* 1.07  GLUCOSE 138* 218* 116*   Electrolytes  Recent Labs Lab 03/28/14 0630 03/28/14 2123 03/29/14 0502  CALCIUM 9.0 8.7 9.0  MG 2.2 2.0 2.1  PHOS 3.4 3.6 3.3   Sepsis Markers  Recent Labs Lab 03/23/14 2133 03/26/14 0348  LATICACIDVEN 0.9  --   PROCALCITON 0.40 0.87   ABG  Recent Labs Lab 03/25/14 0409 03/25/14 0613 03/26/14 0242  PHART 7.518* 7.443 7.492*  PCO2ART 48.1* 57.0* 55.8*  PO2ART 68.0* 79.0* 67.0*   Liver Enzymes  Recent Labs Lab 03/23/14 1715  AST  26  ALT 24  ALKPHOS 93  BILITOT 0.9  ALBUMIN 3.3*   Cardiac Enzymes  Recent Labs Lab 03/23/14 1715 03/23/14 2133 03/24/14 0747 03/24/14 1400 03/26/14 0348  TROPONINI  --  <0.30 <0.30 <0.30  --   PROBNP 2475.0*  --   --   --  6751.0*   Glucose  Recent Labs Lab 03/28/14 0635 03/28/14 1104 03/28/14 1625 03/28/14 2049 03/29/14 0537 03/29/14 1114  GLUCAP 141* 193* 261* 228* 107* 131*    Imaging No results found. CXR: 5/1 Tip of RIGHT arm PICC line projects over proximal SVC at the level  of the aortic arch.  Improved pulmonary edema.    ASSESSMENT / PLAN:  PULMONARY  A:  - Acute hypercapnic respiratory failure 2/2 to acute exacerbation of dHF (EF 60% TTE 02/15) with AECOPD likely contributing; extubated 04/29 - PAH, 53 mmHg  P:  - Decreased Lasix  40 mg BID  - Albuterol neb q2h prn - Duoneb qid - IV Solu-medrol 60mg  q8h > changed to pred 40mg  on 4/30 and then tapered to 20 mg daily on 5/3    - Probably needs an outpt sleep f/u >  Dr Gwenette Greet  CARDIOVASCULAR  A:  - Hx of PAF (s/p of cardioversion), afib since 04/29 AM - cardiology evaluated the patient and switched po amio to gtt.  Plan for EP to evaluate further  - Acute on chronic dHF--> clinically volume overloaded at admission, proBNP 2475 --> 6751.0; net -6.6L; wt even P:  - EP >> possible AV node ablation/PPM  - Continue home medications for Afib including amiodarone (gtt), metoprolol, and Cardizem > note prolonged QTc - Aggressive diuresis with Lasix. - Follow up BMP/Mg/Phos BID, replete electrolytes as needed  - Holding home Pradaxa; Lovenox --> heparin gtt (in case anticoag needs to be halted for procedure) - VTE ppx - SCDs and heparin gtt  RENAL  A:  - CKD3, Cr stable - Hyperkalemia, resolved P:  - Decrease Lasix 40 mg BID  - Monitor BMP   GASTROINTESTINAL  A:  - GERD  P:  - on PO PPI, she was on prevacid at home - Advance to heart healthy diet.  HEMATOLOGIC  A:  - Leukocytosis, resolved   - Chronic anemia, baseline 7-9;    Recent Labs Lab 03/27/14 0153 03/28/14 0630 03/29/14 0502  HGB 7.4* 7.3* 6.9*    P:  -  No active bleeding, no symptoms related to low hbb so monitor but max rx with ppi started 5/3   INFECTIOUS  A:  - Leukocytosis, productive cough with Ansell sputum, no fever or chills; lactic acid 0.9 reassuring, PCT 0.4-->0.87 P:  - Abx > see dashboard  ENDOCRINE  A:  - DM type 2, CBGs > 200, steroid likely contributing P:  - SSI resistant q4h, Lantus   - Hold PO DM regimen for now - Monitor CBG  NEUROLOGIC  A:  - Acute hypoxic encephalopathy 2/2 to acute respiratory failure, resolved  P:  - Monitor for change in neuro status   Christinia Gully, MD Pulmonary and [place] [phone] After 5:30  PM or weekends, call [phone]

## 2014-03-29 NOTE — Progress Notes (Signed)
Primary cardiologist: Dr. Loralie Champagne  Subjective:   Short of breath this morning after getting up to the chair. No chest pain.   Objective:   Temp:  [97.6 F (36.4 C)-97.9 F (36.6 C)] 97.9 F (36.6 C) (05/03 0440) Pulse Rate:  [68-76] 69 (05/03 0440) Resp:  [18-20] 18 (05/03 0440) BP: (132-142)/(46-57) 139/49 mmHg (05/03 0440) SpO2:  [91 %-99 %] 99 % (05/03 0440) Weight:  [283 lb 1.1 oz (128.4 kg)] 283 lb 1.1 oz (128.4 kg) (05/03 0440) Last BM Date: 03/27/14  Filed Weights   03/27/14 0544 03/28/14 0416 03/29/14 0440  Weight: 289 lb 11 oz (131.4 kg) 285 lb 11.5 oz (129.6 kg) 283 lb 1.1 oz (128.4 kg)    Intake/Output Summary (Last 24 hours) at 03/29/14 0740 Last data filed at 03/29/14 0444  Gross per 24 hour  Intake    480 ml  Output   2450 ml  Net  -1970 ml    Telemetry: Sinus rhythm, rare PVC.  Exam:  General: Obese, chronically ill-appearing.  Lungs: Coarse breath sounds, no wheezing.  Cardiac: Regular rate and rhythm, no gallop.  Abdomen: Obese.  Extremities: Chronic appearing edema.   Lab Results:  Basic Metabolic Panel:  Recent Labs Lab 03/28/14 0630 03/28/14 2123 03/29/14 0502  NA 145 138 143  K 4.0 4.1 3.9  CL 98 92* 96  CO2 41* 39* 39*  GLUCOSE 138* 218* 116*  BUN 33* 34* 30*  CREATININE 1.13* 1.17* 1.07  CALCIUM 9.0 8.7 9.0  MG 2.2 2.0 2.1    Liver Function Tests:  Recent Labs Lab 03/23/14 1715  AST 26  ALT 24  ALKPHOS 93  BILITOT 0.9  PROT 7.2  ALBUMIN 3.3*    CBC:  Recent Labs Lab 03/27/14 0153 03/28/14 0630 03/29/14 0502  WBC 9.9 8.5 8.2  HGB 7.4* 7.3* 6.9*  HCT 25.7* 25.7* 24.4*  MCV 88.6 89.2 88.1  PLT 234 225 193    Cardiac Enzymes:  Recent Labs Lab 03/23/14 2133 03/24/14 0747 03/24/14 1400  TROPONINI <0.30 <0.30 <0.30    BNP:  Recent Labs  03/01/14 0256 03/23/14 1715 03/26/14 0348  PROBNP 5100.0* 2475.0* 6751.0*    Coagulation:  Recent Labs Lab 03/23/14 2133  INR 1.70*     Imaging: PORTABLE CHEST - 1 VIEW  COMPARISON: None.  FINDINGS: RIGHT arm PICC line tip projects over proximal SVC at the level of aortic arch.  Enlargement of cardiac silhouette with pulmonary vascular congestion.  Improved pulmonary edema.  No segmental consolidation, pleural effusion or pneumothorax.  IMPRESSION: Tip of RIGHT arm PICC line projects over proximal SVC at the level of the aortic arch.  Improved pulmonary edema.   Medications:   Scheduled Medications: . amiodarone  200 mg Oral BID  . antiseptic oral rinse  15 mL Mouth Rinse q12n4p  . ezetimibe  10 mg Oral QHS   And  . atorvastatin  20 mg Oral QHS  . calcium carbonate (dosed in mg elemental calcium)  500 mg of elemental calcium Oral Q breakfast  . chlorhexidine  15 mL Mouth Rinse BID  . cholecalciferol  2,000 Units Oral Daily  . diltiazem  60 mg Oral 3 times per day  . furosemide  40 mg Intravenous BID  . insulin aspart  0-20 Units Subcutaneous TID WC  . insulin glargine  15 Units Subcutaneous BID  . ipratropium-albuterol  3 mL Nebulization QID  . levofloxacin  750 mg Oral Daily  . metoprolol  50 mg  Oral BID  . pantoprazole  40 mg Oral QHS  . predniSONE  40 mg Oral Q breakfast  . sodium chloride  3 mL Intravenous Q12H  . vitamin B-12  500 mcg Oral Daily    Infusions: . heparin 1,200 Units/hr (03/29/14 0656)    PRN Medications: sodium chloride, sodium chloride, acetaminophen, albuterol, fentaNYL, sodium chloride, sodium chloride   Assessment:   1. Paroxysmal atrial fibrillation, status post repeat cardioversion on 03/27/14. On amiodarone, metoprolol, Cardizem, and heparin. Was on Pradaxa as an outpatient.  2. Acute on chronic diastolic heart failure, continues to diurese on IV Lasix.  3. Respiratory failure with COPD exacerbation. Pulmonary following.  4. Acute on chronic anemia, hemoglobin down from 7.3 to 6.9. Question etiology? Iron deficiency. Guaiac stools.  5. CKD, stage 2,  creatinine 1.0.   Plan/Discussion:    Patient maintaining sinus rhythm. She was switched to oral amiodarone yesterday. Continues on heparin in light of recent cardioversion, however her hemoglobin is trending down, chronically low at baseline. Anemia studies suggest iron deficiency, stools pending for guaiac assessment. Would transfuse 2 units PRBCs with Lasix between. Will likely need formal GI workup if not already completed previously. Reluctant to resume any oral anticoagulants as yet.  Satira Sark, M.D., F.A.C.C.

## 2014-03-29 NOTE — Progress Notes (Signed)
ANTICOAGULATION CONSULT NOTE - Follow Up Consult  Pharmacy Consult for Heparin  Indication: atrial fibrillation  Allergies  Allergen Reactions  . Ace Inhibitors Cough  . Clindamycin Rash    Patient Measurements: Height: 5\' 5"  (165.1 cm) Weight: 283 lb 1.1 oz (128.4 kg) IBW/kg (Calculated) : 57 Heparin Dosing Weight: 91 kg  Vital Signs: Temp: 97.9 F (36.6 C) (05/03 0440) Temp src: Oral (05/03 0440) BP: 139/49 mmHg (05/03 0440) Pulse Rate: 69 (05/03 0440)  Labs:  Recent Labs  03/27/14 0153  03/28/14 0630 03/28/14 1825 03/28/14 2123 03/29/14 0502  HGB 7.4*  --  7.3*  --   --  6.9*  HCT 25.7*  --  25.7*  --   --  24.4*  PLT 234  --  225  --   --  193  APTT 29  --  40*  --   --  66*  HEPARINUNFRC 0.46  < > 0.14* 0.17*  --  0.25*  CREATININE 1.15*  < > 1.13*  --  1.17* 1.07  < > = values in this interval not displayed.  Estimated Creatinine Clearance: 68 ml/min (by C-G formula based on Cr of 1.07).  Medications:  Heparin 1100 units/hr  Assessment: 68 y/o F with sub-therapeutic HL. HL is 0.25. Hgb is 6.9 this AM (but it has been ~7 for several days and pt is noted to have chronic anemia). Other labs as above.   Goal of Therapy:  Heparin level 0.3-0.7 units/ml Monitor platelets by anticoagulation protocol: Yes   Plan:  -Increase heparin slightly to 1200 units/hr -Confirmatory HL at 1400 -Daily CBC/HL -Monitor for bleeding, trend Hgb  Narda Bonds 03/29/2014,6:42 AM

## 2014-03-30 ENCOUNTER — Inpatient Hospital Stay (HOSPITAL_COMMUNITY): Payer: Medicare Other

## 2014-03-30 DIAGNOSIS — R042 Hemoptysis: Secondary | ICD-10-CM

## 2014-03-30 LAB — TYPE AND SCREEN
ABO/RH(D): A POS
Antibody Screen: NEGATIVE
UNIT DIVISION: 0
Unit division: 0

## 2014-03-30 LAB — GLUCOSE, CAPILLARY
GLUCOSE-CAPILLARY: 67 mg/dL — AB (ref 70–99)
Glucose-Capillary: 103 mg/dL — ABNORMAL HIGH (ref 70–99)
Glucose-Capillary: 171 mg/dL — ABNORMAL HIGH (ref 70–99)
Glucose-Capillary: 193 mg/dL — ABNORMAL HIGH (ref 70–99)

## 2014-03-30 LAB — CBC
HCT: 29.4 % — ABNORMAL LOW (ref 36.0–46.0)
HEMOGLOBIN: 8.7 g/dL — AB (ref 12.0–15.0)
MCH: 25.4 pg — ABNORMAL LOW (ref 26.0–34.0)
MCHC: 29.6 g/dL — ABNORMAL LOW (ref 30.0–36.0)
MCV: 86 fL (ref 78.0–100.0)
Platelets: 198 10*3/uL (ref 150–400)
RBC: 3.42 MIL/uL — ABNORMAL LOW (ref 3.87–5.11)
RDW: 17.2 % — ABNORMAL HIGH (ref 11.5–15.5)
WBC: 9.6 10*3/uL (ref 4.0–10.5)

## 2014-03-30 LAB — PHOSPHORUS
PHOSPHORUS: 3.4 mg/dL (ref 2.3–4.6)
Phosphorus: 3.1 mg/dL (ref 2.3–4.6)

## 2014-03-30 LAB — BASIC METABOLIC PANEL
BUN: 24 mg/dL — ABNORMAL HIGH (ref 6–23)
BUN: 26 mg/dL — ABNORMAL HIGH (ref 6–23)
CHLORIDE: 91 meq/L — AB (ref 96–112)
CO2: 38 meq/L — AB (ref 19–32)
CO2: 38 meq/L — AB (ref 19–32)
CREATININE: 1.03 mg/dL (ref 0.50–1.10)
CREATININE: 1.05 mg/dL (ref 0.50–1.10)
Calcium: 8.9 mg/dL (ref 8.4–10.5)
Calcium: 9.2 mg/dL (ref 8.4–10.5)
Chloride: 95 mEq/L — ABNORMAL LOW (ref 96–112)
GFR calc Af Amer: 63 mL/min — ABNORMAL LOW (ref >90)
GFR calc non Af Amer: 55 mL/min — ABNORMAL LOW (ref >90)
GFR calc non Af Amer: 53 mL/min — ABNORMAL LOW (ref >90)
GFR, EST AFRICAN AMERICAN: 62 mL/min — AB (ref >90)
Glucose, Bld: 75 mg/dL (ref 70–99)
Glucose, Bld: 191 mg/dL — ABNORMAL HIGH (ref 70–99)
Potassium: 3.4 mEq/L — ABNORMAL LOW (ref 3.7–5.3)
Potassium: 3.8 mEq/L (ref 3.7–5.3)
SODIUM: 142 meq/L (ref 137–147)
Sodium: 138 mEq/L (ref 137–147)

## 2014-03-30 LAB — CULTURE, BLOOD (ROUTINE X 2)
CULTURE: NO GROWTH
Culture: NO GROWTH

## 2014-03-30 LAB — MAGNESIUM
MAGNESIUM: 2 mg/dL (ref 1.5–2.5)
MAGNESIUM: 1.8 mg/dL (ref 1.5–2.5)

## 2014-03-30 LAB — HEPARIN LEVEL (UNFRACTIONATED)
HEPARIN UNFRACTIONATED: 0.3 [IU]/mL (ref 0.30–0.70)
Heparin Unfractionated: 0.38 IU/mL (ref 0.30–0.70)

## 2014-03-30 LAB — TRIGLYCERIDES: Triglycerides: 67 mg/dL (ref <150)

## 2014-03-30 MED ORDER — POTASSIUM CHLORIDE CRYS ER 20 MEQ PO TBCR
40.0000 meq | EXTENDED_RELEASE_TABLET | Freq: Once | ORAL | Status: AC
  Administered 2014-03-30: 40 meq via ORAL
  Filled 2014-03-30: qty 2

## 2014-03-30 MED ORDER — POTASSIUM CHLORIDE CRYS ER 20 MEQ PO TBCR
20.0000 meq | EXTENDED_RELEASE_TABLET | Freq: Every day | ORAL | Status: DC
Start: 2014-03-31 — End: 2014-04-01
  Administered 2014-03-31 – 2014-04-01 (×2): 20 meq via ORAL
  Filled 2014-03-30 (×2): qty 1

## 2014-03-30 MED ORDER — DILTIAZEM HCL ER COATED BEADS 180 MG PO CP24
180.0000 mg | ORAL_CAPSULE | Freq: Every day | ORAL | Status: DC
  Administered 2014-03-30 – 2014-04-03 (×5): 180 mg via ORAL
  Filled 2014-03-30 (×5): qty 1

## 2014-03-30 NOTE — Progress Notes (Signed)
Utilization Review Completed Malissie Musgrave J. Akiah Bauch, RN, BSN, NCM [phone]  

## 2014-03-30 NOTE — Progress Notes (Signed)
Physical Therapy Treatment Patient Details Name: Paige Hardin MRN: [id number] DOB: [birth year] Today's Date: 03/30/2014    History of Present Illness 68 year old woman with morbid obesity, severe COPD, lung nodules, DM-II, chronic diastolic HF (EF 60 % on 03/19/52), paroxysmal atrial fibrillation and s/p of cardioversion, who presents with worsening SOB and respiratory failure. Likely due to combinational for CHF and COPD exacerbation. Intubated in ED.  Pt extubated 4/29.    PT Comments    Pt progressing towards physical therapy goals. Pt reports that she had been sitting up all morning and was tired, however was willing to ambulate with PT. Pt was able to ambulate a total of 15 feet with seated rest breaks. Close chair follow required. Will continue to progress per POC.   Follow Up Recommendations  SNF     Equipment Recommendations  None recommended by PT    Recommendations for Other Services       Precautions / Restrictions Precautions Precautions: Fall Restrictions Weight Bearing Restrictions: No    Mobility  Bed Mobility Overal bed mobility: Needs Assistance Bed Mobility: Supine to Sit;Sit to Supine     Supine to sit: Min assist;HOB elevated Sit to supine: Min assist   General bed mobility comments: Assist to bring trunk up into sitting and to bring legs back up into bed.  Transfers Overall transfer level: Needs assistance Equipment used: Rolling walker (2 wheeled) Transfers: Sit to/from Stand Sit to Stand: Min guard         General transfer comment: Pt able to power-up to full standing position with min guard assist. VC's for hand placement on seated surface for safety.   Ambulation/Gait Ambulation/Gait assistance: Min guard Ambulation Distance (Feet): 15 Feet Assistive device: Rolling walker (2 wheeled) Gait Pattern/deviations: Step-through pattern;Decreased stride length;Shuffle;Trunk flexed Gait velocity: Decreased Gait velocity interpretation: Below normal  speed for age/gender General Gait Details: Pt able to ambulate increased distance this session. Required a rest break after 10 feet and then another 5 feet later. At this point, ambulation was ended.    Stairs            Wheelchair Mobility    Modified Rankin (Stroke Patients Only)       Balance Overall balance assessment: Needs assistance Sitting-balance support: Feet supported;No upper extremity supported Sitting balance-Leahy Scale: Good     Standing balance support: Bilateral upper extremity supported Standing balance-Leahy Scale: Poor                      Cognition Arousal/Alertness: Awake/alert Behavior During Therapy: WFL for tasks assessed/performed Overall Cognitive Status: Within Functional Limits for tasks assessed                      Exercises      General Comments        Pertinent Vitals/Pain Pt on 3L/min supplemental O2 throughout session. Saturations at 93% at rest. When ambulating, sats dropped into mid to low 80's. Supplemental O2 increased to 4L/min. Sats remained in high 80's. When pt got back to bed, sats improved to 92% back on 3L/min O2.     Home Living                      Prior Function            PT Goals (current goals can now be found in the care plan section) Acute Rehab PT Goals Patient Stated Goal: None stated.  PT  Goal Formulation: With patient Time For Goal Achievement: 04/02/14 Potential to Achieve Goals: Good Progress towards PT goals: Progressing toward goals    Frequency  Min 2X/week    PT Plan Current plan remains appropriate    Co-evaluation             End of Session Equipment Utilized During Treatment: Gait belt;Oxygen Activity Tolerance: Patient tolerated treatment well Patient left: in bed;with call bell/phone within reach     Time: 1415-1442 PT Time Calculation (min): 27 min  Charges:  $Gait Training: 8-22 mins $Therapeutic Exercise: 8-22 mins                    G  Codes:      Jolyn Lent Apr 17, 2014, 3:22 PM  Jolyn Lent, PT, DPT Acute Rehabilitation Services Pager: [phone]

## 2014-03-30 NOTE — Progress Notes (Signed)
[place] for Heparin  Indication: atrial fibrillation  Allergies  Allergen Reactions  . Ace Inhibitors Cough  . Clindamycin Rash    Patient Measurements: Height: 5\' 5"  (165.1 cm) Weight: 283 lb 1.1 oz (128.4 kg) IBW/kg (Calculated) : 57 Heparin Dosing Weight: 91 kg  Vital Signs: Temp: 97.7 F (36.5 C) (05/03 2040) Temp src: Oral (05/03 2040) BP: 141/50 mmHg (05/03 2040) Pulse Rate: 67 (05/03 2040)  Labs:  Recent Labs  03/27/14 0153  03/28/14 0630 03/28/14 1825 03/28/14 2123 03/29/14 0502 03/29/14 0907 03/29/14 2359  HGB 7.4*  --  7.3*  --   --  6.9*  --   --   HCT 25.7*  --  25.7*  --   --  24.4*  --   --   PLT 234  --  225  --   --  193  --   --   APTT 29  --  40*  --   --  66*  --   --   LABPROT  --   --   --   --   --   --  15.0  --   INR  --   --   --   --   --   --  1.21  --   HEPARINUNFRC 0.46  < > 0.14* 0.17*  --  0.25*  --  0.38  CREATININE 1.15*  < > 1.13*  --  1.17* 1.07  --   --   < > = values in this interval not displayed.  Estimated Creatinine Clearance: 68 ml/min (by C-G formula based on Cr of 1.07).  Assessment: 68 y/o F with Afib for heparin Goal of Therapy:  Heparin level 0.3-0.7 units/ml Monitor platelets by anticoagulation protocol: Yes   Plan:  Continue Heparin at current rate Follow-up am labs.  Bronson Curb Yaakov Saindon 03/30/2014,12:45 AM

## 2014-03-30 NOTE — Progress Notes (Signed)
    Subjective:   Paige Hardin is a 68 year old female with a history of morbid obesity, severe COPD, history of lung nodules, diabetes mellitus, chronic diastolic congestive heart failure, and paroxysmal atrial fibrillation. The past several months she's had multiple admissions for congestive heart failure-all these are exacerbated by rapid atrial fibrillation. She's failed flecainide. Amiodarone was not started in the past because of her chronic lung disease.   She was also started on amiodarone in early April, 2015.  She was admitted to the hospital on 03/23/2014 with severe shortness of breath. She was intubated in the emergency room and was admitted by the critical care team.  Coughing up blood last pm- overall better this am- less SOB, less edema  Objective:  Vital Signs in the last 24 hours: Temp:  [97.4 F (36.3 C)-98.6 F (37 C)] 98 F (36.7 C) (05/04 0615) Pulse Rate:  [60-108] 60 (05/04 0615) Resp:  [16-18] 18 (05/04 0615) BP: (121-141)/(43-55) 134/54 mmHg (05/04 0615) SpO2:  [89 %-98 %] 95 % (05/04 0849) Weight:  [281 lb 9.6 oz (127.733 kg)] 281 lb 9.6 oz (127.733 kg) (05/04 0615)  Intake/Output from previous day:  Intake/Output Summary (Last 24 hours) at 03/30/14 0920 Last data filed at 03/30/14 0616  Gross per 24 hour  Intake 1217.5 ml  Output   3800 ml  Net -2582.5 ml    Physical Exam: General appearance: alert, cooperative, no distress and morbidly obese Lungs: decreased breath sounds Lt 1/3 Heart: regular rate and rhythm Extremities: chronic 2+ edema   Rate: 62  Rhythm: normal sinus rhythm  Lab Results:  Recent Labs  03/29/14 0502 03/30/14 0532  WBC 8.2 9.6  HGB 6.9* 8.7*  PLT 193 198    Recent Labs  03/29/14 0502 03/30/14 0532  NA 143 142  K 3.9 3.4*  CL 96 95*  CO2 39* 38*  GLUCOSE 116* 75  BUN 30* 24*  CREATININE 1.07 1.03   No results found for this basename: TROPONINI, CK, MB,  in the last 72 hours  Recent Labs  03/29/14 0907    INR 1.21    Imaging: Imaging results have been reviewed  Cardiac Studies:  Assessment/Plan:  Pt is a morbidly obese 68 year old woman with a history of diastolic heart failure, severe COPD, PAF, and DM. She was admitted with respiratory failure secondary to acute on chronic diastolic CHF and COPD exacerbation.She required intubation and when extubated she went into rapid AF. She was put on Amiodarone and cardioverted 03/27/14 to NSR. Over the weekend she developed significant anemia and was transfused 2 units. Last night she had significant hemoptysis, pr is on Heparin, she was on Pradaxa pta.   1. Atrial fibrillation:  She was just cardioverted on 03/27/2014. Unfortunately, she's had some hemoptysis for the past day or so. We'll discontinue the heparin.  Ideally we would like to restart the anticoagulation since it is so close to her recent cardioversion but we are limited by her hemoptysis. I would agree with  holding anticoagulation for now.  2. COPD exacerbation: She continues to have wheezing on exam.  3. Hypertension:  Her blood pressure is fairly well controlled  4. Morbid obesity: 5. Diabetes mellitus:-Poorly controlled 6. Chronic kidney disease-stage III: 7. Anemia:    Ramond Dial., MD, [facility] 03/30/2014, 10:04 AM Office - [phone] Pager [phone]

## 2014-03-30 NOTE — Progress Notes (Signed)
PULMONARY / CRITICAL CARE MEDICINE   Name: Paige Hardin MRN: [id number] DOB: July 14, 1946    ADMISSION DATE:  03/23/2014 CONSULTATION DATE:  03/23/2014  REFERRING MD : ED  PRIMARY SERVICE: PCCM  CHIEF COMPLAINT: SOB  BRIEF PATIENT DESCRIPTION:    68 year old woman with morbid obesity, severe COPD, lung nodules, DM-II, chronic diastolic HF (EF 60 % on 1/70/01), paroxysmal atrial fibrillation and s/p of cardioversion, who presents with worsening SOB and respiratory failure. Likely due to combinational for CHF and COPD exacerbation. Intubated in ED.   SIGNIFICANT EVENTS / STUDIES:  ETT 4/27>>> 4/29 5/1 DCCV return to sr 5/4 recurrent hemoptysis. DC heparin LINES / TUBES:  PIV 4/27>>>  CULTURES:  Blood culture 4/27> neg Sputum culture 4/27>>>  Tracheal aspirate 4/27>> neg Resp virus tracheal aspirate 4/27 >>> neg Urine culture 4/27 >>> neg Urine legionella 4/30 > neg Urine strep 4/30 > neg  ANTIBIOTICS:  Vancomycin 4/27>>> 4/30 Cefpime 4/27>>> 4/30 levaquin 4/30 >>   INTERVAL HISTORY/subjective Nad up in chair on 2lpm NP - Recurrent copious hemoptysis from 2300-0400  Staff MD. AT time of my rounds 12.30pm - heparin has been turned off and no further hemoptysis. NO CXR today  VITAL SIGNS:   Temp:  [97.4 F (36.3 C)-98.6 F (37 C)] 98 F (36.7 C) (05/04 0615) Pulse Rate:  [60-108] 60 (05/04 0615) Resp:  [16-18] 18 (05/04 0615) BP: (121-141)/(43-55) 134/54 mmHg (05/04 0615) SpO2:  [89 %-98 %] 95 % (05/04 0849) Weight:  [127.733 kg (281 lb 9.6 oz)] 127.733 kg (281 lb 9.6 oz) (05/04 0615)  HEMODYNAMICS:   VENTILATOR SETTINGS:  N/A  INTAKE / OUTPUT: Intake/Output     05/03 0701 - 05/04 0700 05/04 0701 - 05/05 0700   P.O. 720 480   Blood 737.5    Total Intake(mL/kg) 1457.5 (11.4) 480 (3.8)   Urine (mL/kg/hr) 3800 (1.2)    Total Output 3800     Net -2342.5 +480         PHYSICAL EXAMINATION: General:  Sitting up side of bed Neuro:  AAO, responding  appropriately HEENT:  WNL Cardiovascular:  Irregularly irregular Lungs: decreased BS B/L Abdomen:  +BS, soft, NT, ND Musculoskeletal:  Able to move extremities voluntarily Extremities:  1+ B/L lower extremity edema Skin:  Intact   LABS:   PULMONARY  Recent Labs Lab 03/24/14 0735 03/25/14 0251 03/25/14 0409 03/25/14 0613 03/26/14 0242  PHART 7.397 7.500* 7.518* 7.443 7.492*  PCO2ART 58.6* 49.2* 48.1* 57.0* 55.8*  PO2ART 52.0* 69.0* 68.0* 79.0* 67.0*  HCO3 36.0* 38.2* 38.9* 38.9* 42.7*  TCO2 38 40 40 41 44  O2SAT 85.0 94.0 94.0 95.0 94.0    CBC  Recent Labs Lab 03/28/14 0630 03/29/14 0502 03/30/14 0532  HGB 7.3* 6.9* 8.7*  HCT 25.7* 24.4* 29.4*  WBC 8.5 8.2 9.6  PLT 225 193 198    COAGULATION  Recent Labs Lab 03/23/14 2133 03/29/14 0907  INR 1.70* 1.21    CARDIAC   Recent Labs Lab 03/23/14 2133 03/24/14 0747 03/24/14 1400  TROPONINI <0.30 <0.30 <0.30    Recent Labs Lab 03/23/14 1715 03/26/14 0348  PROBNP 2475.0* 6751.0*     CHEMISTRY  Recent Labs Lab 03/27/14 1748 03/28/14 0630 03/28/14 2123 03/29/14 0502 03/30/14 0532  NA 142 145 138 143 142  K 4.2 4.0 4.1 3.9 3.4*  CL 95* 98 92* 96 95*  CO2 40* 41* 39* 39* 38*  GLUCOSE 152* 138* 218* 116* 75  BUN 32* 33* 34* 30* 24*  CREATININE 1.07 1.13* 1.17* 1.07 1.03  CALCIUM 8.6 9.0 8.7 9.0 8.9  MG 2.0 2.2 2.0 2.1 2.0  PHOS 2.9 3.4 3.6 3.3 3.4   Estimated Creatinine Clearance: 70.4 ml/min (by C-G formula based on Cr of 1.03).   LIVER  Recent Labs Lab 03/23/14 1715 03/23/14 2133 03/29/14 0907  AST 26  --   --   ALT 24  --   --   ALKPHOS 93  --   --   BILITOT 0.9  --   --   PROT 7.2  --   --   ALBUMIN 3.3*  --   --   INR  --  1.70* 1.21     INFECTIOUS  Recent Labs Lab 03/23/14 2133 03/26/14 0348  LATICACIDVEN 0.9  --   PROCALCITON 0.40 0.87     ENDOCRINE CBG (last 3)   Recent Labs  03/29/14 2217 03/30/14 0813 03/30/14 1109  GLUCAP 172* 67* 103*          IMAGING x48h  No results found.    ASSESSMENT / PLAN:  PULMONARY  A:  - Acute hypercapnic respiratory failure 2/2 to acute exacerbation of dHF (EF 60% TTE 02/15) with AECOPD likely contributing; extubated 04/29 - PAH, 53 mmHg    - 03/30/14: having recurrent hemoptysis. DDx : CHF NOS v SPN 59mm in 2011 in R[facility] P:  - CT chest wo contrast (today, staff MD note))  - AGree with dc heparin (today, staff MD note) - Albuterol neb q2h prn - Duoneb qid - IV Solu-medrol 60mg  q8h > changed to pred 40mg  on 4/30 and then tapered to 20 mg daily on 5/3    - Probably needs an outpt sleep f/u >  Dr Gwenette Greet   CARDIOVASCULAR  A:  - Hx of PAF (s/p of cardioversion), afib since 04/29 AM - cardiology evaluated the patient and switched po amio to gtt.  Plan for EP to evaluate further  - Acute on chronic dHF--> clinically volume overloaded at admission, proBNP 2475 --> 6751.0; net -6.6L; wt even P:  - EP >>  AV node ablation 5/1 - Continue home medications for Afib including amiodarone (gtt), metoprolol, and Cardizem > note prolonged QTc - Aggressive diuresis with Lasix. - Follow up BMP/Mg/Phos BID, replete electrolytes as needed  - Holding home Pradaxa; Lovenox --> heparin gtt dc 5/2 for hemoptysis  - VTE ppx - SCDs   RENAL  Lab Results  Component Value Date   CREATININE 1.03 03/30/2014   CREATININE 1.07 03/29/2014   CREATININE 1.17* 03/28/2014    A:  - CKD3, Cr stable - Hyperkalemia, resolved P:  - Decrease Lasix 40 mg BID  - Monitor BMP   GASTROINTESTINAL  A:  - GERD  P:  - on PO PPI, she was on prevacid at home - Advance to heart healthy diet.  HEMATOLOGIC  A:  - Leukocytosis, resolved   - Chronic anemia, baseline 7-9;    Recent Labs Lab 03/28/14 0630 03/29/14 0502 03/30/14 0532  HGB 7.3* 6.9* 8.7*    P:  -  Heparin dc 5/4 INFECTIOUS  A:  - Leukocytosis, productive cough with [place] sputum, no fever or chills; lactic acid 0.9 reassuring, PCT 0.4-->0.87 P:  -  Abx > see dashboard  ENDOCRINE  CBG (last 3)   Recent Labs  03/29/14 1635 03/29/14 2217 03/30/14 0813  GLUCAP 151* 172* 67*     A:  - DM type 2, CBGs > 200, steroid likely contributing P:  - SSI resistant  q4h, Lantus   - Hold PO DM regimen for now - Monitor CBG  NEUROLOGIC  A:  - Acute hypoxic encephalopathy 2/2 to acute respiratory failure, resolved  P:  - Monitor for change in neuro status   Richardson Landry Minor ACNP [facility] PCCM Pager [phone] till 3 pm If no answer page [phone] 03/30/2014, 10:33 AM  STAFF MD Get CT chest. Check bnp. Rest per NP   Dr. Brand Males, M.D., [facility]C.P Pulmonary and Critical Care Medicine Staff Physician [place] [facility] Pager: [phone], If no answer or between  15:00h - 7:00h: call [phone]  03/30/2014 12:38 PM

## 2014-03-31 ENCOUNTER — Encounter (HOSPITAL_COMMUNITY): Payer: Self-pay | Admitting: Cardiology

## 2014-03-31 LAB — CBC
HCT: 30.2 % — ABNORMAL LOW (ref 36.0–46.0)
Hemoglobin: 8.8 g/dL — ABNORMAL LOW (ref 12.0–15.0)
MCH: 25.2 pg — AB (ref 26.0–34.0)
MCHC: 29.1 g/dL — AB (ref 30.0–36.0)
MCV: 86.5 fL (ref 78.0–100.0)
PLATELETS: 232 10*3/uL (ref 150–400)
RBC: 3.49 MIL/uL — ABNORMAL LOW (ref 3.87–5.11)
RDW: 17.3 % — AB (ref 11.5–15.5)
WBC: 11.2 10*3/uL — ABNORMAL HIGH (ref 4.0–10.5)

## 2014-03-31 LAB — BASIC METABOLIC PANEL
BUN: 23 mg/dL (ref 6–23)
CALCIUM: 9.2 mg/dL (ref 8.4–10.5)
CO2: 38 mEq/L — ABNORMAL HIGH (ref 19–32)
CREATININE: 0.99 mg/dL (ref 0.50–1.10)
Chloride: 92 mEq/L — ABNORMAL LOW (ref 96–112)
GFR, EST AFRICAN AMERICAN: 66 mL/min — AB (ref >90)
GFR, EST NON AFRICAN AMERICAN: 57 mL/min — AB (ref >90)
Glucose, Bld: 116 mg/dL — ABNORMAL HIGH (ref 70–99)
Potassium: 3.7 mEq/L (ref 3.7–5.3)
Sodium: 140 mEq/L (ref 137–147)

## 2014-03-31 LAB — GLUCOSE, CAPILLARY
GLUCOSE-CAPILLARY: 228 mg/dL — AB (ref 70–99)
Glucose-Capillary: 127 mg/dL — ABNORMAL HIGH (ref 70–99)
Glucose-Capillary: 111 mg/dL — ABNORMAL HIGH (ref 70–99)
Glucose-Capillary: 251 mg/dL — ABNORMAL HIGH (ref 70–99)

## 2014-03-31 LAB — PHOSPHORUS: Phosphorus: 3.1 mg/dL (ref 2.3–4.6)

## 2014-03-31 LAB — MAGNESIUM: MAGNESIUM: 1.9 mg/dL (ref 1.5–2.5)

## 2014-03-31 LAB — PRO B NATRIURETIC PEPTIDE: PRO B NATRI PEPTIDE: 2952 pg/mL — AB (ref 0–125)

## 2014-03-31 NOTE — Progress Notes (Signed)
PULMONARY / CRITICAL CARE MEDICINE   Name: Paige Hardin MRN: [id number] DOB: Apr 10, 1946    ADMISSION DATE:  03/23/2014 CONSULTATION DATE:  03/23/2014  REFERRING MD : ED  PRIMARY SERVICE: PCCM  CHIEF COMPLAINT: SOB  BRIEF PATIENT DESCRIPTION:    68 year old woman with morbid obesity, severe COPD, lung nodules, DM-II, chronic diastolic HF (EF 60 % on 6/78/93), paroxysmal atrial fibrillation and s/p of cardioversion, who presents with worsening SOB and respiratory failure. Likely due to combinational for CHF and COPD exacerbation. Intubated in ED.   SIGNIFICANT EVENTS / STUDIES:  ETT 4/27>>> 4/29 5/1 DCCV return to sr 5/4 recurrent hemoptysis. DC heparin 5/5 no hemoptysis since 5/4 o400, off heparin 5/4 ct chest>> 1. Interstitial edema and pulmonary edema.  2. Bilateral pleural effusions and passive atelectasis.  3. Mild mediastinal adenopathy is likely reactive.  4. Right lower lobe pulmonary nodule is increased minimally over  3-1/2 year suggesting benign etiology. Recommend follow-up in 12  months.  LINES / TUBES:  PIV 4/27>>>  CULTURES:  Blood culture 4/27> neg Sputum culture 4/27>>>  Tracheal aspirate 4/27>> neg Resp virus tracheal aspirate 4/27 >>> neg Urine culture 4/27 >>> neg Urine legionella 4/30 > neg Urine strep 4/30 > neg  ANTIBIOTICS:  Vancomycin 4/27>>> 4/30 Cefpime 4/27>>> 4/30 levaquin 4/30 >>5/1 changed to po>>   INTERVAL HISTORY/subjective Nad up in chair on 2lpm . No further hemoptysis.  VITAL SIGNS:   Temp:  [97.2 F (36.2 C)-98.1 F (36.7 C)] 97.5 F (36.4 C) (05/05 0440) Pulse Rate:  [54-65] 54 (05/05 0440) Resp:  [18-20] 18 (05/05 0440) BP: (130-138)/(47-51) 130/51 mmHg (05/05 0440) SpO2:  [93 %-98 %] 93 % (05/05 0823) Weight:  [127.3 kg (280 lb 10.3 oz)] 127.3 kg (280 lb 10.3 oz) (05/05 0440)  HEMODYNAMICS:   VENTILATOR SETTINGS:  N/A  INTAKE / OUTPUT: Intake/Output     05/04 0701 - 05/05 0700 05/05 0701 - 05/06 0700   P.O. 1290  240   Blood     Total Intake(mL/kg) 1290 (10.1) 240 (1.9)   Urine (mL/kg/hr) 2525 (0.8)    Stool 1 (0)    Total Output 2526     Net -1236 +240         PHYSICAL EXAMINATION: General:  Sitting up in chair Neuro:  AAO, responding appropriately HEENT:  WNL Cardiovascular:  Irregularly irregular Lungs: decreased BS B/L, no hemoptysis Abdomen:  +BS, soft, NT, ND Musculoskeletal:  Able to move extremities voluntarily Extremities:  1+ B/L lower extremity edema Skin:  Intact   LABS:   PULMONARY  Recent Labs Lab 03/25/14 0251 03/25/14 0409 03/25/14 0613 03/26/14 0242  PHART 7.500* 7.518* 7.443 7.492*  PCO2ART 49.2* 48.1* 57.0* 55.8*  PO2ART 69.0* 68.0* 79.0* 67.0*  HCO3 38.2* 38.9* 38.9* 42.7*  TCO2 40 40 41 44  O2SAT 94.0 94.0 95.0 94.0    CBC  Recent Labs Lab 03/29/14 0502 03/30/14 0532 03/31/14 0600  HGB 6.9* 8.7* 8.8*  HCT 24.4* 29.4* 30.2*  WBC 8.2 9.6 11.2*  PLT 193 198 232    COAGULATION  Recent Labs Lab 03/29/14 0907  INR 1.21    CARDIAC    Recent Labs Lab 03/24/14 1400  TROPONINI <0.30    Recent Labs Lab 03/26/14 0348 03/31/14 0600  PROBNP 6751.0* 2952.0*     CHEMISTRY  Recent Labs Lab 03/28/14 2123 03/29/14 0502 03/30/14 0532 03/30/14 2145 03/31/14 0600  NA 138 143 142 138 140  K 4.1 3.9 3.4* 3.8 3.7  CL 92*  96 95* 91* 92*  CO2 39* 39* 38* 38* 38*  GLUCOSE 218* 116* 75 191* 116*  BUN 34* 30* 24* 26* 23  CREATININE 1.17* 1.07 1.03 1.05 0.99  CALCIUM 8.7 9.0 8.9 9.2 9.2  MG 2.0 2.1 2.0 1.8 1.9  PHOS 3.6 3.3 3.4 3.1 3.1   Estimated Creatinine Clearance: 73.1 ml/min (by C-G formula based on Cr of 0.99).   LIVER  Recent Labs Lab 03/29/14 0907  INR 1.21     INFECTIOUS  Recent Labs Lab 03/26/14 0348  PROCALCITON 0.87     ENDOCRINE CBG (last 3)   Recent Labs  03/30/14 1717 03/30/14 2127 03/31/14 0601  GLUCAP 171* 193* 127*         IMAGING x48h  Ct Chest Wo Contrast  03/30/2014   CLINICAL  DATA:  Hemoptysis.  Pulmonary nodule  EXAM: CT CHEST WITHOUT CONTRAST  TECHNIQUE: Multidetector CT imaging of the chest was performed following the standard protocol without IV contrast.  COMPARISON:  CT 09/09/2010  FINDINGS: Review of the lung parenchyma demonstrates extensive interlobular septal thickening in the upper lobes. There is ground-glass opacities suggesting pulmonary edema. There are bilateral pleural effusions with associated passive atelectasis. There is a rounded nodule in the right lower lobe measuring 7 x 8 mm which is slightly increased from 5 x 7 mm on comparison CT of the 09/09/2010. Central airways appear normal.  There is a port in the right anterior chest wall. No axillary or supraclavicular lymphadenopathy. Small paratracheal lymph nodes are slightly increased in volume compared to prior. For example right lower paratracheal lymph node measures 14 mm short axis. No pericardial fluid. Esophagus is normal. Coronary artery calcifications are present  Review of the upper abdomen is unremarkable. Limited view of the skeleton was noncontrast exam is unremarkable.  IMPRESSION: 1. Interstitial edema and pulmonary edema. 2. Bilateral pleural effusions and passive atelectasis. 3. Mild mediastinal adenopathy is likely reactive. 4. Right lower lobe pulmonary nodule is increased minimally over 3-1/2 year suggesting benign etiology. Recommend follow-up in 12 months.   Electronically Signed   By: Suzy Bouchard M.D.   On: 03/30/2014 16:53    Intake/Output Summary (Last 24 hours) at 03/31/14 0943 Last data filed at 03/31/14 0900  Gross per 24 hour  Intake    990 ml  Output   2526 ml  Net  -1536 ml   Filed Weights   03/29/14 0440 03/30/14 0615 03/31/14 0440  Weight: 128.4 kg (283 lb 1.1 oz) 127.733 kg (281 lb 9.6 oz) 127.3 kg (280 lb 10.3 oz)     ASSESSMENT / PLAN:  PULMONARY  A:  - Acute hypercapnic respiratory failure 2/2 to acute exacerbation of dHF (EF 60% TTE 02/15) with AECOPD  likely contributing; extubated 04/29 - PAH, 53 mmHg    - 03/30/14: having recurrent hemoptysis. DDx : CHF NOS v SPN 32mm in 2011 in [facility] 5/5 no further hemoptysis off heparin. CT chest suggests interstitital edema P:   - Leave heparin off x 24 more hours - Albuterol neb q2h prn - Duoneb qid - IV Solu-medrol 60mg  q8h > changed to pred 40mg  on 4/30 and then tapered to 20 mg daily on 5/3    - Probably needs an outpt sleep f/u >  Dr Gwenette Greet   CARDIOVASCULAR  A:  - Hx of PAF (s/p of cardioversion), afib since 04/29 AM - cardiology evaluated the patient and switched po amio to gtt.  Plan for EP to evaluate further  - Acute on chronic  dHF--> clinically volume overloaded at admission,  P:  - EP >>  AV node ablation 5/1 - Continue home medications for Afib including amiodarone (gtt), metoprolol, and Cardizem > note prolonged QTc - Aggressive diuresis with Lasix. (CT chest suggests edema) - Follow up BMP/Mg/Phos BID, replete electrolytes as needed  - Holding home Pradaxa; Lovenox --> heparin gtt dc 5/4 for hemoptysis  - VTE ppx - SCDs   RENAL  Lab Results  Component Value Date   CREATININE 0.99 03/31/2014   CREATININE 1.05 03/30/2014   CREATININE 1.03 03/30/2014    A:  - CKD3, Cr stable - Hyperkalemia, resolved P:  - Decrease Lasix 40 mg BID  - Monitor BMP   GASTROINTESTINAL  A:  - GERD  P:  - on PO PPI, she was on prevacid at home - Advance to heart healthy diet.  HEMATOLOGIC  A:  - Leukocytosis, resolved   - Chronic anemia, baseline 7-9;    Recent Labs Lab 03/29/14 0502 03/30/14 0532 03/31/14 0600  HGB 6.9* 8.7* 8.8*    P:  -  Heparin dc 5/4 INFECTIOUS  A:  - Leukocytosis, productive cough with [place] sputum, no fever or chills; lactic acid 0.9 reassuring, PCT 0.4-->0.87 P:  - Abx > see dashboard  ENDOCRINE  CBG (last 3)   Recent Labs  03/30/14 1717 03/30/14 2127 03/31/14 0601  GLUCAP 171* 193* 127*     A:  - DM type 2, CBGs > 200, steroid likely  contributing P:  - SSI resistant q4h, Lantus   - Hold PO DM regimen for now - Monitor CBG  NEUROLOGIC  A:  - Acute hypoxic encephalopathy 2/2 to acute respiratory failure, resolved  P:  - Monitor for change in neuro status  Global:  No further hemoptysis 5/5. Hold heparin x 24 more  hours CT chest noted. Needs continud lasix     Richardson Landry Minor ACNP [facility] PCCM Pager [phone] till 3 pm If no answer page [phone] 03/31/2014, 9:36 AM   Staff note CT suggests interstitial edema. Needs more diuresis. Rest per NP   Dr. Brand Males, M.D., [facility].C.P Pulmonary and Critical Care Medicine Staff Physician [place] [facility] Pager: [phone], If no answer or between  15:00h - 7:00h: call [phone]  03/31/2014 11:06 AM

## 2014-03-31 NOTE — Progress Notes (Signed)
    Subjective:   Paige Hardin is a 68 year old female with a history of morbid obesity, severe COPD, history of lung nodules, diabetes mellitus, chronic diastolic congestive heart failure, and paroxysmal atrial fibrillation. The past several months she's had multiple admissions for congestive heart failure-all these are exacerbated by rapid atrial fibrillation. She's failed flecainide. Amiodarone was not started in the past because of her chronic lung disease.   She was also started on amiodarone in early April, 2015.  She was admitted to the hospital on 03/23/2014 with severe shortness of breath. She was intubated in the emergency room and was admitted by the critical care team.  Coughing up blood last pm- overall better this am- less SOB, less edema  Objective:  Vital Signs in the last 24 hours: Temp:  [97.2 F (36.2 C)-98.1 F (36.7 C)] 97.5 F (36.4 C) (05/05 0440) Pulse Rate:  [54-65] 61 (05/05 1030) Resp:  [18-20] 18 (05/05 0440) BP: (121-138)/(47-51) 121/51 mmHg (05/05 1030) SpO2:  [93 %-98 %] 93 % (05/05 0823) Weight:  [280 lb 10.3 oz (127.3 kg)] 280 lb 10.3 oz (127.3 kg) (05/05 0440)  Intake/Output from previous day:  Intake/Output Summary (Last 24 hours) at 03/31/14 1316 Last data filed at 03/31/14 0900  Gross per 24 hour  Intake    960 ml  Output   2201 ml  Net  -1241 ml    Physical Exam: General appearance: alert, cooperative, no distress and morbidly obese Lungs: decreased breath sounds Lt 1/3 Heart: regular rate and rhythm Extremities: chronic 2+ edema   Rate: 62  Rhythm: normal sinus rhythm  Lab Results:  Recent Labs  03/30/14 0532 03/31/14 0600  WBC 9.6 11.2*  HGB 8.7* 8.8*  PLT 198 232    Recent Labs  03/30/14 2145 03/31/14 0600  NA 138 140  K 3.8 3.7  CL 91* 92*  CO2 38* 38*  GLUCOSE 191* 116*  BUN 26* 23  CREATININE 1.05 0.99   No results found for this basename: TROPONINI, CK, MB,  in the last 72 hours  Recent Labs  03/29/14 0907    INR 1.21    Imaging: Imaging results have been reviewed  Cardiac Studies:  Assessment/Plan:  Pt is a morbidly obese 68 year old woman with a history of diastolic heart failure, severe COPD, PAF, and DM. She was admitted with respiratory failure secondary to acute on chronic diastolic CHF and COPD exacerbation.She required intubation and when extubated she went into rapid AF. She was put on Amiodarone and cardioverted 03/27/14 to NSR. Over the weekend she developed significant anemia and was transfused 2 units. Last night she had significant hemoptysis, pr is on Heparin, she was on Pradaxa pta.   1. Atrial fibrillation:  She was just cardioverted on 03/27/2014. Unfortunately, she's had some hemoptysis for the past day or so. We'll discontinue the heparin.  Ideally we would like to restart the anticoagulation since it is so close to her recent cardioversion but we are limited by her hemoptysis. I would agree with  holding anticoagulation for now. I will defer to Dr. Chase Caller as to when/ if  she can resume her Pradaxa.   2. COPD exacerbation: She continues to have wheezing on exam.  3. Hypertension:  Her blood pressure is fairly well controlled  4. Morbid obesity: 5. Diabetes mellitus:-Poorly controlled 6. Chronic kidney disease-stage III: 7. Anemia:    Paige Hardin., MD, [facility] 03/31/2014, 1:16 PM Office - [phone] Pager [phone]

## 2014-03-31 NOTE — Plan of Care (Signed)
Problem: Phase I Progression Outcomes Goal: Hemodynamically stable Outcome: Progressing Vital signs stable.      

## 2014-03-31 NOTE — Progress Notes (Signed)
NUTRITION FOLLOW UP  Intervention:    No further nutrition interventions needed at this time, pt eating well  Nutrition Dx:   Inadequate oral intake related to inability to eat as evidenced by NPO status. discontinued  New Goal:   Intake to meet >90% of estimated nutrition needs. Met.  Monitor:   Diet advancement, PO intake, labs, weight trend.  Assessment:   The patient is a 68 year old woman with morbid obesity, severe COPD, lung nodules, DM-II, chronic diastolic HF (EF 60 % on 1/93/79), paroxysmal atrial fibrillation and s/p of cardioversion, who presents with worsening SOB and respiratory failure. Likely due to combinational for CHF and COPD exacerbation. Intubated in ED.   Patient was extubated on 4/29. Diet was advanced to Heart Healthy/ Carb Modified and pt is consuming 75-100% of most meals. Weight is down 23 lbs since admission and pt is now approximately 13 lbs below her usual body weight. Last BM 5/4. Labs reviewed.  Height: Ht Readings from Last 1 Encounters:  03/26/14 _0  (1.651 m)    Weight Status:   Wt Readings from Last 1 Encounters:  03/31/14 280 lb 10.3 oz (127.3 kg)  03/24/14  303 lb 2.1 oz (137.5 kg)  Re-estimated needs:  Kcal: 1950-2100 Protein: 105-120 gm Fluid: > 2 L  Skin: no wounds; +1 generalized edema and +3 RLE and LLE edema  Diet Order:     Intake/Output Summary (Last 24 hours) at 03/31/14 1252 Last data filed at 03/31/14 0900  Gross per 24 hour  Intake    960 ml  Output   2201 ml  Net  -1241 ml    Last BM: 5/4  Labs:   Recent Labs Lab 03/30/14 0532 03/30/14 2145 03/31/14 0600  NA 142 138 140  K 3.4* 3.8 3.7  CL 95* 91* 92*  CO2 38* 38* 38*  BUN 24* 26* 23  CREATININE 1.03 1.05 0.99  CALCIUM 8.9 9.2 9.2  MG 2.0 1.8 1.9  PHOS 3.4 3.1 3.1  GLUCOSE 75 191* 116*    CBG (last 3)   Recent Labs  03/30/14 2127 03/31/14 0601 03/31/14 1033  GLUCAP 193* 127* 111*    Scheduled Meds: . amiodarone  200 mg Oral BID  .  antiseptic oral rinse  15 mL Mouth Rinse q12n4p  . ezetimibe  10 mg Oral QHS   And  . atorvastatin  20 mg Oral QHS  . calcium carbonate (dosed in mg elemental calcium)  500 mg of elemental calcium Oral Q breakfast  . chlorhexidine  15 mL Mouth Rinse BID  . cholecalciferol  2,000 Units Oral Daily  . diltiazem  180 mg Oral Daily  . furosemide  40 mg Intravenous BID  . insulin aspart  0-20 Units Subcutaneous TID WC  . insulin glargine  15 Units Subcutaneous BID  . ipratropium-albuterol  3 mL Nebulization QID  . levofloxacin  750 mg Oral Daily  . metoprolol  50 mg Oral BID  . pantoprazole  40 mg Oral BID AC  . potassium chloride  20 mEq Oral Daily  . predniSONE  20 mg Oral Q breakfast  . sodium chloride  3 mL Intravenous Q12H  . vitamin B-12  500 mcg Oral Daily    Continuous Infusions:   Pryor Ochoa RD, LDN Inpatient Clinical Dietitian Pager: [phone] After Hours Pager: [phone]

## 2014-03-31 NOTE — Plan of Care (Signed)
Problem: Consults Goal: Skin Care Protocol Initiated - if Braden Score 18 or less If consults are not indicated, leave blank or document N/A  Outcome: Progressing Sacral intact.

## 2014-04-01 ENCOUNTER — Encounter (HOSPITAL_COMMUNITY): Payer: Self-pay | Admitting: Nurse Practitioner

## 2014-04-01 LAB — BASIC METABOLIC PANEL
BUN: 21 mg/dL (ref 6–23)
BUN: 20 mg/dL (ref 6–23)
CALCIUM: 9.2 mg/dL (ref 8.4–10.5)
CHLORIDE: 97 meq/L (ref 96–112)
CO2: 39 meq/L — AB (ref 19–32)
CO2: 35 mEq/L — ABNORMAL HIGH (ref 19–32)
Calcium: 9 mg/dL (ref 8.4–10.5)
Chloride: 93 mEq/L — ABNORMAL LOW (ref 96–112)
Creatinine, Ser: 1.04 mg/dL (ref 0.50–1.10)
Creatinine, Ser: 1.15 mg/dL — ABNORMAL HIGH (ref 0.50–1.10)
GFR calc Af Amer: 63 mL/min — ABNORMAL LOW (ref >90)
GFR calc non Af Amer: 54 mL/min — ABNORMAL LOW (ref >90)
GFR calc non Af Amer: 48 mL/min — ABNORMAL LOW (ref >90)
GFR, EST AFRICAN AMERICAN: 55 mL/min — AB (ref >90)
GLUCOSE: 196 mg/dL — AB (ref 70–99)
Glucose, Bld: 104 mg/dL — ABNORMAL HIGH (ref 70–99)
POTASSIUM: 3.5 meq/L — AB (ref 3.7–5.3)
Potassium: 4.2 mEq/L (ref 3.7–5.3)
SODIUM: 142 meq/L (ref 137–147)
Sodium: 140 mEq/L (ref 137–147)

## 2014-04-01 LAB — MAGNESIUM
MAGNESIUM: 1.9 mg/dL (ref 1.5–2.5)
Magnesium: 1.7 mg/dL (ref 1.5–2.5)

## 2014-04-01 LAB — CBC
HEMATOCRIT: 29.3 % — AB (ref 36.0–46.0)
Hemoglobin: 8.6 g/dL — ABNORMAL LOW (ref 12.0–15.0)
MCH: 25.4 pg — ABNORMAL LOW (ref 26.0–34.0)
MCHC: 29.4 g/dL — AB (ref 30.0–36.0)
MCV: 86.4 fL (ref 78.0–100.0)
Platelets: 248 10*3/uL (ref 150–400)
RBC: 3.39 MIL/uL — ABNORMAL LOW (ref 3.87–5.11)
RDW: 17.5 % — AB (ref 11.5–15.5)
WBC: 9.7 10*3/uL (ref 4.0–10.5)

## 2014-04-01 LAB — GLUCOSE, CAPILLARY
GLUCOSE-CAPILLARY: 162 mg/dL — AB (ref 70–99)
Glucose-Capillary: 97 mg/dL (ref 70–99)
Glucose-Capillary: 180 mg/dL — ABNORMAL HIGH (ref 70–99)
Glucose-Capillary: 223 mg/dL — ABNORMAL HIGH (ref 70–99)

## 2014-04-01 LAB — PHOSPHORUS
PHOSPHORUS: 3.5 mg/dL (ref 2.3–4.6)
PHOSPHORUS: 2.9 mg/dL (ref 2.3–4.6)

## 2014-04-01 LAB — HEPARIN LEVEL (UNFRACTIONATED): HEPARIN UNFRACTIONATED: 0.21 [IU]/mL — AB (ref 0.30–0.70)

## 2014-04-01 MED ORDER — INSULIN ASPART 100 UNIT/ML ~~LOC~~ SOLN
3.0000 [IU] | Freq: Three times a day (TID) | SUBCUTANEOUS | Status: DC
  Administered 2014-04-01 – 2014-04-03 (×4): 3 [IU] via SUBCUTANEOUS

## 2014-04-01 MED ORDER — HEPARIN (PORCINE) IN NACL 100-0.45 UNIT/ML-% IJ SOLN
1550.0000 [IU]/h | INTRAMUSCULAR | Status: DC
  Administered 2014-04-01: 1200 [IU]/h via INTRAVENOUS
  Administered 2014-04-02: 1550 [IU]/h via INTRAVENOUS
  Filled 2014-04-01 (×3): qty 250

## 2014-04-01 MED ORDER — POTASSIUM CHLORIDE CRYS ER 20 MEQ PO TBCR
40.0000 meq | EXTENDED_RELEASE_TABLET | Freq: Every day | ORAL | Status: DC
  Administered 2014-04-01 – 2014-04-03 (×3): 40 meq via ORAL
  Filled 2014-04-01 (×3): qty 2

## 2014-04-01 MED ORDER — PREDNISONE 10 MG PO TABS
10.0000 mg | ORAL_TABLET | Freq: Every day | ORAL | Status: DC
  Administered 2014-04-02 – 2014-04-03 (×2): 10 mg via ORAL
  Filled 2014-04-01 (×3): qty 1

## 2014-04-01 NOTE — Progress Notes (Signed)
ANTICOAGULATION CONSULT NOTE - Follow Up Consult  Pharmacy Consult for heparin Indication: atrial fibrillation  Allergies  Allergen Reactions  . Ace Inhibitors Cough  . Clindamycin Rash    Patient Measurements: Height: 5\' 5"  (165.1 cm) Weight: 278 lb 6.4 oz (126.281 kg) (bed scale) IBW/kg (Calculated) : 57 Heparin Dosing Weight: 91 kg  Vital Signs: Temp: 97.9 F (36.6 C) (05/06 1400) Temp src: Oral (05/06 1400) BP: 129/44 mmHg (05/06 1400) Pulse Rate: 65 (05/06 1400)  Labs:  Recent Labs  03/29/14 2359  03/30/14 0532 03/30/14 2145 03/31/14 0600 04/01/14 0535  HGB  --   < > 8.7*  --  8.8* 8.6*  HCT  --   --  29.4*  --  30.2* 29.3*  PLT  --   --  198  --  232 248  HEPARINUNFRC 0.38  --  0.30  --   --   --   CREATININE  --   < > 1.03 1.05 0.99 1.04  < > = values in this interval not displayed.  Estimated Creatinine Clearance: 69.2 ml/min (by C-G formula based on Cr of 1.04).   Medications:  Scheduled:  . amiodarone  200 mg Oral BID  . antiseptic oral rinse  15 mL Mouth Rinse q12n4p  . ezetimibe  10 mg Oral QHS   And  . atorvastatin  20 mg Oral QHS  . calcium carbonate (dosed in mg elemental calcium)  500 mg of elemental calcium Oral Q breakfast  . chlorhexidine  15 mL Mouth Rinse BID  . cholecalciferol  2,000 Units Oral Daily  . diltiazem  180 mg Oral Daily  . furosemide  40 mg Intravenous BID  . insulin aspart  0-20 Units Subcutaneous TID WC  . insulin aspart  3 Units Subcutaneous TID WC  . insulin glargine  15 Units Subcutaneous BID  . ipratropium-albuterol  3 mL Nebulization QID  . levofloxacin  750 mg Oral Daily  . metoprolol  50 mg Oral BID  . pantoprazole  40 mg Oral BID AC  . potassium chloride  40 mEq Oral Daily  . [START ON 04/02/2014] predniSONE  10 mg Oral Q breakfast  . sodium chloride  3 mL Intravenous Q12H  . vitamin B-12  500 mcg Oral Daily   Infusions:  . heparin 1,200 Units/hr (04/01/14 1217)    Assessment: 68 yo female with afib is  currently on subtherapeutic heparin.  Heparin level is 0.21. No problem with heparin infusion per RN  Goal of Therapy:  Heparin level 0.3-0.7 units/ml Monitor platelets by anticoagulation protocol: Yes   Plan:  1) Increase heparin drip to 1400 units/hr. Check an 6 hr heparin level after drip rate is changed   Tsz-Yin Jeriko Kowalke 04/01/2014,5:38 PM

## 2014-04-01 NOTE — Progress Notes (Signed)
Inpatient Diabetes Program Recommendations  AACE/ADA: New Consensus Statement on Inpatient Glycemic Control (2013)  Target Ranges:  Prepandial:   less than 140 mg/dL      Peak postprandial:   less than 180 mg/dL (1-2 hours)      Critically ill patients:  140 - 180 mg/dL   Results for Paige Hardin, Paige Hardin (MRN [id number]) as of 04/01/2014 10:14  Ref. Range 03/31/2014 06:01 03/31/2014 10:33 03/31/2014 16:11 03/31/2014 21:17 04/01/2014 06:22  Glucose-Capillary Latest Range: 70-99 mg/dL 127 (H) 111 (H) 251 (H) 228 (H) 97   Post-prandial blood sugars elevated. FBS look reasonable.  Consider addition of Novolog meal coverage insulin - 3 units tidwc if pt eats >50% meal.  Will continue to follow. Thank you. Lorenda Peck, RD, LDN, CDE Inpatient Diabetes Coordinator [phone]

## 2014-04-01 NOTE — Progress Notes (Signed)
Covering CSW following for discharge summary. FL2 on chart for MD signature. CSW to coordinate discharge to [facility] when pt is medically ready for discharge.   Ky Barban, MSW, [facility] Clinical Social Worker [phone]

## 2014-04-01 NOTE — Progress Notes (Signed)
PULMONARY / CRITICAL CARE MEDICINE   Name: Paige Hardin MRN: [id number] DOB: Jun 30, 1946    ADMISSION DATE:  03/23/2014 CONSULTATION DATE:  03/23/2014  REFERRING MD : ED  PRIMARY SERVICE: PCCM  CHIEF COMPLAINT: SOB  BRIEF PATIENT DESCRIPTION:    68 year old woman with morbid obesity, severe COPD, lung nodules, DM-II, chronic diastolic HF (EF 60 % on 2/40/97), paroxysmal atrial fibrillation and s/p of cardioversion, who presents with worsening SOB and respiratory failure. Likely due to combinational for CHF and COPD exacerbation. Intubated in ED.   SIGNIFICANT EVENTS / STUDIES:  ETT 4/27>>> 4/29 5/1 DCCV return to sr 5/4 recurrent hemoptysis. DC heparin 5/5 no hemoptysis since 5/4 o400, off heparin 5/4 ct chest>>1. Interstitial edema and pulmonary edema. 2. Bilateral pleural effusions and passive atelectasis. 3. Mild mediastinal adenopathy is likely reactive. 4. Right lower lobe pulmonary nodule is increased minimally over 3-1/2 year suggesting benign etiology.   LINES / TUBES:    CULTURES:  Blood culture 4/27> neg Sputum culture 4/27>>>  Tracheal aspirate 4/27>> neg Resp virus tracheal aspirate 4/27 >>> neg Urine culture 4/27 >>> neg Urine legionella 4/30 > neg Urine strep 4/30 > neg  ANTIBIOTICS:  Vancomycin 4/27>>> 4/30 Cefpime 4/27>>> 4/30 levaquin 4/30 >>5/1 changed to po>>   INTERVAL HISTORY/subjective No further hemoptysis.  Denies SOB.    VITAL SIGNS:   Temp:  [97.8 F (36.6 C)-98.4 F (36.9 C)] 97.8 F (36.6 C) (05/06 0924) Pulse Rate:  [62-73] 73 (05/06 0924) Resp:  [17-18] 18 (05/06 0924) BP: (122-143)/(40-52) 124/40 mmHg (05/06 0924) SpO2:  [91 %-97 %] 91 % (05/06 0924) Weight:  [278 lb 6.4 oz (126.281 kg)] 278 lb 6.4 oz (126.281 kg) (05/06 0537)  HEMODYNAMICS:   VENTILATOR SETTINGS:  N/A  INTAKE / OUTPUT: Intake/Output     05/05 0701 - 05/06 0700 05/06 0701 - 05/07 0700   P.O. 920 240   Total Intake(mL/kg) 920 (7.3) 240 (1.9)   Urine (mL/kg/hr)  2900 (1)    Stool     Total Output 2900     Net -1980 +240         PHYSICAL EXAMINATION: General:  Obese female, NAD sitting up in chair Neuro:  AAO, responding appropriately, MAE, gen weakness  HEENT:  WNL Cardiovascular:  Irregularly irregular Lungs: decreased BS B/L, no hemoptysis Abdomen:  +BS, soft, NT, ND Musculoskeletal:  Able to move extremities voluntarily Extremities:  1+ B/L lower extremity edema Skin:  Intact   LABS:   PULMONARY  Recent Labs Lab 03/26/14 0242  PHART 7.492*  PCO2ART 55.8*  PO2ART 67.0*  HCO3 42.7*  TCO2 44  O2SAT 94.0    CBC  Recent Labs Lab 03/30/14 0532 03/31/14 0600 04/01/14 0535  HGB 8.7* 8.8* 8.6*  HCT 29.4* 30.2* 29.3*  WBC 9.6 11.2* 9.7  PLT 198 232 248    COAGULATION  Recent Labs Lab 03/29/14 0907  INR 1.21    CARDIAC   No results found for this basename: TROPONINI,  in the last 168 hours  Recent Labs Lab 03/26/14 0348 03/31/14 0600  PROBNP 6751.0* 2952.0*     CHEMISTRY  Recent Labs Lab 03/29/14 0502 03/30/14 0532 03/30/14 2145 03/31/14 0600 04/01/14 0535  NA 143 142 138 140 142  K 3.9 3.4* 3.8 3.7 3.5*  CL 96 95* 91* 92* 97  CO2 39* 38* 38* 38* 39*  GLUCOSE 116* 75 191* 116* 104*  BUN 30* 24* 26* 23 21  CREATININE 1.07 1.03 1.05 0.99 1.04  CALCIUM  9.0 8.9 9.2 9.2 9.0  MG 2.1 2.0 1.8 1.9 1.9  PHOS 3.3 3.4 3.1 3.1 3.5   Estimated Creatinine Clearance: 69.2 ml/min (by C-G formula based on Cr of 1.04).   LIVER  Recent Labs Lab 03/29/14 0907  INR 1.21     INFECTIOUS  Recent Labs Lab 03/26/14 0348  PROCALCITON 0.87     ENDOCRINE CBG (last 3)   Recent Labs  03/31/14 1611 03/31/14 2117 04/01/14 0622  GLUCAP 251* 228* 97         IMAGING x48h  Ct Chest Wo Contrast  03/30/2014   CLINICAL DATA:  Hemoptysis.  Pulmonary nodule  EXAM: CT CHEST WITHOUT CONTRAST  TECHNIQUE: Multidetector CT imaging of the chest was performed following the standard protocol without IV  contrast.  COMPARISON:  CT 09/09/2010  FINDINGS: Review of the lung parenchyma demonstrates extensive interlobular septal thickening in the upper lobes. There is ground-glass opacities suggesting pulmonary edema. There are bilateral pleural effusions with associated passive atelectasis. There is a rounded nodule in the right lower lobe measuring 7 x 8 mm which is slightly increased from 5 x 7 mm on comparison CT of the 09/09/2010. Central airways appear normal.  There is a port in the right anterior chest wall. No axillary or supraclavicular lymphadenopathy. Small paratracheal lymph nodes are slightly increased in volume compared to prior. For example right lower paratracheal lymph node measures 14 mm short axis. No pericardial fluid. Esophagus is normal. Coronary artery calcifications are present  Review of the upper abdomen is unremarkable. Limited view of the skeleton was noncontrast exam is unremarkable.  IMPRESSION: 1. Interstitial edema and pulmonary edema. 2. Bilateral pleural effusions and passive atelectasis. 3. Mild mediastinal adenopathy is likely reactive. 4. Right lower lobe pulmonary nodule is increased minimally over 3-1/2 year suggesting benign etiology. Recommend follow-up in 12 months.   Electronically Signed   By: Suzy Bouchard M.D.   On: 03/30/2014 16:53    Intake/Output Summary (Last 24 hours) at 04/01/14 1104 Last data filed at 04/01/14 0852  Gross per 24 hour  Intake    920 ml  Output   2900 ml  Net  -1980 ml   Filed Weights   03/30/14 0615 03/31/14 0440 04/01/14 0537  Weight: 281 lb 9.6 oz (127.733 kg) 280 lb 10.3 oz (127.3 kg) 278 lb 6.4 oz (126.281 kg)     ASSESSMENT / PLAN:  PULMONARY  A:  - Acute hypercapnic respiratory failure 2/2 to acute exacerbation of dHF (EF 60% TTE 02/15) with AECOPD likely contributing; extubated 04/29 - PAH, 53 mmHg  -hemoptysis - in setting anticoagulation.  Resolved 5/6.  P:  - Albuterol neb q2h prn - Duoneb qid - cont prednisone  with taper   - Probably needs an outpt sleep f/u >  Dr Gwenette Greet - resume heparin gtt 5/6 and monitor closely for s/s bleeding  - see cards    CARDIOVASCULAR  A:  - Hx of PAF - Afib since 4/29 am, cardioverted 5/1, now in NSR.  - Acute on chronic dHF P:  - EP >>  AV node ablation 5/1 - amiodarone, metoprolol, and Cardizem PO  - continue diuresis as bp, scr allow -- cont BID lasix, likely change back to daily 5/7 - Follow up BMP/Mg/Phos BID, replete electrolytes as needed  - resume anticoagulation 5/6 with heparin gtt and monitor closely for s/s bleeding -- d/w cards - ideally needs anticoagulation min 4-6 weeks.  Consider eliquis rather than pradaxa.    A:  -  CKD3, Cr stable - Hyperkalemia, resolved P:  - lasix BID as above  - Monitor BMP    GASTROINTESTINAL  A:  - GERD  P:  - on PO PPI, she was on prevacid at home - heart healthy diet.   HEMATOLOGIC  A:  - Leukocytosis, resolved   - Chronic anemia, baseline 7-9;   P:  -resume heparin 5/6 -trend cbc    INFECTIOUS  A:  - Leukocytosis, productive cough with [place] sputum, no fever or chills; lactic acid 0.9 reassuring, PCT 0.4-->0.87 P:  - cont abx as above - d/c levaquin 5/7 (7 days total)   ENDOCRINE  CBG (last 3)  A:  - DM type 2, CBGs > 200, steroid likely contributing P:  - SSI resistant q4h, Lantus   - add meal coverage 5/6  -watch closely with steroids taper   NEUROLOGIC  A:  - Acute hypoxic encephalopathy 2/2 to acute respiratory failure, resolved  P:  - Monitor for change in neuro status  Global: No further hemoptysis.  Will try heparin gtt again and if tolerates anticoagulation consider eliquis. Cont diuresis.  Likely d/c in next 1-2 days.    Marijean Heath, NP 04/01/2014  11:20 AM Pager: [phone] or [phone]  *Care during the described time interval was provided by me and/or other providers on the critical care team. I have reviewed this patient's available data, including  medical history, events of note, physical examination and test results as part of my evaluation.  Patient seen and examined, agree with above note.  I dictated the care and orders written for this patient under my direction.  Rush Farmer, MD [phone]

## 2014-04-01 NOTE — Progress Notes (Signed)
No change in d/c plan. Will return to [facility] when medically stable per MD.  CSW will continue to follow and assist with d/c.  Lorie Phenix. [place], [place]

## 2014-04-01 NOTE — Progress Notes (Signed)
Patient Name: Paige Hardin Date of Encounter: 04/01/2014   Principal Problem:   Acute respiratory failure with hypoxia Active Problems:   Acute on chronic diastolic congestive heart failure   Hemoptysis   COPD with emphysema    Pulmonary HTN- pa 53 mmHg Echo 01/15/14   Morbid obesity- BMI 50   Poorly controlled diabetes mellitus   Chronic renal disease, stage III   COPD exacerbation   Atrial fibrillation with RVR- DCCV 03/27/14   HYPERLIPIDEMIA-MIXED   HTN (hypertension)   Chronic anticoagulation   Iron deficiency anemia-transfused 2 untis   SUBJECTIVE  Maintaining sinus rhythm.  Breathing continues to improve.  No further hemoptysis.  -1.9L overnight, -15.5L for admission.  Wt down to 278 lbs (308 on admission).  CURRENT MEDS . amiodarone  200 mg Oral BID  . antiseptic oral rinse  15 mL Mouth Rinse q12n4p  . ezetimibe  10 mg Oral QHS   And  . atorvastatin  20 mg Oral QHS  . calcium carbonate (dosed in mg elemental calcium)  500 mg of elemental calcium Oral Q breakfast  . chlorhexidine  15 mL Mouth Rinse BID  . cholecalciferol  2,000 Units Oral Daily  . diltiazem  180 mg Oral Daily  . furosemide  40 mg Intravenous BID  . insulin aspart  0-20 Units Subcutaneous TID WC  . insulin aspart  3 Units Subcutaneous TID WC  . insulin glargine  15 Units Subcutaneous BID  . ipratropium-albuterol  3 mL Nebulization QID  . levofloxacin  750 mg Oral Daily  . metoprolol  50 mg Oral BID  . pantoprazole  40 mg Oral BID AC  . potassium chloride  20 mEq Oral Daily  . [START ON 04/02/2014] predniSONE  10 mg Oral Q breakfast  . sodium chloride  3 mL Intravenous Q12H  . vitamin B-12  500 mcg Oral Daily    OBJECTIVE  Filed Vitals:   03/31/14 2032 03/31/14 2117 04/01/14 0537 04/01/14 0924  BP: 140/52  143/49 124/40  Pulse: 66  62 73  Temp: 98.3 F (36.8 C)  98.2 F (36.8 C) 97.8 F (36.6 C)  TempSrc: Oral  Oral Oral  Resp: 18  17 18   Height:      Weight:   278 lb 6.4 oz (126.281 kg)    SpO2: 96% 96% 97% 91%    Intake/Output Summary (Last 24 hours) at 04/01/14 1125 Last data filed at 04/01/14 0852  Gross per 24 hour  Intake    920 ml  Output   2900 ml  Net  -1980 ml   Filed Weights   03/30/14 0615 03/31/14 0440 04/01/14 0537  Weight: 281 lb 9.6 oz (127.733 kg) 280 lb 10.3 oz (127.3 kg) 278 lb 6.4 oz (126.281 kg)    PHYSICAL EXAM  General: Pleasant, NAD. Neuro: Alert and oriented X 3. Moves all extremities spontaneously. Psych: Normal affect. HEENT:  Normal  Neck: Supple without bruits.  JVP ~ 12 cm. Lungs:  Resp regular and unlabored, diminished throughout with occas exp wheeze and faint bibasilar crackles. Heart: RRR no s3, s4, 2/6 Syst murmur @ bilat USB. Abdomen: Soft, non-tender, non-distended, BS + x 4.  Extremities: No clubbing, cyanosis.  Still with 1+ bilat LEE. DP/PT/Radials 2+ and equal bilaterally.  Accessory Clinical Findings  CBC  Recent Labs  03/31/14 0600 04/01/14 0535  WBC 11.2* 9.7  HGB 8.8* 8.6*  HCT 30.2* 29.3*  MCV 86.5 86.4  PLT 232 259   Basic Metabolic Panel  Recent Labs  03/31/14 0600 04/01/14 0535  NA 140 142  K 3.7 3.5*  CL 92* 97  CO2 38* 39*  GLUCOSE 116* 104*  BUN 23 21  CREATININE 0.99 1.04  CALCIUM 9.2 9.0  MG 1.9 1.9  PHOS 3.1 3.5   Fasting Lipid Panel  Recent Labs  03/30/14 0532  TRIG 15   TELE  rsr  Radiology/Studies  Ct Chest Wo Contrast  03/30/2014   CLINICAL DATA:  Hemoptysis.  Pulmonary nodule  EXAM: CT CHEST WITHOUT CONTRAST   IMPRESSION: 1. Interstitial edema and pulmonary edema. 2. Bilateral pleural effusions and passive atelectasis. 3. Mild mediastinal adenopathy is likely reactive. 4. Right lower lobe pulmonary nodule is increased minimally over 3-1/2 year suggesting benign etiology. Recommend follow-up in 12 months.   Electronically Signed   By: Suzy Bouchard M.D.   On: 03/30/2014 16:53   ASSESSMENT AND PLAN  1.  Acute respiratory failure/Acute on chronic diastolic CHF:  -6.2X  overnight, -15.5L for admission.  Wt down to 278 lbs (308 on admission).  Maintaining sinus rhythm.  HR/BP reasonably well controlled.  She is now below previously recorded dry weights.  That said, she continues to have evidence of volume overload.  Cont IV diuresis @ current dose of lasix.  Can hopefully transition to PO in the next 24-48 hrs.  Renal fxn stable.  Cont bb/dilt.  Inhalers/steroid taper per pulm.  2.  PAF:  Maintaining sinus in setting of amio initiation and dccv.  Anticoagulation has been on hold in setting of hemoptysis, which has now resolved.  Heparin to be resumed with a plan to resume oral NOAC if she is able to tolerate heparin w/o recurrent hemoptysis.  We should consider taking this opportunity to switch her from pradaxa to eliquis given better bleeding profile and anemia req PRBC's this admission.  Will d/w Dr. Acie Fredrickson.  3.  Hypokalemia:  Supp.  4.  CKD III:  Creat stable.  Follow with ongoing diuresis.  5.  DMII:  Per primary team.  6.  Normocytic anemia:  Stable.  Signed, Rogelia Mire NP  Attending Note:   The patient was seen and examined.  Agree with assessment and plan as noted above.  Changes made to the above note as needed.  She has been started on heparin.  So far, no hemoptysis. Cor:  RR, lungs are clear  I think restarting PO Eliquis may offer some advantage over Pradaxa with respect to bleeding.  Her dose would be 5 mg bid.    Continue diuresis  Thayer Headings, Brooke Bonito., MD, [facility] 04/01/2014, 1:37 PM

## 2014-04-01 NOTE — Progress Notes (Signed)
[facility] for Heparin  Indication: atrial fibrillation  Allergies  Allergen Reactions  . Ace Inhibitors Cough  . Clindamycin Rash    Patient Measurements: Height: 5\' 5"  (165.1 cm) Weight: 278 lb 6.4 oz (126.281 kg) (bed scale) IBW/kg (Calculated) : 57 Heparin Dosing Weight: 91 kg  Vital Signs: Temp: 97.8 F (36.6 C) (05/06 0924) Temp src: Oral (05/06 0924) BP: 124/40 mmHg (05/06 0924) Pulse Rate: 73 (05/06 0924)  Labs:  Recent Labs  03/29/14 2359  03/30/14 0532 03/30/14 2145 03/31/14 0600 04/01/14 0535  HGB  --   < > 8.7*  --  8.8* 8.6*  HCT  --   --  29.4*  --  30.2* 29.3*  PLT  --   --  198  --  232 248  HEPARINUNFRC 0.38  --  0.30  --   --   --   CREATININE  --   < > 1.03 1.05 0.99 1.04  < > = values in this interval not displayed.  Estimated Creatinine Clearance: 69.2 ml/min (by C-G formula based on Cr of 1.04).  Medications:  Heparin 1200 units/hr  Assessment: Pt on anticoagulation for history of AFib.  She is s/p cardioversion 03/27/14.  She was taking Pradaxa prior to admission then was transitioned to Lovenox and eventually heparin gtt.  On 5/3 her hgb fell to 6.9 and she developed hemoptysis.  Heparin was held 5/3 until today, her hgb recovered and has not shown any other signs of bleeding.  Pharmacy consulted to resume heparin.   Goal of Therapy:  Heparin level 0.3-0.7 units/ml Monitor platelets by anticoagulation protocol: Yes   Plan:  -Withhold initial bolus given recent hemoptysis  -Resume heparin gtt at 1200 units/hr, based on previous therapeutic rate -Daily heparin level, CBC -Next HL at 1800 -Monitor for hemopytsis and other signs of bleeding -F/u plans for long term anticoagulation   Hughes Better, PharmD, BCPS Clinical Pharmacist Pager: [phone] 04/01/2014 12:54 PM

## 2014-04-02 LAB — HEPARIN LEVEL (UNFRACTIONATED): Heparin Unfractionated: 0.24 IU/mL — ABNORMAL LOW (ref 0.30–0.70)

## 2014-04-02 LAB — GLUCOSE, CAPILLARY
GLUCOSE-CAPILLARY: 78 mg/dL (ref 70–99)
GLUCOSE-CAPILLARY: 74 mg/dL (ref 70–99)
Glucose-Capillary: 170 mg/dL — ABNORMAL HIGH (ref 70–99)
Glucose-Capillary: 157 mg/dL — ABNORMAL HIGH (ref 70–99)

## 2014-04-02 LAB — CBC
HEMATOCRIT: 28.9 % — AB (ref 36.0–46.0)
Hemoglobin: 8.4 g/dL — ABNORMAL LOW (ref 12.0–15.0)
MCH: 25.4 pg — ABNORMAL LOW (ref 26.0–34.0)
MCHC: 29.1 g/dL — AB (ref 30.0–36.0)
MCV: 87.3 fL (ref 78.0–100.0)
PLATELETS: 252 10*3/uL (ref 150–400)
RBC: 3.31 MIL/uL — ABNORMAL LOW (ref 3.87–5.11)
RDW: 17.9 % — AB (ref 11.5–15.5)
WBC: 9.2 10*3/uL (ref 4.0–10.5)

## 2014-04-02 LAB — BASIC METABOLIC PANEL
BUN: 18 mg/dL (ref 6–23)
BUN: 19 mg/dL (ref 6–23)
CALCIUM: 8.8 mg/dL (ref 8.4–10.5)
CHLORIDE: 96 meq/L (ref 96–112)
CO2: 38 mEq/L — ABNORMAL HIGH (ref 19–32)
CO2: 36 meq/L — AB (ref 19–32)
Calcium: 8.9 mg/dL (ref 8.4–10.5)
Chloride: 95 mEq/L — ABNORMAL LOW (ref 96–112)
Creatinine, Ser: 1.09 mg/dL (ref 0.50–1.10)
Creatinine, Ser: 1.33 mg/dL — ABNORMAL HIGH (ref 0.50–1.10)
GFR calc Af Amer: 46 mL/min — ABNORMAL LOW (ref >90)
GFR calc non Af Amer: 40 mL/min — ABNORMAL LOW (ref >90)
GFR, EST AFRICAN AMERICAN: 59 mL/min — AB (ref >90)
GFR, EST NON AFRICAN AMERICAN: 51 mL/min — AB (ref >90)
GLUCOSE: 99 mg/dL (ref 70–99)
Glucose, Bld: 69 mg/dL — ABNORMAL LOW (ref 70–99)
POTASSIUM: 4.6 meq/L (ref 3.7–5.3)
Potassium: 3.5 mEq/L — ABNORMAL LOW (ref 3.7–5.3)
Sodium: 142 mEq/L (ref 137–147)
Sodium: 142 mEq/L (ref 137–147)

## 2014-04-02 LAB — PHOSPHORUS
Phosphorus: 3.2 mg/dL (ref 2.3–4.6)
Phosphorus: 2.9 mg/dL (ref 2.3–4.6)

## 2014-04-02 LAB — MAGNESIUM
Magnesium: 1.9 mg/dL (ref 1.5–2.5)
Magnesium: 1.7 mg/dL (ref 1.5–2.5)

## 2014-04-02 MED ORDER — POTASSIUM CHLORIDE CRYS ER 20 MEQ PO TBCR
40.0000 meq | EXTENDED_RELEASE_TABLET | Freq: Once | ORAL | Status: AC
  Administered 2014-04-02: 40 meq via ORAL
  Filled 2014-04-02: qty 2

## 2014-04-02 MED ORDER — FUROSEMIDE 40 MG PO TABS
40.0000 mg | ORAL_TABLET | Freq: Every day | ORAL | Status: DC
  Administered 2014-04-02 – 2014-04-03 (×2): 40 mg via ORAL
  Filled 2014-04-02 (×2): qty 1

## 2014-04-02 MED ORDER — AMIODARONE HCL 200 MG PO TABS: 200.0000 mg | ORAL_TABLET | Freq: Two times a day (BID) | ORAL | Status: DC

## 2014-04-02 MED ORDER — APIXABAN 5 MG PO TABS
5.0000 mg | ORAL_TABLET | Freq: Two times a day (BID) | ORAL | Status: DC
  Administered 2014-04-02 – 2014-04-03 (×3): 5 mg via ORAL
  Filled 2014-04-02 (×4): qty 1

## 2014-04-02 NOTE — Progress Notes (Signed)
No change in d/c plan. Awaiting stability per MD for return to [facility]- SNF level. Bed is currently available for patient at facility.  MD: please advise re: tentative d/c date.  Thanks!  Lorie Phenix. [place], [place]

## 2014-04-02 NOTE — Progress Notes (Signed)
Physical Therapy Treatment Patient Details Name: Paige Hardin MRN: [id number] DOB: Apr 12, 1946 Today's Date: 04/02/2014    History of Present Illness 68 year old woman with morbid obesity, severe COPD, lung nodules, DM-II, chronic diastolic HF (EF 60 % on 01/17/24), paroxysmal atrial fibrillation and s/p of cardioversion, who presents with worsening SOB and respiratory failure. Likely due to combinational for CHF and COPD exacerbation. Intubated in ED.  Pt extubated 4/29.    PT Comments    Pt progressing towards physical therapy goals. Was able to improve gait distance with less reported fatigue from pt. Sats dropped some during ambulation on 3L/min supplemental O2, and pt required ~2 minutes to recover back into the 90's each time. Pt anticipates d/c tomorrow morning. Will continue to progress per POC.   Follow Up Recommendations  SNF     Equipment Recommendations  None recommended by PT    Recommendations for Other Services       Precautions / Restrictions Precautions Precautions: Fall Restrictions Weight Bearing Restrictions: No    Mobility  Bed Mobility               General bed mobility comments: Pt sitting up in recliner upon PT arrival.  Transfers Overall transfer level: Needs assistance Equipment used: Rolling walker (2 wheeled) Transfers: Sit to/from Stand Sit to Stand: Min guard         General transfer comment: Pt able to power-up to full standing position with min guard assist. VC's for hand placement on seated surface for safety.   Ambulation/Gait Ambulation/Gait assistance: Min guard Ambulation Distance (Feet): 75 Feet Assistive device: Rolling walker (2 wheeled) Gait Pattern/deviations: Step-through pattern;Decreased stride length;Trunk flexed Gait velocity: Decreased Gait velocity interpretation: Below normal speed for age/gender General Gait Details: VC's for pursed-lip breathing throughout gait training. Pt on 3L/min supplemental O2. Pt required  a seated rest break for 2 minutes, 1/2 way through gait training.    Stairs            Wheelchair Mobility    Modified Rankin (Stroke Patients Only)       Balance Overall balance assessment: Needs assistance Sitting-balance support: Feet supported;No upper extremity supported Sitting balance-Leahy Scale: Good     Standing balance support: Bilateral upper extremity supported Standing balance-Leahy Scale: Fair                      Cognition Arousal/Alertness: Awake/alert Behavior During Therapy: WFL for tasks assessed/performed Overall Cognitive Status: Within Functional Limits for tasks assessed                      Exercises General Exercises - Lower Extremity Quad Sets: 15 reps Gluteal Sets: 15 reps Long Arc Quad: 15 reps;Strengthening Heel Slides: 15 reps;Strengthening Hip ABduction/ADduction: 15 reps Straight Leg Raises: 10 reps    General Comments        Pertinent Vitals/Pain Sats at 92% at rest, decreasing to 88% during ambulation. Was able to improve sats to 90-91% with pursed-lip breathing.     Home Living                      Prior Function            PT Goals (current goals can now be found in the care plan section) Acute Rehab PT Goals Patient Stated Goal: None stated.  PT Goal Formulation: With patient Time For Goal Achievement: 04/02/14 Potential to Achieve Goals: Good Progress towards PT goals:  Progressing toward goals    Frequency  Min 2X/week    PT Plan Current plan remains appropriate    Co-evaluation             End of Session Equipment Utilized During Treatment: Gait belt;Oxygen Activity Tolerance: Patient tolerated treatment well Patient left: in chair;with call bell/phone within reach     Time: 1120-1143 PT Time Calculation (min): 23 min  Charges:  $Gait Training: 8-22 mins $Therapeutic Exercise: 8-22 mins                    G Codes:      Jolyn Lent 2014/04/26, 3:42 PM  Jolyn Lent, PT, DPT Acute Rehabilitation Services Pager: [phone]

## 2014-04-02 NOTE — Progress Notes (Signed)
Patient Name: Paige Hardin Date of Encounter: 04/02/2014   Principal Problem:   Acute respiratory failure with hypoxia Active Problems:   HYPERLIPIDEMIA-MIXED   COPD with emphysema    Acute on chronic diastolic congestive heart failure   HTN (hypertension)   Chronic anticoagulation   Pulmonary HTN- pa 53 mmHg Echo 01/15/14   Morbid obesity- BMI 50   Poorly controlled diabetes mellitus   Chronic renal disease, stage III   COPD exacerbation   Atrial fibrillation with RVR- DCCV 03/27/14   Iron deficiency anemia-transfused 2 untis   Hemoptysis   SUBJECTIVE  Maintaining sinus rhythm.  Breathing continues to improve.  No further hemoptysis.  -1.9L overnight, -15.5L for admission.  Wt down to 278 lbs (308 on admission).  CURRENT MEDS . amiodarone  200 mg Oral BID  . antiseptic oral rinse  15 mL Mouth Rinse q12n4p  . ezetimibe  10 mg Oral QHS   And  . atorvastatin  20 mg Oral QHS  . calcium carbonate (dosed in mg elemental calcium)  500 mg of elemental calcium Oral Q breakfast  . chlorhexidine  15 mL Mouth Rinse BID  . cholecalciferol  2,000 Units Oral Daily  . diltiazem  180 mg Oral Daily  . furosemide  40 mg Intravenous BID  . insulin aspart  0-20 Units Subcutaneous TID WC  . insulin aspart  3 Units Subcutaneous TID WC  . insulin glargine  15 Units Subcutaneous BID  . ipratropium-albuterol  3 mL Nebulization QID  . levofloxacin  750 mg Oral Daily  . metoprolol  50 mg Oral BID  . pantoprazole  40 mg Oral BID AC  . potassium chloride  40 mEq Oral Daily  . predniSONE  10 mg Oral Q breakfast  . sodium chloride  3 mL Intravenous Q12H  . vitamin B-12  500 mcg Oral Daily    OBJECTIVE  Filed Vitals:   04/01/14 1927 04/01/14 2102 04/02/14 0631 04/02/14 0843  BP:  126/48 132/49   Pulse:  72 60   Temp:  97.9 F (36.6 C) 98 F (36.7 C)   TempSrc:  Oral Oral   Resp:  18 17   Height:      Weight:   280 lb 11.2 oz (127.325 kg)   SpO2: 91% 92% 97% 95%    Intake/Output Summary  (Last 24 hours) at 04/02/14 0922 Last data filed at 04/02/14 0641  Gross per 24 hour  Intake 1124.79 ml  Output   1700 ml  Net -575.21 ml   Filed Weights   03/31/14 0440 04/01/14 0537 04/02/14 0631  Weight: 280 lb 10.3 oz (127.3 kg) 278 lb 6.4 oz (126.281 kg) 280 lb 11.2 oz (127.325 kg)    PHYSICAL EXAM  General: Pleasant, NAD. Neuro: Alert and oriented X 3. Moves all extremities spontaneously. Psych: Normal affect. HEENT:  Normal  Neck: Supple without bruits.  JVP ~ 12 cm. Lungs:  Resp regular and unlabored, diminished throughout with occas exp wheeze and faint bibasilar crackles. Heart: RRR no s3, s4, 2/6 Syst murmur @ bilat USB. Abdomen: Soft, non-tender, non-distended, BS + x 4.  Extremities: No clubbing, cyanosis.  Still with 1+ bilat LEE. DP/PT/Radials 2+ and equal bilaterally.  Accessory Clinical Findings  CBC  Recent Labs  04/01/14 0535 04/02/14 0445  WBC 9.7 9.2  HGB 8.6* 8.4*  HCT 29.3* 28.9*  MCV 86.4 87.3  PLT 248 952   Basic Metabolic Panel  Recent Labs  04/01/14 1715 04/02/14 0445  NA 140  142  K 4.2 3.5*  CL 93* 95*  CO2 35* 38*  GLUCOSE 196* 99  BUN 20 18  CREATININE 1.15* 1.09  CALCIUM 9.2 8.8  MG 1.7 1.9  PHOS 2.9 3.2   Fasting Lipid Panel No results found for this basename: CHOL, HDL, LDLCALC, TRIG, CHOLHDL, LDLDIRECT,  in the last 72 hours TELE  rsr  Radiology/Studies  Ct Chest Wo Contrast  03/30/2014   CLINICAL DATA:  Hemoptysis.  Pulmonary nodule  EXAM: CT CHEST WITHOUT CONTRAST   IMPRESSION: 1. Interstitial edema and pulmonary edema. 2. Bilateral pleural effusions and passive atelectasis. 3. Mild mediastinal adenopathy is likely reactive. 4. Right lower lobe pulmonary nodule is increased minimally over 3-1/2 year suggesting benign etiology. Recommend follow-up in 12 months.   Electronically Signed   By: Suzy Bouchard M.D.   On: 03/30/2014 16:53   ASSESSMENT AND PLAN  1.  Acute respiratory failure/Acute on chronic diastolic  CHF:   -16 L for admission.  Wt down to 280.    We discussed a low salt  diet.  She was still eating lots of processed meats and salty foods.    2.  PAF:  Maintaining sinus in setting of amio initiation and dccv.  Anticoagulation was held in setting of hemoptysis, which has now resolved.  No further hemoptysis on heparin.  Will  start Eliquis 5 BID instead of Pradaxa - this may help with the hemoptysis issue.    3.  Hypokalemia:  Supp.  4.  CKD III:  Creat stable.  Follow with ongoing diuresis.  5.  DMII:  Per primary team.  6.  Normocytic anemia:  Stable.  Thayer Headings, Brooke Bonito., MD, [facility] 04/02/2014, 9:31 AM Office - [phone] Pager [phone]

## 2014-04-02 NOTE — Progress Notes (Signed)
PULMONARY / CRITICAL CARE MEDICINE   Name: Paige Hardin MRN: [id number] DOB: 07/24/1946    ADMISSION DATE:  03/23/2014 CONSULTATION DATE:  03/23/2014  REFERRING MD : ED  PRIMARY SERVICE: PCCM  CHIEF COMPLAINT: SOB  BRIEF PATIENT DESCRIPTION:    68 year old woman with morbid obesity, severe COPD, lung nodules, DM-II, chronic diastolic HF (EF 60 % on 4/69/62), paroxysmal atrial fibrillation and s/p of cardioversion, who presents with worsening SOB and respiratory failure. Likely due to combinational for CHF and COPD exacerbation. Intubated in ED.   SIGNIFICANT EVENTS / STUDIES:  ETT 4/27>>> 4/29 5/1 DCCV return to sr 5/4 recurrent hemoptysis. DC heparin 5/5 no hemoptysis since 5/4 o400, off heparin 5/4 ct chest>>1. Interstitial edema and pulmonary edema. 2. Bilateral pleural effusions and passive atelectasis. 3. Mild mediastinal adenopathy is likely reactive. 4. Right lower lobe pulmonary nodule is increased minimally over 3-1/2 year suggesting benign etiology.   LINES / TUBES:    CULTURES:  Blood culture 4/27> neg Sputum culture 4/27>>>  Tracheal aspirate 4/27>> neg Resp virus tracheal aspirate 4/27 >>> neg Urine culture 4/27 >>> neg Urine legionella 4/30 > neg Urine strep 4/30 > neg  ANTIBIOTICS:  Vancomycin 4/27>>> 4/30 Cefpime 4/27>>> 4/30 levaquin 4/30 >>5/1 changed to po>> 5/7  INTERVAL HISTORY/subjective Feels much better.  Denies SOB, hemoptysis.  Wants to wait until tomorrow for d/c back to SNF since she just started eliquis today.   VITAL SIGNS:   Temp:  [97.9 F (36.6 C)-98 F (36.7 C)] 98 F (36.7 C) (05/07 0631) Pulse Rate:  [60-72] 69 (05/07 0956) Resp:  [17-18] 17 (05/07 0631) BP: (125-132)/(44-49) 125/46 mmHg (05/07 0956) SpO2:  [91 %-97 %] 96 % (05/07 0956) Weight:  [280 lb 11.2 oz (127.325 kg)] 280 lb 11.2 oz (127.325 kg) (05/07 0631)  HEMODYNAMICS:   VENTILATOR SETTINGS:  N/A  INTAKE / OUTPUT: Intake/Output     05/06 0701 - 05/07 0700 05/07  0701 - 05/08 0700   P.O. 1200 240   I.V. (mL/kg) 164.8 (1.3)    Total Intake(mL/kg) 1364.8 (10.7) 240 (1.9)   Urine (mL/kg/hr) 1700 (0.6)    Total Output 1700     Net -335.2 +240        Emesis Occurrence 500 x     PHYSICAL EXAMINATION: General:  Obese female, NAD walking with PT  Neuro:  AAO, responding appropriately, MAE, gen weakness  HEENT:  WNL Cardiovascular:  s1s2 distant, regular, NSR on monitor  Lungs: decreased BS B/L, no hemoptysis Abdomen:  +BS, soft, NT, ND Musculoskeletal:  Able to move extremities voluntarily Extremities:  1+ B/L lower extremity edema Skin:  Intact   LABS:   PULMONARY No results found for this basename: PHART, PCO2, PCO2ART, PO2, PO2ART, HCO3, TCO2, O2SAT,  in the last 168 hours  CBC  Recent Labs Lab 03/31/14 0600 04/01/14 0535 04/02/14 0445  HGB 8.8* 8.6* 8.4*  HCT 30.2* 29.3* 28.9*  WBC 11.2* 9.7 9.2  PLT 232 248 252    COAGULATION  Recent Labs Lab 03/29/14 0907  INR 1.21    CARDIAC   No results found for this basename: TROPONINI,  in the last 168 hours  Recent Labs Lab 03/31/14 0600  PROBNP 2952.0*     CHEMISTRY  Recent Labs Lab 03/30/14 2145 03/31/14 0600 04/01/14 0535 04/01/14 1715 04/02/14 0445  NA 138 140 142 140 142  K 3.8 3.7 3.5* 4.2 3.5*  CL 91* 92* 97 93* 95*  CO2 38* 38* 39* 35* 38*  GLUCOSE 191* 116* 104* 196* 99  BUN 26* 23 21 20 18   CREATININE 1.05 0.99 1.04 1.15* 1.09  CALCIUM 9.2 9.2 9.0 9.2 8.8  MG 1.8 1.9 1.9 1.7 1.9  PHOS 3.1 3.1 3.5 2.9 3.2   Estimated Creatinine Clearance: 66.4 ml/min (by C-G formula based on Cr of 1.09).   LIVER  Recent Labs Lab 03/29/14 0907  INR 1.21     INFECTIOUS No results found for this basename: LATICACIDVEN, PROCALCITON,  in the last 168 hours   ENDOCRINE CBG (last 3)   Recent Labs  04/01/14 2135 04/02/14 0637 04/02/14 1047  GLUCAP 223* 78 170*      IMAGING x48h  No results found.  Intake/Output Summary (Last 24 hours) at  04/02/14 1139 Last data filed at 04/02/14 0900  Gross per 24 hour  Intake 1364.79 ml  Output   1500 ml  Net -135.21 ml   Filed Weights   03/31/14 0440 04/01/14 0537 04/02/14 0631  Weight: 280 lb 10.3 oz (127.3 kg) 278 lb 6.4 oz (126.281 kg) 280 lb 11.2 oz (127.325 kg)     ASSESSMENT / PLAN:  PULMONARY  A:  - Acute hypercapnic respiratory failure 2/2 to acute exacerbation of dHF (EF 60% TTE 02/15) with AECOPD likely contributing; extubated 04/29 - PAH, 53 mmHg  -hemoptysis - in setting anticoagulation.  Resolved 5/6.  P:  - Albuterol neb q2h prn - Duoneb qid - cont prednisone with taper  (10mg  daily until 5/9 then stop)  - Probably needs an outpt sleep f/u >  Has outpt f/u with pulmonary  - monitor for s/s bleeding on eliquis  - see cards    CARDIOVASCULAR  A:  - Hx of PAF - Afib since 4/29 am, cardioverted 5/1, now in NSR.  - Acute on chronic dHF P:  - s/p cardioversion 5/1  - continue amiodarone, metoprolol, and Cardizem PO per cards  - continue diuresis as bp, scr allow --changed back to PO daily lasix 5/7 - Follow up BMP/Mg/Phos BID, replete electrolytes as needed  - Continue eliqius 5mg  BID per cards, monitor for s/s bleeding  - ideally needs anticoagulation min 4-6 weeks   A:  - CKD3, Cr stable - Hyperkalemia, resolved -hypokalemia - mild P:  - lasix as above  - K supp as needed  - Monitor BMP    GASTROINTESTINAL  A:  - GERD  P:  - PPI  - heart healthy diet.   HEMATOLOGIC  A:  - Leukocytosis, resolved   - Chronic anemia, baseline 7-9;   P:  -eliquis as above  -trend cbc    INFECTIOUS  A:  - Leukocytosis, productive cough with Stempel sputum, no fever or chills; lactic acid 0.9 reassuring, PCT 0.4-->0.87 P:  - cont abx as above - d/c levaquin 5/7 (7 days total)   ENDOCRINE  CBG (last 3)  A:  - DM type 2, CBGs > 200, steroid likely contributing P:  - SSI resistant q4h, Lantus   - add meal coverage 5/6  -watch closely with steroids  taper  -?resume PO DM meds on d/c or cont lantus, SSI (would be better controlled, going to SNF)   NEUROLOGIC  A:  - Acute hypoxic encephalopathy 2/2 to acute respiratory failure, resolved  P:  - Monitor for change in neuro status  Global: Has diuresed well, -15L since admit.  No further hemoptysis.  Will monitor on eliquis and plan d/c 5/8 back to SNF if no further s/s bleeding.   Curt Bears  A Whiteheart, NP 04/02/2014  11:39 AM Pager: [phone] or [phone]   STAFF NOTE  She is ready for dc but because eliquis is new toher system today she made request to stay one more day in hospital.So aim dc 04/03/14 *Care during the described time interval was provided by me and/or other providers on the critical care team. I have reviewed this patient's available data, including medical history, events of note, physical examination and test results as part of my evaluation.  Dr. Brand Males, M.D., [facility].C.P Pulmonary and Critical Care Medicine Staff Physician [facility] Pager: [phone], If no answer or between  15:00h - 7:00h: call [phone]  04/02/2014 12:11 PM

## 2014-04-02 NOTE — Progress Notes (Signed)
Lipan for heparin Indication: atrial fibrillation  Allergies  Allergen Reactions  . Ace Inhibitors Cough  . Clindamycin Rash    Patient Measurements: Height: 5\' 5"  (165.1 cm) Weight: 278 lb 6.4 oz (126.281 kg) (bed scale) IBW/kg (Calculated) : 57 Heparin Dosing Weight: 91 kg  Vital Signs: Temp: 97.9 F (36.6 C) (05/06 2102) Temp src: Oral (05/06 2102) BP: 126/48 mmHg (05/06 2102) Pulse Rate: 72 (05/06 2102)  Labs:  Recent Labs  03/30/14 0532  03/31/14 0600 04/01/14 0535 04/01/14 1715 04/01/14 1857 04/02/14 0154  HGB 8.7*  --  8.8* 8.6*  --   --   --   HCT 29.4*  --  30.2* 29.3*  --   --   --   PLT 198  --  232 248  --   --   --   HEPARINUNFRC 0.30  --   --   --   --  0.21* 0.24*  CREATININE 1.03  < > 0.99 1.04 1.15*  --   --   < > = values in this interval not displayed.  Estimated Creatinine Clearance: 62.6 ml/min (by C-G formula based on Cr of 1.15).  Assessment: 68 yo female with Afib for heparin  Goal of Therapy:  Heparin level 0.3-0.7 units/ml Monitor platelets by anticoagulation protocol: Yes   Plan:  Increase Heparin 1550 units/hr Check heparin level in 8 hours.   Bronson Curb Rachid Parham 04/02/2014,4:37 AM

## 2014-04-02 NOTE — Discharge Instructions (Signed)
Information on my medicine - ELIQUIS® (apixaban) °Why was Eliquis® prescribed for you? °Eliquis® was prescribed for you to reduce the risk of a blood clot forming that can cause a stroke if you have a medical condition called atrial fibrillation (a type of irregular heartbeat). ° °What do You need to know about Eliquis® ? °Take your Eliquis® TWICE DAILY - one tablet in the morning and one tablet in the evening with or without food. If you have difficulty swallowing the tablet whole please discuss with your pharmacist how to take the medication safely. ° °Take Eliquis® exactly as prescribed by your doctor and DO NOT stop taking Eliquis® without talking to the doctor who prescribed the medication.  Stopping may increase your risk of developing a stroke.  Refill your prescription before you run out. ° °After discharge, you should have regular check-up appointments with your healthcare provider that is prescribing your Eliquis®.  In the future your dose may need to be changed if your kidney function or weight changes by a significant amount or as you get older. ° °What do you do if you miss a dose? °If you miss a dose, take it as soon as you remember on the same day and resume taking twice daily.  Do not take more than one dose of ELIQUIS at the same time to make up a missed dose. ° °Important Safety Information °A possible side effect of Eliquis® is bleeding. You should call your healthcare provider right away if you experience any of the following: °  Bleeding from an injury or your nose that does not stop. °  Unusual colored urine (red or dark brown) or unusual colored stools (red or black). °  Unusual bruising for unknown reasons. °  A serious fall or if you hit your head (even if there is no bleeding). ° °Some medicines may interact with Eliquis® and might increase your risk of bleeding or clotting while on Eliquis®. To help avoid this, consult your healthcare provider or pharmacist prior to using any new  prescription or non-prescription medications, including herbals, vitamins, non-steroidal anti-inflammatory drugs (NSAIDs) and supplements. ° °This website has more information on Eliquis® (apixaban): www.Eliquis.com. ° °

## 2014-04-03 DIAGNOSIS — J449 Chronic obstructive pulmonary disease, unspecified: Secondary | ICD-10-CM | POA: Diagnosis not present

## 2014-04-03 DIAGNOSIS — N183 Chronic kidney disease, stage 3 unspecified: Secondary | ICD-10-CM | POA: Diagnosis not present

## 2014-04-03 DIAGNOSIS — R4182 Altered mental status, unspecified: Secondary | ICD-10-CM | POA: Diagnosis not present

## 2014-04-03 DIAGNOSIS — D649 Anemia, unspecified: Secondary | ICD-10-CM | POA: Diagnosis not present

## 2014-04-03 DIAGNOSIS — I10 Essential (primary) hypertension: Secondary | ICD-10-CM | POA: Diagnosis not present

## 2014-04-03 DIAGNOSIS — J9600 Acute respiratory failure, unspecified whether with hypoxia or hypercapnia: Secondary | ICD-10-CM | POA: Diagnosis not present

## 2014-04-03 DIAGNOSIS — Z794 Long term (current) use of insulin: Secondary | ICD-10-CM | POA: Diagnosis not present

## 2014-04-03 DIAGNOSIS — E669 Obesity, unspecified: Secondary | ICD-10-CM | POA: Diagnosis not present

## 2014-04-03 DIAGNOSIS — Z5189 Encounter for other specified aftercare: Secondary | ICD-10-CM | POA: Diagnosis not present

## 2014-04-03 DIAGNOSIS — J4489 Other specified chronic obstructive pulmonary disease: Secondary | ICD-10-CM | POA: Diagnosis not present

## 2014-04-03 DIAGNOSIS — Z87891 Personal history of nicotine dependence: Secondary | ICD-10-CM | POA: Diagnosis not present

## 2014-04-03 DIAGNOSIS — K219 Gastro-esophageal reflux disease without esophagitis: Secondary | ICD-10-CM | POA: Diagnosis not present

## 2014-04-03 DIAGNOSIS — R0602 Shortness of breath: Secondary | ICD-10-CM | POA: Diagnosis not present

## 2014-04-03 DIAGNOSIS — J438 Other emphysema: Secondary | ICD-10-CM | POA: Diagnosis not present

## 2014-04-03 DIAGNOSIS — I2510 Atherosclerotic heart disease of native coronary artery without angina pectoris: Secondary | ICD-10-CM | POA: Diagnosis not present

## 2014-04-03 DIAGNOSIS — N289 Disorder of kidney and ureter, unspecified: Secondary | ICD-10-CM | POA: Diagnosis not present

## 2014-04-03 DIAGNOSIS — E785 Hyperlipidemia, unspecified: Secondary | ICD-10-CM | POA: Diagnosis not present

## 2014-04-03 DIAGNOSIS — E559 Vitamin D deficiency, unspecified: Secondary | ICD-10-CM | POA: Diagnosis not present

## 2014-04-03 DIAGNOSIS — J9620 Acute and chronic respiratory failure, unspecified whether with hypoxia or hypercapnia: Secondary | ICD-10-CM | POA: Diagnosis not present

## 2014-04-03 DIAGNOSIS — E119 Type 2 diabetes mellitus without complications: Secondary | ICD-10-CM | POA: Diagnosis not present

## 2014-04-03 DIAGNOSIS — I5033 Acute on chronic diastolic (congestive) heart failure: Secondary | ICD-10-CM | POA: Diagnosis not present

## 2014-04-03 DIAGNOSIS — D509 Iron deficiency anemia, unspecified: Secondary | ICD-10-CM | POA: Diagnosis not present

## 2014-04-03 DIAGNOSIS — R279 Unspecified lack of coordination: Secondary | ICD-10-CM | POA: Diagnosis not present

## 2014-04-03 DIAGNOSIS — K5900 Constipation, unspecified: Secondary | ICD-10-CM | POA: Diagnosis not present

## 2014-04-03 DIAGNOSIS — Z79899 Other long term (current) drug therapy: Secondary | ICD-10-CM | POA: Diagnosis not present

## 2014-04-03 DIAGNOSIS — I2789 Other specified pulmonary heart diseases: Secondary | ICD-10-CM | POA: Diagnosis not present

## 2014-04-03 DIAGNOSIS — J984 Other disorders of lung: Secondary | ICD-10-CM | POA: Diagnosis not present

## 2014-04-03 DIAGNOSIS — N189 Chronic kidney disease, unspecified: Secondary | ICD-10-CM | POA: Diagnosis not present

## 2014-04-03 DIAGNOSIS — D631 Anemia in chronic kidney disease: Secondary | ICD-10-CM | POA: Diagnosis not present

## 2014-04-03 DIAGNOSIS — Z9981 Dependence on supplemental oxygen: Secondary | ICD-10-CM | POA: Diagnosis not present

## 2014-04-03 DIAGNOSIS — I5032 Chronic diastolic (congestive) heart failure: Secondary | ICD-10-CM | POA: Diagnosis not present

## 2014-04-03 DIAGNOSIS — Z7901 Long term (current) use of anticoagulants: Secondary | ICD-10-CM | POA: Diagnosis not present

## 2014-04-03 DIAGNOSIS — R911 Solitary pulmonary nodule: Secondary | ICD-10-CM | POA: Diagnosis not present

## 2014-04-03 DIAGNOSIS — I129 Hypertensive chronic kidney disease with stage 1 through stage 4 chronic kidney disease, or unspecified chronic kidney disease: Secondary | ICD-10-CM | POA: Diagnosis not present

## 2014-04-03 DIAGNOSIS — I4891 Unspecified atrial fibrillation: Secondary | ICD-10-CM | POA: Diagnosis not present

## 2014-04-03 DIAGNOSIS — IMO0001 Reserved for inherently not codable concepts without codable children: Secondary | ICD-10-CM | POA: Diagnosis not present

## 2014-04-03 DIAGNOSIS — G8929 Other chronic pain: Secondary | ICD-10-CM | POA: Diagnosis not present

## 2014-04-03 DIAGNOSIS — E876 Hypokalemia: Secondary | ICD-10-CM | POA: Diagnosis not present

## 2014-04-03 DIAGNOSIS — I509 Heart failure, unspecified: Secondary | ICD-10-CM | POA: Diagnosis not present

## 2014-04-03 DIAGNOSIS — R52 Pain, unspecified: Secondary | ICD-10-CM | POA: Diagnosis not present

## 2014-04-03 DIAGNOSIS — M6281 Muscle weakness (generalized): Secondary | ICD-10-CM | POA: Diagnosis not present

## 2014-04-03 LAB — CBC
HEMATOCRIT: 28.9 % — AB (ref 36.0–46.0)
Hemoglobin: 8.5 g/dL — ABNORMAL LOW (ref 12.0–15.0)
MCH: 26 pg (ref 26.0–34.0)
MCHC: 29.4 g/dL — AB (ref 30.0–36.0)
MCV: 88.4 fL (ref 78.0–100.0)
PLATELETS: 248 10*3/uL (ref 150–400)
RBC: 3.27 MIL/uL — ABNORMAL LOW (ref 3.87–5.11)
RDW: 18.3 % — ABNORMAL HIGH (ref 11.5–15.5)
WBC: 7.9 10*3/uL (ref 4.0–10.5)

## 2014-04-03 LAB — GLUCOSE, CAPILLARY
GLUCOSE-CAPILLARY: 65 mg/dL — AB (ref 70–99)
GLUCOSE-CAPILLARY: 99 mg/dL (ref 70–99)
Glucose-Capillary: 137 mg/dL — ABNORMAL HIGH (ref 70–99)

## 2014-04-03 LAB — BASIC METABOLIC PANEL
BUN: 18 mg/dL (ref 6–23)
CO2: 36 mEq/L — ABNORMAL HIGH (ref 19–32)
Calcium: 8.8 mg/dL (ref 8.4–10.5)
Chloride: 98 mEq/L (ref 96–112)
Creatinine, Ser: 1.29 mg/dL — ABNORMAL HIGH (ref 0.50–1.10)
GFR calc Af Amer: 48 mL/min — ABNORMAL LOW (ref >90)
GFR calc non Af Amer: 42 mL/min — ABNORMAL LOW (ref >90)
GLUCOSE: 50 mg/dL — AB (ref 70–99)
POTASSIUM: 3.8 meq/L (ref 3.7–5.3)
Sodium: 143 mEq/L (ref 137–147)

## 2014-04-03 LAB — MAGNESIUM: Magnesium: 1.8 mg/dL (ref 1.5–2.5)

## 2014-04-03 LAB — PHOSPHORUS: PHOSPHORUS: 3.5 mg/dL (ref 2.3–4.6)

## 2014-04-03 MED ORDER — APIXABAN 5 MG PO TABS: 5.0000 mg | ORAL_TABLET | Freq: Two times a day (BID) | ORAL | Status: AC

## 2014-04-03 MED ORDER — INSULIN ASPART 100 UNIT/ML ~~LOC~~ SOLN: 0.0000 [IU] | Freq: Three times a day (TID) | SUBCUTANEOUS | Status: DC

## 2014-04-03 MED ORDER — INSULIN ASPART 100 UNIT/ML ~~LOC~~ SOLN: 3.0000 [IU] | Freq: Three times a day (TID) | SUBCUTANEOUS | Status: DC

## 2014-04-03 NOTE — Discharge Summary (Signed)
Physician Discharge Summary  Patient ID: Paige Hardin MRN: [id number] DOB/AGE: Jan 06, 1946 68 y.o.  Admit date: 03/23/2014 Discharge date: 04/03/2014  Problem List Principal Problem:   Acute respiratory failure with hypoxia Active Problems:   HYPERLIPIDEMIA-MIXED   COPD with emphysema    Acute on chronic diastolic congestive heart failure   HTN (hypertension)   Chronic anticoagulation   Pulmonary HTN- pa 53 mmHg Echo 01/15/14   Morbid obesity- BMI 50   Poorly controlled diabetes mellitus   Chronic renal disease, stage III   COPD exacerbation   Atrial fibrillation with RVR- DCCV 03/27/14   Iron deficiency anemia-transfused 2 untis   Hemoptysis  HPI: 68 y/o F, SNF resident with PMH of PAF, DM, GERD, Depression, HLD, DCHF, morbid obesity, chronic back pain & COPD who presented to [facility] ER via EMS on 3/31 with 1 day hx of worsening shortness of breath. Patient was treated with multiple albuterol treatments throughout the day without relief of symptoms. EMS was activated and treated patient with 125 mg of solumedrol, 2gm magnesium and placed the patient on CPAP. Saturations were in upper 80's on CPAP. She initially improved with non-invasive support. On arrival to [facility], she became obtunded and required bagging per EMS. Prior to change in mental status, EMS reported she stated she wanted to be intubated "if it was necessary". Patient was intubated in ER.  ER work up noted glucose of 466, Na 134, sr cr 1.06, lactate of 0.90, wbc 24.2, hgb 8.8 and platelets of 410. PCCM called for ICU admission  Hospital Course:  SIGNIFICANT EVENTS / STUDIES:  ETT 4/27>>> 4/29  5/1 DCCV return to sr  5/4 recurrent hemoptysis. DC heparin  5/5 no hemoptysis since 5/4 o400, off heparin  5/4 ct chest>>1. Interstitial edema and pulmonary edema. 2. Bilateral pleural effusions and passive atelectasis. 3. Mild mediastinal adenopathy is likely reactive. 4. Right lower lobe pulmonary nodule is increased minimally over 3-1/2  year suggesting benign etiology.  LINES / TUBES:  CULTURES:  Blood culture 4/27> neg Sputum culture 4/27>>>  Tracheal aspirate 4/27>> neg  Resp virus tracheal aspirate 4/27 >>> neg  Urine culture 4/27 >>> neg  Urine legionella 4/30 > neg  Urine strep 4/30 > neg  ANTIBIOTICS:  Vancomycin 4/27>>> 4/30  Cefpime 4/27>>> 4/30  levaquin 4/30 >>5/1 changed to po>> 5/7   ASSESSMENT / PLAN:  PULMONARY  A:  - Acute hypercapnic respiratory failure 2/2 to acute exacerbation of dHF (EF 60% TTE 02/15) with AECOPD likely contributing; extubated 04/29  - PAH, 53 mmHg  -hemoptysis - in setting anticoagulation. Resolved 5/6.  P:  - Albuterol neb q2h prn  - Duoneb qid  - cont prednisone with taper. Completed - Probably needs an outpt sleep f/u > Has outpt f/u with pulmonary  - monitor for s/s bleeding on eliquis  - see cards  CARDIOVASCULAR  A:  - Hx of PAF - Afib since 4/29 am, cardioverted 5/1, now in NSR.  - Acute on chronic dHF  P:  - s/p cardioversion 5/1  - continue amiodarone, metoprolol, and Cardizem PO per cards  - continue diuresis as bp, scr allow --changed back to PO daily lasix 5/7  - Follow up BMP/Mg/Phos BID, replete electrolytes as needed  - Continue eliqius 5mg  BID per cards, monitor for s/s bleeding  - ideally needs anticoagulation min 4-6 weeks  A:  - CKD3, Cr stable  - Hyperkalemia, resolved  -hypokalemia - mild  P:  - lasix as above  - K supp  as needed  - Monitor BMP  GASTROINTESTINAL  A:  - GERD  P:  - PPI  - heart healthy diet.  HEMATOLOGIC  A:  - Leukocytosis, resolved  - Chronic anemia, baseline 7-9;  P:  -eliquis as above  -trend cbc  INFECTIOUS  A:  - Leukocytosis, productive cough with Horrigan sputum, no fever or chills; lactic acid 0.9 reassuring, PCT 0.4-->0.87  P:  - cont abx as above - d/c levaquin 5/7 (7 days total)  ENDOCRINE  CBG (last 3)  A:  - DM type 2, CBGs > 200, steroid likely contributing  P:  - SSI resistant q4h, Lantus  -  add meal coverage 5/6  -watch closely with steroids taper  -?resume PO DM meds on d/c or cont lantus, SSI (would be better controlled, going to SNF)  NEUROLOGIC  A:  - Acute hypoxic encephalopathy 2/2 to acute respiratory failure, resolved  P:  - Monitor for change in neuro status  Global:  Has diuresed well, -15L since admit. No further hemoptysis. Will monitor on eliquis and plan d/c 5/8 back to SNF if no further s/s bleeding.        Labs at discharge Lab Results  Component Value Date   CREATININE 1.29* 04/03/2014   BUN 18 04/03/2014   NA 143 04/03/2014   K 3.8 04/03/2014   CL 98 04/03/2014   CO2 36* 04/03/2014   Lab Results  Component Value Date   WBC 7.9 04/03/2014   HGB 8.5* 04/03/2014   HCT 28.9* 04/03/2014   MCV 88.4 04/03/2014   PLT 248 04/03/2014   Lab Results  Component Value Date   ALT 24 03/23/2014   AST 26 03/23/2014   ALKPHOS 93 03/23/2014   BILITOT 0.9 03/23/2014   Lab Results  Component Value Date   INR 1.21 03/29/2014   INR 1.70* 03/23/2014   INR 1.27 02/24/2014   PROTIME 27.3 05/14/2009   PROTIME 20.2 04/29/2009    Current radiology studies No results found.  Disposition:  03-Skilled Nursing Facility  Discharge Orders   Future Appointments Provider Department Dept Phone   04/07/2014 3:00 PM Melvenia Needles, NP [facility] [phone]   04/16/2014 4:30 PM Larey Dresser, MD [facility] [phone]   Future Orders Complete By Expires   Discharge to SNF when bed available  As directed        Medication List    STOP taking these medications       ATIVAN 0.5 MG tablet  Generic drug:  LORazepam     dabigatran 150 MG Caps capsule  Commonly known as:  PRADAXA     feeding supplement (PRO-STAT SUGAR FREE 64) Liqd     guaiFENesin-codeine 100-10 MG/5ML syrup     lansoprazole 30 MG capsule  Commonly known as:  PREVACID      TAKE these medications       acetaminophen 325 MG tablet  Commonly known as:  TYLENOL  Take 325 mg by  mouth daily as needed for mild pain.     amiodarone 200 MG tablet  Commonly known as:  PACERONE  Take 1 tablet (200 mg total) by mouth 2 (two) times daily. Take 2 tabs (400 mg) twice daily for 7 days, then take 2 tabs (400 mg) once daily for 14 days then take 1 tab (200 mg) once daily thereafter     apixaban 5 MG Tabs tablet  Commonly known as:  ELIQUIS  Take 1 tablet (5 mg  total) by mouth 2 (two) times daily.     bisacodyl 10 MG suppository  Commonly known as:  DULCOLAX  Place 1 suppository (10 mg total) rectally daily as needed for moderate constipation.     budesonide-formoterol 80-4.5 MCG/ACT inhaler  Commonly known as:  SYMBICORT  Inhale 2 puffs into the lungs 2 (two) times daily.     Calcium 333-80-133-133 Tabs  Take 1 tablet by mouth daily.     calcium-vitamin D 500-200 MG-UNIT per tablet  Commonly known as:  OSCAL WITH D  Take 1 tablet by mouth daily with breakfast.     diltiazem 180 MG 24 hr capsule  Commonly known as:  CARDIZEM CD  Take 1 capsule (180 mg total) by mouth daily.     ezetimibe-simvastatin 10-40 MG per tablet  Commonly known as:  VYTORIN  Take 1 tablet by mouth at bedtime.     furosemide 40 MG tablet  Commonly known as:  LASIX  Take 40-80 mg by mouth See admin instructions. Take 40mg  evey other day alternating with 80mg  every other day     glyBURIDE micronized 3 MG tablet  Commonly known as:  GLYNASE  Take 3 mg by mouth daily.     insulin aspart 100 UNIT/ML injection  Commonly known as:  novoLOG  Inject 3 Units into the skin 3 (three) times daily with meals.     levalbuterol 0.63 MG/3ML nebulizer solution  Commonly known as:  XOPENEX  Take 3 mLs (0.63 mg total) by nebulization every 6 (six) hours as needed for wheezing or shortness of breath.     levalbuterol 45 MCG/ACT inhaler  Commonly known as:  XOPENEX HFA  Inhale 2 puffs into the lungs every 6 (six) hours as needed for wheezing.     Magnesium-Zinc 133.33-5 MG Tabs  Take 133 mg by mouth  daily.     metoprolol 50 MG tablet  Commonly known as:  LOPRESSOR  Take 1 tablet (50 mg total) by mouth 2 (two) times daily.     MULTIVITAMIN/IRON PO  Take 1 tablet by mouth daily.     omeprazole 20 MG capsule  Commonly known as:  PRILOSEC  Take 20 mg by mouth daily.     potassium chloride SA 20 MEQ tablet  Commonly known as:  K-DUR,KLOR-CON  Take 20 mEq by mouth daily.     sitaGLIPtin 100 MG tablet  Commonly known as:  JANUVIA  Take 100 mg by mouth daily.     sodium chloride 0.65 % Soln nasal spray  Commonly known as:  OCEAN  Place 1 spray into both nostrils as needed for congestion.     tiotropium 18 MCG inhalation capsule  Commonly known as:  SPIRIVA  Place 18 mcg into inhaler and inhale daily.     vitamin B-12 500 MCG tablet  Commonly known as:  CYANOCOBALAMIN  Take 500 mcg by mouth daily.     Vitamin C 500 MG Caps  Take 500 mg by mouth daily.     Vitamin D 2000 UNITS tablet  Take 2,000 Units by mouth daily.           Follow-up Information   Follow up with BURNETT,BRENT A, MD In 1 week.   Specialty:  Family Medicine   Contact information:   [address] [place] [address] [phone]       Follow up with Rexene Edison, NP On 04/07/2014. (3:00pm with Dr. Janifer Adie Nurse Practitioner )    Specialty:  Nurse  Practitioner   Contact information:   [place] [address] [phone]       Follow up with Loralie Champagne, MD On 04/16/2014. (4:30pm )    Specialty:  Cardiology   Contact information:   [address] [place] [address] [phone]        Discharged Condition: fair  Time spent on discharge greater than 40 minutes.  Vital signs at Discharge. Temp:  [97.9 F (36.6 C)-98.6 F (37 C)] 98.3 F (36.8 C) (05/08 0919) Pulse Rate:  [62-69] 63 (05/08 0919) Resp:  [18] 18 (05/08 0919) BP: (111-130)/(40-79) 122/47 mmHg (05/08 0919) SpO2:  [93 %-97 %] 97 % (05/08 0919) Weight:  [126.554  kg (279 lb)] 126.554 kg (279 lb) (05/08 0503) Office follow up Special Information or instructions. Per SNF. Signed: Richardson Landry Minor ACNP Maryanna Shape PCCM Pager [phone] till 3 pm If no answer page [phone] 04/03/2014, 9:32 AM    STAFF note  - very diuresis dependent. Doing well on 24h of elqiuis. DC today to SNF after PICC removed.REst per NP   Dr. Brand Males, M.D., [facility].C.P Pulmonary and Critical Care Medicine Staff Physician [facility] Pager: [phone], If no answer or between  15:00h - 7:00h: call [phone]  04/03/2014 11:31 AM

## 2014-04-03 NOTE — Progress Notes (Signed)
Hypoglycemic Event  CBG: 65  Treatment: 15 GM carbohydrate snack  Symptoms: Hungry  Follow-up CBG: Time 0645 CBG Result:99   Possible Reasons for Event: Inadequate meal intake  Comments/MD notified:     Viviane Semidey Aguayo Macrae Wiegman  Remember to initiate Hypoglycemia Order Set & complete

## 2014-04-03 NOTE — Progress Notes (Signed)
1350 Telephone report given to Gerald Stabs , [place]

## 2014-04-03 NOTE — Progress Notes (Signed)
Utilization Review Completed.  Pt does not meet for continued stay.  Plan to D/C to SNF today. [place] Fresh[facility] J. Clydene Laming, [place], [place], [employer] [phone].

## 2014-04-03 NOTE — Progress Notes (Signed)
Patient Name: Paige Hardin Date of Encounter: 04/03/2014   Principal Problem:   Acute respiratory failure with hypoxia Active Problems:   HYPERLIPIDEMIA-MIXED   COPD with emphysema    Acute on chronic diastolic congestive heart failure   HTN (hypertension)   Chronic anticoagulation   Pulmonary HTN- pa 53 mmHg Echo 01/15/14   Morbid obesity- BMI 50   Poorly controlled diabetes mellitus   Chronic renal disease, stage III   COPD exacerbation   Atrial fibrillation with RVR- DCCV 03/27/14   Iron deficiency anemia-transfused 2 untis   Hemoptysis   SUBJECTIVE  Maintaining sinus rhythm.  Breathing continues to improve.  No further hemoptysis.  -1.9L overnight, -15.5L for admission.  Wt down to 279 lbs (308 on admission).  CURRENT MEDS . amiodarone  200 mg Oral BID  . antiseptic oral rinse  15 mL Mouth Rinse q12n4p  . apixaban  5 mg Oral BID  . ezetimibe  10 mg Oral QHS   And  . atorvastatin  20 mg Oral QHS  . calcium carbonate (dosed in mg elemental calcium)  500 mg of elemental calcium Oral Q breakfast  . chlorhexidine  15 mL Mouth Rinse BID  . cholecalciferol  2,000 Units Oral Daily  . diltiazem  180 mg Oral Daily  . furosemide  40 mg Oral Daily  . insulin aspart  0-20 Units Subcutaneous TID WC  . insulin aspart  3 Units Subcutaneous TID WC  . insulin glargine  15 Units Subcutaneous BID  . ipratropium-albuterol  3 mL Nebulization QID  . metoprolol  50 mg Oral BID  . pantoprazole  40 mg Oral BID AC  . potassium chloride  40 mEq Oral Daily  . predniSONE  10 mg Oral Q breakfast  . sodium chloride  3 mL Intravenous Q12H  . vitamin B-12  500 mcg Oral Daily    OBJECTIVE  Filed Vitals:   04/02/14 2101 04/02/14 2150 04/03/14 0503 04/03/14 0919  BP:  130/79 127/59 122/47  Pulse:  69 62 63  Temp:  98.3 F (36.8 C) 97.9 F (36.6 C) 98.3 F (36.8 C)  TempSrc:  Oral Oral Oral  Resp:  18 18 18   Height:      Weight:   279 lb (126.554 kg)   SpO2: 96% 95% 95% 97%     Intake/Output Summary (Last 24 hours) at 04/03/14 0956 Last data filed at 04/03/14 0700  Gross per 24 hour  Intake   1080 ml  Output   2450 ml  Net  -1370 ml   Filed Weights   04/01/14 0537 04/02/14 0631 04/03/14 0503  Weight: 278 lb 6.4 oz (126.281 kg) 280 lb 11.2 oz (127.325 kg) 279 lb (126.554 kg)    PHYSICAL EXAM  General: Pleasant, NAD. Neuro: Alert and oriented X 3. Moves all extremities spontaneously. Psych: Normal affect. HEENT:  Normal  Neck: Supple without bruits.  JVP ~ 12 cm. Lungs:  Resp regular and unlabored, diminished throughout with occas exp wheeze and faint bibasilar crackles. Heart: RRR no s3, s4, 2/6 Syst murmur @ bilat USB. Abdomen: Soft, non-tender, non-distended, BS + x 4.  Extremities: No clubbing, cyanosis.  Still with 1+ bilat LEE. DP/PT/Radials 2+ and equal bilaterally.  Accessory Clinical Findings  CBC  Recent Labs  04/02/14 0445 04/03/14 0440  WBC 9.2 7.9  HGB 8.4* 8.5*  HCT 28.9* 28.9*  MCV 87.3 88.4  PLT 252 355   Basic Metabolic Panel  Recent Labs  04/02/14 1700 04/03/14 0440  NA 142 143  K 4.6 3.8  CL 96 98  CO2 36* 36*  GLUCOSE 69* 50*  BUN 19 18  CREATININE 1.33* 1.29*  CALCIUM 8.9 8.8  MG 1.7 1.8  PHOS 2.9 3.5   Fasting Lipid Panel No results found for this basename: CHOL, HDL, LDLCALC, TRIG, CHOLHDL, LDLDIRECT,  in the last 72 hours TELE  rsr  Radiology/Studies  Ct Chest Wo Contrast  03/30/2014   CLINICAL DATA:  Hemoptysis.  Pulmonary nodule  EXAM: CT CHEST WITHOUT CONTRAST   IMPRESSION: 1. Interstitial edema and pulmonary edema. 2. Bilateral pleural effusions and passive atelectasis. 3. Mild mediastinal adenopathy is likely reactive. 4. Right lower lobe pulmonary nodule is increased minimally over 3-1/2 year suggesting benign etiology. Recommend follow-up in 12 months.   Electronically Signed   By: Suzy Bouchard M.D.   On: 03/30/2014 16:53   ASSESSMENT AND PLAN  1.  Acute respiratory failure/Acute on  chronic diastolic CHF:   -16 L for admission.  Wt down to 279.    We discussed a low salt  diet.  She was still eating lots of processed meats and salty foods.    2.  PAF:  Maintaining sinus in setting of amio initiation and dccv.  Anticoagulation was held in setting of hemoptysis, which has now resolved.  No further hemoptysis on heparin. On t Eliquis 5 BID   3.  Hypokalemia:  Supp.  4.  CKD III:  Creat stable.  Follow with ongoing diuresis.  5.  DMII:  Per primary team.  6.  Normocytic anemia:  Stable.   She will see Dr. Aundra Dubin in the office in several weeks.    Thayer Headings, Brooke Bonito., MD, [facility] 04/03/2014, 10:01 AM Office - [phone] Pager [phone]

## 2014-04-03 NOTE — Progress Notes (Signed)
1500 Transported to [employer] by [employer]

## 2014-04-06 DIAGNOSIS — J449 Chronic obstructive pulmonary disease, unspecified: Secondary | ICD-10-CM | POA: Diagnosis not present

## 2014-04-06 DIAGNOSIS — D649 Anemia, unspecified: Secondary | ICD-10-CM | POA: Diagnosis not present

## 2014-04-06 DIAGNOSIS — I509 Heart failure, unspecified: Secondary | ICD-10-CM | POA: Diagnosis not present

## 2014-04-07 ENCOUNTER — Ambulatory Visit: Payer: Medicare Other | Admitting: Pulmonary Disease

## 2014-04-07 ENCOUNTER — Inpatient Hospital Stay: Payer: Medicare Other | Admitting: Pulmonary Disease

## 2014-04-07 ENCOUNTER — Ambulatory Visit (INDEPENDENT_AMBULATORY_CARE_PROVIDER_SITE_OTHER): Payer: Medicare Other | Admitting: Adult Health

## 2014-04-07 ENCOUNTER — Encounter: Payer: Self-pay | Admitting: Adult Health

## 2014-04-07 VITALS — BP 124/58 | HR 59 | Temp 97.7°F | Ht 65.0 in | Wt 287.6 lb

## 2014-04-07 DIAGNOSIS — I509 Heart failure, unspecified: Secondary | ICD-10-CM

## 2014-04-07 DIAGNOSIS — J438 Other emphysema: Secondary | ICD-10-CM

## 2014-04-07 DIAGNOSIS — J439 Emphysema, unspecified: Secondary | ICD-10-CM

## 2014-04-07 DIAGNOSIS — I5033 Acute on chronic diastolic (congestive) heart failure: Secondary | ICD-10-CM | POA: Diagnosis not present

## 2014-04-07 DIAGNOSIS — I272 Pulmonary hypertension, unspecified: Secondary | ICD-10-CM

## 2014-04-07 DIAGNOSIS — I2789 Other specified pulmonary heart diseases: Secondary | ICD-10-CM | POA: Diagnosis not present

## 2014-04-07 NOTE — Assessment & Plan Note (Signed)
Multiple readmissions with decompensation c/by Atrial Fib with RVR Improved w/ O2 and diuretics  Will look at sleep study in future ? OHS w/ hypercarbia + COPD   Plan  Cont on current regimen  follow up with cards as planned

## 2014-04-07 NOTE — Patient Instructions (Signed)
Increased Symbicort 160/4.64mcg 2 puffs twice daily, rinse after use. Continue on Spiriva 1 puff daily. Continue on oxygen at 3 L Followup with Dr. Gwenette Greet in 2 weeks and as needed Please contact office for sooner follow up if symptoms do not improve or worsen or seek emergency care

## 2014-04-07 NOTE — Assessment & Plan Note (Signed)
Recurrent exacerbation with hypoxia and hypercarbia s/p VDRF x 2 in last 4 months  Advised to continue on inhlaers and o2.   Plan  Increased Symbicort 160/4.32mcg 2 puffs twice daily, rinse after use. Continue on Spiriva 1 puff daily. Continue on oxygen at 3 L Followup with Dr. Gwenette Greet in 2 weeks and as needed Please contact office for sooner follow up if symptoms do not improve or worsen or seek emergency care

## 2014-04-07 NOTE — Assessment & Plan Note (Signed)
Consider OP sleep study on return

## 2014-04-07 NOTE — Progress Notes (Signed)
Subjective:    Patient ID: Paige Hardin, female    DOB: Aug 15, 1946, 68 y.o.   MRN: [id number]  HPI 68 yo female with known hx of COPD   04/07/2014 [facility] follow up  Patient presents for a post hospital followup. Patient was admitted April 27 through May 8 for Acute Hypercarbic/Hypoxic Respiratory Failure  secondary to COPD, exacerbation, and acute on chronic diastolic congestive heart failure decompensation. She did require mechanical ventilation. Area. She was extubated on April 29. She was treated with IV antibiotics, steroids, and nebulized bronchodilators. Patient did have , hemoptysis that was monitored closely as she is on Eliquis . This resolved. Patient was discharged on a prednisone taper. Patient was also recommended for outpatient sleep study for possible underlying sleep apnea. 2-D echo showed ejection fracture of 60%., PAP 53, diastolic dysfxn  CT chest showed interstitial edema, and pulmonary edema with bilateral pleural effusions and passive atelectasis. Patient did have atrial fibrillation with RVR and required cardioversion on may first. She was seen by cardiology and continued on her cardiac regimen, along with anticoagulation. Pt remains at rehab with plans for assisted living.  Now on Oxygen full time .   Since discharge reports dyspnea is improving but still not at baseline.  also reports some sinus drainage (clear) and PND.  denies f/c/s, hemoptysis, wheezing, cough, tightness, nausea, vomiting.   >Of note, patient was also admitted , 2/18, for a COPD exacerbation, with acute on chronic diastolic heart failure. Also, during this admission. She required cardioversion for atrial fibrillation with RVR. >3/2 readmittedfor Atrial Fib w/ RVR   >readmitted 02/24/14 for COPD exacerb/Atrial fib , VDRF.    Review of Systems Constitutional:   No  weight loss, night sweats,  Fevers, chills,  +fatigue, or  lassitude.  HEENT:   No headaches,  Difficulty swallowing,   Tooth/dental problems, or  Sore throat,                No sneezing, itching, ear ache, nasal congestion, post nasal drip,   CV:  No chest pain,  Orthopnea, PND,anasarca, dizziness, palpitations, syncope.   GI  No heartburn, indigestion, abdominal pain, nausea, vomiting, diarrhea, change in bowel habits, loss of appetite, bloody stools.   Resp:    No chest wall deformity  Skin: no rash or lesions.  GU: no dysuria, change in color of urine, no urgency or frequency.  No flank pain, no hematuria   MS:  No joint pain or swelling.  No decreased range of motion.  No back pain.  Psych:  No change in mood or affect. No depression or anxiety.  No memory loss.         Objective:   Physical Exam GEN: A/Ox3; pleasant , NAD, morbidly obese in wheelchair   HEENT:  /AT,  EACs-clear, TMs-wnl, NOSE-clear, THROAT-clear, no lesions, no postnasal drip or exudate noted.   NECK:  Supple w/ fair ROM; no JVD; normal carotid impulses w/o bruits; no thyromegaly or nodules palpated; no lymphadenopathy.  RESP : Diminshed BS in bases , no accessory muscle use, no dullness to percussion  CARD:  RRR, no m/r/g  , 1+ peripheral edema, pulses intact, no cyanosis or clubbing.  GI:   Soft & nt; nml bowel sounds; no organomegaly or masses detected.  Musco: Warm bil, no deformities or joint swelling noted.   Neuro: alert, no focal deficits noted.    Skin: Warm, no lesions or rashes         Assessment &  Plan:

## 2014-04-08 NOTE — Progress Notes (Signed)
Ov reviewed, and agree with plans as outlined.

## 2014-04-12 NOTE — Clinical Social Work Psychosocial (Addendum)
Clinical Social Work Department BRIEF PSYCHOSOCIAL ASSESSMENT 03/30/2014  Patient:  AMEN, DARGIS     Account Number:  [id number]     Admit date:  03/23/2014  Clinical Social Worker:  Iona Coach  Date/Time:  03/30/2014 02:40 PM  Referred by:  Physician  Date Referred:  03/29/2014 Referred for  Other - See comment   Other Referral:   Return to SNF   Interview type:  Other - See comment Other interview type:   Patient and daughter [name]    PSYCHOSOCIAL DATA Living Status:  FACILITY Admitted from facility:  [facility] Level of care:  [place] Primary support name:  Jenny Reichmann  [phone]  [phone] Primary support relationship to patient:  CHILD, ADULT Degree of support available:   Strong support    CURRENT CONCERNS Current Concerns  Other - See comment   Other Concerns:   Return to SNF    SOCIAL WORK ASSESSMENT / PLAN 68 year old female- resident of [facility]- SNF level of care.  CSW met with patient and her daughter [name]- plan is for return to [facility] when medically stable per MD. They are very satisifed with the care she is receiving there.  CSW spoke with [place]- admissions at [facility]. The facilitly will accept patient back when medically stable. Fl2 placed on chart for MD's signature.   Assessment/plan status:  Psychosocial Support/Ongoing Assessment of Needs Other assessment/ plan:   Information/referral to community resources:   None at this time    PATIENT'S/FAMILY'S RESPONSE TO PLAN OF CARE: Patient is alert and oriented; very friendly and open with CSW during visit. She states that she is pleased with her care at [facility] and wants to return there when medically stable.  Daughter is also supportive and agreeable with this plan. CSW will support patient/family and assist with d/c back to SNF.

## 2014-04-12 NOTE — Progress Notes (Signed)
OK per MD for d/c today back to [facility] via EMS. Ok per patient, daughter and SNF. Nursing notified to call report. No further CSW needs identified. CSW signing off.  Lorie Phenix. [place], [place]

## 2014-04-16 ENCOUNTER — Encounter: Payer: Medicare Other | Admitting: Cardiology

## 2014-04-16 DIAGNOSIS — I509 Heart failure, unspecified: Secondary | ICD-10-CM | POA: Diagnosis not present

## 2014-04-21 ENCOUNTER — Ambulatory Visit (INDEPENDENT_AMBULATORY_CARE_PROVIDER_SITE_OTHER)
Admission: RE | Admit: 2014-04-21 | Discharge: 2014-04-21 | Disposition: A | Discharge status: Home / Self Care | Payer: Medicare Other | Source: Ambulatory Visit | Attending: Adult Health | Admitting: Adult Health

## 2014-04-21 ENCOUNTER — Ambulatory Visit (INDEPENDENT_AMBULATORY_CARE_PROVIDER_SITE_OTHER): Payer: Medicare Other | Admitting: Adult Health

## 2014-04-21 ENCOUNTER — Encounter: Payer: Self-pay | Admitting: Adult Health

## 2014-04-21 ENCOUNTER — Telehealth: Payer: Self-pay | Admitting: Pulmonary Disease

## 2014-04-21 VITALS — BP 118/64 | HR 70 | Temp 97.8°F | Ht 65.0 in | Wt 295.2 lb

## 2014-04-21 DIAGNOSIS — I5033 Acute on chronic diastolic (congestive) heart failure: Secondary | ICD-10-CM

## 2014-04-21 DIAGNOSIS — J438 Other emphysema: Secondary | ICD-10-CM

## 2014-04-21 DIAGNOSIS — J439 Emphysema, unspecified: Secondary | ICD-10-CM

## 2014-04-21 DIAGNOSIS — I272 Pulmonary hypertension, unspecified: Secondary | ICD-10-CM

## 2014-04-21 DIAGNOSIS — I2789 Other specified pulmonary heart diseases: Secondary | ICD-10-CM | POA: Diagnosis not present

## 2014-04-21 DIAGNOSIS — I509 Heart failure, unspecified: Secondary | ICD-10-CM | POA: Diagnosis not present

## 2014-04-21 NOTE — Progress Notes (Signed)
Subjective:    Patient ID: ATLAS KUC, female    DOB: 12-Sep-1946, 68 y.o.   MRN: [id number]  HPI  68 yo female with known hx of COPD   04/07/14  [facility] follow up  Patient presents for a post hospital followup. Patient was admitted April 27 through May 8 for Acute Hypercarbic/Hypoxic Respiratory Failure  secondary to COPD, exacerbation, and acute on chronic diastolic congestive heart failure decompensation. She did require mechanical ventilation. Area. She was extubated on April 29. She was treated with IV antibiotics, steroids, and nebulized bronchodilators. Patient did have , hemoptysis that was monitored closely as she is on Eliquis . This resolved. Patient was discharged on a prednisone taper. Patient was also recommended for outpatient sleep study for possible underlying sleep apnea. 2-D echo showed ejection fracture of 60%., PAP 53, diastolic dysfxn  CT chest showed interstitial edema, and pulmonary edema with bilateral pleural effusions and passive atelectasis. Patient did have atrial fibrillation with RVR and required cardioversion on may first. She was seen by cardiology and continued on her cardiac regimen, along with anticoagulation. Pt remains at rehab with plans for assisted living.  Now on Oxygen full time .   Since discharge reports dyspnea is improving but still not at baseline.  also reports some sinus drainage (clear) and PND.  denies f/c/s, hemoptysis, wheezing, cough, tightness, nausea, vomiting.   >Of note, patient was also admitted , 2/18, for a COPD exacerbation, with acute on chronic diastolic heart failure. Also, during this admission. She required cardioversion for atrial fibrillation with RVR. >3/2 readmittedfor Atrial Fib w/ RVR   >readmitted 02/24/14 for COPD exacerb/Atrial fib , VDRF.  >>increase symbicort 160   04/21/2014 Follow up COPD /Diastolic CHF  Patient returns for a two-week followup Reports some increased dyspnea, congestion,  Esp w/ lying down  x3 days.  Wt is trending up and legs are more swollen Has ov with cards on 04/23/14 . Wt up 8lbs  Denies cough, chest pain, hemoptysis, f/c/s, nausea, discolored mucus, or vomiting. Currently on lasix 40/80 alternating.  Remains at rehab center. Says she had labs done there yesterday- results were requested.  With sister today .         Review of Systems  Constitutional:   No  weight loss, night sweats,  Fevers, chills,  +fatigue, or  lassitude.  HEENT:   No headaches,  Difficulty swallowing,  Tooth/dental problems, or  Sore throat,                No sneezing, itching, ear ache, nasal congestion, post nasal drip,   CV:  No chest pain,  Orthopnea, PND,anasarca, dizziness, palpitations, syncope.   GI  No heartburn, indigestion, abdominal pain, nausea, vomiting, diarrhea, change in bowel habits, loss of appetite, bloody stools.   Resp:    No chest wall deformity  Skin: no rash or lesions.  GU: no dysuria, change in color of urine, no urgency or frequency.  No flank pain, no hematuria   MS:  No joint pain or swelling.  No decreased range of motion.  No back pain.  Psych:  No change in mood or affect. No depression or anxiety.  No memory loss.         Objective:   Physical Exam  GEN: A/Ox3; pleasant , NAD, morbidly obese in wheelchair   HEENT:  [place]/AT,  EACs-clear, TMs-wnl, NOSE-clear, THROAT-clear, no lesions, no postnasal drip or exudate noted.   NECK:  Supple w/ fair ROM; no JVD;  normal carotid impulses w/o bruits; no thyromegaly or nodules palpated; no lymphadenopathy.  RESP : Diminshed BS in bases , no accessory muscle use, no dullness to percussion  CARD:  RRR, no m/r/g  , 1+ peripheral edema, pulses intact, no cyanosis or clubbing.  GI:   Soft & nt; nml bowel sounds; no organomegaly or masses detected.  Musco: Warm bil, no deformities or joint swelling noted.   Neuro: alert, no focal deficits noted.    Skin: Warm, no lesions or rashes          Assessment & Plan:

## 2014-04-21 NOTE — Assessment & Plan Note (Signed)
Recurrent exacerbations w/ multiple admission  Appears to be retaining fluid again Will increase lasix until seen by cardiology .  May benefit from aldactone in future as bicarb has been elevated in past   Plan  ncrease Furosemide 80mg  daily .  Follow up with Cardiology this week as planned .  Legs elevated.  Low salt diet.  Chest xray today

## 2014-04-21 NOTE — Telephone Encounter (Signed)
Spoke with Julianne. Advised her that per TP, the pt is to take 80mg  daily. Per Lorayne Bender, the pt is currently on 160mg  of Lasix daily. Would like clarification on what dosage pt should be on.  TP - please advise. Thanks.

## 2014-04-21 NOTE — Assessment & Plan Note (Signed)
Needs OP sleep study ,  Will check to see if able to do while in rehab center .

## 2014-04-21 NOTE — Patient Instructions (Addendum)
Increase Furosemide 80mg  daily .  Follow up with Cardiology this week as planned .  Legs elevated.  Low salt diet.  Chest xray today  Continue on Symbicort 160/4.17mcg 2 puffs twice daily, rinse after use. Continue on Spiriva 1 puff daily. Continue on oxygen at 3 L Will be back in touch regarding sleep study  Followup with Dr. Gwenette Greet in 4 weeks and as needed Please contact office for sooner follow up if symptoms do not improve or worsen or seek emergency care

## 2014-04-21 NOTE — Progress Notes (Signed)
Agree with note per NP Parrett.   The pt is having recurrent exacerbations of her diastolic heart failure which are causing hospitalization.  I do not think this is due to her moderate copd, and lungs with adequate airflow with no bronchospasm.  Agree with increasing lasix until she can get back to cardiology who is managing her fluid balance.  Will discuss a possible sleep study with her at next visit with me which is upcoming.

## 2014-04-21 NOTE — Telephone Encounter (Signed)
[name] is returning call.  Would like an answer now, please.  Paige Hardin

## 2014-04-21 NOTE — Assessment & Plan Note (Addendum)
Compensated does not appear to be in flare  Has hx of hypercarbic RF , if not able to have OP sleep study could look at nocturnal BIPAP  Plan  Continue on Symbicort 160/4.79mcg 2 puffs twice daily, rinse after use. Continue on Spiriva 1 puff daily. Followup with Dr. Gwenette Greet in 4 weeks and as needed Please contact office for sooner follow up if symptoms do not improve or worsen or seek emergency care

## 2014-04-21 NOTE — Telephone Encounter (Signed)
Called [facility] to speak with Julianne, she is gone for the day.  Asked to be transferred to pt's evening nurse but after waiting, was told that the call could not be transferred.  Will call Julianne in the morning.  Pt had stated that she is taking 40mg  alternating with 80mg ; was taking 160mg  BID x2 days and then returned to her normal dosing.  Did review pt's MAG from [facility] while she was in the office, but did not see that she is taking 160mg  QD.  Will discuss w/ Julianne when I speak with her in the morning.  Will route message to my inbox.

## 2014-04-22 NOTE — Telephone Encounter (Signed)
Called spoke with Julianne, verified that pt takes 160mg  lasix once daily.  Apologized to [facility] for this miscommunication - pt is to continue her same dose of lasix.  Med list updated.  Nothing further needed; will sign off.

## 2014-04-23 ENCOUNTER — Encounter: Payer: Self-pay | Admitting: Physician Assistant

## 2014-04-23 ENCOUNTER — Ambulatory Visit (INDEPENDENT_AMBULATORY_CARE_PROVIDER_SITE_OTHER): Payer: Medicare Other | Admitting: Physician Assistant

## 2014-04-23 VITALS — BP 130/60 | HR 68 | Ht 65.0 in | Wt 293.0 lb

## 2014-04-23 DIAGNOSIS — I4891 Unspecified atrial fibrillation: Secondary | ICD-10-CM

## 2014-04-23 DIAGNOSIS — N183 Chronic kidney disease, stage 3 unspecified: Secondary | ICD-10-CM | POA: Diagnosis not present

## 2014-04-23 DIAGNOSIS — J438 Other emphysema: Secondary | ICD-10-CM

## 2014-04-23 DIAGNOSIS — I10 Essential (primary) hypertension: Secondary | ICD-10-CM | POA: Diagnosis not present

## 2014-04-23 DIAGNOSIS — E785 Hyperlipidemia, unspecified: Secondary | ICD-10-CM

## 2014-04-23 DIAGNOSIS — I5033 Acute on chronic diastolic (congestive) heart failure: Secondary | ICD-10-CM

## 2014-04-23 DIAGNOSIS — J439 Emphysema, unspecified: Secondary | ICD-10-CM

## 2014-04-23 DIAGNOSIS — I5032 Chronic diastolic (congestive) heart failure: Secondary | ICD-10-CM | POA: Diagnosis not present

## 2014-04-23 DIAGNOSIS — D649 Anemia, unspecified: Secondary | ICD-10-CM

## 2014-04-23 DIAGNOSIS — D509 Iron deficiency anemia, unspecified: Secondary | ICD-10-CM

## 2014-04-23 DIAGNOSIS — I480 Paroxysmal atrial fibrillation: Secondary | ICD-10-CM

## 2014-04-23 LAB — BASIC METABOLIC PANEL
BUN: 22 mg/dL (ref 6–23)
CHLORIDE: 93 meq/L — AB (ref 96–112)
CO2: 35 mEq/L — ABNORMAL HIGH (ref 19–32)
Calcium: 9.1 mg/dL (ref 8.4–10.5)
Creatinine, Ser: 1.3 mg/dL — ABNORMAL HIGH (ref 0.4–1.2)
GFR: 42.43 mL/min — ABNORMAL LOW (ref >60.00)
Glucose, Bld: 133 mg/dL — ABNORMAL HIGH (ref 70–99)
Potassium: 4 mEq/L (ref 3.5–5.1)
Sodium: 137 mEq/L (ref 135–145)

## 2014-04-23 MED ORDER — METOLAZONE 5 MG PO TABS: 5.0000 mg | ORAL_TABLET | ORAL | Status: DC

## 2014-04-23 MED ORDER — POTASSIUM CHLORIDE CRYS ER 20 MEQ PO TBCR: 20.0000 meq | EXTENDED_RELEASE_TABLET | Freq: Two times a day (BID) | ORAL | Status: DC

## 2014-04-23 MED ORDER — AMIODARONE HCL 200 MG PO TABS: 200.0000 mg | ORAL_TABLET | Freq: Every day | ORAL | Status: AC

## 2014-04-23 MED ORDER — FUROSEMIDE 80 MG PO TABS: 160.0000 mg | ORAL_TABLET | Freq: Two times a day (BID) | ORAL | Status: DC

## 2014-04-23 NOTE — Progress Notes (Signed)
Cardiology Office Note   Date:  04/23/2014   ID:  Tanieka, Pownall 1946-08-24, MRN [id number]  PCP:  Stephens Shire, MD  Cardiologist:  Dr. Loralie Champagne   Electrophysiologist:  Dr. Cristopher Peru    History of Present Illness: Paige Hardin is a 68 y.o. female with a hx of COPD on home oxygen, paroxysmal atrial fibrillation, and chronic diastolic CHF.  She was admitted for several weeks from 3/15 to 4/15 with atrial fibrillation/RVR, acute on chronic diastolic CHF, and COPD exacerbation. She ended up being cardioverted in the hospital and remained in NSR on amiodarone.  After the prolonged hospitalization, she was sent to [facility].  She was seen in f/u by Dr. Loralie Champagne 4/27 and noted to be in hypoxic respiratory failure.  She was sent to the hospital (admitted 4/27-5/8) and decompensated.  She was intubated and placed on the vent.  She was extubated 2 days later.  After extubation, she developed AFib with RVR.  She was placed on IV Amiodarone.  She was seen by Dr. Cristopher Peru for EP.  He did not feel that she required AV node ablation and pacemaker as her respiratory failure was due to AECOPD + a/c diastolic CHF and not due to the AFib with RVR.  She underwent DCCV.  She was diuresed with IV lasix and was - 16L at discharge with a weight of 279.  She was switched to Eliquis which was held briefly due to hemoptysis.  Of note, she has iron deficiency anemia and was transfused 2 units PRBCs during her hospitalization (hgb dropped to 6.9).  She returns for follow up.  Of note, she has been seen by pulmonology x 2 since d/c.  At last visit, she appeared volume overloaded and her Lasix was increased.  Sleep study will be considered in the future.    The patient is living at [facility]. She has not receiving a low-salt diet. She has noted increasing dyspnea with exertion, LE edema, 3 pillow orthopnea. She denies PND. She denies syncope.  She has felt a fullness in her chest with lying flat and is  better with sitting up.   Studies:  - Echo (12/2013):  Mild LVH, EF 60%, no RWMA, MAC, mild MR, mild to mod LAE, mild RAE, PASP 53 mmHg.   Chest CT (5//13): IMPRESSION: 1. Interstitial edema and pulmonary edema. 2. Bilateral pleural effusions and passive atelectasis. 3. Mild mediastinal adenopathy is likely reactive. 4. Right lower lobe pulmonary nodule is increased minimally over 3-1/2 year suggesting benign etiology. Recommend follow-up in 12 months.   CXR (04/21/14): IMPRESSION: Findings suggest moderate interstitial pulmonary edema related to congestive heart failure, similar to the prior study.   Recent Labs: 01/27/2014: TSH 1.384  03/23/2014: ALT 24  03/31/2014: Pro B Natriuretic peptide (BNP) 2952.0*  04/03/2014: Creatinine 1.29*; Hemoglobin 8.5*; Potassium 3.8   Wt Readings from Last 3 Encounters:  04/23/14 293 lb (132.904 kg)  04/21/14 295 lb 3.2 oz (133.902 kg)  04/07/14 287 lb 9.6 oz (130.455 kg)     Past Medical History 1. Chronic low back pain.  2. COPD: On home oxygen. Prior smoker.  3. Type 2 diabetes.  4. Paroxysmal atrial fibrillation: DCCV in 2010 and again in 4/15. She is on warfarin and is on amiodarone to maintain NSR.  5. Hyperlipidemia.  6. Diastolic congestive heart failure: Echo (2/15) with EF 60%, mild MR, PA systolic pressure 53 mmHg.  7. Hypertension.  8. Low back pain  9. Obesity  10. CKD   Past Medical History  Diagnosis Date  . Chronic low back pain   . Emphysema   . Paroxysmal atrial fibrillation     a. failed flecainide;  b. 03/2014 amio started->DCCV;  c. Chronic pradaxa.  Marland Kitchen COPD (chronic obstructive pulmonary disease)   . Hyperlipidemia   . Diastolic congestive heart failure   . Obese   . Carotid atherosclerosis   . Lung nodules   . Hoarseness, chronic   . Pneumonia 1990's    "once"  . Type 2 diabetes mellitus   . GERD (gastroesophageal reflux disease)   . Arthritis     "joints" (01/14/2014)  . Depression     "maybe"  (01/14/2014    Current Outpatient Prescriptions  Medication Sig Dispense Refill  . acetaminophen (TYLENOL) 325 MG tablet Take 325 mg by mouth daily as needed for mild pain.       Marland Kitchen amiodarone (PACERONE) 200 MG tablet Take 1 tablet (200 mg total) by mouth 2 (two) times daily. Take 2 tabs (400 mg) twice daily for 7 days, then take 2 tabs (400 mg) once daily for 14 days then take 1 tab (200 mg) once daily thereafter  90 tablet  3  . apixaban (ELIQUIS) 5 MG TABS tablet Take 1 tablet (5 mg total) by mouth 2 (two) times daily.  60 tablet    . Ascorbic Acid (VITAMIN C) 500 MG CAPS Take 500 mg by mouth daily.       . bisacodyl (DULCOLAX) 10 MG suppository Place 1 suppository (10 mg total) rectally daily as needed for moderate constipation.  12 suppository  0  . budesonide-formoterol (SYMBICORT) 160-4.5 MCG/ACT inhaler Inhale 2 puffs into the lungs 2 (two) times daily.      . Ca & Phos-Vit D-Mag (CALCIUM) [phone] TABS Take 1 tablet by mouth daily.       . calcium-vitamin D (OSCAL WITH D) 500-200 MG-UNIT per tablet Take 1 tablet by mouth daily with breakfast.      . Cholecalciferol (VITAMIN D) 2000 UNITS tablet Take 2,000 Units by mouth daily.      Marland Kitchen diltiazem (CARDIZEM CD) 180 MG 24 hr capsule Take 1 capsule (180 mg total) by mouth daily.  30 capsule  3  . ezetimibe-simvastatin (VYTORIN) 10-40 MG per tablet Take 1 tablet by mouth at bedtime.       . furosemide (LASIX) 80 MG tablet Take 160 mg by mouth daily. 2 tabs by mouth once daily.      Marland Kitchen glyBURIDE micronized (GLYNASE) 3 MG tablet Take 3 mg by mouth daily.       . insulin aspart (NOVOLOG) 100 UNIT/ML injection Inject 3 Units into the skin 3 (three) times daily with meals.  10 mL  11  . levalbuterol (XOPENEX HFA) 45 MCG/ACT inhaler Inhale 2 puffs into the lungs every 6 (six) hours as needed for wheezing.  1 Inhaler  12  . levalbuterol (XOPENEX) 0.63 MG/3ML nebulizer solution Take 3 mLs (0.63 mg total) by nebulization every 6 (six) hours as needed  for wheezing or shortness of breath.  3 mL  12  . Magnesium-Zinc 133.33-5 MG TABS Take 133 mg by mouth daily.      . metoprolol (LOPRESSOR) 50 MG tablet Take 1 tablet (50 mg total) by mouth 2 (two) times daily.  60 tablet  3  . Multiple Vitamins-Iron (MULTIVITAMIN/IRON PO) Take 1 tablet by mouth daily.      Marland Kitchen omeprazole (PRILOSEC) 20 MG capsule Take 20 mg by  mouth daily.      . potassium chloride SA (K-DUR,KLOR-CON) 20 MEQ tablet Take 20 mEq by mouth daily.       . sitaGLIPtin (JANUVIA) 100 MG tablet Take 100 mg by mouth daily.      . sodium chloride (OCEAN) 0.65 % SOLN nasal spray Place 1 spray into both nostrils as needed for congestion.      Marland Kitchen tiotropium (SPIRIVA) 18 MCG inhalation capsule Place 18 mcg into inhaler and inhale daily.      . vitamin B-12 (CYANOCOBALAMIN) 500 MCG tablet Take 500 mcg by mouth daily.       No current facility-administered medications for this visit.    Allergies:   Ace inhibitors and Clindamycin   Social History:  The patient  reports that she quit smoking about 5 years ago. Her smoking use included Cigarettes. She has a 135 pack-year smoking history. She has never used smokeless tobacco. She reports that she drinks alcohol. She reports that she does not use illicit drugs.   Family History:  The patient's family history includes Asthma in her mother; Breast cancer in her sister; Heart disease in an other family member; Hodgkin's lymphoma in an other family member.   ROS:  Please see the history of present illness.   She has a nonproductive cough. She denies wheezing.   All other systems reviewed and negative.   PHYSICAL EXAM: VS:  BP 130/60  Pulse 68  Ht 5\' 5"  (1.651 m)  Wt 293 lb (132.904 kg)  BMI 48.76 kg/m2 Well nourished, well developed, in no acute distress HEENT: normal Neck: + JVDat 90 Cardiac:  normal S1, S2; RRR; no murmur Lungs:  Bibasilar crackles, no wheezing Abd: soft, nontender, no hepatomegaly Ext: 2+ bilateral LE edema Skin: warm and  dry Neuro:  CNs 2-12 intact, no focal abnormalities noted  EKG:  NSR, HR 68, normal axis, QTc 489     ASSESSMENT AND PLAN:  1. Acute on chronic diastolic heart failure: She has developed recurrent volume overload. I suspect her diet which is rich in salt at the nursing home is contributing. I have written an order for a low-salt diet. She is maintaining her oxygen saturation.  She does not look toxic on exam. I will try to adjust her diuretics as an outpatient by increasing her Lasix to 160 mg twice a day. I will add metolazone 5 mg today and repeat x1 on Saturday. Increase potassium to 20 mEq twice a day. She will take an extra potassium on metolazone days. Check a basic metabolic panel today and again on 04/27/14. She will be brought back in close followup. She will be referred to the CHF clinic. I suspect her anemia is also worsening her volume excess. I will refer her to hematology. 2. PAF (paroxysmal atrial fibrillation) s/p multiple DCCVs; on Amiodarone: Maintaining NSR on amiodarone. She remains on Eliquis. If she continues to have worsening anemia, we may need to hold her Eliquis. 3. HTN (hypertension): Controlled. 4. Chronic renal disease, stage III: Obtain basic metabolic panel today and again early next week with increased diuresis. 5. COPD with emphysema: Continue followup with pulmonology. 6. HYPERLIPIDEMIA-MIXED: Continue Vytorin. 7. Anemia: She had a colonoscopy and EGD in 2013 demonstrated Candida esophagitis and colon polyps. I suspect that she has anemia of chronic disease contributing. I will refer to hematology for assistance. 8. Disposition: Follow up with me or Dr. Aundra Dubin early next week. Refer to CHF clinic as noted.   Signed, Richardson Dopp, PA-C, MHS  04/23/2014 10:13 AM    [address], [place], [place]  [address] Phone: [phone]; Fax: [phone]

## 2014-04-23 NOTE — Patient Instructions (Signed)
Your physician has recommended you make the following change in your medication:  1. INCREASE LASIX TO 160 MG TWICE DAILY; PER SCOTT WEAVER, PAC MAKE SURE TO TAKE 8 AM AND 2 PM 2. START METOLAZONE 5 MG THIS AFTERNOON 30 MINUTES BEFORE YOUR AFTERNOON DOSE OF LASIX TODAY; THEN AGAIN ON Saturday 04/25/14 30 MINUTES BEFORE YOUR MORNING DOSE OF LASIX. 3. INCREASE POTASSIUM TO 20 MEQ TWICE DAILY; ON THE DAYS YOU TAKE METOLAZONE YOU WILL NEED TO TAKE AN EXTRA 20 MEQ OF POTASSIUM ON THOSE DAYS  LAB WORK TODAY; BMET  HAVE THE NURSING FACILITY REPEAT BMET ON Monday 04/27/14 WITH THE RESULTS TO BE FAXED TO [place], [place]  You have been referred to [facility] have been referred to HEMATOLOGY, DX ANEMIA  PLEASE FOLLOW UP [place], PAC IN 1 WEEK SAME DAY DR. Aundra Dubin IS IN THE OFFICE.

## 2014-04-24 ENCOUNTER — Telehealth: Payer: Self-pay | Admitting: *Deleted

## 2014-04-24 NOTE — Telephone Encounter (Signed)
pt notified about lab results with verbal understanding  

## 2014-04-30 ENCOUNTER — Ambulatory Visit (INDEPENDENT_AMBULATORY_CARE_PROVIDER_SITE_OTHER): Payer: Medicare Other | Admitting: Physician Assistant

## 2014-04-30 ENCOUNTER — Encounter: Payer: Self-pay | Admitting: Physician Assistant

## 2014-04-30 VITALS — BP 110/58 | HR 58 | Ht 65.0 in | Wt 278.0 lb

## 2014-04-30 DIAGNOSIS — I5032 Chronic diastolic (congestive) heart failure: Secondary | ICD-10-CM | POA: Diagnosis not present

## 2014-04-30 DIAGNOSIS — N183 Chronic kidney disease, stage 3 unspecified: Secondary | ICD-10-CM | POA: Diagnosis not present

## 2014-04-30 DIAGNOSIS — I10 Essential (primary) hypertension: Secondary | ICD-10-CM | POA: Diagnosis not present

## 2014-04-30 DIAGNOSIS — I4891 Unspecified atrial fibrillation: Secondary | ICD-10-CM | POA: Diagnosis not present

## 2014-04-30 DIAGNOSIS — I480 Paroxysmal atrial fibrillation: Secondary | ICD-10-CM

## 2014-04-30 DIAGNOSIS — D649 Anemia, unspecified: Secondary | ICD-10-CM

## 2014-04-30 LAB — BASIC METABOLIC PANEL
BUN: 32 mg/dL — ABNORMAL HIGH (ref 6–23)
CALCIUM: 9.3 mg/dL (ref 8.4–10.5)
CHLORIDE: 86 meq/L — AB (ref 96–112)
CO2: 39 meq/L — AB (ref 19–32)
Creatinine, Ser: 1.6 mg/dL — ABNORMAL HIGH (ref 0.4–1.2)
GFR: 33.02 mL/min — ABNORMAL LOW (ref >60.00)
GLUCOSE: 192 mg/dL — AB (ref 70–99)
Potassium: 3.6 mEq/L (ref 3.5–5.1)
SODIUM: 135 meq/L (ref 135–145)

## 2014-04-30 LAB — MAGNESIUM: Magnesium: 2 mg/dL (ref 1.5–2.5)

## 2014-04-30 NOTE — Patient Instructions (Signed)
LAB WORK TODAY; BMET, MAGNESIUM LEVEL  KEEP YOUR APPT WITH DR. Aundra Dubin 05/05/14

## 2014-04-30 NOTE — Progress Notes (Signed)
Cardiology Office Note   Date:  04/30/2014   ID:  Paige Hardin, DOB 09/20/1946, MRN [id number]  PCP:  Stephens Shire, MD  Cardiologist:  Dr. Loralie Champagne   Electrophysiologist:  Dr. Cristopher Peru    History of Present Illness: Paige Hardin is a 68 y.o. female with a hx of COPD on home oxygen, paroxysmal atrial fibrillation, and chronic diastolic CHF.  She was admitted for several weeks from 3/15 to 4/15 with atrial fibrillation/RVR, acute on chronic diastolic CHF, and COPD exacerbation. She ended up being cardioverted in the hospital and remained in NSR on amiodarone.  After the prolonged hospitalization, she was sent to [facility].  She was seen in f/u by Dr. Loralie Champagne 4/27 and noted to be in hypoxic respiratory failure.  She was sent to the hospital (admitted 4/27-5/8) and decompensated.  She was intubated and placed on the vent.  She was extubated 2 days later.  After extubation, she developed AFib with RVR.  She was placed on IV Amiodarone.  She was seen by Dr. Cristopher Peru for EP.  He did not feel that she required AV node ablation and pacemaker as her respiratory failure was due to AECOPD + a/c diastolic CHF and not due to the AFib with RVR.  She underwent DCCV.  She was diuresed with IV lasix and was - 16L at discharge with a weight of 279.  She was switched to Eliquis which was held briefly due to hemoptysis.  Of note, she has iron deficiency anemia and was transfused 2 units PRBCs during her hospitalization (hgb dropped to 6.9).    I saw her 04/23/14 in follow up.  The patient is living at [facility].  She was volume overloaded.  She was + 14 lbs.  I adjusted her Lasix and gave her 2 doses of Metolazone.  I referred her to hematology for her anemia.  She returns for follow up.  She is feeling better. Her weight is down 15 pounds. She is less short of breath. She denies chest discomfort. She denies syncope. She denies PND. LE edema is improved.   Studies:  - Echo (12/2013):  Mild  LVH, EF 60%, no RWMA, MAC, mild MR, mild to mod LAE, mild RAE, PASP 53 mmHg.   Chest CT (5//13): IMPRESSION: 1. Interstitial edema and pulmonary edema. 2. Bilateral pleural effusions and passive atelectasis. 3. Mild mediastinal adenopathy is likely reactive. 4. Right lower lobe pulmonary nodule is increased minimally over 3-1/2 year suggesting benign etiology. Recommend follow-up in 12 months.   CXR (04/21/14): IMPRESSION: Findings suggest moderate interstitial pulmonary edema related to congestive heart failure, similar to the prior study.   Recent Labs: 01/27/2014: TSH 1.384  03/23/2014: ALT 24  03/31/2014: Pro B Natriuretic peptide (BNP) 2952.0*  04/03/2014: Hemoglobin 8.5*  04/23/2014: Creatinine 1.3*; Potassium 4.0   Wt Readings from Last 3 Encounters:  04/30/14 278 lb (126.1 kg)  04/23/14 293 lb (132.904 kg)  04/21/14 295 lb 3.2 oz (133.902 kg)     Past Medical History 1. Chronic low back pain.  2. COPD: On home oxygen. Prior smoker.  3. Type 2 diabetes.  4. Paroxysmal atrial fibrillation: DCCV in 2010 and again in 4/15. She is on warfarin and is on amiodarone to maintain NSR.  5. Hyperlipidemia.  6. Diastolic congestive heart failure: Echo (2/15) with EF 60%, mild MR, PA systolic pressure 53 mmHg.  7. Hypertension.  8. Low back pain  9. Obesity  10. CKD   Past  Medical History  Diagnosis Date  . Chronic low back pain   . Emphysema   . Paroxysmal atrial fibrillation     a. failed flecainide;  b. 03/2014 amio started->DCCV;  c. Chronic pradaxa.  Marland Kitchen COPD (chronic obstructive pulmonary disease)   . Hyperlipidemia   . Diastolic congestive heart failure   . Obese   . Carotid atherosclerosis   . Lung nodules   . Hoarseness, chronic   . Pneumonia 1990's    "once"  . Type 2 diabetes mellitus   . GERD (gastroesophageal reflux disease)   . Arthritis     "joints" (01/14/2014)  . Depression     "maybe" (01/14/2014    Current Outpatient Prescriptions  Medication Sig  Dispense Refill  . acetaminophen (TYLENOL) 325 MG tablet Take 325 mg by mouth daily as needed for mild pain.       Marland Kitchen amiodarone (PACERONE) 200 MG tablet Take 1 tablet (200 mg total) by mouth daily.      Marland Kitchen apixaban (ELIQUIS) 5 MG TABS tablet Take 1 tablet (5 mg total) by mouth 2 (two) times daily.  60 tablet    . Ascorbic Acid (VITAMIN C) 500 MG CAPS Take 500 mg by mouth daily.       . bisacodyl (DULCOLAX) 10 MG suppository Place 1 suppository (10 mg total) rectally daily as needed for moderate constipation.  12 suppository  0  . budesonide-formoterol (SYMBICORT) 160-4.5 MCG/ACT inhaler Inhale 2 puffs into the lungs 2 (two) times daily.      . Ca & Phos-Vit D-Mag (CALCIUM) [phone] TABS Take 1 tablet by mouth daily.       . calcium-vitamin D (OSCAL WITH D) 500-200 MG-UNIT per tablet Take 1 tablet by mouth daily with breakfast.      . Cholecalciferol (VITAMIN D) 2000 UNITS tablet Take 2,000 Units by mouth daily.      Marland Kitchen diltiazem (CARDIZEM CD) 180 MG 24 hr capsule Take 1 capsule (180 mg total) by mouth daily.  30 capsule  3  . ezetimibe-simvastatin (VYTORIN) 10-40 MG per tablet Take 1 tablet by mouth at bedtime.       . furosemide (LASIX) 80 MG tablet Take 2 tablets (160 mg total) by mouth 2 (two) times daily. MAKE SURE TO TAKE AT 8 AM AND 2 PM      . glyBURIDE micronized (GLYNASE) 3 MG tablet Take 3 mg by mouth daily.       . insulin aspart (NOVOLOG) 100 UNIT/ML injection Inject 3 Units into the skin 3 (three) times daily with meals.  10 mL  11  . levalbuterol (XOPENEX HFA) 45 MCG/ACT inhaler Inhale 2 puffs into the lungs every 6 (six) hours as needed for wheezing.  1 Inhaler  12  . levalbuterol (XOPENEX) 0.63 MG/3ML nebulizer solution Take 3 mLs (0.63 mg total) by nebulization every 6 (six) hours as needed for wheezing or shortness of breath.  3 mL  12  . Magnesium-Zinc 133.33-5 MG TABS Take 133 mg by mouth daily.      . metolazone (ZAROXOLYN) 5 MG tablet Take 1 tablet (5 mg total) by mouth as  directed. TAKE 1 TAB TODAY 5/28 AND 1 TAB ON Saturday 5/30      . metoprolol (LOPRESSOR) 50 MG tablet Take 1 tablet (50 mg total) by mouth 2 (two) times daily.  60 tablet  3  . Multiple Vitamins-Iron (MULTIVITAMIN/IRON PO) Take 1 tablet by mouth daily.      Marland Kitchen omeprazole (PRILOSEC) 20 MG capsule Take  20 mg by mouth daily.      . potassium chloride SA (K-DUR,KLOR-CON) 20 MEQ tablet Take 1 tablet (20 mEq total) by mouth 2 (two) times daily. MAKE SURE TO TAKE EXTRA 20 MEQ ON METOLAZONE DAYS      . sitaGLIPtin (JANUVIA) 100 MG tablet Take 100 mg by mouth daily.      . sodium chloride (OCEAN) 0.65 % SOLN nasal spray Place 1 spray into both nostrils as needed for congestion.      Marland Kitchen tiotropium (SPIRIVA) 18 MCG inhalation capsule Place 18 mcg into inhaler and inhale daily.      . vitamin B-12 (CYANOCOBALAMIN) 500 MCG tablet Take 500 mcg by mouth daily.       No current facility-administered medications for this visit.    Allergies:   Ace inhibitors and Clindamycin   Social History:  The patient  reports that she quit smoking about 5 years ago. Her smoking use included Cigarettes. She has a 135 pack-year smoking history. She has never used smokeless tobacco. She reports that she drinks alcohol. She reports that she does not use illicit drugs.   Family History:  The patient's family history includes Asthma in her mother; Breast cancer in her sister; Heart disease in an other family member; Hodgkin's lymphoma in an other family member.   ROS:  Please see the history of present illness.   She denies bleeding problems.  All other systems reviewed and negative.   PHYSICAL EXAM: VS:  BP 110/58  Pulse 58  Ht 5\' 5"  (1.651 m)  Wt 278 lb (126.1 kg)  BMI 46.26 kg/m2 Well nourished, well developed, in no acute distress HEENT: normal Neck: no JVDat 90 Cardiac:  normal S1, S2; RRR; no murmur Lungs:  Decreased breath sounds bilaterally, faint Bibasilar crackles, no wheezing Abd: soft, nontender, no  hepatomegaly Ext: 1-2+ bilateral LE edema Skin: warm and dry Neuro:  CNs 2-12 intact, no focal abnormalities noted  EKG:  Sinus bradycardia, HR 58, QTc 530      ASSESSMENT AND PLAN:  1. Chronic diastolic heart failure:  Volume is improved. She still has a ways to go. Continue on increased dose of Lasix. She has followup with the CHF clinic next week. Obtain followup basic metabolic panel today. Consider the addition of spironolactone.  I will see what her creatinine and potassium are first. 2. PAF (paroxysmal atrial fibrillation) s/p multiple DCCVs; on Amiodarone: Maintaining NSR on amiodarone. She remains on Eliquis.  3. HTN (hypertension):  Controlled. 4. Chronic renal disease, stage III: check BMET today. 5. COPD with emphysema:  Continue followup with pulmonology. 6. HYPERLIPIDEMIA-MIXED:  Continue Vytorin. 7. Anemia:  She has an appt with hematology next week. 8. Disposition: Follow up with Dr. Loralie Champagne in the CHF clinic next week.    Signed, Versie Starks, MHS 04/30/2014 3:30 PM    [facility] [address], [place], [place]  [address] Phone: [phone]; Fax: [phone]

## 2014-05-01 ENCOUNTER — Telehealth: Payer: Self-pay | Admitting: *Deleted

## 2014-05-01 NOTE — Telephone Encounter (Signed)
pt notified about lab results and that she will need a repeat bmet 6/9 when she sees Dr. Aundra Dubin in the HF clinic; otherwise continue current treatment plan, no changes.

## 2014-05-05 ENCOUNTER — Encounter (HOSPITAL_COMMUNITY): Payer: Self-pay

## 2014-05-05 ENCOUNTER — Ambulatory Visit (HOSPITAL_COMMUNITY)
Admission: RE | Admit: 2014-05-05 | Discharge: 2014-05-05 | Disposition: A | Discharge status: Home / Self Care | Payer: No Typology Code available for payment source | Source: Ambulatory Visit | Attending: Cardiology | Admitting: Cardiology

## 2014-05-05 VITALS — BP 126/48 | HR 60 | Wt 279.8 lb

## 2014-05-05 DIAGNOSIS — I5032 Chronic diastolic (congestive) heart failure: Secondary | ICD-10-CM | POA: Insufficient documentation

## 2014-05-05 DIAGNOSIS — Z7901 Long term (current) use of anticoagulants: Secondary | ICD-10-CM | POA: Insufficient documentation

## 2014-05-05 DIAGNOSIS — I4891 Unspecified atrial fibrillation: Secondary | ICD-10-CM | POA: Insufficient documentation

## 2014-05-05 DIAGNOSIS — G8929 Other chronic pain: Secondary | ICD-10-CM | POA: Insufficient documentation

## 2014-05-05 DIAGNOSIS — I480 Paroxysmal atrial fibrillation: Secondary | ICD-10-CM

## 2014-05-05 DIAGNOSIS — Z87891 Personal history of nicotine dependence: Secondary | ICD-10-CM | POA: Insufficient documentation

## 2014-05-05 DIAGNOSIS — E785 Hyperlipidemia, unspecified: Secondary | ICD-10-CM | POA: Insufficient documentation

## 2014-05-05 DIAGNOSIS — Z794 Long term (current) use of insulin: Secondary | ICD-10-CM | POA: Insufficient documentation

## 2014-05-05 DIAGNOSIS — I129 Hypertensive chronic kidney disease with stage 1 through stage 4 chronic kidney disease, or unspecified chronic kidney disease: Secondary | ICD-10-CM | POA: Insufficient documentation

## 2014-05-05 DIAGNOSIS — J449 Chronic obstructive pulmonary disease, unspecified: Secondary | ICD-10-CM | POA: Insufficient documentation

## 2014-05-05 DIAGNOSIS — N189 Chronic kidney disease, unspecified: Secondary | ICD-10-CM | POA: Insufficient documentation

## 2014-05-05 DIAGNOSIS — I509 Heart failure, unspecified: Secondary | ICD-10-CM | POA: Insufficient documentation

## 2014-05-05 DIAGNOSIS — D509 Iron deficiency anemia, unspecified: Secondary | ICD-10-CM | POA: Insufficient documentation

## 2014-05-05 DIAGNOSIS — R911 Solitary pulmonary nodule: Secondary | ICD-10-CM | POA: Insufficient documentation

## 2014-05-05 DIAGNOSIS — Z79899 Other long term (current) drug therapy: Secondary | ICD-10-CM | POA: Insufficient documentation

## 2014-05-05 DIAGNOSIS — E119 Type 2 diabetes mellitus without complications: Secondary | ICD-10-CM | POA: Insufficient documentation

## 2014-05-05 DIAGNOSIS — J4489 Other specified chronic obstructive pulmonary disease: Secondary | ICD-10-CM | POA: Insufficient documentation

## 2014-05-05 DIAGNOSIS — E669 Obesity, unspecified: Secondary | ICD-10-CM | POA: Insufficient documentation

## 2014-05-05 LAB — HEPATIC FUNCTION PANEL
ALBUMIN: 3.4 g/dL — AB (ref 3.5–5.2)
ALT: 14 U/L (ref 0–35)
AST: 15 U/L (ref 0–37)
Alkaline Phosphatase: 69 U/L (ref 39–117)
Bilirubin, Direct: <0.2 mg/dL (ref 0.0–0.3)
TOTAL PROTEIN: 7.1 g/dL (ref 6.0–8.3)
Total Bilirubin: 0.5 mg/dL (ref 0.3–1.2)

## 2014-05-05 LAB — BASIC METABOLIC PANEL
BUN: 27 mg/dL — AB (ref 6–23)
CO2: 34 meq/L — AB (ref 19–32)
Calcium: 9.7 mg/dL (ref 8.4–10.5)
Chloride: 93 mEq/L — ABNORMAL LOW (ref 96–112)
Creatinine, Ser: 1.42 mg/dL — ABNORMAL HIGH (ref 0.50–1.10)
GFR calc Af Amer: 43 mL/min — ABNORMAL LOW (ref >90)
GFR, EST NON AFRICAN AMERICAN: 37 mL/min — AB (ref >90)
GLUCOSE: 187 mg/dL — AB (ref 70–99)
POTASSIUM: 4.4 meq/L (ref 3.7–5.3)
Sodium: 137 mEq/L (ref 137–147)

## 2014-05-05 LAB — PRO B NATRIURETIC PEPTIDE: PRO B NATRI PEPTIDE: 999.9 pg/mL — AB (ref 0–125)

## 2014-05-05 LAB — TSH: TSH: 3.69 u[IU]/mL (ref 0.350–4.500)

## 2014-05-05 NOTE — Patient Instructions (Signed)
Labs today  Your physician recommends that you schedule a follow-up appointment in: 1-2 week

## 2014-05-06 DIAGNOSIS — E119 Type 2 diabetes mellitus without complications: Secondary | ICD-10-CM | POA: Diagnosis not present

## 2014-05-06 DIAGNOSIS — I509 Heart failure, unspecified: Secondary | ICD-10-CM | POA: Diagnosis not present

## 2014-05-06 NOTE — Progress Notes (Signed)
Patient ID: Paige Hardin, female   DOB: 1946/03/10, 68 y.o.   MRN: [id number] PCP: Dr. Tollie Pizza  68 yo with history of COPD on home oxygen, paroxysmal atrial fibrillation, and chronic diastolic CHF presents today for followup after recent [facility] admissions. She was admitted for several weeks from 3/15 to 4/15 with atrial fibrillation/RVR, acute on chronic diastolic CHF, and COPD exacerbation.  She ended up being cardioverted in the [facility].  She was diuresed with IV Lasix.  After the prolonged hospitalization, she was sent to [facility].  She was again admitted at the end of 4/15 and hospitalized into 5/15 with acute on chronic diastolic CHF.  She was intubated this time and went back into atrial fibrillation with RVR.  She was cardioverted back to NSR and is maintained on amiodarone.  She was again diuresed and discharged to SNF [facility]).    She is here today with a friend.  She is doing better.  Her weight has been stable over the last week.  She is not taking metolazone now, just using Lasix 160 mg bid.  Creatinine increased to 1.6 when last checked.  She is in NSR today and denies recent tachypalpitations.  She has Fe deficiency anemia but no overt bleeding, she has been referred to hematology.  She is on Eliquis for atrial fibrillation.  She is doing PT at SNF, walking with walker.  Her breathing fluctuates, some dyspnea walking longer distances.  No chest pain.  She has chronic, stable 2 pillow orthopnea.    Labs (4/15): K 4.4, creatinine 1.4 Labs (5/15): creatinine 1.3, hemoglobin 8.5 Labs (6/15): K 3.6, creatinine 1.6  1. Chronic low back pain.  2. COPD: On home oxygen. Prior smoker.  3. Type 2 diabetes.  4. Paroxysmal atrial fibrillation:  DCCV in 2010 and again in 4/15.  She is on warfarin and is on amiodarone to maintain NSR.   5. Hyperlipidemia.  6. Diastolic congestive heart failure: Echo (2/15) with EF 60%, mild MR, PA systolic pressure 53 mmHg. 7. Hypertension.  8. Low back  pain 9. Obesity 10. CKD 11. Anemia: Fe deficiency 12. RLL nodule  13. ACEI cough  SOCIAL HISTORY: Prior smoker.  Patient is currently in SNF [facility]).  FAMILY HISTORY: Non-Hodgkin lymphoma and breast cancer. No early-onset  coronary artery disease.   REVIEW OF SYSTEMS: Negative except as noted in the history of present  Illness.  Current Outpatient Prescriptions  Medication Sig Dispense Refill  . acetaminophen (TYLENOL) 325 MG tablet Take 325 mg by mouth daily as needed for mild pain.       Marland Kitchen amiodarone (PACERONE) 200 MG tablet Take 1 tablet (200 mg total) by mouth daily.      Marland Kitchen apixaban (ELIQUIS) 5 MG TABS tablet Take 1 tablet (5 mg total) by mouth 2 (two) times daily.  60 tablet    . Ascorbic Acid (VITAMIN C) 500 MG CAPS Take 500 mg by mouth daily.       . bisacodyl (DULCOLAX) 10 MG suppository Place 1 suppository (10 mg total) rectally daily as needed for moderate constipation.  12 suppository  0  . budesonide-formoterol (SYMBICORT) 160-4.5 MCG/ACT inhaler Inhale 2 puffs into the lungs 2 (two) times daily.      . Ca & Phos-Vit D-Mag (CALCIUM) [phone] TABS Take 1 tablet by mouth daily.       . calcium-vitamin D (OSCAL WITH D) 500-200 MG-UNIT per tablet Take 1 tablet by mouth daily with breakfast.      .  Cholecalciferol (VITAMIN D) 2000 UNITS tablet Take 2,000 Units by mouth daily.      Marland Kitchen diltiazem (CARDIZEM CD) 180 MG 24 hr capsule Take 1 capsule (180 mg total) by mouth daily.  30 capsule  3  . ezetimibe-simvastatin (VYTORIN) 10-40 MG per tablet Take 1 tablet by mouth at bedtime.       . ferrous sulfate 325 (65 FE) MG tablet Take 325 mg by mouth daily with breakfast.      . furosemide (LASIX) 80 MG tablet Take 2 tablets (160 mg total) by mouth 2 (two) times daily. MAKE SURE TO TAKE AT 8 AM AND 2 PM      . glyBURIDE micronized (GLYNASE) 3 MG tablet Take 3 mg by mouth daily.       . insulin aspart (NOVOLOG) 100 UNIT/ML injection Inject 3 Units into the skin 3 (three) times  daily with meals.  10 mL  11  . levalbuterol (XOPENEX HFA) 45 MCG/ACT inhaler Inhale 2 puffs into the lungs every 6 (six) hours as needed for wheezing.  1 Inhaler  12  . levalbuterol (XOPENEX) 0.63 MG/3ML nebulizer solution Take 3 mLs (0.63 mg total) by nebulization every 6 (six) hours as needed for wheezing or shortness of breath.  3 mL  12  . LORazepam (ATIVAN) 0.5 MG tablet Take 0.5 mg by mouth every 4 (four) hours as needed for anxiety.      . Magnesium-Zinc 133.33-5 MG TABS Take 133 mg by mouth daily.      . metoprolol (LOPRESSOR) 50 MG tablet Take 1 tablet (50 mg total) by mouth 2 (two) times daily.  60 tablet  3  . Multiple Vitamins-Iron (MULTIVITAMIN/IRON PO) Take 1 tablet by mouth daily.      Marland Kitchen omeprazole (PRILOSEC) 20 MG capsule Take 20 mg by mouth daily.      . potassium chloride SA (K-DUR,KLOR-CON) 20 MEQ tablet Take 20 mEq by mouth 3 (three) times daily. MAKE SURE TO TAKE EXTRA 20 MEQ ON METOLAZONE DAYS      . sitaGLIPtin (JANUVIA) 100 MG tablet Take 100 mg by mouth daily.      . sodium chloride (OCEAN) 0.65 % SOLN nasal spray Place 1 spray into both nostrils as needed for congestion.      Marland Kitchen tiotropium (SPIRIVA) 18 MCG inhalation capsule Place 18 mcg into inhaler and inhale daily.      . vitamin B-12 (CYANOCOBALAMIN) 500 MCG tablet Take 500 mcg by mouth daily.       No current facility-administered medications for this encounter.   BP 126/48  Pulse 60  Wt 279 lb 12.8 oz (126.916 kg)  SpO2 92% General: Tachypneic, pale, appears quite uncomfortable Neck: Thick, JVP 8 cm, no thyromegaly or thyroid nodule.  Lungs: Distant breath sounds bilaterally with slight crackles at bases. CV: Nondisplaced PMI.  Heart regular S1/S2, no S3/S4, 2/6 early SEM RUSB. 1+ edema to knees bilaterally.  No carotid bruit.  Normal pedal pulses.  Abdomen: Soft, nontender, no hepatosplenomegaly, no distention.  Skin: Intact without lesions or rashes.  Neurologic: Alert and oriented x 3.  Psych: Normal  affect. Extremities: No clubbing or cyanosis.   Assessment/Plan: 1. Atrial fibrillation: Paroxysmal.  She remains in NSR today on amiodarone and Eliquis.  Check TSH/LFTs today.  Will need yearly eye exam.  She by EP in hospital most recent admission, recommended against AV nodal ablation/pacing.  2. Chronic diastolic CHF: EF 67% on last echo.  Volume status improved though likely still mildly elevated.  She is taking Lasix 160 mg bid and maintaining her weight.  I will avoid metolazone for now given rise in creatinine.  Check BMET/BNP today and will have her return for evaluation in about 10 days to office.  4. CKD: Will need BMET to reassess creatinine.  5. Anemia: CBC today.  Will be seeing hematology.  Fe deficiency anemia but no overt bleeding.  She is, of note, on Eliquis.   Loralie Champagne 05/06/2014

## 2014-05-07 ENCOUNTER — Telehealth: Payer: Self-pay | Admitting: Hematology & Oncology

## 2014-05-07 NOTE — Telephone Encounter (Signed)
Spoke w NEW PATIENT today to remind them of their appointment with Dr. Marin Olp. Also, advised them to bring all medication bottles and insurance card information.  Pt advised, she has a list of her meds. The nursing facility has her bottles.

## 2014-05-08 ENCOUNTER — Encounter: Payer: Self-pay | Admitting: Hematology & Oncology

## 2014-05-08 ENCOUNTER — Ambulatory Visit: Payer: Medicare Other

## 2014-05-08 ENCOUNTER — Other Ambulatory Visit (HOSPITAL_BASED_OUTPATIENT_CLINIC_OR_DEPARTMENT_OTHER): Payer: Medicare Other | Admitting: Lab

## 2014-05-08 ENCOUNTER — Ambulatory Visit (HOSPITAL_BASED_OUTPATIENT_CLINIC_OR_DEPARTMENT_OTHER): Payer: Medicare Other | Admitting: Hematology & Oncology

## 2014-05-08 VITALS — BP 119/40 | HR 55 | Temp 97.6°F | Resp 16 | Ht 65.0 in | Wt 274.0 lb

## 2014-05-08 DIAGNOSIS — D509 Iron deficiency anemia, unspecified: Secondary | ICD-10-CM | POA: Diagnosis not present

## 2014-05-08 DIAGNOSIS — D631 Anemia in chronic kidney disease: Secondary | ICD-10-CM

## 2014-05-08 DIAGNOSIS — N039 Chronic nephritic syndrome with unspecified morphologic changes: Principal | ICD-10-CM

## 2014-05-08 DIAGNOSIS — N289 Disorder of kidney and ureter, unspecified: Secondary | ICD-10-CM

## 2014-05-08 LAB — CBC WITH DIFFERENTIAL (CANCER CENTER ONLY)
BASO#: 0 10*3/uL (ref 0.0–0.2)
BASO%: 0.4 % (ref 0.0–2.0)
EOS ABS: 0.3 10*3/uL (ref 0.0–0.5)
EOS%: 2.4 % (ref 0.0–7.0)
HEMATOCRIT: 31.4 % — AB (ref 34.8–46.6)
HEMOGLOBIN: 9.3 g/dL — AB (ref 11.6–15.9)
LYMPH#: 0.6 10*3/uL — AB (ref 0.9–3.3)
LYMPH%: 5.5 % — ABNORMAL LOW (ref 14.0–48.0)
MCH: 26.8 pg (ref 26.0–34.0)
MCHC: 29.6 g/dL — ABNORMAL LOW (ref 32.0–36.0)
MCV: 91 fL (ref 81–101)
MONO#: 0.8 10*3/uL (ref 0.1–0.9)
MONO%: 7.2 % (ref 0.0–13.0)
NEUT#: 8.8 10*3/uL — ABNORMAL HIGH (ref 1.5–6.5)
NEUT%: 84.5 % — ABNORMAL HIGH (ref 39.6–80.0)
Platelets: 288 10*3/uL (ref 145–400)
RBC: 3.47 10*6/uL — ABNORMAL LOW (ref 3.70–5.32)
RDW: 19.3 % — ABNORMAL HIGH (ref 11.1–15.7)
WBC: 10.4 10*3/uL — AB (ref 3.9–10.0)

## 2014-05-08 LAB — CHCC SATELLITE - SMEAR

## 2014-05-08 NOTE — Progress Notes (Signed)
Referral MD  Reason for Referral: Anemia-likely is a very to renal insufficiency and possible iron deficiency   Chief Complaint  Patient presents with  . NEW PATIENT  : I am here because my kidneys  HPI: Paige Hardin is a very nice 68 year old Paige Hardin female. She is in a wheelchair. She has morbid obesity. She has diabetes. She is not on insulin. She has congestive heart failure. His atrial fibrillation. She has underlying COPD from smoking. She probably has about a 60-pack-year history of tobacco use. She stopped 5 years ago.  She's been hospitalized recently. She has anemia. She was transfused a month ago. When she was hospitalized, her hemoglobin was 6.9. Her Gergely cell count and platelet count was okay.  Iron studies done back in early May showed a ferritin of 85 with iron saturation of only 9%. I don't think she ever has had IV iron. She may be on oral iron but this is not going to work because of all of her medications.  She's had no bleeding. She has had endoscopies.  Visits very strong family history of breast cancer. She says her mother, aunt and 39 of her sisters have had breast cancer. She has not had a mammogram in 5 or 6 years. She does not want to do any genetic testing.  She is on anticoagulation. She is on Eliquis.  Her appetite is marginal. She has had no nausea or vomiting. She's had no diarrhea. There's been no rashes. She's had no increased cough. She is on oxygen.  We are asked to see her for the anemia.     Past Medical History  Diagnosis Date  . Chronic low back pain   . Emphysema   . Paroxysmal atrial fibrillation     a. failed flecainide;  b. 03/2014 amio started->DCCV;  c. Chronic pradaxa.  Marland Kitchen COPD (chronic obstructive pulmonary disease)   . Hyperlipidemia   . Diastolic congestive heart failure   . Obese   . Carotid atherosclerosis   . Lung nodules   . Hoarseness, chronic   . Pneumonia 1990's    "once"  . Type 2 diabetes mellitus   . GERD  (gastroesophageal reflux disease)   . Arthritis     "joints" (01/14/2014)  . Depression     "maybe" (01/14/2014  :  Past Surgical History  Procedure Laterality Date  . Esophagogastroduodenoscopy  01/26/2012    Procedure: ESOPHAGOGASTRODUODENOSCOPY (EGD);  Surgeon: Beryle Beams, MD;  Location: [facility];  Service: Endoscopy;  Laterality: N/A;  . Colonoscopy  01/26/2012    Procedure: COLONOSCOPY;  Surgeon: Beryle Beams, MD;  Location: [facility];  Service: Endoscopy;  Laterality: N/A;  . Breast biopsy Left ~ 1980    "solid"  . Breast lumpectomy Left ~ 1980    "removed a mass; it was benign" (01/14/2014)  . Total abdominal hysterectomy  1994  . Cardioversion N/A 03/11/2014    Procedure: CARDIOVERSION;  Surgeon: Sanda Klein, MD;  Location: [facility];  Service: Cardiovascular;  Laterality: N/A;  . Cardioversion N/A 03/27/2014    Procedure: CARDIOVERSION;  Surgeon: Darlin Coco, MD;  Location: [facility];  Service: Cardiovascular;  Laterality: N/A;  :  Current outpatient prescriptions:acetaminophen (TYLENOL) 325 MG tablet, Take 325 mg by mouth daily as needed for mild pain. , Disp: , Rfl: ;  amiodarone (PACERONE) 200 MG tablet, Take 1 tablet (200 mg total) by mouth daily., Disp: , Rfl: ;  apixaban (ELIQUIS) 5 MG TABS tablet, Take 1 tablet (5 mg total) by  mouth 2 (two) times daily., Disp: 60 tablet, Rfl: ;  Ascorbic Acid (VITAMIN C) 500 MG CAPS, Take 500 mg by mouth daily. , Disp: , Rfl:  budesonide-formoterol (SYMBICORT) 160-4.5 MCG/ACT inhaler, Inhale 2 puffs into the lungs 2 (two) times daily., Disp: , Rfl: ;  Ca & Phos-Vit D-Mag (CALCIUM) [phone] TABS, Take 1 tablet by mouth daily. , Disp: , Rfl: ;  calcium-vitamin D (OSCAL WITH D) 500-200 MG-UNIT per tablet, Take 1 tablet by mouth daily with breakfast., Disp: , Rfl: ;  Cholecalciferol (VITAMIN D) 2000 UNITS tablet, Take 2,000 Units by mouth daily., Disp: , Rfl:  diltiazem (CARDIZEM CD) 180 MG 24 hr capsule, Take 1 capsule (180 mg total)  by mouth daily., Disp: 30 capsule, Rfl: 3;  ezetimibe-simvastatin (VYTORIN) 10-40 MG per tablet, Take 1 tablet by mouth at bedtime. , Disp: , Rfl: ;  ferrous sulfate 325 (65 FE) MG tablet, Take 325 mg by mouth daily with breakfast., Disp: , Rfl:  furosemide (LASIX) 80 MG tablet, Take 2 tablets (160 mg total) by mouth 2 (two) times daily. MAKE SURE TO TAKE AT 8 AM AND 2 PM, Disp: , Rfl: ;  glyBURIDE micronized (GLYNASE) 3 MG tablet, Take 3 mg by mouth daily. , Disp: , Rfl: ;  insulin aspart (NOVOLOG) 100 UNIT/ML injection, Inject 3 Units into the skin 3 (three) times daily with meals., Disp: 10 mL, Rfl: 11 levalbuterol (XOPENEX HFA) 45 MCG/ACT inhaler, Inhale 2 puffs into the lungs every 6 (six) hours as needed for wheezing., Disp: 1 Inhaler, Rfl: 12;  levalbuterol (XOPENEX) 0.63 MG/3ML nebulizer solution, Take 3 mLs (0.63 mg total) by nebulization every 6 (six) hours as needed for wheezing or shortness of breath., Disp: 3 mL, Rfl: 12;  LORazepam (ATIVAN) 0.5 MG tablet, Take 0.5 mg by mouth every 4 (four) hours as needed for anxiety., Disp: , Rfl:  Magnesium-Zinc 133.33-5 MG TABS, Take 133 mg by mouth daily., Disp: , Rfl: ;  metoprolol (LOPRESSOR) 50 MG tablet, Take 1 tablet (50 mg total) by mouth 2 (two) times daily., Disp: 60 tablet, Rfl: 3;  Multiple Vitamins-Iron (MULTIVITAMIN/IRON PO), Take 1 tablet by mouth daily., Disp: , Rfl: ;  omeprazole (PRILOSEC) 20 MG capsule, Take 20 mg by mouth daily., Disp: , Rfl:  potassium chloride SA (K-DUR,KLOR-CON) 20 MEQ tablet, Take 20 mEq by mouth 3 (three) times daily. MAKE SURE TO TAKE EXTRA 20 MEQ ON METOLAZONE DAYS, Disp: , Rfl: ;  sitaGLIPtin (JANUVIA) 100 MG tablet, Take 100 mg by mouth daily., Disp: , Rfl: ;  sodium chloride (OCEAN) 0.65 % SOLN nasal spray, Place 1 spray into both nostrils as needed for congestion., Disp: , Rfl:  tiotropium (SPIRIVA) 18 MCG inhalation capsule, Place 18 mcg into inhaler and inhale daily., Disp: , Rfl: ;  vitamin B-12  (CYANOCOBALAMIN) 500 MCG tablet, Take 500 mcg by mouth daily., Disp: , Rfl: :  :  Allergies  Allergen Reactions  . Ace Inhibitors Cough  . Clindamycin Rash  :  Family History  Problem Relation Age of Onset  . Breast cancer Sister   . Heart disease    . Hodgkin's lymphoma    . Asthma Mother   :  History   Social History  . Marital Status: Legally Separated    Spouse Name: N/A    Number of Children: N/A  . Years of Education: N/A   Occupational History  . retired from [employer]    Social History Main Topics  . Smoking status: Former Smoker --  3.00 packs/day for 45 years    Types: Cigarettes    Start date: 11/08/1963    Quit date: 12/28/2008  . Smokeless tobacco: Never Used     Comment: quit smoking 5 years ago  . Alcohol Use: Yes     Comment: 01/14/2014 "used to drink socially in the past; last drink was probably 10 yr ago"  . Drug Use: No  . Sexual Activity: Not Currently   Other Topics Concern  . Not on file   Social History Narrative  . No narrative on file  :  Pertinent items are noted in HPI.  Exam: @IPVITALS @  obese [place] female in no obvious distress. Vital signs show temperature of 97.6. Pulse 55. Blood pressure 119/40. Weight is 274 pounds. Head and neck exam shows no ocular or oral lesions. She has no palpable cervical or supraclavicular lymph nodes. Lungs are clear. Chest decreased at the bases. Cardiac exam regular rate and rhythm. She is a 2/6 systolic ejection murmur. Abdomen is obese but soft. Has good bowel sounds. There is no fluid wave. There is no abdominal mass. There is no palpable liver or spleen tip. Back exam no tenderness over the spine ribs or hips. Extremities shows chronic pitting edema in her lower legs. She has some stasis dermatitis changes in her lower legs. Skin exam shows no ecchymoses or petechia. Neurological exam is nonfocal.    Recent Labs  05/08/14 1144  WBC 10.4*  HGB 9.3*  HCT 31.4*  PLT 288    Recent Labs   05/05/14 1542  NA 137  K 4.4  CL 93*  CO2 34*  GLUCOSE 187*  BUN 27*  CREATININE 1.42*  CALCIUM 9.7    Blood smear review: Microcytic and slight hypochromic red cells. She has no target cells. I see no nucleated red cells. She has no teardrop cells. She has no rouleau formation. There are no schistocytes or spherocytes. [place] cells per normal in morphology maturation. There are no hypersegmented polys. I see no immature myeloid cells. Platelets are adequate in number and size.  Pathology: No data     Assessment and Plan: 68 year old [place] female with multiple medical problems. She's on multiple medications. She has anemia.  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 She has long-standing diabetes. She has mild renal insufficiency. Again, it would shock me if she is not erythropoietin deficient. She had iron studies done about a month ago and this showed iron deficiency. I don't think she ever has had IV iron. Again, oral iron is not going to work for her because of her medications.  I really believe that we can improve her anemia. I think if we can get her anemia better, she will have this issues with heart failure and COPD exacerbation.  She is off all nice. I gave her a prayer blanket.  On her blood smear I really do not see anything that looked suspicious for any myelodysplasia or other bone marrow disorder.  I did talk to her about having a mammogram. She has not had one in 5 or 6 years. Again she does not want any genetic testing.  Once we get the results back from the iron studies, she will get back here in and we will be able to improve her anemia.  I spent a good hour with her. Again she is very nice.

## 2014-05-11 LAB — IRON AND TIBC CHCC
%SAT: 14 % — AB (ref 21–57)
Iron: 48 ug/dL (ref 41–142)
TIBC: 340 ug/dL (ref 236–444)
UIBC: 292 ug/dL (ref 120–384)

## 2014-05-11 LAB — FERRITIN CHCC: Ferritin: 79 ng/ml (ref 9–269)

## 2014-05-11 LAB — ERYTHROPOIETIN: Erythropoietin: 28.8 m[IU]/mL — ABNORMAL HIGH (ref 2.6–18.5)

## 2014-05-11 LAB — RETICULOCYTES (CHCC)
ABS Retic: 74.8 10*3/uL (ref 19.0–186.0)
RBC.: 3.56 MIL/uL — ABNORMAL LOW (ref 3.87–5.11)
RETIC CT PCT: 2.1 % (ref 0.4–2.3)

## 2014-05-14 ENCOUNTER — Ambulatory Visit (HOSPITAL_COMMUNITY)
Admission: RE | Admit: 2014-05-14 | Discharge: 2014-05-14 | Disposition: A | Discharge status: Home / Self Care | Payer: No Typology Code available for payment source | Source: Ambulatory Visit | Attending: Internal Medicine | Admitting: Internal Medicine

## 2014-05-14 ENCOUNTER — Encounter (HOSPITAL_COMMUNITY): Payer: Self-pay

## 2014-05-14 VITALS — BP 105/50 | HR 60 | Resp 22 | Wt 275.0 lb

## 2014-05-14 DIAGNOSIS — N183 Chronic kidney disease, stage 3 unspecified: Secondary | ICD-10-CM

## 2014-05-14 DIAGNOSIS — I5032 Chronic diastolic (congestive) heart failure: Secondary | ICD-10-CM | POA: Diagnosis not present

## 2014-05-14 DIAGNOSIS — I480 Paroxysmal atrial fibrillation: Secondary | ICD-10-CM

## 2014-05-14 DIAGNOSIS — I4891 Unspecified atrial fibrillation: Secondary | ICD-10-CM

## 2014-05-14 DIAGNOSIS — E669 Obesity, unspecified: Secondary | ICD-10-CM | POA: Insufficient documentation

## 2014-05-14 DIAGNOSIS — I509 Heart failure, unspecified: Secondary | ICD-10-CM | POA: Insufficient documentation

## 2014-05-14 DIAGNOSIS — E119 Type 2 diabetes mellitus without complications: Secondary | ICD-10-CM | POA: Insufficient documentation

## 2014-05-14 DIAGNOSIS — D509 Iron deficiency anemia, unspecified: Secondary | ICD-10-CM | POA: Diagnosis not present

## 2014-05-14 DIAGNOSIS — Z794 Long term (current) use of insulin: Secondary | ICD-10-CM | POA: Insufficient documentation

## 2014-05-14 DIAGNOSIS — Z7901 Long term (current) use of anticoagulants: Secondary | ICD-10-CM | POA: Insufficient documentation

## 2014-05-14 DIAGNOSIS — Z9981 Dependence on supplemental oxygen: Secondary | ICD-10-CM | POA: Insufficient documentation

## 2014-05-14 DIAGNOSIS — J4489 Other specified chronic obstructive pulmonary disease: Secondary | ICD-10-CM | POA: Insufficient documentation

## 2014-05-14 DIAGNOSIS — I129 Hypertensive chronic kidney disease with stage 1 through stage 4 chronic kidney disease, or unspecified chronic kidney disease: Secondary | ICD-10-CM | POA: Insufficient documentation

## 2014-05-14 DIAGNOSIS — D649 Anemia, unspecified: Secondary | ICD-10-CM | POA: Insufficient documentation

## 2014-05-14 DIAGNOSIS — Z87891 Personal history of nicotine dependence: Secondary | ICD-10-CM | POA: Insufficient documentation

## 2014-05-14 DIAGNOSIS — E785 Hyperlipidemia, unspecified: Secondary | ICD-10-CM | POA: Insufficient documentation

## 2014-05-14 DIAGNOSIS — J449 Chronic obstructive pulmonary disease, unspecified: Secondary | ICD-10-CM | POA: Insufficient documentation

## 2014-05-14 DIAGNOSIS — Z79899 Other long term (current) drug therapy: Secondary | ICD-10-CM | POA: Insufficient documentation

## 2014-05-14 NOTE — Progress Notes (Signed)
Patient ID: Paige Hardin, female   DOB: [birth year], 67 y.o.   MRN: [id number] PCP: Dr. Tollie Pizza  68 yo with history of COPD on home oxygen, paroxysmal atrial fibrillation, and chronic diastolic CHF presents today for followup. She was admitted for several weeks from 3/15 to 4/15 with atrial fibrillation/RVR, acute on chronic diastolic CHF, and COPD exacerbation.  She ended up being cardioverted in the hospital.  She was diuresed with IV Lasix.  After the prolonged hospitalization, she was sent to [facility].  She was again admitted at the end of 4/15 and hospitalized into 5/15 with acute on chronic diastolic CHF.  She was intubated this time and went back into atrial fibrillation with RVR.  She was cardioverted back to NSR and is maintained on amiodarone.  She was again diuresed and discharged to SNF [facility]).    Follow up for Heart Failure: Here with cousin. Reports she is doing well. Denies PND or CP. Breathing is at baseline for her. + orthopnea sleeps in recliner. Denies palpitations. At [facility] facility which she will be discharged to assisted living [facility] on Saturday. Still working with PT and walking with a walker about 300 ft. Weight 273-275 lbs.  Labs (4/15): K 4.4, creatinine 1.4 Labs (5/15): creatinine 1.3, hemoglobin 8.5 Labs (6/15): K 3.6, creatinine 1.6          (6/15): K 4.4, creatinine 1.42, TSH 3.69, pro-BNP 999, AST 15, ALT 14  1. Chronic low back pain.  2. COPD: On home oxygen. Prior smoker.  3. Type 2 diabetes.  4. Paroxysmal atrial fibrillation:  DCCV in 2010 and again in 4/15.  She is on warfarin and is on amiodarone to maintain NSR.   5. Hyperlipidemia.  6. Diastolic congestive heart failure: Echo (2/15) with EF 60%, mild MR, PA systolic pressure 53 mmHg. 7. Hypertension.  8. Low back pain 9. Obesity 10. CKD 11. Anemia: Fe deficiency 12. RLL nodule  13. ACEI cough  SOCIAL HISTORY: Prior smoker.  Patient is currently in SNF [facility]).  FAMILY  HISTORY: Non-Hodgkin lymphoma and breast cancer. No early-onset  coronary artery disease.   REVIEW OF SYSTEMS: Negative except as noted in the history of present  Illness.  Current Outpatient Prescriptions  Medication Sig Dispense Refill  . acetaminophen (TYLENOL) 325 MG tablet Take 325 mg by mouth daily as needed for mild pain.       Marland Kitchen amiodarone (PACERONE) 200 MG tablet Take 1 tablet (200 mg total) by mouth daily.      Marland Kitchen apixaban (ELIQUIS) 5 MG TABS tablet Take 1 tablet (5 mg total) by mouth 2 (two) times daily.  60 tablet    . Ascorbic Acid (VITAMIN C) 500 MG CAPS Take 500 mg by mouth daily.       . bisacodyl (DULCOLAX) 10 MG suppository Place 10 mg rectally as needed for moderate constipation.      . budesonide-formoterol (SYMBICORT) 160-4.5 MCG/ACT inhaler Inhale 2 puffs into the lungs 2 (two) times daily.      . Ca & Phos-Vit D-Mag (CALCIUM) [phone] TABS Take 1 tablet by mouth daily.       . calcium-vitamin D (OSCAL WITH D) 500-200 MG-UNIT per tablet Take 1 tablet by mouth daily with breakfast.      . Cholecalciferol (VITAMIN D) 2000 UNITS tablet Take 2,000 Units by mouth daily.      Marland Kitchen diltiazem (CARDIZEM CD) 180 MG 24 hr capsule Take 1 capsule (180 mg total) by mouth daily.  30 capsule  3  . ezetimibe-simvastatin (VYTORIN) 10-40 MG per tablet Take 1 tablet by mouth at bedtime.       . ferrous sulfate 325 (65 FE) MG tablet Take 325 mg by mouth daily with breakfast.      . furosemide (LASIX) 80 MG tablet Take 2 tablets (160 mg total) by mouth 2 (two) times daily. MAKE SURE TO TAKE AT 8 AM AND 2 PM      . glyBURIDE micronized (GLYNASE) 3 MG tablet Take 3 mg by mouth daily.       . insulin aspart (NOVOLOG) 100 UNIT/ML injection Inject 3 Units into the skin 3 (three) times daily with meals.  10 mL  11  . levalbuterol (XOPENEX HFA) 45 MCG/ACT inhaler Inhale 2 puffs into the lungs every 6 (six) hours as needed for wheezing.  1 Inhaler  12  . levalbuterol (XOPENEX) 0.63 MG/3ML nebulizer  solution Take 3 mLs (0.63 mg total) by nebulization every 6 (six) hours as needed for wheezing or shortness of breath.  3 mL  12  . LORazepam (ATIVAN) 0.5 MG tablet Take 0.5 mg by mouth every 4 (four) hours as needed for anxiety.      . metoprolol (LOPRESSOR) 50 MG tablet Take 1 tablet (50 mg total) by mouth 2 (two) times daily.  60 tablet  3  . Multiple Vitamins-Iron (MULTIVITAMIN/IRON PO) Take 1 tablet by mouth daily.      Marland Kitchen omeprazole (PRILOSEC) 20 MG capsule Take 20 mg by mouth daily.      . potassium chloride SA (K-DUR,KLOR-CON) 20 MEQ tablet Take 20 mEq by mouth 3 (three) times daily. MAKE SURE TO TAKE EXTRA 20 MEQ ON METOLAZONE DAYS      . sitaGLIPtin (JANUVIA) 100 MG tablet Take 100 mg by mouth daily.      . sodium chloride (OCEAN) 0.65 % SOLN nasal spray Place 1 spray into both nostrils as needed for congestion.      Marland Kitchen tiotropium (SPIRIVA) 18 MCG inhalation capsule Place 18 mcg into inhaler and inhale daily.      . vitamin B-12 (CYANOCOBALAMIN) 500 MCG tablet Take 500 mcg by mouth daily.      . Magnesium-Zinc 133.33-5 MG TABS Take 133 mg by mouth daily.       No current facility-administered medications for this encounter.    Filed Vitals:   05/14/14 1428  BP: 105/50  Pulse: 60  Resp: 22  Weight: 275 lb (124.739 kg)  SpO2: 96%    General: Elderly appearing , NAD, on 2L, in wheelchair Neck: Thick, difficult to assess d/t body habitus but appears ~ 8, no thyromegaly or thyroid nodule.  Lungs: Distant breath sounds bilaterally CV: Nondisplaced PMI.  Heart regular S1/S2, no S3/S4, 2/6 early SEM RUSB. 1-2+ woody edema to knees bilaterally.  No carotid bruit.  Normal pedal pulses.  Abdomen: Soft, nontender, no hepatosplenomegaly, no distention.  Skin: Intact without lesions or rashes.  Neurologic: Alert and oriented x 3.  Psych: Normal affect. Extremities: No clubbing or cyanosis.   Assessment/Plan: 1. Atrial fibrillation: Paroxysmal.  She appears to be in NSR today on  amiodarone and Eliquis.  Last TSH and LFTs normal (04/2014)  Will need yearly eye exam.  She by EP in hospital most recent admission, recommended against AV nodal ablation/pacing.  2. Chronic diastolic CHF: EF 40% (07/8118)  Volume status better from last visit weight is 4 lbs, however remains mildly volume overloaded. Will continue lasix 160 mg BID and will add metolazone 2.5 mg  PRN for weight gain more than 3 lbs in a day or 5 lbs in a week. Have asked her since she is getting ready to be discharged to Assisted Living to let us know if has to take metolazone frequently because we need to follow Cr closely.  - place TED hose - She has been drinking more than 2L a day. Reinforced the need to follow a low sodium diet, drink less than 2L a day and weigh daily.  4. CKD stage III- baseline Cr 1.3-1.5. Will continue to follow. 5. Anemia: Last Hgb stable 9.3. She is seeing hematology now. No bleeding with Eliquis.   F/U 3 weeks with BMET Junie Bame B 05/14/2014

## 2014-05-14 NOTE — Addendum Note (Signed)
Addended by: Volanda Napoleon on: 05/14/2014 07:28 PM   Modules accepted: Orders

## 2014-05-14 NOTE — Patient Instructions (Signed)
Doing good.  Will add metolazone 2.5 mg as needed for weight gain more than 3 lbs in a day and 5 lbs in week. Please call [facility] [phone] if she needs metolazone.   Place TED hose.   Follow up in 3 weeks  Do the following things EVERYDAY: 1) Weigh yourself in the morning before breakfast. Write it down and keep it in a log. 2) Take your medicines as prescribed 3) Eat low salt foods-Limit salt (sodium) to 2000 mg per day.  4) Stay as active as you can everyday 5) Limit all fluids for the day to less than 2 liters 6)

## 2014-05-15 ENCOUNTER — Telehealth: Payer: Self-pay | Admitting: Hematology & Oncology

## 2014-05-15 NOTE — Telephone Encounter (Signed)
Pt aware of 6-25 iron and 7-23 MD appointments

## 2014-05-19 ENCOUNTER — Ambulatory Visit (INDEPENDENT_AMBULATORY_CARE_PROVIDER_SITE_OTHER): Payer: Medicare Other | Admitting: Pulmonary Disease

## 2014-05-19 ENCOUNTER — Encounter: Payer: Self-pay | Admitting: Pulmonary Disease

## 2014-05-19 VITALS — BP 124/70 | HR 60 | Ht 65.0 in | Wt 270.8 lb

## 2014-05-19 DIAGNOSIS — J438 Other emphysema: Secondary | ICD-10-CM | POA: Diagnosis not present

## 2014-05-19 NOTE — Patient Instructions (Signed)
No change in breathing medications. Keep working on weight loss, and watch your fluid balance daily. [facility] to [facility] to get you a portable concentrator followup with me again in 32mos.

## 2014-05-19 NOTE — Assessment & Plan Note (Signed)
The patient appears to be stable from a pulmonary standpoint since her last visit. She has lost 25 pounds, a lot of which was fluid, and feels that her breathing is better. She is currently wearing her oxygen compliantly, and we'll see about getting her a portable concentrator. I've asked her to continue on her bronchodilator regimen, and will see her back in 6 months. I have stressed to her the importance of daily weights in order to keep her diastolic heart failure well compensated.

## 2014-05-19 NOTE — Progress Notes (Signed)
   Subjective:    Patient ID: Paige Hardin, female    DOB: 09-07-1946, 68 y.o.   MRN: [id number]  HPI Patient comes in today for followup of her known chronic respiratory failure, secondary to moderate COPD, chronic diastolic heart failure, and morbid obesity with deconditioning. She was seen at an acute visit with our nurse practitioner at the last visit, and felt to have worsening diastolic heart failure. She had her Lasix dose increased, and now has been trying to weigh herself daily with close monitoring of her fluid balance. She has lost 25 pounds since the last visit, and feels much improved. She denies any chest congestion, cough, or purulent mucus. She feels that her breathing is better since losing fluid weight.   Review of Systems  Constitutional: Negative for fever and unexpected weight change.  HENT: Negative for congestion, dental problem, ear pain, nosebleeds, postnasal drip, rhinorrhea, sinus pressure, sneezing, sore throat and trouble swallowing.   Eyes: Negative for redness and itching.  Respiratory: Positive for shortness of breath. Negative for cough, chest tightness and wheezing.   Cardiovascular: Positive for leg swelling. Negative for palpitations.  Gastrointestinal: Negative for nausea and vomiting.  Genitourinary: Negative for dysuria.  Musculoskeletal: Negative for joint swelling.  Skin: Negative for rash.  Neurological: Negative for headaches.  Hematological: Bruises/bleeds easily.  Psychiatric/Behavioral: Negative for dysphoric mood. The patient is not nervous/anxious.        Objective:   Physical Exam Obese female in no acute distress Nose without purulence or discharge noted Neck without lymphadenopathy or thyromegaly Chest with a few basilar crackles, no wheezes. Adequate airflow noted Cardiac exam with regular rate and rhythm, 2/6 systolic murmur Lower extremities with 2+ edema, no cyanosis. Alert and oriented, moves all 4 extremities.         Assessment & Plan:

## 2014-05-21 ENCOUNTER — Ambulatory Visit (HOSPITAL_BASED_OUTPATIENT_CLINIC_OR_DEPARTMENT_OTHER): Payer: Medicare Other

## 2014-05-21 VITALS — BP 120/60 | HR 59 | Temp 97.8°F | Resp 20

## 2014-05-21 DIAGNOSIS — D509 Iron deficiency anemia, unspecified: Secondary | ICD-10-CM | POA: Diagnosis not present

## 2014-05-21 DIAGNOSIS — R042 Hemoptysis: Secondary | ICD-10-CM

## 2014-05-21 MED ORDER — SODIUM CHLORIDE 0.9 % IV SOLN
1020.0000 mg | Freq: Once | INTRAVENOUS | Status: AC
  Administered 2014-05-21: 1020 mg via INTRAVENOUS
  Filled 2014-05-21: qty 34

## 2014-05-21 MED ORDER — SODIUM CHLORIDE 0.9 % IV SOLN
Freq: Once | INTRAVENOUS | Status: AC
  Administered 2014-05-21: 11:00:00 via INTRAVENOUS

## 2014-05-21 NOTE — Patient Instructions (Signed)

## 2014-06-02 DIAGNOSIS — Z79899 Other long term (current) drug therapy: Secondary | ICD-10-CM | POA: Diagnosis not present

## 2014-06-03 ENCOUNTER — Encounter (HOSPITAL_COMMUNITY): Payer: Medicare Other

## 2014-06-03 NOTE — Progress Notes (Signed)
Patient ID: SAPIR LAVEY, female   DOB: Oct 04, 1946, 68 y.o.   MRN: [id number] PCP: Dr. Tollie Pizza  68 yo with history of COPD on home oxygen, paroxysmal atrial fibrillation, and chronic diastolic CHF presents today for followup. She was admitted for several weeks from 3/15 to 4/15 with atrial fibrillation/RVR, acute on chronic diastolic CHF, and COPD exacerbation.  She ended up being cardioverted in the hospital.  She was diuresed with IV Lasix.  After the prolonged hospitalization, she was sent to [facility].  She was again admitted at the end of 4/15 and hospitalized into 5/15 with acute on chronic diastolic CHF.  She was intubated this time and went back into atrial fibrillation with RVR.  She was cardioverted back to NSR and is maintained on amiodarone.  She was again diuresed and discharged to SNF [facility]).    Follow up for Heart Failure: Last visit prn metolazone was added but she has not needed it. Weight at [facility] 268 pounds. Overall breathing much improved.  SHe continues to wear 2 liters [place] oxygen. Denies SOB/PND/Orthopnea. Taking all medications. No bleeding problems.  Weight 273-275 lbs. She is limiting fluid and salt intake.   Labs (4/15): K 4.4, creatinine 1.4 Labs (5/15): creatinine 1.3, hemoglobin 8.5 Labs (04/30/14 ): K 3.6, creatinine 1.6          (05/05/14 : K 4.4, creatinine 1.42, TSH 3.69, pro-BNP 999, AST 15, ALT 14  1. Chronic low back pain.  2. COPD: On home oxygen. Prior smoker.  3. Type 2 diabetes.  4. Paroxysmal atrial fibrillation:  DCCV in 2010 and again in 4/15.  She is on warfarin and is on amiodarone to maintain NSR.   5. Hyperlipidemia.  6. Diastolic congestive heart failure: Echo (2/15) with EF 60%, mild MR, PA systolic pressure 53 mmHg. 7. Hypertension.  8. Low back pain 9. Obesity 10. CKD 11. Anemia: Fe deficiency 12. RLL nodule  13. ACEI cough  SOCIAL HISTORY: Prior smoker.  Patient is currently in SNF [facility]).  FAMILY HISTORY: Non-Hodgkin  lymphoma and breast cancer. No early-onset  coronary artery disease.   REVIEW OF SYSTEMS: Negative except as noted in the history of present  Illness.  Current Outpatient Prescriptions  Medication Sig Dispense Refill  . acetaminophen (TYLENOL) 325 MG tablet Take 325 mg by mouth daily as needed for mild pain.       Marland Kitchen amiodarone (PACERONE) 200 MG tablet Take 1 tablet (200 mg total) by mouth daily.      Marland Kitchen apixaban (ELIQUIS) 5 MG TABS tablet Take 1 tablet (5 mg total) by mouth 2 (two) times daily.  60 tablet    . Ascorbic Acid (VITAMIN C) 500 MG CAPS Take 500 mg by mouth daily.       . bisacodyl (DULCOLAX) 10 MG suppository Place 10 mg rectally as needed for moderate constipation.      . budesonide-formoterol (SYMBICORT) 160-4.5 MCG/ACT inhaler Inhale 2 puffs into the lungs 2 (two) times daily.      . Ca & Phos-Vit D-Mag (CALCIUM) [phone] TABS Take 1 tablet by mouth daily.       . calcium-vitamin D (OSCAL WITH D) 500-200 MG-UNIT per tablet Take 1 tablet by mouth daily with breakfast.      . Cholecalciferol (VITAMIN D) 2000 UNITS tablet Take 2,000 Units by mouth daily.      Marland Kitchen diltiazem (CARDIZEM CD) 180 MG 24 hr capsule Take 1 capsule (180 mg total) by mouth daily.  30 capsule  3  .  ezetimibe-simvastatin (VYTORIN) 10-40 MG per tablet Take 1 tablet by mouth at bedtime.       . ferrous sulfate 325 (65 FE) MG tablet Take 325 mg by mouth daily with breakfast.      . furosemide (LASIX) 80 MG tablet Take 2 tablets (160 mg total) by mouth 2 (two) times daily. MAKE SURE TO TAKE AT 8 AM AND 2 PM      . glyBURIDE micronized (GLYNASE) 3 MG tablet Take 3 mg by mouth daily.       . insulin aspart (NOVOLOG) 100 UNIT/ML injection Inject 3 Units into the skin 3 (three) times daily with meals.  10 mL  11  . levalbuterol (XOPENEX HFA) 45 MCG/ACT inhaler Inhale 2 puffs into the lungs every 6 (six) hours as needed for wheezing.  1 Inhaler  12  . levalbuterol (XOPENEX) 0.63 MG/3ML nebulizer solution Take 3 mLs  (0.63 mg total) by nebulization every 6 (six) hours as needed for wheezing or shortness of breath.  3 mL  12  . LORazepam (ATIVAN) 0.5 MG tablet Take 0.5 mg by mouth every 4 (four) hours as needed for anxiety.      . Magnesium-Zinc 133.33-5 MG TABS Take 133 mg by mouth daily.      . metolazone (ZAROXOLYN) 2.5 MG tablet Take 2.5 mg by mouth daily as needed. Weight gain 3 lbs in 1 day or 5 lbs in 1 wk      . metoprolol (LOPRESSOR) 50 MG tablet Take 1 tablet (50 mg total) by mouth 2 (two) times daily.  60 tablet  3  . Multiple Vitamins-Iron (MULTIVITAMIN/IRON PO) Take 1 tablet by mouth daily.      Marland Kitchen nystatin cream (MYCOSTATIN) Apply 1 application topically 2 (two) times daily. Abdominal folds twice daily for rash      . omeprazole (PRILOSEC) 20 MG capsule Take 20 mg by mouth daily.      . potassium chloride SA (K-DUR,KLOR-CON) 20 MEQ tablet Take 20 mEq by mouth 3 (three) times daily. MAKE SURE TO TAKE EXTRA 20 MEQ ON METOLAZONE DAYS      . PROAIR HFA 108 (90 BASE) MCG/ACT inhaler 1 puff every 4 (four) hours as needed.      . sitaGLIPtin (JANUVIA) 100 MG tablet Take 100 mg by mouth daily.      . sodium chloride (OCEAN) 0.65 % SOLN nasal spray Place 1 spray into both nostrils as needed for congestion.      Marland Kitchen tiotropium (SPIRIVA) 18 MCG inhalation capsule Place 18 mcg into inhaler and inhale daily.      . vitamin B-12 (CYANOCOBALAMIN) 500 MCG tablet Take 500 mcg by mouth daily.       No current facility-administered medications for this encounter.    Filed Vitals:   06/04/14 1154  Pulse: 57  Resp: 22  Weight: 268 lb 6 oz (121.734 kg)  SpO2: 90%    General: Elderly appearing , NAD, on 2L, in wheelchair Neck: Thick, difficult to assess d/t body habitus but appears ~ 8, no thyromegaly or thyroid nodule.  Lungs: Distant breath sounds bilaterally CV: Nondisplaced PMI.  Heart regular S1/S2, no S3/S4, 2/6 early SEM RUSB. 1-2+ woody edema to knees bilaterally.  No carotid bruit.  Normal pedal pulses.   Abdomen: Soft, nontender, no hepatosplenomegaly, no distention.  Skin: Intact without lesions or rashes.  Neurologic: Alert and oriented x 3.  Psych: Normal affect. Extremities: No clubbing or cyanosis.   Assessment/Plan: 1. Atrial fibrillation: Paroxysmal.  Regular  Rate rhythm. Continue 200 mg  Amiodarone daily and  and Eliquis 5 mg twice a day.  Last TSH and LFTs normal (04/2014)  Will need yearly eye exam.  She was evaluated by EP in hospital most recent admission, recommended against AV nodal ablation/pacing.  2. Chronic diastolic CHF: EF 68% (11/1570)  Volume status stable.  Continue lasix 160 mg BID and will add metolazone 2.5 mg PRN for weight gain more than 3 lbs in a day or 5 lbs in a week. - She has been drinking more than 2L a day. Reinforced the need to follow a low sodium diet, drink less than 2L a day and weigh daily.  4. CKD stage III- baseline Cr 1.3-1.5. Will continue to follow. Check BMET today.  5. Anemia: Per Dr Marin Olp.   Follow up in 3 months.  CLEGG,AMY NP-C  06/04/2014

## 2014-06-04 ENCOUNTER — Encounter (HOSPITAL_COMMUNITY): Payer: Self-pay

## 2014-06-04 ENCOUNTER — Ambulatory Visit (HOSPITAL_COMMUNITY)
Admission: RE | Admit: 2014-06-04 | Discharge: 2014-06-04 | Disposition: A | Discharge status: Home / Self Care | Payer: Medicare Other | Source: Ambulatory Visit | Attending: Adult Health | Admitting: Adult Health

## 2014-06-04 VITALS — BP 114/43 | HR 57 | Resp 22 | Wt 268.4 lb

## 2014-06-04 DIAGNOSIS — D649 Anemia, unspecified: Secondary | ICD-10-CM | POA: Diagnosis not present

## 2014-06-04 DIAGNOSIS — I4891 Unspecified atrial fibrillation: Secondary | ICD-10-CM | POA: Diagnosis not present

## 2014-06-04 DIAGNOSIS — Z794 Long term (current) use of insulin: Secondary | ICD-10-CM | POA: Diagnosis not present

## 2014-06-04 DIAGNOSIS — J449 Chronic obstructive pulmonary disease, unspecified: Secondary | ICD-10-CM | POA: Diagnosis not present

## 2014-06-04 DIAGNOSIS — G8929 Other chronic pain: Secondary | ICD-10-CM | POA: Insufficient documentation

## 2014-06-04 DIAGNOSIS — Z09 Encounter for follow-up examination after completed treatment for conditions other than malignant neoplasm: Secondary | ICD-10-CM | POA: Diagnosis not present

## 2014-06-04 DIAGNOSIS — E119 Type 2 diabetes mellitus without complications: Secondary | ICD-10-CM | POA: Diagnosis not present

## 2014-06-04 DIAGNOSIS — I129 Hypertensive chronic kidney disease with stage 1 through stage 4 chronic kidney disease, or unspecified chronic kidney disease: Secondary | ICD-10-CM | POA: Insufficient documentation

## 2014-06-04 DIAGNOSIS — I509 Heart failure, unspecified: Secondary | ICD-10-CM | POA: Diagnosis not present

## 2014-06-04 DIAGNOSIS — J4489 Other specified chronic obstructive pulmonary disease: Secondary | ICD-10-CM | POA: Insufficient documentation

## 2014-06-04 DIAGNOSIS — N183 Chronic kidney disease, stage 3 unspecified: Secondary | ICD-10-CM | POA: Diagnosis not present

## 2014-06-04 DIAGNOSIS — Z9981 Dependence on supplemental oxygen: Secondary | ICD-10-CM | POA: Diagnosis not present

## 2014-06-04 DIAGNOSIS — Z87891 Personal history of nicotine dependence: Secondary | ICD-10-CM | POA: Insufficient documentation

## 2014-06-04 DIAGNOSIS — M545 Low back pain, unspecified: Secondary | ICD-10-CM | POA: Insufficient documentation

## 2014-06-04 DIAGNOSIS — I5032 Chronic diastolic (congestive) heart failure: Secondary | ICD-10-CM | POA: Diagnosis not present

## 2014-06-04 DIAGNOSIS — Z79899 Other long term (current) drug therapy: Secondary | ICD-10-CM | POA: Insufficient documentation

## 2014-06-04 DIAGNOSIS — E785 Hyperlipidemia, unspecified: Secondary | ICD-10-CM | POA: Insufficient documentation

## 2014-06-04 DIAGNOSIS — B351 Tinea unguium: Secondary | ICD-10-CM | POA: Diagnosis not present

## 2014-06-04 DIAGNOSIS — I480 Paroxysmal atrial fibrillation: Secondary | ICD-10-CM

## 2014-06-04 LAB — BASIC METABOLIC PANEL
ANION GAP: 14 (ref 5–15)
BUN: 29 mg/dL — ABNORMAL HIGH (ref 6–23)
CHLORIDE: 96 meq/L (ref 96–112)
CO2: 28 mEq/L (ref 19–32)
CREATININE: 1.63 mg/dL — AB (ref 0.50–1.10)
Calcium: 9.7 mg/dL (ref 8.4–10.5)
GFR calc Af Amer: 36 mL/min — ABNORMAL LOW (ref >90)
GFR calc non Af Amer: 31 mL/min — ABNORMAL LOW (ref >90)
GLUCOSE: 92 mg/dL (ref 70–99)
Potassium: 4.4 mEq/L (ref 3.7–5.3)
Sodium: 138 mEq/L (ref 137–147)

## 2014-06-18 ENCOUNTER — Ambulatory Visit (HOSPITAL_BASED_OUTPATIENT_CLINIC_OR_DEPARTMENT_OTHER): Payer: Medicare Other | Admitting: Hematology & Oncology

## 2014-06-18 ENCOUNTER — Other Ambulatory Visit (HOSPITAL_BASED_OUTPATIENT_CLINIC_OR_DEPARTMENT_OTHER): Payer: Medicare Other | Admitting: Lab

## 2014-06-18 ENCOUNTER — Ambulatory Visit: Payer: Medicare Other

## 2014-06-18 ENCOUNTER — Encounter: Payer: Self-pay | Admitting: Hematology & Oncology

## 2014-06-18 VITALS — BP 127/50 | HR 54 | Temp 97.5°F | Resp 16 | Ht 65.0 in | Wt 270.0 lb

## 2014-06-18 DIAGNOSIS — N039 Chronic nephritic syndrome with unspecified morphologic changes: Secondary | ICD-10-CM | POA: Diagnosis not present

## 2014-06-18 DIAGNOSIS — D509 Iron deficiency anemia, unspecified: Secondary | ICD-10-CM

## 2014-06-18 DIAGNOSIS — D631 Anemia in chronic kidney disease: Secondary | ICD-10-CM

## 2014-06-18 LAB — CBC WITH DIFFERENTIAL (CANCER CENTER ONLY)
BASO#: 0 10*3/uL (ref 0.0–0.2)
BASO%: 0.4 % (ref 0.0–2.0)
EOS%: 2.7 % (ref 0.0–7.0)
Eosinophils Absolute: 0.2 10*3/uL (ref 0.0–0.5)
HEMATOCRIT: 36 % (ref 34.8–46.6)
HEMOGLOBIN: 11.4 g/dL — AB (ref 11.6–15.9)
LYMPH#: 0.8 10*3/uL — AB (ref 0.9–3.3)
LYMPH%: 9.4 % — ABNORMAL LOW (ref 14.0–48.0)
MCH: 28.9 pg (ref 26.0–34.0)
MCHC: 31.7 g/dL — ABNORMAL LOW (ref 32.0–36.0)
MCV: 91 fL (ref 81–101)
MONO#: 0.5 10*3/uL (ref 0.1–0.9)
MONO%: 6.3 % (ref 0.0–13.0)
NEUT#: 6.8 10*3/uL — ABNORMAL HIGH (ref 1.5–6.5)
NEUT%: 81.2 % — AB (ref 39.6–80.0)
Platelets: 267 10*3/uL (ref 145–400)
RBC: 3.95 10*6/uL (ref 3.70–5.32)
RDW: 17.8 % — ABNORMAL HIGH (ref 11.1–15.7)
WBC: 8.3 10*3/uL (ref 3.9–10.0)

## 2014-06-18 LAB — COMPREHENSIVE METABOLIC PANEL
ALK PHOS: 69 U/L (ref 39–117)
ALT: 19 U/L (ref 0–35)
AST: 19 U/L (ref 0–37)
Albumin: 3.9 g/dL (ref 3.5–5.2)
BUN: 24 mg/dL — AB (ref 6–23)
CO2: 30 mEq/L (ref 19–32)
CREATININE: 1.55 mg/dL — AB (ref 0.50–1.10)
Calcium: 9.4 mg/dL (ref 8.4–10.5)
Chloride: 98 mEq/L (ref 96–112)
Glucose, Bld: 131 mg/dL — ABNORMAL HIGH (ref 70–99)
Potassium: 4.1 mEq/L (ref 3.5–5.3)
Sodium: 138 mEq/L (ref 135–145)
Total Bilirubin: 0.4 mg/dL (ref 0.2–1.2)
Total Protein: 6.6 g/dL (ref 6.0–8.3)

## 2014-06-18 LAB — RETICULOCYTES (CHCC)
ABS Retic: 40.2 10*3/uL (ref 19.0–186.0)
RBC.: 4.02 MIL/uL (ref 3.87–5.11)
RETIC CT PCT: 1 % (ref 0.4–2.3)

## 2014-06-19 LAB — IRON AND TIBC CHCC
%SAT: 33 % (ref 21–57)
Iron: 78 ug/dL (ref 41–142)
TIBC: 240 ug/dL (ref 236–444)
UIBC: 162 ug/dL (ref 120–384)

## 2014-06-19 LAB — FERRITIN CHCC: Ferritin: 440 ng/ml — ABNORMAL HIGH (ref 9–269)

## 2014-06-19 NOTE — Progress Notes (Signed)
Hematology and Oncology Follow Up Visit  Paige Hardin [id number] 1946-08-16 68 y.o. 06/19/2014   Principle Diagnosis:   Anemia secondary to iron deficiency  Anemia secondary to renal insufficiency  Chronic anticoagulation for paroxysmal atrial fibrillation  Current Therapy:    IV iron as indicated  Aranesp 300 mcg subcutaneous as needed for hemoglobin less than 11  ELIQUIS-long-term     Interim History:  Paige Hardin is in for her second office visit  We first saw her back in June. At that point in time, we noted that she was iron deficient. Her ferritin was only 79 with an iron saturation of 14%. For her, with all of her health issues, this was quite low.  Her erythropoietin level was only 29.  We went ahead and gave her IV iron. She feels a lot better. She feels more active. She is breathing better. She does not have as much swelling. Again, because her hemoglobin is better, she has less cardiac issues.  She's not hurting. She still wears supplemental oxygen.  Medications: Current outpatient prescriptions:acetaminophen (TYLENOL) 325 MG tablet, Take 325 mg by mouth daily as needed for mild pain. , Disp: , Rfl: ;  amiodarone (PACERONE) 200 MG tablet, Take 1 tablet (200 mg total) by mouth daily., Disp: , Rfl: ;  apixaban (ELIQUIS) 5 MG TABS tablet, Take 1 tablet (5 mg total) by mouth 2 (two) times daily., Disp: 60 tablet, Rfl: ;  Ascorbic Acid (VITAMIN C) 500 MG CAPS, Take 500 mg by mouth daily. , Disp: , Rfl:  bisacodyl (DULCOLAX) 10 MG suppository, Place 10 mg rectally as needed for moderate constipation., Disp: , Rfl: ;  budesonide-formoterol (SYMBICORT) 160-4.5 MCG/ACT inhaler, Inhale 2 puffs into the lungs 2 (two) times daily., Disp: , Rfl: ;  Ca & Phos-Vit D-Mag (CALCIUM) [phone] TABS, Take 1 tablet by mouth daily. , Disp: , Rfl:  calcium-vitamin D (OSCAL WITH D) 500-200 MG-UNIT per tablet, Take 1 tablet by mouth daily with breakfast., Disp: , Rfl: ;  Cholecalciferol  (VITAMIN D) 2000 UNITS tablet, Take 2,000 Units by mouth daily., Disp: , Rfl: ;  diltiazem (CARDIZEM CD) 180 MG 24 hr capsule, Take 1 capsule (180 mg total) by mouth daily., Disp: 30 capsule, Rfl: 3;  ezetimibe-simvastatin (VYTORIN) 10-40 MG per tablet, Take 1 tablet by mouth at bedtime. , Disp: , Rfl:  furosemide (LASIX) 80 MG tablet, Take 2 tablets (160 mg total) by mouth 2 (two) times daily. MAKE SURE TO TAKE AT 8 AM AND 2 PM, Disp: , Rfl: ;  glyBURIDE micronized (GLYNASE) 3 MG tablet, Take 3 mg by mouth daily. , Disp: , Rfl: ;  insulin aspart (NOVOLOG) 100 UNIT/ML injection, Inject 3 Units into the skin 3 (three) times daily with meals., Disp: 10 mL, Rfl: 11 levalbuterol (XOPENEX HFA) 45 MCG/ACT inhaler, Inhale 2 puffs into the lungs every 6 (six) hours as needed for wheezing., Disp: 1 Inhaler, Rfl: 12;  levalbuterol (XOPENEX) 0.63 MG/3ML nebulizer solution, Take 3 mLs (0.63 mg total) by nebulization every 6 (six) hours as needed for wheezing or shortness of breath., Disp: 3 mL, Rfl: 12;  LORazepam (ATIVAN) 0.5 MG tablet, Take 0.5 mg by mouth every 4 (four) hours as needed for anxiety., Disp: , Rfl:  Magnesium-Zinc 133.33-5 MG TABS, Take 133 mg by mouth daily., Disp: , Rfl: ;  metolazone (ZAROXOLYN) 2.5 MG tablet, Take 2.5 mg by mouth daily as needed. Weight gain 3 lbs in 1 day or 5 lbs in 1 wk, Disp: ,  Rfl: ;  metoprolol (LOPRESSOR) 50 MG tablet, Take 1 tablet (50 mg total) by mouth 2 (two) times daily., Disp: 60 tablet, Rfl: 3;  Multiple Vitamins-Iron (MULTIVITAMIN/IRON PO), Take 1 tablet by mouth daily., Disp: , Rfl:  nystatin cream (MYCOSTATIN), Apply 1 application topically 2 (two) times daily. Abdominal folds twice daily for rash, Disp: , Rfl: ;  omeprazole (PRILOSEC) 20 MG capsule, Take 20 mg by mouth daily., Disp: , Rfl: ;  potassium chloride SA (K-DUR,KLOR-CON) 20 MEQ tablet, Take 20 mEq by mouth 3 (three) times daily. MAKE SURE TO TAKE EXTRA 20 MEQ ON METOLAZONE DAYS, Disp: , Rfl:  PROAIR HFA 108  (90 BASE) MCG/ACT inhaler, 1 puff every 4 (four) hours as needed., Disp: , Rfl: ;  sitaGLIPtin (JANUVIA) 100 MG tablet, Take 100 mg by mouth daily., Disp: , Rfl: ;  sodium chloride (OCEAN) 0.65 % SOLN nasal spray, Place 1 spray into both nostrils as needed for congestion., Disp: , Rfl: ;  tiotropium (SPIRIVA) 18 MCG inhalation capsule, Place 18 mcg into inhaler and inhale daily., Disp: , Rfl:  vitamin B-12 (CYANOCOBALAMIN) 500 MCG tablet, Take 500 mcg by mouth daily., Disp: , Rfl:   Allergies:  Allergies  Allergen Reactions  . Ace Inhibitors Cough  . Clindamycin Rash    Past Medical History, Surgical history, Social history, and Family History were reviewed and updated.  Review of Systems: As above  Physical Exam:  height is 5\' 5"  (1.651 m) and weight is 270 lb (122.471 kg). Her oral temperature is 97.5 F (36.4 C). Her blood pressure is 127/50 and her pulse is 54. Her respiration is 16 and oxygen saturation is 94%.   Obese Troeger female in no obvious distress. Head and neck exam shows no ocular or oral lesion. She has no palpable cervical or supraclavicular lymph nodes. Lungs show scattered crackles bilaterally. She decent air. Cardiac exam regular rate and rhythm. I did not detect atrial fibrillation. She is a 1/6 systolic murmur. Abdomen is soft. Has good bowel sounds. There is no fluid wave. There is no palpable liver or spleen tip. Extremities shows some chronic stasis dermatitis type changes in her lower extremities. She has mild nonpitting edema in her lower legs. She is osteo- arthritic changes in her joints. Muscle strength is 4/5 bilaterally. Skin exam shows no rashes, ecchymoses or petechia. Neurological exam is non-focal.  Lab Results  Component Value Date   WBC 8.3 06/18/2014   HGB 11.4* 06/18/2014   HCT 36.0 06/18/2014   MCV 91 06/18/2014   PLT 267 06/18/2014     Chemistry      Component Value Date/Time   NA 138 06/18/2014 1200   K 4.1 06/18/2014 1200   CL 98 06/18/2014 1200    CO2 30 06/18/2014 1200   BUN 24* 06/18/2014 1200   CREATININE 1.55* 06/18/2014 1200      Component Value Date/Time   CALCIUM 9.4 06/18/2014 1200   ALKPHOS 69 06/18/2014 1200   AST 19 06/18/2014 1200   ALT 19 06/18/2014 1200   BILITOT 0.4 06/18/2014 1200         Impression and Plan: Paige Hardin is 68 year old [place] female. She is multiple medical problems. Again if she has iron deficiency. I'm sure she probably does not absorb iron. She'll Suppiah some chronic low grade GI bleeding from the anticoagulation.  She has responded incredibly well to IV iron. Her quality of life is better.  We will see what her iron studies are. I would  not think she would need another dose right now.  We do not have to give her Aranesp.  I looked at her blood smear. The red cells looked better. I did not see as much anisocytosis. I do not see target cells.  We will plan to do back now in about 4 weeks. I want to make sure we stay in close contact with her so we can keep her hemoglobin up which clearly will benefit her cardiopulmonary function.  We spent a good 25-30 minutes with her today. We reviewed all of her lab work. She is very pleased with how much better she feels.   Volanda Napoleon, MD 7/24/20157:02 AM

## 2014-06-22 ENCOUNTER — Encounter: Payer: Self-pay | Admitting: *Deleted

## 2014-06-23 DIAGNOSIS — I509 Heart failure, unspecified: Secondary | ICD-10-CM | POA: Diagnosis not present

## 2014-06-23 DIAGNOSIS — J449 Chronic obstructive pulmonary disease, unspecified: Secondary | ICD-10-CM | POA: Diagnosis not present

## 2014-06-23 DIAGNOSIS — R279 Unspecified lack of coordination: Secondary | ICD-10-CM | POA: Diagnosis not present

## 2014-06-23 DIAGNOSIS — R262 Difficulty in walking, not elsewhere classified: Secondary | ICD-10-CM | POA: Diagnosis not present

## 2014-06-23 DIAGNOSIS — M6281 Muscle weakness (generalized): Secondary | ICD-10-CM | POA: Diagnosis not present

## 2014-06-24 DIAGNOSIS — I509 Heart failure, unspecified: Secondary | ICD-10-CM | POA: Diagnosis not present

## 2014-06-24 DIAGNOSIS — R279 Unspecified lack of coordination: Secondary | ICD-10-CM | POA: Diagnosis not present

## 2014-06-24 DIAGNOSIS — R262 Difficulty in walking, not elsewhere classified: Secondary | ICD-10-CM | POA: Diagnosis not present

## 2014-06-24 DIAGNOSIS — J449 Chronic obstructive pulmonary disease, unspecified: Secondary | ICD-10-CM | POA: Diagnosis not present

## 2014-06-24 DIAGNOSIS — M6281 Muscle weakness (generalized): Secondary | ICD-10-CM | POA: Diagnosis not present

## 2014-06-25 DIAGNOSIS — I509 Heart failure, unspecified: Secondary | ICD-10-CM | POA: Diagnosis not present

## 2014-06-25 DIAGNOSIS — R262 Difficulty in walking, not elsewhere classified: Secondary | ICD-10-CM | POA: Diagnosis not present

## 2014-06-25 DIAGNOSIS — M6281 Muscle weakness (generalized): Secondary | ICD-10-CM | POA: Diagnosis not present

## 2014-06-25 DIAGNOSIS — J449 Chronic obstructive pulmonary disease, unspecified: Secondary | ICD-10-CM | POA: Diagnosis not present

## 2014-06-25 DIAGNOSIS — R279 Unspecified lack of coordination: Secondary | ICD-10-CM | POA: Diagnosis not present

## 2014-06-26 DIAGNOSIS — J449 Chronic obstructive pulmonary disease, unspecified: Secondary | ICD-10-CM | POA: Diagnosis not present

## 2014-06-26 DIAGNOSIS — I509 Heart failure, unspecified: Secondary | ICD-10-CM | POA: Diagnosis not present

## 2014-06-26 DIAGNOSIS — M6281 Muscle weakness (generalized): Secondary | ICD-10-CM | POA: Diagnosis not present

## 2014-06-26 DIAGNOSIS — R279 Unspecified lack of coordination: Secondary | ICD-10-CM | POA: Diagnosis not present

## 2014-06-26 DIAGNOSIS — R262 Difficulty in walking, not elsewhere classified: Secondary | ICD-10-CM | POA: Diagnosis not present

## 2014-06-29 DIAGNOSIS — R279 Unspecified lack of coordination: Secondary | ICD-10-CM | POA: Diagnosis not present

## 2014-06-29 DIAGNOSIS — J449 Chronic obstructive pulmonary disease, unspecified: Secondary | ICD-10-CM | POA: Diagnosis not present

## 2014-06-29 DIAGNOSIS — M6281 Muscle weakness (generalized): Secondary | ICD-10-CM | POA: Diagnosis not present

## 2014-06-29 DIAGNOSIS — I509 Heart failure, unspecified: Secondary | ICD-10-CM | POA: Diagnosis not present

## 2014-06-29 DIAGNOSIS — R262 Difficulty in walking, not elsewhere classified: Secondary | ICD-10-CM | POA: Diagnosis not present

## 2014-07-02 DIAGNOSIS — I5032 Chronic diastolic (congestive) heart failure: Secondary | ICD-10-CM | POA: Diagnosis not present

## 2014-07-02 DIAGNOSIS — E119 Type 2 diabetes mellitus without complications: Secondary | ICD-10-CM | POA: Diagnosis not present

## 2014-07-02 DIAGNOSIS — Z6841 Body Mass Index (BMI) 40.0 and over, adult: Secondary | ICD-10-CM | POA: Diagnosis not present

## 2014-07-02 DIAGNOSIS — J449 Chronic obstructive pulmonary disease, unspecified: Secondary | ICD-10-CM | POA: Diagnosis not present

## 2014-07-16 ENCOUNTER — Encounter: Payer: Self-pay | Admitting: Family

## 2014-07-16 ENCOUNTER — Other Ambulatory Visit (HOSPITAL_BASED_OUTPATIENT_CLINIC_OR_DEPARTMENT_OTHER): Payer: Medicare Other | Admitting: Lab

## 2014-07-16 ENCOUNTER — Ambulatory Visit (HOSPITAL_BASED_OUTPATIENT_CLINIC_OR_DEPARTMENT_OTHER): Payer: Medicare Other | Admitting: Family

## 2014-07-16 VITALS — BP 141/48 | HR 55 | Temp 97.5°F | Resp 16 | Ht 65.0 in | Wt 266.0 lb

## 2014-07-16 DIAGNOSIS — D509 Iron deficiency anemia, unspecified: Secondary | ICD-10-CM | POA: Diagnosis not present

## 2014-07-16 LAB — CBC WITH DIFFERENTIAL (CANCER CENTER ONLY)
BASO#: 0 10*3/uL (ref 0.0–0.2)
BASO%: 0.3 % (ref 0.0–2.0)
EOS%: 2.3 % (ref 0.0–7.0)
Eosinophils Absolute: 0.2 10*3/uL (ref 0.0–0.5)
HEMATOCRIT: 36.5 % (ref 34.8–46.6)
HEMOGLOBIN: 11.8 g/dL (ref 11.6–15.9)
LYMPH#: 0.8 10*3/uL — AB (ref 0.9–3.3)
LYMPH%: 8.3 % — ABNORMAL LOW (ref 14.0–48.0)
MCH: 29.6 pg (ref 26.0–34.0)
MCHC: 32.3 g/dL (ref 32.0–36.0)
MCV: 92 fL (ref 81–101)
MONO#: 0.7 10*3/uL (ref 0.1–0.9)
MONO%: 6.5 % (ref 0.0–13.0)
NEUT#: 8.3 10*3/uL — ABNORMAL HIGH (ref 1.5–6.5)
NEUT%: 82.6 % — AB (ref 39.6–80.0)
Platelets: 252 10*3/uL (ref 145–400)
RBC: 3.98 10*6/uL (ref 3.70–5.32)
RDW: 17.5 % — ABNORMAL HIGH (ref 11.1–15.7)
WBC: 10 10*3/uL (ref 3.9–10.0)

## 2014-07-16 LAB — RETICULOCYTES (CHCC)
ABS Retic: 40.8 10*3/uL (ref 19.0–186.0)
RBC.: 4.08 MIL/uL (ref 3.87–5.11)
Retic Ct Pct: 1 % (ref 0.4–2.3)

## 2014-07-16 LAB — CHCC SATELLITE - SMEAR

## 2014-07-16 NOTE — Progress Notes (Signed)
[place]  Telephone:([phone] Fax:(336) [phone]  ID: Paige Hardin OB: 05-Aug-1946 MR#: [id number]#:[id number] Patient Care Team: Stephens Shire, MD as PCP - General (Family Medicine)  DIAGNOSIS:  Anemia secondary to iron deficiency  Anemia secondary to renal insufficiency  Chronic anticoagulation for paroxysmal atrial fibrillation  INTERVAL HISTORY: Paige Hardin is here today for her follow-up. She is doing much better since the last time she was here. She states that she has much more energy and that her SOB is improved. She is getting physical therapy and she feels that this is also making a difference. She last received iron in June. She did well with this. At that time, her ferritin was only 79 with an iron saturation of 14%. Her erythropoietin level was only 29. She has a lot of health issues and is on multiple medications.  She states that the swelling her legs is much better. She denies tenderness, numbness or tingling in her extremities. She has had no bleeding or pain. She denies fever, chills, n/v, cough, rash, headaches, dizziness, chest pain, palpitations, abdominal pain, constipation, diarrhea, blood in urine or stool. She is still on supplemental oxygen.   CURRENT TREATMENT: IV iron as indicated  Aranesp 300 mcg subcutaneous as needed for hemoglobin less than 11  ELIQUIS-long-term  REVIEW OF SYSTEMS: All other 10 point review of systems is negative.   PAST MEDICAL HISTORY: Past Medical History  Diagnosis Date  . Chronic low back pain   . Emphysema   . Paroxysmal atrial fibrillation     a. failed flecainide;  b. 03/2014 amio started->DCCV;  c. Chronic pradaxa.  Marland Kitchen COPD (chronic obstructive pulmonary disease)   . Hyperlipidemia   . Diastolic congestive heart failure   . Obese   . Carotid atherosclerosis   . Lung nodules   . Hoarseness, chronic   . Pneumonia 1990's    "once"  . Type 2 diabetes mellitus   . GERD (gastroesophageal reflux disease)   .  Arthritis     "joints" (01/14/2014)  . Depression     "maybe" (01/14/2014   PAST SURGICAL HISTORY: Past Surgical History  Procedure Laterality Date  . Esophagogastroduodenoscopy  01/26/2012    Procedure: ESOPHAGOGASTRODUODENOSCOPY (EGD);  Surgeon: Beryle Beams, MD;  Location: [facility];  Service: Endoscopy;  Laterality: N/A;  . Colonoscopy  01/26/2012    Procedure: COLONOSCOPY;  Surgeon: Beryle Beams, MD;  Location: [facility];  Service: Endoscopy;  Laterality: N/A;  . Breast biopsy Left ~ 1980    "solid"  . Breast lumpectomy Left ~ 1980    "removed a mass; it was benign" (01/14/2014)  . Total abdominal hysterectomy  1994  . Cardioversion N/A 03/11/2014    Procedure: CARDIOVERSION;  Surgeon: Sanda Klein, MD;  Location: [facility];  Service: Cardiovascular;  Laterality: N/A;  . Cardioversion N/A 03/27/2014    Procedure: CARDIOVERSION;  Surgeon: Darlin Coco, MD;  [facility];  Service: Cardiovascular;  Laterality: N/A;   FAMILY HISTORY Family History  Problem Relation Age of Onset  . Breast cancer Sister   . Heart disease    . Hodgkin's lymphoma    . Asthma Mother    GYNECOLOGIC HISTORY:  No LMP recorded. Patient has had a hysterectomy.   SOCIAL HISTORY:  History   Social History  . Marital Status: Legally Separated    Spouse Name: N/A    Number of Children: N/A  . Years of Education: N/A   Occupational History  . retired from  postal service    Social History Main Topics  . Smoking status: Former Smoker -- 3.00 packs/day for 45 years    Types: Cigarettes    Start date: 11/08/1963    Quit date: 12/28/2008  . Smokeless tobacco: Never Used     Comment: quit smoking 5 years ago  . Alcohol Use: Yes     Comment: 01/14/2014 "used to drink socially in the past; last drink was probably 10 yr ago"  . Drug Use: No  . Sexual Activity: Not Currently   Other Topics Concern  . Not on file   Social History Narrative  . No narrative on file   ADVANCED DIRECTIVES: <no  information>  HEALTH MAINTENANCE: History  Substance Use Topics  . Smoking status: Former Smoker -- 3.00 packs/day for 45 years    Types: Cigarettes    Start date: 11/08/1963    Quit date: 12/28/2008  . Smokeless tobacco: Never Used     Comment: quit smoking 5 years ago  . Alcohol Use: Yes     Comment: 01/14/2014 "used to drink socially in the past; last drink was probably 10 yr ago"   Colonoscopy: PAP: Bone density: Lipid panel:  Allergies  Allergen Reactions  . Ace Inhibitors Cough  . Clindamycin Rash    Current Outpatient Prescriptions  Medication Sig Dispense Refill  . acetaminophen (TYLENOL) 325 MG tablet Take 325 mg by mouth daily as needed for mild pain.       Marland Kitchen amiodarone (PACERONE) 200 MG tablet Take 1 tablet (200 mg total) by mouth daily.      Marland Kitchen apixaban (ELIQUIS) 5 MG TABS tablet Take 1 tablet (5 mg total) by mouth 2 (two) times daily.  60 tablet    . Ascorbic Acid (VITAMIN C) 500 MG CAPS Take 500 mg by mouth daily.       . budesonide-formoterol (SYMBICORT) 160-4.5 MCG/ACT inhaler Inhale 2 puffs into the lungs 2 (two) times daily.      . Ca & Phos-Vit D-Mag (CALCIUM) [phone] TABS Take 1 tablet by mouth daily.       . calcium-vitamin D (OSCAL WITH D) 500-200 MG-UNIT per tablet Take 1 tablet by mouth daily with breakfast.      . Cholecalciferol (VITAMIN D) 2000 UNITS tablet Take 2,000 Units by mouth daily.      Marland Kitchen diltiazem (CARDIZEM CD) 180 MG 24 hr capsule Take 1 capsule (180 mg total) by mouth daily.  30 capsule  3  . ezetimibe-simvastatin (VYTORIN) 10-40 MG per tablet Take 1 tablet by mouth at bedtime.       . furosemide (LASIX) 80 MG tablet Take 2 tablets (160 mg total) by mouth 2 (two) times daily. MAKE SURE TO TAKE AT 8 AM AND 2 PM      . glyBURIDE micronized (GLYNASE) 3 MG tablet Take 3 mg by mouth daily.       . insulin aspart (NOVOLOG) 100 UNIT/ML injection Inject 3 Units into the skin 2 (two) times daily at 10 am and 4 pm.      . levalbuterol (XOPENEX  HFA) 45 MCG/ACT inhaler Inhale 2 puffs into the lungs every 6 (six) hours as needed for wheezing.  1 Inhaler  12  . levalbuterol (XOPENEX) 0.63 MG/3ML nebulizer solution Take 3 mLs (0.63 mg total) by nebulization every 6 (six) hours as needed for wheezing or shortness of breath.  3 mL  12  . LORazepam (ATIVAN) 0.5 MG tablet Take 0.5 mg by mouth every 4 (four) hours  as needed for anxiety.      . Magnesium-Zinc 133.33-5 MG TABS Take 133 mg by mouth daily.      . metolazone (ZAROXOLYN) 2.5 MG tablet Take 2.5 mg by mouth daily as needed. Weight gain 3 lbs in 1 day or 5 lbs in 1 wk      . metoprolol (LOPRESSOR) 50 MG tablet Take 1 tablet (50 mg total) by mouth 2 (two) times daily.  60 tablet  3  . Multiple Vitamins-Iron (MULTIVITAMIN/IRON PO) Take 1 tablet by mouth daily.      Marland Kitchen omeprazole (PRILOSEC) 20 MG capsule Take 20 mg by mouth daily.      . potassium chloride SA (K-DUR,KLOR-CON) 20 MEQ tablet Take 20 mEq by mouth 3 (three) times daily. MAKE SURE TO TAKE EXTRA 20 MEQ ON METOLAZONE DAYS      . PROAIR HFA 108 (90 BASE) MCG/ACT inhaler 1 puff every 4 (four) hours as needed.      . sitaGLIPtin (JANUVIA) 100 MG tablet Take 100 mg by mouth daily.      . sodium chloride (OCEAN) 0.65 % SOLN nasal spray Place 1 spray into both nostrils as needed for congestion.      Marland Kitchen tiotropium (SPIRIVA) 18 MCG inhalation capsule Place 18 mcg into inhaler and inhale daily.      . vitamin B-12 (CYANOCOBALAMIN) 500 MCG tablet Take 500 mcg by mouth daily.      . bisacodyl (DULCOLAX) 10 MG suppository Place 10 mg rectally as needed for moderate constipation.      Marland Kitchen nystatin cream (MYCOSTATIN) Apply 1 application topically 2 (two) times daily. Abdominal folds twice daily for rash       No current facility-administered medications for this visit.   OBJECTIVE: Filed Vitals:   07/16/14 1217  BP: 141/48  Pulse: 55  Temp: 97.5 F (36.4 C)  Resp: 16   Body mass index is 44.26 kg/(m^2). ECOG FS:0 - Asymptomatic Ocular:  Sclerae unicteric, pupils equal, round and reactive to light Ear-nose-throat: Oropharynx clear, dentition fair Lymphatic: No cervical or supraclavicular adenopathy Lungs no rales or rhonchi, good excursion bilaterally Heart regular rate and rhythm, no murmur appreciated Abd soft, nontender, positive bowel sounds MSK no focal spinal tenderness, no joint edema Neuro: non-focal, well-oriented, appropriate affect Breasts: Deferred  LAB RESULTS: CMP     Component Value Date/Time   NA 138 06/18/2014 1200   K 4.1 06/18/2014 1200   CL 98 06/18/2014 1200   CO2 30 06/18/2014 1200   GLUCOSE 131* 06/18/2014 1200   BUN 24* 06/18/2014 1200   CREATININE 1.55* 06/18/2014 1200   CALCIUM 9.4 06/18/2014 1200   PROT 6.6 06/18/2014 1200   ALBUMIN 3.9 06/18/2014 1200   AST 19 06/18/2014 1200   ALT 19 06/18/2014 1200   ALKPHOS 69 06/18/2014 1200   BILITOT 0.4 06/18/2014 1200   GFRNONAA 31* 06/04/2014 1214   GFRAA 36* 06/04/2014 1214   No results found for this basename: SPEP, UPEP,  kappa and lambda light chains   Lab Results  Component Value Date   WBC 10.0 07/16/2014   NEUTROABS 8.3* 07/16/2014   HGB 11.8 07/16/2014   HCT 36.5 07/16/2014   MCV 92 07/16/2014   PLT 252 07/16/2014   No results found for this basename: LABCA2   No components found with this basename: [id number]   No results found for this basename: INR,  in the last 168 hours  STUDIES: No results found.  ASSESSMENT/PLAN: Paige Hardin is 68 year old [place]  female with iron deficiency anemia secondary to renal failure. She is multiple medical problems. She is feeling much better and did very well with the iron she received in June. Her quality of life is better.  We will see what her iron studies are. I don't think she will need iron this time.  She does not need Aranesp today.  We will see her back in 2 months for labs and follow-up.    Eliezer Bottom, NP 07/16/2014 1:28 PM

## 2014-07-17 LAB — FERRITIN CHCC: Ferritin: 308 ng/ml — ABNORMAL HIGH (ref 9–269)

## 2014-07-17 LAB — IRON AND TIBC CHCC
%SAT: 30 % (ref 21–57)
Iron: 72 ug/dL (ref 41–142)
TIBC: 243 ug/dL (ref 236–444)
UIBC: 171 ug/dL (ref 120–384)

## 2014-07-29 DIAGNOSIS — R262 Difficulty in walking, not elsewhere classified: Secondary | ICD-10-CM | POA: Diagnosis not present

## 2014-07-29 DIAGNOSIS — I509 Heart failure, unspecified: Secondary | ICD-10-CM | POA: Diagnosis not present

## 2014-07-29 DIAGNOSIS — J449 Chronic obstructive pulmonary disease, unspecified: Secondary | ICD-10-CM | POA: Diagnosis not present

## 2014-07-29 DIAGNOSIS — M6281 Muscle weakness (generalized): Secondary | ICD-10-CM | POA: Diagnosis not present

## 2014-07-29 DIAGNOSIS — R279 Unspecified lack of coordination: Secondary | ICD-10-CM | POA: Diagnosis not present

## 2014-08-27 DIAGNOSIS — Z23 Encounter for immunization: Secondary | ICD-10-CM | POA: Diagnosis not present

## 2014-08-31 DIAGNOSIS — M6281 Muscle weakness (generalized): Secondary | ICD-10-CM | POA: Diagnosis not present

## 2014-08-31 DIAGNOSIS — J449 Chronic obstructive pulmonary disease, unspecified: Secondary | ICD-10-CM | POA: Diagnosis not present

## 2014-08-31 DIAGNOSIS — R278 Other lack of coordination: Secondary | ICD-10-CM | POA: Diagnosis not present

## 2014-09-01 DIAGNOSIS — J449 Chronic obstructive pulmonary disease, unspecified: Secondary | ICD-10-CM | POA: Diagnosis not present

## 2014-09-01 DIAGNOSIS — R278 Other lack of coordination: Secondary | ICD-10-CM | POA: Diagnosis not present

## 2014-09-01 DIAGNOSIS — M6281 Muscle weakness (generalized): Secondary | ICD-10-CM | POA: Diagnosis not present

## 2014-09-03 ENCOUNTER — Ambulatory Visit (HOSPITAL_COMMUNITY)
Admission: RE | Admit: 2014-09-03 | Discharge: 2014-09-03 | Disposition: A | Discharge status: Home / Self Care | Payer: Medicare Other | Source: Ambulatory Visit | Attending: Internal Medicine | Admitting: Internal Medicine

## 2014-09-03 ENCOUNTER — Encounter (HOSPITAL_COMMUNITY): Payer: Self-pay

## 2014-09-03 VITALS — BP 133/79 | HR 61 | Resp 20 | Wt 264.1 lb

## 2014-09-03 DIAGNOSIS — Z87891 Personal history of nicotine dependence: Secondary | ICD-10-CM | POA: Diagnosis not present

## 2014-09-03 DIAGNOSIS — Z9981 Dependence on supplemental oxygen: Secondary | ICD-10-CM | POA: Diagnosis not present

## 2014-09-03 DIAGNOSIS — I5032 Chronic diastolic (congestive) heart failure: Secondary | ICD-10-CM | POA: Diagnosis not present

## 2014-09-03 DIAGNOSIS — I4891 Unspecified atrial fibrillation: Secondary | ICD-10-CM

## 2014-09-03 DIAGNOSIS — M6281 Muscle weakness (generalized): Secondary | ICD-10-CM | POA: Diagnosis not present

## 2014-09-03 DIAGNOSIS — Z79899 Other long term (current) drug therapy: Secondary | ICD-10-CM | POA: Insufficient documentation

## 2014-09-03 DIAGNOSIS — E669 Obesity, unspecified: Secondary | ICD-10-CM | POA: Insufficient documentation

## 2014-09-03 DIAGNOSIS — N183 Chronic kidney disease, stage 3 unspecified: Secondary | ICD-10-CM

## 2014-09-03 DIAGNOSIS — D509 Iron deficiency anemia, unspecified: Secondary | ICD-10-CM | POA: Insufficient documentation

## 2014-09-03 DIAGNOSIS — I130 Hypertensive heart and chronic kidney disease with heart failure and stage 1 through stage 4 chronic kidney disease, or unspecified chronic kidney disease: Secondary | ICD-10-CM | POA: Insufficient documentation

## 2014-09-03 DIAGNOSIS — E785 Hyperlipidemia, unspecified: Secondary | ICD-10-CM | POA: Insufficient documentation

## 2014-09-03 DIAGNOSIS — J449 Chronic obstructive pulmonary disease, unspecified: Secondary | ICD-10-CM | POA: Diagnosis not present

## 2014-09-03 DIAGNOSIS — Z794 Long term (current) use of insulin: Secondary | ICD-10-CM | POA: Insufficient documentation

## 2014-09-03 DIAGNOSIS — E118 Type 2 diabetes mellitus with unspecified complications: Secondary | ICD-10-CM | POA: Diagnosis not present

## 2014-09-03 DIAGNOSIS — I480 Paroxysmal atrial fibrillation: Secondary | ICD-10-CM | POA: Insufficient documentation

## 2014-09-03 DIAGNOSIS — R278 Other lack of coordination: Secondary | ICD-10-CM | POA: Diagnosis not present

## 2014-09-03 LAB — BASIC METABOLIC PANEL
Anion gap: 12 (ref 5–15)
BUN: 29 mg/dL — AB (ref 6–23)
CO2: 28 meq/L (ref 19–32)
CREATININE: 1.42 mg/dL — AB (ref 0.50–1.10)
Calcium: 9.7 mg/dL (ref 8.4–10.5)
Chloride: 98 mEq/L (ref 96–112)
GFR calc Af Amer: 43 mL/min — ABNORMAL LOW (ref >90)
GFR, EST NON AFRICAN AMERICAN: 37 mL/min — AB (ref >90)
GLUCOSE: 98 mg/dL (ref 70–99)
Potassium: 4.1 mEq/L (ref 3.7–5.3)
Sodium: 138 mEq/L (ref 137–147)

## 2014-09-03 LAB — PRO B NATRIURETIC PEPTIDE: PRO B NATRI PEPTIDE: 590.6 pg/mL — AB (ref 0–125)

## 2014-09-03 NOTE — Progress Notes (Signed)
Patient ID: Paige Hardin, female   DOB: 04/12/1946, 68 y.o.   MRN: [id number] PCP: Dr. Tollie Pizza Pulmonlogist: Dr. Gwenette Greet   68 yo with history of COPD on home oxygen, paroxysmal atrial fibrillation, and chronic diastolic. She was admitted for several weeks from 3/15 to 4/15 with atrial fibrillation/RVR, acute on chronic diastolic CHF, and COPD exacerbation.  She ended up being cardioverted in the hospital.  She was diuresed with IV Lasix.  After the prolonged hospitalization, she was sent to [facility].  She was again admitted at the end of 4/15 and hospitalized into 5/15 with acute on chronic diastolic CHF.  She was intubated this time and went back into atrial fibrillation with RVR.  She was cardioverted back to NSR and is maintained on amiodarone.  She was again diuresed and discharged to SNF [facility]).    Follow up for Heart Failure: Doing well. Weight at [facility] 261-263 lbs. Breathing is ok. Wears 2L continuous O2. Denies SOB, PND or CP. Sleeps in a recliner d/t back pain. Able to walk about 1/3 mile before having to stop. She is getting stronger and usually stops d/t weakness not SOB. Following a low salt diet and trying to drink less than 2L a day.   Labs (4/15): K 4.4, creatinine 1.4 Labs (5/15): creatinine 1.3, hemoglobin 8.5 Labs (04/30/14 ): K 3.6, creatinine 1.6          (05/05/14) : K 4.4, creatinine 1.42, TSH 3.69, pro-BNP 999, AST 15, ALT 14         (06/18/14): K 4.1, creatinine 1.55, AST 19, ALT 19  1. Chronic low back pain.  2. COPD: On home oxygen. Prior smoker.  3. Type 2 diabetes.  4. Paroxysmal atrial fibrillation:  DCCV in 2010 and again in 4/15.  She is on warfarin and is on amiodarone to maintain NSR.  5. Hyperlipidemia.  6. Diastolic congestive heart failure: Echo (2/15) with EF 60%, mild MR, PA systolic pressure 53 mmHg. 7. Hypertension.  8. Low back pain 9. Obesity 10. CKD 11. Anemia: Fe deficiency 12. RLL nodule  13. ACEI cough  SOCIAL HISTORY: Prior  smoker.  Patient is currently in SNF [facility]).  FAMILY HISTORY: Non-Hodgkin lymphoma and breast cancer. No early-onset  coronary artery disease.   REVIEW OF SYSTEMS: Negative except as noted in the history of present  Illness.  Current Outpatient Prescriptions  Medication Sig Dispense Refill  . acetaminophen (TYLENOL) 325 MG tablet Take 325 mg by mouth daily as needed for mild pain.       Marland Kitchen amiodarone (PACERONE) 200 MG tablet Take 1 tablet (200 mg total) by mouth daily.      Marland Kitchen apixaban (ELIQUIS) 5 MG TABS tablet Take 1 tablet (5 mg total) by mouth 2 (two) times daily.  60 tablet    . Ascorbic Acid (VITAMIN C) 500 MG CAPS Take 500 mg by mouth daily.       . bisacodyl (DULCOLAX) 10 MG suppository Place 10 mg rectally as needed for moderate constipation.      . budesonide-formoterol (SYMBICORT) 160-4.5 MCG/ACT inhaler Inhale 2 puffs into the lungs 2 (two) times daily.      . Ca & Phos-Vit D-Mag (CALCIUM) [phone] TABS Take 1 tablet by mouth daily.       . calcium-vitamin D (OSCAL WITH D) 500-200 MG-UNIT per tablet Take 1 tablet by mouth daily with breakfast.      . Cholecalciferol (VITAMIN D) 2000 UNITS tablet Take 2,000 Units by mouth daily.      Marland Kitchen  diltiazem (CARDIZEM CD) 180 MG 24 hr capsule Take 1 capsule (180 mg total) by mouth daily.  30 capsule  3  . ezetimibe-simvastatin (VYTORIN) 10-40 MG per tablet Take 1 tablet by mouth at bedtime.       . ferrous sulfate 325 (65 FE) MG tablet Take 325 mg by mouth daily with breakfast.      . furosemide (LASIX) 80 MG tablet Take 2 tablets (160 mg total) by mouth 2 (two) times daily. MAKE SURE TO TAKE AT 8 AM AND 2 PM      . glyBURIDE micronized (GLYNASE) 3 MG tablet Take 3 mg by mouth daily.       . insulin aspart (NOVOLOG) 100 UNIT/ML injection Inject 3 Units into the skin 2 (two) times daily at 10 am and 4 pm.      . levalbuterol (XOPENEX HFA) 45 MCG/ACT inhaler Inhale 2 puffs into the lungs every 6 (six) hours as needed for wheezing.  1  Inhaler  12  . levalbuterol (XOPENEX) 0.63 MG/3ML nebulizer solution Take 3 mLs (0.63 mg total) by nebulization every 6 (six) hours as needed for wheezing or shortness of breath.  3 mL  12  . LORazepam (ATIVAN) 0.5 MG tablet Take 0.5 mg by mouth every 4 (four) hours as needed for anxiety.      . Magnesium-Zinc 133.33-5 MG TABS Take 133 mg by mouth daily.      . metolazone (ZAROXOLYN) 2.5 MG tablet Take 2.5 mg by mouth daily as needed. Weight gain 3 lbs in 1 day or 5 lbs in 1 wk      . metoprolol (LOPRESSOR) 50 MG tablet Take 1 tablet (50 mg total) by mouth 2 (two) times daily.  60 tablet  3  . Multiple Vitamins-Iron (MULTIVITAMIN/IRON PO) Take 1 tablet by mouth daily.      Marland Kitchen nystatin cream (MYCOSTATIN) Apply 1 application topically 2 (two) times daily. Abdominal folds twice daily for rash      . omeprazole (PRILOSEC) 20 MG capsule Take 20 mg by mouth daily.      . potassium chloride SA (K-DUR,KLOR-CON) 20 MEQ tablet Take 20 mEq by mouth 3 (three) times daily. MAKE SURE TO TAKE EXTRA 20 MEQ ON METOLAZONE DAYS      . PROAIR HFA 108 (90 BASE) MCG/ACT inhaler 1 puff every 4 (four) hours as needed.      . sitaGLIPtin (JANUVIA) 100 MG tablet Take 100 mg by mouth daily.      . sodium chloride (OCEAN) 0.65 % SOLN nasal spray Place 1 spray into both nostrils as needed for congestion.      Marland Kitchen tiotropium (SPIRIVA) 18 MCG inhalation capsule Place 18 mcg into inhaler and inhale daily.      . vitamin B-12 (CYANOCOBALAMIN) 500 MCG tablet Take 500 mcg by mouth daily.       No current facility-administered medications for this encounter.    Filed Vitals:   09/03/14 1138  BP: 133/79  Pulse: 61  Resp: 20  Weight: 264 lb 2 oz (119.806 kg)  SpO2: 94%    68, Elderly appearing , NAD, on 2L, in wheelchair, friend present  Neck: Thick, difficult to assess d/t body habitus but appears ~ 8, no thyromegaly or thyroid nodule.  Lungs: Distant breath sounds bilaterally CV: Nondisplaced PMI.  Heart regular S1/S2,  no S3/S4, 2/6 early SEM RUSB. 1+ woody edema to knees bilaterally.  No carotid bruit.  Normal pedal pulses.  Abdomen: Soft, nontender, no hepatosplenomegaly, no distention.  Skin: Intact without lesions or rashes.  Neurologic: Alert and oriented x 3.  Psych: Normal affect. Extremities: No clubbing or cyanosis.   Assessment/Plan:  1. Atrial fibrillation: Paroxysmal. Appears to be in NSR today. Will continue amiodarone 200 mg daily and Eliquis 5 mg twice a day.  Last TSH and LFTs normal (04/2014)  Will need yearly eye exam.  She was evaluated by EP in hospital most recent admission, recommended against AV nodal ablation/pacing.  2. Chronic diastolic CHF: EF 09% (06/1190)  Volume status stable.  Continue lasix 160 mg BID with metolazone 2.5 mg PRN for weight gain more than 3 lbs in a day or 5 lbs in a week. - SBP stable. - Reinforced the need and importance of daily weights, a low sodium diet, and fluid restriction (less than 2 L a day). Instructed to call the HF clinic if weight increases more than 3 lbs overnight or 5 lbs in a week.  4. CKD stage III- baseline Cr 1.3-1.5. Will continue to follow. Check BMET today.  5. Anemia: Per Dr Marin Olp.  6. Deconditioning: Continue to work with PT. She is getting stronger and congratulated her on continued success.  Follow up in 4 months.  Junie Bame B NP-C  09/03/2014

## 2014-09-03 NOTE — Patient Instructions (Signed)
Doing fantastic.   Call any issues.  Have a wonderful Christmas and New Year.  F/U 4 months  Do the following things EVERYDAY: 1) Weigh yourself in the morning before breakfast. Write it down and keep it in a log. 2) Take your medicines as prescribed 3) Eat low salt foods-Limit salt (sodium) to 2000 mg per day.  4) Stay as active as you can everyday 5) Limit all fluids for the day to less than 2 liters 6)

## 2014-09-07 DIAGNOSIS — M6281 Muscle weakness (generalized): Secondary | ICD-10-CM | POA: Diagnosis not present

## 2014-09-07 DIAGNOSIS — R278 Other lack of coordination: Secondary | ICD-10-CM | POA: Diagnosis not present

## 2014-09-07 DIAGNOSIS — J449 Chronic obstructive pulmonary disease, unspecified: Secondary | ICD-10-CM | POA: Diagnosis not present

## 2014-09-08 DIAGNOSIS — J449 Chronic obstructive pulmonary disease, unspecified: Secondary | ICD-10-CM | POA: Diagnosis not present

## 2014-09-08 DIAGNOSIS — R278 Other lack of coordination: Secondary | ICD-10-CM | POA: Diagnosis not present

## 2014-09-08 DIAGNOSIS — M6281 Muscle weakness (generalized): Secondary | ICD-10-CM | POA: Diagnosis not present

## 2014-09-10 DIAGNOSIS — M6281 Muscle weakness (generalized): Secondary | ICD-10-CM | POA: Diagnosis not present

## 2014-09-10 DIAGNOSIS — R278 Other lack of coordination: Secondary | ICD-10-CM | POA: Diagnosis not present

## 2014-09-10 DIAGNOSIS — J449 Chronic obstructive pulmonary disease, unspecified: Secondary | ICD-10-CM | POA: Diagnosis not present

## 2014-09-14 DIAGNOSIS — J449 Chronic obstructive pulmonary disease, unspecified: Secondary | ICD-10-CM | POA: Diagnosis not present

## 2014-09-14 DIAGNOSIS — R278 Other lack of coordination: Secondary | ICD-10-CM | POA: Diagnosis not present

## 2014-09-14 DIAGNOSIS — M6281 Muscle weakness (generalized): Secondary | ICD-10-CM | POA: Diagnosis not present

## 2014-09-16 DIAGNOSIS — R278 Other lack of coordination: Secondary | ICD-10-CM | POA: Diagnosis not present

## 2014-09-16 DIAGNOSIS — J449 Chronic obstructive pulmonary disease, unspecified: Secondary | ICD-10-CM | POA: Diagnosis not present

## 2014-09-16 DIAGNOSIS — M6281 Muscle weakness (generalized): Secondary | ICD-10-CM | POA: Diagnosis not present

## 2014-09-22 ENCOUNTER — Encounter: Payer: Self-pay | Admitting: Hematology & Oncology

## 2014-09-22 ENCOUNTER — Ambulatory Visit (HOSPITAL_BASED_OUTPATIENT_CLINIC_OR_DEPARTMENT_OTHER): Payer: Medicare Other | Admitting: Hematology & Oncology

## 2014-09-22 ENCOUNTER — Other Ambulatory Visit (HOSPITAL_BASED_OUTPATIENT_CLINIC_OR_DEPARTMENT_OTHER): Payer: Medicare Other | Admitting: Lab

## 2014-09-22 VITALS — BP 131/56 | HR 59 | Temp 97.6°F | Resp 16 | Ht 66.0 in | Wt 270.0 lb

## 2014-09-22 DIAGNOSIS — D509 Iron deficiency anemia, unspecified: Secondary | ICD-10-CM

## 2014-09-22 DIAGNOSIS — D649 Anemia, unspecified: Secondary | ICD-10-CM

## 2014-09-22 DIAGNOSIS — N289 Disorder of kidney and ureter, unspecified: Secondary | ICD-10-CM | POA: Diagnosis not present

## 2014-09-22 LAB — CBC WITH DIFFERENTIAL (CANCER CENTER ONLY)
BASO#: 0 10e3/uL (ref 0.0–0.2)
BASO%: 0.2 % (ref 0.0–2.0)
EOS%: 1.9 % (ref 0.0–7.0)
Eosinophils Absolute: 0.2 10e3/uL (ref 0.0–0.5)
HCT: 35.8 % (ref 34.8–46.6)
HGB: 11.8 g/dL (ref 11.6–15.9)
LYMPH#: 0.9 10e3/uL (ref 0.9–3.3)
LYMPH%: 8.8 % — ABNORMAL LOW (ref 14.0–48.0)
MCH: 31.9 pg (ref 26.0–34.0)
MCHC: 33 g/dL (ref 32.0–36.0)
MCV: 97 fL (ref 81–101)
MONO#: 0.7 10e3/uL (ref 0.1–0.9)
MONO%: 6.3 % (ref 0.0–13.0)
NEUT#: 8.8 10e3/uL — ABNORMAL HIGH (ref 1.5–6.5)
NEUT%: 82.8 % — ABNORMAL HIGH (ref 39.6–80.0)
Platelets: 238 10e3/uL (ref 145–400)
RBC: 3.7 10e6/uL (ref 3.70–5.32)
RDW: 15.1 % (ref 11.1–15.7)
WBC: 10.6 10e3/uL — ABNORMAL HIGH (ref 3.9–10.0)

## 2014-09-22 LAB — IRON AND TIBC CHCC
%SAT: 28 % (ref 21–57)
IRON: 65 ug/dL (ref 41–142)
TIBC: 236 ug/dL (ref 236–444)
UIBC: 171 ug/dL (ref 120–384)

## 2014-09-22 LAB — FERRITIN CHCC: Ferritin: 259 ng/mL (ref 9–269)

## 2014-09-22 LAB — RETICULOCYTES (CHCC)
ABS Retic: 63.9 10*3/uL (ref 19.0–186.0)
RBC.: 3.76 MIL/uL — AB (ref 3.87–5.11)
RETIC CT PCT: 1.7 % (ref 0.4–2.3)

## 2014-09-23 NOTE — Progress Notes (Signed)
Hematology and Oncology Follow Up Visit  GIUSEPPINA QUINONES [id number] 1946/09/20 68 y.o. 09/23/2014   Principle Diagnosis:   Anemia secondary to iron deficiency  Anemia secondary to renal insufficiency  Chronic anticoagulation for paroxysmal atrial fibrillation  Current Therapy:    IV iron as indicated  Aranesp 300 mcg subcutaneous as needed for hemoglobin less than 11  ELIQUIS-long-term     Interim History:  Ms.  Koble is time for follow-up. We last saw her back in August. She got iron back in June. This is helping her quite a bit. She feels better. Her breathing is better. She still wears oxygen pretty much all the time. However, she is able to do more things. She enjoys being able to go out and feel like a more normal person.   Her erythropoietin level was only 29.  There's been no obvious bleeding. She's had no change in bowel or bladder habits. Her appetite has been pretty good.  She's had no leg swelling. She's had no rashes.  She continues on ELIQUIS.   Medications: Current outpatient prescriptions:acetaminophen (TYLENOL) 325 MG tablet, Take 325 mg by mouth daily as needed for mild pain. , Disp: , Rfl: ;  amiodarone (PACERONE) 200 MG tablet, Take 1 tablet (200 mg total) by mouth daily., Disp: , Rfl: ;  apixaban (ELIQUIS) 5 MG TABS tablet, Take 1 tablet (5 mg total) by mouth 2 (two) times daily., Disp: 60 tablet, Rfl: ;  Ascorbic Acid (VITAMIN C) 500 MG CAPS, Take 500 mg by mouth daily. , Disp: , Rfl:  bisacodyl (DULCOLAX) 10 MG suppository, Place 10 mg rectally as needed for moderate constipation., Disp: , Rfl: ;  budesonide-formoterol (SYMBICORT) 160-4.5 MCG/ACT inhaler, Inhale 2 puffs into the lungs 2 (two) times daily., Disp: , Rfl: ;  Ca & Phos-Vit D-Mag (CALCIUM) [phone] TABS, Take 1 tablet by mouth daily. , Disp: , Rfl:  calcium-vitamin D (OSCAL WITH D) 500-200 MG-UNIT per tablet, Take 1 tablet by mouth daily with breakfast., Disp: , Rfl: ;  Cholecalciferol (VITAMIN  D) 2000 UNITS tablet, Take 2,000 Units by mouth daily., Disp: , Rfl: ;  diltiazem (CARDIZEM CD) 180 MG 24 hr capsule, Take 1 capsule (180 mg total) by mouth daily., Disp: 30 capsule, Rfl: 3;  ezetimibe-simvastatin (VYTORIN) 10-40 MG per tablet, Take 1 tablet by mouth at bedtime. , Disp: , Rfl:  ferrous sulfate 325 (65 FE) MG tablet, Take 325 mg by mouth daily with breakfast., Disp: , Rfl: ;  furosemide (LASIX) 80 MG tablet, Take 2 tablets (160 mg total) by mouth 2 (two) times daily. MAKE SURE TO TAKE AT 8 AM AND 2 PM, Disp: , Rfl: ;  glyBURIDE micronized (GLYNASE) 3 MG tablet, Take 3 mg by mouth daily. , Disp: , Rfl:  insulin aspart (NOVOLOG) 100 UNIT/ML injection, Inject 3 Units into the skin 2 (two) times daily at 10 am and 4 pm., Disp: , Rfl: ;  levalbuterol (XOPENEX HFA) 45 MCG/ACT inhaler, Inhale 2 puffs into the lungs every 6 (six) hours as needed for wheezing., Disp: 1 Inhaler, Rfl: 12 levalbuterol (XOPENEX) 0.63 MG/3ML nebulizer solution, Take 3 mLs (0.63 mg total) by nebulization every 6 (six) hours as needed for wheezing or shortness of breath., Disp: 3 mL, Rfl: 12;  Magnesium-Zinc 133.33-5 MG TABS, Take 133 mg by mouth daily., Disp: , Rfl: ;  metolazone (ZAROXOLYN) 2.5 MG tablet, Take 2.5 mg by mouth daily as needed. Weight gain 3 lbs in 1 day or 5 lbs in 1 wk, Disp: ,  Rfl:  metoprolol (LOPRESSOR) 50 MG tablet, Take 1 tablet (50 mg total) by mouth 2 (two) times daily., Disp: 60 tablet, Rfl: 3;  Multiple Vitamins-Iron (MULTIVITAMIN/IRON PO), Take 1 tablet by mouth daily., Disp: , Rfl: ;  nystatin cream (MYCOSTATIN), Apply 1 application topically 2 (two) times daily. Abdominal folds twice daily for rash, Disp: , Rfl: ;  omeprazole (PRILOSEC) 20 MG capsule, Take 20 mg by mouth daily., Disp: , Rfl:  potassium chloride SA (K-DUR,KLOR-CON) 20 MEQ tablet, Take 20 mEq by mouth 3 (three) times daily. MAKE SURE TO TAKE EXTRA 20 MEQ ON METOLAZONE DAYS, Disp: , Rfl: ;  PROAIR HFA 108 (90 BASE) MCG/ACT inhaler, 1  puff every 4 (four) hours as needed., Disp: , Rfl: ;  sitaGLIPtin (JANUVIA) 100 MG tablet, Take 100 mg by mouth daily., Disp: , Rfl:  sodium chloride (OCEAN) 0.65 % SOLN nasal spray, Place 1 spray into both nostrils as needed for congestion., Disp: , Rfl: ;  tiotropium (SPIRIVA) 18 MCG inhalation capsule, Place 18 mcg into inhaler and inhale daily., Disp: , Rfl: ;  vitamin B-12 (CYANOCOBALAMIN) 500 MCG tablet, Take 500 mcg by mouth daily., Disp: , Rfl:   Allergies:  Allergies  Allergen Reactions  . Ace Inhibitors Cough  . Clindamycin Rash    Past Medical History, Surgical history, Social history, and Family History were reviewed and updated.  Review of Systems: As above  Physical Exam:  height is 5\' 6"  (1.676 m) and weight is 270 lb (122.471 kg). Her oral temperature is 97.6 F (36.4 C). Her blood pressure is 131/56 and her pulse is 59. Her respiration is 16 and oxygen saturation is 97%.   Obese [place] female in no obvious distress. Head and neck exam shows no ocular or oral lesion. She has no palpable cervical or supraclavicular lymph nodes. Lungs show scattered crackles bilaterally. She decent air. Cardiac exam regular rate and rhythm. I did not detect atrial fibrillation. She is a 1/6 systolic murmur. Abdomen is soft. Has good bowel sounds. There is no fluid wave. There is no palpable liver or spleen tip. Extremities shows some chronic stasis dermatitis type changes in her lower extremities. She has mild nonpitting edema in her lower legs. She is osteo- arthritic changes in her joints. Muscle strength is 5/5 bilaterally. Skin exam shows no rashes, ecchymoses or petechia. Neurological exam is non-focal.  Lab Results  Component Value Date   WBC 10.6* 09/22/2014   HGB 11.8 09/22/2014   HCT 35.8 09/22/2014   MCV 97 09/22/2014   PLT 238 09/22/2014     Chemistry      Component Value Date/Time   NA 138 09/03/2014 1152   K 4.1 09/03/2014 1152   CL 98 09/03/2014 1152   CO2 28 09/03/2014 1152    BUN 29* 09/03/2014 1152   CREATININE 1.42* 09/03/2014 1152      Component Value Date/Time   CALCIUM 9.7 09/03/2014 1152   ALKPHOS 69 06/18/2014 1200   AST 19 06/18/2014 1200   ALT 19 06/18/2014 1200   BILITOT 0.4 06/18/2014 1200      ferritin is 259. Iron saturation is 28%. Total iron is 65.     Impression and Plan: Ms. Boyan is 68 year old [place] female. She has multiple medical problems.  her iron level is dropping a little bit. However, I don't think that we have to give her any iron right now. Possibly the next time that she is here, she might need some.    I  looked at her blood smear. The red cells looked better. I did not see as much anisocytosis. I do not see target cells.  We will plan to do back now in about 3 months .  I think we can move her appointments out a little bit more.   We spent a good 25-30 minutes with her today. We reviewed all of her lab work. She is very pleased with how much better she feels.   Volanda Napoleon, MD 10/28/20157:22 AM

## 2014-11-10 ENCOUNTER — Ambulatory Visit (INDEPENDENT_AMBULATORY_CARE_PROVIDER_SITE_OTHER): Payer: Medicare Other | Admitting: Pulmonary Disease

## 2014-11-10 ENCOUNTER — Encounter: Payer: Self-pay | Admitting: Pulmonary Disease

## 2014-11-10 VITALS — BP 122/74 | HR 64 | Temp 97.8°F | Ht 65.5 in | Wt 273.4 lb

## 2014-11-10 DIAGNOSIS — J438 Other emphysema: Secondary | ICD-10-CM | POA: Diagnosis not present

## 2014-11-10 NOTE — Progress Notes (Signed)
   Subjective:    Patient ID: Paige Hardin, female    DOB: [birth year], 68 y.o.   MRN: [id number]  HPI The patient comes in today for follow-up of her known COPD with chronic respiratory failure. She also has known chronic congestive heart failure, and is monitoring her fluid balance very carefully. She has been compliant on her bronchodilator regimen and oxygen, and feels that she is near her usual baseline. She has not had an acute exacerbation or pulmonary infection according to her history since the last visit. She has been trying to exercise more regularly, and is even walking around her building.   Review of Systems  Constitutional: Negative for fever and unexpected weight change.  HENT: Positive for congestion and postnasal drip. Negative for dental problem, ear pain, nosebleeds, rhinorrhea, sinus pressure, sneezing, sore throat and trouble swallowing.   Eyes: Negative for redness and itching.  Respiratory: Positive for cough and shortness of breath. Negative for chest tightness and wheezing.   Cardiovascular: Negative for palpitations and leg swelling.  Gastrointestinal: Negative for nausea and vomiting.  Genitourinary: Negative for dysuria.  Musculoskeletal: Negative for joint swelling.  Skin: Negative for rash.  Neurological: Negative for headaches.  Hematological: Does not bruise/bleed easily.  Psychiatric/Behavioral: Negative for dysphoric mood. The patient is not nervous/anxious.        Objective:   Physical Exam Obese female in no acute distress Nose without purulence or discharge noted Neck without lymphadenopathy or thyromegaly Chest with mildly decreased breath sounds, no crackles or wheezes Cardiac exam with regular rate and rhythm Lower extremities with 1+ edema, no cyanosis Alert and oriented, moves all 4 extremities.       Assessment & Plan:

## 2014-11-10 NOTE — Patient Instructions (Signed)
No change in medications Stay active, and watch your fluiid balance closely followup with me again in 30mos

## 2014-11-10 NOTE — Assessment & Plan Note (Signed)
The patient appears to be at a stable baseline from a COPD standpoint. She has not had an acute exacerbation or pulmonary infection since the last visit. I have asked her to stay on her bronchodilator regimen, as well as her oxygen. I have also encouraged her to work aggressively on weight loss and some type of conditioning program.

## 2014-12-22 ENCOUNTER — Other Ambulatory Visit (HOSPITAL_BASED_OUTPATIENT_CLINIC_OR_DEPARTMENT_OTHER): Payer: Medicare Other | Admitting: Lab

## 2014-12-22 ENCOUNTER — Ambulatory Visit (HOSPITAL_BASED_OUTPATIENT_CLINIC_OR_DEPARTMENT_OTHER): Payer: Medicare Other | Admitting: Family

## 2014-12-22 VITALS — BP 125/41 | HR 60 | Temp 97.5°F | Resp 16 | Ht 65.0 in | Wt 277.0 lb

## 2014-12-22 DIAGNOSIS — D509 Iron deficiency anemia, unspecified: Secondary | ICD-10-CM | POA: Diagnosis not present

## 2014-12-22 LAB — CBC WITH DIFFERENTIAL (CANCER CENTER ONLY)
BASO#: 0 10*3/uL (ref 0.0–0.2)
BASO%: 0.2 % (ref 0.0–2.0)
EOS%: 2 % (ref 0.0–7.0)
Eosinophils Absolute: 0.2 10*3/uL (ref 0.0–0.5)
HCT: 35.6 % (ref 34.8–46.6)
HGB: 11.4 g/dL — ABNORMAL LOW (ref 11.6–15.9)
LYMPH#: 1.1 10*3/uL (ref 0.9–3.3)
LYMPH%: 10.6 % — ABNORMAL LOW (ref 14.0–48.0)
MCH: 31.6 pg (ref 26.0–34.0)
MCHC: 32 g/dL (ref 32.0–36.0)
MCV: 99 fL (ref 81–101)
MONO#: 0.8 10*3/uL (ref 0.1–0.9)
MONO%: 8.3 % (ref 0.0–13.0)
NEUT%: 78.9 % (ref 39.6–80.0)
NEUTROS ABS: 7.8 10*3/uL — AB (ref 1.5–6.5)
Platelets: 232 10*3/uL (ref 145–400)
RBC: 3.61 10*6/uL — ABNORMAL LOW (ref 3.70–5.32)
RDW: 14.1 % (ref 11.1–15.7)
WBC: 9.9 10*3/uL (ref 3.9–10.0)

## 2014-12-22 LAB — CMP (CANCER CENTER ONLY)
ALT(SGPT): 18 U/L (ref 10–47)
AST: 19 U/L (ref 11–38)
Albumin: 3.2 g/dL — ABNORMAL LOW (ref 3.3–5.5)
Alkaline Phosphatase: 76 U/L (ref 26–84)
BILIRUBIN TOTAL: 0.6 mg/dL (ref 0.20–1.60)
BUN, Bld: 32 mg/dL — ABNORMAL HIGH (ref 7–22)
CALCIUM: 9.4 mg/dL (ref 8.0–10.3)
CO2: 31 meq/L (ref 18–33)
CREATININE: 1.5 mg/dL — AB (ref 0.6–1.2)
Chloride: 96 mEq/L — ABNORMAL LOW (ref 98–108)
GLUCOSE: 171 mg/dL — AB (ref 73–118)
Potassium: 4.2 mEq/L (ref 3.3–4.7)
Sodium: 139 mEq/L (ref 128–145)
TOTAL PROTEIN: 7 g/dL (ref 6.4–8.1)

## 2014-12-22 LAB — RETICULOCYTES (CHCC)
ABS Retic: 65.3 10*3/uL (ref 19.0–186.0)
RBC.: 3.63 MIL/uL — AB (ref 3.87–5.11)
Retic Ct Pct: 1.8 % (ref 0.4–2.3)

## 2014-12-22 LAB — IRON AND TIBC CHCC
%SAT: 28 % (ref 21–57)
Iron: 70 ug/dL (ref 41–142)
TIBC: 248 ug/dL (ref 236–444)
UIBC: 177 ug/dL (ref 120–384)

## 2014-12-22 LAB — FERRITIN CHCC: FERRITIN: 211 ng/mL (ref 9–269)

## 2014-12-22 NOTE — Progress Notes (Signed)
[place]  [phone] [phone]) [phone]  ID: Paige Hardin OB: 07-Apr-1946 MR#: [id number] ZMO#:[id number] Patient Care Team: Stephens Shire, MD as PCP - General (Family Medicine)  DIAGNOSIS:  Anemia secondary to iron deficiency  Anemia secondary to renal insufficiency  Chronic anticoagulation for paroxysmal atrial fibrillation  INTERVAL HISTORY: Paige Hardin is here today for her follow-up. She is doing well and feeling more energetic.  She last received iron in June and did well with it. In October her iron saturation was 28% and ferritin was 259.   She denies swelling, tenderness, numbness or tingling in her extremities.  She denies fever, chills, n/v, cough, rash, headaches, dizziness, chest pain, palpitations, abdominal pain, constipation, diarrhea, blood in urine or stool. She has had no bleeding or pain. She does have SOB with exertion but this has not worsened.  She is still on supplemental oxygen.  Her appetite is good and she is staying hydrated. Her weight is stable at 277 lbs.   CURRENT TREATMENT: IV iron as indicated  Aranesp 300 mcg subcutaneous as needed for hemoglobin less than 11  ELIQUIS-long-term  REVIEW OF SYSTEMS: All other 10 point review of systems is negative.   PAST MEDICAL HISTORY: Past Medical History  Diagnosis Date  . Chronic low back pain   . Emphysema   . Paroxysmal atrial fibrillation     a. failed flecainide;  b. 03/2014 amio started->DCCV;  c. Chronic pradaxa.  Marland Kitchen COPD (chronic obstructive pulmonary disease)   . Hyperlipidemia   . Diastolic congestive heart failure   . Obese   . Carotid atherosclerosis   . Lung nodules   . Hoarseness, chronic   . Pneumonia 1990's    "once"  . Type 2 diabetes mellitus   . GERD (gastroesophageal reflux disease)   . Arthritis     "joints" (01/14/2014)  . Depression     "maybe" (01/14/2014   PAST SURGICAL HISTORY: Past Surgical History  Procedure Laterality Date  .  Esophagogastroduodenoscopy  01/26/2012    Procedure: ESOPHAGOGASTRODUODENOSCOPY (EGD);  Surgeon: Beryle Beams, MD;  Location: [facility];  Service: Endoscopy;  Laterality: N/A;  . Colonoscopy  01/26/2012    Procedure: COLONOSCOPY;  Surgeon: Beryle Beams, MD;  Location: [facility];  Service: Endoscopy;  Laterality: N/A;  . Breast biopsy Left ~ 1980    "solid"  . Breast lumpectomy Left ~ 1980    "removed a mass; it was benign" (01/14/2014)  . Total abdominal hysterectomy  1994  . Cardioversion N/A 03/11/2014    Procedure: CARDIOVERSION;  Surgeon: Sanda Klein, MD;  Location: [facility];  Service: Cardiovascular;  Laterality: N/A;  . Cardioversion N/A 03/27/2014    Procedure: CARDIOVERSION;  Surgeon: Darlin Coco, MD;  Location: [facility] OR;  Service: Cardiovascular;  Laterality: N/A;   FAMILY HISTORY Family History  Problem Relation Age of Onset  . Breast cancer Sister   . Heart disease    . Hodgkin's lymphoma    . Asthma Mother    GYNECOLOGIC HISTORY:  No LMP recorded. Patient has had a hysterectomy.   SOCIAL HISTORY:  History   Social History  . Marital Status: Legally Separated    Spouse Name: N/A    Number of Children: N/A  . Years of Education: N/A   Occupational History  . retired from [employer]    Social History Main Topics  . Smoking status: Former Smoker -- 3.00 packs/day for 45 years    Types: Cigarettes    Start date:  11/08/1963    Quit date: 12/28/2008  . Smokeless tobacco: Never Used     Comment: quit smoking 5 years ago  . Alcohol Use: Yes     Comment: 01/14/2014 "used to drink socially in the past; last drink was probably 10 yr ago"  . Drug Use: No  . Sexual Activity: Not Currently   Other Topics Concern  . Not on file   Social History Narrative   ADVANCED DIRECTIVES: <no information>  HEALTH MAINTENANCE: History  Substance Use Topics  . Smoking status: Former Smoker -- 3.00 packs/day for 45 years    Types: Cigarettes    Start date: 11/08/1963     Quit date: 12/28/2008  . Smokeless tobacco: Never Used     Comment: quit smoking 5 years ago  . Alcohol Use: Yes     Comment: 01/14/2014 "used to drink socially in the past; last drink was probably 10 yr ago"   Colonoscopy: PAP: Bone density: Lipid panel:  Allergies  Allergen Reactions  . Ace Inhibitors Cough  . Clindamycin Rash    Current Outpatient Prescriptions  Medication Sig Dispense Refill  . amiodarone (PACERONE) 200 MG tablet Take 1 tablet (200 mg total) by mouth daily.    Marland Kitchen apixaban (ELIQUIS) 5 MG TABS tablet Take 1 tablet (5 mg total) by mouth 2 (two) times daily. 60 tablet   . Ascorbic Acid (VITAMIN C) 500 MG CAPS Take 500 mg by mouth daily.     . budesonide-formoterol (SYMBICORT) 160-4.5 MCG/ACT inhaler Inhale 2 puffs into the lungs 2 (two) times daily.    . Ca & Phos-Vit D-Mag (CALCIUM) [phone] TABS Take 1 tablet by mouth daily.     . calcium-vitamin D (OSCAL WITH D) 500-200 MG-UNIT per tablet Take 1 tablet by mouth daily with breakfast.    . Cholecalciferol (VITAMIN D) 2000 UNITS tablet Take 2,000 Units by mouth daily.    Marland Kitchen diltiazem (CARDIZEM CD) 180 MG 24 hr capsule Take 1 capsule (180 mg total) by mouth daily. 30 capsule 3  . ezetimibe-simvastatin (VYTORIN) 10-40 MG per tablet Take 1 tablet by mouth at bedtime.     . ferrous sulfate 325 (65 FE) MG tablet Take 325 mg by mouth daily with breakfast.    . furosemide (LASIX) 80 MG tablet Take 2 tablets (160 mg total) by mouth 2 (two) times daily. MAKE SURE TO TAKE AT 8 AM AND 2 PM    . glyBURIDE micronized (GLYNASE) 3 MG tablet Take 3 mg by mouth daily.     . insulin aspart (NOVOLOG) 100 UNIT/ML injection Inject 3 Units into the skin 2 (two) times daily at 10 am and 4 pm.    . Magnesium-Zinc 133.33-5 MG TABS Take 133 mg by mouth daily.    . metoprolol (LOPRESSOR) 50 MG tablet Take 1 tablet (50 mg total) by mouth 2 (two) times daily. 60 tablet 3  . Multiple Vitamins-Iron (MULTIVITAMIN/IRON PO) Take 1 tablet by  mouth daily.    Marland Kitchen omeprazole (PRILOSEC) 20 MG capsule Take 20 mg by mouth daily.    . potassium chloride SA (K-DUR,KLOR-CON) 20 MEQ tablet Take 20 mEq by mouth 3 (three) times daily. MAKE SURE TO TAKE EXTRA 20 MEQ ON METOLAZONE DAYS    . PROAIR HFA 108 (90 BASE) MCG/ACT inhaler 1 puff every 4 (four) hours as needed.    . sitaGLIPtin (JANUVIA) 100 MG tablet Take 100 mg by mouth daily.    Marland Kitchen tiotropium (SPIRIVA) 18 MCG inhalation capsule Place 18 mcg into  inhaler and inhale daily.    . vitamin B-12 (CYANOCOBALAMIN) 500 MCG tablet Take 500 mcg by mouth daily.    Marland Kitchen acetaminophen (TYLENOL) 325 MG tablet Take 325 mg by mouth daily as needed for mild pain.     . metolazone (ZAROXOLYN) 2.5 MG tablet Take 2.5 mg by mouth daily as needed. Weight gain 3 lbs in 1 day or 5 lbs in 1 wk     No current facility-administered medications for this visit.   OBJECTIVE: Filed Vitals:   12/22/14 1027  BP: 125/41  Pulse: 60  Temp: 97.5 F (36.4 C)  Resp: 16   Body mass index is 46.1 kg/(m^2). ECOG FS:0 - Asymptomatic Ocular: Sclerae unicteric, pupils equal, round and reactive to light Ear-nose-throat: Oropharynx clear, dentition fair Lymphatic: No cervical or supraclavicular adenopathy Lungs no rales or rhonchi, good excursion bilaterally Heart regular rate and rhythm, no murmur appreciated Abd soft, nontender, positive bowel sounds MSK no focal spinal tenderness, no joint edema Neuro: non-focal, well-oriented, appropriate affect Breasts: Deferred  LAB RESULTS: CMP     Component Value Date/Time   NA 138 09/03/2014 1152   K 4.1 09/03/2014 1152   CL 98 09/03/2014 1152   CO2 28 09/03/2014 1152   GLUCOSE 98 09/03/2014 1152   BUN 29* 09/03/2014 1152   CREATININE 1.42* 09/03/2014 1152   CALCIUM 9.7 09/03/2014 1152   PROT 6.6 06/18/2014 1200   ALBUMIN 3.9 06/18/2014 1200   AST 19 06/18/2014 1200   ALT 19 06/18/2014 1200   ALKPHOS 69 06/18/2014 1200   BILITOT 0.4 06/18/2014 1200   GFRNONAA 37*  09/03/2014 1152   GFRAA 43* 09/03/2014 1152   No results found for: SPEP Lab Results  Component Value Date   WBC 9.9 12/22/2014   NEUTROABS 7.8* 12/22/2014   HGB 11.4* 12/22/2014   HCT 35.6 12/22/2014   MCV 99 12/22/2014   PLT 232 12/22/2014   No results found for: LABCA2 No components found for: BHALP379 No results for input(s): INR in the last 168 hours.  STUDIES: No results found.  ASSESSMENT/PLAN: Ms. Mccarrell is 69 year old [place] female with iron deficiency anemia secondary to renal failure. She is feeling much better and did very well with the iron she received in June. Her quality of life has really improved.  Her Hgb is 11.4 and MCV 99. We will see what her iron studies show.  She does not need Aranesp today.  We will see her back in 2 months for labs and follow-up.  She knows to call here with any questions or concerns and to go to the ED in the event of an emergency. We can certainly see her sooner if need be.    Eliezer Bottom, NP 12/22/2014 10:49 AM

## 2015-02-18 ENCOUNTER — Other Ambulatory Visit: Payer: Self-pay | Admitting: Family

## 2015-03-23 ENCOUNTER — Encounter: Payer: Self-pay | Admitting: Family

## 2015-03-23 ENCOUNTER — Other Ambulatory Visit (HOSPITAL_BASED_OUTPATIENT_CLINIC_OR_DEPARTMENT_OTHER): Payer: Medicare Other

## 2015-03-23 ENCOUNTER — Ambulatory Visit (HOSPITAL_BASED_OUTPATIENT_CLINIC_OR_DEPARTMENT_OTHER): Payer: Medicare Other | Admitting: Family

## 2015-03-23 VITALS — BP 133/44 | HR 61 | Temp 97.5°F | Resp 16 | Wt 289.0 lb

## 2015-03-23 DIAGNOSIS — D509 Iron deficiency anemia, unspecified: Secondary | ICD-10-CM

## 2015-03-23 DIAGNOSIS — Z7901 Long term (current) use of anticoagulants: Secondary | ICD-10-CM | POA: Diagnosis not present

## 2015-03-23 LAB — COMPREHENSIVE METABOLIC PANEL
ALK PHOS: 86 U/L (ref 39–117)
ALT: 17 U/L (ref 0–35)
AST: 17 U/L (ref 0–37)
Albumin: 3.9 g/dL (ref 3.5–5.2)
BUN: 32 mg/dL — AB (ref 6–23)
CHLORIDE: 99 meq/L (ref 96–112)
CO2: 30 mEq/L (ref 19–32)
CREATININE: 1.33 mg/dL — AB (ref 0.50–1.10)
Calcium: 9.5 mg/dL (ref 8.4–10.5)
Glucose, Bld: 180 mg/dL — ABNORMAL HIGH (ref 70–99)
POTASSIUM: 4.3 meq/L (ref 3.5–5.3)
Sodium: 139 mEq/L (ref 135–145)
TOTAL PROTEIN: 6.7 g/dL (ref 6.0–8.3)
Total Bilirubin: 0.4 mg/dL (ref 0.2–1.2)

## 2015-03-23 LAB — CBC WITH DIFFERENTIAL (CANCER CENTER ONLY)
BASO#: 0 10*3/uL (ref 0.0–0.2)
BASO%: 0.3 % (ref 0.0–2.0)
EOS%: 1.8 % (ref 0.0–7.0)
Eosinophils Absolute: 0.2 10*3/uL (ref 0.0–0.5)
HCT: 36 % (ref 34.8–46.6)
HEMOGLOBIN: 11.6 g/dL (ref 11.6–15.9)
LYMPH#: 0.9 10*3/uL (ref 0.9–3.3)
LYMPH%: 10.2 % — ABNORMAL LOW (ref 14.0–48.0)
MCH: 31.4 pg (ref 26.0–34.0)
MCHC: 32.2 g/dL (ref 32.0–36.0)
MCV: 97 fL (ref 81–101)
MONO#: 0.7 10*3/uL (ref 0.1–0.9)
MONO%: 7.3 % (ref 0.0–13.0)
NEUT#: 7.4 10*3/uL — ABNORMAL HIGH (ref 1.5–6.5)
NEUT%: 80.4 % — ABNORMAL HIGH (ref 39.6–80.0)
Platelets: 248 10*3/uL (ref 145–400)
RBC: 3.7 10*6/uL (ref 3.70–5.32)
RDW: 14.5 % (ref 11.1–15.7)
WBC: 9.2 10*3/uL (ref 3.9–10.0)

## 2015-03-23 LAB — IRON AND TIBC CHCC
%SAT: 29 % (ref 21–57)
Iron: 73 ug/dL (ref 41–142)
TIBC: 250 ug/dL (ref 236–444)
UIBC: 177 ug/dL (ref 120–384)

## 2015-03-23 LAB — FERRITIN CHCC: Ferritin: 188 ng/ml (ref 9–269)

## 2015-03-23 LAB — RETICULOCYTES (CHCC)
ABS Retic: 72 10*3/uL (ref 19.0–186.0)
RBC.: 3.79 MIL/uL — ABNORMAL LOW (ref 3.87–5.11)
RETIC CT PCT: 1.9 % (ref 0.4–2.3)

## 2015-03-23 NOTE — Progress Notes (Signed)
Hematology and Oncology Follow Up Visit  Paige Hardin [id number] Jan 08, 1946 69 y.o. 03/23/2015   Principle Diagnosis:  Anemia secondary to iron deficiency  Anemia secondary to renal insufficiency  Chronic anticoagulation for paroxysmal atrial fibrillation  Current Therapy:   IV iron as indicated  Aranesp 300 mcg subcutaneous as needed for hemoglobin less than 11  ELIQUIS-long-term    Interim History: Paige Hardin is here today with her friend for a follow-up. She is still doing well. She has no complaints at this time. She is still living in an assisted living facility.  She last received iron in June 2015. Her last iron studies in January showed an iron saturation of 28% and ferritin 211.   She denies fever, chills, n/v, cough, rash, headaches, dizziness, chest pain, palpitations, abdominal pain, constipation, diarrhea, blood in urine or stool. She does have SOB with any exertion and is on supplemental oxygen.  No episodes of bleeding or bruising on the Eliquis.   She denies swelling, tenderness, numbness or tingling in her extremities. No new aches or pains.  Her appetite is good and she is staying hydrated. Her weight is stable.  Medications:    Medication List       This list is accurate as of: 03/23/15 11:17 AM.  Always use your most recent med list.               acetaminophen 325 MG tablet  Commonly known as:  TYLENOL  Take 325 mg by mouth daily as needed for mild pain.     amiodarone 200 MG tablet  Commonly known as:  PACERONE  Take 1 tablet (200 mg total) by mouth daily.     apixaban 5 MG Tabs tablet  Commonly known as:  ELIQUIS  Take 1 tablet (5 mg total) by mouth 2 (two) times daily.     budesonide-formoterol 160-4.5 MCG/ACT inhaler  Commonly known as:  SYMBICORT  Inhale 2 puffs into the lungs 2 (two) times daily.     Calcium [id number] Tabs  Take 1 tablet by mouth daily.     calcium-vitamin D 500-200 MG-UNIT per tablet  Commonly known as:  OSCAL  WITH D  Take 1 tablet by mouth daily with breakfast.     diltiazem 180 MG 24 hr capsule  Commonly known as:  CARDIZEM CD  Take 1 capsule (180 mg total) by mouth daily.     ezetimibe-simvastatin 10-40 MG per tablet  Commonly known as:  VYTORIN  Take 1 tablet by mouth at bedtime.     ferrous sulfate 325 (65 FE) MG tablet  Take 325 mg by mouth daily with breakfast.     furosemide 80 MG tablet  Commonly known as:  LASIX  Take 2 tablets (160 mg total) by mouth 2 (two) times daily. MAKE SURE TO TAKE AT 8 AM AND 2 PM     glimepiride 2 MG tablet  Commonly known as:  AMARYL     glyBURIDE micronized 3 MG tablet  Commonly known as:  GLYNASE  Take 3 mg by mouth daily.     insulin aspart 100 UNIT/ML injection  Commonly known as:  novoLOG  Inject 3 Units into the skin 2 (two) times daily at 10 am and 4 pm.     Magnesium-Zinc 133.33-5 MG Tabs  Take 133 mg by mouth daily.     metolazone 2.5 MG tablet  Commonly known as:  ZAROXOLYN  Take 2.5 mg by mouth daily as needed. Weight gain 3 lbs in  1 day or 5 lbs in 1 wk     metoprolol 50 MG tablet  Commonly known as:  LOPRESSOR  Take 1 tablet (50 mg total) by mouth 2 (two) times daily.     MULTIVITAMIN/IRON PO  Take 1 tablet by mouth daily.     omeprazole 20 MG capsule  Commonly known as:  PRILOSEC  Take 20 mg by mouth daily.     potassium chloride SA 20 MEQ tablet  Commonly known as:  K-DUR,KLOR-CON  Take 20 mEq by mouth 3 (three) times daily. MAKE SURE TO TAKE EXTRA 20 MEQ ON METOLAZONE DAYS     PROAIR HFA 108 (90 BASE) MCG/ACT inhaler  Generic drug:  albuterol  1 puff every 4 (four) hours as needed.     SAFETY-LOK INSULIN SYR 1CC/29G 29G X 1/2" 1 ML Misc  Generic drug:  INSULIN SYRINGE 1CC/29G     sitaGLIPtin 100 MG tablet  Commonly known as:  JANUVIA  Take 100 mg by mouth daily.     tiotropium 18 MCG inhalation capsule  Commonly known as:  SPIRIVA  Place 18 mcg into inhaler and inhale daily.     vitamin B-12 500 MCG  tablet  Commonly known as:  CYANOCOBALAMIN  Take 500 mcg by mouth daily.     Vitamin C 500 MG Caps  Take 500 mg by mouth daily.     Vitamin D 2000 UNITS tablet  Take 2,000 Units by mouth daily.        Allergies:  Allergies  Allergen Reactions  . Ace Inhibitors Cough  . Clindamycin Rash    Past Medical History, Surgical history, Social history, and Family History were reviewed and updated.  Review of Systems: All other 10 point review of systems is negative.   Physical Exam:  weight is 289 lb (131.09 kg). Her oral temperature is 97.5 F (36.4 C). Her blood pressure is 133/44 and her pulse is 61. Her respiration is 16.   Wt Readings from Last 3 Encounters:  03/23/15 289 lb (131.09 kg)  12/22/14 277 lb (125.646 kg)  11/10/14 273 lb 6.4 oz (124.013 kg)    Ocular: Sclerae unicteric, pupils equal, round and reactive to light Ear-nose-throat: Oropharynx clear, dentition fair Lymphatic: No cervical or supraclavicular adenopathy Lungs no rales or rhonchi, good excursion bilaterally Heart regular rate and rhythm, no murmur appreciated Abd soft, nontender, positive bowel sounds MSK no focal spinal tenderness, no joint edema Neuro: non-focal, well-oriented, appropriate affect Breasts: Deferred  Lab Results  Component Value Date   WBC 9.2 03/23/2015   HGB 11.6 03/23/2015   HCT 36.0 03/23/2015   MCV 97 03/23/2015   PLT 248 03/23/2015   Lab Results  Component Value Date   FERRITIN 211 12/22/2014   IRON 70 12/22/2014   TIBC 248 12/22/2014   UIBC 177 12/22/2014   IRONPCTSAT 28 12/22/2014   Lab Results  Component Value Date   RETICCTPCT 1.8 12/22/2014   RBC 3.70 03/23/2015   RETICCTABS 65.3 12/22/2014   No results found for: KPAFRELGTCHN, LAMBDASER, KAPLAMBRATIO No results found for: IGGSERUM, IGA, IGMSERUM No results found for: TOTALPROTELP, ALBUMINELP, A1GS, A2GS, Violet Baldy, MSPIKE, SPEI   Chemistry      Component Value Date/Time   NA 139 12/22/2014  1019   NA 138 09/03/2014 1152   K 4.2 12/22/2014 1019   K 4.1 09/03/2014 1152   CL 96* 12/22/2014 1019   CL 98 09/03/2014 1152   CO2 31 12/22/2014 1019   CO2 28  09/03/2014 1152   BUN 32* 12/22/2014 1019   BUN 29* 09/03/2014 1152   CREATININE 1.5* 12/22/2014 1019   CREATININE 1.42* 09/03/2014 1152      Component Value Date/Time   CALCIUM 9.4 12/22/2014 1019   CALCIUM 9.7 09/03/2014 1152   ALKPHOS 76 12/22/2014 1019   ALKPHOS 69 06/18/2014 1200   AST 19 12/22/2014 1019   AST 19 06/18/2014 1200   ALT 18 12/22/2014 1019   ALT 19 06/18/2014 1200   BILITOT 0.60 12/22/2014 1019   BILITOT 0.4 06/18/2014 1200     Impression and Plan: Ms. Quijas is 69 year old [place] female with multifactorial anemia (iron deficiency/renal insufficiency). She is doing well and is asymptomatic at this time.  Her Hgb is 11.6 and MCV 97. We will see what her iron studies show and bring her back in later this week for Feraheme if needed. She does not need Aranesp today.  She is due for a mammogram. We will get that scheduled.  We will see her back in 2 months for labs and follow-up.  She knows to call here with any questions or concerns and to go to the ED in the event of an emergency. We can certainly see her sooner if need be.   Eliezer Bottom, NP 4/26/201611:17 AM

## 2015-03-24 ENCOUNTER — Telehealth: Payer: Self-pay | Admitting: Hematology & Oncology

## 2015-03-24 NOTE — Telephone Encounter (Signed)
Pt aware of 03-30-15 appointment at 12 pm. I faxed order to [facility] who made the appointment at [facility]

## 2015-03-30 DIAGNOSIS — Z803 Family history of malignant neoplasm of breast: Secondary | ICD-10-CM | POA: Diagnosis not present

## 2015-03-30 DIAGNOSIS — Z1231 Encounter for screening mammogram for malignant neoplasm of breast: Secondary | ICD-10-CM | POA: Diagnosis not present

## 2015-04-02 ENCOUNTER — Encounter: Payer: Self-pay | Admitting: Hematology & Oncology

## 2015-04-08 DIAGNOSIS — N6011 Diffuse cystic mastopathy of right breast: Secondary | ICD-10-CM | POA: Diagnosis not present

## 2015-04-08 DIAGNOSIS — N6012 Diffuse cystic mastopathy of left breast: Secondary | ICD-10-CM | POA: Diagnosis not present

## 2015-04-13 ENCOUNTER — Ambulatory Visit (HOSPITAL_COMMUNITY)
Admission: RE | Admit: 2015-04-13 | Discharge: 2015-04-13 | Disposition: A | Discharge status: Home / Self Care | Payer: Medicare Other | Source: Ambulatory Visit | Attending: Cardiology | Admitting: Cardiology

## 2015-04-13 ENCOUNTER — Encounter (HOSPITAL_COMMUNITY): Payer: Self-pay

## 2015-04-13 VITALS — BP 134/78 | HR 58 | Ht 65.5 in | Wt 292.0 lb

## 2015-04-13 DIAGNOSIS — D649 Anemia, unspecified: Secondary | ICD-10-CM | POA: Insufficient documentation

## 2015-04-13 DIAGNOSIS — Z87891 Personal history of nicotine dependence: Secondary | ICD-10-CM | POA: Diagnosis not present

## 2015-04-13 DIAGNOSIS — Z794 Long term (current) use of insulin: Secondary | ICD-10-CM | POA: Insufficient documentation

## 2015-04-13 DIAGNOSIS — I129 Hypertensive chronic kidney disease with stage 1 through stage 4 chronic kidney disease, or unspecified chronic kidney disease: Secondary | ICD-10-CM | POA: Insufficient documentation

## 2015-04-13 DIAGNOSIS — E1122 Type 2 diabetes mellitus with diabetic chronic kidney disease: Secondary | ICD-10-CM | POA: Diagnosis not present

## 2015-04-13 DIAGNOSIS — Z79899 Other long term (current) drug therapy: Secondary | ICD-10-CM | POA: Insufficient documentation

## 2015-04-13 DIAGNOSIS — E119 Type 2 diabetes mellitus without complications: Secondary | ICD-10-CM | POA: Insufficient documentation

## 2015-04-13 DIAGNOSIS — I480 Paroxysmal atrial fibrillation: Secondary | ICD-10-CM | POA: Diagnosis not present

## 2015-04-13 DIAGNOSIS — Z7902 Long term (current) use of antithrombotics/antiplatelets: Secondary | ICD-10-CM | POA: Diagnosis not present

## 2015-04-13 DIAGNOSIS — I5032 Chronic diastolic (congestive) heart failure: Secondary | ICD-10-CM

## 2015-04-13 DIAGNOSIS — J449 Chronic obstructive pulmonary disease, unspecified: Secondary | ICD-10-CM | POA: Insufficient documentation

## 2015-04-13 DIAGNOSIS — Z9981 Dependence on supplemental oxygen: Secondary | ICD-10-CM | POA: Insufficient documentation

## 2015-04-13 DIAGNOSIS — I272 Pulmonary hypertension, unspecified: Secondary | ICD-10-CM

## 2015-04-13 DIAGNOSIS — N183 Chronic kidney disease, stage 3 (moderate): Secondary | ICD-10-CM | POA: Diagnosis not present

## 2015-04-13 DIAGNOSIS — I270 Primary pulmonary hypertension: Secondary | ICD-10-CM

## 2015-04-13 DIAGNOSIS — E785 Hyperlipidemia, unspecified: Secondary | ICD-10-CM | POA: Diagnosis not present

## 2015-04-13 LAB — BASIC METABOLIC PANEL
Anion gap: 11 (ref 5–15)
BUN: 26 mg/dL — AB (ref 6–20)
CALCIUM: 9.5 mg/dL (ref 8.9–10.3)
CO2: 30 mmol/L (ref 22–32)
Chloride: 98 mmol/L — ABNORMAL LOW (ref 101–111)
Creatinine, Ser: 1.34 mg/dL — ABNORMAL HIGH (ref 0.44–1.00)
GFR calc Af Amer: 46 mL/min — ABNORMAL LOW (ref >60)
GFR, EST NON AFRICAN AMERICAN: 39 mL/min — AB (ref >60)
GLUCOSE: 161 mg/dL — AB (ref 65–99)
Potassium: 4.3 mmol/L (ref 3.5–5.1)
Sodium: 139 mmol/L (ref 135–145)

## 2015-04-13 LAB — LIPID PANEL
CHOLESTEROL: 142 mg/dL (ref 0–200)
HDL: 46 mg/dL (ref >40)
LDL Cholesterol: 63 mg/dL (ref 0–99)
TRIGLYCERIDES: 164 mg/dL — AB (ref <150)
Total CHOL/HDL Ratio: 3.1 RATIO
VLDL: 33 mg/dL (ref 0–40)

## 2015-04-13 LAB — HEPATIC FUNCTION PANEL
ALT: 18 U/L (ref 14–54)
AST: 20 U/L (ref 15–41)
Albumin: 3.6 g/dL (ref 3.5–5.0)
Alkaline Phosphatase: 89 U/L (ref 38–126)
BILIRUBIN DIRECT: 0.1 mg/dL (ref 0.1–0.5)
Indirect Bilirubin: 0.4 mg/dL (ref 0.3–0.9)
Total Bilirubin: 0.5 mg/dL (ref 0.3–1.2)
Total Protein: 6.9 g/dL (ref 6.5–8.1)

## 2015-04-13 LAB — TSH: TSH: 3.42 u[IU]/mL (ref 0.350–4.500)

## 2015-04-13 MED ORDER — METOLAZONE 2.5 MG PO TABS: 2.5000 mg | ORAL_TABLET | ORAL | Status: DC

## 2015-04-13 NOTE — Patient Instructions (Signed)
START Metolazone 2.5 mg, one tab every Wednesday BE SURE to add additional 20 MeQ of Potassium on days you take Metolazone  Your physician has requested that you have an echocardiogram. Echocardiography is a painless test that uses sound waves to create images of your heart. It provides your doctor with information about the size and shape of your heart and how well your heart's chambers and valves are working. This procedure takes approximately one hour. There are no restrictions for this procedure.  Labs today and again in 2 weeks (BMET)  Your physician recommends that you schedule a follow-up appointment in: 1 month

## 2015-04-13 NOTE — Progress Notes (Signed)
Patient ID: Paige Hardin, female   DOB: [birth year], 69 y.o.   MRN: [id number] PCP: Dr. Tollie Pizza Pulmonologist: Dr. Gwenette Greet   69 yo with history of COPD on home oxygen, paroxysmal atrial fibrillation, and chronic diastolic CHF. She was admitted for several weeks from 3/15 to 4/15 with atrial fibrillation/RVR, acute on chronic diastolic CHF, and COPD exacerbation.  She ended up being cardioverted in the hospital.  She was diuresed with IV Lasix.  After the prolonged hospitalization, she was sent to [facility].  She was again admitted at the end of 4/15 and hospitalized into 5/15 with acute on chronic diastolic CHF. She was intubated this time and went back into atrial fibrillation with RVR.  She was cardioverted back to NSR and is maintained on amiodarone.  She was again diuresed and discharged to SNF [facility]).    Paitient returns for followup today.  Weight is up 28 lbs.  She says that this has been gradual.  She did not do much walking over the winter.  She is trying to start walking again at [facility], using a walker to go up and down the halls.  She is short of breath after walking about 100 yards.  Her balance is poor but no falls.  No palpitations.  She is in NSR today. She has chronic orthopnea and has slept in a recliner for a long time. She continues to use oxygen at all times. Occasional non-exertional chest tightness with no particular trigger.   Labs (4/15): K 4.4, creatinine 1.4 Labs (5/15): creatinine 1.3, hemoglobin 8.5 Labs (04/30/14 ): K 3.6, creatinine 1.6 Labs (05/05/14) : K 4.4, creatinine 1.42, TSH 3.69, pro-BNP 999, AST 15, ALT 14 Labs (06/18/14): K 4.1, creatinine 1.55, AST 19, ALT 19 Labs (4/16): K 4.3, creatinine 1.33, HCT 36  ECG: NSR, inferior Qs  1. Chronic low back pain.  2. COPD: On home oxygen. Prior smoker.  3. Type 2 diabetes.  4. Paroxysmal atrial fibrillation:  DCCV in 2010 and again in 4/15.  She is on warfarin and is on amiodarone to maintain NSR.  5.  Hyperlipidemia.  6. Diastolic congestive heart failure: Echo (2/15) with EF 60%, mild MR, PA systolic pressure 53 mmHg. 7. Hypertension.  8. Low back pain 9. Obesity 10. CKD 11. Anemia: Fe deficiency 12. RLL nodule  13. ACEI cough  SOCIAL HISTORY: Prior smoker.  Patient is currently in SNF [facility]).  FAMILY HISTORY: Non-Hodgkin lymphoma and breast cancer. No early-onset  coronary artery disease.   REVIEW OF SYSTEMS: Negative except as noted in the history of present  Illness.  Current Outpatient Prescriptions  Medication Sig Dispense Refill  . acetaminophen (TYLENOL) 325 MG tablet Take 325 mg by mouth daily as needed for mild pain.     Marland Kitchen amiodarone (PACERONE) 200 MG tablet Take 1 tablet (200 mg total) by mouth daily.    Marland Kitchen apixaban (ELIQUIS) 5 MG TABS tablet Take 1 tablet (5 mg total) by mouth 2 (two) times daily. 60 tablet   . Ascorbic Acid (VITAMIN C) 500 MG CAPS Take 500 mg by mouth daily.     . budesonide-formoterol (SYMBICORT) 160-4.5 MCG/ACT inhaler Inhale 2 puffs into the lungs 2 (two) times daily.    . Ca & Phos-Vit D-Mag (CALCIUM) [phone] TABS Take 1 tablet by mouth daily.     . calcium-vitamin D (OSCAL WITH D) 500-200 MG-UNIT per tablet Take 1 tablet by mouth daily with breakfast.    . Cholecalciferol (VITAMIN D) 2000 UNITS tablet Take 2,000 Units  by mouth daily.    Marland Kitchen diltiazem (CARDIZEM CD) 180 MG 24 hr capsule Take 1 capsule (180 mg total) by mouth daily. 30 capsule 3  . ezetimibe-simvastatin (VYTORIN) 10-40 MG per tablet Take 1 tablet by mouth at bedtime.     . ferrous sulfate 325 (65 FE) MG tablet Take 325 mg by mouth daily with breakfast.    . furosemide (LASIX) 80 MG tablet Take 2 tablets (160 mg total) by mouth 2 (two) times daily. MAKE SURE TO TAKE AT 8 AM AND 2 PM    . glimepiride (AMARYL) 2 MG tablet     . glyBURIDE micronized (GLYNASE) 3 MG tablet Take 3 mg by mouth daily.     . insulin aspart (NOVOLOG) 100 UNIT/ML injection Inject 3 Units into the  skin 2 (two) times daily at 10 am and 4 pm.    . Magnesium-Zinc 133.33-5 MG TABS Take 133 mg by mouth daily.    . metolazone (ZAROXOLYN) 2.5 MG tablet Take 1 tablet (2.5 mg total) by mouth every Wednesday. Or as needed for Weight gain 3 lbs in 1 day or 5 lbs in 1 wk 10 tablet 2  . metoprolol (LOPRESSOR) 50 MG tablet Take 1 tablet (50 mg total) by mouth 2 (two) times daily. 60 tablet 3  . Multiple Vitamins-Iron (MULTIVITAMIN/IRON PO) Take 1 tablet by mouth daily.    Marland Kitchen omeprazole (PRILOSEC) 20 MG capsule Take 20 mg by mouth daily.    . potassium chloride SA (K-DUR,KLOR-CON) 20 MEQ tablet Take 20 mEq by mouth 3 (three) times daily. MAKE SURE TO TAKE EXTRA 20 MEQ ON METOLAZONE DAYS    . PROAIR HFA 108 (90 BASE) MCG/ACT inhaler 1 puff every 4 (four) hours as needed.    Marland Kitchen SAFETY-LOK INSULIN SYR 1CC/29G 29G X 1/2" 1 ML MISC     . sitaGLIPtin (JANUVIA) 100 MG tablet Take 100 mg by mouth daily.    Marland Kitchen tiotropium (SPIRIVA) 18 MCG inhalation capsule Place 18 mcg into inhaler and inhale daily.    . vitamin B-12 (CYANOCOBALAMIN) 500 MCG tablet Take 500 mcg by mouth daily.     No current facility-administered medications for this encounter.    Filed Vitals:   04/13/15 1139  BP: 134/78  Pulse: 58  Height: 5' 5.5" (1.664 m)  Weight: 292 lb (132.45 kg)  SpO2: 93%    General:  NAD, on 2L, in wheelchair  Neck: Thick, JVP 10, no thyromegaly or thyroid nodule.  Lungs: Distant breath sounds bilaterally CV: Nondisplaced PMI.  Heart regular S1/S2, no S3/S4, 2/6 early SEM RUSB. 1+ ankle edema.  No carotid bruit.   Abdomen: Soft, nontender, no hepatosplenomegaly, no distention.  Skin: Intact without lesions or rashes.  Neurologic: Alert and oriented x 3.  Psych: Normal affect. Extremities: No clubbing or cyanosis.   Assessment/Plan:  1. Atrial fibrillation: Paroxysmal. She is in NSR today. Will continue amiodarone 200 mg daily and Eliquis 5 mg twice a day.  Check LFTs and TSH.  Will need yearly eye exam.   She was evaluated by EP in hospital during her last admission, recommended against AV nodal ablation/pacing.  2. Chronic diastolic CHF: EF 90% (12/4095).  She has gained considerable weight and has volume overload on exam.  - Continue lasix 160 mg BID.  - Add metolazone 2.5 mg once a week on Wednesdays before am torsemide.  She will take an extra KCl 20 on metolazone days.   - BMET today.  Repeat BMET in 2 wks and  followup in 1 month.  - I will repeat echocardiogram to make sure that EF remains preserved and assess RV function.  - Reinforced the need and importance of daily weights, a low sodium diet, and fluid restriction (less than 2 L a day). Instructed to call the HF clinic if weight increases more than 3 lbs overnight or 5 lbs in a week.  4. CKD stage III: baseline Cr 1.3-1.5. Will continue to follow. Check BMET today.  5. Anemia: Per Dr Marin Olp.    Loralie Champagne 04/13/2015

## 2015-04-15 ENCOUNTER — Other Ambulatory Visit: Payer: Self-pay | Admitting: Radiology

## 2015-04-15 DIAGNOSIS — Z803 Family history of malignant neoplasm of breast: Secondary | ICD-10-CM | POA: Diagnosis not present

## 2015-04-15 DIAGNOSIS — D241 Benign neoplasm of right breast: Secondary | ICD-10-CM | POA: Diagnosis not present

## 2015-04-15 DIAGNOSIS — N6489 Other specified disorders of breast: Secondary | ICD-10-CM | POA: Diagnosis not present

## 2015-04-15 DIAGNOSIS — N63 Unspecified lump in breast: Secondary | ICD-10-CM | POA: Diagnosis not present

## 2015-04-27 ENCOUNTER — Ambulatory Visit (HOSPITAL_BASED_OUTPATIENT_CLINIC_OR_DEPARTMENT_OTHER)
Admission: RE | Admit: 2015-04-27 | Discharge: 2015-04-27 | Disposition: A | Discharge status: Home / Self Care | Payer: Medicare Other | Source: Ambulatory Visit | Attending: Cardiology | Admitting: Cardiology

## 2015-04-27 ENCOUNTER — Ambulatory Visit (HOSPITAL_COMMUNITY)
Admission: RE | Admit: 2015-04-27 | Discharge: 2015-04-27 | Disposition: A | Discharge status: Home / Self Care | Payer: Medicare Other | Source: Ambulatory Visit | Attending: Cardiology | Admitting: Cardiology

## 2015-04-27 DIAGNOSIS — I340 Nonrheumatic mitral (valve) insufficiency: Secondary | ICD-10-CM | POA: Diagnosis not present

## 2015-04-27 DIAGNOSIS — I5022 Chronic systolic (congestive) heart failure: Secondary | ICD-10-CM

## 2015-04-27 DIAGNOSIS — I5032 Chronic diastolic (congestive) heart failure: Secondary | ICD-10-CM

## 2015-04-27 DIAGNOSIS — I517 Cardiomegaly: Secondary | ICD-10-CM | POA: Diagnosis not present

## 2015-04-27 DIAGNOSIS — I272 Other secondary pulmonary hypertension: Secondary | ICD-10-CM | POA: Diagnosis present

## 2015-04-27 DIAGNOSIS — I270 Primary pulmonary hypertension: Secondary | ICD-10-CM

## 2015-04-27 LAB — BASIC METABOLIC PANEL
ANION GAP: 14 (ref 5–15)
BUN: 45 mg/dL — ABNORMAL HIGH (ref 6–20)
CHLORIDE: 93 mmol/L — AB (ref 101–111)
CO2: 30 mmol/L (ref 22–32)
CREATININE: 2.09 mg/dL — AB (ref 0.44–1.00)
Calcium: 9.3 mg/dL (ref 8.9–10.3)
GFR calc Af Amer: 27 mL/min — ABNORMAL LOW (ref >60)
GFR calc non Af Amer: 23 mL/min — ABNORMAL LOW (ref >60)
Glucose, Bld: 335 mg/dL — ABNORMAL HIGH (ref 65–99)
POTASSIUM: 4.2 mmol/L (ref 3.5–5.1)
SODIUM: 137 mmol/L (ref 135–145)

## 2015-04-27 NOTE — Progress Notes (Signed)
  Echocardiogram 2D Echocardiogram has been performed.  Paige Hardin 04/27/2015, 10:07 AM

## 2015-05-03 ENCOUNTER — Telehealth (HOSPITAL_COMMUNITY): Payer: Self-pay

## 2015-05-03 NOTE — Telephone Encounter (Signed)
Nurse with [facility] called asking for order from a recent med that was [place] from our office.  Aundra Dubin recently [place] metolazone from recent lab work, but facility never received faxed order.  Will fax now to given fax # [phone] attn: angela.  Renee Pain

## 2015-05-13 ENCOUNTER — Ambulatory Visit: Payer: Medicare Other | Admitting: Pulmonary Disease

## 2015-05-13 ENCOUNTER — Ambulatory Visit (HOSPITAL_COMMUNITY)
Admission: RE | Admit: 2015-05-13 | Discharge: 2015-05-13 | Disposition: A | Discharge status: Home / Self Care | Payer: Medicare Other | Source: Ambulatory Visit | Attending: Internal Medicine | Admitting: Internal Medicine

## 2015-05-13 ENCOUNTER — Encounter (HOSPITAL_COMMUNITY): Payer: Self-pay

## 2015-05-13 VITALS — BP 104/66 | HR 64 | Wt 292.2 lb

## 2015-05-13 DIAGNOSIS — J438 Other emphysema: Secondary | ICD-10-CM | POA: Insufficient documentation

## 2015-05-13 DIAGNOSIS — Z87891 Personal history of nicotine dependence: Secondary | ICD-10-CM | POA: Insufficient documentation

## 2015-05-13 DIAGNOSIS — N183 Chronic kidney disease, stage 3 unspecified: Secondary | ICD-10-CM

## 2015-05-13 DIAGNOSIS — I5032 Chronic diastolic (congestive) heart failure: Secondary | ICD-10-CM | POA: Insufficient documentation

## 2015-05-13 DIAGNOSIS — I480 Paroxysmal atrial fibrillation: Secondary | ICD-10-CM | POA: Diagnosis not present

## 2015-05-13 DIAGNOSIS — I159 Secondary hypertension, unspecified: Secondary | ICD-10-CM | POA: Insufficient documentation

## 2015-05-13 LAB — BASIC METABOLIC PANEL
Anion gap: 9 (ref 5–15)
BUN: 29 mg/dL — AB (ref 6–20)
CO2: 30 mmol/L (ref 22–32)
Calcium: 9.4 mg/dL (ref 8.9–10.3)
Chloride: 98 mmol/L — ABNORMAL LOW (ref 101–111)
Creatinine, Ser: 1.63 mg/dL — ABNORMAL HIGH (ref 0.44–1.00)
GFR calc Af Amer: 36 mL/min — ABNORMAL LOW (ref >60)
GFR, EST NON AFRICAN AMERICAN: 31 mL/min — AB (ref >60)
GLUCOSE: 154 mg/dL — AB (ref 65–99)
Potassium: 4.4 mmol/L (ref 3.5–5.1)
SODIUM: 137 mmol/L (ref 135–145)

## 2015-05-13 LAB — BRAIN NATRIURETIC PEPTIDE: B NATRIURETIC PEPTIDE 5: 210.7 pg/mL — AB (ref 0.0–100.0)

## 2015-05-13 LAB — URIC ACID: Uric Acid, Serum: 9.9 mg/dL — ABNORMAL HIGH (ref 2.3–6.6)

## 2015-05-13 NOTE — Progress Notes (Signed)
Advanced Heart Failure Medication Review by a Pharmacist  Does the patient  feel that his/her medications are working for him/her?  yes  Has the patient been experiencing any side effects to the medications prescribed?  no  Does the patient measure his/her own blood pressure or blood glucose at home?  no   Does the patient have any problems obtaining medications due to transportation or finances?   no  Understanding of regimen: good Understanding of indications: good Potential of compliance: excellent    Pharmacist comments: Patient presents to HF clinic and medications were reviewed with a pharmacist. She brings in a list of her medications from the nursing home - compliance is excellent. She does not report any notable side effects at this time and has no questions on her medications.   Megan E. Supple, Pharm.D Clinical Pharmacy Resident Pager: [phone] 05/13/2015 10:29 AM

## 2015-05-13 NOTE — Patient Instructions (Signed)
LABS today (bmet bnp uric acid)  FOLLOW UP in 1 month.

## 2015-05-13 NOTE — Progress Notes (Signed)
Patient ID: Paige Hardin, female   DOB: 12-17-1945, 69 y.o.   MRN: [id number] PCP: Dr. Tollie Pizza Pulmonologist: Dr. Gwenette Greet   69 yo with history of COPD on home oxygen, paroxysmal atrial fibrillation, and chronic diastolic CHF presents for followup. She was admitted for several weeks from 3/15 to 4/15 with atrial fibrillation/RVR, acute on chronic diastolic CHF, and COPD exacerbation.  She ended up being cardioverted in the hospital.  She was diuresed with IV Lasix.  After the prolonged hospitalization, she was sent to [facility].  She was again admitted at the end of 4/15 and hospitalized into 5/15 with acute on chronic diastolic CHF. She was intubated this time and went back into atrial fibrillation with RVR.  She was cardioverted back to NSR and is maintained on amiodarone.  She was again diuresed and discharged to SNF [facility]).    Patient returns for followup today.  Weight is stable.  At last appointment, she was volume overloaded and was started on metolazone once weekly.  She lost weight but creatinine rose to 2.09 so metolazone was stopped.  Currently, she is short of breath after walking about a block.  No chest pain.  No palpitations.  She is in NSR today. She has chronic orthopnea and has slept in a recliner for a long time. She continues to use oxygen at all times. Occasional non-exertional chest tightness with no particular trigger.  She had an episode of left foot MTP pain that has resolved.  She is concerned that this was gout.  +Fatigue and daytime sleepiness.   Labs (4/15): K 4.4, creatinine 1.4 Labs (5/15): creatinine 1.3, hemoglobin 8.5 Labs (04/30/14 ): K 3.6, creatinine 1.6 Labs (05/05/14) : K 4.4, creatinine 1.42, TSH 3.69, pro-BNP 999, AST 15, ALT 14 Labs (06/18/14): K 4.1, creatinine 1.55, AST 19, ALT 19 Labs (4/16): K 4.3, creatinine 1.33, HCT 36 Labs (5/16): K 4.2, creatinine 2.09, TSH normal, LFTs normal  1. Chronic low back pain.  2. COPD: On home oxygen. Prior smoker.  3.  Type 2 diabetes.  4. Paroxysmal atrial fibrillation:  DCCV in 2010 and again in 4/15.  She is on warfarin and is on amiodarone to maintain NSR.  5. Hyperlipidemia.  6. Diastolic congestive heart failure: Echo (2/15) with EF 60%, mild MR, PA systolic pressure 53 mmHg. Echo (5/16) with mild LVH, EF 55-60%, mild MR, moderate biatrial enlargement.   7. Hypertension.  8. Low back pain 9. Obesity 10. CKD 11. Anemia: Fe deficiency 12. RLL nodule  13. ACEI cough 14. Suspected gout  SOCIAL HISTORY: Prior smoker.  Patient is currently in SNF [facility]).  FAMILY HISTORY: Non-Hodgkin lymphoma and breast cancer. No early-onset  coronary artery disease.   REVIEW OF SYSTEMS: Negative except as noted in the history of present  Illness.  Current Outpatient Prescriptions  Medication Sig Dispense Refill  . acetaminophen (TYLENOL) 325 MG tablet Take 325 mg by mouth daily as needed for mild pain.     Marland Kitchen amiodarone (PACERONE) 200 MG tablet Take 1 tablet (200 mg total) by mouth daily.    Marland Kitchen apixaban (ELIQUIS) 5 MG TABS tablet Take 1 tablet (5 mg total) by mouth 2 (two) times daily. 60 tablet   . Ascorbic Acid (VITAMIN C) 500 MG CAPS Take 500 mg by mouth daily.     . budesonide-formoterol (SYMBICORT) 160-4.5 MCG/ACT inhaler Inhale 2 puffs into the lungs 2 (two) times daily.    . calcium-vitamin D (OSCAL WITH D) 500-200 MG-UNIT per tablet Take 1 tablet  by mouth daily with breakfast.    . Cholecalciferol (VITAMIN D) 2000 UNITS tablet Take 2,000 Units by mouth daily.    Marland Kitchen diltiazem (CARDIZEM CD) 180 MG 24 hr capsule Take 1 capsule (180 mg total) by mouth daily. 30 capsule 3  . ezetimibe-simvastatin (VYTORIN) 10-40 MG per tablet Take 1 tablet by mouth at bedtime.     . ferrous sulfate 325 (65 FE) MG tablet Take 325 mg by mouth 2 (two) times daily with a meal.     . furosemide (LASIX) 80 MG tablet Take 2 tablets (160 mg total) by mouth 2 (two) times daily. MAKE SURE TO TAKE AT 8 AM AND 2 PM    . glimepiride  (AMARYL) 2 MG tablet Take 2 mg by mouth daily with breakfast.     . insulin aspart (NOVOLOG) 100 UNIT/ML injection Inject 3 Units into the skin 2 (two) times daily at 10 am and 4 pm.    . Magnesium-Zinc 133.33-5 MG TABS Take 133 mg by mouth daily.    . metoprolol (LOPRESSOR) 50 MG tablet Take 1 tablet (50 mg total) by mouth 2 (two) times daily. 60 tablet 3  . Multiple Vitamins-Iron (MULTIVITAMIN/IRON PO) Take 1 tablet by mouth daily.    Marland Kitchen omeprazole (PRILOSEC) 20 MG capsule Take 20 mg by mouth daily.    . potassium chloride SA (K-DUR,KLOR-CON) 20 MEQ tablet Take 20 mEq by mouth 3 (three) times daily. MAKE SURE TO TAKE EXTRA 20 MEQ ON METOLAZONE DAYS    . PROAIR HFA 108 (90 BASE) MCG/ACT inhaler 1 puff every 4 (four) hours as needed.    Marland Kitchen SAFETY-LOK INSULIN SYR 1CC/29G 29G X 1/2" 1 ML MISC     . sitaGLIPtin (JANUVIA) 100 MG tablet Take 100 mg by mouth daily.    Marland Kitchen tiotropium (SPIRIVA) 18 MCG inhalation capsule Place 18 mcg into inhaler and inhale daily.    . vitamin B-12 (CYANOCOBALAMIN) 500 MCG tablet Take 500 mcg by mouth daily.     No current facility-administered medications for this encounter.    Filed Vitals:   05/13/15 1016  BP: 104/66  Pulse: 64  Weight: 292 lb 4 oz (132.564 kg)  SpO2: 95%    General:  NAD, on 2L oxygen by [place] Neck: Thick, no JVD, no thyromegaly or thyroid nodule.  Lungs: Distant breath sounds bilaterally CV: Nondisplaced PMI.  Heart regular S1/S2, no S3/S4, 1/6 early SEM RUSB. Trace ankle edema.  No carotid bruit.   Abdomen: Soft, nontender, no hepatosplenomegaly, no distention.  Skin: Intact without lesions or rashes.  Neurologic: Alert and oriented x 3.  Psych: Normal affect. Extremities: No clubbing or cyanosis.   Assessment/Plan:  1. Atrial fibrillation: Paroxysmal. She is in NSR today. Will continue amiodarone 200 mg daily and Eliquis 5 mg twice a day.  Recent LFTs and TSH normal.  Will need yearly eye exam.  She was evaluated by EP in hospital during  her last admission, recommended against AV nodal ablation/pacing.  2. Chronic diastolic CHF: EF 16-60% (6/30).  Volume looks stable today.  She is no longer taking metolazone due to elevated creatinine.   - Continue lasix 160 mg BID, will recheck BMET/BNP today.    - She would likely benefit from Cardiomems device but unfortunately does not qualify given no hospitalization in the last year.  - Reinforced the need and importance of daily weights, a low sodium diet, and fluid restriction (less than 2 L a day). Instructed to call the HF clinic if weight  increases more than 3 lbs overnight or 5 lbs in a week.  4. CKD stage III: Rise in creatinine when getting metolazone.  Will repeat BMET today.  5. Anemia: Per Dr Marin Olp.  6. COPD: Continues home oxygen.   7. OSA: Suspected, patient has typical body habitus and daytime sleepiness.  I will order through pulmonary.  8. L MTP pain: Very possibly gout.  This has resolved.  Will check uric acid today and if pain recurs, would consider gout treatment.    Loralie Champagne 05/13/2015

## 2015-05-18 ENCOUNTER — Encounter: Payer: Self-pay | Admitting: Emergency Medicine

## 2015-05-18 ENCOUNTER — Ambulatory Visit: Payer: Medicare Other | Admitting: Emergency Medicine

## 2015-05-18 VITALS — BP 126/84 | HR 56 | Ht 65.0 in | Wt 295.0 lb

## 2015-05-18 DIAGNOSIS — J438 Other emphysema: Secondary | ICD-10-CM

## 2015-05-18 MED ORDER — BUDESONIDE-FORMOTEROL FUMARATE 160-4.5 MCG/ACT IN AERO: 2.0000 | INHALATION_SPRAY | Freq: Two times a day (BID) | RESPIRATORY_TRACT | Status: DC

## 2015-05-18 MED ORDER — TIOTROPIUM BROMIDE MONOHYDRATE 18 MCG IN CAPS
18.0000 ug | ORAL_CAPSULE | Freq: Every day | RESPIRATORY_TRACT | Status: DC
Start: 2015-05-18 — End: 2017-11-13

## 2015-05-18 NOTE — Progress Notes (Signed)
   Subjective:    Patient ID: Paige Hardin, female    DOB: 1946/10/04, 69 y.o.   MRN: [id number]  HPI OFFice Note [facility] 10/2014 The patient comes in today for follow-up of her known COPD with chronic respiratory failure. She also has known chronic congestive heart failure, and is monitoring her fluid balance very carefully. She has been compliant on her bronchodilator regimen and oxygen, and feels that she is near her usual baseline. She has not had an acute exacerbation or pulmonary infection according to her history since the last visit. She has been trying to exercise more regularly, and is even walking around her building.  [name] 05/18/15 -- Follow up visit for pt followed by Dr Gwenette Greet for COPD, chronic resp failure. She has had some congestion and hoarseness this spring. She is having dry cough, is not currently on allergy. She is stable on spiriva and symbicort, has some associated hoarseness. She goes to CHF clinic, has been adjusting diuretics. She believes that her breathing has been good, denies wheeze, has been on 2L/min. Uses SABA very rarely.    Review of Systems  Constitutional: Negative for fever and unexpected weight change.  HENT: Positive for congestion and postnasal drip. Negative for dental problem, ear pain, nosebleeds, rhinorrhea, sinus pressure, sneezing, sore throat and trouble swallowing.   Eyes: Negative for redness and itching.  Respiratory: Positive for cough and shortness of breath. Negative for chest tightness and wheezing.   Cardiovascular: Negative for palpitations and leg swelling.  Gastrointestinal: Negative for nausea and vomiting.  Genitourinary: Negative for dysuria.  Musculoskeletal: Negative for joint swelling.  Skin: Negative for rash.  Neurological: Negative for headaches.  Hematological: Does not bruise/bleed easily.  Psychiatric/Behavioral: Negative for dysphoric mood. The patient is not nervous/anxious.        Objective:   Physical Exam  Filed Vitals:     05/18/15 1426  BP: 126/84  Pulse: 56  Height: 5\' 5"  (1.651 m)  Weight: 295 lb (133.811 kg)  SpO2: 93%   Obese female in no acute distress Nose without purulence or discharge noted Neck without lymphadenopathy or thyromegaly Chest with mildly decreased breath sounds, no crackles or wheezes Cardiac exam with regular rate and rhythm Lower extremities with 1+ edema, no cyanosis Alert and oriented, moves all 4 extremities.      Assessment & Plan:  No problem-specific assessment & plan notes found for this encounter.

## 2015-05-18 NOTE — Patient Instructions (Signed)
Please continue your Spiriva and Symbicort  Use albuterol as needed for shortness of breath Wear your oxygen at all times.  Follow with Dr Lamonte Sakai in 6 months or sooner if you have any problems

## 2015-05-20 ENCOUNTER — Telehealth (HOSPITAL_COMMUNITY): Payer: Self-pay | Admitting: *Deleted

## 2015-05-20 MED ORDER — ALLOPURINOL 100 MG PO TABS: 100.0000 mg | ORAL_TABLET | Freq: Every day | ORAL | Status: DC

## 2015-05-20 NOTE — Telephone Encounter (Signed)
-----   Message from Larey Dresser, MD sent at 05/13/2015  9:55 PM EDT ----- Creatinine better.  Uric acid high, suspect gout.  Would start allopurinol 100 mg daily to prevent future gout attacks

## 2015-05-20 NOTE — Telephone Encounter (Signed)
Notes Recorded by Scarlette Calico, RN on 05/20/2015 at 2:13 PM Pt aware and agreeable, she is a resident at [facility], order faxed to them at [phone]

## 2015-06-08 DIAGNOSIS — E119 Type 2 diabetes mellitus without complications: Secondary | ICD-10-CM | POA: Diagnosis not present

## 2015-06-08 DIAGNOSIS — L84 Corns and callosities: Secondary | ICD-10-CM | POA: Diagnosis not present

## 2015-06-08 DIAGNOSIS — B351 Tinea unguium: Secondary | ICD-10-CM | POA: Diagnosis not present

## 2015-06-10 ENCOUNTER — Encounter (HOSPITAL_COMMUNITY): Payer: Self-pay

## 2015-06-10 ENCOUNTER — Ambulatory Visit (HOSPITAL_COMMUNITY)
Admission: RE | Admit: 2015-06-10 | Discharge: 2015-06-10 | Disposition: A | Discharge status: Home / Self Care | Payer: Medicare Other | Source: Ambulatory Visit | Attending: Cardiology | Admitting: Cardiology

## 2015-06-10 VITALS — BP 114/62 | HR 77 | Wt 297.8 lb

## 2015-06-10 DIAGNOSIS — I5032 Chronic diastolic (congestive) heart failure: Secondary | ICD-10-CM | POA: Diagnosis not present

## 2015-06-10 DIAGNOSIS — J449 Chronic obstructive pulmonary disease, unspecified: Secondary | ICD-10-CM | POA: Insufficient documentation

## 2015-06-10 DIAGNOSIS — Z79899 Other long term (current) drug therapy: Secondary | ICD-10-CM | POA: Insufficient documentation

## 2015-06-10 DIAGNOSIS — N183 Chronic kidney disease, stage 3 unspecified: Secondary | ICD-10-CM

## 2015-06-10 DIAGNOSIS — D649 Anemia, unspecified: Secondary | ICD-10-CM | POA: Diagnosis not present

## 2015-06-10 DIAGNOSIS — Z87891 Personal history of nicotine dependence: Secondary | ICD-10-CM | POA: Diagnosis not present

## 2015-06-10 DIAGNOSIS — I480 Paroxysmal atrial fibrillation: Secondary | ICD-10-CM

## 2015-06-10 DIAGNOSIS — E1122 Type 2 diabetes mellitus with diabetic chronic kidney disease: Secondary | ICD-10-CM | POA: Insufficient documentation

## 2015-06-10 DIAGNOSIS — M79672 Pain in left foot: Secondary | ICD-10-CM | POA: Diagnosis not present

## 2015-06-10 DIAGNOSIS — Z9981 Dependence on supplemental oxygen: Secondary | ICD-10-CM | POA: Insufficient documentation

## 2015-06-10 DIAGNOSIS — E785 Hyperlipidemia, unspecified: Secondary | ICD-10-CM | POA: Insufficient documentation

## 2015-06-10 DIAGNOSIS — Z794 Long term (current) use of insulin: Secondary | ICD-10-CM | POA: Diagnosis not present

## 2015-06-10 DIAGNOSIS — I129 Hypertensive chronic kidney disease with stage 1 through stage 4 chronic kidney disease, or unspecified chronic kidney disease: Secondary | ICD-10-CM | POA: Insufficient documentation

## 2015-06-10 LAB — BASIC METABOLIC PANEL
ANION GAP: 10 (ref 5–15)
BUN: 28 mg/dL — AB (ref 6–20)
CO2: 31 mmol/L (ref 22–32)
CREATININE: 1.36 mg/dL — AB (ref 0.44–1.00)
Calcium: 9.5 mg/dL (ref 8.9–10.3)
Chloride: 97 mmol/L — ABNORMAL LOW (ref 101–111)
GFR calc Af Amer: 45 mL/min — ABNORMAL LOW (ref >60)
GFR, EST NON AFRICAN AMERICAN: 38 mL/min — AB (ref >60)
GLUCOSE: 243 mg/dL — AB (ref 65–99)
Potassium: 4.4 mmol/L (ref 3.5–5.1)
Sodium: 138 mmol/L (ref 135–145)

## 2015-06-10 LAB — BRAIN NATRIURETIC PEPTIDE: B NATRIURETIC PEPTIDE 5: 180.3 pg/mL — AB (ref 0.0–100.0)

## 2015-06-10 NOTE — Progress Notes (Signed)
Patient ID: Paige Hardin, female   DOB: Feb 14, 1946, 69 y.o.   MRN: [id number] PCP: Dr. Tollie Pizza   69 yo with history of COPD on home oxygen, paroxysmal atrial fibrillation, and chronic diastolic CHF presents for followup. She was admitted for several weeks from 3/15 to 4/15 with atrial fibrillation/RVR, acute on chronic diastolic CHF, and COPD exacerbation.  She ended up being cardioverted in the hospital.  She was diuresed with IV Lasix.  After the prolonged hospitalization, she was sent to [facility].  She was again admitted at the end of 4/15 and hospitalized into 5/15 with acute on chronic diastolic CHF. She was intubated this time and went back into atrial fibrillation with RVR.  She was cardioverted back to NSR and is maintained on amiodarone.  She was again diuresed and discharged to SNF [facility]).    Patient returns for HF followup.  Weight is up 2 lbs since last visit.  Says her breathing is about the same. Has not walked much with foot pain, but better so wants to start back.  Continues to have occasional chest tightness, not related to exertion, most recent episode was sitting. Lasted for for a few minutes, but got better with stretching and deep breathing. No palpitations.  Continues to sleep in recliner with chronic orthopnea. She uses oxygen at all times.   She had an episode of left foot MTP pain that has resolved and has not had reoccurrence.  This was thought to be gout, and she was put on allopurinol. Continues to have fatigue and occasional daytime sleepiness.  Labs (4/15): K 4.4, creatinine 1.4 Labs (5/15): creatinine 1.3, hemoglobin 8.5 Labs (04/30/14 ): K 3.6, creatinine 1.6 Labs (05/05/14) : K 4.4, creatinine 1.42, TSH 3.69, pro-BNP 999, AST 15, ALT 14 Labs (06/18/14): K 4.1, creatinine 1.55, AST 19, ALT 19 Labs (4/16): K 4.3, creatinine 1.33, HCT 36 Labs (5/16): K 4.2, creatinine 2.09, TSH normal, LFTs normal Labs (6/16): K 4.4, creatinine 1.63, Uric acid 9.9 (elevated)  1.  Chronic low back pain.  2. COPD: On home oxygen. Prior smoker.  3. Type 2 diabetes.  4. Paroxysmal atrial fibrillation:  DCCV in 2010 and again in 4/15.  She is on warfarin and is on amiodarone to maintain NSR.  5. Hyperlipidemia.  6. Diastolic congestive heart failure: Echo (2/15) with EF 60%, mild MR, PA systolic pressure 53 mmHg. Echo (5/16) with mild LVH, EF 55-60%, mild MR, moderate biatrial enlargement.   7. Hypertension.  8. Low back pain 9. Obesity 10. CKD 11. Anemia: Fe deficiency 12. RLL nodule  13. ACEI cough 14. Gout  SOCIAL HISTORY: Prior smoker.  Patient is currently in SNF [facility]).  FAMILY HISTORY: Non-Hodgkin lymphoma and breast cancer. No early-onset  coronary artery disease.   REVIEW OF SYSTEMS: Negative except as noted in the history of present  Illness.  Current Outpatient Prescriptions  Medication Sig Dispense Refill  . acetaminophen (TYLENOL) 325 MG tablet Take 325 mg by mouth daily as needed for mild pain.     Marland Kitchen allopurinol (ZYLOPRIM) 100 MG tablet Take 1 tablet (100 mg total) by mouth daily.    Marland Kitchen amiodarone (PACERONE) 200 MG tablet Take 1 tablet (200 mg total) by mouth daily.    Marland Kitchen apixaban (ELIQUIS) 5 MG TABS tablet Take 1 tablet (5 mg total) by mouth 2 (two) times daily. 60 tablet   . Ascorbic Acid (VITAMIN C) 500 MG CAPS Take 500 mg by mouth daily.     . budesonide-formoterol (SYMBICORT) 160-4.5  MCG/ACT inhaler Inhale 2 puffs into the lungs 2 (two) times daily. 1 Inhaler 5  . calcium-vitamin D (OSCAL WITH D) 500-200 MG-UNIT per tablet Take 1 tablet by mouth daily with breakfast.    . Cholecalciferol (VITAMIN D) 2000 UNITS tablet Take 2,000 Units by mouth daily.    Marland Kitchen diltiazem (CARDIZEM CD) 180 MG 24 hr capsule Take 1 capsule (180 mg total) by mouth daily. 30 capsule 3  . ezetimibe-simvastatin (VYTORIN) 10-40 MG per tablet Take 1 tablet by mouth at bedtime.     . ferrous sulfate 325 (65 FE) MG tablet Take 325 mg by mouth 2 (two) times daily with a  meal.     . furosemide (LASIX) 80 MG tablet Take 2 tablets (160 mg total) by mouth 2 (two) times daily. MAKE SURE TO TAKE AT 8 AM AND 2 PM    . glimepiride (AMARYL) 2 MG tablet Take 2 mg by mouth daily with breakfast.     . insulin aspart (NOVOLOG) 100 UNIT/ML injection Inject 3 Units into the skin 2 (two) times daily at 10 am and 4 pm.    . Magnesium-Zinc 133.33-5 MG TABS Take 133 mg by mouth daily.    . metoprolol (LOPRESSOR) 50 MG tablet Take 1 tablet (50 mg total) by mouth 2 (two) times daily. 60 tablet 3  . Multiple Vitamins-Iron (MULTIVITAMIN/IRON PO) Take 1 tablet by mouth daily.    Marland Kitchen omeprazole (PRILOSEC) 20 MG capsule Take 20 mg by mouth daily.    . potassium chloride SA (K-DUR,KLOR-CON) 20 MEQ tablet Take 20 mEq by mouth 3 (three) times daily. MAKE SURE TO TAKE EXTRA 20 MEQ ON METOLAZONE DAYS    . PROAIR HFA 108 (90 BASE) MCG/ACT inhaler 1 puff every 4 (four) hours as needed.    Marland Kitchen SAFETY-LOK INSULIN SYR 1CC/29G 29G X 1/2" 1 ML MISC     . sitaGLIPtin (JANUVIA) 100 MG tablet Take 100 mg by mouth daily.    Marland Kitchen tiotropium (SPIRIVA) 18 MCG inhalation capsule Place 1 capsule (18 mcg total) into inhaler and inhale daily. 30 capsule 5  . vitamin B-12 (CYANOCOBALAMIN) 500 MCG tablet Take 500 mcg by mouth daily.     No current facility-administered medications for this encounter.    Filed Vitals:   06/10/15 1105  BP: 114/62  Pulse: 77  Weight: 297 lb 12 oz (135.059 kg)  SpO2: 95%    General:  NAD, on 2L oxygen by [place] Neck: Thick, JVD minimally elevated, no thyromegaly or thyroid nodule.  Lungs: Distant breath sounds bilaterally CV: Nondisplaced PMI.  Heart regular S1/S2, no S3/S4, 1/6 early SEM RUSB. Trace ankle edema.  No carotid bruit.   Abdomen: Soft, nontender, no hepatosplenomegaly, no distention.  Skin: Intact without lesions or rashes.  Neurologic: Alert and oriented x 3.  Psych: Normal affect. Extremities: No clubbing or cyanosis.   Assessment/Plan:  1. Atrial fibrillation:  Paroxysmal. Pulse regular today. - Continue amiodarone 200 mg daily and Eliquis 5 mg BID.  Recent LFTs and TSH normal.  Will need yearly eye exam.  She was evaluated by EP in hospital during her last admission, recommended against AV nodal ablation/pacing.  2. Chronic diastolic CHF: EF 79-39% (0/30).  Volume looks stable today.   - Did not tolerate Metolazone (worsening kidney function) - Continue lasix 160 mg BID, will check BMET/BNP today.    - Would likely benefit from Cardiomems device but unfortunately does not qualify given no hospitalization in the last year.  - Encouraged to restart  walking as able. - Reinforced the need and importance of daily weights, a low sodium diet, and fluid restriction (less than 2 L a day). Instructed to call the HF clinic if weight increases more than 3 lbs overnight or 5 lbs in a week.  4. CKD stage III: Rise in creatinine when getting metolazone.  Improved on follow up BMET, will continue to follow. 5. Anemia: Per Dr Marin Olp.  6. COPD: Continues home oxygen.   7. OSA: Suspected, patient has typical body habitus and daytime sleepiness.   - Will schedule sleep study 8. L MTP pain: Says she is better, but not sure if nursing home got prescription for allopurinol or not.  9. Atypical CP:  Without trigger.  Unsure if related to meals, but feels as if it may be indigestion.   Shirley Friar PA-C 06/10/2015   Patient seen with PA, agree with the above.  She is clinically stable.  Continue current Lasix.  Check BMET/BNP today.  She will be scheduled for a sleep study.  She remains in NSR.  Followup in 1 month.   Loralie Champagne 06/10/2015

## 2015-06-10 NOTE — Patient Instructions (Signed)
YOUR PROVIDER HAS REQUESTED YOU BE REFERRED FOR A SLEEP STUDY EVALUATION.  LABS today (bmet bnp)  FOLLOW UP in 1 MONTH.

## 2015-07-13 ENCOUNTER — Ambulatory Visit (HOSPITAL_COMMUNITY)
Admission: RE | Admit: 2015-07-13 | Discharge: 2015-07-13 | Disposition: A | Discharge status: Home / Self Care | Payer: Medicare Other | Source: Ambulatory Visit | Attending: Internal Medicine | Admitting: Internal Medicine

## 2015-07-13 VITALS — BP 142/74 | HR 66 | Wt 298.4 lb

## 2015-07-13 DIAGNOSIS — J449 Chronic obstructive pulmonary disease, unspecified: Secondary | ICD-10-CM | POA: Diagnosis not present

## 2015-07-13 DIAGNOSIS — Z9981 Dependence on supplemental oxygen: Secondary | ICD-10-CM | POA: Insufficient documentation

## 2015-07-13 DIAGNOSIS — D649 Anemia, unspecified: Secondary | ICD-10-CM | POA: Insufficient documentation

## 2015-07-13 DIAGNOSIS — Z79899 Other long term (current) drug therapy: Secondary | ICD-10-CM | POA: Insufficient documentation

## 2015-07-13 DIAGNOSIS — I129 Hypertensive chronic kidney disease with stage 1 through stage 4 chronic kidney disease, or unspecified chronic kidney disease: Secondary | ICD-10-CM | POA: Diagnosis not present

## 2015-07-13 DIAGNOSIS — E1122 Type 2 diabetes mellitus with diabetic chronic kidney disease: Secondary | ICD-10-CM | POA: Insufficient documentation

## 2015-07-13 DIAGNOSIS — I480 Paroxysmal atrial fibrillation: Secondary | ICD-10-CM | POA: Insufficient documentation

## 2015-07-13 DIAGNOSIS — E1165 Type 2 diabetes mellitus with hyperglycemia: Secondary | ICD-10-CM

## 2015-07-13 DIAGNOSIS — M109 Gout, unspecified: Secondary | ICD-10-CM | POA: Diagnosis not present

## 2015-07-13 DIAGNOSIS — Z87891 Personal history of nicotine dependence: Secondary | ICD-10-CM | POA: Insufficient documentation

## 2015-07-13 DIAGNOSIS — E785 Hyperlipidemia, unspecified: Secondary | ICD-10-CM | POA: Insufficient documentation

## 2015-07-13 DIAGNOSIS — N183 Chronic kidney disease, stage 3 (moderate): Secondary | ICD-10-CM | POA: Diagnosis not present

## 2015-07-13 DIAGNOSIS — Z794 Long term (current) use of insulin: Secondary | ICD-10-CM | POA: Diagnosis not present

## 2015-07-13 DIAGNOSIS — I5032 Chronic diastolic (congestive) heart failure: Secondary | ICD-10-CM | POA: Insufficient documentation

## 2015-07-13 LAB — COMPREHENSIVE METABOLIC PANEL
ALBUMIN: 3.5 g/dL (ref 3.5–5.0)
ALK PHOS: 89 U/L (ref 38–126)
ALT: 20 U/L (ref 14–54)
AST: 23 U/L (ref 15–41)
Anion gap: 11 (ref 5–15)
BUN: 24 mg/dL — ABNORMAL HIGH (ref 6–20)
CALCIUM: 9.4 mg/dL (ref 8.9–10.3)
CO2: 31 mmol/L (ref 22–32)
Chloride: 96 mmol/L — ABNORMAL LOW (ref 101–111)
Creatinine, Ser: 1.35 mg/dL — ABNORMAL HIGH (ref 0.44–1.00)
GFR calc Af Amer: 45 mL/min — ABNORMAL LOW (ref >60)
GFR calc non Af Amer: 39 mL/min — ABNORMAL LOW (ref >60)
GLUCOSE: 304 mg/dL — AB (ref 65–99)
POTASSIUM: 4.2 mmol/L (ref 3.5–5.1)
SODIUM: 138 mmol/L (ref 135–145)
Total Bilirubin: 0.5 mg/dL (ref 0.3–1.2)
Total Protein: 6.7 g/dL (ref 6.5–8.1)

## 2015-07-13 LAB — TSH: TSH: 3.412 u[IU]/mL (ref 0.350–4.500)

## 2015-07-13 MED ORDER — METOLAZONE 2.5 MG PO TABS: ORAL_TABLET | ORAL | Status: DC

## 2015-07-13 NOTE — Addendum Note (Signed)
Encounter addended by: Patton Salles, RN on: 07/13/2015 11:11 AM<BR>     Documentation filed: Visit Diagnoses, Patient Instructions Section, Dx Association, Orders

## 2015-07-13 NOTE — Patient Instructions (Signed)
Labs today. Continue current plan.  If weight is greater than or equal to 300 lbs take a metolazone 2.5mg  1 tablet, and take an extra Klor-Con 20 meg 1 tablet.  Follow up in 2 months.

## 2015-07-13 NOTE — Progress Notes (Signed)
Patient ID: Paige Hardin, female   DOB: [birth year], 69 y.o.   MRN: [id number] PCP: Dr. Tollie Pizza Pulmonologist: Dr. Gwenette Greet HF Cardiologist: Aundra Dubin   69 yo with history of morbid obesity, COPD on home oxygen, paroxysmal atrial fibrillation, and chronic diastolic CHF presents for followup. She was admitted for several weeks from 3/15 to 4/15 with atrial fibrillation/RVR, acute on chronic diastolic CHF, and COPD exacerbation.  She ended up being cardioverted in the hospital.  She was diuresed with IV Lasix.  After the prolonged hospitalization, she was sent to [facility].  She was again admitted at the end of 4/15 and hospitalized into 5/15 with acute on chronic diastolic CHF. She was intubated this time and went back into atrial fibrillation with RVR.  She was cardioverted back to NSR and is maintained on amiodarone.  She was again diuresed and discharged to SNF [facility]).    Patient returns for followup today.  At last appointment, volume looked good. Her metolazone stopped due to increase in creatinine to 2.09 after one dose. She remains at [facility]. She weights herself every day. Goes up and down based on her dietary intake. Weight up a pound here. She does her best to watch her salt. She walks with a walker. Short of breath with mild exertion.  No chest pain.  No palpitations.  She is in NSR today. She has chronic orthopnea and has slept in a recliner for a long time. She continues to use oxygen at all times. + mild edema.   Labs (4/15): K 4.4, creatinine 1.4 Labs (5/15): creatinine 1.3, hemoglobin 8.5 Labs (04/30/14 ): K 3.6, creatinine 1.6 Labs (05/05/14) : K 4.4, creatinine 1.42, TSH 3.69, pro-BNP 999, AST 15, ALT 14 Labs (06/18/14): K 4.1, creatinine 1.55, AST 19, ALT 19 Labs (4/16): K 4.3, creatinine 1.33, HCT 36 Labs (5/16): K 4.2, creatinine 2.09, TSH normal, LFTs normal  1. Chronic low back pain.  2. COPD: On home oxygen. Prior smoker.  3. Type 2 diabetes.  4. Paroxysmal atrial  fibrillation:  DCCV in 2010 and again in 4/15.  She is on warfarin and is on amiodarone to maintain NSR.  5. Hyperlipidemia.  6. Diastolic congestive heart failure: Echo (2/15) with EF 60%, mild MR, PA systolic pressure 53 mmHg. Echo (5/16) with mild LVH, EF 55-60%, mild MR, moderate biatrial enlargement.   7. Hypertension.  8. Low back pain 9. Obesity 10. CKD 11. Anemia: Fe deficiency 12. RLL nodule  13. ACEI cough 14. Suspected gout  SOCIAL HISTORY: Prior smoker.  Patient is currently in SNF [facility]).  FAMILY HISTORY: Non-Hodgkin lymphoma and breast cancer. No early-onset  coronary artery disease.   REVIEW OF SYSTEMS: Negative except as noted in the history of present  Illness.  Current Outpatient Prescriptions  Medication Sig Dispense Refill  . acetaminophen (TYLENOL) 325 MG tablet Take 325 mg by mouth daily as needed for mild pain.     Marland Kitchen allopurinol (ZYLOPRIM) 100 MG tablet Take 1 tablet (100 mg total) by mouth daily.    Marland Kitchen amiodarone (PACERONE) 200 MG tablet Take 1 tablet (200 mg total) by mouth daily.    Marland Kitchen apixaban (ELIQUIS) 5 MG TABS tablet Take 1 tablet (5 mg total) by mouth 2 (two) times daily. 60 tablet   . Ascorbic Acid (VITAMIN C) 500 MG CAPS Take 500 mg by mouth daily.     . budesonide-formoterol (SYMBICORT) 160-4.5 MCG/ACT inhaler Inhale 2 puffs into the lungs 2 (two) times daily. 1 Inhaler 5  .  calcium-vitamin D (OSCAL WITH D) 500-200 MG-UNIT per tablet Take 1 tablet by mouth daily with breakfast.    . Cholecalciferol (VITAMIN D) 2000 UNITS tablet Take 2,000 Units by mouth daily.    Marland Kitchen diltiazem (CARDIZEM CD) 180 MG 24 hr capsule Take 1 capsule (180 mg total) by mouth daily. 30 capsule 3  . ezetimibe-simvastatin (VYTORIN) 10-40 MG per tablet Take 1 tablet by mouth at bedtime.     . ferrous sulfate 325 (65 FE) MG tablet Take 325 mg by mouth 2 (two) times daily with a meal.     . furosemide (LASIX) 80 MG tablet Take 2 tablets (160 mg total) by mouth 2 (two) times  daily. MAKE SURE TO TAKE AT 8 AM AND 2 PM    . glimepiride (AMARYL) 2 MG tablet Take 2 mg by mouth daily with breakfast.     . insulin aspart (NOVOLOG) 100 UNIT/ML injection Inject 3 Units into the skin 2 (two) times daily at 10 am and 4 pm.    . Magnesium-Zinc 133.33-5 MG TABS Take 133 mg by mouth daily.    . metoprolol (LOPRESSOR) 50 MG tablet Take 1 tablet (50 mg total) by mouth 2 (two) times daily. 60 tablet 3  . Multiple Vitamins-Iron (MULTIVITAMIN/IRON PO) Take 1 tablet by mouth daily.    Marland Kitchen omeprazole (PRILOSEC) 20 MG capsule Take 20 mg by mouth daily.    . potassium chloride SA (K-DUR,KLOR-CON) 20 MEQ tablet Take 20 mEq by mouth 3 (three) times daily. MAKE SURE TO TAKE EXTRA 20 MEQ ON METOLAZONE DAYS    . PROAIR HFA 108 (90 BASE) MCG/ACT inhaler 1 puff every 4 (four) hours as needed.    Marland Kitchen SAFETY-LOK INSULIN SYR 1CC/29G 29G X 1/2" 1 ML MISC     . sitaGLIPtin (JANUVIA) 100 MG tablet Take 100 mg by mouth daily.    Marland Kitchen tiotropium (SPIRIVA) 18 MCG inhalation capsule Place 1 capsule (18 mcg total) into inhaler and inhale daily. 30 capsule 5  . vitamin B-12 (CYANOCOBALAMIN) 500 MCG tablet Take 500 mcg by mouth daily.     No current facility-administered medications for this encounter.    Filed Vitals:   07/13/15 1030  BP: 142/74  Pulse: 66  Weight: 298 lb 6.4 oz (135.353 kg)  SpO2: 95%    General:  NAD, on 2L oxygen by [place] Neck: Thick, JVP 5, no thyromegaly or thyroid nodule.  Lungs: Distant breath sounds bilaterally CV: Nondisplaced PMI.  Heart regular S1/S2, no S3/S4, 1/6 early SEM RUSB. Trace ankle edema.  No carotid bruit.   Abdomen: Soft, nontender, no hepatosplenomegaly, no distention.  Skin: Intact without lesions or rashes.  Neurologic: Alert and oriented x 3.  Psych: Normal affect. Extremities: No clubbing or cyanosis.   Assessment/Plan:  1. Atrial fibrillation: Paroxysmal. She is in NSR today. Will continue amiodarone 200 mg daily and Eliquis 5 mg twice a day.  Recent LFTs  and TSH normal.  Will need yearly eye exam.  She was evaluated by EP in hospital during her last admission, recommended against AV nodal ablation/pacing.  2. Chronic diastolic CHF: EF 03-50% (0/93).  Volume looks stable today.  She is no longer taking metolazone due to elevated creatinine.  Can consider switching to demadex in future, if needed. - Continue lasix 160 mg BID, will recheck BMET/BNP today.  Would take metolazone 2.5mg  kcl 20 x 1 if weight goes to 300 pounds. - She would likely benefit from Cardiomems device but unfortunately does not qualify given no  hospitalization in the last year.  - Reinforced the need and importance of daily weights, a low sodium diet, and fluid restriction (less than 2 L a day). Instructed to call the HF clinic if weight increases more than 3 lbs overnight or 5 lbs in a week.  4. CKD stage III: Stable after stopping metolazone.  5. Anemia: Per Dr Marin Olp.  6. COPD: Continues home oxygen.   7. OSA: Suspected, patient has typical body habitus and daytime sleepiness.  Sleep study pending.  8. Gout -  quiescent   Glori Bickers MD 07/13/2015

## 2015-07-14 LAB — HEMOGLOBIN A1C
Hgb A1c MFr Bld: 8 % — ABNORMAL HIGH (ref 4.8–5.6)
Mean Plasma Glucose: 183 mg/dL

## 2015-07-14 LAB — T4: T4 TOTAL: 14.8 ug/dL — AB (ref 4.5–12.0)

## 2015-07-15 ENCOUNTER — Telehealth (HOSPITAL_COMMUNITY): Payer: Self-pay

## 2015-07-15 NOTE — Telephone Encounter (Signed)
error 

## 2015-08-04 DIAGNOSIS — E119 Type 2 diabetes mellitus without complications: Secondary | ICD-10-CM | POA: Diagnosis not present

## 2015-08-04 DIAGNOSIS — Z6841 Body Mass Index (BMI) 40.0 and over, adult: Secondary | ICD-10-CM | POA: Diagnosis not present

## 2015-08-30 ENCOUNTER — Telehealth: Payer: Self-pay | Admitting: Emergency Medicine

## 2015-08-30 NOTE — Telephone Encounter (Signed)
Called and spoke to pt. Pt is requesting a new neb machine. Informed pt we do not have a neb med on her med list nor was it mentioned at last OV. Pt stated she will speak with her nurse and find out what neb med she uses to clear it with Dr. Lamonte Sakai. Will await call.

## 2015-08-30 NOTE — Telephone Encounter (Signed)
Paige Hardin from [employer] is faxing paperwork to [facility] attention.

## 2015-08-31 NOTE — Telephone Encounter (Signed)
I have not yet received the forms yet.

## 2015-08-31 NOTE — Telephone Encounter (Signed)
Paige Hardin did you receive this paperwork?

## 2015-09-02 ENCOUNTER — Ambulatory Visit (HOSPITAL_COMMUNITY)
Admission: RE | Admit: 2015-09-02 | Discharge: 2015-09-02 | Disposition: A | Discharge status: Home / Self Care | Payer: Medicare Other | Source: Ambulatory Visit | Attending: Cardiology | Admitting: Cardiology

## 2015-09-02 VITALS — BP 142/90 | HR 66 | Wt 302.0 lb

## 2015-09-02 DIAGNOSIS — Z9981 Dependence on supplemental oxygen: Secondary | ICD-10-CM | POA: Insufficient documentation

## 2015-09-02 DIAGNOSIS — I129 Hypertensive chronic kidney disease with stage 1 through stage 4 chronic kidney disease, or unspecified chronic kidney disease: Secondary | ICD-10-CM | POA: Insufficient documentation

## 2015-09-02 DIAGNOSIS — Z87891 Personal history of nicotine dependence: Secondary | ICD-10-CM | POA: Diagnosis not present

## 2015-09-02 DIAGNOSIS — R05 Cough: Secondary | ICD-10-CM | POA: Diagnosis not present

## 2015-09-02 DIAGNOSIS — D649 Anemia, unspecified: Secondary | ICD-10-CM | POA: Diagnosis not present

## 2015-09-02 DIAGNOSIS — R0602 Shortness of breath: Secondary | ICD-10-CM | POA: Diagnosis not present

## 2015-09-02 DIAGNOSIS — J449 Chronic obstructive pulmonary disease, unspecified: Secondary | ICD-10-CM | POA: Diagnosis not present

## 2015-09-02 DIAGNOSIS — I5032 Chronic diastolic (congestive) heart failure: Secondary | ICD-10-CM | POA: Insufficient documentation

## 2015-09-02 DIAGNOSIS — I4891 Unspecified atrial fibrillation: Secondary | ICD-10-CM | POA: Diagnosis not present

## 2015-09-02 DIAGNOSIS — E1122 Type 2 diabetes mellitus with diabetic chronic kidney disease: Secondary | ICD-10-CM | POA: Diagnosis not present

## 2015-09-02 DIAGNOSIS — Z794 Long term (current) use of insulin: Secondary | ICD-10-CM | POA: Diagnosis not present

## 2015-09-02 DIAGNOSIS — Z7902 Long term (current) use of antithrombotics/antiplatelets: Secondary | ICD-10-CM | POA: Diagnosis not present

## 2015-09-02 DIAGNOSIS — N183 Chronic kidney disease, stage 3 unspecified: Secondary | ICD-10-CM

## 2015-09-02 DIAGNOSIS — E785 Hyperlipidemia, unspecified: Secondary | ICD-10-CM | POA: Insufficient documentation

## 2015-09-02 DIAGNOSIS — Z7984 Long term (current) use of oral hypoglycemic drugs: Secondary | ICD-10-CM | POA: Insufficient documentation

## 2015-09-02 DIAGNOSIS — I480 Paroxysmal atrial fibrillation: Secondary | ICD-10-CM | POA: Insufficient documentation

## 2015-09-02 DIAGNOSIS — Z79899 Other long term (current) drug therapy: Secondary | ICD-10-CM | POA: Insufficient documentation

## 2015-09-02 LAB — COMPREHENSIVE METABOLIC PANEL
ALBUMIN: 3.5 g/dL (ref 3.5–5.0)
ALK PHOS: 83 U/L (ref 38–126)
ALT: 23 U/L (ref 14–54)
ANION GAP: 9 (ref 5–15)
AST: 24 U/L (ref 15–41)
BUN: 35 mg/dL — ABNORMAL HIGH (ref 6–20)
CO2: 32 mmol/L (ref 22–32)
Calcium: 9.6 mg/dL (ref 8.9–10.3)
Chloride: 96 mmol/L — ABNORMAL LOW (ref 101–111)
Creatinine, Ser: 1.63 mg/dL — ABNORMAL HIGH (ref 0.44–1.00)
GFR calc Af Amer: 36 mL/min — ABNORMAL LOW (ref >60)
GFR calc non Af Amer: 31 mL/min — ABNORMAL LOW (ref >60)
GLUCOSE: 254 mg/dL — AB (ref 65–99)
Potassium: 4.3 mmol/L (ref 3.5–5.1)
SODIUM: 137 mmol/L (ref 135–145)
Total Bilirubin: 0.5 mg/dL (ref 0.3–1.2)
Total Protein: 6.7 g/dL (ref 6.5–8.1)

## 2015-09-02 LAB — CBC WITH DIFFERENTIAL/PLATELET
Basophils Absolute: 0 10*3/uL (ref 0.0–0.1)
Basophils Relative: 0 %
Eosinophils Absolute: 0.2 10*3/uL (ref 0.0–0.7)
Eosinophils Relative: 2 %
HCT: 35.9 % — ABNORMAL LOW (ref 36.0–46.0)
Hemoglobin: 11.4 g/dL — ABNORMAL LOW (ref 12.0–15.0)
LYMPHS PCT: 12 %
Lymphs Abs: 1.2 10*3/uL (ref 0.7–4.0)
MCH: 30.2 pg (ref 26.0–34.0)
MCHC: 31.8 g/dL (ref 30.0–36.0)
MCV: 95 fL (ref 78.0–100.0)
Monocytes Absolute: 0.5 10*3/uL (ref 0.1–1.0)
Monocytes Relative: 6 %
Neutro Abs: 7.5 10*3/uL (ref 1.7–7.7)
Neutrophils Relative %: 80 %
Platelets: 244 10*3/uL (ref 150–400)
RBC: 3.78 MIL/uL — AB (ref 3.87–5.11)
RDW: 15.2 % (ref 11.5–15.5)
WBC: 9.3 10*3/uL (ref 4.0–10.5)

## 2015-09-02 LAB — TSH: TSH: 3.594 u[IU]/mL (ref 0.350–4.500)

## 2015-09-02 LAB — BRAIN NATRIURETIC PEPTIDE: B Natriuretic Peptide: 225 pg/mL — ABNORMAL HIGH (ref 0.0–100.0)

## 2015-09-02 NOTE — Patient Instructions (Signed)
Please use Zaroxlyn and potasium as ordered when her weight is 300 lbs or greater  Chest x ray and EKG today  Blood work today: Cmet, TSH, BNP, CBC  Return in 3 months for follow up

## 2015-09-02 NOTE — Progress Notes (Signed)
C/ohaving head congestion sinus drainage with congested cough for 2 weeks

## 2015-09-02 NOTE — Telephone Encounter (Signed)
lmtcb for [employer] nurse line to have med list refaxed.

## 2015-09-03 ENCOUNTER — Ambulatory Visit (HOSPITAL_BASED_OUTPATIENT_CLINIC_OR_DEPARTMENT_OTHER): Payer: Medicare Other | Attending: Cardiology

## 2015-09-03 VITALS — Ht 69.0 in | Wt 295.0 lb

## 2015-09-03 DIAGNOSIS — Z79899 Other long term (current) drug therapy: Secondary | ICD-10-CM | POA: Diagnosis not present

## 2015-09-03 DIAGNOSIS — R5383 Other fatigue: Secondary | ICD-10-CM | POA: Insufficient documentation

## 2015-09-03 DIAGNOSIS — E119 Type 2 diabetes mellitus without complications: Secondary | ICD-10-CM | POA: Insufficient documentation

## 2015-09-03 DIAGNOSIS — Z6841 Body Mass Index (BMI) 40.0 and over, adult: Secondary | ICD-10-CM | POA: Insufficient documentation

## 2015-09-03 DIAGNOSIS — R0683 Snoring: Secondary | ICD-10-CM | POA: Insufficient documentation

## 2015-09-03 DIAGNOSIS — E669 Obesity, unspecified: Secondary | ICD-10-CM | POA: Diagnosis not present

## 2015-09-03 DIAGNOSIS — G4719 Other hypersomnia: Secondary | ICD-10-CM | POA: Diagnosis not present

## 2015-09-03 DIAGNOSIS — I5032 Chronic diastolic (congestive) heart failure: Secondary | ICD-10-CM

## 2015-09-03 DIAGNOSIS — I491 Atrial premature depolarization: Secondary | ICD-10-CM | POA: Diagnosis not present

## 2015-09-03 NOTE — Telephone Encounter (Signed)
Called and spoke to pt. Pt states she still needs her neb refill. [place] at [phone] and spoke to pt's nurse, Levada Dy. Levada Dy states the medication is Albuterol neb 0.083% 1 vial q6h prn. Levada Dy states they typically refill the pt's medication unless the med isn't covered. Levada Dy states she will take care of the refill and call back if needed. Nothing further needed at this time.

## 2015-09-05 NOTE — Progress Notes (Signed)
Patient ID: Paige Hardin, female   DOB: 28-Aug-1946, 69 y.o.   MRN: [id number] PCP: Dr. Tollie Pizza Pulmonologist: Dr. Gwenette Greet HF Cardiologist: Aundra Dubin   69 yo with history of morbid obesity, COPD on home oxygen, paroxysmal atrial fibrillation, and chronic diastolic CHF presents for followup. She was admitted for several weeks from 3/15 to 4/15 with atrial fibrillation/RVR, acute on chronic diastolic CHF, and COPD exacerbation.  She ended up being cardioverted in the hospital.  She was diuresed with IV Lasix.  After the prolonged hospitalization, she was sent to [facility].  She was again admitted at the end of 4/15 and hospitalized into 5/15 with acute on chronic diastolic CHF. She was intubated this time and went back into atrial fibrillation with RVR.  She was cardioverted back to NSR and is maintained on amiodarone.  She was again diuresed and discharged to SNF [facility]).    Patient returns for followup today.  She remains at [facility]. She weights herself every day.  She does her best to watch her salt. She walks with a walker. No chest pain.  No palpitations.  She is in NSR today. She has chronic orthopnea and has slept in a recliner for a long time. She continues to use oxygen at all times. She has chronic lower leg edema but has been unable to wear compression stockings.  Weight has been stable at home, she takes metolazone only when weight is > 300 lbs.  Breathing is stable, dyspnea after walking 100-200 feet.  She has had upper respiratory congestion/rhinorrhea/cough for about 2 wks.  This is improving.  PCP has wanted her to have a CXR.    ECG: NSR, Q in III  Labs (4/15): K 4.4, creatinine 1.4 Labs (5/15): creatinine 1.3, hemoglobin 8.5 Labs (04/30/14 ): K 3.6, creatinine 1.6 Labs (05/05/14) : K 4.4, creatinine 1.42, TSH 3.69, pro-BNP 999, AST 15, ALT 14 Labs (06/18/14): K 4.1, creatinine 1.55, AST 19, ALT 19 Labs (4/16): K 4.3, creatinine 1.33, HCT 36 Labs (5/16): K 4.2, creatinine 2.09,  TSH normal, LFTs normal Labs (8/16): K 4.2, creatinine 1.35, TSH normal  1. Chronic low back pain.  2. COPD: On home oxygen. Prior smoker.  3. Type 2 diabetes.  4. Paroxysmal atrial fibrillation:  DCCV in 2010 and again in 4/15.  She is on warfarin and is on amiodarone to maintain NSR.  5. Hyperlipidemia.  6. Diastolic congestive heart failure: Echo (2/15) with EF 60%, mild MR, PA systolic pressure 53 mmHg. Echo (5/16) with mild LVH, EF 55-60%, mild MR, moderate biatrial enlargement.   7. Hypertension.  8. Low back pain 9. Obesity 10. CKD 11. Anemia: Fe deficiency 12. RLL nodule  13. ACEI cough 14. Suspected gout  SOCIAL HISTORY: Prior smoker.  Patient is currently in SNF [facility]).  FAMILY HISTORY: Non-Hodgkin lymphoma and breast cancer. No early-onset  coronary artery disease.   REVIEW OF SYSTEMS: Negative except as noted in the history of present  Illness.  Current Outpatient Prescriptions  Medication Sig Dispense Refill  . allopurinol (ZYLOPRIM) 100 MG tablet Take 1 tablet (100 mg total) by mouth daily.    Marland Kitchen amiodarone (PACERONE) 200 MG tablet Take 1 tablet (200 mg total) by mouth daily.    Marland Kitchen apixaban (ELIQUIS) 5 MG TABS tablet Take 1 tablet (5 mg total) by mouth 2 (two) times daily. 60 tablet   . Ascorbic Acid (VITAMIN C) 500 MG CAPS Take 500 mg by mouth daily.     . budesonide-formoterol (SYMBICORT) 160-4.5 MCG/ACT  inhaler Inhale 2 puffs into the lungs 2 (two) times daily. 1 Inhaler 5  . calcium-vitamin D (OSCAL WITH D) 500-200 MG-UNIT per tablet Take 1 tablet by mouth daily with breakfast.    . Cholecalciferol (VITAMIN D) 2000 UNITS tablet Take 2,000 Units by mouth daily.    Marland Kitchen diltiazem (CARDIZEM CD) 180 MG 24 hr capsule Take 1 capsule (180 mg total) by mouth daily. 30 capsule 3  . ezetimibe-simvastatin (VYTORIN) 10-40 MG per tablet Take 1 tablet by mouth at bedtime.     . ferrous sulfate 325 (65 FE) MG tablet Take 325 mg by mouth 2 (two) times daily with a meal.     .  furosemide (LASIX) 80 MG tablet Take 2 tablets (160 mg total) by mouth 2 (two) times daily. MAKE SURE TO TAKE AT 8 AM AND 2 PM    . glimepiride (AMARYL) 2 MG tablet Take 2 mg by mouth daily with breakfast.     . insulin aspart (NOVOLOG) 100 UNIT/ML injection Inject 3 Units into the skin 2 (two) times daily at 10 am and 4 pm.    . Magnesium-Zinc 133.33-5 MG TABS Take 133 mg by mouth daily.    . metoprolol (LOPRESSOR) 50 MG tablet Take 1 tablet (50 mg total) by mouth 2 (two) times daily. 60 tablet 3  . Multiple Vitamins-Iron (MULTIVITAMIN/IRON PO) Take 1 tablet by mouth daily.    Marland Kitchen omeprazole (PRILOSEC) 20 MG capsule Take 20 mg by mouth daily.    . potassium chloride SA (K-DUR,KLOR-CON) 20 MEQ tablet Take 20 mEq by mouth 3 (three) times daily. MAKE SURE TO TAKE EXTRA 20 MEQ ON METOLAZONE DAYS    . PROAIR HFA 108 (90 BASE) MCG/ACT inhaler 1 puff every 4 (four) hours as needed.    Marland Kitchen SAFETY-LOK INSULIN SYR 1CC/29G 29G X 1/2" 1 ML MISC     . sitaGLIPtin (JANUVIA) 100 MG tablet Take 100 mg by mouth daily.    Marland Kitchen tiotropium (SPIRIVA) 18 MCG inhalation capsule Place 1 capsule (18 mcg total) into inhaler and inhale daily. 30 capsule 5  . vitamin B-12 (CYANOCOBALAMIN) 500 MCG tablet Take 500 mcg by mouth daily.    Marland Kitchen acetaminophen (TYLENOL) 325 MG tablet Take 325 mg by mouth daily as needed for mild pain.     . metolazone (ZAROXOLYN) 2.5 MG tablet Take 1 tablet only if weight is greater or equal to 300 lbs as needed 30 tablet 3   No current facility-administered medications for this encounter.    Filed Vitals:   09/02/15 1118  BP: 142/90  Pulse: 66  Weight: 302 lb (136.986 kg)  SpO2: 91%    General:  NAD, on 2L oxygen by [place] Neck: Thick, JVP 7, no thyromegaly or thyroid nodule.  Lungs: Distant breath sounds bilaterally CV: Nondisplaced PMI.  Heart regular S1/S2, no S3/S4, 1/6 early SEM RUSB. 1+ ankle edema.  No carotid bruit.   Abdomen: Soft, nontender, no hepatosplenomegaly, no distention.  Skin:  Intact without lesions or rashes.  Neurologic: Alert and oriented x 3.  Psych: Normal affect. Extremities: No clubbing or cyanosis.   Assessment/Plan:  1. Atrial fibrillation: Paroxysmal. She is in NSR today. Will continue amiodarone 200 mg daily and Eliquis 5 mg twice a day.  Check LFTs and TSH today.  Will need yearly eye exam.  She was evaluated by EP in hospital during her last admission, recommended against AV nodal ablation/pacing. Check CBC given anticoagulation.  2. Chronic diastolic CHF: EF 10-93% (echo 5/16).  Volume looks stable today.   - Continue lasix 160 mg BID, will recheck BMET/BNP today.   - Would take metolazone 2.5 mg x 1 + kcl 20 x 1 if weight goes to 300 pounds.  She would likely benefit from Cardiomems device but does not qualify given no hospitalization in the last year.  - Reinforced the need and importance of daily weights, a low sodium diet, and fluid restriction (less than 2 L a day). Instructed to call the HF clinic if weight increases more than 3 lbs overnight or 5 lbs in a week.  4. CKD stage III: Stable after stopping standing metolazone. Recheck BMET today.  5. Anemia: Per Dr Marin Olp.  6. COPD: Continues home oxygen.   7. OSA: Suspected, patient has typical body habitus and daytime sleepiness.  Sleep study to be done tomorrow. 8. Gout:  Quiescent 9. URI symptoms: URI symptoms x 2 weeks.  I will arrange for a CXR.    Followup in 3 months.    Loralie Champagne MD 09/05/2015

## 2015-09-08 ENCOUNTER — Encounter (HOSPITAL_BASED_OUTPATIENT_CLINIC_OR_DEPARTMENT_OTHER): Payer: Self-pay

## 2015-09-08 ENCOUNTER — Telehealth: Payer: Self-pay | Admitting: Cardiology

## 2015-09-08 DIAGNOSIS — G4719 Other hypersomnia: Secondary | ICD-10-CM

## 2015-09-08 HISTORY — DX: Other hypersomnia: G47.19

## 2015-09-08 NOTE — Telephone Encounter (Signed)
Please let patient know that sleep study showed no significant sleep apnea.    

## 2015-09-08 NOTE — Sleep Study (Signed)
   Patient Name: Paige Hardin, [place] MRN: [id number] Study Date: 09/03/2015 Gender: Female D.O.B: [birth year] Age (years): 88 Referring Provider: Loralie Champagne Interpreting Physician: Fransico Him MD, ABSM RPSGT: [name] Flattery  Height (inches): 69 Weight (lbs): 295 BMI: 44 Neck Size: 16.00  CLINICAL INFORMATION  Sleep Study Type: NPSG Indication for sleep study: Diabetes, Excessive Daytime Sleepiness, Fatigue, Obesity, Snoring, Witnessed Apneas Epworth Sleepiness Score: 8  SLEEP STUDY TECHNIQUE As per the AASM Manual for the Scoring of Sleep and Associated Events v2.3 (April 2016) with a hypopnea requiring 4% desaturations.  The channels recorded and monitored were frontal, central and occipital EEG, electrooculogram (EOG), submentalis EMG (chin), nasal and oral airflow, thoracic and abdominal wall motion, anterior tibialis EMG, snore microphone, electrocardiogram, and pulse oximetry.  MEDICATIONS Patient's medications include: Allopurinol, Pacerone, Eliquis, Symbivort, Cardizem, Vytorin, Ferrous Sulfate, Lasix, Amaryl, Insulin, Metolazone, Lopressor, Prilosec, Kdur, ProAir, Januvia, Spiriva.   Medications self-administered by patient during sleep study : No sleep medicine administered.  SLEEP ARCHITECTURE The study was initiated at 10:54:47 PM and ended at 5:07:56 AM.  Sleep onset time was 1.2 minutes and the sleep efficiency was reduced at 59.6%. The total sleep time was 222.5 minutes.  Stage REM latency was 46.5 minutes.  The patient spent 20.22% of the night in stage N1 sleep, 70.34% in stage N2 sleep, 2.47% in stage N3 and 6.97% in REM.  Alpha intrusion was absent.  Supine sleep was 100.00%.  RESPIRATORY PARAMETERS The overall apnea/hypopnea index (AHI) was 0.8 per hour. There were 0 total apneas, including 0 obstructive, 0 central and 0 mixed apneas. There were 3 hypopneas and 0 RERAs.  The AHI during Stage REM sleep was 7.7 per hour.  AHI while supine was 0.8 per  hour.  The mean oxygen saturation was 96.25%. The minimum SpO2 during sleep was 92.00%.  Moderate snoring was noted during this study.  CARDIAC DATA The 2 lead EKG demonstrated sinus rhythm. The mean heart rate was 64.54 beats per minute. Other EKG findings include: Rare PAC.  LEG MOVEMENT DATA The total PLMS were 31 with a resulting PLMS index of 8.36. Associated arousal with leg movement index was 0.0 .  IMPRESSIONS No significant obstructive sleep apnea occurred during this study (AHI = 0.8/h). No significant central sleep apnea occurred during this study (CAI = 0.0/h). Mild oxygen desaturation was noted during this study (Min O2 = 92.00%). The patient snored with Moderate snoring volume. EKG findings include Normal Sinus Rhythm. Mild periodic limb movements of sleep occurred during the study. No significant associated arousals. DIAGNOSIS Excessive Daytime Sleepiness  RECOMMENDATIONS Avoid alcohol, sedatives and other CNS depressants that may cause sleep apnea and disrupt normal sleep architecture. Sleep hygiene should be reviewed to assess factors that may improve sleep quality. Weight management and regular exercise should be initiated or continued if appropriate. The patient should be instructed to avoid sleeping supine which may help with snoring. Consider ENT referral to evaluate surgical causes of moderate snoring.   [place], American Board of Sleep Medicine  ELECTRONICALLY SIGNED ON:  09/08/2015, 11:32 AM [place] PH: [phone]   FX: [phone] [place]

## 2015-09-09 NOTE — Telephone Encounter (Signed)
LM for Jenny Reichmann (cousin) per [facility] to give results of sleep study.   Nurse stated that the patient does not take any results on her own.

## 2015-09-10 NOTE — Telephone Encounter (Signed)
Spoke with Jenny Reichmann to let her know results. Stated verbal understanding

## 2015-09-12 DIAGNOSIS — L84 Corns and callosities: Secondary | ICD-10-CM | POA: Diagnosis not present

## 2015-09-12 DIAGNOSIS — B351 Tinea unguium: Secondary | ICD-10-CM | POA: Diagnosis not present

## 2015-09-12 DIAGNOSIS — E119 Type 2 diabetes mellitus without complications: Secondary | ICD-10-CM | POA: Diagnosis not present

## 2015-09-13 DIAGNOSIS — B351 Tinea unguium: Secondary | ICD-10-CM | POA: Diagnosis not present

## 2015-09-13 DIAGNOSIS — E119 Type 2 diabetes mellitus without complications: Secondary | ICD-10-CM | POA: Diagnosis not present

## 2015-09-13 DIAGNOSIS — L84 Corns and callosities: Secondary | ICD-10-CM | POA: Diagnosis not present

## 2015-09-21 ENCOUNTER — Other Ambulatory Visit (HOSPITAL_BASED_OUTPATIENT_CLINIC_OR_DEPARTMENT_OTHER): Payer: Medicare Other

## 2015-09-21 ENCOUNTER — Ambulatory Visit: Payer: Federal, State, Local not specified - PPO | Admitting: Hematology & Oncology

## 2015-09-21 ENCOUNTER — Encounter: Payer: Self-pay | Admitting: Family

## 2015-09-21 ENCOUNTER — Other Ambulatory Visit: Payer: Federal, State, Local not specified - PPO

## 2015-09-21 ENCOUNTER — Other Ambulatory Visit (HOSPITAL_COMMUNITY): Payer: Self-pay | Admitting: Cardiology

## 2015-09-21 ENCOUNTER — Ambulatory Visit (HOSPITAL_BASED_OUTPATIENT_CLINIC_OR_DEPARTMENT_OTHER): Payer: Medicare Other | Admitting: Family

## 2015-09-21 VITALS — BP 148/53 | HR 65 | Temp 97.6°F | Resp 20 | Wt 300.0 lb

## 2015-09-21 DIAGNOSIS — I5030 Unspecified diastolic (congestive) heart failure: Secondary | ICD-10-CM

## 2015-09-21 DIAGNOSIS — D509 Iron deficiency anemia, unspecified: Secondary | ICD-10-CM

## 2015-09-21 DIAGNOSIS — Z7901 Long term (current) use of anticoagulants: Secondary | ICD-10-CM

## 2015-09-21 DIAGNOSIS — R9389 Abnormal findings on diagnostic imaging of other specified body structures: Secondary | ICD-10-CM

## 2015-09-21 LAB — RETICULOCYTES (CHCC)
ABS RETIC: 89 10*3/uL (ref 19.0–186.0)
RBC.: 3.87 MIL/uL (ref 3.87–5.11)
Retic Ct Pct: 2.3 % (ref 0.4–2.3)

## 2015-09-21 LAB — CBC WITH DIFFERENTIAL (CANCER CENTER ONLY)
BASO#: 0 10*3/uL (ref 0.0–0.2)
BASO%: 0.2 % (ref 0.0–2.0)
EOS%: 1.4 % (ref 0.0–7.0)
Eosinophils Absolute: 0.1 10*3/uL (ref 0.0–0.5)
HCT: 37.2 % (ref 34.8–46.6)
HGB: 11.8 g/dL (ref 11.6–15.9)
LYMPH#: 1.1 10*3/uL (ref 0.9–3.3)
LYMPH%: 10.9 % — AB (ref 14.0–48.0)
MCH: 30.4 pg (ref 26.0–34.0)
MCHC: 31.7 g/dL — ABNORMAL LOW (ref 32.0–36.0)
MCV: 96 fL (ref 81–101)
MONO#: 0.8 10*3/uL (ref 0.1–0.9)
MONO%: 8.2 % (ref 0.0–13.0)
NEUT#: 7.6 10*3/uL — ABNORMAL HIGH (ref 1.5–6.5)
NEUT%: 79.3 % (ref 39.6–80.0)
PLATELETS: 246 10*3/uL (ref 145–400)
RBC: 3.88 10*6/uL (ref 3.70–5.32)
RDW: 14.9 % (ref 11.1–15.7)
WBC: 9.6 10*3/uL (ref 3.9–10.0)

## 2015-09-21 LAB — COMPREHENSIVE METABOLIC PANEL (CC13)
ALBUMIN: 3.5 g/dL (ref 3.5–5.0)
ALT: 19 U/L (ref 0–55)
AST: 14 U/L (ref 5–34)
Alkaline Phosphatase: 98 U/L (ref 40–150)
Anion Gap: 10 mEq/L (ref 3–11)
BUN: 32.5 mg/dL — AB (ref 7.0–26.0)
CHLORIDE: 98 meq/L (ref 98–109)
CO2: 30 mEq/L — ABNORMAL HIGH (ref 22–29)
Calcium: 9.7 mg/dL (ref 8.4–10.4)
Creatinine: 1.5 mg/dL — ABNORMAL HIGH (ref 0.6–1.1)
EGFR: 34 mL/min/{1.73_m2} — ABNORMAL LOW (ref >90)
Glucose: 250 mg/dl — ABNORMAL HIGH (ref 70–140)
Potassium: 4 mEq/L (ref 3.5–5.1)
Sodium: 138 mEq/L (ref 136–145)
Total Bilirubin: 0.31 mg/dL (ref 0.20–1.20)
Total Protein: 7.1 g/dL (ref 6.4–8.3)

## 2015-09-21 LAB — IRON AND TIBC CHCC
%SAT: 23 % (ref 21–57)
Iron: 61 ug/dL (ref 41–142)
TIBC: 259 ug/dL (ref 236–444)
UIBC: 198 ug/dL (ref 120–384)

## 2015-09-21 LAB — FERRITIN CHCC: Ferritin: 185 ng/ml (ref 9–269)

## 2015-09-21 NOTE — Progress Notes (Signed)
Hematology and Oncology Follow Up Visit  Paige Hardin [id number] [birth year] 69 y.o. 09/21/2015   Principle Diagnosis:  Anemia secondary to iron deficiency  Anemia secondary to renal insufficiency  Chronic anticoagulation for paroxysmal atrial fibrillation  Current Therapy:   IV iron as indicated  Aranesp 300 mcg subcutaneous as needed for hemoglobin less than 11  ELIQUIS-long-term    Interim History: Paige Hardin is here today with her friend for a follow-up. She is doing fairly well. She continues to be on 2 L supplemental O2 24 hours a day. She did have a sleep study done where they were able to rule out sleep apnea.  Her blood sugars continue to be uncontrolled. She states that her most recent Hgb A1c was 8.0. Her fasting blood sugars in the AM generally run between 180 and 250.  She has a healthy appetite and is staying hydrated. She is on fluid restrictions with cardiology at 2L/day. She states that she is "always thirsty."   She last received iron in June 2015. In April, her ferritin was 188 with an iron saturation of 29%.   No fever, chills, n/v, cough, rash, headaches, dizziness, chest pain, palpitations, abdominal pain or changes in her bowel or bladder habits.  She has done well on Eliquis for her atrial fib and has had no episodes of bleeding, bruising or petechiae.  In may she had an irregular mass of the right breast biopsied. The pathology was negative for malignancy.   Medications:    Medication List       This list is accurate as of: 09/21/15  2:46 PM.  Always use your most recent med list.               acetaminophen 325 MG tablet  Commonly known as:  TYLENOL  Take 325 mg by mouth daily as needed for mild pain.     allopurinol 100 MG tablet  Commonly known as:  ZYLOPRIM  Take 1 tablet (100 mg total) by mouth daily.     amiodarone 200 MG tablet  Commonly known as:  PACERONE  Take 1 tablet (200 mg total) by mouth daily.     apixaban 5 MG Tabs tablet    Commonly known as:  ELIQUIS  Take 1 tablet (5 mg total) by mouth 2 (two) times daily.     budesonide-formoterol 160-4.5 MCG/ACT inhaler  Commonly known as:  SYMBICORT  Inhale 2 puffs into the lungs 2 (two) times daily.     calcium-vitamin D 500-200 MG-UNIT tablet  Commonly known as:  OSCAL WITH D  Take 1 tablet by mouth daily with breakfast.     diltiazem 180 MG 24 hr capsule  Commonly known as:  CARDIZEM CD  Take 1 capsule (180 mg total) by mouth daily.     ezetimibe-simvastatin 10-40 MG tablet  Commonly known as:  VYTORIN  Take 1 tablet by mouth at bedtime.     ferrous sulfate 325 (65 FE) MG tablet  Take 325 mg by mouth 2 (two) times daily with a meal.     furosemide 80 MG tablet  Commonly known as:  LASIX  Take 2 tablets (160 mg total) by mouth 2 (two) times daily. MAKE SURE TO TAKE AT 8 AM AND 2 PM     glimepiride 2 MG tablet  Commonly known as:  AMARYL  Take 2 mg by mouth daily with breakfast.     insulin aspart 100 UNIT/ML injection  Commonly known as:  novoLOG  Inject 3  Units into the skin 2 (two) times daily at 10 am and 4 pm.     Magnesium-Zinc 133.33-5 MG Tabs  Take 133 mg by mouth daily.     metolazone 2.5 MG tablet  Commonly known as:  ZAROXOLYN  Take 1 tablet only if weight is greater or equal to 300 lbs as needed     metoprolol 50 MG tablet  Commonly known as:  LOPRESSOR  Take 1 tablet (50 mg total) by mouth 2 (two) times daily.     MULTIVITAMIN/IRON PO  Take 1 tablet by mouth daily.     omeprazole 20 MG capsule  Commonly known as:  PRILOSEC  Take 20 mg by mouth daily.     potassium chloride SA 20 MEQ tablet  Commonly known as:  K-DUR,KLOR-CON  Take 20 mEq by mouth 3 (three) times daily. MAKE SURE TO TAKE EXTRA 20 MEQ ON METOLAZONE DAYS     PROAIR HFA 108 (90 BASE) MCG/ACT inhaler  Generic drug:  albuterol  1 puff every 4 (four) hours as needed.     SAFETY-LOK INSULIN SYR 1CC/29G 29G X 1/2" 1 ML Misc  Generic drug:  INSULIN SYRINGE 1CC/29G      sitaGLIPtin 100 MG tablet  Commonly known as:  JANUVIA  Take 100 mg by mouth daily.     tiotropium 18 MCG inhalation capsule  Commonly known as:  SPIRIVA  Place 1 capsule (18 mcg total) into inhaler and inhale daily.     vitamin B-12 500 MCG tablet  Commonly known as:  CYANOCOBALAMIN  Take 500 mcg by mouth daily.     Vitamin C 500 MG Caps  Take 500 mg by mouth daily.     Vitamin D 2000 UNITS tablet  Take 2,000 Units by mouth daily.        Allergies:  Allergies  Allergen Reactions  . Ace Inhibitors Cough  . Clindamycin Rash    Past Medical History, Surgical history, Social history, and Family History were reviewed and updated.  Review of Systems: All other 10 point review of systems is negative.   Physical Exam:  weight is 300 lb (136.079 kg). Her oral temperature is 97.6 F (36.4 C). Her blood pressure is 148/53 and her pulse is 65. Her respiration is 20 and oxygen saturation is 95%.   Wt Readings from Last 3 Encounters:  09/21/15 300 lb (136.079 kg)  09/03/15 295 lb (133.811 kg)  09/02/15 302 lb (136.986 kg)    Ocular: Sclerae unicteric, pupils equal, round and reactive to light Ear-nose-throat: Oropharynx clear, dentition fair Lymphatic: No cervical or supraclavicular adenopathy Lungs no rales or rhonchi, good excursion bilaterally Heart regular rate and rhythm, no murmur appreciated Abd soft, nontender, positive bowel sounds MSK no focal spinal tenderness, no joint edema Neuro: non-focal, well-oriented, appropriate affect Breasts: Deferred  Lab Results  Component Value Date   WBC 9.6 09/21/2015   HGB 11.8 09/21/2015   HCT 37.2 09/21/2015   MCV 96 09/21/2015   PLT 246 09/21/2015   Lab Results  Component Value Date   FERRITIN 185 09/21/2015   IRON 61 09/21/2015   TIBC 259 09/21/2015   UIBC 198 09/21/2015   IRONPCTSAT 23 09/21/2015   Lab Results  Component Value Date   RETICCTPCT 1.9 03/23/2015   RBC 3.88 09/21/2015   RETICCTABS 72.0  03/23/2015   No results found for: KPAFRELGTCHN, LAMBDASER, KAPLAMBRATIO No results found for: IGGSERUM, IGA, IGMSERUM No results found for: TOTALPROTELP, ALBUMINELP, A1GS, A2GS, BETS, BETA2SER, [place], MSPIKE, SPEI  Chemistry      Component Value Date/Time   NA 138 09/21/2015 1103   NA 137 09/02/2015 1218   NA 139 12/22/2014 1019   K 4.0 09/21/2015 1103   K 4.3 09/02/2015 1218   K 4.2 12/22/2014 1019   CL 96* 09/02/2015 1218   CL 96* 12/22/2014 1019   CO2 30* 09/21/2015 1103   CO2 32 09/02/2015 1218   CO2 31 12/22/2014 1019   BUN 32.5* 09/21/2015 1103   BUN 35* 09/02/2015 1218   BUN 32* 12/22/2014 1019   CREATININE 1.5* 09/21/2015 1103   CREATININE 1.63* 09/02/2015 1218   CREATININE 1.5* 12/22/2014 1019      Component Value Date/Time   CALCIUM 9.7 09/21/2015 1103   CALCIUM 9.6 09/02/2015 1218   CALCIUM 9.4 12/22/2014 1019   ALKPHOS 98 09/21/2015 1103   ALKPHOS 83 09/02/2015 1218   ALKPHOS 76 12/22/2014 1019   AST 14 09/21/2015 1103   AST 24 09/02/2015 1218   AST 19 12/22/2014 1019   ALT 19 09/21/2015 1103   ALT 23 09/02/2015 1218   ALT 18 12/22/2014 1019   BILITOT 0.31 09/21/2015 1103   BILITOT 0.5 09/02/2015 1218   BILITOT 0.60 12/22/2014 1019     Impression and Plan: Paige Hardin is 69 year old [place] female with multifactorial anemia (iron deficiency/renal insufficiency). This has not been a problem for her for quite some time.  Her Hgb is holding at 11.8 with an MCV of 96. We will see what her iron studies show. I do not think she will need any at this time.  She will not be needing Aranesp today.  We will see her back in 6 months for labs and follow-up.  She knows to call here with any questions or concerns and to go to the ED in the event of an emergency. We can certainly see her sooner if need be.   Eliezer Bottom, NP 10/25/20162:46 PM

## 2015-10-02 IMAGING — DX DG CHEST 1V PORT
1 series · 1 of 1 positions shown · non-contrast
Comparison: DG CHEST 2 VIEW dated 01/22/2014

CLINICAL DATA: Tachycardia, chest pressure, former smoker

EXAM:
PORTABLE CHEST - 1 VIEW

[portable]
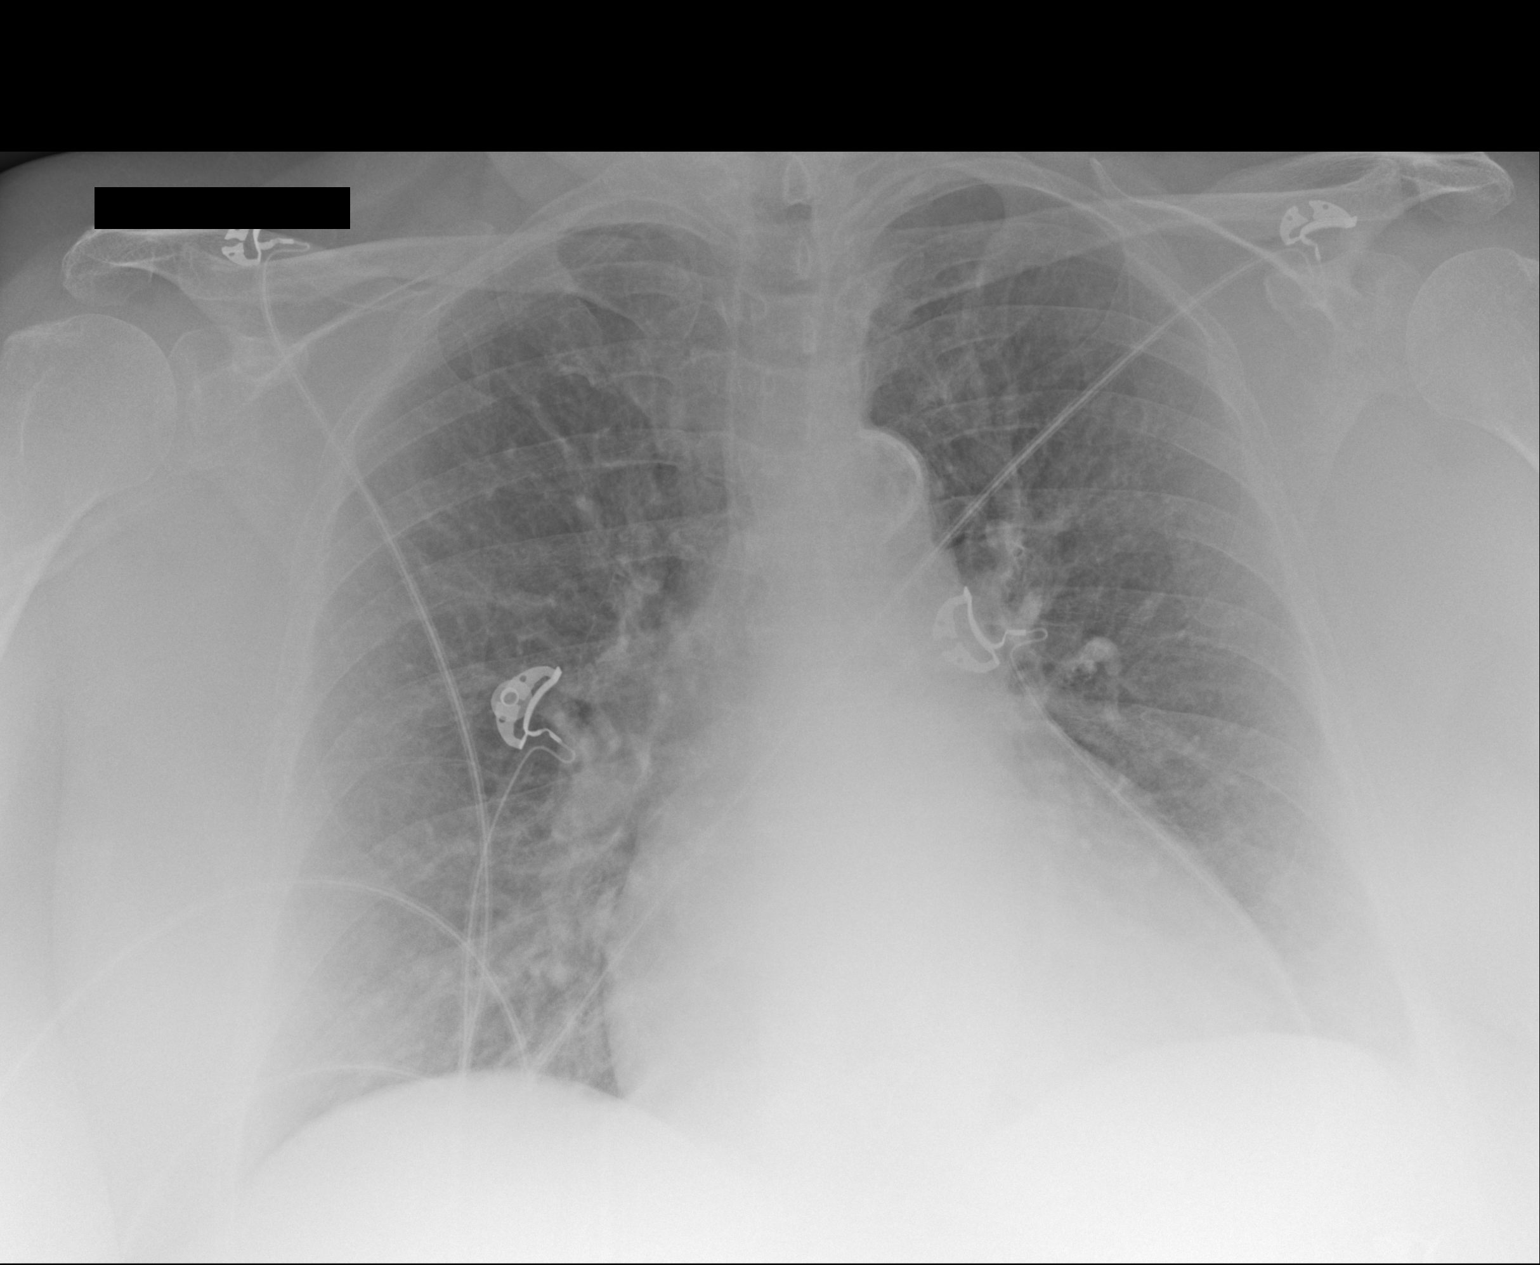

[1 of 1 positions shown; findings below may reference images not displayed]

FINDINGS: Mild cardiac enlargement. Stable calcification of the aortic arch.
Mild vascular congestion. No pulmonary edema or pleural effusion.
IMPRESSION: Stable cardiac enlargement with mild vascular congestion but no
pulmonary edema.

## 2015-10-06 ENCOUNTER — Ambulatory Visit (HOSPITAL_COMMUNITY): Payer: Medicare Other

## 2015-10-07 ENCOUNTER — Ambulatory Visit (HOSPITAL_COMMUNITY)
Admission: RE | Admit: 2015-10-07 | Discharge: 2015-10-07 | Disposition: A | Discharge status: Home / Self Care | Payer: Medicare Other | Source: Ambulatory Visit | Attending: Cardiology | Admitting: Cardiology

## 2015-10-07 DIAGNOSIS — I5030 Unspecified diastolic (congestive) heart failure: Secondary | ICD-10-CM | POA: Diagnosis not present

## 2015-10-07 DIAGNOSIS — R9389 Abnormal findings on diagnostic imaging of other specified body structures: Secondary | ICD-10-CM

## 2015-10-07 DIAGNOSIS — R938 Abnormal findings on diagnostic imaging of other specified body structures: Secondary | ICD-10-CM | POA: Diagnosis not present

## 2015-10-07 DIAGNOSIS — R911 Solitary pulmonary nodule: Secondary | ICD-10-CM | POA: Insufficient documentation

## 2015-11-04 IMAGING — CR DG CHEST 1V PORT
1 series · 1 of 1 positions shown · non-contrast
Comparison: DG CHEST 1V PORT dated 02/27/2014; DG CHEST 1V PORT dated
02/26/2014; DG CHEST 1V PORT dated 01/14/2014

CLINICAL DATA: Respiratory failure.

EXAM:
PORTABLE CHEST - 1 VIEW

[AP]
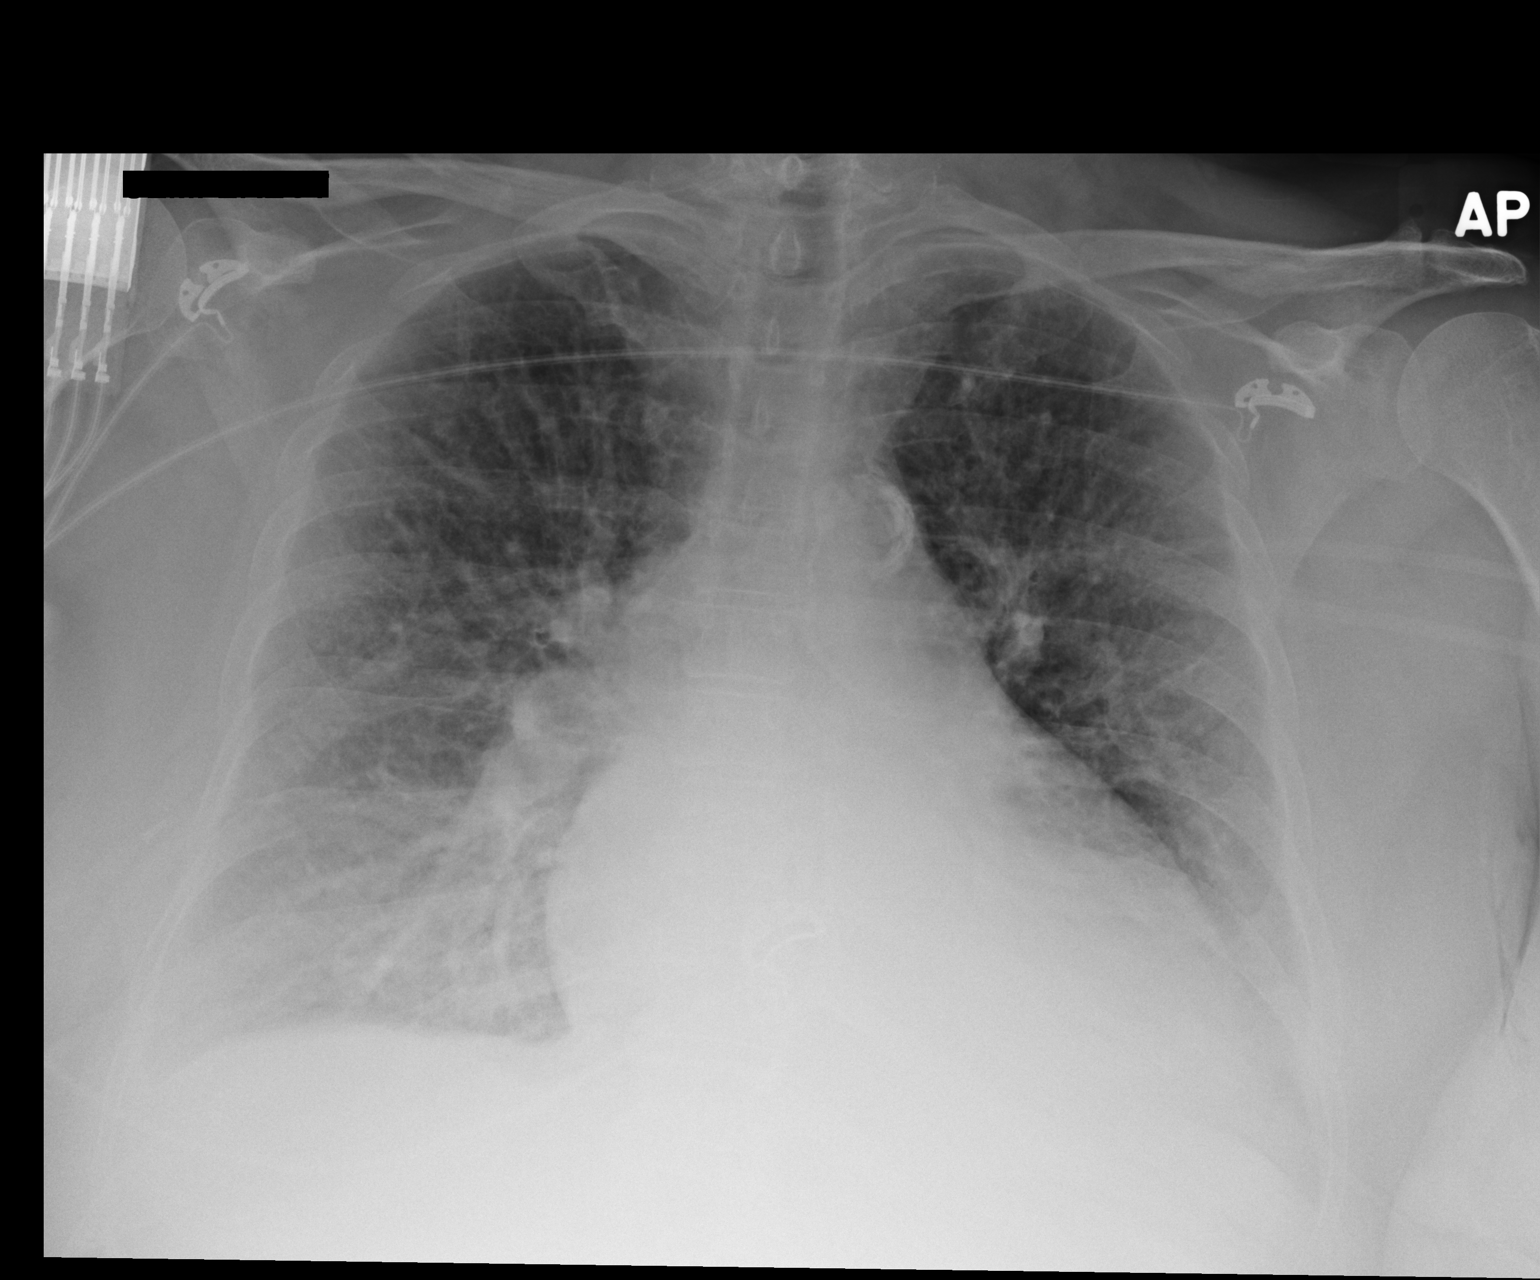

[1 of 1 positions shown; findings below may reference images not displayed]

FINDINGS: 5786 hr. There is stable cardiomegaly and aortic atherosclerosis.
Interstitial edema has improved over the last 2 days. There is no
confluent airspace opacity, significant pleural effusion or
pneumothorax. The osseous structures appear unchanged for
IMPRESSION: Improving pulmonary edema with persistent cardiomegaly and residual
vascular congestion.

## 2015-11-05 IMAGING — CR DG CHEST 1V PORT
1 series · 1 of 1 positions shown · non-contrast
Comparison: 02/28/2014

CLINICAL DATA: COPD

EXAM:
PORTABLE CHEST - 1 VIEW

[AP]
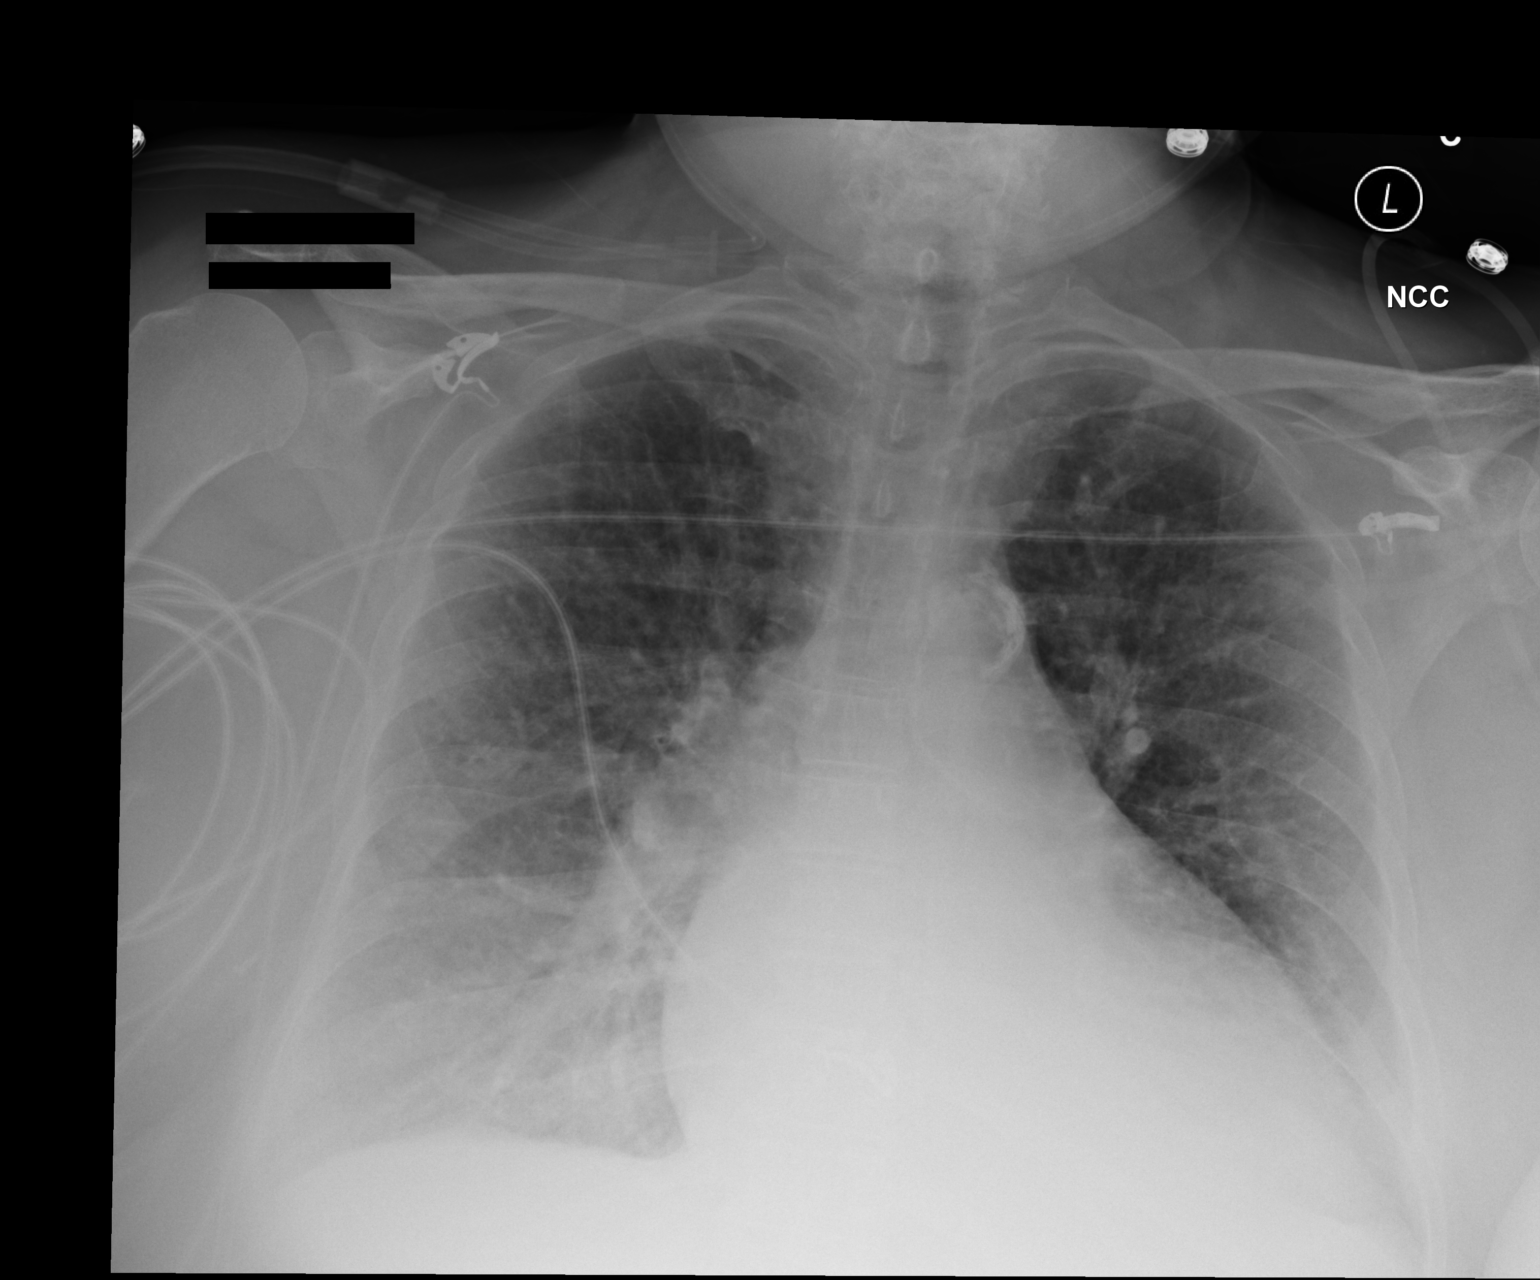

[1 of 1 positions shown; findings below may reference images not displayed]

FINDINGS: Stable cardiomegaly with mild interstitial edema pattern and basilar
atelectasis, worse on the left. No enlarging effusion or
pneumothorax. Trachea midline. Atherosclerosis of the aorta.
IMPRESSION: Stable mild edema pattern.

## 2015-11-16 ENCOUNTER — Encounter (HOSPITAL_COMMUNITY): Payer: Self-pay | Admitting: Emergency Medicine

## 2015-11-16 ENCOUNTER — Emergency Department (HOSPITAL_COMMUNITY): Payer: Medicare Other

## 2015-11-16 ENCOUNTER — Inpatient Hospital Stay (HOSPITAL_COMMUNITY)
Admission: EM | Admit: 2015-11-16 | Discharge: 2015-11-19 | DRG: 190 | Disposition: A | Discharge status: Home / Self Care | Payer: Medicare Other | Attending: Internal Medicine | Admitting: Internal Medicine

## 2015-11-16 DIAGNOSIS — J440 Chronic obstructive pulmonary disease with acute lower respiratory infection: Secondary | ICD-10-CM | POA: Diagnosis not present

## 2015-11-16 DIAGNOSIS — I5032 Chronic diastolic (congestive) heart failure: Secondary | ICD-10-CM

## 2015-11-16 DIAGNOSIS — Z7984 Long term (current) use of oral hypoglycemic drugs: Secondary | ICD-10-CM

## 2015-11-16 DIAGNOSIS — I5031 Acute diastolic (congestive) heart failure: Secondary | ICD-10-CM

## 2015-11-16 DIAGNOSIS — J438 Other emphysema: Secondary | ICD-10-CM

## 2015-11-16 DIAGNOSIS — R498 Other voice and resonance disorders: Secondary | ICD-10-CM

## 2015-11-16 DIAGNOSIS — E785 Hyperlipidemia, unspecified: Secondary | ICD-10-CM

## 2015-11-16 DIAGNOSIS — Z87891 Personal history of nicotine dependence: Secondary | ICD-10-CM | POA: Diagnosis not present

## 2015-11-16 DIAGNOSIS — Z9981 Dependence on supplemental oxygen: Secondary | ICD-10-CM

## 2015-11-16 DIAGNOSIS — Z79899 Other long term (current) drug therapy: Secondary | ICD-10-CM

## 2015-11-16 DIAGNOSIS — E1165 Type 2 diabetes mellitus with hyperglycemia: Secondary | ICD-10-CM | POA: Diagnosis present

## 2015-11-16 DIAGNOSIS — J441 Chronic obstructive pulmonary disease with (acute) exacerbation: Secondary | ICD-10-CM | POA: Diagnosis present

## 2015-11-16 DIAGNOSIS — D509 Iron deficiency anemia, unspecified: Secondary | ICD-10-CM | POA: Diagnosis present

## 2015-11-16 DIAGNOSIS — N183 Chronic kidney disease, stage 3 unspecified: Secondary | ICD-10-CM | POA: Diagnosis present

## 2015-11-16 DIAGNOSIS — K219 Gastro-esophageal reflux disease without esophagitis: Secondary | ICD-10-CM | POA: Diagnosis present

## 2015-11-16 DIAGNOSIS — N390 Urinary tract infection, site not specified: Secondary | ICD-10-CM | POA: Diagnosis present

## 2015-11-16 DIAGNOSIS — R042 Hemoptysis: Secondary | ICD-10-CM

## 2015-11-16 DIAGNOSIS — I480 Paroxysmal atrial fibrillation: Secondary | ICD-10-CM | POA: Diagnosis present

## 2015-11-16 DIAGNOSIS — B961 Klebsiella pneumoniae [K. pneumoniae] as the cause of diseases classified elsewhere: Secondary | ICD-10-CM | POA: Diagnosis present

## 2015-11-16 DIAGNOSIS — Z66 Do not resuscitate: Secondary | ICD-10-CM | POA: Diagnosis present

## 2015-11-16 DIAGNOSIS — Z888 Allergy status to other drugs, medicaments and biological substances status: Secondary | ICD-10-CM

## 2015-11-16 DIAGNOSIS — I130 Hypertensive heart and chronic kidney disease with heart failure and stage 1 through stage 4 chronic kidney disease, or unspecified chronic kidney disease: Secondary | ICD-10-CM | POA: Diagnosis present

## 2015-11-16 DIAGNOSIS — Z6841 Body Mass Index (BMI) 40.0 and over, adult: Secondary | ICD-10-CM | POA: Diagnosis not present

## 2015-11-16 DIAGNOSIS — R0602 Shortness of breath: Secondary | ICD-10-CM | POA: Diagnosis not present

## 2015-11-16 DIAGNOSIS — Z7901 Long term (current) use of anticoagulants: Secondary | ICD-10-CM

## 2015-11-16 DIAGNOSIS — J9601 Acute respiratory failure with hypoxia: Secondary | ICD-10-CM

## 2015-11-16 DIAGNOSIS — I4510 Unspecified right bundle-branch block: Secondary | ICD-10-CM | POA: Diagnosis present

## 2015-11-16 DIAGNOSIS — J189 Pneumonia, unspecified organism: Secondary | ICD-10-CM | POA: Diagnosis present

## 2015-11-16 DIAGNOSIS — I272 Pulmonary hypertension, unspecified: Secondary | ICD-10-CM

## 2015-11-16 DIAGNOSIS — M545 Low back pain: Secondary | ICD-10-CM | POA: Diagnosis present

## 2015-11-16 DIAGNOSIS — I4891 Unspecified atrial fibrillation: Secondary | ICD-10-CM | POA: Diagnosis not present

## 2015-11-16 DIAGNOSIS — R069 Unspecified abnormalities of breathing: Secondary | ICD-10-CM | POA: Diagnosis not present

## 2015-11-16 DIAGNOSIS — I159 Secondary hypertension, unspecified: Secondary | ICD-10-CM

## 2015-11-16 DIAGNOSIS — E669 Obesity, unspecified: Secondary | ICD-10-CM | POA: Diagnosis present

## 2015-11-16 DIAGNOSIS — G4719 Other hypersomnia: Secondary | ICD-10-CM

## 2015-11-16 DIAGNOSIS — Z881 Allergy status to other antibiotic agents status: Secondary | ICD-10-CM

## 2015-11-16 DIAGNOSIS — G8929 Other chronic pain: Secondary | ICD-10-CM | POA: Diagnosis present

## 2015-11-16 DIAGNOSIS — Z794 Long term (current) use of insulin: Secondary | ICD-10-CM

## 2015-11-16 DIAGNOSIS — J45901 Unspecified asthma with (acute) exacerbation: Secondary | ICD-10-CM

## 2015-11-16 DIAGNOSIS — R079 Chest pain, unspecified: Secondary | ICD-10-CM

## 2015-11-16 LAB — CBC WITH DIFFERENTIAL/PLATELET
BASOS PCT: 0 %
Basophils Absolute: 0 10*3/uL (ref 0.0–0.1)
EOS ABS: 0.1 10*3/uL (ref 0.0–0.7)
Eosinophils Relative: 0 %
HCT: 35.7 % — ABNORMAL LOW (ref 36.0–46.0)
Hemoglobin: 11.5 g/dL — ABNORMAL LOW (ref 12.0–15.0)
Lymphocytes Relative: 6 %
Lymphs Abs: 1.1 10*3/uL (ref 0.7–4.0)
MCH: 30.3 pg (ref 26.0–34.0)
MCHC: 32.2 g/dL (ref 30.0–36.0)
MCV: 94.2 fL (ref 78.0–100.0)
MONO ABS: 0.8 10*3/uL (ref 0.1–1.0)
MONOS PCT: 4 %
Neutro Abs: 16.7 10*3/uL — ABNORMAL HIGH (ref 1.7–7.7)
Neutrophils Relative %: 90 %
Platelets: 247 10*3/uL (ref 150–400)
RBC: 3.79 MIL/uL — ABNORMAL LOW (ref 3.87–5.11)
RDW: 15.1 % (ref 11.5–15.5)
WBC: 18.6 10*3/uL — ABNORMAL HIGH (ref 4.0–10.5)

## 2015-11-16 LAB — URINALYSIS, ROUTINE W REFLEX MICROSCOPIC
Bilirubin Urine: NEGATIVE
KETONES UR: NEGATIVE mg/dL
Leukocytes, UA: NEGATIVE
Nitrite: POSITIVE — AB
PROTEIN: NEGATIVE mg/dL
Specific Gravity, Urine: 1.015 (ref 1.005–1.030)
pH: 5.5 (ref 5.0–8.0)

## 2015-11-16 LAB — BASIC METABOLIC PANEL
Anion gap: 10 (ref 5–15)
BUN: 34 mg/dL — ABNORMAL HIGH (ref 6–20)
CALCIUM: 9.4 mg/dL (ref 8.9–10.3)
CO2: 31 mmol/L (ref 22–32)
CREATININE: 1.47 mg/dL — AB (ref 0.44–1.00)
Chloride: 95 mmol/L — ABNORMAL LOW (ref 101–111)
GFR calc non Af Amer: 35 mL/min — ABNORMAL LOW (ref >60)
GFR, EST AFRICAN AMERICAN: 41 mL/min — AB (ref >60)
Glucose, Bld: 439 mg/dL — ABNORMAL HIGH (ref 65–99)
Potassium: 4.5 mmol/L (ref 3.5–5.1)
SODIUM: 136 mmol/L (ref 135–145)

## 2015-11-16 LAB — URINE MICROSCOPIC-ADD ON

## 2015-11-16 LAB — STREP PNEUMONIAE URINARY ANTIGEN: Strep Pneumo Urinary Antigen: NEGATIVE

## 2015-11-16 LAB — GLUCOSE, CAPILLARY
GLUCOSE-CAPILLARY: 420 mg/dL — AB (ref 65–99)
GLUCOSE-CAPILLARY: 398 mg/dL — AB (ref 65–99)
GLUCOSE-CAPILLARY: 280 mg/dL — AB (ref 65–99)

## 2015-11-16 LAB — CBG MONITORING, ED
Glucose-Capillary: 408 mg/dL — ABNORMAL HIGH (ref 65–99)
Glucose-Capillary: 366 mg/dL — ABNORMAL HIGH (ref 65–99)
Glucose-Capillary: 342 mg/dL — ABNORMAL HIGH (ref 65–99)

## 2015-11-16 LAB — TROPONIN I

## 2015-11-16 LAB — BRAIN NATRIURETIC PEPTIDE: B NATRIURETIC PEPTIDE 5: 166.6 pg/mL — AB (ref 0.0–100.0)

## 2015-11-16 LAB — I-STAT CG4 LACTIC ACID, ED: Lactic Acid, Venous: 1.39 mmol/L (ref 0.5–2.0)

## 2015-11-16 MED ORDER — INSULIN ASPART 100 UNIT/ML ~~LOC~~ SOLN
3.0000 [IU] | Freq: Two times a day (BID) | SUBCUTANEOUS | Status: DC
  Filled 2015-11-16: qty 1

## 2015-11-16 MED ORDER — INSULIN ASPART 100 UNIT/ML ~~LOC~~ SOLN
0.0000 [IU] | Freq: Three times a day (TID) | SUBCUTANEOUS | Status: DC
Start: 2015-11-16 — End: 2015-11-19
  Administered 2015-11-16: 20 [IU] via SUBCUTANEOUS
  Administered 2015-11-17 (×2): 7 [IU] via SUBCUTANEOUS
  Administered 2015-11-17: 4 [IU] via SUBCUTANEOUS
  Administered 2015-11-18 (×2): 7 [IU] via SUBCUTANEOUS
  Administered 2015-11-18: 4 [IU] via SUBCUTANEOUS
  Administered 2015-11-19: 20 [IU] via SUBCUTANEOUS
  Administered 2015-11-19: 11 [IU] via SUBCUTANEOUS

## 2015-11-16 MED ORDER — GUAIFENESIN ER 600 MG PO TB12
600.0000 mg | ORAL_TABLET | Freq: Two times a day (BID) | ORAL | Status: DC
  Administered 2015-11-16 – 2015-11-19 (×7): 600 mg via ORAL
  Filled 2015-11-16 (×8): qty 1

## 2015-11-16 MED ORDER — ALBUTEROL SULFATE HFA 108 (90 BASE) MCG/ACT IN AERS: 2.0000 | INHALATION_SPRAY | Freq: Four times a day (QID) | RESPIRATORY_TRACT | Status: DC | PRN

## 2015-11-16 MED ORDER — ALLOPURINOL 100 MG PO TABS
100.0000 mg | ORAL_TABLET | Freq: Every day | ORAL | Status: DC
  Administered 2015-11-16 – 2015-11-19 (×4): 100 mg via ORAL
  Filled 2015-11-16 (×4): qty 1

## 2015-11-16 MED ORDER — AMIODARONE HCL 200 MG PO TABS
200.0000 mg | ORAL_TABLET | Freq: Every day | ORAL | Status: DC
  Administered 2015-11-16 – 2015-11-19 (×4): 200 mg via ORAL
  Filled 2015-11-16 (×4): qty 1

## 2015-11-16 MED ORDER — INSULIN ASPART 100 UNIT/ML ~~LOC~~ SOLN
0.0000 [IU] | Freq: Every day | SUBCUTANEOUS | Status: DC
Start: 2015-11-16 — End: 2015-11-19
  Administered 2015-11-16: 3 [IU] via SUBCUTANEOUS
  Administered 2015-11-17 – 2015-11-18 (×2): 2 [IU] via SUBCUTANEOUS

## 2015-11-16 MED ORDER — PREDNISONE 20 MG PO TABS
60.0000 mg | ORAL_TABLET | Freq: Once | ORAL | Status: AC
  Administered 2015-11-16: 60 mg via ORAL
  Filled 2015-11-16: qty 3

## 2015-11-16 MED ORDER — TIOTROPIUM BROMIDE MONOHYDRATE 18 MCG IN CAPS
18.0000 ug | ORAL_CAPSULE | Freq: Every day | RESPIRATORY_TRACT | Status: DC
  Administered 2015-11-16 – 2015-11-18 (×3): 18 ug via RESPIRATORY_TRACT
  Filled 2015-11-16 (×2): qty 5

## 2015-11-16 MED ORDER — CALCIUM CARBONATE-VITAMIN D 500-200 MG-UNIT PO TABS
1.0000 | ORAL_TABLET | Freq: Every day | ORAL | Status: DC
  Administered 2015-11-16 – 2015-11-19 (×4): 1 via ORAL
  Filled 2015-11-16 (×5): qty 1

## 2015-11-16 MED ORDER — ALBUTEROL SULFATE (2.5 MG/3ML) 0.083% IN NEBU: 2.5000 mg | INHALATION_SOLUTION | RESPIRATORY_TRACT | Status: DC | PRN

## 2015-11-16 MED ORDER — BUDESONIDE-FORMOTEROL FUMARATE 160-4.5 MCG/ACT IN AERO: 2.0000 | INHALATION_SPRAY | Freq: Two times a day (BID) | RESPIRATORY_TRACT | Status: DC

## 2015-11-16 MED ORDER — ARFORMOTEROL TARTRATE 15 MCG/2ML IN NEBU
15.0000 ug | INHALATION_SOLUTION | Freq: Two times a day (BID) | RESPIRATORY_TRACT | Status: DC
  Administered 2015-11-16 – 2015-11-19 (×7): 15 ug via RESPIRATORY_TRACT
  Filled 2015-11-16 (×9): qty 2

## 2015-11-16 MED ORDER — SODIUM CHLORIDE 0.9 % IJ SOLN
3.0000 mL | Freq: Two times a day (BID) | INTRAMUSCULAR | Status: DC
  Administered 2015-11-16 – 2015-11-19 (×5): 3 mL via INTRAVENOUS

## 2015-11-16 MED ORDER — EZETIMIBE-SIMVASTATIN 10-40 MG PO TABS
1.0000 | ORAL_TABLET | Freq: Every day | ORAL | Status: DC
  Administered 2015-11-16 – 2015-11-18 (×3): 1 via ORAL
  Filled 2015-11-16 (×5): qty 1

## 2015-11-16 MED ORDER — POTASSIUM CHLORIDE CRYS ER 20 MEQ PO TBCR
20.0000 meq | EXTENDED_RELEASE_TABLET | Freq: Three times a day (TID) | ORAL | Status: DC
  Administered 2015-11-16 – 2015-11-19 (×10): 20 meq via ORAL
  Filled 2015-11-16 (×10): qty 1

## 2015-11-16 MED ORDER — ALBUTEROL SULFATE (2.5 MG/3ML) 0.083% IN NEBU: 2.5000 mg | INHALATION_SOLUTION | Freq: Four times a day (QID) | RESPIRATORY_TRACT | Status: DC | PRN

## 2015-11-16 MED ORDER — INSULIN ASPART 100 UNIT/ML ~~LOC~~ SOLN
10.0000 [IU] | Freq: Once | SUBCUTANEOUS | Status: AC
  Administered 2015-11-16: 10 [IU] via INTRAVENOUS
  Filled 2015-11-16: qty 1

## 2015-11-16 MED ORDER — INSULIN ASPART 100 UNIT/ML ~~LOC~~ SOLN
3.0000 [IU] | Freq: Two times a day (BID) | SUBCUTANEOUS | Status: DC
  Administered 2015-11-16 – 2015-11-17 (×2): 3 [IU] via SUBCUTANEOUS

## 2015-11-16 MED ORDER — BUDESONIDE 0.5 MG/2ML IN SUSP
0.5000 mg | Freq: Two times a day (BID) | RESPIRATORY_TRACT | Status: DC
  Administered 2015-11-16 – 2015-11-19 (×7): 0.5 mg via RESPIRATORY_TRACT
  Filled 2015-11-16 (×9): qty 2

## 2015-11-16 MED ORDER — DEXTROSE 5 % IV SOLN
1.0000 g | INTRAVENOUS | Status: DC
  Administered 2015-11-16 – 2015-11-19 (×4): 1 g via INTRAVENOUS
  Filled 2015-11-16 (×5): qty 10

## 2015-11-16 MED ORDER — ALBUTEROL SULFATE (2.5 MG/3ML) 0.083% IN NEBU
2.5000 mg | INHALATION_SOLUTION | Freq: Four times a day (QID) | RESPIRATORY_TRACT | Status: DC
  Administered 2015-11-16 – 2015-11-18 (×9): 2.5 mg via RESPIRATORY_TRACT
  Filled 2015-11-16 (×9): qty 3

## 2015-11-16 MED ORDER — ALBUTEROL SULFATE (2.5 MG/3ML) 0.083% IN NEBU
5.0000 mg | INHALATION_SOLUTION | Freq: Once | RESPIRATORY_TRACT | Status: AC
  Administered 2015-11-16: 5 mg via RESPIRATORY_TRACT
  Filled 2015-11-16: qty 6

## 2015-11-16 MED ORDER — APIXABAN 5 MG PO TABS
5.0000 mg | ORAL_TABLET | Freq: Two times a day (BID) | ORAL | Status: DC
Start: 2015-11-16 — End: 2015-11-19
  Administered 2015-11-16 – 2015-11-19 (×7): 5 mg via ORAL
  Filled 2015-11-16 (×8): qty 1

## 2015-11-16 MED ORDER — VITAMIN C 500 MG PO TABS
500.0000 mg | ORAL_TABLET | Freq: Every day | ORAL | Status: DC
  Administered 2015-11-16 – 2015-11-19 (×4): 500 mg via ORAL
  Filled 2015-11-16 (×4): qty 1

## 2015-11-16 MED ORDER — INSULIN ASPART 100 UNIT/ML ~~LOC~~ SOLN
20.0000 [IU] | Freq: Once | SUBCUTANEOUS | Status: AC
  Administered 2015-11-16: 20 [IU] via SUBCUTANEOUS

## 2015-11-16 MED ORDER — DOXYCYCLINE HYCLATE 100 MG PO TABS
100.0000 mg | ORAL_TABLET | Freq: Two times a day (BID) | ORAL | Status: DC
  Administered 2015-11-16 – 2015-11-19 (×7): 100 mg via ORAL
  Filled 2015-11-16 (×7): qty 1

## 2015-11-16 MED ORDER — DOXYCYCLINE HYCLATE 100 MG IV SOLR
100.0000 mg | Freq: Once | INTRAVENOUS | Status: AC
  Administered 2015-11-16: 100 mg via INTRAVENOUS
  Filled 2015-11-16: qty 100

## 2015-11-16 MED ORDER — FUROSEMIDE 80 MG PO TABS
160.0000 mg | ORAL_TABLET | Freq: Two times a day (BID) | ORAL | Status: DC
  Administered 2015-11-16 – 2015-11-19 (×7): 160 mg via ORAL
  Filled 2015-11-16 (×4): qty 2
  Filled 2015-11-16: qty 8
  Filled 2015-11-16 (×2): qty 2

## 2015-11-16 MED ORDER — ACETAMINOPHEN 325 MG PO TABS: 325.0000 mg | ORAL_TABLET | Freq: Every day | ORAL | Status: DC | PRN

## 2015-11-16 MED ORDER — METOLAZONE 5 MG PO TABS
2.5000 mg | ORAL_TABLET | Freq: Once | ORAL | Status: DC | PRN
  Filled 2015-11-16: qty 1

## 2015-11-16 MED ORDER — LEVOFLOXACIN IN D5W 750 MG/150ML IV SOLN
750.0000 mg | Freq: Once | INTRAVENOUS | Status: AC
  Administered 2015-11-16: 750 mg via INTRAVENOUS
  Filled 2015-11-16: qty 150

## 2015-11-16 MED ORDER — BENZONATATE 100 MG PO CAPS: 200.0000 mg | ORAL_CAPSULE | Freq: Two times a day (BID) | ORAL | Status: DC | PRN

## 2015-11-16 MED ORDER — PANTOPRAZOLE SODIUM 40 MG PO TBEC
40.0000 mg | DELAYED_RELEASE_TABLET | Freq: Every day | ORAL | Status: DC
  Administered 2015-11-16 – 2015-11-19 (×4): 40 mg via ORAL
  Filled 2015-11-16 (×4): qty 1

## 2015-11-16 MED ORDER — FERROUS SULFATE 325 (65 FE) MG PO TABS
325.0000 mg | ORAL_TABLET | Freq: Two times a day (BID) | ORAL | Status: DC
  Administered 2015-11-16 – 2015-11-19 (×7): 325 mg via ORAL
  Filled 2015-11-16 (×9): qty 1

## 2015-11-16 MED ORDER — DILTIAZEM HCL ER COATED BEADS 180 MG PO CP24
180.0000 mg | ORAL_CAPSULE | Freq: Every day | ORAL | Status: DC
  Administered 2015-11-16 – 2015-11-19 (×4): 180 mg via ORAL
  Filled 2015-11-16 (×4): qty 1

## 2015-11-16 MED ORDER — VITAMIN D 1000 UNITS PO TABS
2000.0000 [IU] | ORAL_TABLET | Freq: Every day | ORAL | Status: DC
  Administered 2015-11-16 – 2015-11-19 (×4): 2000 [IU] via ORAL
  Filled 2015-11-16 (×4): qty 2

## 2015-11-16 MED ORDER — INSULIN GLARGINE 100 UNIT/ML ~~LOC~~ SOLN
5.0000 [IU] | Freq: Every day | SUBCUTANEOUS | Status: DC
  Administered 2015-11-16 – 2015-11-18 (×3): 5 [IU] via SUBCUTANEOUS
  Filled 2015-11-16 (×4): qty 0.05

## 2015-11-16 MED ORDER — METOPROLOL TARTRATE 50 MG PO TABS
50.0000 mg | ORAL_TABLET | Freq: Two times a day (BID) | ORAL | Status: DC
  Administered 2015-11-16 – 2015-11-19 (×7): 50 mg via ORAL
  Filled 2015-11-16: qty 2
  Filled 2015-11-16 (×6): qty 1

## 2015-11-16 MED ORDER — VITAMIN B-12 1000 MCG PO TABS
500.0000 ug | ORAL_TABLET | Freq: Every day | ORAL | Status: DC
  Administered 2015-11-16 – 2015-11-19 (×4): 500 ug via ORAL
  Filled 2015-11-16: qty 0.5
  Filled 2015-11-16 (×3): qty 1

## 2015-11-16 MED ORDER — INSULIN ASPART 100 UNIT/ML ~~LOC~~ SOLN
0.0000 [IU] | SUBCUTANEOUS | Status: DC
  Administered 2015-11-16: 7 [IU] via SUBCUTANEOUS
  Filled 2015-11-16: qty 1

## 2015-11-16 NOTE — ED Notes (Signed)
Pts CBG [phone]

## 2015-11-16 NOTE — ED Notes (Signed)
Phlebotomy at bedside.

## 2015-11-16 NOTE — H&P (Signed)
[facility] History and Physical  Paige Hardin [id number] DOB: October 12, 1946 DOA: 11/16/2015  Referring physician: ED PCP: Stephens Shire, MD   Chief Complaint: Shortness of breath  HPI:  Patient is a 69 year old female with a past medical history significant for hypertension, atrial fibrillation on Eliquis, hyperlipidemia, diabetes mellitus type 2, diastolic congestive heart failure, COPD oxygen dependent 2 L of nasal cannula oxygen; who presents with complaints of shortness of breath. Symptoms initially started approximately 1 week ago with a productive cough with Castrejon sputum. Associated symptoms included low-grade fever of 99.22F, wheezing, nasal congestion, postnasal drip, lower leg swelling. Patient reports that she's been coughing so much that her ribs have begun to hurt. She was not able to try anything at the nursing facility except for Robitussin as a doctor has to prescribe it for her to receive any additional medications. Symptoms progressively worsened as the week went on and the patient required more and more oxygen to maintain for O2 saturations. Patient notes that she comes from [place] facility where a GI bug has been going around. Patient denies any diarrhea or abdominal pain symptoms.  Upon admission to the emergency department patient was seen have WBC count of 18.6, elevated blood glucose of 499, anion gap of 10, BMP of 166, chest x-ray revealing pulmonary congestion.   Review of Systems  Constitutional: Positive for fever and malaise/fatigue. Negative for chills.  HENT: Negative for ear pain and tinnitus.   Eyes: Negative for photophobia and pain.  Respiratory: Positive for cough, sputum production, shortness of breath and wheezing. Negative for hemoptysis.   Cardiovascular: Positive for leg swelling. Negative for chest pain.       Rib discomfort from coughing  Gastrointestinal: Negative for vomiting and abdominal pain.  Genitourinary: Positive for  frequency. Negative for urgency.  Musculoskeletal: Positive for joint pain. Negative for falls.  Neurological: Negative for speech change and focal weakness.  Endo/Heme/Allergies: Positive for polydipsia. Bruises/bleeds easily.  Psychiatric/Behavioral: Negative for suicidal ideas and substance abuse.       Past Medical History  Diagnosis Date  . Chronic low back pain   . Emphysema   . Paroxysmal atrial fibrillation (HCC)     a. failed flecainide;  b. 03/2014 amio started->DCCV;  c. Chronic pradaxa.  Marland Kitchen COPD (chronic obstructive pulmonary disease) ([place])   . Hyperlipidemia   . Diastolic congestive heart failure ([place])   . Obese   . Carotid atherosclerosis   . Lung nodules   . Hoarseness, chronic   . Pneumonia 1990's    "once"  . Type 2 diabetes mellitus ([place])   . GERD (gastroesophageal reflux disease)   . Arthritis     "joints" (01/14/2014)  . Depression     "maybe" (01/14/2014  . Excessive daytime sleepiness 09/08/2015     Past Surgical History  Procedure Laterality Date  . Esophagogastroduodenoscopy  01/26/2012    Procedure: ESOPHAGOGASTRODUODENOSCOPY (EGD);  Surgeon: Beryle Beams, MD;  Location: [facility];  Service: Endoscopy;  Laterality: N/A;  . Colonoscopy  01/26/2012    Procedure: COLONOSCOPY;  Surgeon: Beryle Beams, MD;  Location: [facility];  Service: Endoscopy;  Laterality: N/A;  . Breast biopsy Left ~ 1980    "solid"  . Breast lumpectomy Left ~ 1980    "removed a mass; it was benign" (01/14/2014)  . Total abdominal hysterectomy  1994  . Cardioversion N/A 03/11/2014    Procedure: CARDIOVERSION;  Surgeon: Sanda Klein, MD;  Location: [facility];  Service: Cardiovascular;  Laterality: N/A;  .  Cardioversion N/A 03/27/2014    Procedure: CARDIOVERSION;  Surgeon: Darlin Coco, MD;  Location: [facility] OR;  Service: Cardiovascular;  Laterality: N/A;      Social History:  reports that she quit smoking about 6 years ago. Her smoking use included Cigarettes. She started  smoking about 52 years ago. She has a 135 pack-year smoking history. She has never used smokeless tobacco. She reports that she drinks alcohol. She reports that she does not use illicit drugs. Where does patient live SNF   Allergies  Allergen Reactions  . Ace Inhibitors Cough  . Clindamycin Rash    Family History  Problem Relation Age of Onset  . Breast cancer Sister   . Heart disease    . Hodgkin's lymphoma    . Asthma Mother       Prior to Admission medications   Medication Sig Start Date End Date Taking? Authorizing Provider  albuterol (PROVENTIL) (2.5 MG/3ML) 0.083% nebulizer solution Take 2.5 mg by nebulization every 6 (six) hours as needed for wheezing or shortness of breath.   Yes Historical Provider, MD  allopurinol (ZYLOPRIM) 100 MG tablet Take 1 tablet (100 mg total) by mouth daily. 05/20/15  Yes Larey Dresser, MD  amiodarone (PACERONE) 200 MG tablet Take 1 tablet (200 mg total) by mouth daily. 04/23/14  Yes Liliane Shi, PA-C  apixaban (ELIQUIS) 5 MG TABS tablet Take 1 tablet (5 mg total) by mouth 2 (two) times daily. 04/03/14  Yes Grace Bushy Minor, NP  Ascorbic Acid (VITAMIN C) 500 MG CAPS Take 500 mg by mouth daily.    Yes Historical Provider, MD  budesonide-formoterol (SYMBICORT) 160-4.5 MCG/ACT inhaler Inhale 2 puffs into the lungs 2 (two) times daily. 05/18/15  Yes Collene Gobble, MD  calcium-vitamin D (OSCAL WITH D) 500-200 MG-UNIT per tablet Take 1 tablet by mouth daily with breakfast.   Yes Historical Provider, MD  Cholecalciferol (VITAMIN D) 2000 UNITS tablet Take 2,000 Units by mouth daily.   Yes Historical Provider, MD  diltiazem (CARDIZEM CD) 180 MG 24 hr capsule Take 1 capsule (180 mg total) by mouth daily. 03/12/14  Yes Brooke O Edmisten, PA-C  ezetimibe-simvastatin (VYTORIN) 10-40 MG per tablet Take 1 tablet by mouth at bedtime.    Yes Historical Provider, MD  ferrous sulfate 325 (65 FE) MG tablet Take 325 mg by mouth 2 (two) times daily with a meal.    Yes  Historical Provider, MD  furosemide (LASIX) 80 MG tablet Take 2 tablets (160 mg total) by mouth 2 (two) times daily. MAKE SURE TO TAKE AT 8 AM AND 2 PM 04/23/14  Yes Liliane Shi, PA-C  glimepiride (AMARYL) 2 MG tablet Take 2 mg by mouth daily with breakfast.  03/18/15  Yes Historical Provider, MD  guaiFENesin (DIABETIC TUSSIN EX) 100 MG/5ML liquid Take 200 mg by mouth 3 (three) times daily as needed for cough.   Yes Historical Provider, MD  insulin aspart (NOVOLOG) 100 UNIT/ML injection Inject 3 Units into the skin 2 (two) times daily at 10 am and 4 pm. 04/03/14  Yes Grace Bushy Minor, NP  metolazone (ZAROXOLYN) 2.5 MG tablet Take 1 tablet only if weight is greater or equal to 300 lbs as needed 07/13/15  Yes Jolaine Artist, MD  metoprolol (LOPRESSOR) 50 MG tablet Take 1 tablet (50 mg total) by mouth 2 (two) times daily. 03/12/14  Yes Brooke O Edmisten, PA-C  Multiple Vitamins-Iron (MULTIVITAMIN/IRON PO) Take 1 tablet by mouth daily.   Yes Historical Provider, MD  omeprazole (PRILOSEC) 20 MG capsule Take 20 mg by mouth daily.   Yes Historical Provider, MD  potassium chloride SA (K-DUR,KLOR-CON) 20 MEQ tablet Take 20 mEq by mouth 3 (three) times daily. MAKE SURE TO TAKE EXTRA 20 MEQ ON METOLAZONE DAYS 04/23/14  Yes Scott Joylene Draft, PA-C  potassium chloride SA (K-DUR,KLOR-CON) 20 MEQ tablet Take 40 mEq by mouth See admin instructions. Every Wednesday   Yes Historical Provider, MD  PROAIR HFA 108 (90 BASE) MCG/ACT inhaler Inhale 2 puffs into the lungs every 6 (six) hours as needed for wheezing.  05/14/14  Yes Historical Provider, MD  sitaGLIPtin (JANUVIA) 100 MG tablet Take 100 mg by mouth daily.   Yes Historical Provider, MD  tiotropium (SPIRIVA) 18 MCG inhalation capsule Place 1 capsule (18 mcg total) into inhaler and inhale daily. 05/18/15  Yes Collene Gobble, MD  vitamin B-12 (CYANOCOBALAMIN) 500 MCG tablet Take 500 mcg by mouth daily.   Yes Historical Provider, MD  acetaminophen (TYLENOL) 325 MG tablet  Take 325 mg by mouth daily as needed for mild pain.     Historical Provider, MD     Physical Exam: Filed Vitals:   11/16/15 0330 11/16/15 0400 11/16/15 0430 11/16/15 0500  BP: 115/48 123/49 123/50 118/40  Pulse: 93 94 94 93  Temp:      TempSrc:      Resp: 20 15 20 21   Height:      Weight:      SpO2: 92% 94% 95% 96%     Constitutional: Vital signs reviewed. Patient is a  obese female and mild distress, but not toxic appearing. Head: Normocephalic and atraumatic  Ear: TM normal bilaterally  Mouth: no erythema or exudates, MMM  Eyes: PERRL, EOMI, conjunctivae normal, No scleral icterus.  Neck: Supple, Trachea midline normal ROM, No JVD, mass, thyromegaly, or carotid bruit present.  Cardiovascular: RRR, S1 normal, S2 normal, +SEM pulses symmetric and intact bilaterally  Pulmonary/Chest:  Decreased overall air movement positive expiratory wheezes and rales. Abdominal: Soft. Non-tender, non-distended, bowel sounds are normal, no masses, organomegaly, or guarding present.  GU: no CVA tenderness Musculoskeletal: No joint deformities, erythema, or stiffness, ROM full and no nontender Ext: +1-2 pitting edema of the bilateral lower extremities to the knee. and no cyanosis, pulses palpable bilaterally (DP and PT)  Hematology: no cervical, inginal, or axillary adenopathy.  Neurological: A&O x3, Strenght is normal and symmetric bilaterally, cranial nerve II-XII are grossly intact, no focal motor deficit, sensory intact to light touch bilaterally.  Skin: Warm, dry and intact. No rash, cyanosis, or clubbing.  Psychiatric: Normal mood and affect. speech and behavior is normal. Judgment and thought content normal. Cognition and memory are normal.      Data Review   Micro Results No results found for this or any previous visit (from the past 240 hour(s)).  Radiology Reports Dg Chest Portable 1 View  11/16/2015  CLINICAL DATA:  Shortness of breath with wheezing. EXAM: PORTABLE CHEST 1 VIEW  COMPARISON:  10/03/2015 FINDINGS: Chronic cardiomegaly with pulmonary venous congestion including cephalized blood flow. No edema, effusion, or pneumothorax. Stable aortic and hilar contours. A right lower lobe pulmonary nodule seen on chest CT 10/07/2015 is not visualized. IMPRESSION: Chronic cardiomegaly with pulmonary venous congestion. Electronically Signed   By: Monte Fantasia M.D.   On: 11/16/2015 02:39     CBC  Recent Labs Lab 11/16/15 0246  WBC 18.6*  HGB 11.5*  HCT 35.7*  PLT 247  MCV 94.2  MCH 30.3  MCHC 32.2  RDW 15.1  LYMPHSABS 1.1  MONOABS 0.8  EOSABS 0.1  BASOSABS 0.0    Chemistries   Recent Labs Lab 11/16/15 0246  NA 136  K 4.5  CL 95*  CO2 31  GLUCOSE 439*  BUN 34*  CREATININE 1.47*  CALCIUM 9.4   ------------------------------------------------------------------------------------------------------------------ estimated creatinine clearance is 50.3 mL/min (by C-G formula based on Cr of 1.47). ------------------------------------------------------------------------------------------------------------------ No results for input(s): HGBA1C in the last 72 hours. ------------------------------------------------------------------------------------------------------------------ No results for input(s): CHOL, HDL, LDLCALC, TRIG, CHOLHDL, LDLDIRECT in the last 72 hours. ------------------------------------------------------------------------------------------------------------------ No results for input(s): TSH, T4TOTAL, T3FREE, THYROIDAB in the last 72 hours.  Invalid input(s): FREET3 ------------------------------------------------------------------------------------------------------------------ No results for input(s): VITAMINB12, FOLATE, FERRITIN, TIBC, IRON, RETICCTPCT in the last 72 hours.  Coagulation profile No results for input(s): INR, PROTIME in the last 168 hours.  No results for input(s): DDIMER in the last 72 hours.  Cardiac  Enzymes  Recent Labs Lab 11/16/15 0246  TROPONINI <0.03   ------------------------------------------------------------------------------------------------------------------ Invalid input(s): POCBNP   CBG:  Recent Labs Lab 11/16/15 0425 11/16/15 0457  GLUCAP 408* 366*       EKG: Independently reviewed.    Assessment/Plan Principal Problem:    Suspected COPD with acute exacerbation: Patient with acute onset of wheezing shortness of breath, coughing, and increased oxygen requirements. Suspect this could be acute COPD exacerbation versus bronchitis. Patient given 60 mg of by mouth prednisone in the emergency department. - Albuterol scheduled every 6 hours and when necessary every 2 hours - Continue medications of  Spiriva - Changed Symbicort inhaler to Pulmicort and Brovana nebs  - Reassess patient condition to see systemic steroids needed  Suspected community-acquired pneumonia: Given patient's acute elevated leukocytosis of 18.6 with low-grade fever and productive cough gives concern for the possibility of an underlying pneumonia. Chest x-ray showing pulmonary congestion and poor inspiratory effort. Patient lives in a skilled nursing facility and have initially placed on community-acquired pneumonia antibiotics initially has not very certain the patient indeed has a pneumonia. - empiric antibiotics of doxycycline and Rocephin  - Sputum cultures and Gram stain  - Urine legionella and strep  - Mucinex and Tessalon Perles as needed forcongestion and cough respectively  PAF (paroxysmal atrial fibrillation) s/p multiple DCCVs; on Amiodarone: Rate Controlled on amiodarone. Patient has a chadsvasc score of 5 and is on chronic anticoagulation of Eliquis. - Continue Eliquis, amiodarone, diltiazem  Chronic congestive heart failure:  last echocardiogram in 03/2015 showing EF of 55-60% with no wall motion abnormalities. Patient presents with a mildly elevated BNP of 166. Chest x-ray poor  inspiratory effort showing signs of vascular congestion - Daily weights - Metolazone as needed for weights greater than 300 per home regimen  Hypertension: Stable -Continue metoprolol, furosemide, and other meds as seen above  Diabetes mellitus type 2 with hyperglycemia. Patient found to have a blood glucose on admission of 499 no anion gap. Patient's last hemoglobin A1c was noted to be 8 back in August. -Held oral hypoglycemic agents - CBGs every 4 hours with sliding scale insulin -check hbga1c  Chronic renal disease, stage III: Stable. Creatinine appears to be at baseline around 1.5     Iron deficiency anemia:Hemoglobin at 11.5g/dL and this appear near patient's last admission in October. -Continue home medication of [place].  Hyperlipidemia -Continue atorvastatin  Morbid obesity- BMI 50   Code Status:   full Family Communication: bedside Disposition Plan: admit   Total time spent 55 minutes.Greater than 50% of this time was spent in counseling,  explanation of diagnosis, planning of further management, and coordination of care  [facility] Pager [phone]  If 7PM-7AM, please contact night-coverage [email or web address] Password [email or web address] 11/16/2015, 5:22 AM

## 2015-11-16 NOTE — ED Notes (Signed)
The pt is here by ems from [place].  She is very sob mod resp distress.  She was a smoker until 2010. She has wheezes all lung fields.  She is also c/o bi-lateral jaw pain that started today.  Hard of hearing

## 2015-11-16 NOTE — ED Provider Notes (Addendum)
CSN: [id number]     Arrival date & time 11/16/15  0118 History   By signing my name below, I, Forrestine Him, attest that this documentation has been prepared under the direction and in the presence of Varney Biles, MD.  Electronically Signed: Forrestine Him, ED Scribe. 11/16/2015. 2:06 AM.   Chief Complaint  Patient presents with  . Shortness of Breath  . Hyperglycemia   The history is provided by the patient. No language interpreter was used.    HPI Comments: RAYNNA HOLVECK brought in by EMS from [place] is a 69 y.o. female with a PMHx of COPD, hyperlipemia, CHF, and DM who presents to the Emergency Department complaining of constant, ongoing shortness of breath x 1 week. Pt denies waking in the middle of the night short of breath. She also reports a productive cough consisting of [place] mucous, increased congestion, bilateral lower extremity swelling, bilateral rib soreness, and fever of 99.9 this evening. At home Albuterol attempted prior to calling EMS without any improvement. She denies any fever, chills, or chest pain. Oxygen was increased to 4 liters en route to department. Pt is on 2 liters of oxygen [place] at home and usually runs around 95% - currently she is 90-92%. No prior history of blood clots. She is not an every day smoker. Pt states she received both the Flu vaccine and PNA vaccine.  PCP: Stephens Shire, MD    Past Medical History  Diagnosis Date  . Chronic low back pain   . Emphysema   . Paroxysmal atrial fibrillation (HCC)     a. failed flecainide;  b. 03/2014 amio started->DCCV;  c. Chronic pradaxa.  Marland Kitchen COPD (chronic obstructive pulmonary disease) ([place])   . Hyperlipidemia   . Diastolic congestive heart failure ([place])   . Obese   . Carotid atherosclerosis   . Lung nodules   . Hoarseness, chronic   . Pneumonia 1990's    "once"  . Type 2 diabetes mellitus ([place])   . GERD (gastroesophageal reflux disease)   . Arthritis     "joints"  (01/14/2014)  . Depression     "maybe" (01/14/2014  . Excessive daytime sleepiness 09/08/2015   Past Surgical History  Procedure Laterality Date  . Esophagogastroduodenoscopy  01/26/2012    Procedure: ESOPHAGOGASTRODUODENOSCOPY (EGD);  Surgeon: Beryle Beams, MD;  Location: [facility];  Service: Endoscopy;  Laterality: N/A;  . Colonoscopy  01/26/2012    Procedure: COLONOSCOPY;  Surgeon: Beryle Beams, MD;  Location: [facility];  Service: Endoscopy;  Laterality: N/A;  . Breast biopsy Left ~ 1980    "solid"  . Breast lumpectomy Left ~ 1980    "removed a mass; it was benign" (01/14/2014)  . Total abdominal hysterectomy  1994  . Cardioversion N/A 03/11/2014    Procedure: CARDIOVERSION;  Surgeon: Sanda Klein, MD;  Location: [facility];  Service: Cardiovascular;  Laterality: N/A;  . Cardioversion N/A 03/27/2014    Procedure: CARDIOVERSION;  Surgeon: Darlin Coco, MD;  Location: [facility] OR;  Service: Cardiovascular;  Laterality: N/A;   Family History  Problem Relation Age of Onset  . Breast cancer Sister   . Heart disease    . Hodgkin's lymphoma    . Asthma Mother    Social History  Substance Use Topics  . Smoking status: Former Smoker -- 3.00 packs/day for 45 years    Types: Cigarettes    Start date: 11/08/1963    Quit date: 12/28/2008  . Smokeless tobacco: Never Used  Comment: quit smoking 5 years ago  . Alcohol Use: Yes     Comment: 01/14/2014 "used to drink socially in the past; last drink was probably 10 yr ago"   OB History    No data available     Review of Systems  Constitutional: Negative for chills.  HENT: Positive for congestion.   Respiratory: Positive for cough and shortness of breath.   Cardiovascular: Positive for leg swelling. Negative for chest pain.  Gastrointestinal: Negative for nausea, vomiting and abdominal pain.  Musculoskeletal: Negative for back pain.  Skin: Negative for rash.  Neurological: Negative for headaches.  Psychiatric/Behavioral: Negative for  confusion.  All other systems reviewed and are negative.     Allergies  Ace inhibitors and Clindamycin  Home Medications   Prior to Admission medications   Medication Sig Start Date End Date Taking? Authorizing Provider  albuterol (PROVENTIL) (2.5 MG/3ML) 0.083% nebulizer solution Take 2.5 mg by nebulization every 6 (six) hours as needed for wheezing or shortness of breath.   Yes Historical Provider, MD  allopurinol (ZYLOPRIM) 100 MG tablet Take 1 tablet (100 mg total) by mouth daily. 05/20/15  Yes Larey Dresser, MD  amiodarone (PACERONE) 200 MG tablet Take 1 tablet (200 mg total) by mouth daily. 04/23/14  Yes Liliane Shi, PA-C  apixaban (ELIQUIS) 5 MG TABS tablet Take 1 tablet (5 mg total) by mouth 2 (two) times daily. 04/03/14  Yes Grace Bushy Minor, NP  Ascorbic Acid (VITAMIN C) 500 MG CAPS Take 500 mg by mouth daily.    Yes Historical Provider, MD  budesonide-formoterol (SYMBICORT) 160-4.5 MCG/ACT inhaler Inhale 2 puffs into the lungs 2 (two) times daily. 05/18/15  Yes Collene Gobble, MD  calcium-vitamin D (OSCAL WITH D) 500-200 MG-UNIT per tablet Take 1 tablet by mouth daily with breakfast.   Yes Historical Provider, MD  Cholecalciferol (VITAMIN D) 2000 UNITS tablet Take 2,000 Units by mouth daily.   Yes Historical Provider, MD  diltiazem (CARDIZEM CD) 180 MG 24 hr capsule Take 1 capsule (180 mg total) by mouth daily. 03/12/14  Yes Brooke O Edmisten, PA-C  ezetimibe-simvastatin (VYTORIN) 10-40 MG per tablet Take 1 tablet by mouth at bedtime.    Yes Historical Provider, MD  ferrous sulfate 325 (65 FE) MG tablet Take 325 mg by mouth 2 (two) times daily with a meal.    Yes Historical Provider, MD  furosemide (LASIX) 80 MG tablet Take 2 tablets (160 mg total) by mouth 2 (two) times daily. MAKE SURE TO TAKE AT 8 AM AND 2 PM 04/23/14  Yes Liliane Shi, PA-C  glimepiride (AMARYL) 2 MG tablet Take 2 mg by mouth daily with breakfast.  03/18/15  Yes Historical Provider, MD  guaiFENesin (DIABETIC  TUSSIN EX) 100 MG/5ML liquid Take 200 mg by mouth 3 (three) times daily as needed for cough.   Yes Historical Provider, MD  insulin aspart (NOVOLOG) 100 UNIT/ML injection Inject 3 Units into the skin 2 (two) times daily at 10 am and 4 pm. 04/03/14  Yes Grace Bushy Minor, NP  metolazone (ZAROXOLYN) 2.5 MG tablet Take 1 tablet only if weight is greater or equal to 300 lbs as needed 07/13/15  Yes Jolaine Artist, MD  metoprolol (LOPRESSOR) 50 MG tablet Take 1 tablet (50 mg total) by mouth 2 (two) times daily. 03/12/14  Yes Brooke O Edmisten, PA-C  Multiple Vitamins-Iron (MULTIVITAMIN/IRON PO) Take 1 tablet by mouth daily.   Yes Historical Provider, MD  omeprazole (PRILOSEC) 20 MG capsule Take  20 mg by mouth daily.   Yes Historical Provider, MD  potassium chloride SA (K-DUR,KLOR-CON) 20 MEQ tablet Take 20 mEq by mouth 3 (three) times daily. MAKE SURE TO TAKE EXTRA 20 MEQ ON METOLAZONE DAYS 04/23/14  Yes Scott Joylene Draft, PA-C  potassium chloride SA (K-DUR,KLOR-CON) 20 MEQ tablet Take 40 mEq by mouth See admin instructions. Every Wednesday   Yes Historical Provider, MD  PROAIR HFA 108 (90 BASE) MCG/ACT inhaler Inhale 2 puffs into the lungs every 6 (six) hours as needed for wheezing.  05/14/14  Yes Historical Provider, MD  sitaGLIPtin (JANUVIA) 100 MG tablet Take 100 mg by mouth daily.   Yes Historical Provider, MD  tiotropium (SPIRIVA) 18 MCG inhalation capsule Place 1 capsule (18 mcg total) into inhaler and inhale daily. 05/18/15  Yes Collene Gobble, MD  vitamin B-12 (CYANOCOBALAMIN) 500 MCG tablet Take 500 mcg by mouth daily.   Yes Historical Provider, MD  acetaminophen (TYLENOL) 325 MG tablet Take 325 mg by mouth daily as needed for mild pain.     Historical Provider, MD   Triage Vitals: BP 123/50 mmHg  Pulse 94  Temp(Src) 100.3 F (37.9 C) (Rectal)  Resp 20  Ht 5' 5.5" (1.664 m)  Wt 300 lb (136.079 kg)  BMI 49.15 kg/m2  SpO2 95%   Physical Exam  Constitutional: She is oriented to person, place, and  time. She appears well-developed and well-nourished. No distress.  HENT:  Head: Normocephalic and atraumatic.  Eyes: EOM are normal.  Neck: Normal range of motion.  Cardiovascular: Normal rate and regular rhythm.   Murmur heard. Systolic murmur noted  Pulmonary/Chest: She has wheezes. She has rales.  Positive distended neck vein  Bibasilar rales with expiratory wheezing on the R side  Abdominal: Soft. She exhibits no distension. There is no tenderness.  Musculoskeletal: Normal range of motion. She exhibits edema and tenderness.  No unilateral leg swelling 1 plus pitting edema bilaterally  Calf tenderness bilaterally with palpation   Lymphadenopathy:    She has no cervical adenopathy.  Neurological: She is alert and oriented to person, place, and time.  Skin: Skin is warm and dry.  Psychiatric: She has a normal mood and affect. Judgment normal.  Nursing note and vitals reviewed.   ED Course  Procedures (including critical care time)  DIAGNOSTIC STUDIES: Oxygen Saturation is 97% on RA, adequate by my interpretation.    COORDINATION OF CARE: 1:50 AM- Will give breathing treatment. Will order CXR and EKG. Discussed treatment plan with pt at bedside and pt agreed to plan.     Labs Review Labs Reviewed  CBC WITH DIFFERENTIAL/PLATELET - Abnormal; Notable for the following:    WBC 18.6 (*)    RBC 3.79 (*)    Hemoglobin 11.5 (*)    HCT 35.7 (*)    Neutro Abs 16.7 (*)    All other components within normal limits  BASIC METABOLIC PANEL - Abnormal; Notable for the following:    Chloride 95 (*)    Glucose, Bld 439 (*)    BUN 34 (*)    Creatinine, Ser 1.47 (*)    GFR calc non Af Amer 35 (*)    GFR calc Af Amer 41 (*)    All other components within normal limits  BRAIN NATRIURETIC PEPTIDE - Abnormal; Notable for the following:    B Natriuretic Peptide 166.6 (*)    All other components within normal limits  URINALYSIS, ROUTINE W REFLEX MICROSCOPIC (NOT AT [facility]) - Abnormal;  Notable for  the following:    Glucose, UA >1000 (*)    Hgb urine dipstick TRACE (*)    Nitrite POSITIVE (*)    All other components within normal limits  URINE MICROSCOPIC-ADD ON - Abnormal; Notable for the following:    Squamous Epithelial / LPF 0-5 (*)    Bacteria, UA MANY (*)    All other components within normal limits  CBG MONITORING, ED - Abnormal; Notable for the following:    Glucose-Capillary 408 (*)    All other components within normal limits  URINE CULTURE  CULTURE, BLOOD (ROUTINE X 2)  CULTURE, BLOOD (ROUTINE X 2)  TROPONIN I  I-STAT CG4 LACTIC ACID, ED  CBG MONITORING, ED    Imaging Review Dg Chest Portable 1 View  11/16/2015  CLINICAL DATA:  Shortness of breath with wheezing. EXAM: PORTABLE CHEST 1 VIEW COMPARISON:  10/03/2015 FINDINGS: Chronic cardiomegaly with pulmonary venous congestion including cephalized blood flow. No edema, effusion, or pneumothorax. Stable aortic and hilar contours. A right lower lobe pulmonary nodule seen on chest CT 10/07/2015 is not visualized. IMPRESSION: Chronic cardiomegaly with pulmonary venous congestion. Electronically Signed   By: Monte Fantasia M.D.   On: 11/16/2015 02:39   I have personally reviewed and evaluated these images and lab results as part of my medical decision-making.   EKG Interpretation   Date/Time:  Tuesday November 16 2015 01:41:39 EST Ventricular Rate:  91 PR Interval:  125 QRS Duration: 168 QT Interval:  434 QTC Calculation: 534 R Axis:   -5 Text Interpretation:  Sinus rhythm Right bundle branch block rbbb is new  new change as above Confirmed by Danyia Borunda, MD, Thelma Comp [phone]) on 11/16/2015  1:48:52 AM      MDM   Final diagnoses:  COPD with acute exacerbation (HCC)  Acute diastolic congestive heart failure ([place])    I personally performed the services described in this documentation, which was scribed in my presence. The recorded information has been reviewed and is accurate.   Pt comes in with cc  of dib. Hx of CHF, COPD. She is noted to have increased O2 requirement. Lung exam reveals some expiratory wheez and basilar rales, and L sided rhonchi. Pt also has low grade fever with increase RR and her WC is 18.  Clinical impression is COPD exacerbation due to viral infection/bacterial infection. CXR is read as clear, but i think it is equivocal for infection given the clinical setting we see ourselves in. Pt has CURB65 score of 1 and PSI score is class III - but with new O2 reqd and the other comorbidities and likely multifactorial etiology - we will admit the patient.  Lactate ordered - and is < 2. Pt has no hard risk factors for drug resistant bacteria in case this is a PNA - so she received levaquin and doxy. Breathing tx and steroids given as well.  CXR also shows pulm congestion and pleural effusion, but pt has no orthopnea. Suspect mild element of CHF present as well.  We were not generous with fluids given the mixed picture - and hospitalist can give fluids or give lasix depending on how pt responds to tx here.  Finally, there is a RBBB that is new - but likely progression of the right sided heart strain/cor pulmonale.  @4 :45 Repeat exam reveals clearing of wheezing in all lung fields. Patient is not in any respiratory distress.     Varney Biles, MD 11/16/15 [place], MD 11/16/15 [phone]

## 2015-11-16 NOTE — ED Notes (Signed)
Breakfast Tray (heart healthy) ordered @ 0510.

## 2015-11-16 NOTE — ED Notes (Signed)
Given water per pt request. RN notified.

## 2015-11-16 NOTE — ED Notes (Signed)
Pt alert  Her breathing is much better.  Still gets exertionally sob.  We just checked her rectal temp and the exertion  Made her breathing worse

## 2015-11-16 NOTE — ED Notes (Signed)
Pts CBG 408

## 2015-11-16 NOTE — ED Notes (Signed)
Dr. Tamala Julian, admitting at bedside

## 2015-11-16 NOTE — ED Notes (Signed)
pts CBG 366

## 2015-11-16 NOTE — Progress Notes (Signed)
Patient admitted earlier this morning. H&P reviewed. Patient seen and examined in the emergency department.  Patient feels better after she has received nebulizer treatments. Not as short as breathless. She was last night.  Vital signs are stable  Lungs revealed few scattered wheezes. No crackles. Diminished air entry at the bases S1, S2 is irregularly irregular. No S3, S4.  Abdomen is soft, nontender, nondistended Minimal pedal edema bilateral lower extremities Awake and alert. Oriented 3. No focal neurological deficits  CBGs noted to be elevated. HbA1c is pending.  Patient seems to be improving with treatment as outlined in the H&P. Continue current management. Assess need for continued steroids tomorrow morning. Blood sugars noted to be significantly elevated. Increase sliding scale to resistant coverage. Add Lantus.  Rest as per H&P  We'll continue to follow  [facility] 11/16/2015

## 2015-11-16 NOTE — ED Notes (Signed)
Patient to ED via [facility] c/o SOB and hyperglycemia. Patient hx COPD and CHF, is on 2L O2 [place] at home, usually runs between 92-95% on 2L. EMS bumped her up to 4L and increased initial SpO2 of 87% to 95%. Patient states she has had a cough for a few days, increased congestion, and fever of 100F past day or two, also states SOB worsened with exertion. HR 87, CBG 593.  Patient took 2 puffs albuterol at home before calling EMS - EMS gave duoneb.  Respirations decreased lower lobes, labored at this time. A&O x 4.

## 2015-11-17 DIAGNOSIS — D509 Iron deficiency anemia, unspecified: Secondary | ICD-10-CM

## 2015-11-17 DIAGNOSIS — N183 Chronic kidney disease, stage 3 (moderate): Secondary | ICD-10-CM

## 2015-11-17 DIAGNOSIS — Z7901 Long term (current) use of anticoagulants: Secondary | ICD-10-CM

## 2015-11-17 DIAGNOSIS — J441 Chronic obstructive pulmonary disease with (acute) exacerbation: Secondary | ICD-10-CM

## 2015-11-17 DIAGNOSIS — E785 Hyperlipidemia, unspecified: Secondary | ICD-10-CM

## 2015-11-17 LAB — BASIC METABOLIC PANEL
ANION GAP: 12 (ref 5–15)
BUN: 33 mg/dL — AB (ref 6–20)
CHLORIDE: 97 mmol/L — AB (ref 101–111)
CO2: 31 mmol/L (ref 22–32)
Calcium: 9.2 mg/dL (ref 8.9–10.3)
Creatinine, Ser: 1.34 mg/dL — ABNORMAL HIGH (ref 0.44–1.00)
GFR, EST AFRICAN AMERICAN: 45 mL/min — AB (ref >60)
GFR, EST NON AFRICAN AMERICAN: 39 mL/min — AB (ref >60)
Glucose, Bld: 213 mg/dL — ABNORMAL HIGH (ref 65–99)
POTASSIUM: 4.4 mmol/L (ref 3.5–5.1)
SODIUM: 140 mmol/L (ref 135–145)

## 2015-11-17 LAB — LEGIONELLA ANTIGEN, URINE

## 2015-11-17 LAB — CBC
HEMATOCRIT: 34.3 % — AB (ref 36.0–46.0)
HEMOGLOBIN: 11 g/dL — AB (ref 12.0–15.0)
MCH: 30.6 pg (ref 26.0–34.0)
MCHC: 32.1 g/dL (ref 30.0–36.0)
MCV: 95.3 fL (ref 78.0–100.0)
Platelets: 239 10*3/uL (ref 150–400)
RBC: 3.6 MIL/uL — AB (ref 3.87–5.11)
RDW: 15.5 % (ref 11.5–15.5)
WBC: 13.1 10*3/uL — AB (ref 4.0–10.5)

## 2015-11-17 LAB — GLUCOSE, CAPILLARY
GLUCOSE-CAPILLARY: 173 mg/dL — AB (ref 65–99)
GLUCOSE-CAPILLARY: 209 mg/dL — AB (ref 65–99)
GLUCOSE-CAPILLARY: 224 mg/dL — AB (ref 65–99)
GLUCOSE-CAPILLARY: 244 mg/dL — AB (ref 65–99)
Glucose-Capillary: 169 mg/dL — ABNORMAL HIGH (ref 65–99)
Glucose-Capillary: 210 mg/dL — ABNORMAL HIGH (ref 65–99)
Glucose-Capillary: 291 mg/dL — ABNORMAL HIGH (ref 65–99)

## 2015-11-17 LAB — HEMOGLOBIN A1C
Hgb A1c MFr Bld: 9.7 % — ABNORMAL HIGH (ref 4.8–5.6)
Mean Plasma Glucose: 232 mg/dL

## 2015-11-17 MED ORDER — INSULIN ASPART 100 UNIT/ML ~~LOC~~ SOLN
4.0000 [IU] | Freq: Three times a day (TID) | SUBCUTANEOUS | Status: DC
  Administered 2015-11-17 – 2015-11-19 (×6): 4 [IU] via SUBCUTANEOUS

## 2015-11-17 NOTE — NC FL2 (Signed)
[place] LEVEL OF CARE SCREENING TOOL     IDENTIFICATION  Patient Name: Paige Hardin Birthdate: 10/23/1946 Sex: female Admission Date (Current Location): 11/16/2015  [facility] [facility] and [place] Number:  Herbalist and Address:  [facility], [place] [address], [place], [place] [address]      Provider Number: [id number]  Attending Physician Name and Address:  Mendel Corning, MD  Relative Name and Phone Number:       Current Level of Care: Hospital Recommended Level of Care: [place] Prior Approval Number:    Date Approved/Denied:   PASRR Number:    Discharge Plan: Other (Comment) (Assisted Living)    Current Diagnoses: Patient Active Problem List   Diagnosis Date Noted  . Acute exacerbation of COPD with asthma ([place]) 11/16/2015  . COPD with acute exacerbation ([place]) 11/16/2015  . Type 2 diabetes mellitus with hyperglycemia ([place]) 11/16/2015  . Hyperlipidemia 11/16/2015  . Excessive daytime sleepiness 09/08/2015  . Hemoptysis 03/30/2014  . Iron deficiency anemia 03/29/2014  . Atrial fibrillation with RVR- DCCV 03/27/14 03/25/2014  . Acute respiratory failure with hypoxia ([place]) 03/23/2014  . COPD exacerbation ([place]) 02/24/2014  . Altered mental status 02/24/2014  . Pulmonary edema 02/24/2014  . HTN (hypertension) 01/21/2014  . Chronic anticoagulation 01/21/2014  . Pulmonary HTN- pa 53 mmHg Echo 01/15/14 01/21/2014  . Morbid obesity- BMI 50 01/21/2014  . Poorly controlled diabetes mellitus ([place]) 01/21/2014  . Chronic renal disease, stage III 01/21/2014  . Chronic diastolic heart failure ([place]) 01/14/2014  . CHEST PAIN 09/26/2010  . HYPERLIPIDEMIA-MIXED 05/14/2009  . HOARSENESS 05/14/2009  . CAROTID BRUIT 05/14/2009  . COPD with emphysema  05/05/2009  . PAF (paroxysmal atrial fibrillation) s/p multiple DCCVs; on Amiodarone 05/05/2009    Orientation RESPIRATION BLADDER Height & Weight    Self, Time, Situation, Place   O2 (4L) Continent 5\' 5"  (165.1 cm) 300 lbs.  BEHAVIORAL SYMPTOMS/MOOD NEUROLOGICAL BOWEL NUTRITION STATUS      Continent Diet (Heart Healthy / Carb Modified)  AMBULATORY STATUS COMMUNICATION OF NEEDS Skin   Supervision Verbally Normal                       Personal Care Assistance Level of Assistance  Bathing, Feeding, Dressing Bathing Assistance: Limited assistance Feeding assistance: Independent Dressing Assistance: Limited assistance     Functional Limitations Info  Sight, Hearing, Speech Sight Info: Adequate Hearing Info: Adequate Speech Info: Adequate    SPECIAL CARE FACTORS FREQUENCY  PT (By licensed PT), OT (By licensed OT)     PT Frequency: 2 OT Frequency: 2            Contractures Contractures Info: Not present    Additional Factors Info  Code Status, Allergies, Insulin Sliding Scale Code Status Info: DNR Allergies Info: Ace Inhibitors, Clindamycin   Insulin Sliding Scale Info: 3 times daily at meals / once at bedtime       Current Medications (11/17/2015):  This is the current hospital active medication list Current Facility-Administered Medications  Medication Dose Route Frequency Provider Last Rate Last Dose  . acetaminophen (TYLENOL) tablet 325 mg  325 mg Oral Daily PRN Norval Morton, MD      . albuterol (PROVENTIL) (2.5 MG/3ML) 0.083% nebulizer solution 2.5 mg  2.5 mg Nebulization Q6H R[place]ell Charmayne Sheer, MD   2.5 mg at 11/17/15 0842  . albuterol (PROVENTIL) (2.5 MG/3ML) 0.083% nebulizer solution 2.5 mg  2.5 mg Nebulization Q2H PRN  Norval Morton, MD      . allopurinol (ZYLOPRIM) tablet 100 mg  100 mg Oral Daily Norval Morton, MD   100 mg at 11/17/15 0931  . amiodarone (PACERONE) tablet 200 mg  200 mg Oral Daily Norval Morton, MD   200 mg at 11/17/15 0931  . apixaban (ELIQUIS) tablet 5 mg  5 mg Oral BID Norval Morton, MD   5 mg at 11/17/15 0931  . arformoterol (BROVANA) nebulizer solution 15 mcg  15 mcg Nebulization BID Norval Morton, MD    15 mcg at 11/17/15 [phone]  . benzonatate (TESSALON) capsule 200 mg  200 mg Oral BID PRN Norval Morton, MD      . budesonide (PULMICORT) nebulizer solution 0.5 mg  0.5 mg Nebulization BID Norval Morton, MD   0.5 mg at 11/17/15 0847  . calcium-vitamin D (OSCAL WITH D) 500-200 MG-UNIT per tablet 1 tablet  1 tablet Oral Q breakfast Norval Morton, MD   1 tablet at 11/17/15 0932  . cefTRIAXone (ROCEPHIN) 1 g in dextrose 5 % 50 mL IVPB  1 g Intravenous Q24H Norval Morton, MD 100 mL/hr at 11/17/15 0453 1 g at 11/17/15 0453  . cholecalciferol (VITAMIN D) tablet 2,000 Units  2,000 Units Oral Daily Norval Morton, MD   2,000 Units at 11/17/15 0931  . diltiazem (CARDIZEM CD) 24 hr capsule 180 mg  180 mg Oral Daily Norval Morton, MD   180 mg at 11/17/15 0930  . doxycycline (VIBRA-TABS) tablet 100 mg  100 mg Oral Q12H Norval Morton, MD   100 mg at 11/17/15 0932  . ezetimibe-simvastatin (VYTORIN) 10-40 MG per tablet 1 tablet  1 tablet Oral QHS Norval Morton, MD   1 tablet at 11/16/15 2317  . ferrous sulfate tablet 325 mg  325 mg Oral BID WC Rondell Charmayne Sheer, MD   325 mg at 11/17/15 0932  . furosemide (LASIX) tablet 160 mg  160 mg Oral BID Norval Morton, MD   160 mg at 11/17/15 0932  . guaiFENesin (MUCINEX) 12 hr tablet 600 mg  600 mg Oral BID Norval Morton, MD   600 mg at 11/17/15 0931  . insulin aspart (novoLOG) injection 0-20 Units  0-20 Units Subcutaneous TID WC Bonnielee Haff, MD   7 Units at 11/17/15 1206  . insulin aspart (novoLOG) injection 0-5 Units  0-5 Units Subcutaneous QHS Bonnielee Haff, MD   3 Units at 11/16/15 2143  . insulin aspart (novoLOG) injection 4 Units  4 Units Subcutaneous TID WC Ripudeep Krystal Eaton, MD   4 Units at 11/17/15 1206  . insulin glargine (LANTUS) injection 5 Units  5 Units Subcutaneous Daily Bonnielee Haff, MD   5 Units at 11/17/15 [phone]  . metolazone (ZAROXOLYN) tablet 2.5 mg  2.5 mg Oral Once PRN Norval Morton, MD      . metoprolol tartrate (LOPRESSOR) tablet 50 mg   50 mg Oral BID Norval Morton, MD   50 mg at 11/17/15 0931  . pantoprazole (PROTONIX) EC tablet 40 mg  40 mg Oral Daily Norval Morton, MD   40 mg at 11/17/15 0931  . potassium chloride SA (K-DUR,KLOR-CON) CR tablet 20 mEq  20 mEq Oral TID Norval Morton, MD   20 mEq at 11/17/15 0931  . sodium chloride 0.9 % injection 3 mL  3 mL Intravenous Q12H Rondell Charmayne Sheer, MD   3 mL at 11/16/15 2200  . tiotropium (SPIRIVA)  inhalation capsule 18 mcg  18 mcg Inhalation Daily Norval Morton, MD   18 mcg at 11/17/15 0901  . vitamin B-12 (CYANOCOBALAMIN) tablet 500 mcg  500 mcg Oral Daily Norval Morton, MD   500 mcg at 11/17/15 0931  . vitamin C (ASCORBIC ACID) tablet 500 mg  500 mg Oral Daily Norval Morton, MD   500 mg at 11/17/15 0931     Discharge Medications: DISCHARGE MEDICATIONS: Current Discharge Medication List    START taking these medications   Details  benzonatate (TESSALON) 200 MG capsule Take 1 capsule (200 mg total) by mouth 2 (two) times daily as needed for cough. Qty: 30 capsule, Refills: 0    cefUROXime (CEFTIN) 500 MG tablet Take 1 tablet (500 mg total) by mouth 2 (two) times daily with a meal. X 7days Qty: 14 tablet, Refills: 0    doxycycline (VIBRA-TABS) 100 MG tablet Take 1 tablet (100 mg total) by mouth 2 (two) times daily. X 7days Qty: 14 tablet, Refills: 0    predniSONE (DELTASONE) 10 MG tablet Prednisone dosing: Take Prednisone 40mg  (4 tabs) x 1 days, then taper to 30mg  (3 tabs) x 3 days, then 20mg  (2 tabs) x 3days, then 10mg  (1 tab) x 3days, then OFF. Qty: 22 tablet, Refills: 0      CONTINUE these medications which have CHANGED   Details  !! insulin aspart (NOVOLOG) 100 UNIT/ML injection Inject 5 Units into the skin 3 (three) times daily with meals. Qty: 10 mL, Refills: 11    !! insulin aspart (NOVOLOG) 100 UNIT/ML injection Inject 0-9 Units into the skin 3 (three) times daily with meals. Correction factor Sliding scale CBG 70 - 120: 0  units CBG 121 - 150: 1 unit, CBG 151 - 200: 2 units, CBG 201 - 250: 3 units, CBG 251 - 300: 5 units, CBG 301 - 350: 7 units, CBG 351 - 400: 9 units CBG > 400: 9 units and notify your MD Qty: 10 mL, Refills: 11    !! - Potential duplicate medications found. Please discuss with provider.    CONTINUE these medications which have NOT CHANGED   Details  albuterol (PROVENTIL) (2.5 MG/3ML) 0.083% nebulizer solution Take 2.5 mg by nebulization every 6 (six) hours as needed for wheezing or shortness of breath.    allopurinol (ZYLOPRIM) 100 MG tablet Take 1 tablet (100 mg total) by mouth daily.    amiodarone (PACERONE) 200 MG tablet Take 1 tablet (200 mg total) by mouth daily.   Associated Diagnoses: PAF (paroxysmal atrial fibrillation) (HCC)    apixaban (ELIQUIS) 5 MG TABS tablet Take 1 tablet (5 mg total) by mouth 2 (two) times daily. Qty: 60 tablet    Ascorbic Acid (VITAMIN C) 500 MG CAPS Take 500 mg by mouth daily.     budesonide-formoterol (SYMBICORT) 160-4.5 MCG/ACT inhaler Inhale 2 puffs into the lungs 2 (two) times daily. Qty: 1 Inhaler, Refills: 5    calcium-vitamin D (OSCAL WITH D) 500-200 MG-UNIT per tablet Take 1 tablet by mouth daily with breakfast.    Cholecalciferol (VITAMIN D) 2000 UNITS tablet Take 2,000 Units by mouth daily.    diltiazem (CARDIZEM CD) 180 MG 24 hr capsule Take 1 capsule (180 mg total) by mouth daily. Qty: 30 capsule, Refills: 3    ezetimibe-simvastatin (VYTORIN) 10-40 MG per tablet Take 1 tablet by mouth at bedtime.     ferrous sulfate 325 (65 FE) MG tablet Take 325 mg by mouth 2 (two) times daily with a meal.  furosemide (LASIX) 80 MG tablet Take 2 tablets (160 mg total) by mouth 2 (two) times daily. MAKE SURE TO TAKE AT 8 AM AND 2 PM   Associated Diagnoses: Acute on chronic diastolic heart failure (HCC)    glimepiride (AMARYL) 2 MG tablet Take 2 mg by mouth daily with breakfast.      guaiFENesin (DIABETIC TUSSIN EX) 100 MG/5ML liquid Take 200 mg by mouth 3 (three) times daily as needed for cough.    metolazone (ZAROXOLYN) 2.5 MG tablet Take 1 tablet only if weight is greater or equal to 300 lbs as needed Qty: 30 tablet, Refills: 3   Associated Diagnoses: Chronic diastolic heart failure (HCC)    metoprolol (LOPRESSOR) 50 MG tablet Take 1 tablet (50 mg total) by mouth 2 (two) times daily. Qty: 60 tablet, Refills: 3    Multiple Vitamins-Iron (MULTIVITAMIN/IRON PO) Take 1 tablet by mouth daily.    omeprazole (PRILOSEC) 20 MG capsule Take 20 mg by mouth daily.    potassium chloride SA (K-DUR,KLOR-CON) 20 MEQ tablet Take 20 mEq by mouth 3 (three) times daily. MAKE SURE TO TAKE EXTRA 20 MEQ ON METOLAZONE DAYS    PROAIR HFA 108 (90 BASE) MCG/ACT inhaler Inhale 2 puffs into the lungs every 6 (six) hours as needed for wheezing.     sitaGLIPtin (JANUVIA) 100 MG tablet Take 100 mg by mouth daily.    tiotropium (SPIRIVA) 18 MCG inhalation capsule Place 1 capsule (18 mcg total) into inhaler and inhale daily. Qty: 30 capsule, Refills: 5    vitamin B-12 (CYANOCOBALAMIN) 500 MCG tablet Take 500 mcg by mouth daily.    acetaminophen (TYLENOL) 325 MG tablet Take 325 mg by mouth daily as needed for mild pain.               Relevant Imaging Results:  Relevant Lab Results:   Additional [place], [place]

## 2015-11-17 NOTE — Clinical Social Work Note (Signed)
Clinical Social Work Assessment  Patient Details  Name: Paige Hardin MRN: [id number] Date of Birth: [birth year]  Date of referral:  11/17/15               Reason for consult:  Discharge Planning, Facility Placement                Permission sought to share information with:  Facility Sport and exercise psychologist, Family Supports Permission granted to share information::  Yes, Verbal Permission Granted  Name::        Agency::     Relationship::     Contact Information:     Housing/Transportation Living arrangements for the past 2 months:  [place] of Information:  Patient Patient Interpreter Needed:  None Criminal Activity/Legal Involvement Pertinent to Current Situation/Hospitalization:  No - Comment as needed Significant Relationships:  Other Family Members, Spouse Lives with:  Facility Resident Do you feel safe going back to the place where you live?  Yes Need for family participation in patient care:  Yes (Comment)  Care giving concerns:  Patient does not report any care giving concerns.   Social Worker assessment / plan:  CSW met with patient at bedside. Patient states she was admitted from [facility] assisted living and plans to return to the facility when discharged. She is happy with the care she receives at the facility. Patient states she will make her family aware of her discharge when appropriate and that she will require ambulance transport to facility. CSW will assist with DC as appropriate.  Employment status:  Retired Forensic scientist:  Medicare PT Recommendations:  Home with [place] / Referral to community resources:  Other (Comment Required) (Information will be sent to [facility]  Patient/Family's Response to care:  Patient appears happy with the care she has received.  Patient/Family's Understanding of and Emotional Response to Diagnosis, Current Treatment, and Prognosis:  Patient has good understanding of  reason for admission and understands what her post DC needs will be.   Emotional Assessment Appearance:  Appears stated age Attitude/Demeanor/Rapport:  Other (Appropriate and welcoming of CSW.) Affect (typically observed):  Accepting, Appropriate, Calm, Pleasant Orientation:  Oriented to Self, Oriented to Place, Oriented to  Time, Oriented to Situation Alcohol / Substance use:  Tobacco Use, Alcohol Use (Hx) Psych involvement (Current and /or in the community):  No (Comment)  Discharge Needs  Concerns to be addressed:  Discharge Planning Concerns Readmission within the last 30 days:  No Current discharge risk:  Chronically ill Barriers to Discharge:  Continued Medical Work up   [employer] MSW, [place], [place], [phone]

## 2015-11-17 NOTE — Evaluation (Signed)
Physical Therapy Evaluation Patient Details Name: Paige Hardin MRN: [id number] DOB: August 11, 1946 Today's Date: 11/17/2015   History of Present Illness  Pt is a 69 y/o F admitted for COPD exacerbation.  Pt's PMH includes chronic low back pain, obesity, depression.  Clinical Impression  Pt admitted with above diagnosis. Pt currently with functional limitations due to the deficits listed below (see PT Problem List). Paige Hardin was very pleasant and willing to work w/ therapy.  SpO2 dropped down to 88% on 4 LPM ambulating in hallway but pt was able to recover to 91% w/ instruction on pursed lip breathing.  She is from ALF where she is planning to return at d/c.  She will benefit from cardiopulmonary rehab. Pt will benefit from skilled PT to increase their independence and safety with mobility to allow discharge to the venue listed below.      Follow Up Recommendations No PT follow up    Equipment Recommendations  None recommended by PT    Recommendations for Other Services Other (comment) (cardiopulmoary rehab)     Precautions / Restrictions Precautions Precautions: Fall Restrictions Weight Bearing Restrictions: No      Mobility  Bed Mobility Overal bed mobility: Modified Independent             General bed mobility comments: HOB elevated. No physical assist or cues needed.  Transfers Overall transfer level: Needs assistance Equipment used: Rolling walker (2 wheeled) Transfers: Sit to/from Stand Sit to Stand: Supervision         General transfer comment: Supervision for pt's safety.  Cues for hand placement.  Ambulation/Gait Ambulation/Gait assistance: Supervision Ambulation Distance (Feet): 85 Feet Assistive device: Rolling walker (2 wheeled) Gait Pattern/deviations: Step-through pattern;Decreased stride length;Trunk flexed   Gait velocity interpretation: Below normal speed for age/gender General Gait Details: Trunk slightly flexed, cues for upright posture.  SpO2  down to 88% on 4 LPM O2 but pt able to recover back up to 91% w/ cues for pursed lip breathing.  Standing rest break x3 due to 3/4 DOE.    Stairs            Wheelchair Mobility    Modified Rankin (Stroke Patients Only)       Balance Overall balance assessment: Needs assistance Sitting-balance support: Feet supported Sitting balance-Leahy Scale: Good     Standing balance support: During functional activity Standing balance-Leahy Scale: Fair                               Pertinent Vitals/Pain Pain Assessment: 0-10 Pain Score: 4  Pain Location: headache at end of session Pain Descriptors / Indicators: Headache Pain Intervention(s): Limited activity within patient's tolerance;Monitored during session    Home Living Family/patient expects to be discharged to:: Assisted living               Home Equipment: Walker - 4 wheels (rollator)      Prior Function Level of Independence: Independent with assistive device(s)         Comments: PTA Ind w/ all ADLs and uses rollator to ambulate     Hand Dominance        Extremity/Trunk Assessment   Upper Extremity Assessment: Overall WFL for tasks assessed           Lower Extremity Assessment: RLE deficits/detail;LLE deficits/detail RLE Deficits / Details: grossly 4/5 strength LLE Deficits / Details: grossly 4/5 strength     Communication   Communication: No  difficulties  Cognition Arousal/Alertness: Awake/alert Behavior During Therapy: WFL for tasks assessed/performed Overall Cognitive Status: Within Functional Limits for tasks assessed                      General Comments      Exercises General Exercises - Lower Extremity Ankle Circles/Pumps: AROM;Both;Supine;Seated;15 reps Long Arc Quad: AROM;Both;10 reps;Seated Hip Flexion/Marching: AROM;Both;10 reps;Seated      Assessment/Plan    PT Assessment Patient needs continued PT services  PT Diagnosis Difficulty walking   PT  Problem List Decreased strength;Decreased activity tolerance;Decreased balance;Decreased mobility  PT Treatment Interventions DME instruction;Gait training;Functional mobility training;Therapeutic activities;Therapeutic exercise;Balance training;Neuromuscular re-education;Patient/family education   PT Goals (Current goals can be found in the Care Plan section) Acute Rehab PT Goals Patient Stated Goal: to go back to ALF PT Goal Formulation: With patient Time For Goal Achievement: 12/01/15 Potential to Achieve Goals: Good    Frequency Min 2X/week   Barriers to discharge        Co-evaluation               End of Session Equipment Utilized During Treatment: Gait belt;Oxygen Activity Tolerance: Patient tolerated treatment well;Other (comment) (limited by SOB) Patient left: in chair;with call bell/phone within reach Nurse Communication: Mobility status;Other (comment) (SpO2 levels)         Time: [id number] PT Time Calculation (min) (ACUTE ONLY): 27 min   Charges:   PT Evaluation $Initial PT Evaluation Tier I: 1 Procedure PT Treatments $Gait Training: 8-22 mins   PT G CodesJoslyn Hy PT, DPT [phone] Pager: [phone] 11/17/2015, 5:22 PM

## 2015-11-17 NOTE — Progress Notes (Signed)
[facility]                                                                              Patient Demographics  Paige Hardin, is a 69 y.o. female, DOB - 1946-02-25, [id number]  Admit date - 11/16/2015   Admitting Physician Norval Morton, MD  Outpatient Primary MD for the patient is Stephens Shire, MD  LOS - 1   Chief Complaint  Patient presents with  . Shortness of Breath  . Hyperglycemia       Brief HPI   Patient is a 69 year old female with a past medical history significant for hypertension, atrial fibrillation on Eliquis, hyperlipidemia, diabetes mellitus type 2, diastolic congestive heart failure, COPD oxygen dependent 2 L of nasal cannula oxygen; who presented with complaints of shortness of breath. Symptoms initially started approximately 1 week ago with a productive cough with [place] sputum. Associated symptoms included low-grade fever of 99.109F, wheezing, nasal congestion, postnasal drip, lower leg swelling. Symptoms progressively worsened as the week went on and the patient required more and more oxygen to maintain for O2 saturations Upon admission to the emergency department patient was seen have WBC count of 18.6, elevated blood glucose of 499, anion gap of 10, BMP of 166, chest x-ray revealing pulmonary congestion.    Assessment & Plan   Principal Problem:   Suspected COPD with acute exacerbation, GPC bacteremia 1/2:  - Continue scheduled bronchodilators, Spiriva - Changed Symbicort inhaler to Pulmicort and Brovana nebs  - Continue doxycycline, Rocephin, chest x-ray negative for any infiltrates. - Blood cultures 1/2 GPC, continue doxycycline, may be contaminant - Urine strep antigen negative.   PAF (paroxysmal atrial fibrillation) s/p multiple DCCVs; on Amiodarone:  - Rate Controlled on amiodarone. Patient has a chadsvasc score of 5 and is on chronic anticoagulation of Eliquis. - Continue Eliquis, amiodarone, diltiazem  Chronic congestive  heart failure:  - last echocardiogram in 03/2015 showing EF of 55-60% with no wall motion abnormalities. - Metolazone as needed for weights greater than 300 per home regimen - Negative balance of 1.8 L, continue furosemide  Hypertension: Stable -Continue metoprolol, furosemide, and other meds as seen above  Diabetes mellitus type 2 with hyperglycemia. Patient found to have a blood glucose on admission of 499 no anion gap. -- Continue sliding scale insulin, meal coverage increased, continue Lantus  - Hemoglobin A1c 9.7  Chronic renal disease, stage III: Stable. Creatinine appears to be at baseline around 1.5    Iron deficiency anemia: - H&H stable   Hyperlipidemia -Continue atorvastatin  Morbid obesity- BMI 50   Code Status: dnr   Family Communication: Discussed in detail with the patient, all imaging results, lab results explained to the patient   Disposition Plan: Awaiting final blood culture results, if negative, will DC back to [facility]  Time Spent in minutes  25 minutes  Procedures  cxr  Consults   None   DVT Prophylaxis apixaban  Medications  Scheduled Meds: . albuterol  2.5 mg Nebulization Q6H  . allopurinol  100 mg Oral Daily  . amiodarone  200 mg Oral Daily  . apixaban  5 mg Oral BID  .  arformoterol  15 mcg Nebulization BID  . budesonide (PULMICORT) nebulizer solution  0.5 mg Nebulization BID  . calcium-vitamin D  1 tablet Oral Q breakfast  . cefTRIAXone (ROCEPHIN)  IV  1 g Intravenous Q24H  . cholecalciferol  2,000 Units Oral Daily  . diltiazem  180 mg Oral Daily  . doxycycline  100 mg Oral Q12H  . ezetimibe-simvastatin  1 tablet Oral QHS  . ferrous sulfate  325 mg Oral BID WC  . furosemide  160 mg Oral BID  . guaiFENesin  600 mg Oral BID  . insulin aspart  0-20 Units Subcutaneous TID WC  . insulin aspart  0-5 Units Subcutaneous QHS  . insulin aspart  3 Units Subcutaneous BID WC  . insulin glargine  5 Units Subcutaneous Daily  .  metoprolol  50 mg Oral BID  . pantoprazole  40 mg Oral Daily  . potassium chloride SA  20 mEq Oral TID  . sodium chloride  3 mL Intravenous Q12H  . tiotropium  18 mcg Inhalation Daily  . vitamin B-12  500 mcg Oral Daily  . vitamin C  500 mg Oral Daily   Continuous Infusions:  PRN Meds:.acetaminophen, albuterol, benzonatate, metolazone   Antibiotics   Anti-infectives    Start     Dose/Rate Route Frequency Ordered Stop   11/16/15 1000  doxycycline (VIBRA-TABS) tablet 100 mg     100 mg Oral Every 12 hours 11/16/15 0536 11/23/15 0959   11/16/15 0545  cefTRIAXone (ROCEPHIN) 1 g in dextrose 5 % 50 mL IVPB     1 g 100 mL/hr over 30 Minutes Intravenous Every 24 hours 11/16/15 0536 11/23/15 0544   11/16/15 0400  doxycycline (VIBRAMYCIN) 100 mg in dextrose 5 % 250 mL IVPB     100 mg 125 mL/hr over 120 Minutes Intravenous  Once 11/16/15 0335 11/16/15 0607   11/16/15 0345  levofloxacin (LEVAQUIN) IVPB 750 mg     750 mg 100 mL/hr over 90 Minutes Intravenous  Once 11/16/15 0336 11/16/15 0540        Subjective:   Paige Hardin was seen and examined today.  Patient denies dizziness, chest pain, shortness of breath, abdominal pain, N/V/D/C, new weakness, numbess, tingling. No acute events overnight.    Objective:   Blood pressure 124/50, pulse 79, temperature 97.7 F (36.5 C), temperature source Oral, resp. rate 18, height 5\' 5"  (1.651 m), weight 136.079 kg (300 lb), SpO2 92 %.  Wt Readings from Last 3 Encounters:  11/17/15 136.079 kg (300 lb)  09/21/15 136.079 kg (300 lb)  09/03/15 133.811 kg (295 lb)     Intake/Output Summary (Last 24 hours) at 11/17/15 1039 Last data filed at 11/17/15 1035  Gross per 24 hour  Intake    720 ml  Output   2650 ml  Net  -1930 ml    Exam  General: Alert and oriented x 3, NAD  HEENT:  PERRLA, EOMI, Anicteric Sclera, mucous membranes moist.   Neck: Supple, no JVD, no masses  CVS: S1 S2  irregularly irregular   Respiratory:  diminished breath  sounds at the bases   Abdomen: Soft, nontender, nondistended, + bowel sounds  Ext: no cyanosis clubbing or edema  Neuro: AAOx3, Cr N's II- XII. Strength 5/5 upper and lower extremities bilaterally  Skin: No rashes  Psych: Normal affect and demeanor, alert and oriented x3    Data Review   Micro Results Recent Results (from the past 240 hour(s))  Blood culture (routine x 2)  Status: None (Preliminary result)   Collection Time: 11/16/15  3:45 AM  Result Value Ref Range Status   Specimen Description BLOOD LEFT ARM  Final   Special Requests AEROBIC BOTTLE ONLY 10ML  Final   Culture  Setup Time   Final    GRAM POSITIVE COCCI IN CLUSTERS AEROBIC BOTTLE ONLY CRITICAL RESULT CALLED TO, READ BACK BY AND VERIFIED WITH: CESAR @0247  11/17/15 MKELLY    Culture PENDING  Incomplete   Report Status PENDING  Incomplete    Radiology Reports Dg Chest Portable 1 View  11/16/2015  CLINICAL DATA:  Shortness of breath with wheezing. EXAM: PORTABLE CHEST 1 VIEW COMPARISON:  10/03/2015 FINDINGS: Chronic cardiomegaly with pulmonary venous congestion including cephalized blood flow. No edema, effusion, or pneumothorax. Stable aortic and hilar contours. A right lower lobe pulmonary nodule seen on chest CT 10/07/2015 is not visualized. IMPRESSION: Chronic cardiomegaly with pulmonary venous congestion. Electronically Signed   By: Monte Fantasia M.D.   On: 11/16/2015 02:39    CBC  Recent Labs Lab 11/16/15 0246 11/17/15 0858  WBC 18.6* 13.1*  HGB 11.5* 11.0*  HCT 35.7* 34.3*  PLT 247 239  MCV 94.2 95.3  MCH 30.3 30.6  MCHC 32.2 32.1  RDW 15.1 15.5  LYMPHSABS 1.1  --   MONOABS 0.8  --   EOSABS 0.1  --   BASOSABS 0.0  --     Chemistries   Recent Labs Lab 11/16/15 0246 11/17/15 0858  NA 136 140  K 4.5 4.4  CL 95* 97*  CO2 31 31  GLUCOSE 439* 213*  BUN 34* 33*  CREATININE 1.47* 1.34*  CALCIUM 9.4 9.2    ------------------------------------------------------------------------------------------------------------------ estimated creatinine clearance is 54.6 mL/min (by C-G formula based on Cr of 1.34). ------------------------------------------------------------------------------------------------------------------  Recent Labs  11/16/15 0630  HGBA1C 9.7*   ------------------------------------------------------------------------------------------------------------------ No results for input(s): CHOL, HDL, LDLCALC, TRIG, CHOLHDL, LDLDIRECT in the last 72 hours. ------------------------------------------------------------------------------------------------------------------ No results for input(s): TSH, T4TOTAL, T3FREE, THYROIDAB in the last 72 hours.  Invalid input(s): FREET3 ------------------------------------------------------------------------------------------------------------------ No results for input(s): VITAMINB12, FOLATE, FERRITIN, TIBC, IRON, RETICCTPCT in the last 72 hours.  Coagulation profile No results for input(s): INR, PROTIME in the last 168 hours.  No results for input(s): DDIMER in the last 72 hours.  Cardiac Enzymes  Recent Labs Lab 11/16/15 0246  TROPONINI <0.03   ------------------------------------------------------------------------------------------------------------------ Invalid input(s): POCBNP   Recent Labs  11/16/15 1229 11/16/15 1627 11/16/15 2139 11/17/15 0015 11/17/15 0451 11/17/15 0724  GLUCAP 420* 398* 280* 291* 173* 169*     RAI,RIPUDEEP M.D. [facility] 11/17/2015, 10:39 AM  Pager: [phone] Between 7am to 7pm - call Pager - [phone]  After 7pm go to [email or web address] - password [email or web address]  Call night coverage person covering after 7pm

## 2015-11-17 NOTE — Care Management Note (Signed)
Case Management Note  Patient Details  Name: Paige Hardin MRN: [id number] Date of Birth: [birth year]  Subjective/Objective:   Pt admitted for COPD exacerbation. Increased temperature- Initiated on IV antibiotic therapy. Pt is from [facility].                  Action/Plan: CSW aware and will assist pt with any disposition needs. [facility] to f/u once pt is d/c. No further needs at this time.    Expected Discharge Date:                  Expected Discharge Plan:  Assisted Living / Rest Home  In-House Referral:  Clinical Social Work  Discharge planning Services  CM Consult  Post Acute Care Choice:    Choice offered to:     DME Arranged:    DME Agency:     HH Arranged:    HH Agency:     Status of Service:  In process, will continue to follow  Medicare Important Message Given:    Date Medicare IM Given:    Medicare IM give by:    Date Additional Medicare IM Given:    Additional Medicare Important Message give by:     If discussed at [place] of Stay Meetings, dates discussed:    Additional Comments:  Bethena Roys, RN 11/17/2015, 12:42 PM

## 2015-11-17 NOTE — Progress Notes (Signed)
Increased O2 to 4L [place] due to low sats

## 2015-11-17 NOTE — Progress Notes (Signed)
Microbiology lab called and reported a positive blood culture, MD notified.  Paige Hardin

## 2015-11-17 NOTE — Plan of Care (Signed)
Problem: Pain Managment: Goal: General experience of comfort will improve Outcome: Completed/Met Date Met:  11/17/15 Pt educated on pain scale and interventions. Pt verbalized understanding. No complaints of pain at this time.

## 2015-11-17 NOTE — Clinical Social Work Note (Signed)
Patient admitted from [facility]. Per MD, may be able to return to facility today. CSW will assess.   Liz Beach MSW, [place], [place], [phone]

## 2015-11-17 NOTE — Progress Notes (Signed)
UR Completed Doni Bacha Graves-Bigelow, RN,BSN [phone]  

## 2015-11-17 NOTE — Plan of Care (Signed)
Problem: Safety: Goal: Ability to remain free from injury will improve Outcome: Completed/Met Date Met:  11/17/15 Pt educated on safety measures put into place. Pt verbalized understanding.

## 2015-11-17 NOTE — Consult Note (Signed)
   [facility] CM Inpatient Consult   11/17/2015  Paige Hardin 08-07-1946 [id number]   Came to visit patient to discuss and offer [facility] services. She endorses she is from [facility] and plans on returning there at discharge. States she does have a nurse that she can follow up with at the facility post discharge without additional fee. She consents to [facility] Licensed CSW follow up at facility. Consent signed. Explained that [facility] will not interfere or replace services provided by ALF. She endorses her Primary Care MD is Dr. Tollie Pizza. [facility] Care Management packet and contact information left at bedside. Appreciative of visit. Made inpatient RNCM aware.  Marthenia Rolling, MSN-Ed, RN,BSN [facility] Liaison [phone]

## 2015-11-18 ENCOUNTER — Other Ambulatory Visit: Payer: Self-pay | Admitting: *Deleted

## 2015-11-18 LAB — BASIC METABOLIC PANEL
Anion gap: 13 (ref 5–15)
BUN: 40 mg/dL — AB (ref 6–20)
CALCIUM: 9.2 mg/dL (ref 8.9–10.3)
CO2: 29 mmol/L (ref 22–32)
CREATININE: 1.54 mg/dL — AB (ref 0.44–1.00)
Chloride: 95 mmol/L — ABNORMAL LOW (ref 101–111)
GFR calc Af Amer: 38 mL/min — ABNORMAL LOW (ref >60)
GFR, EST NON AFRICAN AMERICAN: 33 mL/min — AB (ref >60)
GLUCOSE: 205 mg/dL — AB (ref 65–99)
POTASSIUM: 4.4 mmol/L (ref 3.5–5.1)
Sodium: 137 mmol/L (ref 135–145)

## 2015-11-18 LAB — CBC
HCT: 33.6 % — ABNORMAL LOW (ref 36.0–46.0)
Hemoglobin: 10.3 g/dL — ABNORMAL LOW (ref 12.0–15.0)
MCH: 29.9 pg (ref 26.0–34.0)
MCHC: 30.7 g/dL (ref 30.0–36.0)
MCV: 97.4 fL (ref 78.0–100.0)
PLATELETS: 242 10*3/uL (ref 150–400)
RBC: 3.45 MIL/uL — ABNORMAL LOW (ref 3.87–5.11)
RDW: 15.7 % — AB (ref 11.5–15.5)
WBC: 10.7 10*3/uL — ABNORMAL HIGH (ref 4.0–10.5)

## 2015-11-18 LAB — URINE CULTURE

## 2015-11-18 LAB — GLUCOSE, CAPILLARY
GLUCOSE-CAPILLARY: 239 mg/dL — AB (ref 65–99)
Glucose-Capillary: 201 mg/dL — ABNORMAL HIGH (ref 65–99)
Glucose-Capillary: 180 mg/dL — ABNORMAL HIGH (ref 65–99)
Glucose-Capillary: 225 mg/dL — ABNORMAL HIGH (ref 65–99)
Glucose-Capillary: 248 mg/dL — ABNORMAL HIGH (ref 65–99)

## 2015-11-18 MED ORDER — CEFUROXIME AXETIL 500 MG PO TABS: 500.0000 mg | ORAL_TABLET | Freq: Two times a day (BID) | ORAL | Status: DC

## 2015-11-18 MED ORDER — DOXYCYCLINE HYCLATE 100 MG PO TABS: 100.0000 mg | ORAL_TABLET | Freq: Two times a day (BID) | ORAL | Status: DC

## 2015-11-18 MED ORDER — PREDNISONE 20 MG PO TABS
40.0000 mg | ORAL_TABLET | Freq: Every day | ORAL | Status: DC
  Administered 2015-11-18 – 2015-11-19 (×2): 40 mg via ORAL
  Filled 2015-11-18 (×2): qty 2

## 2015-11-18 MED ORDER — PREDNISONE 10 MG PO TABS: ORAL_TABLET | ORAL | Status: DC

## 2015-11-18 MED ORDER — IPRATROPIUM-ALBUTEROL 0.5-2.5 (3) MG/3ML IN SOLN
3.0000 mL | RESPIRATORY_TRACT | Status: DC
  Administered 2015-11-18 – 2015-11-19 (×7): 3 mL via RESPIRATORY_TRACT
  Filled 2015-11-18 (×7): qty 3

## 2015-11-18 MED ORDER — BENZONATATE 200 MG PO CAPS: 200.0000 mg | ORAL_CAPSULE | Freq: Two times a day (BID) | ORAL | Status: DC | PRN

## 2015-11-18 NOTE — Discharge Instructions (Signed)

## 2015-11-18 NOTE — Progress Notes (Signed)
[facility]                                                                              Patient Demographics  Paige Hardin, is a 69 y.o. female, DOB - [birth year], [id number]  Admit date - 11/16/2015   Admitting Physician Norval Morton, MD  Outpatient Primary MD for the patient is Stephens Shire, MD  LOS - 2   Chief Complaint  Patient presents with  . Shortness of Breath  . Hyperglycemia       Brief HPI   Patient is a 69 year old female with a past medical history significant for hypertension, atrial fibrillation on Eliquis, hyperlipidemia, diabetes mellitus type 2, diastolic congestive heart failure, COPD oxygen dependent 2 L of nasal cannula oxygen; who presented with complaints of shortness of breath. Symptoms initially started approximately 1 week ago with a productive cough with [place] sputum. Associated symptoms included low-grade fever of 99.57F, wheezing, nasal congestion, postnasal drip, lower leg swelling. Symptoms progressively worsened as the week went on and the patient required more and more oxygen to maintain for O2 saturations Upon admission to the emergency department patient was seen have WBC count of 18.6, elevated blood glucose of 499, anion gap of 10, BMP of 166, chest x-ray revealing pulmonary congestion.    Assessment & Plan   Principal Problem:   Suspected COPD with acute exacerbation, GPC bacteremia 1/2:  - - Currently wheezing, placed on a scheduled q4hours, started on prednisone -  Changed Symbicort inhaler to Pulmicort and Brovana nebs  - Continue doxycycline, Rocephin, chest x-ray negative for any infiltrates. - Blood cultures 1/2 GPC  in clusters, continue doxycycline, may be contaminant, still waiting for the final blood culture results - Urine strep antigen negative.   Klebsiella UTI: - Urine culture showed Klebsiella, continue IV Rocephin, will transition to oral ceftin at dc  PAF (paroxysmal atrial fibrillation) s/p  multiple DCCVs; on Amiodarone:  - Rate Controlled on amiodarone. Patient has a chadsvasc score of 5 and is on chronic anticoagulation of Eliquis. - Continue Eliquis, amiodarone, diltiazem  Chronic congestive heart failure:  - last echocardiogram in 03/2015 showing EF of 55-60% with no wall motion abnormalities. - Metolazone as needed for weights greater than 300 per home regimen - Negative balance of 3.1L, continue furosemide  Hypertension: Stable -Continue metoprolol, furosemide, and other meds as seen above  Diabetes mellitus type 2 with hyperglycemia. Patient found to have a blood glucose on admission of 499 no anion gap. -- Continue sliding scale insulin, meal coverage increased, continue Lantus  - Hemoglobin A1c 9.7 - Monitor closely with steroids  Chronic renal disease, stage III: Stable. Creatinine appears to be at baseline around 1.5    Iron deficiency anemia: - H&H stable   Hyperlipidemia -Continue atorvastatin  Morbid obesity- BMI 50   Code Status: dnr   Family Communication: Discussed in detail with the patient, all imaging results, lab results explained to the patient   Disposition Plan: Awaiting final blood culture results, if negative, will DC back to [facility]  Time Spent in minutes  25 minutes  Procedures  cxr  Consults   None  DVT Prophylaxis apixaban  Medications  Scheduled Meds: . albuterol  2.5 mg Nebulization Q6H  . allopurinol  100 mg Oral Daily  . amiodarone  200 mg Oral Daily  . apixaban  5 mg Oral BID  . arformoterol  15 mcg Nebulization BID  . budesonide (PULMICORT) nebulizer solution  0.5 mg Nebulization BID  . calcium-vitamin D  1 tablet Oral Q breakfast  . cefTRIAXone (ROCEPHIN)  IV  1 g Intravenous Q24H  . cholecalciferol  2,000 Units Oral Daily  . diltiazem  180 mg Oral Daily  . doxycycline  100 mg Oral Q12H  . ezetimibe-simvastatin  1 tablet Oral QHS  . ferrous sulfate  325 mg Oral BID WC  . furosemide  160 mg Oral  BID  . guaiFENesin  600 mg Oral BID  . insulin aspart  0-20 Units Subcutaneous TID WC  . insulin aspart  0-5 Units Subcutaneous QHS  . insulin aspart  4 Units Subcutaneous TID WC  . insulin glargine  5 Units Subcutaneous Daily  . metoprolol  50 mg Oral BID  . pantoprazole  40 mg Oral Daily  . potassium chloride SA  20 mEq Oral TID  . sodium chloride  3 mL Intravenous Q12H  . tiotropium  18 mcg Inhalation Daily  . vitamin B-12  500 mcg Oral Daily  . vitamin C  500 mg Oral Daily   Continuous Infusions:  PRN Meds:.acetaminophen, albuterol, benzonatate, metolazone   Antibiotics   Anti-infectives    Start     Dose/Rate Route Frequency Ordered Stop   11/16/15 1000  doxycycline (VIBRA-TABS) tablet 100 mg     100 mg Oral Every 12 hours 11/16/15 0536 11/23/15 0959   11/16/15 0545  cefTRIAXone (ROCEPHIN) 1 g in dextrose 5 % 50 mL IVPB     1 g 100 mL/hr over 30 Minutes Intravenous Every 24 hours 11/16/15 0536 11/23/15 0544   11/16/15 0400  doxycycline (VIBRAMYCIN) 100 mg in dextrose 5 % 250 mL IVPB     100 mg 125 mL/hr over 120 Minutes Intravenous  Once 11/16/15 0335 11/16/15 0607   11/16/15 0345  levofloxacin (LEVAQUIN) IVPB 750 mg     750 mg 100 mL/hr over 90 Minutes Intravenous  Once 11/16/15 0336 11/16/15 0540        Subjective:   Paige Hardin was seen and examined today. Wheezing today, otherwise in good spirits no chest pain or shortness of breath. Sitting up in the chair. Patient denies dizziness, abdominal pain, N/V/D/C, new weakness, numbess, tingling. No acute events overnight.    Objective:   Blood pressure 115/32, pulse 79, temperature 98.7 F (37.1 C), temperature source Oral, resp. rate 20, height 5\' 5"  (1.651 m), weight 135.217 kg (298 lb 1.6 oz), SpO2 93 %.  Wt Readings from Last 3 Encounters:  11/18/15 135.217 kg (298 lb 1.6 oz)  09/21/15 136.079 kg (300 lb)  09/03/15 133.811 kg (295 lb)     Intake/Output Summary (Last 24 hours) at 11/18/15 1122 Last data  filed at 11/18/15 0425  Gross per 24 hour  Intake    240 ml  Output   1600 ml  Net  -1360 ml    Exam  General: Alert and oriented x 3, NAD  HEENT:  PERRLA, EOMI, Anicteric Sclera, mucous membranes moist.   Neck: Supple, no JVD, no masses  CVS: S1 S2  irregularly irregular   Respiratory:  diminished breath sounds at the bases with wheezing  Abdomen: Soft, nontender, nondistended, + bowel sounds  Ext: no cyanosis clubbing or edema  Neuro: no new deficits  Skin: No rashes  Psych: Normal affect and demeanor, alert and oriented x3    Data Review   Micro Results Recent Results (from the past 240 hour(s))  Blood culture (routine x 2)     Status: None (Preliminary result)   Collection Time: 11/16/15  3:40 AM  Result Value Ref Range Status   Specimen Description BLOOD RIGHT ARM  Final   Special Requests BOTTLES DRAWN AEROBIC AND ANAEROBIC 10ML  Final   Culture NO GROWTH 1 DAY  Final   Report Status PENDING  Incomplete  Blood culture (routine x 2)     Status: None (Preliminary result)   Collection Time: 11/16/15  3:45 AM  Result Value Ref Range Status   Specimen Description BLOOD LEFT ARM  Final   Special Requests AEROBIC BOTTLE ONLY 10ML  Final   Culture  Setup Time   Final    GRAM POSITIVE COCCI IN CLUSTERS AEROBIC BOTTLE ONLY CRITICAL RESULT CALLED TO, READ BACK BY AND VERIFIED WITH: CESAR @0247  11/17/15 MKELLY    Culture TOO YOUNG TO READ  Final   Report Status PENDING  Incomplete  Urine culture     Status: None   Collection Time: 11/16/15  3:59 AM  Result Value Ref Range Status   Specimen Description URINE, CATHETERIZED  Final   Special Requests NONE  Final   Culture >=100,000 COLONIES/mL KLEBSIELLA PNEUMONIAE  Final   Report Status 11/18/2015 FINAL  Final   Organism ID, Bacteria KLEBSIELLA PNEUMONIAE  Final      Susceptibility   Klebsiella pneumoniae - MIC*    AMPICILLIN 16 RESISTANT Resistant     CEFAZOLIN <=4 SENSITIVE Sensitive     CEFTRIAXONE <=1  SENSITIVE Sensitive     CIPROFLOXACIN <=0.25 SENSITIVE Sensitive     GENTAMICIN <=1 SENSITIVE Sensitive     IMIPENEM <=0.25 SENSITIVE Sensitive     NITROFURANTOIN <=16 SENSITIVE Sensitive     TRIMETH/SULFA <=20 SENSITIVE Sensitive     AMPICILLIN/SULBACTAM 4 SENSITIVE Sensitive     PIP/TAZO <=4 SENSITIVE Sensitive     * >=100,000 COLONIES/mL KLEBSIELLA PNEUMONIAE    Radiology Reports Dg Chest Portable 1 View  11/16/2015  CLINICAL DATA:  Shortness of breath with wheezing. EXAM: PORTABLE CHEST 1 VIEW COMPARISON:  10/03/2015 FINDINGS: Chronic cardiomegaly with pulmonary venous congestion including cephalized blood flow. No edema, effusion, or pneumothorax. Stable aortic and hilar contours. A right lower lobe pulmonary nodule seen on chest CT 10/07/2015 is not visualized. IMPRESSION: Chronic cardiomegaly with pulmonary venous congestion. Electronically Signed   By: Monte Fantasia M.D.   On: 11/16/2015 02:39    CBC  Recent Labs Lab 11/16/15 0246 11/17/15 0858 11/18/15 0428  WBC 18.6* 13.1* 10.7*  HGB 11.5* 11.0* 10.3*  HCT 35.7* 34.3* 33.6*  PLT 247 239 242  MCV 94.2 95.3 97.4  MCH 30.3 30.6 29.9  MCHC 32.2 32.1 30.7  RDW 15.1 15.5 15.7*  LYMPHSABS 1.1  --   --   MONOABS 0.8  --   --   EOSABS 0.1  --   --   BASOSABS 0.0  --   --     Chemistries   Recent Labs Lab 11/16/15 0246 11/17/15 0858 11/18/15 0428  NA 136 140 137  K 4.5 4.4 4.4  CL 95* 97* 95*  CO2 31 31 29   GLUCOSE 439* 213* 205*  BUN 34* 33* 40*  CREATININE 1.47* 1.34* 1.54*  CALCIUM 9.4 9.2 9.2   ------------------------------------------------------------------------------------------------------------------  estimated creatinine clearance is 47.4 mL/min (by C-G formula based on Cr of 1.54). ------------------------------------------------------------------------------------------------------------------  Recent Labs  11/16/15 0630  HGBA1C 9.7*    ------------------------------------------------------------------------------------------------------------------ No results for input(s): CHOL, HDL, LDLCALC, TRIG, CHOLHDL, LDLDIRECT in the last 72 hours. ------------------------------------------------------------------------------------------------------------------ No results for input(s): TSH, T4TOTAL, T3FREE, THYROIDAB in the last 72 hours.  Invalid input(s): FREET3 ------------------------------------------------------------------------------------------------------------------ No results for input(s): VITAMINB12, FOLATE, FERRITIN, TIBC, IRON, RETICCTPCT in the last 72 hours.  Coagulation profile No results for input(s): INR, PROTIME in the last 168 hours.  No results for input(s): DDIMER in the last 72 hours.  Cardiac Enzymes  Recent Labs Lab 11/16/15 0246  TROPONINI <0.03   ------------------------------------------------------------------------------------------------------------------ Invalid input(s): POCBNP   Recent Labs  11/17/15 1121 11/17/15 1616 11/17/15 1958 11/17/15 2349 11/18/15 0417 11/18/15 0742  GLUCAP 4* 224* 38* 210* 201* 180*     Imanie Darrow M.D. [facility] 11/18/2015, 11:22 AM  Pager: [phone] Between 7am to 7pm - call Pager - [phone]  After 7pm go to [email or web address] - password [email or web address]  Call night coverage person covering after 7pm

## 2015-11-19 LAB — BASIC METABOLIC PANEL
ANION GAP: 14 (ref 5–15)
BUN: 41 mg/dL — ABNORMAL HIGH (ref 6–20)
CALCIUM: 9.4 mg/dL (ref 8.9–10.3)
CHLORIDE: 91 mmol/L — AB (ref 101–111)
CO2: 30 mmol/L (ref 22–32)
Creatinine, Ser: 1.58 mg/dL — ABNORMAL HIGH (ref 0.44–1.00)
GFR calc non Af Amer: 32 mL/min — ABNORMAL LOW (ref >60)
GFR, EST AFRICAN AMERICAN: 37 mL/min — AB (ref >60)
Glucose, Bld: 393 mg/dL — ABNORMAL HIGH (ref 65–99)
POTASSIUM: 4.4 mmol/L (ref 3.5–5.1)
Sodium: 135 mmol/L (ref 135–145)

## 2015-11-19 LAB — EXPECTORATED SPUTUM ASSESSMENT W GRAM STAIN, RFLX TO RESP C

## 2015-11-19 LAB — GLUCOSE, CAPILLARY
Glucose-Capillary: 355 mg/dL — ABNORMAL HIGH (ref 65–99)
Glucose-Capillary: 286 mg/dL — ABNORMAL HIGH (ref 65–99)

## 2015-11-19 LAB — CULTURE, BLOOD (ROUTINE X 2)

## 2015-11-19 LAB — EXPECTORATED SPUTUM ASSESSMENT W REFEX TO RESP CULTURE

## 2015-11-19 MED ORDER — INSULIN ASPART 100 UNIT/ML ~~LOC~~ SOLN: 5.0000 [IU] | Freq: Three times a day (TID) | SUBCUTANEOUS | Status: DC

## 2015-11-19 MED ORDER — INSULIN GLARGINE 100 UNIT/ML ~~LOC~~ SOLN
10.0000 [IU] | Freq: Every day | SUBCUTANEOUS | Status: DC
  Administered 2015-11-19: 10 [IU] via SUBCUTANEOUS
  Filled 2015-11-19: qty 0.1

## 2015-11-19 MED ORDER — INSULIN ASPART 100 UNIT/ML ~~LOC~~ SOLN: 0.0000 [IU] | Freq: Three times a day (TID) | SUBCUTANEOUS | Status: DC

## 2015-11-19 MED ORDER — INSULIN ASPART 100 UNIT/ML ~~LOC~~ SOLN
5.0000 [IU] | Freq: Three times a day (TID) | SUBCUTANEOUS | Status: DC
Start: 2015-11-19 — End: 2015-11-19
  Administered 2015-11-19: 5 [IU] via SUBCUTANEOUS

## 2015-11-19 NOTE — NC FL2 (Signed)
[place] LEVEL OF CARE SCREENING TOOL     IDENTIFICATION  Patient Name: Paige Hardin Birthdate: 1946/02/11 Sex: female Admission Date (Current Location): 11/16/2015  [facility] and [place] Number:  Herbalist and Address:  [facility] [place], [place] [address], [place], [place] [address]      Provider Number: [id number]  Attending Physician Name and Address:  Mendel Corning, MD  Relative Name and Phone Number:       Current Level of Care: Hospital Recommended Level of Care: [place] Prior Approval Number:    Date Approved/Denied:   PASRR Number:    Discharge Plan: Other (Comment) (Assisted Living)    Current Diagnoses: Patient Active Problem List   Diagnosis Date Noted  . Acute exacerbation of COPD with asthma ([place]) 11/16/2015  . COPD with acute exacerbation ([place]) 11/16/2015  . Type 2 diabetes mellitus with hyperglycemia ([place]) 11/16/2015  . Hyperlipidemia 11/16/2015  . Excessive daytime sleepiness 09/08/2015  . Hemoptysis 03/30/2014  . Iron deficiency anemia 03/29/2014  . Atrial fibrillation with RVR- DCCV 03/27/14 03/25/2014  . Acute respiratory failure with hypoxia ([place]) 03/23/2014  . COPD exacerbation ([place]) 02/24/2014  . Altered mental status 02/24/2014  . Pulmonary edema 02/24/2014  . HTN (hypertension) 01/21/2014  . Chronic anticoagulation 01/21/2014  . Pulmonary HTN- pa 53 mmHg Echo 01/15/14 01/21/2014  . Morbid obesity- BMI 50 01/21/2014  . Poorly controlled diabetes mellitus ([place]) 01/21/2014  . Chronic renal disease, stage III 01/21/2014  . Chronic diastolic heart failure ([place]) 01/14/2014  . CHEST PAIN 09/26/2010  . HYPERLIPIDEMIA-MIXED 05/14/2009  . HOARSENESS 05/14/2009  . CAROTID BRUIT 05/14/2009  . COPD with emphysema  05/05/2009  . PAF (paroxysmal atrial fibrillation) s/p multiple DCCVs; on Amiodarone 05/05/2009    Orientation RESPIRATION BLADDER Height & Weight    Self, Time, Situation, Place  O2 (4L) Continent 5\' 5"  (165.1 cm) 300 lbs.  BEHAVIORAL SYMPTOMS/MOOD NEUROLOGICAL BOWEL NUTRITION STATUS      Continent  (CCHO)  AMBULATORY STATUS COMMUNICATION OF NEEDS Skin   Supervision Verbally Normal                       Personal Care Assistance Level of Assistance  Bathing, Feeding, Dressing Bathing Assistance: Limited assistance Feeding assistance: Independent Dressing Assistance: Limited assistance     Functional Limitations Info  Sight, Hearing, Speech Sight Info: Adequate Hearing Info: Adequate Speech Info: Adequate    SPECIAL CARE FACTORS FREQUENCY  PT (By licensed PT), OT (By licensed OT)     PT Frequency: 2 OT Frequency: 2            Contractures Contractures Info: Not present    Additional Factors Info  Code Status, Allergies, Insulin Sliding Scale Code Status Info: DNR Allergies Info: Ace Inhibitors, Clindamycin   Insulin Sliding Scale Info: 3 times daily at meals / once at bedtime       Current Medications (11/19/2015):  This is the current hospital active medication list Current Facility-Administered Medications  Medication Dose Route Frequency Provider Last Rate Last Dose  . acetaminophen (TYLENOL) tablet 325 mg  325 mg Oral Daily PRN Norval Morton, MD      . albuterol (PROVENTIL) (2.5 MG/3ML) 0.083% nebulizer solution 2.5 mg  2.5 mg Nebulization Q2H PRN Norval Morton, MD      . allopurinol (ZYLOPRIM) tablet 100 mg  100 mg Oral Daily Norval Morton, MD   100 mg at 11/19/15 1023  .  amiodarone (PACERONE) tablet 200 mg  200 mg Oral Daily Norval Morton, MD   200 mg at 11/19/15 1023  . apixaban (ELIQUIS) tablet 5 mg  5 mg Oral BID Norval Morton, MD   5 mg at 11/19/15 1024  . arformoterol (BROVANA) nebulizer solution 15 mcg  15 mcg Nebulization BID Norval Morton, MD   15 mcg at 11/19/15 [id number]  . benzonatate (TESSALON) capsule 200 mg  200 mg Oral BID PRN Norval Morton, MD      . budesonide (PULMICORT) nebulizer solution 0.5 mg  0.5 mg  Nebulization BID Norval Morton, MD   0.5 mg at 11/19/15 [id number]  . calcium-vitamin D (OSCAL WITH D) 500-200 MG-UNIT per tablet 1 tablet  1 tablet Oral Q breakfast Norval Morton, MD   1 tablet at 11/19/15 1022  . cefTRIAXone (ROCEPHIN) 1 g in dextrose 5 % 50 mL IVPB  1 g Intravenous Q24H Norval Morton, MD 100 mL/hr at 11/19/15 0534 1 g at 11/19/15 0534  . cholecalciferol (VITAMIN D) tablet 2,000 Units  2,000 Units Oral Daily Norval Morton, MD   2,000 Units at 11/19/15 1022  . diltiazem (CARDIZEM CD) 24 hr capsule 180 mg  180 mg Oral Daily Norval Morton, MD   180 mg at 11/19/15 1023  . doxycycline (VIBRA-TABS) tablet 100 mg  100 mg Oral Q12H Rondell Charmayne Sheer, MD   100 mg at 11/19/15 1023  . ezetimibe-simvastatin (VYTORIN) 10-40 MG per tablet 1 tablet  1 tablet Oral QHS Norval Morton, MD   1 tablet at 11/18/15 2242  . ferrous sulfate tablet 325 mg  325 mg Oral BID WC Rondell A Tamala Julian, MD   325 mg at 11/19/15 1022  . furosemide (LASIX) tablet 160 mg  160 mg Oral BID Norval Morton, MD   160 mg at 11/19/15 1022  . guaiFENesin (MUCINEX) 12 hr tablet 600 mg  600 mg Oral BID Norval Morton, MD   600 mg at 11/19/15 1023  . insulin aspart (novoLOG) injection 0-20 Units  0-20 Units Subcutaneous TID WC Bonnielee Haff, MD   20 Units at 11/19/15 0900  . insulin aspart (novoLOG) injection 0-5 Units  0-5 Units Subcutaneous QHS Bonnielee Haff, MD   2 Units at 11/18/15 2244  . insulin aspart (novoLOG) injection 5 Units  5 Units Subcutaneous TID WC Ripudeep K Rai, MD      . insulin glargine (LANTUS) injection 10 Units  10 Units Subcutaneous Daily Ripudeep Krystal Eaton, MD   10 Units at 11/19/15 1024  . ipratropium-albuterol (DUONEB) 0.5-2.5 (3) MG/3ML nebulizer solution 3 mL  3 mL Nebulization Q4H Ripudeep K Rai, MD   3 mL at 11/19/15 1201  . metolazone (ZAROXOLYN) tablet 2.5 mg  2.5 mg Oral Once PRN Norval Morton, MD      . metoprolol tartrate (LOPRESSOR) tablet 50 mg  50 mg Oral BID Norval Morton, MD   50 mg at  11/19/15 1022  . pantoprazole (PROTONIX) EC tablet 40 mg  40 mg Oral Daily Norval Morton, MD   40 mg at 11/19/15 1023  . potassium chloride SA (K-DUR,KLOR-CON) CR tablet 20 mEq  20 mEq Oral TID Norval Morton, MD   20 mEq at 11/19/15 1023  . predniSONE (DELTASONE) tablet 40 mg  40 mg Oral Q breakfast Ripudeep Krystal Eaton, MD   40 mg at 11/19/15 1023  . sodium chloride 0.9 % injection 3 mL  3 mL Intravenous  Witherbee, MD   3 mL at 11/19/15 1025  . vitamin B-12 (CYANOCOBALAMIN) tablet 500 mcg  500 mcg Oral Daily Norval Morton, MD   500 mcg at 11/19/15 1023  . vitamin C (ASCORBIC ACID) tablet 500 mg  500 mg Oral Daily Norval Morton, MD   500 mg at 11/19/15 1022     Discharge Medications: Please see discharge summary for a list of discharge medications.  Relevant Imaging Results:  Relevant Lab Results:   Additional Information    Rigoberto Noel, LCSW

## 2015-11-19 NOTE — Progress Notes (Signed)
Physical Therapy Treatment Patient Details Name: Paige Hardin MRN: [id number] DOB: Jul 26, 1946 Today's Date: 11/19/2015    History of Present Illness Pt is a 69 y/o F admitted for COPD exacerbation.  Pt's PMH includes chronic low back pain, obesity, depression.    PT Comments    Ms. Mcfadden tolerated therapeutic exercises and standing balance exercises w/ supervision.  Pt will benefit from continued skilled PT services to increase functional independence and safety, specifically interventions focused on pt's balance impairments.   Follow Up Recommendations  No PT follow up     Equipment Recommendations  None recommended by PT    Recommendations for Other Services Other (comment) (cardiopulmoary rehab)     Precautions / Restrictions Precautions Precautions: Fall Restrictions Weight Bearing Restrictions: No    Mobility  Bed Mobility               General bed mobility comments: Pt sitting in recliner chair upon PT arrival  Transfers Overall transfer level: Needs assistance Equipment used: Rolling walker (2 wheeled) Transfers: Sit to/from Stand Sit to Stand: Supervision         General transfer comment: Supervision for pt's safety.  Cues for hand placement.  Ambulation/Gait                 Stairs            Wheelchair Mobility    Modified Rankin (Stroke Patients Only)       Balance Overall balance assessment: Needs assistance Sitting-balance support: Feet supported Sitting balance-Leahy Scale: Good     Standing balance support: During functional activity Standing balance-Leahy Scale: Fair   Single Leg Stance - Right Leg: 30 (1 hand support) Single Leg Stance - Left Leg: 30 (1 hand support)       Rhomberg - Eyes Closed: 30 (1 hand support)        Cognition Arousal/Alertness: Awake/alert Behavior During Therapy: WFL for tasks assessed/performed Overall Cognitive Status: Within Functional Limits for tasks assessed                       Exercises General Exercises - Lower Extremity Ankle Circles/Pumps: AROM;Both;Seated;10 reps Long Arc Quad: AROM;Both;10 reps;Seated Hip Flexion/Marching: AROM;Both;Seated;20 reps;Standing    General Comments        Pertinent Vitals/Pain Pain Assessment: No/denies pain Pain Intervention(s): Limited activity within patient's tolerance;Monitored during session    Home Living                      Prior Function            PT Goals (current goals can now be found in the care plan section) Acute Rehab PT Goals Patient Stated Goal: to go back to ALF PT Goal Formulation: With patient Time For Goal Achievement: 12/01/15 Potential to Achieve Goals: Good Progress towards PT goals: Progressing toward goals    Frequency  Min 2X/week    PT Plan Current plan remains appropriate    Co-evaluation             End of Session Equipment Utilized During Treatment: Oxygen Activity Tolerance: Patient tolerated treatment well Patient left: in chair;with call bell/phone within reach;with nursing/sitter in room     Time: 1349-1359 PT Time Calculation (min) (ACUTE ONLY): 10 min  Charges:  $Therapeutic Exercise: 8-22 mins                    G Codes:      Caryl Pina  [place] Guardian, DPT [phone] Pager: [phone] 11/19/2015, 3:42 PM

## 2015-11-19 NOTE — Clinical Social Work Note (Signed)
Per MD patient ready to DC back to [facility]. RN, patient/family, and facility (RN Levada Dy has approved patient's return) notified of patient's DC. RN given number for report. DC packet on patient's chart. Ambulance transport requested for patient. CSW signing off at this time.   Liz Beach MSW, [place], [place], [id number]

## 2015-11-19 NOTE — Care Management Important Message (Signed)
Important Message  Patient Details  Name: Paige Hardin MRN: [id number] Date of Birth: December 06, 1945   Medicare Important Message Given:  Yes    Nathen May 11/19/2015, 3:16 PM

## 2015-11-19 NOTE — Discharge Summary (Addendum)
Physician Discharge Summary   Patient ID: Paige Hardin MRN: [id number] DOB/AGE: [birth year] 69 y.o.  Admit date: 11/16/2015 Discharge date: 11/19/2015  Primary Care Physician:  Stephens Shire, MD  Discharge Diagnoses:      . COPD with acute exacerbation (HCC)   Klebsiella UTI . Chronic renal disease, stage III . Morbid obesity- BMI 50 . PAF (paroxysmal atrial fibrillation) s/p multiple DCCVs; on Amiodarone . Iron deficiency anemia . Type 2 diabetes mellitus with hyperglycemia (HCC)  Consults: none  Recommendations for Outpatient Follow-up:  1. Please repeat CBC/BMET at next visit 2. Please note patient has hyperglycemia due to the prednisone. Hence placed on NovoLog meal coverage and sliding scale correction factor, continue amaryl.  3. Once patient has been tapered off the prednisone, please follow blood sugars closely and adjust insulin regimen.     DIET: diabetic/carb modified diet. CCHO diet per the facility    Allergies:   Allergies  Allergen Reactions  . Ace Inhibitors Cough  . Clindamycin Rash     DISCHARGE MEDICATIONS: Current Discharge Medication List    START taking these medications   Details  benzonatate (TESSALON) 200 MG capsule Take 1 capsule (200 mg total) by mouth 2 (two) times daily as needed for cough. Qty: 30 capsule, Refills: 0    cefUROXime (CEFTIN) 500 MG tablet Take 1 tablet (500 mg total) by mouth 2 (two) times daily with a meal. X 7days Qty: 14 tablet, Refills: 0    doxycycline (VIBRA-TABS) 100 MG tablet Take 1 tablet (100 mg total) by mouth 2 (two) times daily. X 7days Qty: 14 tablet, Refills: 0    predniSONE (DELTASONE) 10 MG tablet Prednisone dosing: Take  Prednisone 40mg  (4 tabs) x 1 days, then taper to 30mg  (3 tabs) x 3 days, then 20mg  (2 tabs) x 3days, then 10mg  (1 tab) x 3days, then OFF. Qty: 22 tablet, Refills: 0      CONTINUE these medications which have CHANGED   Details  !! insulin aspart (NOVOLOG) 100 UNIT/ML injection  Inject 5 Units into the skin 3 (three) times daily with meals. Qty: 10 mL, Refills: 11    !! insulin aspart (NOVOLOG) 100 UNIT/ML injection Inject 0-9 Units into the skin 3 (three) times daily with meals. Correction factor Sliding scale CBG 70 - 120: 0 units CBG 121 - 150: 1 unit,  CBG 151 - 200: 2 units,  CBG 201 - 250: 3 units,  CBG 251 - 300: 5 units,  CBG 301 - 350: 7 units,  CBG 351 - 400: 9 units   CBG > 400: 9 units and notify your MD Qty: 10 mL, Refills: 11     !! - Potential duplicate medications found. Please discuss with provider.    CONTINUE these medications which have NOT CHANGED   Details  albuterol (PROVENTIL) (2.5 MG/3ML) 0.083% nebulizer solution Take 2.5 mg by nebulization every 6 (six) hours as needed for wheezing or shortness of breath.    allopurinol (ZYLOPRIM) 100 MG tablet Take 1 tablet (100 mg total) by mouth daily.    amiodarone (PACERONE) 200 MG tablet Take 1 tablet (200 mg total) by mouth daily.   Associated Diagnoses: PAF (paroxysmal atrial fibrillation) (HCC)    apixaban (ELIQUIS) 5 MG TABS tablet Take 1 tablet (5 mg total) by mouth 2 (two) times daily. Qty: 60 tablet    Ascorbic Acid (VITAMIN C) 500 MG CAPS Take 500 mg by mouth daily.     budesonide-formoterol (SYMBICORT) 160-4.5 MCG/ACT inhaler Inhale 2 puffs into  the lungs 2 (two) times daily. Qty: 1 Inhaler, Refills: 5    calcium-vitamin D (OSCAL WITH D) 500-200 MG-UNIT per tablet Take 1 tablet by mouth daily with breakfast.    Cholecalciferol (VITAMIN D) 2000 UNITS tablet Take 2,000 Units by mouth daily.    diltiazem (CARDIZEM CD) 180 MG 24 hr capsule Take 1 capsule (180 mg total) by mouth daily. Qty: 30 capsule, Refills: 3    ezetimibe-simvastatin (VYTORIN) 10-40 MG per tablet Take 1 tablet by mouth at bedtime.     ferrous sulfate 325 (65 FE) MG tablet Take 325 mg by mouth 2 (two) times daily with a meal.     furosemide (LASIX) 80 MG tablet Take 2 tablets (160 mg total) by mouth 2 (two) times  daily. MAKE SURE TO TAKE AT 8 AM AND 2 PM   Associated Diagnoses: Acute on chronic diastolic heart failure (HCC)    glimepiride (AMARYL) 2 MG tablet Take 2 mg by mouth daily with breakfast.     guaiFENesin (DIABETIC TUSSIN EX) 100 MG/5ML liquid Take 200 mg by mouth 3 (three) times daily as needed for cough.    metolazone (ZAROXOLYN) 2.5 MG tablet Take 1 tablet only if weight is greater or equal to 300 lbs as needed Qty: 30 tablet, Refills: 3   Associated Diagnoses: Chronic diastolic heart failure (HCC)    metoprolol (LOPRESSOR) 50 MG tablet Take 1 tablet (50 mg total) by mouth 2 (two) times daily. Qty: 60 tablet, Refills: 3    Multiple Vitamins-Iron (MULTIVITAMIN/IRON PO) Take 1 tablet by mouth daily.    omeprazole (PRILOSEC) 20 MG capsule Take 20 mg by mouth daily.    potassium chloride SA (K-DUR,KLOR-CON) 20 MEQ tablet Take 20 mEq by mouth 3 (three) times daily. MAKE SURE TO TAKE EXTRA 20 MEQ ON METOLAZONE DAYS    PROAIR HFA 108 (90 BASE) MCG/ACT inhaler Inhale 2 puffs into the lungs every 6 (six) hours as needed for wheezing.     sitaGLIPtin (JANUVIA) 100 MG tablet Take 100 mg by mouth daily.    tiotropium (SPIRIVA) 18 MCG inhalation capsule Place 1 capsule (18 mcg total) into inhaler and inhale daily. Qty: 30 capsule, Refills: 5    vitamin B-12 (CYANOCOBALAMIN) 500 MCG tablet Take 500 mcg by mouth daily.    acetaminophen (TYLENOL) 325 MG tablet Take 325 mg by mouth daily as needed for mild pain.          Brief H and P: For complete details please refer to admission H and P, but in brief Patient is a 69 year old female with a past medical history significant for hypertension, atrial fibrillation on Eliquis, hyperlipidemia, diabetes mellitus type 2, diastolic congestive heart failure, COPD oxygen dependent 2 L of nasal cannula oxygen; who presented with complaints of shortness of breath. Symptoms initially started approximately 1 week ago with a productive cough with Savell  sputum. Associated symptoms included low-grade fever of 99.2F, wheezing, nasal congestion, postnasal drip, lower leg swelling. Symptoms progressively worsened as the week went on and the patient required more and more oxygen to maintain for O2 saturations Upon admission to the emergency department patient was seen have WBC count of 18.6, elevated blood glucose of 499, anion gap of 10, BMP of 166, chest x-ray revealing pulmonary congestion.   Hospital Course:   Acute hypoxic respiratory failure, COPD with acute exacerbation - wheezing has entirely improved, continue nebs, patient placed on prednisone with taper ( bili to have blood sugars closely monitored with prednisone due to hyperglycemia) -  Blood cultures 1/2 GPC in clusters, final cultures showed contaminant, quiet place negative staph - Continue doxycycline and Ceftin for 7 days. - Urine strep antigen and urine Legionella negative.   Klebsiella UTI: - Urine culture showed Klebsiella, change to Ceftin at the time of discharge.  PAF (paroxysmal atrial fibrillation) s/p multiple DCCVs; on Amiodarone:  - Rate Controlled on amiodarone. Patient has a chadsvasc score of 5 and is on chronic anticoagulation of Eliquis. - Continue Eliquis, amiodarone, diltiazem  Chronic congestive heart failure:  - last echocardiogram in 03/2015 showing EF of 55-60% with no wall motion abnormalities. - Metolazone as needed for weights greater than 300 per home regimen - Negative balance of 5.2 L, continue furosemideand metolazone  Hypertension: Stable -Continue metoprolol, furosemide, and other meds as seen above  Diabetes mellitus type 2 with hyperglycemia. Patient found to have a blood glucose on admission of 499 no anion gap.  -- Continue sliding scale insulin and meal coverage, continueAmaryl - Hemoglobin A1c 9.7 - Monitor closely with steroids  Chronic renal disease, stage III: Stable. Creatinine appears to be at baseline around 1.5    Iron  deficiency anemia: - H&H stable   Hyperlipidemia -Continue atorvastatin  Morbid obesity- BMI 50  Day of Discharge BP 128/49 mmHg  Pulse 83  Temp(Src) 98.3 F (36.8 C) (Oral)  Resp 18  Ht 5\' 5"  (1.651 m)  Wt 134.083 kg (295 lb 9.6 oz)  BMI 49.19 kg/m2  SpO2 92%  Physical Exam: General: Alert and awake oriented x3 not in any acute distress. HEENT: anicteric sclera, pupils reactive to light and accommodation CVS: S1-S2 clear no murmur rubs or gallops Chest: clear to auscultation bilaterally, no wheezing rales or rhonchi Abdomen: soft nontender, nondistended, normal bowel sounds Extremities: no cyanosis, clubbing or edema noted bilaterally Neuro: Cranial nerves II-XII intact, no focal neurological deficits   The results of significant diagnostics from this hospitalization (including imaging, microbiology, ancillary and laboratory) are listed below for reference.    LAB RESULTS: Basic Metabolic Panel:  Recent Labs Lab 11/18/15 0428 11/19/15 0437  NA 137 135  K 4.4 4.4  CL 95* 91*  CO2 29 30  GLUCOSE 205* 393*  BUN 40* 41*  CREATININE 1.54* 1.58*  CALCIUM 9.2 9.4   Liver Function Tests: No results for input(s): AST, ALT, ALKPHOS, BILITOT, PROT, ALBUMIN in the last 168 hours. No results for input(s): LIPASE, AMYLASE in the last 168 hours. No results for input(s): AMMONIA in the last 168 hours. CBC:  Recent Labs Lab 11/16/15 0246 11/17/15 0858 11/18/15 0428  WBC 18.6* 13.1* 10.7*  NEUTROABS 16.7*  --   --   HGB 11.5* 11.0* 10.3*  HCT 35.7* 34.3* 33.6*  MCV 94.2 95.3 97.4  PLT 247 239 242   Cardiac Enzymes:  Recent Labs Lab 11/16/15 0246  TROPONINI <0.03   BNP: Invalid input(s): POCBNP CBG:  Recent Labs Lab 11/18/15 2116 11/19/15 0751  GLUCAP 248* 355*    Significant Diagnostic Studies:  Dg Chest Portable 1 View  11/16/2015  CLINICAL DATA:  Shortness of breath with wheezing. EXAM: PORTABLE CHEST 1 VIEW COMPARISON:  10/03/2015 FINDINGS:  Chronic cardiomegaly with pulmonary venous congestion including cephalized blood flow. No edema, effusion, or pneumothorax. Stable aortic and hilar contours. A right lower lobe pulmonary nodule seen on chest CT 10/07/2015 is not visualized. IMPRESSION: Chronic cardiomegaly with pulmonary venous congestion. Electronically Signed   By: Monte Fantasia M.D.   On: 11/16/2015 02:39    2D ECHO:   Disposition and  Follow-up: Discharge Instructions    AMB Referral to [facility] [place] [facility]    Complete by:  As directed   Please assign to [facility] LCSW for follow up at [place]. Consents signed. Please call with questions. Marthenia Rolling, [place], [facility] [id number]  Reason for consult:  Please assign to [facility] LCSW for followup at ALF  Diagnoses of:  COPD/ Pneumonia  Expected date of contact:  1-3 days (reserved for hospital discharges)     Diet Carb Modified    Complete by:  As directed      Discharge instructions    Complete by:  As directed   Please continue Novolog 5units three times a day with meals and sliding scale while you are on prednisone. You can resume your previous insulin regimen once you are off prednisone.   It is VERY IMPORTANT that you follow up with a PCP on a regular basis.  Check your blood glucoses before each meal and at bedtime and maintain a log of your readings.  Bring this log with you when you follow up with your PCP so that he or she can adjust your insulin at your follow up visit.     Increase activity slowly    Complete by:  As directed             DISPOSITION: *ALF   DISCHARGE FOLLOW-UP Follow-up Information    Follow up with BURNETT,BRENT A, MD. Schedule an appointment as soon as possible for a visit in 2 weeks.   Specialty:  Family Medicine   Why:  for hospital follow-up   Contact information:   [id number] [place] [address] [phone]        Time spent on Discharge: 60mins    Signed:   Maisha Bogen M.D. [facility] 11/19/2015, 11:52 AM Pager: [phone]

## 2015-11-21 LAB — CULTURE, BLOOD (ROUTINE X 2): Culture: NO GROWTH

## 2015-11-21 LAB — CULTURE, RESPIRATORY W GRAM STAIN

## 2015-11-21 LAB — CULTURE, RESPIRATORY

## 2015-11-23 ENCOUNTER — Encounter: Payer: Self-pay | Admitting: *Deleted

## 2015-11-24 ENCOUNTER — Other Ambulatory Visit: Payer: Self-pay | Admitting: *Deleted

## 2015-11-24 NOTE — Patient Outreach (Signed)
[facility]) Care Management  11/24/2015  Paige Hardin 11/25/1946 [id number]  CSW received a new referral on patient from [facility] Liaison with [place] [facility], indicating that patient would benefit from social work services and resources to assist with possible discharge planning needs and services.  Patient was recently placed at [facility], [place], to receive short-term rehabilitative services.  The plan is for patient to return home to live with husband, Paige Hardin, independently at time of discharge CSW made an initial attempt to try and contact patient today to perform phone assessment, as well as assess and assist with social needs and services, without success.  A HIPAA complaint message was left for patient on voicemail.  CSW is currently awaiting a return call.  Paige Hardin, Paige Hardin, Paige Hardin, Paige Hardin  Licensed Education officer, [facility]  Mailing [place] [address] [place], [place] [address] Physical Address-[address], [place], [place] [address] Toll Free Main # [phone] Fax # [phone] Cell # [phone]  Fax # [phone]  [email or web address]

## 2015-11-30 ENCOUNTER — Other Ambulatory Visit: Payer: Self-pay | Admitting: *Deleted

## 2015-11-30 ENCOUNTER — Encounter: Payer: Self-pay | Admitting: *Deleted

## 2015-11-30 DIAGNOSIS — E1165 Type 2 diabetes mellitus with hyperglycemia: Secondary | ICD-10-CM | POA: Diagnosis not present

## 2015-11-30 DIAGNOSIS — N183 Chronic kidney disease, stage 3 (moderate): Secondary | ICD-10-CM | POA: Diagnosis not present

## 2015-11-30 DIAGNOSIS — N390 Urinary tract infection, site not specified: Secondary | ICD-10-CM | POA: Diagnosis not present

## 2015-11-30 DIAGNOSIS — E1122 Type 2 diabetes mellitus with diabetic chronic kidney disease: Secondary | ICD-10-CM | POA: Diagnosis not present

## 2015-11-30 DIAGNOSIS — I5032 Chronic diastolic (congestive) heart failure: Secondary | ICD-10-CM | POA: Diagnosis not present

## 2015-11-30 DIAGNOSIS — I480 Paroxysmal atrial fibrillation: Secondary | ICD-10-CM | POA: Diagnosis not present

## 2015-11-30 DIAGNOSIS — Z794 Long term (current) use of insulin: Secondary | ICD-10-CM | POA: Diagnosis not present

## 2015-11-30 DIAGNOSIS — J449 Chronic obstructive pulmonary disease, unspecified: Secondary | ICD-10-CM | POA: Diagnosis not present

## 2015-11-30 NOTE — Patient Outreach (Signed)
[facility]) Care Management  [facility] Social Work  11/30/2015  LORELLE MACALUSO 06/19/1946 [id number]  Subjective:    "My doctor's office said they were going to send someone out to see me, but I don't know what for"?  Objective:   CSW agreed to follow patient while residing at [facility] [place] where patient was recently placed to receive short-term rehabilitative services, but patient reported that she plans to remain at the facility for long-term care services.  Current Medications:  Current Outpatient Prescriptions  Medication Sig Dispense Refill  . acetaminophen (TYLENOL) 325 MG tablet Take 325 mg by mouth daily as needed for mild pain.     Marland Kitchen albuterol (PROVENTIL) (2.5 MG/3ML) 0.083% nebulizer solution Take 2.5 mg by nebulization every 6 (six) hours as needed for wheezing or shortness of breath.    . allopurinol (ZYLOPRIM) 100 MG tablet Take 1 tablet (100 mg total) by mouth daily.    Marland Kitchen amiodarone (PACERONE) 200 MG tablet Take 1 tablet (200 mg total) by mouth daily.    Marland Kitchen apixaban (ELIQUIS) 5 MG TABS tablet Take 1 tablet (5 mg total) by mouth 2 (two) times daily. 60 tablet   . Ascorbic Acid (VITAMIN C) 500 MG CAPS Take 500 mg by mouth daily.     . benzonatate (TESSALON) 200 MG capsule Take 1 capsule (200 mg total) by mouth 2 (two) times daily as needed for cough. 30 capsule 0  . budesonide-formoterol (SYMBICORT) 160-4.5 MCG/ACT inhaler Inhale 2 puffs into the lungs 2 (two) times daily. 1 Inhaler 5  . calcium-vitamin D (OSCAL WITH D) 500-200 MG-UNIT per tablet Take 1 tablet by mouth daily with breakfast.    . cefUROXime (CEFTIN) 500 MG tablet Take 1 tablet (500 mg total) by mouth 2 (two) times daily with a meal. X 7days 14 tablet 0  . Cholecalciferol (VITAMIN D) 2000 UNITS tablet Take 2,000 Units by mouth daily.    Marland Kitchen diltiazem (CARDIZEM CD) 180 MG 24 hr capsule Take 1 capsule (180 mg total) by mouth daily. 30 capsule 3  . doxycycline (VIBRA-TABS) 100  MG tablet Take 1 tablet (100 mg total) by mouth 2 (two) times daily. X 7days 14 tablet 0  . ezetimibe-simvastatin (VYTORIN) 10-40 MG per tablet Take 1 tablet by mouth at bedtime.     . ferrous sulfate 325 (65 FE) MG tablet Take 325 mg by mouth 2 (two) times daily with a meal.     . furosemide (LASIX) 80 MG tablet Take 2 tablets (160 mg total) by mouth 2 (two) times daily. MAKE SURE TO TAKE AT 8 AM AND 2 PM    . glimepiride (AMARYL) 2 MG tablet Take 2 mg by mouth daily with breakfast.     . guaiFENesin (DIABETIC TUSSIN EX) 100 MG/5ML liquid Take 200 mg by mouth 3 (three) times daily as needed for cough.    . insulin aspart (NOVOLOG) 100 UNIT/ML injection Inject 5 Units into the skin 3 (three) times daily with meals. 10 mL 11  . insulin aspart (NOVOLOG) 100 UNIT/ML injection Inject 0-9 Units into the skin 3 (three) times daily with meals. Correction factor Sliding scale CBG 70 - 120: 0 units CBG 121 - 150: 1 unit,  CBG 151 - 200: 2 units,  CBG 201 - 250: 3 units,  CBG 251 - 300: 5 units,  CBG 301 - 350: 7 units,  CBG 351 - 400: 9 units   CBG > 400: 9 units and notify your MD  10 mL 11  . metolazone (ZAROXOLYN) 2.5 MG tablet Take 1 tablet only if weight is greater or equal to 300 lbs as needed 30 tablet 3  . metoprolol (LOPRESSOR) 50 MG tablet Take 1 tablet (50 mg total) by mouth 2 (two) times daily. 60 tablet 3  . Multiple Vitamins-Iron (MULTIVITAMIN/IRON PO) Take 1 tablet by mouth daily.    Marland Kitchen omeprazole (PRILOSEC) 20 MG capsule Take 20 mg by mouth daily.    . potassium chloride SA (K-DUR,KLOR-CON) 20 MEQ tablet Take 20 mEq by mouth 3 (three) times daily. MAKE SURE TO TAKE EXTRA 20 MEQ ON METOLAZONE DAYS    . predniSONE (DELTASONE) 10 MG tablet Prednisone dosing: Take  Prednisone 71m (4 tabs) x 1 days, then taper to 331m(3 tabs) x 3 days, then 2079m2 tabs) x 3days, then 64m65m tab) x 3days, then OFF. 22 tablet 0  . PROAIR HFA 108 (90 BASE) MCG/ACT inhaler Inhale 2 puffs into the lungs every 6 (six)  hours as needed for wheezing.     . sitaGLIPtin (JANUVIA) 100 MG tablet Take 100 mg by mouth daily.    . tiMarland Kitchentropium (SPIRIVA) 18 MCG inhalation capsule Place 1 capsule (18 mcg total) into inhaler and inhale daily. 30 capsule 5  . vitamin B-12 (CYANOCOBALAMIN) 500 MCG tablet Take 500 mcg by mouth daily.     No current facility-administered medications for this visit.    Functional Status:  In your present state of health, do you have any difficulty performing the following activities: 11/30/2015 11/16/2015  Hearing? N N  Vision? N N  Difficulty concentrating or making decisions? Y N  Walking or climbing stairs? Y Y  Dressing or bathing? N N  Doing errands, shopping? Y Y Tem[facility] Donning[place] Food and eating ? Y -  Using the Toilet? N -  In the past six months, have you accidently leaked urine? Y -  Do you have problems with loss of bowel control? N -  Managing your Medications? Y -  Managing your Finances? Y -  Housekeeping or managing your Housekeeping? Y -    Fall/Depression Screening:  PHQ 2/9 Scores 11/30/2015  PHQ - 2 Score 0    Assessment:   CSW was able to make initial contact with patient today to perform assessment, as well as assess and assist with social work needs and services.  [facility] to receive short-term rehabilitative services.  CSW introduced self, explained role and types of services provided through [facility]  CSW further explained to patient that CSW works with [facility] Liaison with [facility], that met with patient while hospitalized. CSW then explained the reason for the call, indicating that Ms. [place]ada Craneught that patient would benefit from social work services and resources to assist with possible discharge planning needs and services.  CSW obtained two HIPAA compliant identifiers from patient, which included  patient's name and date of birth. Patient admitted that she was aware that someone would be contacting her [facility] was unsure of the reason.  Patient indicated that she was a bit confused about the nature of the call, as patient reported, "I don't plan on leaving from here, I live here".  Patient went on to say that there is a physical therapist on staff at [facility] will be working with, not needing  to leave the facility.  Patient reported that she has a "driver" through [facility] that takes her to all her physician appointments, reporting, "As a matter of fact, I have an appointment later today with Dr. Tollie Pizza".  CSW is aware that patient's Primary Care Physician is Dr. Juanita Craver.  Patient admitted that she is unable to identify any social work specific needs at present, but agreed to take down CSW's contact information for future reference.  Plan:   CSW will perform a case closure on patient, as all goals of treatment have been met from social work standpoint and no additional social work needs have been identified at this time. CSW will notify patient's RNCM with [facility], Marthenia Rolling of CSW's plans to close patient's case. CSW will fax a correspondence letter to patient's Primary Care Physician, Dr. Juanita Craver to ensure that Dr. Tollie Pizza is aware of CSW's involvement with patient's care. CSW will submit a case closure request to Lurline Del, Care Management Assistant with [facility], in the form of an [employer].    Nat Christen, BSW, MSW, LCSW Licensed Education officer, environmental [facility] System  Mailing [facility] [address] [place], [place] [address] Physical Address-[address], [place], [place] [address] Toll Free Main # [phone] Fax # [phone] Cell # [phone]  Fax # [phone]   [email or web address]

## 2015-12-06 DIAGNOSIS — R5383 Other fatigue: Secondary | ICD-10-CM | POA: Diagnosis not present

## 2015-12-06 DIAGNOSIS — J441 Chronic obstructive pulmonary disease with (acute) exacerbation: Secondary | ICD-10-CM | POA: Diagnosis not present

## 2015-12-06 DIAGNOSIS — R262 Difficulty in walking, not elsewhere classified: Secondary | ICD-10-CM | POA: Diagnosis not present

## 2015-12-06 DIAGNOSIS — M6281 Muscle weakness (generalized): Secondary | ICD-10-CM | POA: Diagnosis not present

## 2015-12-06 DIAGNOSIS — R29898 Other symptoms and signs involving the musculoskeletal system: Secondary | ICD-10-CM | POA: Diagnosis not present

## 2015-12-07 ENCOUNTER — Ambulatory Visit: Payer: Federal, State, Local not specified - PPO | Admitting: Emergency Medicine

## 2015-12-08 DIAGNOSIS — R5383 Other fatigue: Secondary | ICD-10-CM | POA: Diagnosis not present

## 2015-12-08 DIAGNOSIS — M6281 Muscle weakness (generalized): Secondary | ICD-10-CM | POA: Diagnosis not present

## 2015-12-08 DIAGNOSIS — J441 Chronic obstructive pulmonary disease with (acute) exacerbation: Secondary | ICD-10-CM | POA: Diagnosis not present

## 2015-12-08 DIAGNOSIS — R262 Difficulty in walking, not elsewhere classified: Secondary | ICD-10-CM | POA: Diagnosis not present

## 2015-12-08 DIAGNOSIS — R29898 Other symptoms and signs involving the musculoskeletal system: Secondary | ICD-10-CM | POA: Diagnosis not present

## 2015-12-09 DIAGNOSIS — R29898 Other symptoms and signs involving the musculoskeletal system: Secondary | ICD-10-CM | POA: Diagnosis not present

## 2015-12-09 DIAGNOSIS — R5383 Other fatigue: Secondary | ICD-10-CM | POA: Diagnosis not present

## 2015-12-09 DIAGNOSIS — M6281 Muscle weakness (generalized): Secondary | ICD-10-CM | POA: Diagnosis not present

## 2015-12-09 DIAGNOSIS — J441 Chronic obstructive pulmonary disease with (acute) exacerbation: Secondary | ICD-10-CM | POA: Diagnosis not present

## 2015-12-09 DIAGNOSIS — R262 Difficulty in walking, not elsewhere classified: Secondary | ICD-10-CM | POA: Diagnosis not present

## 2015-12-10 DIAGNOSIS — R29898 Other symptoms and signs involving the musculoskeletal system: Secondary | ICD-10-CM | POA: Diagnosis not present

## 2015-12-10 DIAGNOSIS — M6281 Muscle weakness (generalized): Secondary | ICD-10-CM | POA: Diagnosis not present

## 2015-12-10 DIAGNOSIS — R5383 Other fatigue: Secondary | ICD-10-CM | POA: Diagnosis not present

## 2015-12-10 DIAGNOSIS — J441 Chronic obstructive pulmonary disease with (acute) exacerbation: Secondary | ICD-10-CM | POA: Diagnosis not present

## 2015-12-10 DIAGNOSIS — R262 Difficulty in walking, not elsewhere classified: Secondary | ICD-10-CM | POA: Diagnosis not present

## 2015-12-13 DIAGNOSIS — J441 Chronic obstructive pulmonary disease with (acute) exacerbation: Secondary | ICD-10-CM | POA: Diagnosis not present

## 2015-12-13 DIAGNOSIS — M6281 Muscle weakness (generalized): Secondary | ICD-10-CM | POA: Diagnosis not present

## 2015-12-13 DIAGNOSIS — R5383 Other fatigue: Secondary | ICD-10-CM | POA: Diagnosis not present

## 2015-12-13 DIAGNOSIS — R29898 Other symptoms and signs involving the musculoskeletal system: Secondary | ICD-10-CM | POA: Diagnosis not present

## 2015-12-13 DIAGNOSIS — R262 Difficulty in walking, not elsewhere classified: Secondary | ICD-10-CM | POA: Diagnosis not present

## 2015-12-15 DIAGNOSIS — R5383 Other fatigue: Secondary | ICD-10-CM | POA: Diagnosis not present

## 2015-12-15 DIAGNOSIS — R262 Difficulty in walking, not elsewhere classified: Secondary | ICD-10-CM | POA: Diagnosis not present

## 2015-12-15 DIAGNOSIS — R29898 Other symptoms and signs involving the musculoskeletal system: Secondary | ICD-10-CM | POA: Diagnosis not present

## 2015-12-15 DIAGNOSIS — M6281 Muscle weakness (generalized): Secondary | ICD-10-CM | POA: Diagnosis not present

## 2015-12-15 DIAGNOSIS — J441 Chronic obstructive pulmonary disease with (acute) exacerbation: Secondary | ICD-10-CM | POA: Diagnosis not present

## 2015-12-17 DIAGNOSIS — R5383 Other fatigue: Secondary | ICD-10-CM | POA: Diagnosis not present

## 2015-12-17 DIAGNOSIS — R29898 Other symptoms and signs involving the musculoskeletal system: Secondary | ICD-10-CM | POA: Diagnosis not present

## 2015-12-17 DIAGNOSIS — J441 Chronic obstructive pulmonary disease with (acute) exacerbation: Secondary | ICD-10-CM | POA: Diagnosis not present

## 2015-12-17 DIAGNOSIS — M6281 Muscle weakness (generalized): Secondary | ICD-10-CM | POA: Diagnosis not present

## 2015-12-17 DIAGNOSIS — R262 Difficulty in walking, not elsewhere classified: Secondary | ICD-10-CM | POA: Diagnosis not present

## 2015-12-20 ENCOUNTER — Telehealth: Payer: Self-pay | Admitting: Emergency Medicine

## 2015-12-20 DIAGNOSIS — R262 Difficulty in walking, not elsewhere classified: Secondary | ICD-10-CM | POA: Diagnosis not present

## 2015-12-20 DIAGNOSIS — R29898 Other symptoms and signs involving the musculoskeletal system: Secondary | ICD-10-CM | POA: Diagnosis not present

## 2015-12-20 DIAGNOSIS — R5383 Other fatigue: Secondary | ICD-10-CM | POA: Diagnosis not present

## 2015-12-20 DIAGNOSIS — J441 Chronic obstructive pulmonary disease with (acute) exacerbation: Secondary | ICD-10-CM | POA: Diagnosis not present

## 2015-12-20 DIAGNOSIS — M6281 Muscle weakness (generalized): Secondary | ICD-10-CM | POA: Diagnosis not present

## 2015-12-20 NOTE — Telephone Encounter (Signed)
Attempted to contact the number that was left for [facility]. The number is for [employer]. lmtcb x1 for pt.

## 2015-12-21 NOTE — Telephone Encounter (Signed)
ATC Juliann Pulse, number listed for [employer].  Kathy's number is not listed under pt's additional contact information.

## 2015-12-22 DIAGNOSIS — R5383 Other fatigue: Secondary | ICD-10-CM | POA: Diagnosis not present

## 2015-12-22 DIAGNOSIS — M6281 Muscle weakness (generalized): Secondary | ICD-10-CM | POA: Diagnosis not present

## 2015-12-22 DIAGNOSIS — R29898 Other symptoms and signs involving the musculoskeletal system: Secondary | ICD-10-CM | POA: Diagnosis not present

## 2015-12-22 DIAGNOSIS — J441 Chronic obstructive pulmonary disease with (acute) exacerbation: Secondary | ICD-10-CM | POA: Diagnosis not present

## 2015-12-22 DIAGNOSIS — R262 Difficulty in walking, not elsewhere classified: Secondary | ICD-10-CM | POA: Diagnosis not present

## 2015-12-22 NOTE — Telephone Encounter (Signed)
lmtcb x1 for pt. 

## 2015-12-23 ENCOUNTER — Encounter: Payer: Self-pay | Admitting: Emergency Medicine

## 2015-12-23 ENCOUNTER — Ambulatory Visit (INDEPENDENT_AMBULATORY_CARE_PROVIDER_SITE_OTHER): Payer: Medicare Other | Admitting: Emergency Medicine

## 2015-12-23 VITALS — BP 128/88 | HR 69 | Ht 65.5 in | Wt 300.0 lb

## 2015-12-23 DIAGNOSIS — R911 Solitary pulmonary nodule: Secondary | ICD-10-CM

## 2015-12-23 DIAGNOSIS — J438 Other emphysema: Secondary | ICD-10-CM

## 2015-12-23 NOTE — Telephone Encounter (Signed)
Left message for patient to call back  

## 2015-12-23 NOTE — Assessment & Plan Note (Signed)
Most recent CT scan from 10/07/15 shows a small increase in size to 10 mm. I suspected based on the slow progression this is a highly differentiated adenocarcinoma. She would tolerate an intervention poorly but it may be relevant to consider biopsy or other intervention if it continues to enlarge. We will repeat her CT scan at 6 months and decide next steps depending on its progression

## 2015-12-23 NOTE — Progress Notes (Signed)
Subjective:    Patient ID: Paige Hardin, female    DOB: Apr 24, 1946, 70 y.o.   MRN: [id number]  HPI OFFice Note [facility] 10/2014 The patient comes in today for follow-up of her known COPD with chronic respiratory failure. She also has known chronic congestive heart failure, and is monitoring her fluid balance very carefully. She has been compliant on her bronchodilator regimen and oxygen, and feels that she is near her usual baseline. She has not had an acute exacerbation or pulmonary infection according to her history since the last visit. She has been trying to exercise more regularly, and is even walking around her building.  [name] 05/18/15 -- Follow up visit for pt followed by Dr Gwenette Greet for COPD, chronic resp failure. She has had some congestion and hoarseness this spring. She is having dry cough, is not currently on allergy. She is stable on spiriva and symbicort, has some associated hoarseness. She goes to CHF clinic, has been adjusting diuretics. She believes that her breathing has been good, denies wheeze, has been on 2L/min. Uses SABA very rarely.   [name] 12/23/15 -- follow-up visit for COPD and associated chronic hypoxemic respiratory failure. She also has allergic rhinitis with associated hoarseness and occasional cough.  She tells me that she was admitted to [facility] in 12/16 for an AE COPD, sinusitis, UTI.  She underwent chest CT 10/07/15 that I have reviewed personally, shows a small increase in size to 67mm. Needs a repeat CT in 6 months. No family hx lung CA. She reports that her breathing has been difficult with exertion. She has hypoxemia with exertion that has been documented at [employer]. She is usually on 2L/min pulsed at all times. ? Whether she is triggering the pulse?  She is on symbicort and spiriva. Still coughing some. Rare albuterol.    Review of Systems  Constitutional: Negative for fever and unexpected weight change.  HENT: Positive for congestion and postnasal drip. Negative for  dental problem, ear pain, nosebleeds, rhinorrhea, sinus pressure, sneezing, sore throat and trouble swallowing.   Eyes: Negative for redness and itching.  Respiratory: Positive for cough and shortness of breath. Negative for chest tightness and wheezing.   Cardiovascular: Negative for palpitations and leg swelling.  Gastrointestinal: Negative for nausea and vomiting.  Genitourinary: Negative for dysuria.  Musculoskeletal: Negative for joint swelling.  Skin: Negative for rash.  Neurological: Negative for headaches.  Hematological: Does not bruise/bleed easily.  Psychiatric/Behavioral: Negative for dysphoric mood. The patient is not nervous/anxious.        Objective:   Physical Exam  Filed Vitals:   12/23/15 1521 12/23/15 1522  BP:  128/88  Pulse:  69  Height: 5' 5.5" (1.664 m)   Weight: 300 lb (136.079 kg)   SpO2:  90%   Obese female in no acute distress Nose without purulence or discharge noted Neck without lymphadenopathy or thyromegaly Chest with mildly decreased breath sounds, no crackles or wheezes Cardiac exam with regular rate and rhythm Lower extremities with 1+ edema, no cyanosis Alert and oriented, moves all 4 extremities.      Assessment & Plan:  Solitary pulmonary nodule Most recent CT scan from 10/07/15 shows a small increase in size to 10 mm. I suspected based on the slow progression this is a highly differentiated adenocarcinoma. She would tolerate an intervention poorly but it may be relevant to consider biopsy or other intervention if it continues to enlarge. We will repeat her CT scan at 6 months and decide next steps  depending on its progression  COPD with emphysema  With documented desaturations on exertion  We will increase to 3L/min pulsed oxygen with all exertion. If you continue to have saturations below 88% then we will increase the oxygen further or we will change to continuous flow system.  Please continue your Symbicort and Spiriva as you have been  taking them.

## 2015-12-23 NOTE — Patient Instructions (Addendum)
We will increase to 3L/min pulsed oxygen with all exertion. If you continue to have saturations below 88% then we will increase the oxygen further or we will change to continuous flow system.  Please continue your Symbicort and Spiriva as you have been taking them We will repeat your CT scan of the chest in April without contrast to evaluate her pulmonary nodule. Follow with Dr. Lamonte Sakai in April after the CT to review

## 2015-12-23 NOTE — Assessment & Plan Note (Signed)
With documented desaturations on exertion  We will increase to 3L/min pulsed oxygen with all exertion. If you continue to have saturations below 88% then we will increase the oxygen further or we will change to continuous flow system.  Please continue your Symbicort and Spiriva as you have been taking them.

## 2015-12-23 NOTE — Telephone Encounter (Signed)
Pt returning call, she has an appointment this evening will talk with you then.Paige Hardin

## 2015-12-24 DIAGNOSIS — M6281 Muscle weakness (generalized): Secondary | ICD-10-CM | POA: Diagnosis not present

## 2015-12-24 DIAGNOSIS — R262 Difficulty in walking, not elsewhere classified: Secondary | ICD-10-CM | POA: Diagnosis not present

## 2015-12-24 DIAGNOSIS — J441 Chronic obstructive pulmonary disease with (acute) exacerbation: Secondary | ICD-10-CM | POA: Diagnosis not present

## 2015-12-24 DIAGNOSIS — R5383 Other fatigue: Secondary | ICD-10-CM | POA: Diagnosis not present

## 2015-12-24 DIAGNOSIS — R29898 Other symptoms and signs involving the musculoskeletal system: Secondary | ICD-10-CM | POA: Diagnosis not present

## 2015-12-27 DIAGNOSIS — M6281 Muscle weakness (generalized): Secondary | ICD-10-CM | POA: Diagnosis not present

## 2015-12-27 DIAGNOSIS — R29898 Other symptoms and signs involving the musculoskeletal system: Secondary | ICD-10-CM | POA: Diagnosis not present

## 2015-12-27 DIAGNOSIS — R5383 Other fatigue: Secondary | ICD-10-CM | POA: Diagnosis not present

## 2015-12-27 DIAGNOSIS — J441 Chronic obstructive pulmonary disease with (acute) exacerbation: Secondary | ICD-10-CM | POA: Diagnosis not present

## 2015-12-27 DIAGNOSIS — R262 Difficulty in walking, not elsewhere classified: Secondary | ICD-10-CM | POA: Diagnosis not present

## 2015-12-28 DIAGNOSIS — J441 Chronic obstructive pulmonary disease with (acute) exacerbation: Secondary | ICD-10-CM | POA: Diagnosis not present

## 2015-12-28 DIAGNOSIS — R262 Difficulty in walking, not elsewhere classified: Secondary | ICD-10-CM | POA: Diagnosis not present

## 2015-12-28 DIAGNOSIS — R5383 Other fatigue: Secondary | ICD-10-CM | POA: Diagnosis not present

## 2015-12-28 DIAGNOSIS — M6281 Muscle weakness (generalized): Secondary | ICD-10-CM | POA: Diagnosis not present

## 2015-12-28 DIAGNOSIS — R29898 Other symptoms and signs involving the musculoskeletal system: Secondary | ICD-10-CM | POA: Diagnosis not present

## 2015-12-29 DIAGNOSIS — J441 Chronic obstructive pulmonary disease with (acute) exacerbation: Secondary | ICD-10-CM | POA: Diagnosis not present

## 2015-12-29 DIAGNOSIS — R262 Difficulty in walking, not elsewhere classified: Secondary | ICD-10-CM | POA: Diagnosis not present

## 2015-12-29 DIAGNOSIS — R29898 Other symptoms and signs involving the musculoskeletal system: Secondary | ICD-10-CM | POA: Diagnosis not present

## 2015-12-29 DIAGNOSIS — M6281 Muscle weakness (generalized): Secondary | ICD-10-CM | POA: Diagnosis not present

## 2015-12-29 DIAGNOSIS — R5383 Other fatigue: Secondary | ICD-10-CM | POA: Diagnosis not present

## 2015-12-31 DIAGNOSIS — R262 Difficulty in walking, not elsewhere classified: Secondary | ICD-10-CM | POA: Diagnosis not present

## 2015-12-31 DIAGNOSIS — M6281 Muscle weakness (generalized): Secondary | ICD-10-CM | POA: Diagnosis not present

## 2015-12-31 DIAGNOSIS — R5383 Other fatigue: Secondary | ICD-10-CM | POA: Diagnosis not present

## 2015-12-31 DIAGNOSIS — R29898 Other symptoms and signs involving the musculoskeletal system: Secondary | ICD-10-CM | POA: Diagnosis not present

## 2015-12-31 DIAGNOSIS — J441 Chronic obstructive pulmonary disease with (acute) exacerbation: Secondary | ICD-10-CM | POA: Diagnosis not present

## 2016-01-03 DIAGNOSIS — M6281 Muscle weakness (generalized): Secondary | ICD-10-CM | POA: Diagnosis not present

## 2016-01-03 DIAGNOSIS — R5383 Other fatigue: Secondary | ICD-10-CM | POA: Diagnosis not present

## 2016-01-03 DIAGNOSIS — R262 Difficulty in walking, not elsewhere classified: Secondary | ICD-10-CM | POA: Diagnosis not present

## 2016-01-03 DIAGNOSIS — J441 Chronic obstructive pulmonary disease with (acute) exacerbation: Secondary | ICD-10-CM | POA: Diagnosis not present

## 2016-01-03 DIAGNOSIS — R29898 Other symptoms and signs involving the musculoskeletal system: Secondary | ICD-10-CM | POA: Diagnosis not present

## 2016-01-05 DIAGNOSIS — M6281 Muscle weakness (generalized): Secondary | ICD-10-CM | POA: Diagnosis not present

## 2016-01-05 DIAGNOSIS — R29898 Other symptoms and signs involving the musculoskeletal system: Secondary | ICD-10-CM | POA: Diagnosis not present

## 2016-01-05 DIAGNOSIS — R262 Difficulty in walking, not elsewhere classified: Secondary | ICD-10-CM | POA: Diagnosis not present

## 2016-01-05 DIAGNOSIS — R5383 Other fatigue: Secondary | ICD-10-CM | POA: Diagnosis not present

## 2016-01-05 DIAGNOSIS — J441 Chronic obstructive pulmonary disease with (acute) exacerbation: Secondary | ICD-10-CM | POA: Diagnosis not present

## 2016-01-07 DIAGNOSIS — J441 Chronic obstructive pulmonary disease with (acute) exacerbation: Secondary | ICD-10-CM | POA: Diagnosis not present

## 2016-01-07 DIAGNOSIS — M6281 Muscle weakness (generalized): Secondary | ICD-10-CM | POA: Diagnosis not present

## 2016-01-07 DIAGNOSIS — R262 Difficulty in walking, not elsewhere classified: Secondary | ICD-10-CM | POA: Diagnosis not present

## 2016-01-07 DIAGNOSIS — R5383 Other fatigue: Secondary | ICD-10-CM | POA: Diagnosis not present

## 2016-01-07 DIAGNOSIS — R29898 Other symptoms and signs involving the musculoskeletal system: Secondary | ICD-10-CM | POA: Diagnosis not present

## 2016-01-10 DIAGNOSIS — J441 Chronic obstructive pulmonary disease with (acute) exacerbation: Secondary | ICD-10-CM | POA: Diagnosis not present

## 2016-01-10 DIAGNOSIS — R262 Difficulty in walking, not elsewhere classified: Secondary | ICD-10-CM | POA: Diagnosis not present

## 2016-01-10 DIAGNOSIS — M6281 Muscle weakness (generalized): Secondary | ICD-10-CM | POA: Diagnosis not present

## 2016-01-10 DIAGNOSIS — R29898 Other symptoms and signs involving the musculoskeletal system: Secondary | ICD-10-CM | POA: Diagnosis not present

## 2016-01-10 DIAGNOSIS — R5383 Other fatigue: Secondary | ICD-10-CM | POA: Diagnosis not present

## 2016-01-11 DIAGNOSIS — E119 Type 2 diabetes mellitus without complications: Secondary | ICD-10-CM | POA: Diagnosis not present

## 2016-01-11 DIAGNOSIS — M6281 Muscle weakness (generalized): Secondary | ICD-10-CM | POA: Diagnosis not present

## 2016-01-11 DIAGNOSIS — R29898 Other symptoms and signs involving the musculoskeletal system: Secondary | ICD-10-CM | POA: Diagnosis not present

## 2016-01-11 DIAGNOSIS — B351 Tinea unguium: Secondary | ICD-10-CM | POA: Diagnosis not present

## 2016-01-11 DIAGNOSIS — R5383 Other fatigue: Secondary | ICD-10-CM | POA: Diagnosis not present

## 2016-01-11 DIAGNOSIS — L84 Corns and callosities: Secondary | ICD-10-CM | POA: Diagnosis not present

## 2016-01-11 DIAGNOSIS — R262 Difficulty in walking, not elsewhere classified: Secondary | ICD-10-CM | POA: Diagnosis not present

## 2016-01-11 DIAGNOSIS — J441 Chronic obstructive pulmonary disease with (acute) exacerbation: Secondary | ICD-10-CM | POA: Diagnosis not present

## 2016-01-12 DIAGNOSIS — R262 Difficulty in walking, not elsewhere classified: Secondary | ICD-10-CM | POA: Diagnosis not present

## 2016-01-12 DIAGNOSIS — R29898 Other symptoms and signs involving the musculoskeletal system: Secondary | ICD-10-CM | POA: Diagnosis not present

## 2016-01-12 DIAGNOSIS — J441 Chronic obstructive pulmonary disease with (acute) exacerbation: Secondary | ICD-10-CM | POA: Diagnosis not present

## 2016-01-12 DIAGNOSIS — R5383 Other fatigue: Secondary | ICD-10-CM | POA: Diagnosis not present

## 2016-01-12 DIAGNOSIS — M6281 Muscle weakness (generalized): Secondary | ICD-10-CM | POA: Diagnosis not present

## 2016-01-13 DIAGNOSIS — R5383 Other fatigue: Secondary | ICD-10-CM | POA: Diagnosis not present

## 2016-01-13 DIAGNOSIS — R29898 Other symptoms and signs involving the musculoskeletal system: Secondary | ICD-10-CM | POA: Diagnosis not present

## 2016-01-13 DIAGNOSIS — J441 Chronic obstructive pulmonary disease with (acute) exacerbation: Secondary | ICD-10-CM | POA: Diagnosis not present

## 2016-01-13 DIAGNOSIS — R262 Difficulty in walking, not elsewhere classified: Secondary | ICD-10-CM | POA: Diagnosis not present

## 2016-01-13 DIAGNOSIS — M6281 Muscle weakness (generalized): Secondary | ICD-10-CM | POA: Diagnosis not present

## 2016-01-14 DIAGNOSIS — M6281 Muscle weakness (generalized): Secondary | ICD-10-CM | POA: Diagnosis not present

## 2016-01-14 DIAGNOSIS — J441 Chronic obstructive pulmonary disease with (acute) exacerbation: Secondary | ICD-10-CM | POA: Diagnosis not present

## 2016-01-14 DIAGNOSIS — R5383 Other fatigue: Secondary | ICD-10-CM | POA: Diagnosis not present

## 2016-01-14 DIAGNOSIS — R29898 Other symptoms and signs involving the musculoskeletal system: Secondary | ICD-10-CM | POA: Diagnosis not present

## 2016-01-14 DIAGNOSIS — R262 Difficulty in walking, not elsewhere classified: Secondary | ICD-10-CM | POA: Diagnosis not present

## 2016-01-17 DIAGNOSIS — R29898 Other symptoms and signs involving the musculoskeletal system: Secondary | ICD-10-CM | POA: Diagnosis not present

## 2016-01-17 DIAGNOSIS — J441 Chronic obstructive pulmonary disease with (acute) exacerbation: Secondary | ICD-10-CM | POA: Diagnosis not present

## 2016-01-17 DIAGNOSIS — R262 Difficulty in walking, not elsewhere classified: Secondary | ICD-10-CM | POA: Diagnosis not present

## 2016-01-17 DIAGNOSIS — R5383 Other fatigue: Secondary | ICD-10-CM | POA: Diagnosis not present

## 2016-01-17 DIAGNOSIS — M6281 Muscle weakness (generalized): Secondary | ICD-10-CM | POA: Diagnosis not present

## 2016-01-19 DIAGNOSIS — R29898 Other symptoms and signs involving the musculoskeletal system: Secondary | ICD-10-CM | POA: Diagnosis not present

## 2016-01-19 DIAGNOSIS — R262 Difficulty in walking, not elsewhere classified: Secondary | ICD-10-CM | POA: Diagnosis not present

## 2016-01-19 DIAGNOSIS — M6281 Muscle weakness (generalized): Secondary | ICD-10-CM | POA: Diagnosis not present

## 2016-01-19 DIAGNOSIS — R5383 Other fatigue: Secondary | ICD-10-CM | POA: Diagnosis not present

## 2016-01-19 DIAGNOSIS — J441 Chronic obstructive pulmonary disease with (acute) exacerbation: Secondary | ICD-10-CM | POA: Diagnosis not present

## 2016-01-21 DIAGNOSIS — J441 Chronic obstructive pulmonary disease with (acute) exacerbation: Secondary | ICD-10-CM | POA: Diagnosis not present

## 2016-01-21 DIAGNOSIS — M6281 Muscle weakness (generalized): Secondary | ICD-10-CM | POA: Diagnosis not present

## 2016-01-21 DIAGNOSIS — R29898 Other symptoms and signs involving the musculoskeletal system: Secondary | ICD-10-CM | POA: Diagnosis not present

## 2016-01-21 DIAGNOSIS — R262 Difficulty in walking, not elsewhere classified: Secondary | ICD-10-CM | POA: Diagnosis not present

## 2016-01-21 DIAGNOSIS — R5383 Other fatigue: Secondary | ICD-10-CM | POA: Diagnosis not present

## 2016-01-24 DIAGNOSIS — R29898 Other symptoms and signs involving the musculoskeletal system: Secondary | ICD-10-CM | POA: Diagnosis not present

## 2016-01-24 DIAGNOSIS — R262 Difficulty in walking, not elsewhere classified: Secondary | ICD-10-CM | POA: Diagnosis not present

## 2016-01-24 DIAGNOSIS — M6281 Muscle weakness (generalized): Secondary | ICD-10-CM | POA: Diagnosis not present

## 2016-01-24 DIAGNOSIS — R5383 Other fatigue: Secondary | ICD-10-CM | POA: Diagnosis not present

## 2016-01-24 DIAGNOSIS — J441 Chronic obstructive pulmonary disease with (acute) exacerbation: Secondary | ICD-10-CM | POA: Diagnosis not present

## 2016-01-26 DIAGNOSIS — J441 Chronic obstructive pulmonary disease with (acute) exacerbation: Secondary | ICD-10-CM | POA: Diagnosis not present

## 2016-01-26 DIAGNOSIS — R262 Difficulty in walking, not elsewhere classified: Secondary | ICD-10-CM | POA: Diagnosis not present

## 2016-01-26 DIAGNOSIS — R5383 Other fatigue: Secondary | ICD-10-CM | POA: Diagnosis not present

## 2016-01-26 DIAGNOSIS — M6281 Muscle weakness (generalized): Secondary | ICD-10-CM | POA: Diagnosis not present

## 2016-01-26 DIAGNOSIS — R29898 Other symptoms and signs involving the musculoskeletal system: Secondary | ICD-10-CM | POA: Diagnosis not present

## 2016-01-28 DIAGNOSIS — R262 Difficulty in walking, not elsewhere classified: Secondary | ICD-10-CM | POA: Diagnosis not present

## 2016-01-28 DIAGNOSIS — J441 Chronic obstructive pulmonary disease with (acute) exacerbation: Secondary | ICD-10-CM | POA: Diagnosis not present

## 2016-01-28 DIAGNOSIS — R5383 Other fatigue: Secondary | ICD-10-CM | POA: Diagnosis not present

## 2016-01-28 DIAGNOSIS — M6281 Muscle weakness (generalized): Secondary | ICD-10-CM | POA: Diagnosis not present

## 2016-01-28 DIAGNOSIS — R29898 Other symptoms and signs involving the musculoskeletal system: Secondary | ICD-10-CM | POA: Diagnosis not present

## 2016-01-31 DIAGNOSIS — M6281 Muscle weakness (generalized): Secondary | ICD-10-CM | POA: Diagnosis not present

## 2016-01-31 DIAGNOSIS — R262 Difficulty in walking, not elsewhere classified: Secondary | ICD-10-CM | POA: Diagnosis not present

## 2016-01-31 DIAGNOSIS — R29898 Other symptoms and signs involving the musculoskeletal system: Secondary | ICD-10-CM | POA: Diagnosis not present

## 2016-01-31 DIAGNOSIS — R5383 Other fatigue: Secondary | ICD-10-CM | POA: Diagnosis not present

## 2016-01-31 DIAGNOSIS — J441 Chronic obstructive pulmonary disease with (acute) exacerbation: Secondary | ICD-10-CM | POA: Diagnosis not present

## 2016-02-02 DIAGNOSIS — R5383 Other fatigue: Secondary | ICD-10-CM | POA: Diagnosis not present

## 2016-02-02 DIAGNOSIS — R29898 Other symptoms and signs involving the musculoskeletal system: Secondary | ICD-10-CM | POA: Diagnosis not present

## 2016-02-02 DIAGNOSIS — M6281 Muscle weakness (generalized): Secondary | ICD-10-CM | POA: Diagnosis not present

## 2016-02-02 DIAGNOSIS — J441 Chronic obstructive pulmonary disease with (acute) exacerbation: Secondary | ICD-10-CM | POA: Diagnosis not present

## 2016-02-02 DIAGNOSIS — R262 Difficulty in walking, not elsewhere classified: Secondary | ICD-10-CM | POA: Diagnosis not present

## 2016-02-29 ENCOUNTER — Ambulatory Visit (INDEPENDENT_AMBULATORY_CARE_PROVIDER_SITE_OTHER)
Admission: RE | Admit: 2016-02-29 | Discharge: 2016-02-29 | Disposition: A | Discharge status: Home / Self Care | Payer: Medicare Other | Source: Ambulatory Visit | Attending: Emergency Medicine | Admitting: Emergency Medicine

## 2016-02-29 DIAGNOSIS — R911 Solitary pulmonary nodule: Secondary | ICD-10-CM | POA: Diagnosis not present

## 2016-03-02 ENCOUNTER — Encounter: Payer: Self-pay | Admitting: Emergency Medicine

## 2016-03-02 ENCOUNTER — Ambulatory Visit (INDEPENDENT_AMBULATORY_CARE_PROVIDER_SITE_OTHER): Payer: Medicare Other | Admitting: Emergency Medicine

## 2016-03-02 VITALS — BP 128/86 | HR 65 | Ht 65.0 in | Wt 303.0 lb

## 2016-03-02 DIAGNOSIS — R911 Solitary pulmonary nodule: Secondary | ICD-10-CM | POA: Diagnosis not present

## 2016-03-02 DIAGNOSIS — J438 Other emphysema: Secondary | ICD-10-CM | POA: Diagnosis not present

## 2016-03-02 NOTE — Assessment & Plan Note (Signed)
Round solid nodule in the RLL, slowly enlarging. I suspect that this is a malignancy, told her so today. She understands her chronic health issues, is hesitant to undergo biopsy. She might be willing to to do XRT in the future, definitely would not want surgery. We have agreed to follow this in 6 months despite the current suspicion, revisit the options at that time.

## 2016-03-02 NOTE — Assessment & Plan Note (Signed)
Slight progression in symptoms. She has been using continuous oxygen with exertion which seems to help her compared with pulse oxygen which she was using in the past. Currently on 2 L/m. We will continue her Spiriva and Symbicort.

## 2016-03-02 NOTE — Progress Notes (Signed)
Subjective:    Patient ID: Paige Hardin, female    DOB: Feb 10, 1946, 70 y.o.   MRN: [id number]  HPI OFFice Note [facility] 10/2014 The patient comes in today for follow-up of her known COPD with chronic respiratory failure. She also has known chronic congestive heart failure, and is monitoring her fluid balance very carefully. She has been compliant on her bronchodilator regimen and oxygen, and feels that she is near her usual baseline. She has not had an acute exacerbation or pulmonary infection according to her history since the last visit. She has been trying to exercise more regularly, and is even walking around her building.  [name] 05/18/15 -- Follow up visit for pt followed by Dr Gwenette Greet for COPD, chronic resp failure. She has had some congestion and hoarseness this spring. She is having dry cough, is not currently on allergy. She is stable on spiriva and symbicort, has some associated hoarseness. She goes to CHF clinic, has been adjusting diuretics. She believes that her breathing has been good, denies wheeze, has been on 2L/min. Uses SABA very rarely.   [name] 12/23/15 -- follow-up visit for COPD and associated chronic hypoxemic respiratory failure. She also has allergic rhinitis with associated hoarseness and occasional cough.  She tells me that she was admitted to [facility] in 12/16 for an AE COPD, sinusitis, UTI.  She underwent chest CT 10/07/15 that I have reviewed personally, shows a small increase in size to 36mm. Needs a repeat CT in 6 months. No family hx lung CA. She reports that her breathing has been difficult with exertion. She has hypoxemia with exertion that has been documented at [employer]. She is usually on 2L/min pulsed at all times. ? Whether she is triggering the pulse?  She is on symbicort and spiriva. Still coughing some. Rare albuterol.   [name] 03/02/16 -- patient with a history of COPD and chronic hypoxemic respite for failure, allergic rhinitis and associated cough. His right lower lobe  pulmonary nodule that was identified in 2015 and which we have followed with serial CT scans of the chest. Her most recent scan was on 02/29/16 that I personally reviewed.  She tells me that she was hosp for AE-COPD and possible PNA in 12/16. She notes more exertional SOB, sees desaturations if she uses pulsed O2 when she walks > better if on continuous.    Review of Systems  Constitutional: Negative for fever and unexpected weight change.  HENT: Positive for congestion and postnasal drip. Negative for dental problem, ear pain, nosebleeds, rhinorrhea, sinus pressure, sneezing, sore throat and trouble swallowing.   Eyes: Negative for redness and itching.  Respiratory: Positive for cough and shortness of breath. Negative for chest tightness and wheezing.   Cardiovascular: Negative for palpitations and leg swelling.  Gastrointestinal: Negative for nausea and vomiting.  Genitourinary: Negative for dysuria.  Musculoskeletal: Negative for joint swelling.  Skin: Negative for rash.  Neurological: Negative for headaches.  Hematological: Does not bruise/bleed easily.  Psychiatric/Behavioral: Negative for dysphoric mood. The patient is not nervous/anxious.        Objective:   Physical Exam  Filed Vitals:   03/02/16 1415  BP: 128/86  Pulse: 65  Height: 5\' 5"  (1.651 m)  Weight: 303 lb (137.44 kg)  SpO2: 90%   Obese female in no acute distress Nose without purulence or discharge noted Neck without lymphadenopathy or thyromegaly Chest with mildly decreased breath sounds, no crackles or wheezes Cardiac exam with regular rate and rhythm Lower extremities with 1+ edema,  no cyanosis Alert and oriented, moves all 4 extremities.      Assessment & Plan:  Solitary pulmonary nodule Round solid nodule in the RLL, slowly enlarging. I suspect that this is a malignancy, told her so today. She understands her chronic health issues, is hesitant to undergo biopsy. She might be willing to to do XRT in the  future, definitely would not want surgery. We have agreed to follow this in 6 months despite the current suspicion, revisit the options at that time.   COPD with emphysema  Slight progression in symptoms. She has been using continuous oxygen with exertion which seems to help her compared with pulse oxygen which she was using in the past. Currently on 2 L/m. We will continue her Spiriva and Symbicort.

## 2016-03-02 NOTE — Patient Instructions (Signed)
Please continue your inhaled medicines and your oxygen as you have been taking them We will repeat your CT scan of the chest in 6 months to look for interval change. We will follow-up in 6 months to review the results of her scan and decide our next steps.

## 2016-03-07 ENCOUNTER — Telehealth: Payer: Self-pay | Admitting: Emergency Medicine

## 2016-03-07 NOTE — Telephone Encounter (Signed)
Called and spoke with Paige Hardin at [place]. She states that a super D was burned and she wanted to make sure it was not needed before it was destroyed. I explained to her that RB and Mendel Ryder were out of the office at this time. She states that she will go ahead and send the CD over to [place] that way we have it in case it is needed since a Super D order was placed. Nothing further needed.

## 2016-03-21 ENCOUNTER — Other Ambulatory Visit (HOSPITAL_BASED_OUTPATIENT_CLINIC_OR_DEPARTMENT_OTHER): Payer: Medicare Other

## 2016-03-21 ENCOUNTER — Encounter: Payer: Self-pay | Admitting: Hematology & Oncology

## 2016-03-21 ENCOUNTER — Ambulatory Visit (HOSPITAL_BASED_OUTPATIENT_CLINIC_OR_DEPARTMENT_OTHER): Payer: Medicare Other | Admitting: Hematology & Oncology

## 2016-03-21 ENCOUNTER — Ambulatory Visit: Payer: Medicare Other

## 2016-03-21 VITALS — BP 131/55 | HR 64 | Temp 98.4°F | Resp 18 | Ht 65.0 in | Wt 303.0 lb

## 2016-03-21 DIAGNOSIS — D509 Iron deficiency anemia, unspecified: Secondary | ICD-10-CM

## 2016-03-21 DIAGNOSIS — R911 Solitary pulmonary nodule: Secondary | ICD-10-CM

## 2016-03-21 DIAGNOSIS — N289 Disorder of kidney and ureter, unspecified: Secondary | ICD-10-CM

## 2016-03-21 DIAGNOSIS — E1165 Type 2 diabetes mellitus with hyperglycemia: Secondary | ICD-10-CM

## 2016-03-21 DIAGNOSIS — D649 Anemia, unspecified: Secondary | ICD-10-CM

## 2016-03-21 DIAGNOSIS — Z7901 Long term (current) use of anticoagulants: Secondary | ICD-10-CM | POA: Diagnosis not present

## 2016-03-21 LAB — CBC WITH DIFFERENTIAL (CANCER CENTER ONLY)
BASO#: 0 10*3/uL (ref 0.0–0.2)
BASO%: 0.3 % (ref 0.0–2.0)
EOS ABS: 0.2 10*3/uL (ref 0.0–0.5)
EOS%: 1.6 % (ref 0.0–7.0)
HEMATOCRIT: 35.8 % (ref 34.8–46.6)
HEMOGLOBIN: 11.6 g/dL (ref 11.6–15.9)
LYMPH#: 1 10*3/uL (ref 0.9–3.3)
LYMPH%: 9.9 % — ABNORMAL LOW (ref 14.0–48.0)
MCH: 30.6 pg (ref 26.0–34.0)
MCHC: 32.4 g/dL (ref 32.0–36.0)
MCV: 95 fL (ref 81–101)
MONO#: 0.8 10*3/uL (ref 0.1–0.9)
MONO%: 8.2 % (ref 0.0–13.0)
NEUT#: 8.1 10*3/uL — ABNORMAL HIGH (ref 1.5–6.5)
NEUT%: 80 % (ref 39.6–80.0)
Platelets: 271 10*3/uL (ref 145–400)
RBC: 3.79 10*6/uL (ref 3.70–5.32)
RDW: 15.7 % (ref 11.1–15.7)
WBC: 10.1 10*3/uL — ABNORMAL HIGH (ref 3.9–10.0)

## 2016-03-21 LAB — COMPREHENSIVE METABOLIC PANEL
ALBUMIN: 3.4 g/dL — AB (ref 3.5–5.0)
ALK PHOS: 78 U/L (ref 40–150)
ALT: 18 U/L (ref 0–55)
AST: 15 U/L (ref 5–34)
Anion Gap: 10 mEq/L (ref 3–11)
BUN: 34.6 mg/dL — ABNORMAL HIGH (ref 7.0–26.0)
CALCIUM: 9.6 mg/dL (ref 8.4–10.4)
CHLORIDE: 98 meq/L (ref 98–109)
CO2: 29 mEq/L (ref 22–29)
CREATININE: 1.5 mg/dL — AB (ref 0.6–1.1)
EGFR: 35 mL/min/{1.73_m2} — ABNORMAL LOW (ref >90)
Glucose: 192 mg/dl — ABNORMAL HIGH (ref 70–140)
POTASSIUM: 4.2 meq/L (ref 3.5–5.1)
Sodium: 137 mEq/L (ref 136–145)
Total Bilirubin: 0.53 mg/dL (ref 0.20–1.20)
Total Protein: 7.1 g/dL (ref 6.4–8.3)

## 2016-03-21 NOTE — Progress Notes (Signed)
Hematology and Oncology Follow Up Visit  Paige Hardin [id number] 1946/07/13 70 y.o. 03/21/2016   Principle Diagnosis:  Anemia secondary to iron deficiency  Anemia secondary to renal insufficiency  Chronic anticoagulation for paroxysmal atrial fibrillation  Current Therapy:   IV iron as indicated  Aranesp 300 mcg subcutaneous as needed for hemoglobin less than 11  ELIQUIS-long-term    Interim History: [name] is here today for follow-up. She seems be doing okay. She apparently was hospitalized in December. She had a urinary tract infection. I think she had cardiac issues. She said that she still is trying over this.  We have not given her iron probably for almost 2 years. When we last saw her back in October last year, her ferritin was 185 with iron saturation of 23%.  She is on oxygen. She does have the paroxysmal atrial fibrillation.  She did have a CT scan of the chest recently. This was done so as a right lower lobe nodule can be followed. The lung nodule was minimally increased in size. It measured 1 x 1.1 cm. She wishes just to follow this along.  She's had no problems with the ELIQUIS. There's been no bleeding. Her she's had no obvious changes in bowel or bladder habits.  She's had no rashes.  Overall, her performance status is ECOG 2.    Medications:    Medication List       This list is accurate as of: 03/21/16 12:44 PM.  Always use your most recent med list.               acetaminophen 325 MG tablet  Commonly known as:  TYLENOL  Take 325 mg by mouth daily as needed for mild pain.     allopurinol 100 MG tablet  Commonly known as:  ZYLOPRIM  Take 1 tablet (100 mg total) by mouth daily.     amiodarone 200 MG tablet  Commonly known as:  PACERONE  Take 1 tablet (200 mg total) by mouth daily.     apixaban 5 MG Tabs tablet  Commonly known as:  ELIQUIS  Take 1 tablet (5 mg total) by mouth 2 (two) times daily.     benzonatate 200 MG capsule  Commonly  known as:  TESSALON  Take 1 capsule (200 mg total) by mouth 2 (two) times daily as needed for cough.     budesonide-formoterol 160-4.5 MCG/ACT inhaler  Commonly known as:  SYMBICORT  Inhale 2 puffs into the lungs 2 (two) times daily.     calcium-vitamin D 500-200 MG-UNIT tablet  Commonly known as:  OSCAL WITH D  Take 1 tablet by mouth daily with breakfast.     DIABETIC TUSSIN EX 100 MG/5ML liquid  Generic drug:  guaiFENesin  Take 200 mg by mouth 3 (three) times daily as needed for cough.     diltiazem 180 MG 24 hr capsule  Commonly known as:  CARDIZEM CD  Take 1 capsule (180 mg total) by mouth daily.     ezetimibe-simvastatin 10-40 MG tablet  Commonly known as:  VYTORIN  Take 1 tablet by mouth at bedtime.     ferrous sulfate 325 (65 FE) MG tablet  Take 325 mg by mouth 2 (two) times daily with a meal.     furosemide 80 MG tablet  Commonly known as:  LASIX  Take 2 tablets (160 mg total) by mouth 2 (two) times daily. MAKE SURE TO TAKE AT 8 AM AND 2 PM     glimepiride  2 MG tablet  Commonly known as:  AMARYL  Take 2 mg by mouth daily with breakfast.     insulin aspart 100 UNIT/ML injection  Commonly known as:  novoLOG  Inject 5 Units into the skin 3 (three) times daily with meals.     insulin aspart 100 UNIT/ML injection  Commonly known as:  novoLOG  Inject 0-9 Units into the skin 3 (three) times daily with meals. Correction factor Sliding scale CBG 70 - 120: 0 units CBG 121 - 150: 1 unit,  CBG 151 - 200: 2 units,  CBG 201 - 250: 3 units,  CBG 251 - 300: 5 units,  CBG 301 - 350: 7 units,  CBG 351 - 400: 9 units   CBG > 400: 9 units and notify your MD     metolazone 2.5 MG tablet  Commonly known as:  ZAROXOLYN  Take 1 tablet only if weight is greater or equal to 300 lbs as needed     metoprolol 50 MG tablet  Commonly known as:  LOPRESSOR  Take 1 tablet (50 mg total) by mouth 2 (two) times daily.     MULTIVITAMIN/IRON PO  Take 1 tablet by mouth daily.     omeprazole 20  MG capsule  Commonly known as:  PRILOSEC  Take 20 mg by mouth daily.     potassium chloride SA 20 MEQ tablet  Commonly known as:  K-DUR,KLOR-CON  Take 20 mEq by mouth 3 (three) times daily. MAKE SURE TO TAKE EXTRA 20 MEQ ON METOLAZONE DAYS     albuterol (2.5 MG/3ML) 0.083% nebulizer solution  Commonly known as:  PROVENTIL  Take 2.5 mg by nebulization every 6 (six) hours as needed for wheezing or shortness of breath.     PROAIR HFA 108 (90 Base) MCG/ACT inhaler  Generic drug:  albuterol  Inhale 2 puffs into the lungs every 6 (six) hours as needed for wheezing.     sitaGLIPtin 100 MG tablet  Commonly known as:  JANUVIA  Take 100 mg by mouth daily.     tiotropium 18 MCG inhalation capsule  Commonly known as:  SPIRIVA  Place 1 capsule (18 mcg total) into inhaler and inhale daily.     vitamin B-12 500 MCG tablet  Commonly known as:  CYANOCOBALAMIN  Take 500 mcg by mouth daily.     Vitamin C 500 MG Caps  Take 500 mg by mouth daily.     Vitamin D 2000 units tablet  Take 2,000 Units by mouth daily.        Allergies:  Allergies  Allergen Reactions  . Ace Inhibitors Cough  . Clindamycin Rash    Past Medical History, Surgical history, Social history, and Family History were reviewed and updated.  Review of Systems: All other 10 point review of systems is negative.   Physical Exam:  height is 5\' 5"  (1.651 m) and weight is 303 lb (137.44 kg). Her oral temperature is 98.4 F (36.9 C). Her blood pressure is 131/55 and her pulse is 64. Her respiration is 18.   Wt Readings from Last 3 Encounters:  03/21/16 303 lb (137.44 kg)  03/02/16 303 lb (137.44 kg)  12/23/15 300 lb (136.079 kg)    Ocular: Sclerae unicteric, pupils equal, round and reactive to light Ear-nose-throat: Oropharynx clear, dentition fair Lymphatic: No cervical or supraclavicular adenopathy Lungs no rales or rhonchi, good excursion bilaterally Heart regular rate and rhythm, no murmur appreciated Abd soft,  nontender, positive bowel sounds MSK no focal spinal  tenderness, no joint edema Neuro: non-focal, well-oriented, appropriate affect Breasts: Deferred  Lab Results  Component Value Date   WBC 10.1* 03/21/2016   HGB 11.6 03/21/2016   HCT 35.8 03/21/2016   MCV 95 03/21/2016   PLT 271 03/21/2016   Lab Results  Component Value Date   FERRITIN 185 09/21/2015   IRON 61 09/21/2015   TIBC 259 09/21/2015   UIBC 198 09/21/2015   IRONPCTSAT 23 09/21/2015   Lab Results  Component Value Date   RETICCTPCT 2.3 09/21/2015   RBC 3.79 03/21/2016   RETICCTABS 89.0 09/21/2015   No results found for: KPAFRELGTCHN, LAMBDASER, KAPLAMBRATIO No results found for: Kandis Cocking, IGMSERUM No results found for: Kathrynn Ducking, MSPIKE, SPEI   Chemistry      Component Value Date/Time   NA 135 11/19/2015 0437   NA 138 09/21/2015 1103   NA 139 12/22/2014 1019   K 4.4 11/19/2015 0437   K 4.0 09/21/2015 1103   K 4.2 12/22/2014 1019   CL 91* 11/19/2015 0437   CL 96* 12/22/2014 1019   CO2 30 11/19/2015 0437   CO2 30* 09/21/2015 1103   CO2 31 12/22/2014 1019   BUN 41* 11/19/2015 0437   BUN 32.5* 09/21/2015 1103   BUN 32* 12/22/2014 1019   CREATININE 1.58* 11/19/2015 0437   CREATININE 1.5* 09/21/2015 1103   CREATININE 1.5* 12/22/2014 1019      Component Value Date/Time   CALCIUM 9.4 11/19/2015 0437   CALCIUM 9.7 09/21/2015 1103   CALCIUM 9.4 12/22/2014 1019   ALKPHOS 98 09/21/2015 1103   ALKPHOS 83 09/02/2015 1218   ALKPHOS 76 12/22/2014 1019   AST 14 09/21/2015 1103   AST 24 09/02/2015 1218   AST 19 12/22/2014 1019   ALT 19 09/21/2015 1103   ALT 23 09/02/2015 1218   ALT 18 12/22/2014 1019   BILITOT 0.31 09/21/2015 1103   BILITOT 0.5 09/02/2015 1218   BILITOT 0.60 12/22/2014 1019     Impression and Plan: Ms. Muneton is 70 year old [place] female with multifactorial anemia (iron deficiency/renal insufficiency). This has not been a problem for her  for quite some time.  Even though she really is not that anemic, her iron studies might be going down. Her MCV is a little bit lower.  We will have to see what her iron studies show. It is possible she may need some iron.  Otherwise, as far as the right lower lobe lung nodule is concerning, I totally agree with her about following for right now. I think if this does a little bit larger, she would be a great candidate for stereotactic radiosurgery. Given her other comorbid conditions, I think radiosurgery would be a great idea. I would not think that she would even need a biopsy.    We will plan to get her back to see [place] in another 4 months.     Volanda Napoleon, MD 4/25/201712:44 PM

## 2016-03-21 NOTE — Progress Notes (Signed)
Aranesp not needed.

## 2016-03-22 LAB — IRON AND TIBC
%SAT: 23 % (ref 21–57)
Iron: 63 ug/dL (ref 41–142)
TIBC: 270 ug/dL (ref 236–444)
UIBC: 207 ug/dL (ref 120–384)

## 2016-03-22 LAB — RETICULOCYTES: Reticulocyte Count: 2.3 % (ref 0.6–2.6)

## 2016-03-22 LAB — FERRITIN: Ferritin: 180 ng/ml (ref 9–269)

## 2016-04-26 DIAGNOSIS — B351 Tinea unguium: Secondary | ICD-10-CM | POA: Diagnosis not present

## 2016-04-27 DIAGNOSIS — R609 Edema, unspecified: Secondary | ICD-10-CM | POA: Diagnosis not present

## 2016-04-27 DIAGNOSIS — Z794 Long term (current) use of insulin: Secondary | ICD-10-CM | POA: Diagnosis not present

## 2016-04-27 DIAGNOSIS — E1165 Type 2 diabetes mellitus with hyperglycemia: Secondary | ICD-10-CM | POA: Diagnosis not present

## 2016-06-15 DIAGNOSIS — E1165 Type 2 diabetes mellitus with hyperglycemia: Secondary | ICD-10-CM | POA: Diagnosis not present

## 2016-06-15 DIAGNOSIS — R609 Edema, unspecified: Secondary | ICD-10-CM | POA: Diagnosis not present

## 2016-06-15 DIAGNOSIS — Z794 Long term (current) use of insulin: Secondary | ICD-10-CM | POA: Diagnosis not present

## 2016-07-25 ENCOUNTER — Other Ambulatory Visit (HOSPITAL_BASED_OUTPATIENT_CLINIC_OR_DEPARTMENT_OTHER): Payer: Medicare Other

## 2016-07-25 ENCOUNTER — Ambulatory Visit (HOSPITAL_BASED_OUTPATIENT_CLINIC_OR_DEPARTMENT_OTHER): Payer: Medicare Other | Admitting: Family

## 2016-07-25 VITALS — BP 122/93 | HR 99 | Temp 97.6°F | Wt 306.0 lb

## 2016-07-25 DIAGNOSIS — D509 Iron deficiency anemia, unspecified: Secondary | ICD-10-CM | POA: Diagnosis not present

## 2016-07-25 DIAGNOSIS — Z7901 Long term (current) use of anticoagulants: Secondary | ICD-10-CM

## 2016-07-25 DIAGNOSIS — N183 Chronic kidney disease, stage 3 unspecified: Secondary | ICD-10-CM

## 2016-07-25 DIAGNOSIS — D631 Anemia in chronic kidney disease: Secondary | ICD-10-CM | POA: Diagnosis not present

## 2016-07-25 DIAGNOSIS — E1165 Type 2 diabetes mellitus with hyperglycemia: Secondary | ICD-10-CM

## 2016-07-25 LAB — IRON AND TIBC
%SAT: 21 % (ref 21–57)
Iron: 59 ug/dL (ref 41–142)
TIBC: 285 ug/dL (ref 236–444)
UIBC: 226 ug/dL (ref 120–384)

## 2016-07-25 LAB — CBC WITH DIFFERENTIAL (CANCER CENTER ONLY)
BASO#: 0 10*3/uL (ref 0.0–0.2)
BASO%: 0.2 % (ref 0.0–2.0)
EOS ABS: 0.2 10*3/uL (ref 0.0–0.5)
EOS%: 1.7 % (ref 0.0–7.0)
HEMATOCRIT: 37.8 % (ref 34.8–46.6)
HGB: 12.2 g/dL (ref 11.6–15.9)
LYMPH#: 0.9 10*3/uL (ref 0.9–3.3)
LYMPH%: 9.8 % — ABNORMAL LOW (ref 14.0–48.0)
MCH: 30.2 pg (ref 26.0–34.0)
MCHC: 32.3 g/dL (ref 32.0–36.0)
MCV: 94 fL (ref 81–101)
MONO#: 0.8 10*3/uL (ref 0.1–0.9)
MONO%: 8.3 % (ref 0.0–13.0)
NEUT#: 7.5 10*3/uL — ABNORMAL HIGH (ref 1.5–6.5)
NEUT%: 80 % (ref 39.6–80.0)
Platelets: 284 10*3/uL (ref 145–400)
RBC: 4.04 10*6/uL (ref 3.70–5.32)
RDW: 15.6 % (ref 11.1–15.7)
WBC: 9.4 10*3/uL (ref 3.9–10.0)

## 2016-07-25 LAB — COMPREHENSIVE METABOLIC PANEL
ALT: 16 U/L (ref 0–55)
AST: 15 U/L (ref 5–34)
Albumin: 3.3 g/dL — ABNORMAL LOW (ref 3.5–5.0)
Alkaline Phosphatase: 81 U/L (ref 40–150)
Anion Gap: 13 mEq/L — ABNORMAL HIGH (ref 3–11)
BUN: 47.5 mg/dL — ABNORMAL HIGH (ref 7.0–26.0)
CO2: 30 mEq/L — ABNORMAL HIGH (ref 22–29)
Calcium: 9.9 mg/dL (ref 8.4–10.4)
Chloride: 97 mEq/L — ABNORMAL LOW (ref 98–109)
Creatinine: 1.7 mg/dL — ABNORMAL HIGH (ref 0.6–1.1)
EGFR: 29 mL/min/{1.73_m2} — ABNORMAL LOW (ref >90)
Glucose: 180 mg/dl — ABNORMAL HIGH (ref 70–140)
Potassium: 3.9 mEq/L (ref 3.5–5.1)
Sodium: 140 mEq/L (ref 136–145)
Total Bilirubin: 0.37 mg/dL (ref 0.20–1.20)
Total Protein: 7.3 g/dL (ref 6.4–8.3)

## 2016-07-25 LAB — FERRITIN: Ferritin: 150 ng/ml (ref 9–269)

## 2016-07-25 NOTE — Progress Notes (Signed)
Hematology and Oncology Follow Up Visit  Paige Hardin [id number] [birth year] 70 y.o. 07/25/2016   Principle Diagnosis:  Anemia secondary to iron deficiency  Anemia secondary to renal insufficiency  Chronic anticoagulation for paroxysmal atrial fibrillation  Current Therapy:   IV iron as indicated - last received in June 2015 Aranesp 300 mcg subcutaneous as needed for hemoglobin less than 11  ELIQUIS-long-term    Interim History: Paige Hardin is here today for a follow-up. She is doing quite well but does experience some fatigued after taking her diuretic. She has CHF and is closely monitored from fluid overload.  She has SOB with exertion and is on 2L Paige Hardin supplemental O2 24 hours a day.  She last received iron in June 2015. In April, her ferritin was 180 with an iron saturation of 23%.   No fever, chills, n/v, cough, rash, headaches, dizziness, chest pain, palpitations, abdominal pain or changes in her bowel or bladder habits.  She has done well on Eliquis for her atrial fib and has had no episodes of bleeding or petechiae. She has noticed that she bruises easily.  In may she had an irregular mass of the right breast biopsied. The pathology was negative for malignancy.   Medications:    Medication List       Accurate as of 07/25/16 11:17 AM. Always use your most recent med list.          acetaminophen 325 MG tablet Commonly known as:  TYLENOL Take 325 mg by mouth daily as needed for mild pain.   allopurinol 100 MG tablet Commonly known as:  ZYLOPRIM Take 1 tablet (100 mg total) by mouth daily.   amiodarone 200 MG tablet Commonly known as:  PACERONE Take 1 tablet (200 mg total) by mouth daily.   apixaban 5 MG Tabs tablet Commonly known as:  ELIQUIS Take 1 tablet (5 mg total) by mouth 2 (two) times daily.   benzonatate 200 MG capsule Commonly known as:  TESSALON Take 1 capsule (200 mg total) by mouth 2 (two) times daily as needed for cough.   budesonide-formoterol  160-4.5 MCG/ACT inhaler Commonly known as:  SYMBICORT Inhale 2 puffs into the lungs 2 (two) times daily.   calcium-vitamin D 500-200 MG-UNIT tablet Commonly known as:  OSCAL WITH D Take 1 tablet by mouth daily with breakfast.   DIABETIC TUSSIN EX 100 MG/5ML liquid Generic drug:  guaiFENesin Take 200 mg by mouth 3 (three) times daily as needed for cough.   diltiazem 180 MG 24 hr capsule Commonly known as:  CARDIZEM CD Take 1 capsule (180 mg total) by mouth daily.   ezetimibe-simvastatin 10-40 MG tablet Commonly known as:  VYTORIN Take 1 tablet by mouth at bedtime.   ferrous sulfate 325 (65 FE) MG tablet Take 325 mg by mouth 2 (two) times daily with a meal.   furosemide 80 MG tablet Commonly known as:  LASIX Take 2 tablets (160 mg total) by mouth 2 (two) times daily. MAKE SURE TO TAKE AT 8 AM AND 2 PM   glimepiride 2 MG tablet Commonly known as:  AMARYL Take 2 mg by mouth daily with breakfast.   insulin aspart 100 UNIT/ML injection Commonly known as:  novoLOG Inject 5 Units into the skin 3 (three) times daily with meals.   insulin aspart 100 UNIT/ML injection Commonly known as:  novoLOG Inject 0-9 Units into the skin 3 (three) times daily with meals. Correction factor Sliding scale CBG 70 - 120: 0 units CBG 121 -  150: 1 unit,  CBG 151 - 200: 2 units,  CBG 201 - 250: 3 units,  CBG 251 - 300: 5 units,  CBG 301 - 350: 7 units,  CBG 351 - 400: 9 units   CBG > 400: 9 units and notify your MD   metolazone 2.5 MG tablet Commonly known as:  ZAROXOLYN Take 1 tablet only if weight is greater or equal to 300 lbs as needed   metoprolol 50 MG tablet Commonly known as:  LOPRESSOR Take 1 tablet (50 mg total) by mouth 2 (two) times daily.   MULTIVITAMIN/IRON PO Take 1 tablet by mouth daily.   omeprazole 20 MG capsule Commonly known as:  PRILOSEC Take 20 mg by mouth daily.   potassium chloride SA 20 MEQ tablet Commonly known as:  K-DUR,KLOR-CON Take 20 mEq by mouth 3 (three)  times daily. MAKE SURE TO TAKE EXTRA 20 MEQ ON METOLAZONE DAYS   albuterol (2.5 MG/3ML) 0.083% nebulizer solution Commonly known as:  PROVENTIL Take 2.5 mg by nebulization every 6 (six) hours as needed for wheezing or shortness of breath.   PROAIR HFA 108 (90 Base) MCG/ACT inhaler Generic drug:  albuterol Inhale 2 puffs into the lungs every 6 (six) hours as needed for wheezing.   sitaGLIPtin 100 MG tablet Commonly known as:  JANUVIA Take 100 mg by mouth daily.   tiotropium 18 MCG inhalation capsule Commonly known as:  SPIRIVA Place 1 capsule (18 mcg total) into inhaler and inhale daily.   vitamin B-12 500 MCG tablet Commonly known as:  CYANOCOBALAMIN Take 500 mcg by mouth daily.   Vitamin C 500 MG Caps Take 500 mg by mouth daily.   Vitamin D 2000 units tablet Take 2,000 Units by mouth daily.       Allergies:  Allergies  Allergen Reactions  . Ace Inhibitors Cough  . Clindamycin Rash    Past Medical History, Surgical history, Social history, and Family History were reviewed and updated.  Review of Systems: All other 10 point review of systems is negative.   Physical Exam:  vitals were not taken for this visit.  Wt Readings from Last 3 Encounters:  03/21/16 (!) 303 lb (137.4 kg)  03/02/16 (!) 303 lb (137.4 kg)  12/23/15 300 lb (136.1 kg)    Ocular: Sclerae unicteric, pupils equal, round and reactive to light Ear-nose-throat: Oropharynx clear, dentition fair Lymphatic: No cervical supraclavicular or axillary adenopathy Lungs no rales or rhonchi, good excursion bilaterally Heart regular rate and rhythm, no murmur appreciated Abd soft, nontender, positive bowel sounds, no liver or spleen tip palpated on exam MSK no focal spinal tenderness, no joint edema Neuro: non-focal, well-oriented, appropriate affect Breasts: Deferred  Lab Results  Component Value Date   WBC 10.1 (H) 03/21/2016   HGB 11.6 03/21/2016   HCT 35.8 03/21/2016   MCV 95 03/21/2016   PLT 271  03/21/2016   Lab Results  Component Value Date   FERRITIN 180 03/21/2016   IRON 63 03/21/2016   TIBC 270 03/21/2016   UIBC 207 03/21/2016   IRONPCTSAT 23 03/21/2016   Lab Results  Component Value Date   RETICCTPCT 2.3 09/21/2015   RBC 3.79 03/21/2016   RETICCTABS 89.0 09/21/2015   No results found for: KPAFRELGTCHN, LAMBDASER, KAPLAMBRATIO No results found for: IGGSERUM, IGA, IGMSERUM No results found for: Ronnald Ramp, A1GS, A2GS, BETS, BETA2SER, GAMS, MSPIKE, SPEI   Chemistry      Component Value Date/Time   NA 137 03/21/2016 1151   K 4.2 03/21/2016  1151   CL 91 (L) 11/19/2015 0437   CL 96 (L) 12/22/2014 1019   CO2 29 03/21/2016 1151   BUN 34.6 (H) 03/21/2016 1151   CREATININE 1.5 (H) 03/21/2016 1151      Component Value Date/Time   CALCIUM 9.6 03/21/2016 1151   ALKPHOS 78 03/21/2016 1151   AST 15 03/21/2016 1151   ALT 18 03/21/2016 1151   BILITOT 0.53 03/21/2016 1151     Impression and Plan: Ms. Swinderman is 70 year old [place] female with multifactorial anemia (iron deficiency/renal insufficiency). She responded nicely to [facility] and has no required an infusion in over 2 years. She has some occasional fatigue with her diuretic and continues to have issues with CHF.  Her Hgb is stable at 12.2 with an MCV of 94. We will see what her iron studies show and plan to bring her back in for an infusion if needed.  We will go ahead and plan to see her back in 4 months for repeat lab work and follow-up.  She will contact our office with any questions or concerns. We can certainly see her sooner if need be.   Eliezer Bottom, NP 8/29/201711:17 AM

## 2016-08-30 DIAGNOSIS — B351 Tinea unguium: Secondary | ICD-10-CM | POA: Diagnosis not present

## 2016-08-30 DIAGNOSIS — E119 Type 2 diabetes mellitus without complications: Secondary | ICD-10-CM | POA: Diagnosis not present

## 2016-09-12 ENCOUNTER — Ambulatory Visit (INDEPENDENT_AMBULATORY_CARE_PROVIDER_SITE_OTHER)
Admission: RE | Admit: 2016-09-12 | Discharge: 2016-09-12 | Disposition: A | Discharge status: Home / Self Care | Payer: Medicare Other | Source: Ambulatory Visit | Attending: Emergency Medicine | Admitting: Emergency Medicine

## 2016-09-12 DIAGNOSIS — R911 Solitary pulmonary nodule: Secondary | ICD-10-CM | POA: Diagnosis not present

## 2016-09-21 DIAGNOSIS — E1165 Type 2 diabetes mellitus with hyperglycemia: Secondary | ICD-10-CM | POA: Diagnosis not present

## 2016-09-21 DIAGNOSIS — Z794 Long term (current) use of insulin: Secondary | ICD-10-CM | POA: Diagnosis not present

## 2016-09-21 DIAGNOSIS — R609 Edema, unspecified: Secondary | ICD-10-CM | POA: Diagnosis not present

## 2016-10-17 ENCOUNTER — Telehealth: Payer: Self-pay | Admitting: *Deleted

## 2016-10-17 ENCOUNTER — Encounter (HOSPITAL_COMMUNITY): Payer: Self-pay | Admitting: Emergency Medicine

## 2016-10-17 ENCOUNTER — Inpatient Hospital Stay (HOSPITAL_COMMUNITY)
Admission: EM | Admit: 2016-10-17 | Discharge: 2016-10-20 | DRG: 689 | Disposition: A | Discharge status: Skilled Nursing Facility | Payer: Medicare Other | Attending: Internal Medicine | Admitting: Internal Medicine

## 2016-10-17 ENCOUNTER — Emergency Department (HOSPITAL_COMMUNITY): Payer: Medicare Other

## 2016-10-17 DIAGNOSIS — J449 Chronic obstructive pulmonary disease, unspecified: Secondary | ICD-10-CM | POA: Diagnosis present

## 2016-10-17 DIAGNOSIS — I10 Essential (primary) hypertension: Secondary | ICD-10-CM

## 2016-10-17 DIAGNOSIS — K219 Gastro-esophageal reflux disease without esophagitis: Secondary | ICD-10-CM | POA: Diagnosis present

## 2016-10-17 DIAGNOSIS — E785 Hyperlipidemia, unspecified: Secondary | ICD-10-CM

## 2016-10-17 DIAGNOSIS — I110 Hypertensive heart disease with heart failure: Secondary | ICD-10-CM | POA: Diagnosis not present

## 2016-10-17 DIAGNOSIS — I4891 Unspecified atrial fibrillation: Secondary | ICD-10-CM | POA: Diagnosis present

## 2016-10-17 DIAGNOSIS — J439 Emphysema, unspecified: Secondary | ICD-10-CM | POA: Diagnosis present

## 2016-10-17 DIAGNOSIS — D509 Iron deficiency anemia, unspecified: Secondary | ICD-10-CM

## 2016-10-17 DIAGNOSIS — J438 Other emphysema: Secondary | ICD-10-CM

## 2016-10-17 DIAGNOSIS — Z6841 Body Mass Index (BMI) 40.0 and over, adult: Secondary | ICD-10-CM | POA: Diagnosis not present

## 2016-10-17 DIAGNOSIS — E1122 Type 2 diabetes mellitus with diabetic chronic kidney disease: Secondary | ICD-10-CM | POA: Diagnosis not present

## 2016-10-17 DIAGNOSIS — Z7952 Long term (current) use of systemic steroids: Secondary | ICD-10-CM

## 2016-10-17 DIAGNOSIS — N183 Chronic kidney disease, stage 3 unspecified: Secondary | ICD-10-CM | POA: Diagnosis present

## 2016-10-17 DIAGNOSIS — Z87891 Personal history of nicotine dependence: Secondary | ICD-10-CM

## 2016-10-17 DIAGNOSIS — G8929 Other chronic pain: Secondary | ICD-10-CM | POA: Diagnosis present

## 2016-10-17 DIAGNOSIS — Z79899 Other long term (current) drug therapy: Secondary | ICD-10-CM

## 2016-10-17 DIAGNOSIS — R300 Dysuria: Secondary | ICD-10-CM | POA: Diagnosis not present

## 2016-10-17 DIAGNOSIS — I480 Paroxysmal atrial fibrillation: Secondary | ICD-10-CM

## 2016-10-17 DIAGNOSIS — I130 Hypertensive heart and chronic kidney disease with heart failure and stage 1 through stage 4 chronic kidney disease, or unspecified chronic kidney disease: Secondary | ICD-10-CM | POA: Diagnosis present

## 2016-10-17 DIAGNOSIS — R319 Hematuria, unspecified: Secondary | ICD-10-CM

## 2016-10-17 DIAGNOSIS — Z7901 Long term (current) use of anticoagulants: Secondary | ICD-10-CM

## 2016-10-17 DIAGNOSIS — E669 Obesity, unspecified: Secondary | ICD-10-CM | POA: Diagnosis present

## 2016-10-17 DIAGNOSIS — N390 Urinary tract infection, site not specified: Principal | ICD-10-CM

## 2016-10-17 DIAGNOSIS — Z794 Long term (current) use of insulin: Secondary | ICD-10-CM

## 2016-10-17 DIAGNOSIS — Z888 Allergy status to other drugs, medicaments and biological substances status: Secondary | ICD-10-CM

## 2016-10-17 DIAGNOSIS — F329 Major depressive disorder, single episode, unspecified: Secondary | ICD-10-CM | POA: Diagnosis present

## 2016-10-17 DIAGNOSIS — R509 Fever, unspecified: Secondary | ICD-10-CM | POA: Diagnosis not present

## 2016-10-17 DIAGNOSIS — I5032 Chronic diastolic (congestive) heart failure: Secondary | ICD-10-CM

## 2016-10-17 DIAGNOSIS — I5033 Acute on chronic diastolic (congestive) heart failure: Secondary | ICD-10-CM | POA: Diagnosis not present

## 2016-10-17 DIAGNOSIS — I509 Heart failure, unspecified: Secondary | ICD-10-CM

## 2016-10-17 DIAGNOSIS — M545 Low back pain: Secondary | ICD-10-CM | POA: Diagnosis present

## 2016-10-17 LAB — CBC WITH DIFFERENTIAL/PLATELET
Basophils Absolute: 0 10*3/uL (ref 0.0–0.1)
Basophils Relative: 0 %
EOS ABS: 0 10*3/uL (ref 0.0–0.7)
Eosinophils Relative: 0 %
HEMATOCRIT: 35.2 % — AB (ref 36.0–46.0)
HEMOGLOBIN: 11 g/dL — AB (ref 12.0–15.0)
LYMPHS ABS: 0.6 10*3/uL — AB (ref 0.7–4.0)
LYMPHS PCT: 6 %
MCH: 29.4 pg (ref 26.0–34.0)
MCHC: 31.3 g/dL (ref 30.0–36.0)
MCV: 94.1 fL (ref 78.0–100.0)
MONOS PCT: 5 %
Monocytes Absolute: 0.5 10*3/uL (ref 0.1–1.0)
NEUTROS PCT: 89 %
Neutro Abs: 9.6 10*3/uL — ABNORMAL HIGH (ref 1.7–7.7)
Platelets: 231 10*3/uL (ref 150–400)
RBC: 3.74 MIL/uL — AB (ref 3.87–5.11)
RDW: 17.5 % — ABNORMAL HIGH (ref 11.5–15.5)
WBC: 10.7 10*3/uL — ABNORMAL HIGH (ref 4.0–10.5)

## 2016-10-17 LAB — LACTIC ACID, PLASMA: LACTIC ACID, VENOUS: 0.9 mmol/L (ref 0.5–1.9)

## 2016-10-17 LAB — URINALYSIS, ROUTINE W REFLEX MICROSCOPIC
BILIRUBIN URINE: NEGATIVE
Glucose, UA: NEGATIVE mg/dL
KETONES UR: NEGATIVE mg/dL
NITRITE: POSITIVE — AB
Protein, ur: NEGATIVE mg/dL
SPECIFIC GRAVITY, URINE: 1.008 (ref 1.005–1.030)
pH: 6 (ref 5.0–8.0)

## 2016-10-17 LAB — URINE MICROSCOPIC-ADD ON

## 2016-10-17 LAB — TROPONIN I

## 2016-10-17 LAB — COMPREHENSIVE METABOLIC PANEL
ALK PHOS: 58 U/L (ref 38–126)
ALT: 16 U/L (ref 14–54)
ANION GAP: 10 (ref 5–15)
AST: 31 U/L (ref 15–41)
Albumin: 3.6 g/dL (ref 3.5–5.0)
BILIRUBIN TOTAL: 0.8 mg/dL (ref 0.3–1.2)
BUN: 31 mg/dL — ABNORMAL HIGH (ref 6–20)
CALCIUM: 9.1 mg/dL (ref 8.9–10.3)
CO2: 30 mmol/L (ref 22–32)
CREATININE: 1.53 mg/dL — AB (ref 0.44–1.00)
Chloride: 96 mmol/L — ABNORMAL LOW (ref 101–111)
GFR, EST AFRICAN AMERICAN: 38 mL/min — AB (ref >60)
GFR, EST NON AFRICAN AMERICAN: 33 mL/min — AB (ref >60)
Glucose, Bld: 76 mg/dL (ref 65–99)
Potassium: 4.7 mmol/L (ref 3.5–5.1)
SODIUM: 136 mmol/L (ref 135–145)
TOTAL PROTEIN: 7.1 g/dL (ref 6.5–8.1)

## 2016-10-17 LAB — BRAIN NATRIURETIC PEPTIDE: B NATRIURETIC PEPTIDE 5: 284.1 pg/mL — AB (ref 0.0–100.0)

## 2016-10-17 LAB — GLUCOSE, CAPILLARY: GLUCOSE-CAPILLARY: 150 mg/dL — AB (ref 65–99)

## 2016-10-17 LAB — I-STAT TROPONIN, ED: Troponin i, poc: 0.01 ng/mL (ref 0.00–0.08)

## 2016-10-17 MED ORDER — PANTOPRAZOLE SODIUM 40 MG PO TBEC
40.0000 mg | DELAYED_RELEASE_TABLET | Freq: Every day | ORAL | Status: DC
  Administered 2016-10-18 – 2016-10-20 (×3): 40 mg via ORAL
  Filled 2016-10-17 (×3): qty 1

## 2016-10-17 MED ORDER — APIXABAN 5 MG PO TABS
5.0000 mg | ORAL_TABLET | Freq: Two times a day (BID) | ORAL | Status: DC
  Administered 2016-10-17 – 2016-10-20 (×6): 5 mg via ORAL
  Filled 2016-10-17 (×6): qty 1

## 2016-10-17 MED ORDER — SODIUM CHLORIDE 0.9% FLUSH
3.0000 mL | Freq: Two times a day (BID) | INTRAVENOUS | Status: DC
  Administered 2016-10-17 – 2016-10-20 (×6): 3 mL via INTRAVENOUS

## 2016-10-17 MED ORDER — BUDESONIDE 0.5 MG/2ML IN SUSP
0.5000 mg | Freq: Two times a day (BID) | RESPIRATORY_TRACT | Status: DC
  Administered 2016-10-18 – 2016-10-20 (×5): 0.5 mg via RESPIRATORY_TRACT
  Filled 2016-10-17 (×5): qty 2

## 2016-10-17 MED ORDER — FERROUS SULFATE 325 (65 FE) MG PO TABS
325.0000 mg | ORAL_TABLET | Freq: Two times a day (BID) | ORAL | Status: DC
  Administered 2016-10-18 – 2016-10-20 (×5): 325 mg via ORAL
  Filled 2016-10-17 (×5): qty 1

## 2016-10-17 MED ORDER — VITAMIN C 500 MG PO TABS
500.0000 mg | ORAL_TABLET | Freq: Every day | ORAL | Status: DC
  Administered 2016-10-17 – 2016-10-20 (×4): 500 mg via ORAL
  Filled 2016-10-17 (×4): qty 1

## 2016-10-17 MED ORDER — DILTIAZEM HCL ER COATED BEADS 180 MG PO CP24
180.0000 mg | ORAL_CAPSULE | Freq: Every day | ORAL | Status: DC
  Administered 2016-10-18 – 2016-10-20 (×3): 180 mg via ORAL
  Filled 2016-10-17 (×3): qty 1

## 2016-10-17 MED ORDER — SODIUM CHLORIDE 0.9% FLUSH: 3.0000 mL | INTRAVENOUS | Status: DC | PRN

## 2016-10-17 MED ORDER — POTASSIUM CHLORIDE CRYS ER 20 MEQ PO TBCR
20.0000 meq | EXTENDED_RELEASE_TABLET | Freq: Three times a day (TID) | ORAL | Status: DC
  Administered 2016-10-17 – 2016-10-20 (×7): 20 meq via ORAL
  Filled 2016-10-17 (×8): qty 1

## 2016-10-17 MED ORDER — METOLAZONE 2.5 MG PO TABS
2.5000 mg | ORAL_TABLET | Freq: Every day | ORAL | Status: DC | PRN
  Administered 2016-10-19: 2.5 mg via ORAL
  Filled 2016-10-17: qty 1

## 2016-10-17 MED ORDER — EZETIMIBE-SIMVASTATIN 10-40 MG PO TABS
1.0000 | ORAL_TABLET | Freq: Every day | ORAL | Status: DC
  Administered 2016-10-17 – 2016-10-19 (×3): 1 via ORAL
  Filled 2016-10-17 (×3): qty 1

## 2016-10-17 MED ORDER — FUROSEMIDE 10 MG/ML IJ SOLN
40.0000 mg | Freq: Once | INTRAMUSCULAR | Status: AC
  Administered 2016-10-17: 40 mg via INTRAVENOUS
  Filled 2016-10-17: qty 4

## 2016-10-17 MED ORDER — ONDANSETRON HCL 4 MG/2ML IJ SOLN: 4.0000 mg | Freq: Four times a day (QID) | INTRAMUSCULAR | Status: DC | PRN

## 2016-10-17 MED ORDER — ALBUTEROL SULFATE (2.5 MG/3ML) 0.083% IN NEBU: 2.5000 mg | INHALATION_SOLUTION | RESPIRATORY_TRACT | Status: DC | PRN

## 2016-10-17 MED ORDER — ALBUTEROL SULFATE (2.5 MG/3ML) 0.083% IN NEBU: 2.5000 mg | INHALATION_SOLUTION | Freq: Four times a day (QID) | RESPIRATORY_TRACT | Status: DC | PRN

## 2016-10-17 MED ORDER — FUROSEMIDE 10 MG/ML IJ SOLN
40.0000 mg | Freq: Two times a day (BID) | INTRAMUSCULAR | Status: DC
  Administered 2016-10-18: 40 mg via INTRAVENOUS
  Filled 2016-10-17: qty 4

## 2016-10-17 MED ORDER — ARFORMOTEROL TARTRATE 15 MCG/2ML IN NEBU
15.0000 ug | INHALATION_SOLUTION | Freq: Two times a day (BID) | RESPIRATORY_TRACT | Status: DC
  Administered 2016-10-18 – 2016-10-20 (×5): 15 ug via RESPIRATORY_TRACT
  Filled 2016-10-17 (×5): qty 2

## 2016-10-17 MED ORDER — VITAMIN D 1000 UNITS PO TABS
2000.0000 [IU] | ORAL_TABLET | Freq: Every day | ORAL | Status: DC
  Administered 2016-10-17 – 2016-10-20 (×4): 2000 [IU] via ORAL
  Filled 2016-10-17 (×4): qty 2

## 2016-10-17 MED ORDER — CALCIUM CARBONATE-VITAMIN D 500-200 MG-UNIT PO TABS
1.0000 | ORAL_TABLET | Freq: Every day | ORAL | Status: DC
  Administered 2016-10-18 – 2016-10-20 (×3): 1 via ORAL
  Filled 2016-10-17 (×3): qty 1

## 2016-10-17 MED ORDER — DEXTROSE 5 % IV SOLN
1.0000 g | INTRAVENOUS | Status: DC
  Administered 2016-10-18: 1 g via INTRAVENOUS
  Filled 2016-10-17 (×4): qty 10

## 2016-10-17 MED ORDER — ACETAMINOPHEN 325 MG PO TABS
650.0000 mg | ORAL_TABLET | Freq: Once | ORAL | Status: AC
  Administered 2016-10-17: 650 mg via ORAL
  Filled 2016-10-17: qty 2

## 2016-10-17 MED ORDER — ALLOPURINOL 100 MG PO TABS
100.0000 mg | ORAL_TABLET | Freq: Every day | ORAL | Status: DC
  Administered 2016-10-18 – 2016-10-20 (×3): 100 mg via ORAL
  Filled 2016-10-17 (×3): qty 1

## 2016-10-17 MED ORDER — TIOTROPIUM BROMIDE MONOHYDRATE 18 MCG IN CAPS
18.0000 ug | ORAL_CAPSULE | Freq: Every day | RESPIRATORY_TRACT | Status: DC
  Administered 2016-10-18 – 2016-10-20 (×3): 18 ug via RESPIRATORY_TRACT
  Filled 2016-10-17: qty 5

## 2016-10-17 MED ORDER — BENZONATATE 100 MG PO CAPS: 200.0000 mg | ORAL_CAPSULE | Freq: Two times a day (BID) | ORAL | Status: DC | PRN

## 2016-10-17 MED ORDER — AMIODARONE HCL 200 MG PO TABS
200.0000 mg | ORAL_TABLET | Freq: Every day | ORAL | Status: DC
  Administered 2016-10-18 – 2016-10-20 (×3): 200 mg via ORAL
  Filled 2016-10-17 (×3): qty 1

## 2016-10-17 MED ORDER — INSULIN ASPART 100 UNIT/ML ~~LOC~~ SOLN
0.0000 [IU] | Freq: Three times a day (TID) | SUBCUTANEOUS | Status: DC
  Administered 2016-10-18: 3 [IU] via SUBCUTANEOUS
  Administered 2016-10-18: 5 [IU] via SUBCUTANEOUS
  Administered 2016-10-18: 3 [IU] via SUBCUTANEOUS
  Administered 2016-10-19: 5 [IU] via SUBCUTANEOUS
  Administered 2016-10-19: 8 [IU] via SUBCUTANEOUS
  Administered 2016-10-20 (×2): 5 [IU] via SUBCUTANEOUS

## 2016-10-17 MED ORDER — INSULIN ASPART 100 UNIT/ML ~~LOC~~ SOLN
0.0000 [IU] | Freq: Every day | SUBCUTANEOUS | Status: DC
  Administered 2016-10-18: 2 [IU] via SUBCUTANEOUS
  Administered 2016-10-19: 3 [IU] via SUBCUTANEOUS

## 2016-10-17 MED ORDER — ACETAMINOPHEN 325 MG PO TABS
650.0000 mg | ORAL_TABLET | ORAL | Status: DC | PRN
Start: 2016-10-17 — End: 2016-10-20

## 2016-10-17 MED ORDER — SODIUM CHLORIDE 0.9 % IV SOLN: 250.0000 mL | INTRAVENOUS | Status: DC | PRN

## 2016-10-17 MED ORDER — GUAIFENESIN 100 MG/5ML PO SOLN: 200.0000 mg | Freq: Three times a day (TID) | ORAL | Status: DC | PRN

## 2016-10-17 MED ORDER — METOPROLOL TARTRATE 25 MG PO TABS
50.0000 mg | ORAL_TABLET | Freq: Two times a day (BID) | ORAL | Status: DC
Start: 2016-10-17 — End: 2016-10-20
  Administered 2016-10-17 – 2016-10-20 (×6): 50 mg via ORAL
  Filled 2016-10-17 (×6): qty 2

## 2016-10-17 MED ORDER — DEXTROSE 5 % IV SOLN
1.0000 g | Freq: Once | INTRAVENOUS | Status: AC
  Administered 2016-10-17: 1 g via INTRAVENOUS
  Filled 2016-10-17: qty 10

## 2016-10-17 MED ORDER — IPRATROPIUM-ALBUTEROL 0.5-2.5 (3) MG/3ML IN SOLN
3.0000 mL | Freq: Once | RESPIRATORY_TRACT | Status: AC
Start: 2016-10-17 — End: 2016-10-17
  Administered 2016-10-17: 3 mL via RESPIRATORY_TRACT
  Filled 2016-10-17: qty 3

## 2016-10-17 NOTE — Telephone Encounter (Signed)
Patient not feeling well. She is fatigued with a poor appetite. She is also having trouble maintaining her O2 saturation above 90%. Her niece called the office to see if she could be seen sooner than her schedule 11/23/16 appointment.   During the initial conversation, niece was instructed to call Dr Lamonte Sakai, the patient's pulmonologist. The niece agreed.  Spoke to Dr Marin Olp about bringing the patient in sooner for workup for her anemia. Dr Marin Olp stated to bring patient in, in the next 2-3 weeks.  When the niece was called to schedule appointment, she stated another physician was admitting patient for low sats, fever and blood in urine. She will call the office back upon discharge if she still feels the patient needs to be seen sooner than 11/23/16.

## 2016-10-17 NOTE — ED Notes (Signed)
MD at bedside. 

## 2016-10-17 NOTE — Progress Notes (Signed)
Pharmacy Antibiotic Note  Paige Hardin is a 70 y.o. female admitted on 10/17/2016 with UTI.  Pharmacy has been consulted for Rocephin dosing.  Rocephin 1gm IV x 1 given in ED ~ 1700 today.  Plan: Rocephin 1gm IV q24h - next dose due 11/22 Rocephin does not require renal dose adjustment.  Pharmacy will sign off.  Height: 5\' 5"  (165.1 cm) Weight: 294 lb 11.2 oz (133.7 kg) (c scale) IBW/kg (Calculated) : 57  Temp (24hrs), Avg:98.7 F (37.1 C), Min:97.9 F (36.6 C), Max:100.1 F (37.8 C)   Recent Labs Lab 10/17/16 1419  WBC 10.7*  CREATININE 1.53*  LATICACIDVEN 0.9    Estimated Creatinine Clearance: 46.7 mL/min (by C-G formula based on SCr of 1.53 mg/dL (H)).    Allergies  Allergen Reactions  . Ace Inhibitors Cough  . Clindamycin Rash    Thank you for allowing pharmacy to be a part of this patient's care.  [employer], Pharm.D., BCPS Clinical Pharmacist Pager [phone] 10/17/2016 8:26 PM

## 2016-10-17 NOTE — ED Provider Notes (Signed)
Medical screening examination/treatment/procedure(s) were conducted as a shared visit with non-physician practitioner(s) and myself.  I personally evaluated the patient during the encounter.   EKG Interpretation  Date/Time:  Tuesday October 17 2016 14:04:56 EST Ventricular Rate:  72 PR Interval:    QRS Duration: 104 QT Interval:  453 QTC Calculation: 496 R Axis:   50 Text Interpretation:  Sinus rhythm Low voltage, precordial leads Abnormal R-wave progression, early transition Borderline prolonged QT interval agree. no STEMI Confirmed by Johnney Killian, MD, Jeannie Done [phone]) on 10/17/2016 6:23:25 PM     Patient has had dysuria and fever. Urinalysis is confirmatory for UTI. Patient also reports increased dyspnea with a history of COPD and baseline O2 use. Patient is alert. Respiratory status is stable. She is on 2 L oxygen but does not have acute respiratory distress at rest. Plan will be for admission for treatment.   Charlesetta Shanks, MD 10/17/16 [phone]

## 2016-10-17 NOTE — ED Triage Notes (Signed)
Patient present today from [place]  with complaints of fever and chills x 3days. Patient temp today per EMS 101.1 oral Recally on arrival 100.1. Patient complains of 2/10 headache. Patient Alert and orited x4 on arrival. SOB with exertion. Patient states she has had some intermitted blood in urine x2 days. Patient has HX of COPD and wears 2L Baseline.

## 2016-10-17 NOTE — ED Notes (Signed)
RN attempted to get LABS x2

## 2016-10-17 NOTE — Progress Notes (Signed)
Patient arrived to the unit and was short of breath, no complaints of pain.

## 2016-10-17 NOTE — ED Notes (Signed)
Phlebotomy at bedside.

## 2016-10-17 NOTE — H&P (Addendum)
History and Physical    Paige Hardin [id number] DOB: 06-Jan-1946 DOA: 10/17/2016  Referring MD/NP/PA: Emmaline Life PCP: Stephens Shire, MD  Patient coming from: [facility]  Chief Complaint: Was running a temperature and had blood in my urine  HPI: Paige Hardin is a 70 y.o. female with medical history significant of HTN, HLD,  diastolic CHF, COPD, oxygen dependent 2 L, PAF on chronic anticoagulation; who presents with complaints of fever and blood in her urine. Patient notes that she's noticed blood in her urine for the last 2 days. Pain when she was checked by nursing staff today she does not have a temperature up to 1022F. Associated symptoms include generalized malaise, dyspnea on exertion, nausea, leg swelling, orthopnea, and insomnia. Patient notes that she has to stop and catch her breath going to the dining hall at night and then her oxygen saturations drop regularly into the 80s. She denies any significant changes in medications, vomiting, dysuria, increased urinary frequency, cough, sore throat, abdominal pain, diarrhea, or chest pain.   ED Course: Upon admission into the emergency department patient was evaluated and seen to be febrile up to 100.22F, pulse 63-72, respirations 15-22, O2 saturations 90-92% on 2 L of oxygen, and blood pressures maintained. Lab work revealed WBC 10.7, hemoglobin 11, BUN 31, creatinine 1.53, BNP 284.1, . Urinalysis positive for many bacteria, moderate leukocytes, positive nitrite, 0-5 squamous epithelial cells, 6-30 WBCs. Chest x-ray showed cardiomegaly with mild interstitial edema. Patient was given Rocephin and 40 mg of Lasix IV 1 dose. TRH called to admit.  Review of Systems: As per HPI otherwise 10 point review of systems negative.   Past Medical History:  Diagnosis Date  . Arthritis    "joints" (01/14/2014)  . Carotid atherosclerosis   . Chronic low back pain   . COPD (chronic obstructive pulmonary disease) ([place])   . Depression     "maybe" (01/14/2014  . Diastolic congestive heart failure ([place])   . Emphysema   . Excessive daytime sleepiness 09/08/2015  . GERD (gastroesophageal reflux disease)   . Hoarseness, chronic   . Hyperlipidemia   . Lung nodules   . Obese   . Paroxysmal atrial fibrillation ([facility])    a. failed flecainide;  b. 03/2014 amio started->DCCV;  c. Chronic pradaxa.  . Pneumonia 1990's   "once"  . Type 2 diabetes mellitus ([place])     Past Surgical History:  Procedure Laterality Date  . BREAST BIOPSY Left ~ 1980   "solid"  . BREAST LUMPECTOMY Left ~ 1980   "removed a mass; it was benign" (01/14/2014)  . CARDIOVERSION N/A 03/11/2014   Procedure: CARDIOVERSION;  Surgeon: Sanda Klein, MD;  Location: [facility];  Service: Cardiovascular;  Laterality: N/A;  . CARDIOVERSION N/A 03/27/2014   Procedure: CARDIOVERSION;  Surgeon: Darlin Coco, MD;  Location: [facility];  Service: Cardiovascular;  Laterality: N/A;  . COLONOSCOPY  01/26/2012   Procedure: COLONOSCOPY;  Surgeon: Beryle Beams, MD;  Location: [facility];  Service: Endoscopy;  Laterality: N/A;  . ESOPHAGOGASTRODUODENOSCOPY  01/26/2012   Procedure: ESOPHAGOGASTRODUODENOSCOPY (EGD);  Surgeon: Beryle Beams, MD;  Location: [facility];  Service: Endoscopy;  Laterality: N/A;  . TOTAL ABDOMINAL HYSTERECTOMY  1994     reports that she quit smoking about 7 years ago. Her smoking use included Cigarettes. She started smoking about 52 years ago. She has a 135.00 pack-year smoking history. She has never used smokeless tobacco. She reports that she drinks alcohol. She reports that she does not  use drugs.  Allergies  Allergen Reactions  . Ace Inhibitors Cough  . Clindamycin Rash    Family History  Problem Relation Age of Onset  . Breast cancer Sister   . Heart disease    . Hodgkin's lymphoma    . Asthma Mother     Prior to Admission medications   Medication Sig Start Date End Date Taking? Authorizing Provider  acetaminophen (TYLENOL) 325 MG tablet  Take 325 mg by mouth daily as needed for mild pain.    Yes Historical Provider, MD  albuterol (PROVENTIL HFA;VENTOLIN HFA) 108 (90 Base) MCG/ACT inhaler Inhale 2 puffs into the lungs every 6 (six) hours as needed for wheezing or shortness of breath.   Yes Historical Provider, MD  albuterol (PROVENTIL) (2.5 MG/3ML) 0.083% nebulizer solution Take 2.5 mg by nebulization every 6 (six) hours as needed for wheezing or shortness of breath.   Yes Historical Provider, MD  allopurinol (ZYLOPRIM) 100 MG tablet Take 1 tablet (100 mg total) by mouth daily. 05/20/15  Yes Larey Dresser, MD  amiodarone (PACERONE) 200 MG tablet Take 1 tablet (200 mg total) by mouth daily. 04/23/14  Yes Liliane Shi, PA-C  apixaban (ELIQUIS) 5 MG TABS tablet Take 1 tablet (5 mg total) by mouth 2 (two) times daily. 04/03/14  Yes Grace Bushy Minor, NP  Ascorbic Acid (VITAMIN C) 500 MG CAPS Take 500 mg by mouth daily.    Yes Historical Provider, MD  benzonatate (TESSALON) 200 MG capsule Take 1 capsule (200 mg total) by mouth 2 (two) times daily as needed for cough. 11/18/15  Yes Ripudeep Krystal Eaton, MD  budesonide-formoterol (SYMBICORT) 160-4.5 MCG/ACT inhaler Inhale 2 puffs into the lungs 2 (two) times daily. 05/18/15  Yes Collene Gobble, MD  calcium-vitamin D (OSCAL WITH D) 500-200 MG-UNIT per tablet Take 1 tablet by mouth daily with breakfast.   Yes Historical Provider, MD  Cholecalciferol (VITAMIN D) 2000 UNITS tablet Take 2,000 Units by mouth daily.   Yes Historical Provider, MD  diltiazem (CARDIZEM CD) 180 MG 24 hr capsule Take 1 capsule (180 mg total) by mouth daily. 03/12/14  Yes Brooke O Edmisten, PA-C  ezetimibe-simvastatin (VYTORIN) 10-40 MG per tablet Take 1 tablet by mouth at bedtime.    Yes Historical Provider, MD  ferrous sulfate 325 (65 FE) MG tablet Take 325 mg by mouth 2 (two) times daily with a meal.    Yes Historical Provider, MD  furosemide (LASIX) 80 MG tablet Take 2 tablets (160 mg total) by mouth 2 (two) times daily. MAKE  SURE TO TAKE AT 8 AM AND 2 PM 04/23/14  Yes Liliane Shi, PA-C  guaiFENesin (DIABETIC TUSSIN EX) 100 MG/5ML liquid Take 200 mg by mouth 3 (three) times daily as needed for cough.   Yes Historical Provider, MD  insulin aspart (NOVOLOG) 100 UNIT/ML injection Inject 5 Units into the skin 3 (three) times daily with meals. Patient taking differently: Inject 14 Units into the skin 3 (three) times daily with meals.  11/19/15  Yes Ripudeep Krystal Eaton, MD  insulin aspart (NOVOLOG) 100 UNIT/ML injection Inject 0-9 Units into the skin 3 (three) times daily with meals. Correction factor Sliding scale CBG 70 - 120: 0 units CBG 121 - 150: 1 unit,  CBG 151 - 200: 2 units,  CBG 201 - 250: 3 units,  CBG 251 - 300: 5 units,  CBG 301 - 350: 7 units,  CBG 351 - 400: 9 units   CBG > 400: 9 units and  notify your MD 11/19/15  Yes Ripudeep Krystal Eaton, MD  insulin NPH Human (HUMULIN N,NOVOLIN N) 100 UNIT/ML injection Inject 20 Units into the skin 2 (two) times daily before a meal.  04/04/16  Yes Historical Provider, MD  metolazone (ZAROXOLYN) 2.5 MG tablet Take 1 tablet only if weight is greater or equal to 300 lbs as needed Patient taking differently: Take 2.5 mg by mouth daily as needed. Take 1 tablet only if weight is greater or equal to 300 lbs as needed 07/13/15  Yes Jolaine Artist, MD  metoprolol (LOPRESSOR) 50 MG tablet Take 1 tablet (50 mg total) by mouth 2 (two) times daily. 03/12/14  Yes Brooke O Edmisten, PA-C  Multiple Vitamins-Iron (MULTIVITAMIN/IRON PO) Take 1 tablet by mouth daily.   Yes Historical Provider, MD  omeprazole (PRILOSEC) 20 MG capsule Take 20 mg by mouth daily.   Yes Historical Provider, MD  OXYGEN Inhale 2 L into the lungs continuous.   Yes Historical Provider, MD  potassium chloride SA (K-DUR,KLOR-CON) 20 MEQ tablet Take 20 mEq by mouth 3 (three) times daily. MAKE SURE TO TAKE EXTRA 20 MEQ ON METOLAZONE DAYS 04/23/14  Yes Liliane Shi, PA-C  tiotropium (SPIRIVA) 18 MCG inhalation capsule Place 1 capsule  (18 mcg total) into inhaler and inhale daily. 05/18/15  Yes Collene Gobble, MD  vitamin B-12 (CYANOCOBALAMIN) 500 MCG tablet Take 500 mcg by mouth daily.   Yes Historical Provider, MD    Physical Exam:   Constitutional: Elderly obese female who is chronically sickly appearing, but in NAD, calm, comfortable Vitals:   10/17/16 1815 10/17/16 1819 10/17/16 1830 10/17/16 1845  BP:   131/59   Pulse: 66 66 64 64  Resp: 18 19 18 19   Temp:      TempSrc:      SpO2: 91% 92% 94% 93%   Eyes: PERRL, lids and conjunctivae normal ENMT: Mucous membranes are moist. Posterior pharynx clear of any exudate or lesions. Neck: normal, supple, no masses, no thyromegaly Respiratory: Decreased overall breath sounds bilaterally. Some fine crackles appreciated, but no wheezing appreciated after breathing treatment.  Normal respiratory effort. No accessory muscle use.  Cardiovascular: Regular rate and rhythm, no murmurs / rubs / gallops. +1 pitting lower  extremity edema. 2+ pedal pulses. No carotid bruits.  Abdomen: no tenderness, no masses palpated. No hepatosplenomegaly. Bowel sounds positive.  Musculoskeletal: no clubbing / cyanosis. No joint deformity upper and lower extremities. Good ROM, no contractures. Normal muscle tone.  Skin: no rashes, lesions, ulcers. No induration Neurologic: CN 2-12 grossly intact. Sensation intact, DTR normal. Strength 5/5 in all 4.  Psychiatric: Normal judgment and insight. Alert and oriented x 3. Normal mood.     Labs on Admission: I have personally reviewed following labs and imaging studies  CBC:  Recent Labs Lab 10/17/16 1419  WBC 10.7*  NEUTROABS 9.6*  HGB 11.0*  HCT 35.2*  MCV 94.1  PLT [id number]   Basic Metabolic Panel:  Recent Labs Lab 10/17/16 1419  NA 136  K 4.7  CL 96*  CO2 30  GLUCOSE 76  BUN 31*  CREATININE 1.53*  CALCIUM 9.1   GFR: CrCl cannot be calculated (Unknown ideal weight.). Liver Function Tests:  Recent Labs Lab 10/17/16 1419  AST  31  ALT 16  ALKPHOS 58  BILITOT 0.8  PROT 7.1  ALBUMIN 3.6   No results for input(s): LIPASE, AMYLASE in the last 168 hours. No results for input(s): AMMONIA in the last 168 hours. Coagulation Profile:  No results for input(s): INR, PROTIME in the last 168 hours. Cardiac Enzymes: No results for input(s): CKTOTAL, CKMB, CKMBINDEX, TROPONINI in the last 168 hours. BNP (last 3 results) No results for input(s): PROBNP in the last 8760 hours. HbA1C: No results for input(s): HGBA1C in the last 72 hours. CBG: No results for input(s): GLUCAP in the last 168 hours. Lipid Profile: No results for input(s): CHOL, HDL, LDLCALC, TRIG, CHOLHDL, LDLDIRECT in the last 72 hours. Thyroid Function Tests: No results for input(s): TSH, T4TOTAL, FREET4, T3FREE, THYROIDAB in the last 72 hours. Anemia Panel: No results for input(s): VITAMINB12, FOLATE, FERRITIN, TIBC, IRON, RETICCTPCT in the last 72 hours. Urine analysis:    Component Value Date/Time   COLORURINE YELLOW 10/17/2016 [place] 10/17/2016 1608   LABSPEC 1.008 10/17/2016 1608   PHURINE 6.0 10/17/2016 1608   GLUCOSEU NEGATIVE 10/17/2016 1608   HGBUR MODERATE (A) 10/17/2016 1608   BILIRUBINUR NEGATIVE 10/17/2016 [place] 10/17/2016 1608   PROTEINUR NEGATIVE 10/17/2016 1608   UROBILINOGEN 1.0 03/23/2014 1915   NITRITE POSITIVE (A) 10/17/2016 1608   LEUKOCYTESUR MODERATE (A) 10/17/2016 1608   Sepsis Labs: No results found for this or any previous visit (from the past 240 hour(s)).   Radiological Exams on Admission: Dg Chest 2 View  Result Date: 10/17/2016 CLINICAL DATA:  COPD, CHF, worsening weakness and shortness of breath with nausea for 1 week EXAM: CHEST  2 VIEW COMPARISON:  09/02/2015, 09/12/2016 FINDINGS: Cardiomegaly evident with increased vascular and interstitial prominence suspicious for early edema pattern/ CHF. Minor basilar atelectasis. No large effusion. Negative for pneumothorax. Trachea  is midline. Aortic atherosclerosis noted. Known 10 mm right lower lobe nodule again demonstrated, without significant change. IMPRESSION: Cardiomegaly with mild interstitial edema pattern. Aortic atherosclerosis 10 mm right lower lobe pulmonary nodule as previously described. Electronically Signed   By: Jerilynn Mages.  Shick M.D.   On: 10/17/2016 14:58    EKG: Independently reviewed. Sinus rhythm borderline QTC 496  Assessment/Plan Urinary tract infection with hematuria: Acute. Patient presents for hematuria and fever. UA positive for possible source of infection. Patient given Rocephin IV in the ED.  - Admit to a telemetry bed - Follow-up urine culture - Continue Rocephin IV - De-escalate antibiotics when able   Leukocytosis: WBC elevated at 10.7. Suspect that this is secondary to patient's acute infection as noted. - recheck CBC in am  Congestive heart failure: Acute on chronic. Last echocardiogram in 03/2015 showing EF of 55-60% with no wall motion abnormalities. Patient presents with a moderately elevated BNP of 284.1. Chest x-ray showing cardiomegaly with mild interstitial edema. Given 40 mg of Lasix IV in the ED. - Strict I&O's and Daily weights - Check echocardiogram - Lasix 40 mg IV every 12 hours - Metolazone as needed for weights greater than 300 per home regimen - may warrant formal cardiology consults   COPD: without acute exacerbation. Patient with acute onset of wheezing shortness of breath, coughing, and increased oxygen requirements. Suspect this could be acute COPD exacerbation versus bronchitis. Patient given 60 mg of by mouth prednisone in the emergency department. - Albuterol scheduled every 6 hours and when necessary every 2 hours - Continue medications of  Spiriva - Changed Symbicort inhaler to Pulmicort and Brovana nebs  - Reassess patient condition to see systemic steroids needed  PAF (paroxysmal atrial fibrillation) s/p multiple DCCVs; on Amiodarone: Rate Controlled on  amiodarone. Patient has a chadsvasc score of 5 and is on chronic anticoagulation of Eliquis. -  Continue Eliquis, amiodarone, diltiazem  Essential Hypertension: Stable - Continue metoprolol, all other medications seen above  Diabetes mellitus type 2: Patient found to have a blood glucose on admission of 499 no anion gap. Patient's last hemoglobin A1c was noted to be 8 back in August 2016. - Hypoglycemic protocols - CBGs every before meals and at bedtime with moderate sliding scale insulin   Chronic renal disease, stage III: Stable. Creatinine appears to be at baseline around 1.5  - Continue to monitor    Iron deficiency anemia: Hemoglobin at 11g/dL and this appear near patient's last admission. - Continue Ferrous Sulfate  Hyperlipidemia - Continue Vytorin  Morbid obesity- BMI 50  GERD - Pharmacy substitution of Protonix or omeprazole   DVT prophylaxis: Eliquis Code Status DNR Family Communication: Discussed overall plan of care with patient and niece present at bedside  Disposition Plan: Likely discharge back to nursing facility once medically stable  Consults called: None  Admission status: Observation telemetry  Norval Morton MD [facility] Pager [phone]  If 7PM-7AM, please contact night-coverage [email or web address] Password [email or web address]  10/17/2016, 7:07 PM

## 2016-10-17 NOTE — ED Notes (Signed)
PA at bedside,  

## 2016-10-17 NOTE — ED Provider Notes (Signed)
[facility] DEPT Provider Note   CSN: [id number] Arrival date & time: 10/17/16  1348     History   Chief Complaint Chief Complaint  Patient presents with  . Fever  . Hematuria    HPI Paige Hardin is a 70 y.o. female.  HPI JHAVIA Hardin is a 70 y.o. female from [place] with PMH significant for COPD, PAF on Eliquis, and CHF on 2L [place] home oxygen who presents with fever over the last couple of days with max temp of 101.1 with associated hematuria x 2 days.  She states she has been feeling lousy over the last couple of weeks and has noticed some increased dyspnea on exertion and states her oxygen saturations have been dropping down into the 80s..  She denies any weight gain or lower extremity edema.  She also reports decreased appetite and intermittent nausea.  No chest pain, cough, sore throat, rhinorrhea, abdominal pain, vomiting, or diarrhea.  Nothing makes her symptoms better or worse.  [facility]  Past Medical History:  Diagnosis Date  . Arthritis    "joints" (01/14/2014)  . Carotid atherosclerosis   . Chronic low back pain   . COPD (chronic obstructive pulmonary disease) ([place])   . Depression    "maybe" (01/14/2014  . Diastolic congestive heart failure ([place])   . Emphysema   . Excessive daytime sleepiness 09/08/2015  . GERD (gastroesophageal reflux disease)   . Hoarseness, chronic   . Hyperlipidemia   . Lung nodules   . Obese   . Paroxysmal atrial fibrillation (H[facility])    a. failed flecainide;  b. 03/2014 amio started->DCCV;  c. Chronic pradaxa.  . Pneumonia 1990's   "once"  . Type 2 diabetes mellitus [facility])     Patient Active Problem List   Diagnosis Date Noted  . Solitary pulmonary nodule 12/23/2015  . Type 2 diabetes mellitus with hyperglycemia ([place]) 11/16/2015  . Hyperlipidemia 11/16/2015  . Excessive daytime sleepiness 09/08/2015  . Iron deficiency anemia 03/29/2014  . Atrial fibrillation with RVR- DCCV  03/27/14 03/25/2014  . Altered mental status 02/24/2014  . HTN (hypertension) 01/21/2014  . Chronic anticoagulation 01/21/2014  . Pulmonary HTN- pa 53 mmHg Echo 01/15/14 01/21/2014  . Morbid obesity- BMI 50 01/21/2014  . Poorly controlled diabetes mellitus ([place]) 01/21/2014  . Chronic renal disease, stage III 01/21/2014  . Chronic diastolic heart failure ([place]) 01/14/2014  . CHEST PAIN 09/26/2010  . HYPERLIPIDEMIA-MIXED 05/14/2009  . HOARSENESS 05/14/2009  . CAROTID BRUIT 05/14/2009  . COPD with emphysema  05/05/2009  . PAF (paroxysmal atrial fibrillation) s/p multiple DCCVs; on Amiodarone 05/05/2009    Past Surgical History:  Procedure Laterality Date  . BREAST BIOPSY Left ~ 1980   "solid"  . BREAST LUMPECTOMY Left ~ 1980   "removed a mass; it was benign" (01/14/2014)  . CARDIOVERSION N/A 03/11/2014   Procedure: CARDIOVERSION;  Surgeon: Sanda Klein, MD;  Location: [facility];  Service: Cardiovascular;  Laterality: N/A;  . CARDIOVERSION N/A 03/27/2014   Procedure: CARDIOVERSION;  Surgeon: Darlin Coco, MD;  Location: [facility];  Service: Cardiovascular;  Laterality: N/A;  . COLONOSCOPY  01/26/2012   Procedure: COLONOSCOPY;  Surgeon: Beryle Beams, MD;  Location: [facility];  Service: Endoscopy;  Laterality: N/A;  . ESOPHAGOGASTRODUODENOSCOPY  01/26/2012   Procedure: ESOPHAGOGASTRODUODENOSCOPY (EGD);  Surgeon: Beryle Beams, MD;  Location: [facility];  Service: Endoscopy;  Laterality: N/A;  . TOTAL ABDOMINAL HYSTERECTOMY  1994    OB History  No data available       Home Medications    Prior to Admission medications   Medication Sig Start Date End Date Taking? Authorizing Provider  acetaminophen (TYLENOL) 325 MG tablet Take 325 mg by mouth daily as needed for mild pain.    Yes Historical Provider, MD  albuterol (PROVENTIL HFA;VENTOLIN HFA) 108 (90 Base) MCG/ACT inhaler Inhale 2 puffs into the lungs every 6 (six) hours as needed for wheezing or shortness of breath.   Yes Historical  Provider, MD  albuterol (PROVENTIL) (2.5 MG/3ML) 0.083% nebulizer solution Take 2.5 mg by nebulization every 6 (six) hours as needed for wheezing or shortness of breath.   Yes Historical Provider, MD  allopurinol (ZYLOPRIM) 100 MG tablet Take 1 tablet (100 mg total) by mouth daily. 05/20/15  Yes Larey Dresser, MD  amiodarone (PACERONE) 200 MG tablet Take 1 tablet (200 mg total) by mouth daily. 04/23/14  Yes Liliane Shi, PA-C  apixaban (ELIQUIS) 5 MG TABS tablet Take 1 tablet (5 mg total) by mouth 2 (two) times daily. 04/03/14  Yes Grace Bushy Minor, NP  Ascorbic Acid (VITAMIN C) 500 MG CAPS Take 500 mg by mouth daily.    Yes Historical Provider, MD  benzonatate (TESSALON) 200 MG capsule Take 1 capsule (200 mg total) by mouth 2 (two) times daily as needed for cough. 11/18/15  Yes Ripudeep Krystal Eaton, MD  budesonide-formoterol (SYMBICORT) 160-4.5 MCG/ACT inhaler Inhale 2 puffs into the lungs 2 (two) times daily. 05/18/15  Yes Collene Gobble, MD  calcium-vitamin D (OSCAL WITH D) 500-200 MG-UNIT per tablet Take 1 tablet by mouth daily with breakfast.   Yes Historical Provider, MD  Cholecalciferol (VITAMIN D) 2000 UNITS tablet Take 2,000 Units by mouth daily.   Yes Historical Provider, MD  diltiazem (CARDIZEM CD) 180 MG 24 hr capsule Take 1 capsule (180 mg total) by mouth daily. 03/12/14  Yes Brooke O Edmisten, PA-C  ezetimibe-simvastatin (VYTORIN) 10-40 MG per tablet Take 1 tablet by mouth at bedtime.    Yes Historical Provider, MD  ferrous sulfate 325 (65 FE) MG tablet Take 325 mg by mouth 2 (two) times daily with a meal.    Yes Historical Provider, MD  furosemide (LASIX) 80 MG tablet Take 2 tablets (160 mg total) by mouth 2 (two) times daily. MAKE SURE TO TAKE AT 8 AM AND 2 PM 04/23/14  Yes Liliane Shi, PA-C  guaiFENesin (DIABETIC TUSSIN EX) 100 MG/5ML liquid Take 200 mg by mouth 3 (three) times daily as needed for cough.   Yes Historical Provider, MD  insulin aspart (NOVOLOG) 100 UNIT/ML injection Inject 5  Units into the skin 3 (three) times daily with meals. Patient taking differently: Inject 14 Units into the skin 3 (three) times daily with meals.  11/19/15  Yes Ripudeep Krystal Eaton, MD  insulin aspart (NOVOLOG) 100 UNIT/ML injection Inject 0-9 Units into the skin 3 (three) times daily with meals. Correction factor Sliding scale CBG 70 - 120: 0 units CBG 121 - 150: 1 unit,  CBG 151 - 200: 2 units,  CBG 201 - 250: 3 units,  CBG 251 - 300: 5 units,  CBG 301 - 350: 7 units,  CBG 351 - 400: 9 units   CBG > 400: 9 units and notify your MD 11/19/15  Yes Ripudeep Krystal Eaton, MD  insulin NPH Human (HUMULIN N,NOVOLIN N) 100 UNIT/ML injection Inject 20 Units into the skin 2 (two) times daily before a meal.  04/04/16  Yes Historical Provider,  MD  metolazone (ZAROXOLYN) 2.5 MG tablet Take 1 tablet only if weight is greater or equal to 300 lbs as needed Patient taking differently: Take 2.5 mg by mouth daily as needed. Take 1 tablet only if weight is greater or equal to 300 lbs as needed 07/13/15  Yes Jolaine Artist, MD  metoprolol (LOPRESSOR) 50 MG tablet Take 1 tablet (50 mg total) by mouth 2 (two) times daily. 03/12/14  Yes Brooke O Edmisten, PA-C  Multiple Vitamins-Iron (MULTIVITAMIN/IRON PO) Take 1 tablet by mouth daily.   Yes Historical Provider, MD  omeprazole (PRILOSEC) 20 MG capsule Take 20 mg by mouth daily.   Yes Historical Provider, MD  OXYGEN Inhale 2 L into the lungs continuous.   Yes Historical Provider, MD  potassium chloride SA (K-DUR,KLOR-CON) 20 MEQ tablet Take 20 mEq by mouth 3 (three) times daily. MAKE SURE TO TAKE EXTRA 20 MEQ ON METOLAZONE DAYS 04/23/14  Yes Liliane Shi, PA-C  tiotropium (SPIRIVA) 18 MCG inhalation capsule Place 1 capsule (18 mcg total) into inhaler and inhale daily. 05/18/15  Yes Collene Gobble, MD  vitamin B-12 (CYANOCOBALAMIN) 500 MCG tablet Take 500 mcg by mouth daily.   Yes Historical Provider, MD    Family History Family History  Problem Relation Age of Onset  . Breast cancer  Sister   . Heart disease    . Hodgkin's lymphoma    . Asthma Mother     Social History Social History  Substance Use Topics  . Smoking status: Former Smoker    Packs/day: 3.00    Years: 45.00    Types: Cigarettes    Start date: 11/08/1963    Quit date: 12/28/2008  . Smokeless tobacco: Never Used     Comment: quit smoking 5 years ago  . Alcohol use 0.0 oz/week     Comment: 01/14/2014 "used to drink socially in the past; last drink was probably 10 yr ago"     Allergies   Ace inhibitors and Clindamycin   Review of Systems Review of Systems All other systems negative unless otherwise stated in HPI   Physical Exam Updated Vital Signs BP 127/70   Pulse 66   Temp 97.9 F (36.6 C) (Oral)   Resp 18   SpO2 91%   Physical Exam  Constitutional: She is oriented to person, place, and time. She appears well-developed and well-nourished.  Non-toxic appearance. She does not have a sickly appearance. She does not appear ill.  HENT:  Head: Normocephalic and atraumatic.  Mouth/Throat: Oropharynx is clear and moist.  Eyes: Conjunctivae are normal.  Neck: Normal range of motion. Neck supple.  Cardiovascular: Normal rate and regular rhythm.   Pulses:      Dorsalis pedis pulses are 2+ on the right side, and 2+ on the left side.  Lower extremities symmetric with minimal pitting edema.   Pulmonary/Chest: Effort normal. No accessory muscle usage or stridor. No respiratory distress. She has decreased breath sounds. She has wheezes. She has no rhonchi. She has no rales.  Diminished breath sounds throughout.   Abdominal: Soft. Bowel sounds are normal. She exhibits no distension. There is no tenderness.  Musculoskeletal: Normal range of motion.  Lymphadenopathy:    She has no cervical adenopathy.  Neurological: She is alert and oriented to person, place, and time.  Speech clear without dysarthria.  Skin: Skin is warm and dry.  Psychiatric: She has a normal mood and affect. Her behavior is  normal.     ED Treatments / Results  Labs (all labs ordered are listed, but only abnormal results are displayed) Labs Reviewed  CBC WITH DIFFERENTIAL/PLATELET - Abnormal; Notable for the following:       Result Value   WBC 10.7 (*)    RBC 3.74 (*)    Hemoglobin 11.0 (*)    HCT 35.2 (*)    RDW 17.5 (*)    Neutro Abs 9.6 (*)    Lymphs Abs 0.6 (*)    All other components within normal limits  COMPREHENSIVE METABOLIC PANEL - Abnormal; Notable for the following:    Chloride 96 (*)    BUN 31 (*)    Creatinine, Ser 1.53 (*)    GFR calc non Af Amer 33 (*)    GFR calc Af Amer 38 (*)    All other components within normal limits  URINALYSIS, ROUTINE W REFLEX MICROSCOPIC (NOT AT [facility]) - Abnormal; Notable for the following:    Hgb urine dipstick MODERATE (*)    Nitrite POSITIVE (*)    Leukocytes, UA MODERATE (*)    All other components within normal limits  BRAIN NATRIURETIC PEPTIDE - Abnormal; Notable for the following:    B Natriuretic Peptide 284.1 (*)    All other components within normal limits  URINE MICROSCOPIC-ADD ON - Abnormal; Notable for the following:    Squamous Epithelial / LPF 0-5 (*)    Bacteria, UA MANY (*)    Casts HYALINE CASTS (*)    All other components within normal limits  URINE CULTURE  LACTIC ACID, PLASMA  I-STAT TROPOININ, ED    EKG  EKG Interpretation  Date/Time:  Tuesday October 17 2016 14:04:56 EST Ventricular Rate:  72 PR Interval:    QRS Duration: 104 QT Interval:  453 QTC Calculation: 496 R Axis:   50 Text Interpretation:  Sinus rhythm Low voltage, precordial leads Abnormal R-wave progression, early transition Borderline prolonged QT interval agree. no STEMI Confirmed by Johnney Killian, MD, Jeannie Done [phone]) on 10/17/2016 6:23:25 PM       Radiology Dg Chest 2 View  Result Date: 10/17/2016 CLINICAL DATA:  COPD, CHF, worsening weakness and shortness of breath with nausea for 1 week EXAM: CHEST  2 VIEW COMPARISON:  09/02/2015, 09/12/2016 FINDINGS:  Cardiomegaly evident with increased vascular and interstitial prominence suspicious for early edema pattern/ CHF. Minor basilar atelectasis. No large effusion. Negative for pneumothorax. Trachea is midline. Aortic atherosclerosis noted. Known 10 mm right lower lobe nodule again demonstrated, without significant change. IMPRESSION: Cardiomegaly with mild interstitial edema pattern. Aortic atherosclerosis 10 mm right lower lobe pulmonary nodule as previously described. Electronically Signed   By: Jerilynn Mages.  Shick M.D.   On: 10/17/2016 14:58    Procedures Procedures (including critical care time)  Medications Ordered in ED Medications  acetaminophen (TYLENOL) tablet 650 mg (650 mg Oral Given 10/17/16 1605)  ipratropium-albuterol (DUONEB) 0.5-2.5 (3) MG/3ML nebulizer solution 3 mL (3 mLs Nebulization Given 10/17/16 1605)  cefTRIAXone (ROCEPHIN) 1 g in dextrose 5 % 50 mL IVPB (0 g Intravenous Stopped 10/17/16 1755)     Initial Impression / Assessment and Plan / ED Course  I have reviewed the triage vital signs and the nursing notes.  Pertinent labs & imaging results that were available during my care of the patient were reviewed by me and considered in my medical decision making (see chart for details).  Clinical Course    Patient presents with findings consistent with UTI without signs of sepsis along with CHF exacerbation. Patient wears 2 L nasal cannula at home, but has  been having increased dyspnea on exertion stating that her oxygen saturations dropped into the 80s at home. While in ED she maintains oxygen saturations in the low 90s. EKG without ischemia and troponin negative, do not suspect ACS.  On arrival, she had a temperature of 100.1. Otherwise, her vitals were reassuring. Her temperature resolved with Tylenol. She does not have any flank pain or low back pain to suggest pyelonephritis. Abdomen soft and benign, low suspicion for acute abdomen. Urine culture has been sent. She was started on 1 g  of Rocephin. She was also given 40 mg IV Lasix. Appreciate TRH for admission and further management.   Case has been discussed with and seen by Dr. Johnney Killian who agrees with the above plan for admission.  Final Clinical Impressions(s) / ED Diagnoses   Final diagnoses:  Urinary tract infection with hematuria, site unspecified  Acute on chronic congestive heart failure, unspecified congestive heart failure type [facility])    New Prescriptions New Prescriptions   No medications on file     Gloriann Loan, PA-C 10/17/16 1942    Charlesetta Shanks, MD 10/19/16 1400

## 2016-10-18 ENCOUNTER — Observation Stay (HOSPITAL_COMMUNITY): Payer: Medicare Other

## 2016-10-18 DIAGNOSIS — M545 Low back pain: Secondary | ICD-10-CM | POA: Diagnosis present

## 2016-10-18 DIAGNOSIS — G8929 Other chronic pain: Secondary | ICD-10-CM | POA: Diagnosis present

## 2016-10-18 DIAGNOSIS — I509 Heart failure, unspecified: Secondary | ICD-10-CM | POA: Diagnosis not present

## 2016-10-18 DIAGNOSIS — I130 Hypertensive heart and chronic kidney disease with heart failure and stage 1 through stage 4 chronic kidney disease, or unspecified chronic kidney disease: Secondary | ICD-10-CM | POA: Diagnosis present

## 2016-10-18 DIAGNOSIS — N10 Acute tubulo-interstitial nephritis: Secondary | ICD-10-CM

## 2016-10-18 DIAGNOSIS — D509 Iron deficiency anemia, unspecified: Secondary | ICD-10-CM | POA: Diagnosis present

## 2016-10-18 DIAGNOSIS — Z888 Allergy status to other drugs, medicaments and biological substances status: Secondary | ICD-10-CM | POA: Diagnosis not present

## 2016-10-18 DIAGNOSIS — E669 Obesity, unspecified: Secondary | ICD-10-CM | POA: Diagnosis present

## 2016-10-18 DIAGNOSIS — Z7901 Long term (current) use of anticoagulants: Secondary | ICD-10-CM | POA: Diagnosis not present

## 2016-10-18 DIAGNOSIS — I480 Paroxysmal atrial fibrillation: Secondary | ICD-10-CM | POA: Diagnosis present

## 2016-10-18 DIAGNOSIS — N183 Chronic kidney disease, stage 3 (moderate): Secondary | ICD-10-CM | POA: Diagnosis present

## 2016-10-18 DIAGNOSIS — E1122 Type 2 diabetes mellitus with diabetic chronic kidney disease: Secondary | ICD-10-CM | POA: Diagnosis present

## 2016-10-18 DIAGNOSIS — Z79899 Other long term (current) drug therapy: Secondary | ICD-10-CM | POA: Diagnosis not present

## 2016-10-18 DIAGNOSIS — Z794 Long term (current) use of insulin: Secondary | ICD-10-CM | POA: Diagnosis not present

## 2016-10-18 DIAGNOSIS — J449 Chronic obstructive pulmonary disease, unspecified: Secondary | ICD-10-CM | POA: Diagnosis present

## 2016-10-18 DIAGNOSIS — K219 Gastro-esophageal reflux disease without esophagitis: Secondary | ICD-10-CM | POA: Diagnosis present

## 2016-10-18 DIAGNOSIS — N390 Urinary tract infection, site not specified: Secondary | ICD-10-CM | POA: Diagnosis present

## 2016-10-18 DIAGNOSIS — R300 Dysuria: Secondary | ICD-10-CM | POA: Diagnosis not present

## 2016-10-18 DIAGNOSIS — E785 Hyperlipidemia, unspecified: Secondary | ICD-10-CM | POA: Diagnosis present

## 2016-10-18 DIAGNOSIS — I4891 Unspecified atrial fibrillation: Secondary | ICD-10-CM | POA: Diagnosis not present

## 2016-10-18 DIAGNOSIS — F329 Major depressive disorder, single episode, unspecified: Secondary | ICD-10-CM | POA: Diagnosis present

## 2016-10-18 DIAGNOSIS — I5033 Acute on chronic diastolic (congestive) heart failure: Secondary | ICD-10-CM | POA: Diagnosis present

## 2016-10-18 DIAGNOSIS — J441 Chronic obstructive pulmonary disease with (acute) exacerbation: Secondary | ICD-10-CM | POA: Diagnosis not present

## 2016-10-18 DIAGNOSIS — Z87891 Personal history of nicotine dependence: Secondary | ICD-10-CM | POA: Diagnosis not present

## 2016-10-18 DIAGNOSIS — Z6841 Body Mass Index (BMI) 40.0 and over, adult: Secondary | ICD-10-CM | POA: Diagnosis not present

## 2016-10-18 DIAGNOSIS — Z7952 Long term (current) use of systemic steroids: Secondary | ICD-10-CM | POA: Diagnosis not present

## 2016-10-18 LAB — TROPONIN I
Troponin I: <0.03 ng/mL (ref <0.03)
Troponin I: <0.03 ng/mL (ref <0.03)
Troponin I: <0.03 ng/mL (ref <0.03)

## 2016-10-18 LAB — CBC WITH DIFFERENTIAL/PLATELET
BASOS ABS: 0 10*3/uL (ref 0.0–0.1)
BASOS PCT: 0 %
EOS ABS: 0.1 10*3/uL (ref 0.0–0.7)
Eosinophils Relative: 1 %
HCT: 31.1 % — ABNORMAL LOW (ref 36.0–46.0)
HEMOGLOBIN: 9.4 g/dL — AB (ref 12.0–15.0)
Lymphocytes Relative: 13 %
Lymphs Abs: 0.8 10*3/uL (ref 0.7–4.0)
MCH: 28.4 pg (ref 26.0–34.0)
MCHC: 30.2 g/dL (ref 30.0–36.0)
MCV: 94 fL (ref 78.0–100.0)
Monocytes Absolute: 0.5 10*3/uL (ref 0.1–1.0)
Monocytes Relative: 8 %
NEUTROS ABS: 5 10*3/uL (ref 1.7–7.7)
NEUTROS PCT: 78 %
Platelets: 202 10*3/uL (ref 150–400)
RBC: 3.31 MIL/uL — AB (ref 3.87–5.11)
RDW: 17.5 % — ABNORMAL HIGH (ref 11.5–15.5)
WBC: 6.4 10*3/uL (ref 4.0–10.5)

## 2016-10-18 LAB — GLUCOSE, CAPILLARY
GLUCOSE-CAPILLARY: 217 mg/dL — AB (ref 65–99)
GLUCOSE-CAPILLARY: 163 mg/dL — AB (ref 65–99)
GLUCOSE-CAPILLARY: 242 mg/dL — AB (ref 65–99)
Glucose-Capillary: 195 mg/dL — ABNORMAL HIGH (ref 65–99)
Glucose-Capillary: 235 mg/dL — ABNORMAL HIGH (ref 65–99)

## 2016-10-18 LAB — MRSA PCR SCREENING: MRSA BY PCR: NEGATIVE

## 2016-10-18 LAB — BASIC METABOLIC PANEL
Anion gap: 10 (ref 5–15)
BUN: 29 mg/dL — ABNORMAL HIGH (ref 6–20)
CHLORIDE: 96 mmol/L — AB (ref 101–111)
CO2: 31 mmol/L (ref 22–32)
Calcium: 8.8 mg/dL — ABNORMAL LOW (ref 8.9–10.3)
Creatinine, Ser: 1.58 mg/dL — ABNORMAL HIGH (ref 0.44–1.00)
GFR calc non Af Amer: 32 mL/min — ABNORMAL LOW (ref >60)
GFR, EST AFRICAN AMERICAN: 37 mL/min — AB (ref >60)
Glucose, Bld: 146 mg/dL — ABNORMAL HIGH (ref 65–99)
POTASSIUM: 3.9 mmol/L (ref 3.5–5.1)
SODIUM: 137 mmol/L (ref 135–145)

## 2016-10-18 LAB — ECHOCARDIOGRAM COMPLETE
HEIGHTINCHES: 65 in
WEIGHTICAEL: 4848 [oz_av]

## 2016-10-18 MED ORDER — FUROSEMIDE 10 MG/ML IJ SOLN
40.0000 mg | Freq: Two times a day (BID) | INTRAMUSCULAR | Status: AC
  Administered 2016-10-18 – 2016-10-19 (×2): 40 mg via INTRAVENOUS
  Filled 2016-10-18 (×2): qty 4

## 2016-10-18 NOTE — Progress Notes (Signed)
[facility] PROGRESS NOTE  Paige Hardin [id number] DOB: 1946-11-08 DOA: 10/17/2016   PCP: Stephens Shire, MD     Assessment/Plan: Principal Problem:   UTI (urinary tract infection) Active Problems:   COPD with emphysema    HTN (hypertension)   Chronic anticoagulation   Chronic renal disease, stage III   Atrial fibrillation with RVR- DCCV 03/27/14   Iron deficiency anemia   Hyperlipidemia   CHF exacerbation ([place])   70 y.o. female from [place] with PMH significant for COPD, PAF on Eliquis, and CHF on 2L [place] home oxygen who presents with fever over the last couple of days with max temp of 101.1 with associated hematuria x 2 days. Patient found to have UTI.  Assessment and plan Urinary tract infection with hematuria: Acute. Patient presents for hematuria and fever. UA positive for possible source of infection. Patient given Rocephin IV in the ED.  Continue telemetry - Follow-up urine culture - Continue Rocephin IV Follow blood culture, urine culture  Leukocytosis: WBC elevated at 10.7. Improving, and Suspect that this is secondary to patient's acute infection as noted. - recheck CBC in am     Acute on chronic diastolic heart failure. Last echocardiogram in 03/2015 showing EF of 55-60% with no wall motion abnormalities. Patient presents with a moderately elevated BNP of 284.1. Chest x-ray showing cardiomegaly with mild interstitial edema. Continue 40 mg of Lasix IV to wean oxygen to baseline use - Strict I&O's and Daily weights - Check echocardiogram - Lasix 40 mg IV every 12 hours    COPD: without acute exacerbation. Patient with acute onset of wheezing shortness of breath, coughing, and increased oxygen requirements. Suspect this could be acute COPD exacerbation versus bronchitis. Patient given 60 mg of by mouth prednisone in the emergency department. - Albuterol scheduled every 6 hours and when necessary every 2 hours - Continue  medications of  Spiriva - Changed Symbicort inhaler to Pulmicort and Brovana nebs  - Reassess patient condition to see systemic steroids needed  PAF (paroxysmal atrial fibrillation) s/p multiple DCCVs; on Amiodarone: Rate Controlled on amiodarone. Patient has a chadsvasc score of 5 and is on chronic anticoagulation of Eliquis. - Continue Eliquis, amiodarone, diltiazem  Essential Hypertension: Stable - Continue metoprolol, all other medications seen above  Diabetes mellitus type 2: Patient found to have a blood glucose on admission of 499 no anion gap. Patient's last hemoglobin A1c was noted to be 8 back in August 2016. - Hypoglycemic protocols - CBGs every before meals and at bedtime with moderate sliding scale insulin   Chronic renal disease, stage III: Stable. Creatinine appears to be at baseline around 1.5  - Continue to monitor    Iron deficiency anemia: Hemoglobin at 11g/dL and this appear near patient's last admission. Hemoglobin now 9.4 - Continue Ferrous Sulfate  Hyperlipidemia - Continue Vytorin  Morbid obesity- BMI 50  GERD - Pharmacy substitution of Protonix or omeprazole      DVT prophylaxsis  eliquis   Code Status:   DO NOT RESUSCITATE    Family Communication: Discussed in detail with the patient, all imaging results, lab results explained to the patient   Disposition Plan:  Anticipate discharge in one to 2 days      Consultants:  None  Procedures:  None  Antibiotics: Anti-infectives    Start     Dose/Rate Route Frequency Ordered Stop   10/18/16 1700  cefTRIAXone (ROCEPHIN) 1 g in dextrose 5 % 50 mL  IVPB     1 g 100 mL/hr over 30 Minutes Intravenous Every 24 hours 10/17/16 2027     10/17/16 1700  cefTRIAXone (ROCEPHIN) 1 g in dextrose 5 % 50 mL IVPB     1 g 100 mL/hr over 30 Minutes Intravenous  Once 10/17/16 1654 10/17/16 1755         HPI/Subjective:  Patient states she feels a lot better, less short of breath, afebrile  overnight  Objective: Vitals:   10/17/16 1958 10/17/16 2011 10/18/16 0020 10/18/16 0507  BP:  (!) 140/56 (!) 121/47 140/60  Pulse:  63 67 61  Resp:  17 18 18   Temp:  98 F (36.7 C) 98.2 F (36.8 C) 98.5 F (36.9 C)  TempSrc:  Oral Oral Oral  SpO2:  94% 95% 93%  Weight: 133.7 kg (294 lb 11.2 oz)   (!) 137.4 kg (303 lb)  Height: 5\' 5"  (1.651 m)       Intake/Output Summary (Last 24 hours) at 10/18/16 0840 Last data filed at 10/18/16 0746  Gross per 24 hour  Intake              363 ml  Output             1125 ml  Net             -762 ml    Exam:  Examination:  General exam: Appears calm and comfortable  Respiratory system: Clear to auscultation. Respiratory effort normal. Cardiovascular system: S1 & S2 heard, RRR. No JVD, murmurs, rubs, gallops or clicks. No pedal edema. Gastrointestinal system: Abdomen is nondistended, soft and nontender. No organomegaly or masses felt. Normal bowel sounds heard. Central nervous system: Alert and oriented. No focal neurological deficits. Extremities: Symmetric 5 x 5 power. Skin: No rashes, lesions or ulcers Psychiatry: Judgement and insight appear normal. Mood & affect appropriate.     Data Reviewed: I have personally reviewed following labs and imaging studies  Micro Results Recent Results (from the past 240 hour(s))  MRSA PCR Screening     Status: None   Collection Time: 10/18/16 12:15 AM  Result Value Ref Range Status   MRSA by PCR NEGATIVE NEGATIVE Final    Comment:        The GeneXpert MRSA Assay (FDA approved for NASAL specimens only), is one component of a comprehensive MRSA colonization surveillance program. It is not intended to diagnose MRSA infection nor to guide or monitor treatment for MRSA infections.     Radiology Reports Dg Chest 2 View  Result Date: 10/17/2016 CLINICAL DATA:  COPD, CHF, worsening weakness and shortness of breath with nausea for 1 week EXAM: CHEST  2 VIEW COMPARISON:  09/02/2015,  09/12/2016 FINDINGS: Cardiomegaly evident with increased vascular and interstitial prominence suspicious for early edema pattern/ CHF. Minor basilar atelectasis. No large effusion. Negative for pneumothorax. Trachea is midline. Aortic atherosclerosis noted. Known 10 mm right lower lobe nodule again demonstrated, without significant change. IMPRESSION: Cardiomegaly with mild interstitial edema pattern. Aortic atherosclerosis 10 mm right lower lobe pulmonary nodule as previously described. Electronically Signed   By: Jerilynn Mages.  Shick M.D.   On: 10/17/2016 14:58     CBC  Recent Labs Lab 10/17/16 1419 10/18/16 0331  WBC 10.7* 6.4  HGB 11.0* 9.4*  HCT 35.2* 31.1*  PLT 231 202  MCV 94.1 94.0  MCH 29.4 28.4  MCHC 31.3 30.2  RDW 17.5* 17.5*  LYMPHSABS 0.6* 0.8  MONOABS 0.5 0.5  EOSABS 0.0 0.1  BASOSABS 0.0  0.0    Chemistries   Recent Labs Lab 10/17/16 1419 10/18/16 0331  NA 136 137  K 4.7 3.9  CL 96* 96*  CO2 30 31  GLUCOSE 76 146*  BUN 31* 29*  CREATININE 1.53* 1.58*  CALCIUM 9.1 8.8*  AST 31  --   ALT 16  --   ALKPHOS 58  --   BILITOT 0.8  --    ------------------------------------------------------------------------------------------------------------------ estimated creatinine clearance is 46 mL/min (by C-G formula based on SCr of 1.58 mg/dL (H)). ------------------------------------------------------------------------------------------------------------------ No results for input(s): HGBA1C in the last 72 hours. ------------------------------------------------------------------------------------------------------------------ No results for input(s): CHOL, HDL, LDLCALC, TRIG, CHOLHDL, LDLDIRECT in the last 72 hours. ------------------------------------------------------------------------------------------------------------------ No results for input(s): TSH, T4TOTAL, T3FREE, THYROIDAB in the last 72 hours.  Invalid input(s):  FREET3 ------------------------------------------------------------------------------------------------------------------ No results for input(s): VITAMINB12, FOLATE, FERRITIN, TIBC, IRON, RETICCTPCT in the last 72 hours.  Coagulation profile No results for input(s): INR, PROTIME in the last 168 hours.  No results for input(s): DDIMER in the last 72 hours.  Cardiac Enzymes  Recent Labs Lab 10/17/16 2142 10/18/16 0331  TROPONINI <0.03 <0.03   ------------------------------------------------------------------------------------------------------------------ Invalid input(s): POCBNP   CBG:  Recent Labs Lab 10/17/16 2057 10/18/16 0602  GLUCAP 150* 195*       Studies: Dg Chest 2 View  Result Date: 10/17/2016 CLINICAL DATA:  COPD, CHF, worsening weakness and shortness of breath with nausea for 1 week EXAM: CHEST  2 VIEW COMPARISON:  09/02/2015, 09/12/2016 FINDINGS: Cardiomegaly evident with increased vascular and interstitial prominence suspicious for early edema pattern/ CHF. Minor basilar atelectasis. No large effusion. Negative for pneumothorax. Trachea is midline. Aortic atherosclerosis noted. Known 10 mm right lower lobe nodule again demonstrated, without significant change. IMPRESSION: Cardiomegaly with mild interstitial edema pattern. Aortic atherosclerosis 10 mm right lower lobe pulmonary nodule as previously described. Electronically Signed   By: Jerilynn Mages.  Shick M.D.   On: 10/17/2016 14:58      Lab Results  Component Value Date   HGBA1C 9.7 (H) 11/16/2015   HGBA1C 8.0 (H) 07/13/2015   HGBA1C 6.7 (H) 01/27/2014   Lab Results  Component Value Date   LDLCALC 63 04/13/2015   CREATININE 1.58 (H) 10/18/2016       Scheduled Meds: . allopurinol  100 mg Oral Daily  . amiodarone  200 mg Oral Daily  . apixaban  5 mg Oral BID  . arformoterol  15 mcg Nebulization BID  . budesonide (PULMICORT) nebulizer solution  0.5 mg Nebulization BID  . calcium-vitamin D  1 tablet Oral  Q breakfast  . cefTRIAXone (ROCEPHIN)  IV  1 g Intravenous Q24H  . cholecalciferol  2,000 Units Oral Daily  . diltiazem  180 mg Oral Daily  . ezetimibe-simvastatin  1 tablet Oral QHS  . ferrous sulfate  325 mg Oral BID WC  . furosemide  40 mg Intravenous Q12H  . insulin aspart  0-15 Units Subcutaneous TID WC  . insulin aspart  0-5 Units Subcutaneous QHS  . metoprolol  50 mg Oral BID  . pantoprazole  40 mg Oral Daily  . potassium chloride SA  20 mEq Oral TID  . sodium chloride flush  3 mL Intravenous Q12H  . tiotropium  18 mcg Inhalation Daily  . vitamin C  500 mg Oral Daily   Continuous Infusions:   LOS: 0 days    Time spent: >30 MINS    [facility]  [facility] Pager [phone]. If 7PM-7AM, please contact night-coverage at [email or web address], password [facility] 10/18/2016, 8:40 AM  LOS:  0 days

## 2016-10-18 NOTE — Consult Note (Signed)
   [facility] Inpatient Consult   10/18/2016  MEEGHAN HELT 1946/07/05 [id number]   Patient screened for potential [place] [facility] services for HF exacerbation and COPD. Patient is eligible for [facility] Care Management services under patient's Medicare/ACO Registry plan. Nurse states patient has nursing and aide at facility [facility] term at [facility]..  Admitted with shortness of breath. No needs identified as discussed with inpatient RNCM/progression meeting. For questions contact:   Natividad Brood, RN BSN [facility] Liaison  [phone] business mobile phone Toll free office [phone]

## 2016-10-18 NOTE — Progress Notes (Signed)
Very pleasant uneventful day with. Current Iv lasix therapy ECHO performed and tolerated welcome. Pending Results.

## 2016-10-18 NOTE — Progress Notes (Signed)
  Echocardiogram 2D Echocardiogram has been performed.  Donata Clay 10/18/2016, 12:06 PM

## 2016-10-18 NOTE — Clinical Social Work Note (Signed)
FL2 signed by MD. CSW faxed to [facility]. Per admissions coordinator, Lannette Donath, if patient discharging tomorrow she will have to be back to facility by 11:00 am at the latest because the RN will leave after that due to the Thanksgiving holiday. CSW paged MD to make her aware.  Dayton Scrape, [place]

## 2016-10-18 NOTE — Clinical Social Work Note (Signed)
Clinical Social Work Assessment  Patient Details  Name: Paige Hardin MRN: [id number] Date of Birth: Jan 07, 1946  Date of referral:  10/18/16               Reason for consult:  Discharge Planning                Permission sought to share information with:  Facility Sport and exercise psychologist, Family Supports Permission granted to share information::  Yes, Verbal Permission Granted  Name::     [place]::  [facility]  Relationship::  Husband  Contact Information:  [phone]  Housing/Transportation Living arrangements for the past 2 months:  [place] of Information:  Patient, Medical Team Patient Interpreter Needed:  None Criminal Activity/Legal Involvement Pertinent to Current Situation/Hospitalization:  No - Comment as needed Significant Relationships:  Spouse, Other Family Members Lives with:  Facility Resident Do you feel safe going back to the place where you live?  Yes Need for family participation in patient care:  Yes (Comment)  Care giving concerns:  Patient is from [facility].   Social Worker assessment / plan:  CSW met with patient. No supports at bedside. CSW introduced role and explained that discharge planning would be discussed. Patient confirmed that she is from [facility] and plans to return once discharged. Patient will need PTAR. No further concerns. CSW encouraged patient to contact CSW as needed. CSW will continue to follow patient for support and facilitate discharge back to ALF once medically stable.  Employment status:  Retired Forensic scientist:  Medicare PT Recommendations:  Not assessed at this time [place] / Referral to community resources:  Other (Comment Required) (Plans to return to ALF.)  Patient/Family's Response to care:  Patient agreeable to return to ALF. Patient's family supportive and involved in patient's care. Patient appreciated social work intervention.  Patient/Family's  Understanding of and Emotional Response to Diagnosis, Current Treatment, and Prognosis:  Patient understands and is agreeable to discharge plan. Patient appears happy with hospital care.  Emotional Assessment Appearance:  Appears stated age Attitude/Demeanor/Rapport:  Other (Pleasant) Affect (typically observed):  Accepting, Appropriate, Calm, Pleasant Orientation:  Oriented to Self, Oriented to Place, Oriented to  Time, Oriented to Situation Alcohol / Substance use:  Never Used Psych involvement (Current and /or in the community):  No (Comment)  Discharge Needs  Concerns to be addressed:  Care Coordination Readmission within the last 30 days:  No Current discharge risk:  None Barriers to Discharge:  No Barriers Identified   Candie Chroman, LCSW 10/18/2016, 11:02 AM

## 2016-10-18 NOTE — NC FL2 (Signed)
[place] LEVEL OF CARE SCREENING TOOL     IDENTIFICATION  Patient Name: Paige Hardin Birthdate: 1946-09-15 Sex: female Admission Date (Current Location): 10/17/2016  [facility] and [place] Number:  Herbalist and Address:  The . [facility], [place] [address] [place], [place] [address]      Provider Number: [id number]  Attending Physician Name and Address:  Reyne Dumas, MD  Relative Name and Phone Number:       Current Level of Care: Hospital Recommended Level of Care: [place] Prior Approval Number:    Date Approved/Denied:   PASRR Number:    Discharge Plan: Other (Comment) (ALF)    Current Diagnoses: Patient Active Problem List   Diagnosis Date Noted  . CHF exacerbation ([place]) 10/17/2016  . CHF (congestive heart failure) ([place]) 10/17/2016  . UTI (urinary tract infection) 10/17/2016  . Solitary pulmonary nodule 12/23/2015  . Type 2 diabetes mellitus with hyperglycemia ([place]) 11/16/2015  . Hyperlipidemia 11/16/2015  . Excessive daytime sleepiness 09/08/2015  . Iron deficiency anemia 03/29/2014  . Atrial fibrillation with RVR- DCCV 03/27/14 03/25/2014  . Altered mental status 02/24/2014  . HTN (hypertension) 01/21/2014  . Chronic anticoagulation 01/21/2014  . Pulmonary HTN- pa 53 mmHg Echo 01/15/14 01/21/2014  . Morbid obesity- BMI 50 01/21/2014  . Poorly controlled diabetes mellitus ([place]) 01/21/2014  . Chronic renal disease, stage III 01/21/2014  . Chronic diastolic heart failure ([place]) 01/14/2014  . CHEST PAIN 09/26/2010  . HYPERLIPIDEMIA-MIXED 05/14/2009  . HOARSENESS 05/14/2009  . CAROTID BRUIT 05/14/2009  . COPD with emphysema  05/05/2009  . PAF (paroxysmal atrial fibrillation) s/p multiple DCCVs; on Amiodarone 05/05/2009    Orientation RESPIRATION BLADDER Height & Weight     Self, Time, Situation, Place  O2 (Nasal Canula 2.5 L) Continent Weight: (!) 303 lb (137.4 kg) (admission weight off; pt states this  sounds correct) Height:  5\' 5"  (165.1 cm)  BEHAVIORAL SYMPTOMS/MOOD NEUROLOGICAL BOWEL NUTRITION STATUS   (None)  (None) Continent Diet (Heart healthy/carb modified)  AMBULATORY STATUS COMMUNICATION OF NEEDS Skin   Independent Verbally Normal                       Personal Care Assistance Level of Assistance              Functional Limitations Info  Sight, Hearing, Speech Sight Info: Adequate Hearing Info: Adequate Speech Info: Adequate    SPECIAL CARE FACTORS FREQUENCY  Blood pressure, Diabetic urine testing                    Contractures Contractures Info: Not present    Additional Factors Info  Code Status, Allergies Code Status Info: DNR Allergies Info: Ace Inhibitors, Clindamycin           Current Medications (10/18/2016):  This is the current hospital active medication list Current Facility-Administered Medications  Medication Dose Route Frequency Provider Last Rate Last Dose  . 0.9 %  sodium chloride infusion  250 mL Intravenous PRN Norval Morton, MD      . acetaminophen (TYLENOL) tablet 650 mg  650 mg Oral Q4H PRN Rondell A Tamala Julian, MD      . albuterol (PROVENTIL) (2.5 MG/3ML) 0.083% nebulizer solution 2.5 mg  2.5 mg Nebulization Q4H PRN Norval Morton, MD      . allopurinol (ZYLOPRIM) tablet 100 mg  100 mg Oral Daily Norval Morton, MD   100 mg at 10/18/16 1008  .  amiodarone (PACERONE) tablet 200 mg  200 mg Oral Daily Norval Morton, MD   200 mg at 10/18/16 1008  . apixaban (ELIQUIS) tablet 5 mg  5 mg Oral BID Norval Morton, MD   5 mg at 10/18/16 1008  . arformoterol (BROVANA) nebulizer solution 15 mcg  15 mcg Nebulization BID Norval Morton, MD   15 mcg at 10/18/16 0932  . benzonatate (TESSALON) capsule 200 mg  200 mg Oral BID PRN Norval Morton, MD      . budesonide (PULMICORT) nebulizer solution 0.5 mg  0.5 mg Nebulization BID Norval Morton, MD   0.5 mg at 10/18/16 0932  . calcium-vitamin D (OSCAL WITH D) 500-200 MG-UNIT per tablet 1  tablet  1 tablet Oral Q breakfast Norval Morton, MD   1 tablet at 10/18/16 0745  . cefTRIAXone (ROCEPHIN) 1 g in dextrose 5 % 50 mL IVPB  1 g Intravenous Q24H Kimberly B Hammons, RPH      . cholecalciferol (VITAMIN D) tablet 2,000 Units  2,000 Units Oral Daily Norval Morton, MD   2,000 Units at 10/18/16 1008  . diltiazem (CARDIZEM CD) 24 hr capsule 180 mg  180 mg Oral Daily Norval Morton, MD   180 mg at 10/18/16 1008  . ezetimibe-simvastatin (VYTORIN) 10-40 MG per tablet 1 tablet  1 tablet Oral QHS Norval Morton, MD   1 tablet at 10/17/16 2337  . ferrous sulfate tablet 325 mg  325 mg Oral BID WC Rondell Charmayne Sheer, MD   325 mg at 10/18/16 0745  . furosemide (LASIX) injection 40 mg  40 mg Intravenous Q12H Reyne Dumas, MD      . guaiFENesin (ROBITUSSIN) 100 MG/5ML solution 200 mg  200 mg Oral TID PRN Norval Morton, MD      . insulin aspart (novoLOG) injection 0-15 Units  0-15 Units Subcutaneous TID WC Norval Morton, MD   3 Units at 10/18/16 0640  . insulin aspart (novoLOG) injection 0-5 Units  0-5 Units Subcutaneous QHS Rondell A Smith, MD      . metolazone (ZAROXOLYN) tablet 2.5 mg  2.5 mg Oral Daily PRN Norval Morton, MD      . metoprolol tartrate (LOPRESSOR) tablet 50 mg  50 mg Oral BID Norval Morton, MD   50 mg at 10/18/16 1008  . ondansetron (ZOFRAN) injection 4 mg  4 mg Intravenous Q6H PRN Norval Morton, MD      . pantoprazole (PROTONIX) EC tablet 40 mg  40 mg Oral Daily Norval Morton, MD   40 mg at 10/18/16 1008  . potassium chloride SA (K-DUR,KLOR-CON) CR tablet 20 mEq  20 mEq Oral TID Norval Morton, MD   20 mEq at 10/18/16 1008  . sodium chloride flush (NS) 0.9 % injection 3 mL  3 mL Intravenous Q12H Norval Morton, MD   3 mL at 10/17/16 2337  . sodium chloride flush (NS) 0.9 % injection 3 mL  3 mL Intravenous PRN Norval Morton, MD      . tiotropium (SPIRIVA) inhalation capsule 18 mcg  18 mcg Inhalation Daily Norval Morton, MD   18 mcg at 10/18/16 0932  . vitamin C  (ASCORBIC ACID) tablet 500 mg  500 mg Oral Daily Norval Morton, MD   500 mg at 10/18/16 1008     Discharge Medications: Please see discharge summary for a list of discharge medications.  Relevant Imaging Results:  Relevant Lab Results:  Additional Information SS#: [id number]  Candie Chroman, LCSW

## 2016-10-19 DIAGNOSIS — N3000 Acute cystitis without hematuria: Secondary | ICD-10-CM

## 2016-10-19 DIAGNOSIS — I509 Heart failure, unspecified: Secondary | ICD-10-CM

## 2016-10-19 LAB — CBC
HCT: 31.8 % — ABNORMAL LOW (ref 36.0–46.0)
Hemoglobin: 9.7 g/dL — ABNORMAL LOW (ref 12.0–15.0)
MCH: 28.5 pg (ref 26.0–34.0)
MCHC: 30.5 g/dL (ref 30.0–36.0)
MCV: 93.5 fL (ref 78.0–100.0)
PLATELETS: 212 10*3/uL (ref 150–400)
RBC: 3.4 MIL/uL — AB (ref 3.87–5.11)
RDW: 17.4 % — ABNORMAL HIGH (ref 11.5–15.5)
WBC: 7.3 10*3/uL (ref 4.0–10.5)

## 2016-10-19 LAB — GLUCOSE, CAPILLARY
GLUCOSE-CAPILLARY: 223 mg/dL — AB (ref 65–99)
GLUCOSE-CAPILLARY: 259 mg/dL — AB (ref 65–99)
GLUCOSE-CAPILLARY: 232 mg/dL — AB (ref 65–99)
GLUCOSE-CAPILLARY: 253 mg/dL — AB (ref 65–99)

## 2016-10-19 LAB — COMPREHENSIVE METABOLIC PANEL
ALBUMIN: 3.1 g/dL — AB (ref 3.5–5.0)
ALK PHOS: 54 U/L (ref 38–126)
ALT: 17 U/L (ref 14–54)
AST: 22 U/L (ref 15–41)
Anion gap: 8 (ref 5–15)
BUN: 40 mg/dL — AB (ref 6–20)
CALCIUM: 9 mg/dL (ref 8.9–10.3)
CHLORIDE: 98 mmol/L — AB (ref 101–111)
CO2: 30 mmol/L (ref 22–32)
CREATININE: 1.76 mg/dL — AB (ref 0.44–1.00)
GFR calc Af Amer: 32 mL/min — ABNORMAL LOW (ref >60)
GFR calc non Af Amer: 28 mL/min — ABNORMAL LOW (ref >60)
GLUCOSE: 209 mg/dL — AB (ref 65–99)
Potassium: 4.5 mmol/L (ref 3.5–5.1)
SODIUM: 136 mmol/L (ref 135–145)
Total Bilirubin: 0.6 mg/dL (ref 0.3–1.2)
Total Protein: 6.7 g/dL (ref 6.5–8.1)

## 2016-10-19 LAB — TROPONIN I
Troponin I: <0.03 ng/mL (ref <0.03)
Troponin I: <0.03 ng/mL (ref <0.03)

## 2016-10-19 MED ORDER — CEPHALEXIN 500 MG PO CAPS: 500.0000 mg | ORAL_CAPSULE | Freq: Three times a day (TID) | ORAL | 0 refills | Status: DC

## 2016-10-19 MED ORDER — METOLAZONE 2.5 MG PO TABS: ORAL_TABLET | ORAL | 3 refills | Status: DC

## 2016-10-19 MED ORDER — ARFORMOTEROL TARTRATE 15 MCG/2ML IN NEBU: 15.0000 ug | INHALATION_SOLUTION | Freq: Two times a day (BID) | RESPIRATORY_TRACT | 1 refills | Status: DC

## 2016-10-19 NOTE — Progress Notes (Signed)
Patient woke up and refused bed alarm. Will continue to monitor patient.

## 2016-10-19 NOTE — Discharge Summary (Signed)
Physician Discharge Summary  Paige Hardin MRN: [id number] DOB/AGE: Dec 05, 1945 70 y.o.  PCP: Stephens Shire, MD   Admit date: 10/17/2016 Discharge date: 10/19/2016  Discharge Diagnoses:    Principal Problem:   UTI (urinary tract infection) Active Problems:   COPD with emphysema    HTN (hypertension)   Chronic anticoagulation   Chronic renal disease, stage III   Atrial fibrillation with RVR- DCCV 03/27/14   Iron deficiency anemia   Hyperlipidemia   CHF exacerbation (HCC)   CHF (congestive heart failure) ([place])    Follow-up recommendations Follow-up with PCP in 3-5 days , including all  additional recommended appointments as below Follow-up CBC, CMP in 3-5 days Resume Zaroxolyn as needed for weight   of greater than 300 pounds Please follow renal function closely on a weekly basis Please pursue results of urine culture from 11/21-currently growing greater than 100,000 gram-negative rods History check hemoglobin A1c if recent one not on record Please send a follow-up appointment with Larey Dresser, MD in the next 1-2 weeks      Current Discharge Medication List    START taking these medications   Details  arformoterol (BROVANA) 15 MCG/2ML NEBU Take 2 mLs (15 mcg total) by nebulization 2 (two) times daily. Qty: 120 mL, Refills: 1    cephALEXin (KEFLEX) 500 MG capsule Take 1 capsule (500 mg total) by mouth 3 (three) times daily. Qty: 21 capsule, Refills: 0      CONTINUE these medications which have CHANGED   Details  metolazone (ZAROXOLYN) 2.5 MG tablet Take 1 tablet only if weight is greater or equal to 300 lbs as needed Qty: 30 tablet, Refills: 3   Associated Diagnoses: Chronic diastolic heart failure (HCC)      CONTINUE these medications which have NOT CHANGED   Details  acetaminophen (TYLENOL) 325 MG tablet Take 325 mg by mouth daily as needed for mild pain.     albuterol (PROVENTIL HFA;VENTOLIN HFA) 108 (90 Base) MCG/ACT inhaler Inhale 2 puffs into the lungs  every 6 (six) hours as needed for wheezing or shortness of breath.    albuterol (PROVENTIL) (2.5 MG/3ML) 0.083% nebulizer solution Take 2.5 mg by nebulization every 6 (six) hours as needed for wheezing or shortness of breath.    allopurinol (ZYLOPRIM) 100 MG tablet Take 1 tablet (100 mg total) by mouth daily.    amiodarone (PACERONE) 200 MG tablet Take 1 tablet (200 mg total) by mouth daily.   Associated Diagnoses: PAF (paroxysmal atrial fibrillation) (HCC)    apixaban (ELIQUIS) 5 MG TABS tablet Take 1 tablet (5 mg total) by mouth 2 (two) times daily. Qty: 60 tablet    Ascorbic Acid (VITAMIN C) 500 MG CAPS Take 500 mg by mouth daily.     benzonatate (TESSALON) 200 MG capsule Take 1 capsule (200 mg total) by mouth 2 (two) times daily as needed for cough. Qty: 30 capsule, Refills: 0    budesonide-formoterol (SYMBICORT) 160-4.5 MCG/ACT inhaler Inhale 2 puffs into the lungs 2 (two) times daily. Qty: 1 Inhaler, Refills: 5    calcium-vitamin D (OSCAL WITH D) 500-200 MG-UNIT per tablet Take 1 tablet by mouth daily with breakfast.    Cholecalciferol (VITAMIN D) 2000 UNITS tablet Take 2,000 Units by mouth daily.    diltiazem (CARDIZEM CD) 180 MG 24 hr capsule Take 1 capsule (180 mg total) by mouth daily. Qty: 30 capsule, Refills: 3    ezetimibe-simvastatin (VYTORIN) 10-40 MG per tablet Take 1 tablet by mouth at bedtime.  ferrous sulfate 325 (65 FE) MG tablet Take 325 mg by mouth 2 (two) times daily with a meal.     furosemide (LASIX) 80 MG tablet Take 2 tablets (160 mg total) by mouth 2 (two) times daily. MAKE SURE TO TAKE AT 8 AM AND 2 PM   Associated Diagnoses: Acute on chronic diastolic heart failure (HCC)    guaiFENesin (DIABETIC TUSSIN EX) 100 MG/5ML liquid Take 200 mg by mouth 3 (three) times daily as needed for cough.    !! insulin aspart (NOVOLOG) 100 UNIT/ML injection Inject 5 Units into the skin 3 (three) times daily with meals. Qty: 10 mL, Refills: 11    !! insulin aspart  (NOVOLOG) 100 UNIT/ML injection Inject 0-9 Units into the skin 3 (three) times daily with meals. Correction factor Sliding scale CBG 70 - 120: 0 units CBG 121 - 150: 1 unit,  CBG 151 - 200: 2 units,  CBG 201 - 250: 3 units,  CBG 251 - 300: 5 units,  CBG 301 - 350: 7 units,  CBG 351 - 400: 9 units   CBG > 400: 9 units and notify your MD Qty: 10 mL, Refills: 11    insulin NPH Human (HUMULIN N,NOVOLIN N) 100 UNIT/ML injection Inject 20 Units into the skin 2 (two) times daily before a meal.    Associated Diagnoses: Iron deficiency anemia; Chronic renal disease, stage III; Anemia associated with chronic renal failure, stage 3 (moderate)    metoprolol (LOPRESSOR) 50 MG tablet Take 1 tablet (50 mg total) by mouth 2 (two) times daily. Qty: 60 tablet, Refills: 3    Multiple Vitamins-Iron (MULTIVITAMIN/IRON PO) Take 1 tablet by mouth daily.    omeprazole (PRILOSEC) 20 MG capsule Take 20 mg by mouth daily.    OXYGEN Inhale 2 L into the lungs continuous.    potassium chloride SA (K-DUR,KLOR-CON) 20 MEQ tablet Take 20 mEq by mouth 3 (three) times daily. MAKE SURE TO TAKE EXTRA 20 MEQ ON METOLAZONE DAYS    tiotropium (SPIRIVA) 18 MCG inhalation capsule Place 1 capsule (18 mcg total) into inhaler and inhale daily. Qty: 30 capsule, Refills: 5    vitamin B-12 (CYANOCOBALAMIN) 500 MCG tablet Take 500 mcg by mouth daily.     !! - Potential duplicate medications found. Please discuss with provider.       Discharge Condition: Stable   Discharge Instructions Get Medicines reviewed and adjusted: Please take all your medications with you for your next visit with your Primary MD  Please request your Primary MD to go over all hospital tests and procedure/radiological results at the follow up, please ask your Primary MD to get all Hospital records sent to his/her office.  If you experience worsening of your admission symptoms, develop shortness of breath, life threatening emergency, suicidal or homicidal  thoughts you must seek medical attention immediately by calling 911 or calling your MD immediately if symptoms less severe.  You must read complete instructions/literature along with all the possible adverse reactions/side effects for all the Medicines you take and that have been prescribed to you. Take any new Medicines after you have completely understood and accpet all the possible adverse reactions/side effects.   Do not drive when taking Pain medications.   Do not take more than prescribed Pain, Sleep and Anxiety Medications  Special Instructions: If you have smoked or chewed Tobacco in the last 2 yrs please stop smoking, stop any regular Alcohol and or any Recreational drug use.  Wear Seat belts while driving.  Please note  You were cared for by a hospitalist during your hospital stay. Once you are discharged, your primary care physician will handle any further medical issues. Please note that NO REFILLS for any discharge medications will be authorized once you are discharged, as it is imperative that you return to your primary care physician (or establish a relationship with a primary care physician if you do not have one) for your aftercare needs so that they can reassess your need for medications and monitor your lab values.     Allergies  Allergen Reactions  . Ace Inhibitors Cough  . Clindamycin Rash      Disposition: Independent living   Consults:  None  Significant Diagnostic Studies:  Dg Chest 2 View  Result Date: 10/17/2016 CLINICAL DATA:  COPD, CHF, worsening weakness and shortness of breath with nausea for 1 week EXAM: CHEST  2 VIEW COMPARISON:  09/02/2015, 09/12/2016 FINDINGS: Cardiomegaly evident with increased vascular and interstitial prominence suspicious for early edema pattern/ CHF. Minor basilar atelectasis. No large effusion. Negative for pneumothorax. Trachea is midline. Aortic atherosclerosis noted. Known 10 mm right lower lobe nodule again  demonstrated, without significant change. IMPRESSION: Cardiomegaly with mild interstitial edema pattern. Aortic atherosclerosis 10 mm right lower lobe pulmonary nodule as previously described. Electronically Signed   By: Jerilynn Mages.  Shick M.D.   On: 10/17/2016 14:58        Filed Weights   10/17/16 1958 10/18/16 0507 10/19/16 0604  Weight: 133.7 kg (294 lb 11.2 oz) (!) 137.4 kg (303 lb) (!) 137.6 kg (303 lb 4.8 oz)     Microbiology: Recent Results (from the past 240 hour(s))  Urine culture     Status: Abnormal (Preliminary result)   Collection Time: 10/17/16  4:54 PM  Result Value Ref Range Status   Specimen Description URINE, RANDOM  Final   Special Requests NONE  Final   Culture >=100,000 COLONIES/mL GRAM NEGATIVE RODS (A)  Final   Report Status PENDING  Incomplete  MRSA PCR Screening     Status: None   Collection Time: 10/18/16 12:15 AM  Result Value Ref Range Status   MRSA by PCR NEGATIVE NEGATIVE Final    Comment:        The GeneXpert MRSA Assay (FDA approved for NASAL specimens only), is one component of a comprehensive MRSA colonization surveillance program. It is not intended to diagnose MRSA infection nor to guide or monitor treatment for MRSA infections.        Blood Culture    Component Value Date/Time   SDES URINE, RANDOM 10/17/2016 1654   SPECREQUEST NONE 10/17/2016 1654   CULT >=100,000 COLONIES/mL GRAM NEGATIVE RODS (A) 10/17/2016 1654   REPTSTATUS PENDING 10/17/2016 1654      Labs: Results for orders placed or performed during the hospital encounter of 10/17/16 (from the past 48 hour(s))  CBC with Differential/Platelet     Status: Abnormal   Collection Time: 10/17/16  2:19 PM  Result Value Ref Range   WBC 10.7 (H) 4.0 - 10.5 K/uL   RBC 3.74 (L) 3.87 - 5.11 MIL/uL   Hemoglobin 11.0 (L) 12.0 - 15.0 g/dL   HCT 35.2 (L) 36.0 - 46.0 %   MCV 94.1 78.0 - 100.0 fL   MCH 29.4 26.0 - 34.0 pg   MCHC 31.3 30.0 - 36.0 g/dL   RDW 17.5 (H) 11.5 - 15.5 %    Platelets 231 150 - 400 K/uL   Neutrophils Relative % 89 %   Neutro Abs 9.6 (  H) 1.7 - 7.7 K/uL   Lymphocytes Relative 6 %   Lymphs Abs 0.6 (L) 0.7 - 4.0 K/uL   Monocytes Relative 5 %   Monocytes Absolute 0.5 0.1 - 1.0 K/uL   Eosinophils Relative 0 %   Eosinophils Absolute 0.0 0.0 - 0.7 K/uL   Basophils Relative 0 %   Basophils Absolute 0.0 0.0 - 0.1 K/uL  Comprehensive metabolic panel     Status: Abnormal   Collection Time: 10/17/16  2:19 PM  Result Value Ref Range   Sodium 136 135 - 145 mmol/L   Potassium 4.7 3.5 - 5.1 mmol/L   Chloride 96 (L) 101 - 111 mmol/L   CO2 30 22 - 32 mmol/L   Glucose, Bld 76 65 - 99 mg/dL   BUN 31 (H) 6 - 20 mg/dL   Creatinine, Ser 1.53 (H) 0.44 - 1.00 mg/dL   Calcium 9.1 8.9 - 10.3 mg/dL   Total Protein 7.1 6.5 - 8.1 g/dL   Albumin 3.6 3.5 - 5.0 g/dL   AST 31 15 - 41 U/L   ALT 16 14 - 54 U/L   Alkaline Phosphatase 58 38 - 126 U/L   Total Bilirubin 0.8 0.3 - 1.2 mg/dL   GFR calc non Af Amer 33 (L) >60 mL/min   GFR calc Af Amer 38 (L) >60 mL/min    Comment: (NOTE) The eGFR has been calculated using the CKD EPI equation. This calculation has not been validated in all clinical situations. eGFR's persistently <60 mL/min signify possible Chronic Kidney Disease.    Anion gap 10 5 - 15  Lactic acid, plasma     Status: None   Collection Time: 10/17/16  2:19 PM  Result Value Ref Range   Lactic Acid, Venous 0.9 0.5 - 1.9 mmol/L  Urinalysis, Routine w reflex microscopic (not at [facility])     Status: Abnormal   Collection Time: 10/17/16  4:08 PM  Result Value Ref Range   Color, Urine YELLOW YELLOW   APPearance CLEAR CLEAR   Specific Gravity, Urine 1.008 1.005 - 1.030   pH 6.0 5.0 - 8.0   Glucose, UA NEGATIVE NEGATIVE mg/dL   Hgb urine dipstick MODERATE (A) NEGATIVE   Bilirubin Urine NEGATIVE NEGATIVE   Ketones, ur NEGATIVE NEGATIVE mg/dL   Protein, ur NEGATIVE NEGATIVE mg/dL   Nitrite POSITIVE (A) NEGATIVE   Leukocytes, UA MODERATE (A) NEGATIVE   Urine microscopic-add on     Status: Abnormal   Collection Time: 10/17/16  4:08 PM  Result Value Ref Range   Squamous Epithelial / LPF 0-5 (A) NONE SEEN   WBC, UA 6-30 0 - 5 WBC/hpf   RBC / HPF 0-5 0 - 5 RBC/hpf   Bacteria, UA MANY (A) NONE SEEN   Casts HYALINE CASTS (A) NEGATIVE  Brain natriuretic peptide     Status: Abnormal   Collection Time: 10/17/16  4:12 PM  Result Value Ref Range   B Natriuretic Peptide 284.1 (H) 0.0 - 100.0 pg/mL  I-Stat Troponin, ED (not at [facility])     Status: None   Collection Time: 10/17/16  4:23 PM  Result Value Ref Range   Troponin i, poc 0.01 0.00 - 0.08 ng/mL   Comment 3            Comment: Due to the release kinetics of cTnI, a negative result within the first hours of the onset of symptoms does not rule out myocardial infarction with certainty. If myocardial infarction is still suspected, repeat the test at  appropriate intervals.   Urine culture     Status: Abnormal (Preliminary result)   Collection Time: 10/17/16  4:54 PM  Result Value Ref Range   Specimen Description URINE, RANDOM    Special Requests NONE    Culture >=100,000 COLONIES/mL GRAM NEGATIVE RODS (A)    Report Status PENDING   Glucose, capillary     Status: Abnormal   Collection Time: 10/17/16  8:57 PM  Result Value Ref Range   Glucose-Capillary 150 (H) 65 - 99 mg/dL   Comment 1 Notify RN    Comment 2 Document in Chart   Troponin I     Status: None   Collection Time: 10/17/16  9:42 PM  Result Value Ref Range   Troponin I <0.03 <0.03 ng/mL  MRSA PCR Screening     Status: None   Collection Time: 10/18/16 12:15 AM  Result Value Ref Range   MRSA by PCR NEGATIVE NEGATIVE    Comment:        The GeneXpert MRSA Assay (FDA approved for NASAL specimens only), is one component of a comprehensive MRSA colonization surveillance program. It is not intended to diagnose MRSA infection nor to guide or monitor treatment for MRSA infections.   Basic metabolic panel     Status: Abnormal    Collection Time: 10/18/16  3:31 AM  Result Value Ref Range   Sodium 137 135 - 145 mmol/L   Potassium 3.9 3.5 - 5.1 mmol/L    Comment: NO VISIBLE HEMOLYSIS   Chloride 96 (L) 101 - 111 mmol/L   CO2 31 22 - 32 mmol/L   Glucose, Bld 146 (H) 65 - 99 mg/dL   BUN 29 (H) 6 - 20 mg/dL   Creatinine, Ser 1.58 (H) 0.44 - 1.00 mg/dL   Calcium 8.8 (L) 8.9 - 10.3 mg/dL   GFR calc non Af Amer 32 (L) >60 mL/min   GFR calc Af Amer 37 (L) >60 mL/min    Comment: (NOTE) The eGFR has been calculated using the CKD EPI equation. This calculation has not been validated in all clinical situations. eGFR's persistently <60 mL/min signify possible Chronic Kidney Disease.    Anion gap 10 5 - 15  CBC WITH DIFFERENTIAL     Status: Abnormal   Collection Time: 10/18/16  3:31 AM  Result Value Ref Range   WBC 6.4 4.0 - 10.5 K/uL   RBC 3.31 (L) 3.87 - 5.11 MIL/uL   Hemoglobin 9.4 (L) 12.0 - 15.0 g/dL   HCT 31.1 (L) 36.0 - 46.0 %   MCV 94.0 78.0 - 100.0 fL   MCH 28.4 26.0 - 34.0 pg   MCHC 30.2 30.0 - 36.0 g/dL   RDW 17.5 (H) 11.5 - 15.5 %   Platelets 202 150 - 400 K/uL   Neutrophils Relative % 78 %   Neutro Abs 5.0 1.7 - 7.7 K/uL   Lymphocytes Relative 13 %   Lymphs Abs 0.8 0.7 - 4.0 K/uL   Monocytes Relative 8 %   Monocytes Absolute 0.5 0.1 - 1.0 K/uL   Eosinophils Relative 1 %   Eosinophils Absolute 0.1 0.0 - 0.7 K/uL   Basophils Relative 0 %   Basophils Absolute 0.0 0.0 - 0.1 K/uL  Troponin I     Status: None   Collection Time: 10/18/16  3:31 AM  Result Value Ref Range   Troponin I <0.03 <0.03 ng/mL  Glucose, capillary     Status: Abnormal   Collection Time: 10/18/16  6:02 AM  Result Value Ref  Range   Glucose-Capillary 195 (H) 65 - 99 mg/dL   Comment 1 Notify RN    Comment 2 Document in Chart   Troponin I     Status: None   Collection Time: 10/18/16 11:09 AM  Result Value Ref Range   Troponin I <0.03 <0.03 ng/mL  Glucose, capillary     Status: Abnormal   Collection Time: 10/18/16 11:38 AM   Result Value Ref Range   Glucose-Capillary 217 (H) 65 - 99 mg/dL  Troponin I     Status: None   Collection Time: 10/18/16  3:54 PM  Result Value Ref Range   Troponin I <0.03 <0.03 ng/mL  Glucose, capillary     Status: Abnormal   Collection Time: 10/18/16  4:11 PM  Result Value Ref Range   Glucose-Capillary 163 (H) 65 - 99 mg/dL   Comment 1 Notify RN   Glucose, capillary     Status: Abnormal   Collection Time: 10/18/16  8:33 PM  Result Value Ref Range   Glucose-Capillary 235 (H) 65 - 99 mg/dL   Comment 1 Notify RN    Comment 2 Document in Chart   Troponin I     Status: None   Collection Time: 10/18/16 10:25 PM  Result Value Ref Range   Troponin I <0.03 <0.03 ng/mL  Glucose, capillary     Status: Abnormal   Collection Time: 10/18/16 11:15 PM  Result Value Ref Range   Glucose-Capillary 242 (H) 65 - 99 mg/dL  Troponin I     Status: None   Collection Time: 10/19/16  3:55 AM  Result Value Ref Range   Troponin I <0.03 <0.03 ng/mL  Comprehensive metabolic panel     Status: Abnormal   Collection Time: 10/19/16  3:55 AM  Result Value Ref Range   Sodium 136 135 - 145 mmol/L   Potassium 4.5 3.5 - 5.1 mmol/L   Chloride 98 (L) 101 - 111 mmol/L   CO2 30 22 - 32 mmol/L   Glucose, Bld 209 (H) 65 - 99 mg/dL   BUN 40 (H) 6 - 20 mg/dL   Creatinine, Ser 1.76 (H) 0.44 - 1.00 mg/dL   Calcium 9.0 8.9 - 10.3 mg/dL   Total Protein 6.7 6.5 - 8.1 g/dL   Albumin 3.1 (L) 3.5 - 5.0 g/dL   AST 22 15 - 41 U/L   ALT 17 14 - 54 U/L   Alkaline Phosphatase 54 38 - 126 U/L   Total Bilirubin 0.6 0.3 - 1.2 mg/dL   GFR calc non Af Amer 28 (L) >60 mL/min   GFR calc Af Amer 32 (L) >60 mL/min    Comment: (NOTE) The eGFR has been calculated using the CKD EPI equation. This calculation has not been validated in all clinical situations. eGFR's persistently <60 mL/min signify possible Chronic Kidney Disease.    Anion gap 8 5 - 15  CBC     Status: Abnormal   Collection Time: 10/19/16  3:55 AM  Result Value  Ref Range   WBC 7.3 4.0 - 10.5 K/uL   RBC 3.40 (L) 3.87 - 5.11 MIL/uL   Hemoglobin 9.7 (L) 12.0 - 15.0 g/dL   HCT 31.8 (L) 36.0 - 46.0 %   MCV 93.5 78.0 - 100.0 fL   MCH 28.5 26.0 - 34.0 pg   MCHC 30.5 30.0 - 36.0 g/dL   RDW 17.4 (H) 11.5 - 15.5 %   Platelets 212 150 - 400 K/uL  Glucose, capillary     Status: Abnormal  Collection Time: 10/19/16  6:02 AM  Result Value Ref Range   Glucose-Capillary 223 (H) 65 - 99 mg/dL   Comment 1 Notify RN    Comment 2 Document in Chart      Lipid Panel     Component Value Date/Time   CHOL 142 04/13/2015 1230   TRIG 164 (H) 04/13/2015 1230   HDL 46 04/13/2015 1230   CHOLHDL 3.1 04/13/2015 1230   VLDL 33 04/13/2015 1230   LDLCALC 63 04/13/2015 1230     Lab Results  Component Value Date   HGBA1C 9.7 (H) 11/16/2015   HGBA1C 8.0 (H) 07/13/2015   HGBA1C 6.7 (H) 01/27/2014        HPI    71 [place] with PMH significant for COPD, PAF on Eliquis, and CHF on 2L [place] home oxygenwho presents with fever over the last couple of days with max temp of 101.1 with associated hematuria x 2 days. Patient found to have UTI. Patient presented with dysuria and fever. Also had increased dyspnea with a history of COPD, on 2 L of oxygen at baseline.Chest x-ray showed cardiomegaly with mild interstitial edema  Hospital course Urinary tract infection with hematuria:  Urine culture positive for gram-negative rods from 11/21 Patient started on Rocephin IV Final urine culture still pending at the time of discharge - Continue Rocephin IV No blood culture drawn on admission, blood culture from 11/22 still pending Patient has been afebrile for 48 hours Patient is being switched to Keflex to complete treatment of her urinary tract infection   Leukocytosis:  WBC elevated at 10.7. Improving, and Suspect that this is secondary to patient's acute infection as noted.      Acute on chronic diastolic heart failure.  Last echocardiogram in 03/2015 showing EF of 55-60% with no wall motion abnormalities. Patient presents with a moderately elevated BNP of 284.1. Chest x-ray showing cardiomegaly with mild interstitial edema. Initiated on  40 mg of Lasix IV every 12 to wean oxygen to baseline use - Strict I&O's and Daily weights Repeat 2-D echo on  11/22 shows EF of 45-50%, mild-to-moderate aortic stenosis, moderate pulmonary hypertension Patient should follow-up with cardiology in the outpatient setting Resume home dose of Lasix, Zaroxolyn as needed  Patient has previously seen Dr. Marigene Ehlers    COPD:  acute exacerbation. Patient with acute onset of wheezing shortness of breath, coughing, and increased oxygen requirements. Suspect this could be acute COPD exacerbation versus bronchitis vs CHF exacerbation.  Patient given 60 mg of by mouth prednisone in the emergency department. - Albuterol scheduled every 6 hours and when necessary every 2 hours - Continue medications of Spiriva - Changed Symbicort inhaler to Pulmicort and Brovana nebs   Not being discharged on steroids due to no wheezing  PAF (paroxysmal atrial fibrillation) s/p multiple DCCVs; on Amiodarone: Rate Controlled on amiodarone. Patient has a chadsvasc score of 5 and is on chronic anticoagulation of Eliquis. - Continue Eliquis, amiodarone, diltiazem  Essential Hypertension: Stable - Continue metoprolol, all other medications seen above  Diabetes mellitus type 2:Patient found to have a blood glucose on admission of 499 no anion gap. Patient's last hemoglobin A1c was noted to be 8 back in August2016. - Hypoglycemic protocols - CBGs every before meals and at bedtime with moderatesliding scale insulin   Chronic renal disease, stage III: Stable. Creatinine appears to be at baseline around 1.5  - Continue to monitor  Iron deficiency anemia: Hemoglobin at 11g/dL and this appear near patient's last  admission. Hemoglobin now 9.4 - Continue  Ferrous Sulfate  Hyperlipidemia - Continue Vytorin  Morbid obesity- BMI 50  GERD - Pharmacy substitution of Protonix or omeprazole      Discharge Exam:   Blood pressure (!) 137/58, pulse 61, temperature 98.1 F (36.7 C), temperature source Oral, resp. rate 18, height 5' 5"  (1.651 m), weight (!) 137.6 kg (303 lb 4.8 oz), SpO2 97 %.  Respiratory: Decreased overall breath sounds bilaterally. Some fine crackles appreciated, but no wheezing appreciated after breathing treatment.  Normal respiratory effort. No accessory muscle use.  Cardiovascular: Regular rate and rhythm, no murmurs / rubs / gallops. +1 pitting lower  extremity edema. 2+ pedal pulses. No carotid bruits.  Abdomen: no tenderness, no masses palpated. No hepatosplenomegaly. Bowel sounds positive.  Musculoskeletal: no clubbing / cyanosis. No joint deformity upper and lower extremities. Good ROM, no contractures. Normal muscle tone.  Skin: no rashes, lesions, ulcers. No induration Neurologic: CN 2-12 grossly intact. Sensation intact, DTR normal. Strength 5/5 in all     Follow-up Information    BURNETT,BRENT A, MD. Call.   Specialty:  Family Medicine Why:  Hospital follow-up Contact information: [address] [place] [address] [phone]           Signed: Reyne Dumas 10/19/2016, 10:14 AM        Time spent >45 mins

## 2016-10-19 NOTE — Progress Notes (Signed)
Patient up sitting on the side of bed, No distress, states that she feels better and hoping to go home today.

## 2016-10-19 NOTE — Clinical Social Work Note (Signed)
MSW notified by bedside RN at 11:44AM that patient was stable for discharge. MSW contacted facility RN, Lannette Donath who indicated that she has already departed facility and will not be able to accept patient until Friday, 11/24.   MSW has informed bedside RN. MSW to notify MD.   Tiajuana Amass to dc: no one to re-admitted patient into [facility] after 11AM.   MSW remains available as needed.   Glendon Axe, MSW [phone] 10/19/2016 11:56 AM

## 2016-10-19 NOTE — Progress Notes (Signed)
Pt is alert and oriented discharged on hold til tomorrow.

## 2016-10-20 ENCOUNTER — Encounter (HOSPITAL_COMMUNITY): Payer: Self-pay | Admitting: *Deleted

## 2016-10-20 LAB — BASIC METABOLIC PANEL
Anion gap: 9 (ref 5–15)
BUN: 40 mg/dL — AB (ref 6–20)
CHLORIDE: 98 mmol/L — AB (ref 101–111)
CO2: 30 mmol/L (ref 22–32)
Calcium: 9.2 mg/dL (ref 8.9–10.3)
Creatinine, Ser: 1.65 mg/dL — ABNORMAL HIGH (ref 0.44–1.00)
GFR calc Af Amer: 35 mL/min — ABNORMAL LOW (ref >60)
GFR calc non Af Amer: 30 mL/min — ABNORMAL LOW (ref >60)
GLUCOSE: 218 mg/dL — AB (ref 65–99)
POTASSIUM: 4.6 mmol/L (ref 3.5–5.1)
Sodium: 137 mmol/L (ref 135–145)

## 2016-10-20 LAB — GLUCOSE, CAPILLARY
GLUCOSE-CAPILLARY: 226 mg/dL — AB (ref 65–99)
Glucose-Capillary: 218 mg/dL — ABNORMAL HIGH (ref 65–99)

## 2016-10-20 LAB — URINE CULTURE

## 2016-10-20 MED ORDER — ARFORMOTEROL TARTRATE 15 MCG/2ML IN NEBU: 15.0000 ug | INHALATION_SOLUTION | Freq: Two times a day (BID) | RESPIRATORY_TRACT | 1 refills | Status: DC

## 2016-10-20 MED ORDER — CEPHALEXIN 500 MG PO CAPS: 500.0000 mg | ORAL_CAPSULE | Freq: Three times a day (TID) | ORAL | 0 refills | Status: AC

## 2016-10-20 NOTE — Progress Notes (Signed)
Called [facility]. Report given to [facility]. Pt IV removed, catheter intact, and telemetry box removed. Awaiting transportation via [place].  Kennieth Plotts 10/20/2016 1109

## 2016-10-20 NOTE — Clinical Social Work Placement (Signed)
   CLINICAL SOCIAL WORK PLACEMENT  NOTE  Date:  10/20/2016  Patient Details  Name: Paige Hardin MRN: [id number] Date of Birth: 05/14/1946  Clinical Social Work is seeking post-discharge placement for this patient at the [place] level of care (*CSW will initial, date and re-position this form in  chart as items are completed):      Patient/family provided with [facility] Work Department's list of facilities offering this level of care within the geographic area requested by the patient (or if unable, by the patient's family).  Yes   Patient/family informed of their freedom to choose among providers that offer the needed level of care, that participate in Medicare, Medicaid or managed care program needed by the patient, have an available bed and are willing to accept the patient.      Patient/family informed of [facility]'s ownership interest in [facility] and [facility], as well as of the fact that they are under no obligation to receive care at these facilities.  PASRR submitted to EDS on       PASRR number received on       Existing PASRR number confirmed on 10/18/16     FL2 transmitted to all facilities in geographic area requested by pt/family on       FL2 transmitted to all facilities within larger geographic area on       Patient informed that his/her managed care company has contracts with or will negotiate with certain facilities, including the following:            Patient/family informed of bed offers received.  Patient chooses bed at [facility]     Physician recommends and patient chooses bed at      Patient to be transferred to [facility] on 10/20/16.  Patient to be transferred to facility by [facility]     Patient family notified on 10/20/16 of transfer.  Name of family member notified:        PHYSICIAN Please sign DNR     Additional Comment:     _______________________________________________ Alla German, LCSW 10/20/2016, 9:57 AM

## 2016-10-20 NOTE — NC FL2 (Signed)
[place] LEVEL OF CARE SCREENING TOOL     IDENTIFICATION  Patient Name: Paige Hardin Birthdate: 03-13-1946 Sex: female Admission Date (Current Location): 10/17/2016  [facility] and [place] Number:  Herbalist and Address:  [facility]. [facility], [place] [address], [place], [place] [address]      Provider Number: [id number]  Attending Physician Name and Address:  Reyne Dumas, MD  Relative Name and Phone Number:       Current Level of Care: Hospital Recommended Level of Care: [place] Prior Approval Number:    Date Approved/Denied:   PASRR Number:    Discharge Plan: Other (Comment) (ALF)    Current Diagnoses: Patient Active Problem List   Diagnosis Date Noted  . CHF exacerbation ([place]) 10/17/2016  . CHF (congestive heart failure) ([place]) 10/17/2016  . UTI (urinary tract infection) 10/17/2016  . Solitary pulmonary nodule 12/23/2015  . Type 2 diabetes mellitus with hyperglycemia ([place]) 11/16/2015  . Hyperlipidemia 11/16/2015  . Excessive daytime sleepiness 09/08/2015  . Iron deficiency anemia 03/29/2014  . Atrial fibrillation with RVR- DCCV 03/27/14 03/25/2014  . Altered mental status 02/24/2014  . HTN (hypertension) 01/21/2014  . Chronic anticoagulation 01/21/2014  . Pulmonary HTN- pa 53 mmHg Echo 01/15/14 01/21/2014  . Morbid obesity- BMI 50 01/21/2014  . Poorly controlled diabetes mellitus ([place]) 01/21/2014  . Chronic renal disease, stage III 01/21/2014  . Chronic diastolic heart failure ([place]) 01/14/2014  . CHEST PAIN 09/26/2010  . HYPERLIPIDEMIA-MIXED 05/14/2009  . HOARSENESS 05/14/2009  . CAROTID BRUIT 05/14/2009  . COPD with emphysema  05/05/2009  . PAF (paroxysmal atrial fibrillation) s/p multiple DCCVs; on Amiodarone 05/05/2009    Orientation RESPIRATION BLADDER Height & Weight     Self, Time, Situation, Place  O2 (Nasal Canula 2.5 L) Continent Weight: (!) 303 lb (137.4 kg) Height:  5\' 5"  (165.1 cm)   BEHAVIORAL SYMPTOMS/MOOD NEUROLOGICAL BOWEL NUTRITION STATUS   (None)  (None) Continent  ([facility]HO)  AMBULATORY STATUS COMMUNICATION OF NEEDS Skin   Independent Verbally Normal                       Personal Care Assistance Level of Assistance              Functional Limitations Info  Sight, Hearing, Speech Sight Info: Adequate Hearing Info: Adequate Speech Info: Adequate    SPECIAL CARE FACTORS FREQUENCY  Blood pressure, Diabetic urine testing                    Contractures Contractures Info: Not present    Additional Factors Info  Code Status, Allergies Code Status Info: DNR Allergies Info: Ace Inhibitors, Clindamycin           Current Medications (10/20/2016):  This is the current hospital active medication list Current Facility-Administered Medications  Medication Dose Route Frequency Provider Last Rate Last Dose  . 0.9 %  sodium chloride infusion  250 mL Intravenous PRN Norval Morton, MD      . acetaminophen (TYLENOL) tablet 650 mg  650 mg Oral Q4H PRN Rondell A Tamala Julian, MD      . albuterol (PROVENTIL) (2.5 MG/3ML) 0.083% nebulizer solution 2.5 mg  2.5 mg Nebulization Q4H PRN Norval Morton, MD      . allopurinol (ZYLOPRIM) tablet 100 mg  100 mg Oral Daily Norval Morton, MD   100 mg at 10/19/16 1025  . amiodarone (PACERONE) tablet 200 mg  200 mg Oral  Daily Norval Morton, MD   200 mg at 10/19/16 1025  . apixaban (ELIQUIS) tablet 5 mg  5 mg Oral BID Norval Morton, MD   5 mg at 10/19/16 2230  . arformoterol (BROVANA) nebulizer solution 15 mcg  15 mcg Nebulization BID Norval Morton, MD   15 mcg at 10/20/16 0924  . benzonatate (TESSALON) capsule 200 mg  200 mg Oral BID PRN Norval Morton, MD      . budesonide (PULMICORT) nebulizer solution 0.5 mg  0.5 mg Nebulization BID Norval Morton, MD   0.5 mg at 10/20/16 0926  . calcium-vitamin D (OSCAL WITH D) 500-200 MG-UNIT per tablet 1 tablet  1 tablet Oral Q breakfast Norval Morton, MD   1 tablet at  10/20/16 0752  . cefTRIAXone (ROCEPHIN) 1 g in dextrose 5 % 50 mL IVPB  1 g Intravenous Q24H Kimberly B Hammons, RPH   1 g at 10/18/16 1633  . cholecalciferol (VITAMIN D) tablet 2,000 Units  2,000 Units Oral Daily Norval Morton, MD   2,000 Units at 10/19/16 1025  . diltiazem (CARDIZEM CD) 24 hr capsule 180 mg  180 mg Oral Daily Norval Morton, MD   180 mg at 10/19/16 1025  . ezetimibe-simvastatin (VYTORIN) 10-40 MG per tablet 1 tablet  1 tablet Oral QHS Norval Morton, MD   1 tablet at 10/19/16 2144  . ferrous sulfate tablet 325 mg  325 mg Oral BID WC Norval Morton, MD   325 mg at 10/20/16 0752  . guaiFENesin (ROBITUSSIN) 100 MG/5ML solution 200 mg  200 mg Oral TID PRN Norval Morton, MD      . insulin aspart (novoLOG) injection 0-15 Units  0-15 Units Subcutaneous TID WC Norval Morton, MD   5 Units at 10/20/16 [phone]  . insulin aspart (novoLOG) injection 0-5 Units  0-5 Units Subcutaneous QHS Norval Morton, MD   3 Units at 10/19/16 2143  . metolazone (ZAROXOLYN) tablet 2.5 mg  2.5 mg Oral Daily PRN Norval Morton, MD   2.5 mg at 10/19/16 1621  . metoprolol tartrate (LOPRESSOR) tablet 50 mg  50 mg Oral BID Norval Morton, MD   50 mg at 10/19/16 2145  . ondansetron (ZOFRAN) injection 4 mg  4 mg Intravenous Q6H PRN Norval Morton, MD      . pantoprazole (PROTONIX) EC tablet 40 mg  40 mg Oral Daily Norval Morton, MD   40 mg at 10/19/16 1025  . potassium chloride SA (K-DUR,KLOR-CON) CR tablet 20 mEq  20 mEq Oral TID Norval Morton, MD   20 mEq at 10/19/16 2144  . sodium chloride flush (NS) 0.9 % injection 3 mL  3 mL Intravenous Q12H Norval Morton, MD   3 mL at 10/20/16 0935  . sodium chloride flush (NS) 0.9 % injection 3 mL  3 mL Intravenous PRN Norval Morton, MD      . tiotropium (SPIRIVA) inhalation capsule 18 mcg  18 mcg Inhalation Daily Norval Morton, MD   18 mcg at 10/20/16 0932  . vitamin C (ASCORBIC ACID) tablet 500 mg  500 mg Oral Daily Norval Morton, MD   500 mg at 10/19/16  1028     Discharge Medications: Please see discharge summary for a list of discharge medications.  Relevant Imaging Results:  Relevant Lab Results:   Additional Information SS#: [id number]  Alla German, LCSW

## 2016-10-20 NOTE — Clinical Social Work Note (Signed)
Clinical Social Worker facilitated patient discharge including contacting patient family and facility to confirm patient discharge plans.  Clinical information faxed to facility and family agreeable with plan.  CSW arranged ambulance transport via [place] to [place] .  RN to call report prior to discharge.  Clinical Social Worker will sign off for now as social work intervention is no longer needed. Please consult [place] again if new need arises.  [address], [place]

## 2016-10-20 NOTE — Discharge Summary (Addendum)
Physician Discharge Summary -no changes from 11/23  Paige Hardin MRN: [id number] DOB/AGE: 12/23/1945 70 y.o.  PCP: Stephens Shire, MD   Admit date: 10/17/2016 Discharge date: 10/20/2016  Discharge Diagnoses:    Principal Problem:   UTI (urinary tract infection) Active Problems:   COPD with emphysema    HTN (hypertension)   Chronic anticoagulation   Chronic renal disease, stage III   Atrial fibrillation with RVR- DCCV 03/27/14   Iron deficiency anemia   Hyperlipidemia   CHF exacerbation (HCC)   CHF (congestive heart failure) ([place])    Follow-up recommendations Follow-up with PCP in 3-5 days , including all  additional recommended appointments as below Follow-up CBC, CMP in 3-5 days Resume Zaroxolyn as needed for weight   of greater than 300 pounds Please follow renal function closely on a weekly basis History check hemoglobin A1c if recent one not on record Please send a follow-up appointment with Larey Dresser, MD in the next 1-2 weeks      Current Discharge Medication List    START taking these medications   Details  arformoterol (BROVANA) 15 MCG/2ML NEBU Take 2 mLs (15 mcg total) by nebulization 2 (two) times daily. Qty: 120 mL, Refills: 1    cephALEXin (KEFLEX) 500 MG capsule Take 1 capsule (500 mg total) by mouth 3 (three) times daily. Qty: 21 capsule, Refills: 0      CONTINUE these medications which have CHANGED   Details  metolazone (ZAROXOLYN) 2.5 MG tablet Take 1 tablet only if weight is greater or equal to 300 lbs as needed Qty: 30 tablet, Refills: 3   Associated Diagnoses: Chronic diastolic heart failure (HCC)      CONTINUE these medications which have NOT CHANGED   Details  acetaminophen (TYLENOL) 325 MG tablet Take 325 mg by mouth daily as needed for mild pain.     albuterol (PROVENTIL HFA;VENTOLIN HFA) 108 (90 Base) MCG/ACT inhaler Inhale 2 puffs into the lungs every 6 (six) hours as needed for wheezing or shortness of breath.    albuterol  (PROVENTIL) (2.5 MG/3ML) 0.083% nebulizer solution Take 2.5 mg by nebulization every 6 (six) hours as needed for wheezing or shortness of breath.    allopurinol (ZYLOPRIM) 100 MG tablet Take 1 tablet (100 mg total) by mouth daily.    amiodarone (PACERONE) 200 MG tablet Take 1 tablet (200 mg total) by mouth daily.   Associated Diagnoses: PAF (paroxysmal atrial fibrillation) (HCC)    apixaban (ELIQUIS) 5 MG TABS tablet Take 1 tablet (5 mg total) by mouth 2 (two) times daily. Qty: 60 tablet    Ascorbic Acid (VITAMIN C) 500 MG CAPS Take 500 mg by mouth daily.     benzonatate (TESSALON) 200 MG capsule Take 1 capsule (200 mg total) by mouth 2 (two) times daily as needed for cough. Qty: 30 capsule, Refills: 0    budesonide-formoterol (SYMBICORT) 160-4.5 MCG/ACT inhaler Inhale 2 puffs into the lungs 2 (two) times daily. Qty: 1 Inhaler, Refills: 5    calcium-vitamin D (OSCAL WITH D) 500-200 MG-UNIT per tablet Take 1 tablet by mouth daily with breakfast.    Cholecalciferol (VITAMIN D) 2000 UNITS tablet Take 2,000 Units by mouth daily.    diltiazem (CARDIZEM CD) 180 MG 24 hr capsule Take 1 capsule (180 mg total) by mouth daily. Qty: 30 capsule, Refills: 3    ezetimibe-simvastatin (VYTORIN) 10-40 MG per tablet Take 1 tablet by mouth at bedtime.     ferrous sulfate 325 (65 FE) MG tablet Take 325  mg by mouth 2 (two) times daily with a meal.     furosemide (LASIX) 80 MG tablet Take 2 tablets (160 mg total) by mouth 2 (two) times daily. MAKE SURE TO TAKE AT 8 AM AND 2 PM   Associated Diagnoses: Acute on chronic diastolic heart failure (HCC)    guaiFENesin (DIABETIC TUSSIN EX) 100 MG/5ML liquid Take 200 mg by mouth 3 (three) times daily as needed for cough.    !! insulin aspart (NOVOLOG) 100 UNIT/ML injection Inject 5 Units into the skin 3 (three) times daily with meals. Qty: 10 mL, Refills: 11    !! insulin aspart (NOVOLOG) 100 UNIT/ML injection Inject 0-9 Units into the skin 3 (three) times  daily with meals. Correction factor Sliding scale CBG 70 - 120: 0 units CBG 121 - 150: 1 unit,  CBG 151 - 200: 2 units,  CBG 201 - 250: 3 units,  CBG 251 - 300: 5 units,  CBG 301 - 350: 7 units,  CBG 351 - 400: 9 units   CBG > 400: 9 units and notify your MD Qty: 10 mL, Refills: 11    insulin NPH Human (HUMULIN N,NOVOLIN N) 100 UNIT/ML injection Inject 20 Units into the skin 2 (two) times daily before a meal.    Associated Diagnoses: Iron deficiency anemia; Chronic renal disease, stage III; Anemia associated with chronic renal failure, stage 3 (moderate)    metoprolol (LOPRESSOR) 50 MG tablet Take 1 tablet (50 mg total) by mouth 2 (two) times daily. Qty: 60 tablet, Refills: 3    Multiple Vitamins-Iron (MULTIVITAMIN/IRON PO) Take 1 tablet by mouth daily.    omeprazole (PRILOSEC) 20 MG capsule Take 20 mg by mouth daily.    OXYGEN Inhale 2 L into the lungs continuous.    potassium chloride SA (K-DUR,KLOR-CON) 20 MEQ tablet Take 20 mEq by mouth 3 (three) times daily. MAKE SURE TO TAKE EXTRA 20 MEQ ON METOLAZONE DAYS    tiotropium (SPIRIVA) 18 MCG inhalation capsule Place 1 capsule (18 mcg total) into inhaler and inhale daily. Qty: 30 capsule, Refills: 5    vitamin B-12 (CYANOCOBALAMIN) 500 MCG tablet Take 500 mcg by mouth daily.     !! - Potential duplicate medications found. Please discuss with provider.       Discharge Condition: Stable   Discharge Instructions Get Medicines reviewed and adjusted: Please take all your medications with you for your next visit with your Primary MD  Please request your Primary MD to go over all hospital tests and procedure/radiological results at the follow up, please ask your Primary MD to get all Hospital records sent to his/her office.  If you experience worsening of your admission symptoms, develop shortness of breath, life threatening emergency, suicidal or homicidal thoughts you must seek medical attention immediately by calling 911 or calling  your MD immediately if symptoms less severe.  You must read complete instructions/literature along with all the possible adverse reactions/side effects for all the Medicines you take and that have been prescribed to you. Take any new Medicines after you have completely understood and accpet all the possible adverse reactions/side effects.   Do not drive when taking Pain medications.   Do not take more than prescribed Pain, Sleep and Anxiety Medications  Special Instructions: If you have smoked or chewed Tobacco in the last 2 yrs please stop smoking, stop any regular Alcohol and or any Recreational drug use.  Wear Seat belts while driving.  Please note  You were cared for by a  hospitalist during your hospital stay. Once you are discharged, your primary care physician will handle any further medical issues. Please note that NO REFILLS for any discharge medications will be authorized once you are discharged, as it is imperative that you return to your primary care physician (or establish a relationship with a primary care physician if you do not have one) for your aftercare needs so that they can reassess your need for medications and monitor your lab values.  Discharge Instructions    Diet - low sodium heart healthy    Complete by:  As directed    Diet - low sodium heart healthy    Complete by:  As directed    Increase activity slowly    Complete by:  As directed    Increase activity slowly    Complete by:  As directed        Allergies  Allergen Reactions  . Ace Inhibitors Cough  . Clindamycin Rash      Disposition: Independent living   Consults:  None  Significant Diagnostic Studies:  Dg Chest 2 View  Result Date: 10/17/2016 CLINICAL DATA:  COPD, CHF, worsening weakness and shortness of breath with nausea for 1 week EXAM: CHEST  2 VIEW COMPARISON:  09/02/2015, 09/12/2016 FINDINGS: Cardiomegaly evident with increased vascular and interstitial prominence suspicious for  early edema pattern/ CHF. Minor basilar atelectasis. No large effusion. Negative for pneumothorax. Trachea is midline. Aortic atherosclerosis noted. Known 10 mm right lower lobe nodule again demonstrated, without significant change. IMPRESSION: Cardiomegaly with mild interstitial edema pattern. Aortic atherosclerosis 10 mm right lower lobe pulmonary nodule as previously described. Electronically Signed   By: Jerilynn Mages.  Shick M.D.   On: 10/17/2016 14:58        Filed Weights   10/18/16 0507 10/19/16 0604 10/20/16 0646  Weight: (!) 137.4 kg (303 lb) (!) 137.6 kg (303 lb 4.8 oz) (!) 137.4 kg (303 lb)     Microbiology: Recent Results (from the past 240 hour(s))  Urine culture     Status: Abnormal   Collection Time: 10/17/16  4:54 PM  Result Value Ref Range Status   Specimen Description URINE, RANDOM  Final   Special Requests NONE  Final   Culture >=100,000 COLONIES/mL KLEBSIELLA PNEUMONIAE (A)  Final   Report Status 10/20/2016 FINAL  Final   Organism ID, Bacteria KLEBSIELLA PNEUMONIAE (A)  Final      Susceptibility   Klebsiella pneumoniae - MIC*    AMPICILLIN >=32 RESISTANT Resistant     CEFAZOLIN <=4 SENSITIVE Sensitive     CEFTRIAXONE <=1 SENSITIVE Sensitive     CIPROFLOXACIN <=0.25 SENSITIVE Sensitive     GENTAMICIN <=1 SENSITIVE Sensitive     IMIPENEM <=0.25 SENSITIVE Sensitive     NITROFURANTOIN 128 RESISTANT Resistant     TRIMETH/SULFA <=20 SENSITIVE Sensitive     AMPICILLIN/SULBACTAM 4 SENSITIVE Sensitive     PIP/TAZO <=4 SENSITIVE Sensitive     Extended ESBL NEGATIVE Sensitive     * >=100,000 COLONIES/mL KLEBSIELLA PNEUMONIAE  MRSA PCR Screening     Status: None   Collection Time: 10/18/16 12:15 AM  Result Value Ref Range Status   MRSA by PCR NEGATIVE NEGATIVE Final    Comment:        The GeneXpert MRSA Assay (FDA approved for NASAL specimens only), is one component of a comprehensive MRSA colonization surveillance program. It is not intended to diagnose MRSA infection nor  to guide or monitor treatment for MRSA infections.   Culture, blood (routine x  2)     Status: None (Preliminary result)   Collection Time: 10/18/16 11:13 AM  Result Value Ref Range Status   Specimen Description BLOOD LEFT ARM  Final   Special Requests BOTTLES DRAWN AEROBIC ONLY 4CC  Final   Culture NO GROWTH 1 DAY  Final   Report Status PENDING  Incomplete  Culture, blood (routine x 2)     Status: None (Preliminary result)   Collection Time: 10/18/16 11:20 AM  Result Value Ref Range Status   Specimen Description BLOOD LEFT ARM  Final   Special Requests BOTTLES DRAWN AEROBIC AND ANAEROBIC 5CC EACH  Final   Culture NO GROWTH 1 DAY  Final   Report Status PENDING  Incomplete       Blood Culture    Component Value Date/Time   SDES BLOOD LEFT ARM 10/18/2016 1120   SPECREQUEST BOTTLES DRAWN AEROBIC AND ANAEROBIC 5CC EACH 10/18/2016 1120   CULT NO GROWTH 1 DAY 10/18/2016 1120   REPTSTATUS PENDING 10/18/2016 1120      Labs: Results for orders placed or performed during the hospital encounter of 10/17/16 (from the past 48 hour(s))  Troponin I     Status: None   Collection Time: 10/18/16 11:09 AM  Result Value Ref Range   Troponin I <0.03 <0.03 ng/mL  Culture, blood (routine x 2)     Status: None (Preliminary result)   Collection Time: 10/18/16 11:13 AM  Result Value Ref Range   Specimen Description BLOOD LEFT ARM    Special Requests BOTTLES DRAWN AEROBIC ONLY 4CC    Culture NO GROWTH 1 DAY    Report Status PENDING   Culture, blood (routine x 2)     Status: None (Preliminary result)   Collection Time: 10/18/16 11:20 AM  Result Value Ref Range   Specimen Description BLOOD LEFT ARM    Special Requests BOTTLES DRAWN AEROBIC AND ANAEROBIC 5CC EACH    Culture NO GROWTH 1 DAY    Report Status PENDING   Glucose, capillary     Status: Abnormal   Collection Time: 10/18/16 11:38 AM  Result Value Ref Range   Glucose-Capillary 217 (H) 65 - 99 mg/dL  Troponin I     Status: None    Collection Time: 10/18/16  3:54 PM  Result Value Ref Range   Troponin I <0.03 <0.03 ng/mL  Glucose, capillary     Status: Abnormal   Collection Time: 10/18/16  4:11 PM  Result Value Ref Range   Glucose-Capillary 163 (H) 65 - 99 mg/dL   Comment 1 Notify RN   Glucose, capillary     Status: Abnormal   Collection Time: 10/18/16  8:33 PM  Result Value Ref Range   Glucose-Capillary 235 (H) 65 - 99 mg/dL   Comment 1 Notify RN    Comment 2 Document in Chart   Troponin I     Status: None   Collection Time: 10/18/16 10:25 PM  Result Value Ref Range   Troponin I <0.03 <0.03 ng/mL  Glucose, capillary     Status: Abnormal   Collection Time: 10/18/16 11:15 PM  Result Value Ref Range   Glucose-Capillary 242 (H) 65 - 99 mg/dL  Troponin I     Status: None   Collection Time: 10/19/16  3:55 AM  Result Value Ref Range   Troponin I <0.03 <0.03 ng/mL  Comprehensive metabolic panel     Status: Abnormal   Collection Time: 10/19/16  3:55 AM  Result Value Ref Range   Sodium 136 135 -  145 mmol/L   Potassium 4.5 3.5 - 5.1 mmol/L   Chloride 98 (L) 101 - 111 mmol/L   CO2 30 22 - 32 mmol/L   Glucose, Bld 209 (H) 65 - 99 mg/dL   BUN 40 (H) 6 - 20 mg/dL   Creatinine, Ser 1.76 (H) 0.44 - 1.00 mg/dL   Calcium 9.0 8.9 - 10.3 mg/dL   Total Protein 6.7 6.5 - 8.1 g/dL   Albumin 3.1 (L) 3.5 - 5.0 g/dL   AST 22 15 - 41 U/L   ALT 17 14 - 54 U/L   Alkaline Phosphatase 54 38 - 126 U/L   Total Bilirubin 0.6 0.3 - 1.2 mg/dL   GFR calc non Af Amer 28 (L) >60 mL/min   GFR calc Af Amer 32 (L) >60 mL/min    Comment: (NOTE) The eGFR has been calculated using the CKD EPI equation. This calculation has not been validated in all clinical situations. eGFR's persistently <60 mL/min signify possible Chronic Kidney Disease.    Anion gap 8 5 - 15  CBC     Status: Abnormal   Collection Time: 10/19/16  3:55 AM  Result Value Ref Range   WBC 7.3 4.0 - 10.5 K/uL   RBC 3.40 (L) 3.87 - 5.11 MIL/uL   Hemoglobin 9.7 (L) 12.0 -  15.0 g/dL   HCT 31.8 (L) 36.0 - 46.0 %   MCV 93.5 78.0 - 100.0 fL   MCH 28.5 26.0 - 34.0 pg   MCHC 30.5 30.0 - 36.0 g/dL   RDW 17.4 (H) 11.5 - 15.5 %   Platelets 212 150 - 400 K/uL  Glucose, capillary     Status: Abnormal   Collection Time: 10/19/16  6:02 AM  Result Value Ref Range   Glucose-Capillary 223 (H) 65 - 99 mg/dL   Comment 1 Notify RN    Comment 2 Document in Chart   Troponin I     Status: None   Collection Time: 10/19/16  9:20 AM  Result Value Ref Range   Troponin I <0.03 <0.03 ng/mL  Glucose, capillary     Status: Abnormal   Collection Time: 10/19/16 11:24 AM  Result Value Ref Range   Glucose-Capillary 259 (H) 65 - 99 mg/dL  Glucose, capillary     Status: Abnormal   Collection Time: 10/19/16  4:10 PM  Result Value Ref Range   Glucose-Capillary 232 (H) 65 - 99 mg/dL  Troponin I     Status: None   Collection Time: 10/19/16  4:11 PM  Result Value Ref Range   Troponin I <0.03 <0.03 ng/mL  Glucose, capillary     Status: Abnormal   Collection Time: 10/19/16  9:38 PM  Result Value Ref Range   Glucose-Capillary 253 (H) 65 - 99 mg/dL   Comment 1 Notify RN    Comment 2 Document in Chart   Basic metabolic panel     Status: Abnormal   Collection Time: 10/20/16  3:22 AM  Result Value Ref Range   Sodium 137 135 - 145 mmol/L   Potassium 4.6 3.5 - 5.1 mmol/L   Chloride 98 (L) 101 - 111 mmol/L   CO2 30 22 - 32 mmol/L   Glucose, Bld 218 (H) 65 - 99 mg/dL   BUN 40 (H) 6 - 20 mg/dL   Creatinine, Ser 1.65 (H) 0.44 - 1.00 mg/dL   Calcium 9.2 8.9 - 10.3 mg/dL   GFR calc non Af Amer 30 (L) >60 mL/min   GFR calc Af Amer 35 (  L) >60 mL/min    Comment: (NOTE) The eGFR has been calculated using the CKD EPI equation. This calculation has not been validated in all clinical situations. eGFR's persistently <60 mL/min signify possible Chronic Kidney Disease.    Anion gap 9 5 - 15  Glucose, capillary     Status: Abnormal   Collection Time: 10/20/16  6:45 AM  Result Value Ref Range    Glucose-Capillary 218 (H) 65 - 99 mg/dL   Comment 1 Notify RN    Comment 2 Document in Chart      Lipid Panel     Component Value Date/Time   CHOL 142 04/13/2015 1230   TRIG 164 (H) 04/13/2015 1230   HDL 46 04/13/2015 1230   CHOLHDL 3.1 04/13/2015 1230   VLDL 33 04/13/2015 1230   LDLCALC 63 04/13/2015 1230     Lab Results  Component Value Date   HGBA1C 9.7 (H) 11/16/2015   HGBA1C 8.0 (H) 07/13/2015   HGBA1C 6.7 (H) 01/27/2014        HPI    70 [place] with PMH significant for COPD, PAF on Eliquis, and CHF on 2L [place] home oxygenwho presents with fever over the last couple of days with max temp of 101.1 with associated hematuria x 2 days. Patient found to have UTI. Patient presented with dysuria and fever. Also had increased dyspnea with a history of COPD, on 2 L of oxygen at baseline.Chest x-ray showed cardiomegaly with mild interstitial edema  Hospital course Urinary tract infection with hematuria:  Urine culture positive for gram-negative rods from 11/21, consistent with Klebsiella pneumoniae Patient started on Rocephin IV Final urine culture still pending at the time of discharge - Continue Rocephin IV No blood culture drawn on admission, blood culture from 11/22 still pending Patient has been afebrile for 48 hours Patient is being switched to Keflex to complete treatment of her urinary tract infection   Leukocytosis:  WBC elevated at 10.7. Improving, and Suspect that this is secondary to patient's acute infection as noted.      Acute on chronic diastolic heart failure. Last echocardiogram in 03/2015 showing EF of 55-60% with no wall motion abnormalities. Patient presents with a moderately elevated BNP of 284.1. Chest x-ray showing cardiomegaly with mild interstitial edema. Initiated on  40 mg of Lasix IV every 12 to wean oxygen to baseline use - Strict I&O's and Daily weights Repeat 2-D echo on  11/22 shows EF of  45-50%, mild-to-moderate aortic stenosis, moderate pulmonary hypertension Patient should follow-up with cardiology in the outpatient setting Resume home dose of Lasix, Zaroxolyn as needed  Patient has previously seen Dr. Marigene Ehlers  blood culture 11/22, no growth so far   COPD:  acute exacerbation. Patient with acute onset of wheezing shortness of breath, coughing, and increased oxygen requirements. Suspect this could be acute COPD exacerbation versus bronchitis vs CHF exacerbation.  Patient given 60 mg of by mouth prednisone in the emergency department. - Albuterol scheduled every 6 hours and when necessary every 2 hours - Continue medications of Spiriva - Changed Symbicort inhaler to Pulmicort and Brovana nebs   Not being discharged on steroids due to no wheezing  PAF (paroxysmal atrial fibrillation) s/p multiple DCCVs; on Amiodarone: Rate Controlled on amiodarone. Patient has a chadsvasc score of 5 and is on chronic anticoagulation of Eliquis. - Continue Eliquis, amiodarone, diltiazem  Essential Hypertension: Stable - Continue metoprolol, all other medications seen above  Diabetes mellitus type 2:Patient found to have a blood  glucose on admission of 499 no anion gap. Patient's last hemoglobin A1c was noted to be 8 back in August2016. - Hypoglycemic protocols - CBGs every before meals and at bedtime with moderatesliding scale insulin   Chronic renal disease, stage III: Stable. Creatinine appears to be at baseline around 1.5  - Continue to monitor  Iron deficiency anemia: Hemoglobin at 11g/dL and this appear near patient's last admission. Hemoglobin now 9.4 - Continue Ferrous Sulfate  Hyperlipidemia - Continue Vytorin  Morbid obesity- BMI 50  GERD - Pharmacy substitution of Protonix or omeprazole      Discharge Exam:   Blood pressure (!) 151/69, pulse 64, temperature 97.4 F (36.3 C), temperature source Oral, resp. rate 18, height 5' 5"  (1.651 m), weight (!)  137.4 kg (303 lb), SpO2 94 %.  Respiratory: Decreased overall breath sounds bilaterally. Some fine crackles appreciated, but no wheezing appreciated after breathing treatment.  Normal respiratory effort. No accessory muscle use.  Cardiovascular: Regular rate and rhythm, no murmurs / rubs / gallops. +1 pitting lower  extremity edema. 2+ pedal pulses. No carotid bruits.  Abdomen: no tenderness, no masses palpated. No hepatosplenomegaly. Bowel sounds positive.  Musculoskeletal: no clubbing / cyanosis. No joint deformity upper and lower extremities. Good ROM, no contractures. Normal muscle tone.  Skin: no rashes, lesions, ulcers. No induration Neurologic: CN 2-12 grossly intact. Sensation intact, DTR normal. Strength 5/5 in all     Follow-up Information    BURNETT,BRENT A, MD. Call.   Specialty:  Family Medicine Why:  Hospital follow-up Contact information: [address] [place] [address] [phone]           Signed: Reyne Dumas 10/20/2016, 9:54 AM        Time spent >45 mins

## 2016-10-23 LAB — CULTURE, BLOOD (ROUTINE X 2)
CULTURE: NO GROWTH
Culture: NO GROWTH

## 2016-10-24 DIAGNOSIS — N183 Chronic kidney disease, stage 3 (moderate): Secondary | ICD-10-CM | POA: Diagnosis not present

## 2016-10-24 DIAGNOSIS — E119 Type 2 diabetes mellitus without complications: Secondary | ICD-10-CM | POA: Diagnosis not present

## 2016-11-01 ENCOUNTER — Encounter (HOSPITAL_COMMUNITY): Payer: Self-pay

## 2016-11-01 ENCOUNTER — Ambulatory Visit (HOSPITAL_COMMUNITY)
Admission: RE | Admit: 2016-11-01 | Discharge: 2016-11-01 | Disposition: A | Discharge status: Home / Self Care | Payer: Medicare Other | Source: Ambulatory Visit | Attending: Cardiology | Admitting: Cardiology

## 2016-11-01 VITALS — BP 114/60 | HR 70 | Wt 308.0 lb

## 2016-11-01 DIAGNOSIS — Z794 Long term (current) use of insulin: Secondary | ICD-10-CM | POA: Insufficient documentation

## 2016-11-01 DIAGNOSIS — I700 Atherosclerosis of aorta: Secondary | ICD-10-CM | POA: Insufficient documentation

## 2016-11-01 DIAGNOSIS — I130 Hypertensive heart and chronic kidney disease with heart failure and stage 1 through stage 4 chronic kidney disease, or unspecified chronic kidney disease: Secondary | ICD-10-CM | POA: Diagnosis not present

## 2016-11-01 DIAGNOSIS — Z87891 Personal history of nicotine dependence: Secondary | ICD-10-CM | POA: Diagnosis not present

## 2016-11-01 DIAGNOSIS — I4891 Unspecified atrial fibrillation: Secondary | ICD-10-CM

## 2016-11-01 DIAGNOSIS — I350 Nonrheumatic aortic (valve) stenosis: Secondary | ICD-10-CM

## 2016-11-01 DIAGNOSIS — Z9981 Dependence on supplemental oxygen: Secondary | ICD-10-CM | POA: Diagnosis not present

## 2016-11-01 DIAGNOSIS — M109 Gout, unspecified: Secondary | ICD-10-CM | POA: Insufficient documentation

## 2016-11-01 DIAGNOSIS — I5032 Chronic diastolic (congestive) heart failure: Secondary | ICD-10-CM | POA: Diagnosis not present

## 2016-11-01 DIAGNOSIS — N183 Chronic kidney disease, stage 3 unspecified: Secondary | ICD-10-CM

## 2016-11-01 DIAGNOSIS — I480 Paroxysmal atrial fibrillation: Secondary | ICD-10-CM | POA: Diagnosis not present

## 2016-11-01 DIAGNOSIS — Z7901 Long term (current) use of anticoagulants: Secondary | ICD-10-CM | POA: Insufficient documentation

## 2016-11-01 DIAGNOSIS — E785 Hyperlipidemia, unspecified: Secondary | ICD-10-CM | POA: Insufficient documentation

## 2016-11-01 DIAGNOSIS — D649 Anemia, unspecified: Secondary | ICD-10-CM | POA: Insufficient documentation

## 2016-11-01 DIAGNOSIS — E1122 Type 2 diabetes mellitus with diabetic chronic kidney disease: Secondary | ICD-10-CM | POA: Insufficient documentation

## 2016-11-01 DIAGNOSIS — Z79899 Other long term (current) drug therapy: Secondary | ICD-10-CM | POA: Diagnosis not present

## 2016-11-01 DIAGNOSIS — J449 Chronic obstructive pulmonary disease, unspecified: Secondary | ICD-10-CM | POA: Diagnosis not present

## 2016-11-01 DIAGNOSIS — Z6841 Body Mass Index (BMI) 40.0 and over, adult: Secondary | ICD-10-CM | POA: Insufficient documentation

## 2016-11-01 MED ORDER — METOLAZONE 2.5 MG PO TABS: 2.5000 mg | ORAL_TABLET | ORAL | 6 refills | Status: DC

## 2016-11-01 NOTE — Progress Notes (Signed)
Patient ID: Paige Hardin, female   DOB: Dec 12, 1945, 70 y.o.   MRN: [id number] PCP: Dr. Haskel Schroeder Pulmonologist: Dr. Lamonte Sakai  HF Cardiologist: Aundra Dubin   70 yo with history of morbid obesity, COPD on home oxygen, paroxysmal atrial fibrillation, and chronic diastolic CHF presents for followup. She was admitted for several weeks from 3/15 to 4/15 with atrial fibrillation/RVR, acute on chronic diastolic CHF, and COPD exacerbation.  She ended up being cardioverted in the hospital.  She was diuresed with IV Lasix.  After the prolonged hospitalization, she was sent to [facility].  She was again admitted at the end of 4/15 and hospitalized into 5/15 with acute on chronic diastolic CHF. She was intubated this time and went back into atrial fibrillation with RVR.  She was cardioverted back to NSR and is maintained on amiodarone.  She was again diuresed and discharged to SNF [facility]).    Admitted 10/17/16 through 11/24/201 and treated for UTI and mild volume overload. Treated with IV antibiotics and transitioned to keflex. Diuresed with IV lasix and transitioned to lasix 160 mg twice a day. ECHO 09/2016 with EF 45-50% and Grade II DD. Discharge weight was 303 pounds.   She returns for post hospital follow up. She remains at [facility]. All medication provided at [facility]. SOB with exertion. Remains on 2 liters oxygen. Ambulates with a rolling walker. Weight at home 301-304 pounds. She has not had metolazone in a few months. Denies PND/orthopnea. Eating high salt foods such as pizza and hot dogs.    Labs (4/15): K 4.4, creatinine 1.4 Labs (5/15): creatinine 1.3, hemoglobin 8.5 Labs (04/30/14 ): K 3.6, creatinine 1.6 Labs (05/05/14) : K 4.4, creatinine 1.42, TSH 3.69, pro-BNP 999, AST 15, ALT 14 Labs (06/18/14): K 4.1, creatinine 1.55, AST 19, ALT 19 Labs (4/16): K 4.3, creatinine 1.33, HCT 36 Labs (5/16): K 4.2, creatinine 2.09, TSH normal, LFTs normal Labs (8/16): K 4.2, creatinine 1.35, TSH  normal Labs (10/20/2016): K 4.6 Creatinine 1.65.   1. Chronic low back pain.  2. COPD: On home oxygen. Prior smoker.  3. Type 2 diabetes.  4. Paroxysmal atrial fibrillation:  DCCV in 2010 and again in 4/15.  She is on warfarin and is on amiodarone to maintain NSR.  5. Hyperlipidemia.  6. Diastolic congestive heart failure: Echo (2/15) with EF 60%, mild MR, PA systolic pressure 53 mmHg. Echo (5/16) with mild LVH, EF 55-60%, mild MR, moderate biatrial enlargement.   7. Hypertension.  8. Low back pain 9. Obesity 10. CKD 11. Anemia: Fe deficiency 12. RLL nodule  13. ACEI cough 14. Suspected gout 15. ECHO 09/2016: Ef 45-50%. Mild/mod stenosis Grade II DD. Peak PA pressure 412 mg  SOCIAL HISTORY: Prior smoker.  Patient is currently in SNF [facility]).  FAMILY HISTORY: Non-Hodgkin lymphoma and breast cancer. No early-onset  coronary artery disease.   REVIEW OF SYSTEMS: Negative except as noted in the history of present  Illness.  Current Outpatient Prescriptions  Medication Sig Dispense Refill  . acetaminophen (TYLENOL) 325 MG tablet Take 325 mg by mouth daily as needed for mild pain.     Marland Kitchen albuterol (PROVENTIL HFA;VENTOLIN HFA) 108 (90 Base) MCG/ACT inhaler Inhale 2 puffs into the lungs every 6 (six) hours as needed for wheezing or shortness of breath.    Marland Kitchen albuterol (PROVENTIL) (2.5 MG/3ML) 0.083% nebulizer solution Take 2.5 mg by nebulization every 6 (six) hours as needed for wheezing or shortness of breath.    . allopurinol (ZYLOPRIM) 100 MG tablet Take  1 tablet (100 mg total) by mouth daily.    Marland Kitchen amiodarone (PACERONE) 200 MG tablet Take 1 tablet (200 mg total) by mouth daily.    Marland Kitchen apixaban (ELIQUIS) 5 MG TABS tablet Take 1 tablet (5 mg total) by mouth 2 (two) times daily. 60 tablet   . arformoterol (BROVANA) 15 MCG/2ML NEBU Take 2 mLs (15 mcg total) by nebulization 2 (two) times daily. 120 mL 1  . Ascorbic Acid (VITAMIN C) 500 MG CAPS Take 500 mg by mouth daily.     . benzonatate  (TESSALON) 200 MG capsule Take 1 capsule (200 mg total) by mouth 2 (two) times daily as needed for cough. 30 capsule 0  . budesonide-formoterol (SYMBICORT) 160-4.5 MCG/ACT inhaler Inhale 2 puffs into the lungs 2 (two) times daily. 1 Inhaler 5  . calcium-vitamin D (OSCAL WITH D) 500-200 MG-UNIT per tablet Take 1 tablet by mouth daily with breakfast.    . Cholecalciferol (VITAMIN D) 2000 UNITS tablet Take 2,000 Units by mouth daily.    Marland Kitchen diltiazem (CARDIZEM CD) 180 MG 24 hr capsule Take 1 capsule (180 mg total) by mouth daily. 30 capsule 3  . ezetimibe-simvastatin (VYTORIN) 10-40 MG per tablet Take 1 tablet by mouth at bedtime.     . ferrous sulfate 325 (65 FE) MG tablet Take 325 mg by mouth 2 (two) times daily with a meal.     . furosemide (LASIX) 80 MG tablet Take 2 tablets (160 mg total) by mouth 2 (two) times daily. MAKE SURE TO TAKE AT 8 AM AND 2 PM    . guaiFENesin (DIABETIC TUSSIN EX) 100 MG/5ML liquid Take 200 mg by mouth 3 (three) times daily as needed for cough.    . insulin aspart (NOVOLOG) 100 UNIT/ML injection Inject 5 Units into the skin 3 (three) times daily with meals. (Patient taking differently: Inject 14 Units into the skin 3 (three) times daily with meals. ) 10 mL 11  . insulin aspart (NOVOLOG) 100 UNIT/ML injection Inject 0-9 Units into the skin 3 (three) times daily with meals. Correction factor Sliding scale CBG 70 - 120: 0 units CBG 121 - 150: 1 unit,  CBG 151 - 200: 2 units,  CBG 201 - 250: 3 units,  CBG 251 - 300: 5 units,  CBG 301 - 350: 7 units,  CBG 351 - 400: 9 units   CBG > 400: 9 units and notify your MD 10 mL 11  . insulin NPH Human (HUMULIN N,NOVOLIN N) 100 UNIT/ML injection Inject 20 Units into the skin 2 (two) times daily before a meal.     . metoprolol (LOPRESSOR) 50 MG tablet Take 1 tablet (50 mg total) by mouth 2 (two) times daily. 60 tablet 3  . Multiple Vitamins-Iron (MULTIVITAMIN/IRON PO) Take 1 tablet by mouth daily.    Marland Kitchen omeprazole (PRILOSEC) 20 MG capsule  Take 20 mg by mouth daily.    . OXYGEN Inhale 2 L into the lungs continuous.    . potassium chloride SA (K-DUR,KLOR-CON) 20 MEQ tablet Take 20 mEq by mouth 3 (three) times daily. MAKE SURE TO TAKE EXTRA 20 MEQ ON METOLAZONE DAYS    . tiotropium (SPIRIVA) 18 MCG inhalation capsule Place 1 capsule (18 mcg total) into inhaler and inhale daily. 30 capsule 5  . vitamin B-12 (CYANOCOBALAMIN) 500 MCG tablet Take 500 mcg by mouth daily.    . metolazone (ZAROXOLYN) 2.5 MG tablet Take 1 tablet only if weight is greater or equal to 300 lbs as needed (Patient  not taking: Reported on 11/01/2016) 30 tablet 3   No current facility-administered medications for this encounter.     Vitals:   11/01/16 1103  BP: 114/60  Pulse: 70  SpO2: 94%  Weight: (!) 308 lb (139.7 kg)    General:  NAD, on 2L oxygen by [place]. Niece present.  Neck: Thick, JVP ~10 , no thyromegaly or thyroid nodule.  Lungs: Distant breath sounds bilaterally CV: Nondisplaced PMI.  Heart regular S1/S2, no S3/S4, 1/6 early SEM RUSB. 1+ ankle edema.  No carotid bruit.   Abdomen: Soft, nontender, no hepatosplenomegaly, no distention.  Skin: Intact without lesions or rashes.  Neurologic: Alert and oriented x 3.  Psych: Normal affect. Extremities: No clubbing or cyanosis. R and LLE 1-2+ edema.   Assessment/Plan:  1. Atrial fibrillation: Paroxysmal. She is in NSR today. Will continue amiodarone 200 mg daily and Eliquis 5 mg twice a day.  Check LFTs and TSH today.  Will need yearly eye exam.  She was evaluated by EP in hospital during her last admission, recommended against AV nodal ablation/pacing. Check CBC given anticoagulation.  2. Chronic diastolic CHF: EF 0000000 (echo 5/16).  Volume looks stable today.   - Continue lasix 160 mg BID, and add 2.5 mg metolazone weekly.    - Consider cardiomems.   - Reinforced the need and importance of daily weights, a low sodium diet, and fluid restriction (less than 2 L a day). Instructed to call the HF clinic  if weight increases more than 3 lbs overnight or 5 lbs in a week.  4. CKD stage III: Stable after stopping standing metolazone. Recheck BMET today.  5. Anemia: Per Dr Marin Olp.  6. COPD: Continues home oxygen.   7. OSA: Suspected, patient has typical body habitus and daytime sleepiness.  Sleep study was negative for sleep apnea.  8. Gout:  Quiescent 9. Aortic Stenosis- on ECHO mild valve area 1.26 cm Mean 18. Follow Repeat ECHO in 6 months.    Follow up in 2 months.    Shakeila Pfarr NP-C  11/01/2016

## 2016-11-01 NOTE — Patient Instructions (Addendum)
Take metolazone 2.5 mg tablet once every Wednesday morning.  Draw BMET next Wednesday (11/08/2016).  Follow up 2 months with Amy Clegg NP-C.  Do the following things EVERYDAY: 1) Weigh yourself in the morning before breakfast. Write it down and keep it in a log. 2) Take your medicines as prescribed 3) Eat low salt foods-Limit salt (sodium) to 2000 mg per day.  4) Stay as active as you can everyday 5) Limit all fluids for the day to less than 2 liters

## 2016-11-07 DIAGNOSIS — I5032 Chronic diastolic (congestive) heart failure: Secondary | ICD-10-CM | POA: Diagnosis not present

## 2016-11-09 ENCOUNTER — Telehealth (HOSPITAL_COMMUNITY): Payer: Self-pay | Admitting: Cardiology

## 2016-11-09 ENCOUNTER — Other Ambulatory Visit (HOSPITAL_COMMUNITY): Payer: Self-pay | Admitting: Cardiology

## 2016-11-09 NOTE — Telephone Encounter (Signed)
Abnormal labs received 11/07/16,  Cr 2.99  k 4.0  Per Rebecca Eaton hold metolazone and change to as needed for weight gain, 3lbs overnight or 5 lbs in a week.  Patient aware and voiced understanding. Unable to give verbal order faxed to [facility] at [phone]

## 2016-11-15 NOTE — Telephone Encounter (Signed)
[facility] called back regarding order sent over, they state they can not give PRN medications and need an order for when to give Metolazone.  Discussed w/Andy Tillery,PA, he advised for them to call our office for wt gain of 3 lb in 24 hour or 5 lb in a week and then we can advise to take metolazone, this order was faxed to them.  However they are now calling back stating they can not call pt's wts and would like the order to state for them to fax weights to our office for review weekly.  Ok per Oda Kilts, [place] he states ok for this, new order faxed back for them to fax weights weekly to our office.

## 2016-11-21 DIAGNOSIS — R600 Localized edema: Secondary | ICD-10-CM | POA: Diagnosis not present

## 2016-11-23 ENCOUNTER — Other Ambulatory Visit (HOSPITAL_BASED_OUTPATIENT_CLINIC_OR_DEPARTMENT_OTHER): Payer: Medicare Other

## 2016-11-23 ENCOUNTER — Ambulatory Visit (HOSPITAL_BASED_OUTPATIENT_CLINIC_OR_DEPARTMENT_OTHER): Payer: Medicare Other | Admitting: Family

## 2016-11-23 ENCOUNTER — Other Ambulatory Visit (HOSPITAL_COMMUNITY): Payer: Self-pay | Admitting: Cardiology

## 2016-11-23 VITALS — BP 130/58 | HR 68 | Temp 97.7°F | Resp 20 | Wt 312.0 lb

## 2016-11-23 DIAGNOSIS — N183 Chronic kidney disease, stage 3 unspecified: Secondary | ICD-10-CM

## 2016-11-23 DIAGNOSIS — D509 Iron deficiency anemia, unspecified: Secondary | ICD-10-CM

## 2016-11-23 DIAGNOSIS — D631 Anemia in chronic kidney disease: Secondary | ICD-10-CM | POA: Diagnosis not present

## 2016-11-23 LAB — COMPREHENSIVE METABOLIC PANEL
ALT: 17 U/L (ref 0–55)
AST: 20 U/L (ref 5–34)
Albumin: 3.6 g/dL (ref 3.5–5.0)
Alkaline Phosphatase: 80 U/L (ref 40–150)
Anion Gap: 12 mEq/L — ABNORMAL HIGH (ref 3–11)
BUN: 41.7 mg/dL — AB (ref 7.0–26.0)
CHLORIDE: 98 meq/L (ref 98–109)
CO2: 30 meq/L — AB (ref 22–29)
CREATININE: 1.5 mg/dL — AB (ref 0.6–1.1)
Calcium: 10 mg/dL (ref 8.4–10.4)
EGFR: 33 mL/min/{1.73_m2} — ABNORMAL LOW (ref >90)
GLUCOSE: 115 mg/dL (ref 70–140)
POTASSIUM: 4.3 meq/L (ref 3.5–5.1)
SODIUM: 141 meq/L (ref 136–145)
Total Bilirubin: 0.56 mg/dL (ref 0.20–1.20)
Total Protein: 7.9 g/dL (ref 6.4–8.3)

## 2016-11-23 LAB — CBC WITH DIFFERENTIAL (CANCER CENTER ONLY)
BASO#: 0 10*3/uL (ref 0.0–0.2)
BASO%: 0.2 % (ref 0.0–2.0)
EOS%: 1.6 % (ref 0.0–7.0)
Eosinophils Absolute: 0.2 10*3/uL (ref 0.0–0.5)
HCT: 36.7 % (ref 34.8–46.6)
HGB: 11.4 g/dL — ABNORMAL LOW (ref 11.6–15.9)
LYMPH#: 0.8 10*3/uL — AB (ref 0.9–3.3)
LYMPH%: 7.2 % — ABNORMAL LOW (ref 14.0–48.0)
MCH: 29.8 pg (ref 26.0–34.0)
MCHC: 31.1 g/dL — AB (ref 32.0–36.0)
MCV: 96 fL (ref 81–101)
MONO#: 0.8 10*3/uL (ref 0.1–0.9)
MONO%: 6.8 % (ref 0.0–13.0)
NEUT%: 84.2 % — AB (ref 39.6–80.0)
NEUTROS ABS: 9.3 10*3/uL — AB (ref 1.5–6.5)
Platelets: 266 10*3/uL (ref 145–400)
RBC: 3.82 10*6/uL (ref 3.70–5.32)
RDW: 17.1 % — AB (ref 11.1–15.7)
WBC: 11.1 10*3/uL — ABNORMAL HIGH (ref 3.9–10.0)

## 2016-11-23 LAB — FERRITIN: Ferritin: 215 ng/ml (ref 9–269)

## 2016-11-23 LAB — IRON AND TIBC
%SAT: 16 % — AB (ref 21–57)
Iron: 48 ug/dL (ref 41–142)
TIBC: 291 ug/dL (ref 236–444)
UIBC: 244 ug/dL (ref 120–384)

## 2016-11-23 NOTE — Progress Notes (Signed)
Hematology and Oncology Follow Up Visit  Paige Hardin [id number] 05-Mar-1946 70 y.o. 11/23/2016   Principle Diagnosis:  Anemia secondary to iron deficiency  Anemia secondary to renal insufficiency  Chronic anticoagulation for paroxysmal atrial fibrillation  Current Therapy:   IV iron as indicated - last received in June 2015 Aranesp 300 mcg subcutaneous as needed for hemoglobin less than 11  ELIQUIS-long-term    Interim History: Paige Hardin is here today for a follow-up. She is feeling fatigued. She has noticed that her O2 sat has been dropping to 77% when she ambulates to the dining room and her SOB worsens. She is currently on 2L [place] supplemental O2 24 hours a day. Her O2 sat at this time is 90% on 2L. I advised that she notify her pulmonologist Dr. Lamonte Sakai of these episodes. She plans to call today.  She has had no falls or syncopal episodes. She was hospitalized a month ago for a UTI. This has since resolved and she denies having any symptoms of recurrence.  No fever, chills, n/v, cough, rash, headaches, dizziness, chest pain, palpitations, abdominal pain or changes in her bowel or bladder habits.  She has done well on Eliquis for her atrial fib and has had no episodes of bleeding or petechiae. She does bruise easily.  No tenderness, numbness or tingling in her extremities. She has puffiness in her legs and feet that comes and goes. She is on diuretic regimen with lasix and also has Zaroxolyn to take as needed.  She has maintained a good appetite and is staying hydrated. Her weight is up another 5 lbs since her last visit.   Medications:  Allergies as of 11/23/2016      Reactions   Ace Inhibitors Cough   Clindamycin Rash      Medication List       Accurate as of 11/23/16 10:45 AM. Always use your most recent med list.          acetaminophen 325 MG tablet Commonly known as:  TYLENOL Take 325 mg by mouth daily as needed for mild pain.   albuterol (2.5 MG/3ML) 0.083%  nebulizer solution Commonly known as:  PROVENTIL Take 2.5 mg by nebulization every 6 (six) hours as needed for wheezing or shortness of breath.   albuterol 108 (90 Base) MCG/ACT inhaler Commonly known as:  PROVENTIL HFA;VENTOLIN HFA Inhale 2 puffs into the lungs every 6 (six) hours as needed for wheezing or shortness of breath.   allopurinol 100 MG tablet Commonly known as:  ZYLOPRIM Take 1 tablet (100 mg total) by mouth daily.   amiodarone 200 MG tablet Commonly known as:  PACERONE Take 1 tablet (200 mg total) by mouth daily.   apixaban 5 MG Tabs tablet Commonly known as:  ELIQUIS Take 1 tablet (5 mg total) by mouth 2 (two) times daily.   arformoterol 15 MCG/2ML Nebu Commonly known as:  BROVANA Take 2 mLs (15 mcg total) by nebulization 2 (two) times daily.   benzonatate 200 MG capsule Commonly known as:  TESSALON Take 1 capsule (200 mg total) by mouth 2 (two) times daily as needed for cough.   budesonide-formoterol 160-4.5 MCG/ACT inhaler Commonly known as:  SYMBICORT Inhale 2 puffs into the lungs 2 (two) times daily.   calcium-vitamin D 500-200 MG-UNIT tablet Commonly known as:  OSCAL WITH D Take 1 tablet by mouth daily with breakfast.   DIABETIC TUSSIN EX 100 MG/5ML liquid Generic drug:  guaiFENesin Take 200 mg by mouth 3 (three) times daily as  needed for cough.   diltiazem 180 MG 24 hr capsule Commonly known as:  CARDIZEM CD Take 1 capsule (180 mg total) by mouth daily.   ezetimibe-simvastatin 10-40 MG tablet Commonly known as:  VYTORIN Take 1 tablet by mouth at bedtime.   ferrous sulfate 325 (65 FE) MG tablet Take 325 mg by mouth 2 (two) times daily with a meal.   furosemide 80 MG tablet Commonly known as:  LASIX Take 2 tablets (160 mg total) by mouth 2 (two) times daily. MAKE SURE TO TAKE AT 8 AM AND 2 PM   insulin aspart 100 UNIT/ML injection Commonly known as:  novoLOG Inject 5 Units into the skin 3 (three) times daily with meals.   insulin aspart  100 UNIT/ML injection Commonly known as:  novoLOG Inject 0-9 Units into the skin 3 (three) times daily with meals. Correction factor Sliding scale CBG 70 - 120: 0 units CBG 121 - 150: 1 unit,  CBG 151 - 200: 2 units,  CBG 201 - 250: 3 units,  CBG 251 - 300: 5 units,  CBG 301 - 350: 7 units,  CBG 351 - 400: 9 units   CBG > 400: 9 units and notify your MD   insulin NPH Human 100 UNIT/ML injection Commonly known as:  HUMULIN N,NOVOLIN N Inject 20 Units into the skin 2 (two) times daily before a meal.   metolazone 2.5 MG tablet Commonly known as:  ZAROXOLYN Take 1 tablet (2.5 mg total) by mouth once a week. Wednesday mornings.   metoprolol 50 MG tablet Commonly known as:  LOPRESSOR Take 1 tablet (50 mg total) by mouth 2 (two) times daily.   MULTIVITAMIN/IRON PO Take 1 tablet by mouth daily.   omeprazole 20 MG capsule Commonly known as:  PRILOSEC Take 20 mg by mouth daily.   OXYGEN Inhale 2 L into the lungs continuous.   potassium chloride SA 20 MEQ tablet Commonly known as:  K-DUR,KLOR-CON Take 20 mEq by mouth 3 (three) times daily. MAKE SURE TO TAKE EXTRA 20 MEQ ON METOLAZONE DAYS   tiotropium 18 MCG inhalation capsule Commonly known as:  SPIRIVA Place 1 capsule (18 mcg total) into inhaler and inhale daily.   vitamin B-12 500 MCG tablet Commonly known as:  CYANOCOBALAMIN Take 500 mcg by mouth daily.   Vitamin C 500 MG Caps Take 500 mg by mouth daily.   Vitamin D 2000 units tablet Take 2,000 Units by mouth daily.       Allergies:  Allergies  Allergen Reactions  . Ace Inhibitors Cough  . Clindamycin Rash    Past Medical History, Surgical history, Social history, and Family History were reviewed and updated.  Review of Systems: All other 10 point review of systems is negative.   Physical Exam:  vitals were not taken for this visit.  Wt Readings from Last 3 Encounters:  11/01/16 (!) 308 lb (139.7 kg)  10/20/16 (!) 303 lb (137.4 kg)  07/25/16 (!) 306 lb (138.8  kg)    Ocular: Sclerae unicteric, pupils equal, round and reactive to light Ear-nose-throat: Oropharynx clear, dentition fair Lymphatic: No cervical supraclavicular or axillary adenopathy Lungs no rales or rhonchi, good excursion bilaterally Heart regular rate and rhythm, no murmur appreciated Abd soft, nontender, positive bowel sounds, no liver or spleen tip palpated on exam MSK no focal spinal tenderness, no joint edema Neuro: non-focal, well-oriented, appropriate affect Breasts: Deferred  Lab Results  Component Value Date   WBC 11.1 (H) 11/23/2016   HGB 11.4 (L)  11/23/2016   HCT 36.7 11/23/2016   MCV 96 11/23/2016   PLT 266 11/23/2016   Lab Results  Component Value Date   FERRITIN 150 07/25/2016   IRON 59 07/25/2016   TIBC 285 07/25/2016   UIBC 226 07/25/2016   IRONPCTSAT 21 07/25/2016   Lab Results  Component Value Date   RETICCTPCT 2.3 09/21/2015   RBC 3.82 11/23/2016   RETICCTABS 89.0 09/21/2015   No results found for: KPAFRELGTCHN, LAMBDASER, KAPLAMBRATIO No results found for: IGGSERUM, IGA, IGMSERUM No results found for: Odetta Pink, SPEI   Chemistry      Component Value Date/Time   NA 137 10/20/2016 0322   NA 140 07/25/2016 1047   K 4.6 10/20/2016 0322   K 3.9 07/25/2016 1047   CL 98 (L) 10/20/2016 0322   CL 96 (L) 12/22/2014 1019   CO2 30 10/20/2016 0322   CO2 30 (H) 07/25/2016 1047   BUN 40 (H) 10/20/2016 0322   BUN 47.5 (H) 07/25/2016 1047   CREATININE 1.65 (H) 10/20/2016 0322   CREATININE 1.7 (H) 07/25/2016 1047      Component Value Date/Time   CALCIUM 9.2 10/20/2016 0322   CALCIUM 9.9 07/25/2016 1047   ALKPHOS 54 10/19/2016 0355   ALKPHOS 81 07/25/2016 1047   AST 22 10/19/2016 0355   AST 15 07/25/2016 1047   ALT 17 10/19/2016 0355   ALT 16 07/25/2016 1047   BILITOT 0.6 10/19/2016 0355   BILITOT 0.37 07/25/2016 1047     Impression and Plan: Paige Hardin is 70 year old [place] female with  multifactorial anemia (iron deficiency/chronic renal insufficiency). She has not required IV iron in over 2 years. She is symptomatic with fatigue at this time. She has multiple health issues including CHF and COPD. Her CBC today is stable. We will see what her iron studies show and  bring her back in for infusion next week if needed.  She states that she will notify pulmonology of her increased SOB and decreased O2 saturation with activity today.  We will go ahead and plan to see her back in 4 months for repeat lab work and follow-up.  She will contact our office with any questions or concerns. We can certainly see her sooner if need be.    Paige Bottom, Paige Hardin 12/28/201710:45 AM

## 2016-11-24 ENCOUNTER — Telehealth: Payer: Self-pay | Admitting: *Deleted

## 2016-11-24 NOTE — Telephone Encounter (Addendum)
Patient aware of results. Appointment made  ----- Message from Eliezer Bottom, NP sent at 11/23/2016 11:04 PM EST ----- Regarding: Iron Iron saturation low. She will need one dose of Feraheme next week please. Thank you!  Paige Hardin  ----- Message ----- From: Interface, Lab In Three Zero One Sent: 11/23/2016  10:34 AM To: Eliezer Bottom, NP

## 2016-11-28 ENCOUNTER — Ambulatory Visit (HOSPITAL_BASED_OUTPATIENT_CLINIC_OR_DEPARTMENT_OTHER): Payer: Medicare Other

## 2016-11-28 ENCOUNTER — Other Ambulatory Visit: Payer: Self-pay | Admitting: Family

## 2016-11-28 VITALS — BP 109/44 | HR 70 | Temp 97.5°F | Resp 22

## 2016-11-28 DIAGNOSIS — D508 Other iron deficiency anemias: Secondary | ICD-10-CM

## 2016-11-28 MED ORDER — SODIUM CHLORIDE 0.9 % IV SOLN
510.0000 mg | Freq: Once | INTRAVENOUS | Status: AC
  Administered 2016-11-28: 510 mg via INTRAVENOUS
  Filled 2016-11-28: qty 17

## 2016-11-28 NOTE — Patient Instructions (Signed)
Ferumoxytol injection What is this medicine? FERUMOXYTOL is an iron complex. Iron is used to make healthy red blood cells, which carry oxygen and nutrients throughout the body. This medicine is used to treat iron deficiency anemia in people with chronic kidney disease. COMMON BRAND NAME(S): Feraheme What should I tell my health care provider before I take this medicine? They need to know if you have any of these conditions: -anemia not caused by low iron levels -high levels of iron in the blood -magnetic resonance imaging (MRI) test scheduled -an unusual or allergic reaction to iron, other medicines, foods, dyes, or preservatives -pregnant or trying to get pregnant -breast-feeding How should I use this medicine? This medicine is for injection into a vein. It is given by a health care professional in a hospital or clinic setting. Talk to your pediatrician regarding the use of this medicine in children. Special care may be needed. What if I miss a dose? It is important not to miss your dose. Call your doctor or health care professional if you are unable to keep an appointment. What may interact with this medicine? This medicine may interact with the following medications: -other iron products What should I watch for while using this medicine? Visit your doctor or healthcare professional regularly. Tell your doctor or healthcare professional if your symptoms do not start to get better or if they get worse. You may need blood work done while you are taking this medicine. You may need to follow a special diet. Talk to your doctor. Foods that contain iron include: whole grains/cereals, dried fruits, beans, or peas, leafy green vegetables, and organ meats (liver, kidney). What side effects may I notice from receiving this medicine? Side effects that you should report to your doctor or health care professional as soon as possible: -allergic reactions like skin rash, itching or hives, swelling of the  face, lips, or tongue -breathing problems -changes in blood pressure -feeling faint or lightheaded, falls -fever or chills -flushing, sweating, or hot feelings -swelling of the ankles or feet Side effects that usually do not require medical attention (report to your doctor or health care professional if they continue or are bothersome): -diarrhea -headache -nausea, vomiting -stomach pain Where should I keep my medicine? This drug is given in a hospital or clinic and will not be stored at home.  2017 Elsevier/Gold Standard (2015-12-16 12:41:49)  

## 2016-12-04 DIAGNOSIS — L84 Corns and callosities: Secondary | ICD-10-CM | POA: Diagnosis not present

## 2016-12-04 DIAGNOSIS — B351 Tinea unguium: Secondary | ICD-10-CM | POA: Diagnosis not present

## 2016-12-27 ENCOUNTER — Other Ambulatory Visit (HOSPITAL_COMMUNITY): Payer: Self-pay | Admitting: Internal Medicine

## 2017-01-02 ENCOUNTER — Ambulatory Visit (HOSPITAL_COMMUNITY)
Admission: RE | Admit: 2017-01-02 | Discharge: 2017-01-02 | Disposition: A | Discharge status: Home / Self Care | Payer: Medicare Other | Source: Ambulatory Visit | Attending: Internal Medicine | Admitting: Internal Medicine

## 2017-01-02 VITALS — BP 142/56 | HR 66 | Wt 307.2 lb

## 2017-01-02 DIAGNOSIS — E785 Hyperlipidemia, unspecified: Secondary | ICD-10-CM | POA: Insufficient documentation

## 2017-01-02 DIAGNOSIS — N183 Chronic kidney disease, stage 3 unspecified: Secondary | ICD-10-CM

## 2017-01-02 DIAGNOSIS — E1122 Type 2 diabetes mellitus with diabetic chronic kidney disease: Secondary | ICD-10-CM | POA: Insufficient documentation

## 2017-01-02 DIAGNOSIS — I10 Essential (primary) hypertension: Secondary | ICD-10-CM

## 2017-01-02 DIAGNOSIS — Z794 Long term (current) use of insulin: Secondary | ICD-10-CM | POA: Diagnosis not present

## 2017-01-02 DIAGNOSIS — M109 Gout, unspecified: Secondary | ICD-10-CM | POA: Diagnosis not present

## 2017-01-02 DIAGNOSIS — J449 Chronic obstructive pulmonary disease, unspecified: Secondary | ICD-10-CM | POA: Diagnosis not present

## 2017-01-02 DIAGNOSIS — D649 Anemia, unspecified: Secondary | ICD-10-CM | POA: Diagnosis not present

## 2017-01-02 DIAGNOSIS — Z7901 Long term (current) use of anticoagulants: Secondary | ICD-10-CM | POA: Diagnosis not present

## 2017-01-02 DIAGNOSIS — J438 Other emphysema: Secondary | ICD-10-CM

## 2017-01-02 DIAGNOSIS — I480 Paroxysmal atrial fibrillation: Secondary | ICD-10-CM

## 2017-01-02 DIAGNOSIS — G4733 Obstructive sleep apnea (adult) (pediatric): Secondary | ICD-10-CM | POA: Diagnosis not present

## 2017-01-02 DIAGNOSIS — I130 Hypertensive heart and chronic kidney disease with heart failure and stage 1 through stage 4 chronic kidney disease, or unspecified chronic kidney disease: Secondary | ICD-10-CM | POA: Diagnosis not present

## 2017-01-02 DIAGNOSIS — Z87891 Personal history of nicotine dependence: Secondary | ICD-10-CM | POA: Diagnosis not present

## 2017-01-02 DIAGNOSIS — Z79899 Other long term (current) drug therapy: Secondary | ICD-10-CM | POA: Diagnosis not present

## 2017-01-02 DIAGNOSIS — Z9981 Dependence on supplemental oxygen: Secondary | ICD-10-CM | POA: Insufficient documentation

## 2017-01-02 DIAGNOSIS — I350 Nonrheumatic aortic (valve) stenosis: Secondary | ICD-10-CM | POA: Insufficient documentation

## 2017-01-02 DIAGNOSIS — I5032 Chronic diastolic (congestive) heart failure: Secondary | ICD-10-CM | POA: Diagnosis not present

## 2017-01-02 LAB — BASIC METABOLIC PANEL
ANION GAP: 11 (ref 5–15)
BUN: 42 mg/dL — ABNORMAL HIGH (ref 6–20)
CALCIUM: 9.6 mg/dL (ref 8.9–10.3)
CO2: 30 mmol/L (ref 22–32)
Chloride: 96 mmol/L — ABNORMAL LOW (ref 101–111)
Creatinine, Ser: 1.67 mg/dL — ABNORMAL HIGH (ref 0.44–1.00)
GFR calc Af Amer: 34 mL/min — ABNORMAL LOW (ref >60)
GFR calc non Af Amer: 30 mL/min — ABNORMAL LOW (ref >60)
GLUCOSE: 167 mg/dL — AB (ref 65–99)
POTASSIUM: 4.3 mmol/L (ref 3.5–5.1)
SODIUM: 137 mmol/L (ref 135–145)

## 2017-01-02 LAB — BRAIN NATRIURETIC PEPTIDE: B NATRIURETIC PEPTIDE 5: 239.8 pg/mL — AB (ref 0.0–100.0)

## 2017-01-02 MED ORDER — METOLAZONE 2.5 MG PO TABS: 2.5000 mg | ORAL_TABLET | ORAL | 3 refills | Status: DC | PRN

## 2017-01-02 NOTE — Progress Notes (Signed)
Advanced Heart Failure Medication Review by a Pharmacist  Does the patient  feel that his/her medications are working for him/her?  yes  Has the patient been experiencing any side effects to the medications prescribed?  no  Does the patient measure his/her own blood pressure or blood glucose at home?  yes   Does the patient have any problems obtaining medications due to transportation or finances?   no  Understanding of regimen: good Understanding of indications: good Potential of compliance: good Patient understands to avoid NSAIDs. Patient understands to avoid decongestants.  Issues to address at subsequent visits: None   Pharmacist comments: Paige Hardin is a pleasant 71 yo F presenting from [name] with a current MAR. No significant discrepancies noted except PRN metolazone was not on the list.   Doroteo Bradford K. Velva Harman, PharmD, BCPS, CPP Clinical Pharmacist Pager: [phone] Phone: [phone] 01/02/2017 10:53 AM      Time with patient: 2 minutes Preparation and documentation time: 10 minutes Total time: 12 minutes

## 2017-01-02 NOTE — Patient Instructions (Addendum)
Routine lab work today. Will notify you of abnormal results, otherwise no news is good news!  Take metolazone 2.5 mg tablet as needed (up to once weekly) for weight gain/swelling.  Follow up with Dr. Lamonte Sakai (pulmonary care).  Follow up with Dr. Aundra Dubin and echocardiogram in 3 months.  Do the following things EVERYDAY: 1) Weigh yourself in the morning before breakfast. Write it down and keep it in a log. 2) Take your medicines as prescribed 3) Eat low salt foods-Limit salt (sodium) to 2000 mg per day.  4) Stay as active as you can everyday 5) Limit all fluids for the day to less than 2 liters

## 2017-01-02 NOTE — Progress Notes (Signed)
Patient ID: Paige Hardin, female   DOB: Mar 28, 1946, 71 y.o.   MRN: [id number]     Advanced Heart Failure Clinic Note   PCP: Dr. Haskel Schroeder Pulmonologist: Dr. Lamonte Sakai  HF Cardiologist: Aundra Dubin   71 yo with history of morbid obesity, COPD on home oxygen, paroxysmal atrial fibrillation, and chronic diastolic CHF presents for followup. She was admitted for several weeks from 3/15 to 4/15 with atrial fibrillation/RVR, acute on chronic diastolic CHF, and COPD exacerbation.  She ended up being cardioverted in the hospital.  She was diuresed with IV Lasix.  After the prolonged hospitalization, she was sent to [facility].  She was again admitted at the end of 4/15 and hospitalized into 5/15 with acute on chronic diastolic CHF. She was intubated this time and went back into atrial fibrillation with RVR.  She was cardioverted back to NSR and is maintained on amiodarone.  She was again diuresed and discharged to SNF [facility]).    Admitted 10/17/16 through 11/24/201 and treated for UTI and mild volume overload. Treated with IV antibiotics and transitioned to keflex. Diuresed with IV lasix and transitioned to lasix 160 mg twice a day. ECHO 09/2016 with EF 45-50% and Grade II DD. Discharge weight was 303 pounds.   She returns today for regular follow up. Currently resides in SNF at [facility]. Weight at facility stable 300-303. Has a little more SOB for the past few months.  Her 02 level has been dropping down into 70s with exertion with 2 L 02 continuously. Hasn't followed up with Dr. Lamonte Sakai since last fall.  Taking metolazone every 2 weeks or so (as needed). Does occasionally have PND.  Sleeps in a recliner chronically. Has really been watching her salt and fluid in take.  Labs (4/15): K 4.4, creatinine 1.4 Labs (5/15): creatinine 1.3, hemoglobin 8.5 Labs (04/30/14 ): K 3.6, creatinine 1.6 Labs (05/05/14) : K 4.4, creatinine 1.42, TSH 3.69, pro-BNP 999, AST 15, ALT 14 Labs (06/18/14): K 4.1, creatinine 1.55, AST  19, ALT 19 Labs (4/16): K 4.3, creatinine 1.33, HCT 36 Labs (5/16): K 4.2, creatinine 2.09, TSH normal, LFTs normal Labs (8/16): K 4.2, creatinine 1.35, TSH normal Labs (10/20/2016): K 4.6 Creatinine 1.65.   1. Chronic low back pain.  2. COPD: On home oxygen. Prior smoker.  3. Type 2 diabetes.  4. Paroxysmal atrial fibrillation:  DCCV in 2010 and again in 4/15.  She is on warfarin and is on amiodarone to maintain NSR.  5. Hyperlipidemia.  6. Diastolic congestive heart failure: Echo (2/15) with EF 60%, mild MR, PA systolic pressure 53 mmHg. Echo (5/16) with mild LVH, EF 55-60%, mild MR, moderate biatrial enlargement.   7. Hypertension.  8. Low back pain 9. Obesity 10. CKD 11. Anemia: Fe deficiency 12. RLL nodule  13. ACEI cough 14. Suspected gout 15. ECHO 09/2016: Ef 45-50%. Mild/mod stenosis Grade II DD. Peak PA pressure 412 mg  SOCIAL HISTORY: Prior smoker.  Patient is currently in SNF [facility]).  FAMILY HISTORY: Non-Hodgkin lymphoma and breast cancer. No early-onset  coronary artery disease.   REVIEW OF SYSTEMS: Negative except as noted in the history of present  Illness.  Current Outpatient Prescriptions  Medication Sig Dispense Refill  . acetaminophen (TYLENOL) 325 MG tablet Take 325 mg by mouth daily as needed for mild pain.     Marland Kitchen albuterol (PROVENTIL HFA;VENTOLIN HFA) 108 (90 Base) MCG/ACT inhaler Inhale 2 puffs into the lungs every 6 (six) hours as needed for wheezing or shortness of breath.    Marland Kitchen  albuterol (PROVENTIL) (2.5 MG/3ML) 0.083% nebulizer solution Take 2.5 mg by nebulization every 6 (six) hours as needed for wheezing or shortness of breath.    . allopurinol (ZYLOPRIM) 100 MG tablet Take 1 tablet (100 mg total) by mouth daily.    Marland Kitchen amiodarone (PACERONE) 200 MG tablet Take 1 tablet (200 mg total) by mouth daily.    Marland Kitchen apixaban (ELIQUIS) 5 MG TABS tablet Take 1 tablet (5 mg total) by mouth 2 (two) times daily. 60 tablet   . arformoterol (BROVANA) 15 MCG/2ML NEBU  Take 2 mLs (15 mcg total) by nebulization 2 (two) times daily. 120 mL 1  . Ascorbic Acid (VITAMIN C) 500 MG CAPS Take 500 mg by mouth daily.     . benzonatate (TESSALON) 200 MG capsule Take 1 capsule (200 mg total) by mouth 2 (two) times daily as needed for cough. 30 capsule 0  . budesonide-formoterol (SYMBICORT) 160-4.5 MCG/ACT inhaler Inhale 2 puffs into the lungs 2 (two) times daily. 1 Inhaler 5  . calcium-vitamin D (OSCAL WITH D) 500-200 MG-UNIT per tablet Take 1 tablet by mouth daily with breakfast.    . Cholecalciferol (VITAMIN D) 2000 UNITS tablet Take 2,000 Units by mouth daily.    Marland Kitchen diltiazem (CARDIZEM CD) 180 MG 24 hr capsule Take 1 capsule (180 mg total) by mouth daily. 30 capsule 3  . ezetimibe-simvastatin (VYTORIN) 10-40 MG per tablet Take 1 tablet by mouth at bedtime.     . ferrous sulfate 325 (65 FE) MG tablet Take 325 mg by mouth 2 (two) times daily with a meal.     . furosemide (LASIX) 80 MG tablet Take 2 tablets (160 mg total) by mouth 2 (two) times daily. MAKE SURE TO TAKE AT 8 AM AND 2 PM    . guaiFENesin (DIABETIC TUSSIN EX) 100 MG/5ML liquid Take 200 mg by mouth 3 (three) times daily as needed for cough.    . insulin aspart (NOVOLOG) 100 UNIT/ML injection Inject 5 Units into the skin 3 (three) times daily with meals. (Patient taking differently: Inject 16 Units into the skin 3 (three) times daily with meals. ) 10 mL 11  . insulin aspart (NOVOLOG) 100 UNIT/ML injection Inject 0-9 Units into the skin 3 (three) times daily with meals. Correction factor Sliding scale CBG 70 - 120: 0 units CBG 121 - 150: 1 unit,  CBG 151 - 200: 2 units,  CBG 201 - 250: 3 units,  CBG 251 - 300: 5 units,  CBG 301 - 350: 7 units,  CBG 351 - 400: 9 units   CBG > 400: 9 units and notify your MD 10 mL 11  . insulin NPH Human (HUMULIN N,NOVOLIN N) 100 UNIT/ML injection Inject 20 Units into the skin 2 (two) times daily before a meal.     . metolazone (ZAROXOLYN) 2.5 MG tablet Take 1 tablet (2.5 mg total) by  mouth once a week. Wednesday mornings. (Patient taking differently: Take 2.5 mg by mouth as needed. Wednesday mornings.) 5 tablet 6  . metoprolol (LOPRESSOR) 50 MG tablet Take 1 tablet (50 mg total) by mouth 2 (two) times daily. 60 tablet 3  . Multiple Vitamins-Iron (MULTIVITAMIN/IRON PO) Take 1 tablet by mouth daily.    Marland Kitchen omeprazole (PRILOSEC) 20 MG capsule Take 20 mg by mouth daily.    . OXYGEN Inhale 2 L into the lungs continuous.    . potassium chloride SA (K-DUR,KLOR-CON) 20 MEQ tablet Take 20 mEq by mouth 3 (three) times daily. MAKE SURE TO TAKE  EXTRA 20 MEQ ON METOLAZONE DAYS    . tiotropium (SPIRIVA) 18 MCG inhalation capsule Place 1 capsule (18 mcg total) into inhaler and inhale daily. 30 capsule 5  . vitamin B-12 (CYANOCOBALAMIN) 500 MCG tablet Take 500 mcg by mouth daily.     No current facility-administered medications for this encounter.     Vitals:   01/02/17 1047  BP: (!) 142/56  BP Location: Right Wrist  Patient Position: Sitting  Cuff Size: Normal  Pulse: 66  SpO2: 90%  Weight: (!) 307 lb 3.2 oz (139.3 kg)   Wt Readings from Last 3 Encounters:  01/02/17 (!) 307 lb 3.2 oz (139.3 kg)  11/23/16 (!) 312 lb (141.5 kg)  11/01/16 (!) 308 lb (139.7 kg)    General:  NAD. On 2L 02 via [place]. Niece present.   Neck: Thick, JVP ~7-8 cm, no thyromegaly or thyroid nodule.  Lungs: Decreased throughout.  CV: Nondisplaced PMI.  Heart regular S1/S2, no S3/S4, 1/6 early SEM RUSB.  No carotid bruit.   Abdomen: Soft, NT, ND, no HSM. No bruits or masses. +BS  Skin: Intact without lesions or rashes.  Neurologic: Alert and oriented x 3.  Psych: Normal affect. Extremities: No clubbing or cyanosis. Trace to 1 + ankle edema bilaterally.  Assessment/Plan:  1. Atrial fibrillation: Paroxysmal.  - NSR by exam today. - Continue amiodarone 200 mg daily. And Eliquis 5 mg BID.  - LFTs and TSH stable recently.  - Needs yearly eye exams.  2. Chronic diastolic CHF: EF 0000000 (echo 5/16).  Volume  looks stable today.   - Continue lasix 160 mg BID, and add 2.5 mg metolazone weekly.    - Consider cardiomems.   - Reinforced the need and importance of daily weights, a low sodium diet, and fluid restriction (less than 2 L a day). Instructed to call the HF clinic if weight increases more than 3 lbs overnight or 5 lbs in a week.  4. CKD stage III:  - BMET today.   5. Anemia: Per Dr Marin Olp.  6. COPD: Continues home oxygen.  Has increased 02 requirements - Recommended she follow up with Pulm 7. OSA:  - Sleep study negative.  ? OHS. Pt has typical body habitus + daytime sleepiness.  8. Gout:  Quiescent 9. Aortic Stenosis- on ECHO mild valve area 1.26 cm Mean 18.  - Will follow up with repeat Echo in 3 months.   10. Morbid Obesity - Encouraged to lose weight and increase activity as able.   Labs as above. Follow up 3 months with Dr. Aundra Dubin with Echo.    Shirley Friar, PA-C  01/02/2017

## 2017-01-04 ENCOUNTER — Telehealth (HOSPITAL_COMMUNITY): Payer: Self-pay | Admitting: *Deleted

## 2017-01-04 DIAGNOSIS — I5032 Chronic diastolic (congestive) heart failure: Secondary | ICD-10-CM

## 2017-01-04 MED ORDER — METOLAZONE 2.5 MG PO TABS: ORAL_TABLET | ORAL | 3 refills | Status: DC

## 2017-01-04 NOTE — Telephone Encounter (Signed)
Received fax from [facility], they are unable to do PRN Metolazone and need exact dosing instructions.  Per Oda Kilts, PA "Metolazone 2.5 mg every other week on Wed"  Order faxed back to them at [phone], med list updated

## 2017-01-23 DIAGNOSIS — Z6841 Body Mass Index (BMI) 40.0 and over, adult: Secondary | ICD-10-CM | POA: Diagnosis not present

## 2017-01-23 DIAGNOSIS — R609 Edema, unspecified: Secondary | ICD-10-CM | POA: Diagnosis not present

## 2017-01-23 DIAGNOSIS — E1165 Type 2 diabetes mellitus with hyperglycemia: Secondary | ICD-10-CM | POA: Diagnosis not present

## 2017-01-23 DIAGNOSIS — Z794 Long term (current) use of insulin: Secondary | ICD-10-CM | POA: Diagnosis not present

## 2017-02-06 ENCOUNTER — Ambulatory Visit: Payer: Federal, State, Local not specified - PPO | Admitting: Emergency Medicine

## 2017-03-07 DIAGNOSIS — E119 Type 2 diabetes mellitus without complications: Secondary | ICD-10-CM | POA: Diagnosis not present

## 2017-03-21 ENCOUNTER — Telehealth (HOSPITAL_COMMUNITY): Payer: Self-pay | Admitting: *Deleted

## 2017-03-21 NOTE — Telephone Encounter (Signed)
Received fax from [facility] with pt's wts:  4/7  297.6 4/8  298.4 4/9  298.4 4/10 298 4/11 299.4 4/12 299.2 4/13 297.8  Wt's stable.

## 2017-03-27 ENCOUNTER — Ambulatory Visit (HOSPITAL_BASED_OUTPATIENT_CLINIC_OR_DEPARTMENT_OTHER): Payer: Medicare Other | Admitting: Family

## 2017-03-27 ENCOUNTER — Other Ambulatory Visit (HOSPITAL_BASED_OUTPATIENT_CLINIC_OR_DEPARTMENT_OTHER): Payer: Medicare Other

## 2017-03-27 VITALS — BP 117/37 | HR 63 | Temp 98.2°F | Resp 20 | Wt 298.1 lb

## 2017-03-27 DIAGNOSIS — D631 Anemia in chronic kidney disease: Secondary | ICD-10-CM | POA: Diagnosis not present

## 2017-03-27 DIAGNOSIS — N183 Chronic kidney disease, stage 3 unspecified: Secondary | ICD-10-CM

## 2017-03-27 DIAGNOSIS — Z7901 Long term (current) use of anticoagulants: Secondary | ICD-10-CM | POA: Diagnosis not present

## 2017-03-27 DIAGNOSIS — D508 Other iron deficiency anemias: Secondary | ICD-10-CM

## 2017-03-27 DIAGNOSIS — D509 Iron deficiency anemia, unspecified: Secondary | ICD-10-CM | POA: Diagnosis present

## 2017-03-27 LAB — COMPREHENSIVE METABOLIC PANEL
ALBUMIN: 3.6 g/dL (ref 3.5–5.0)
ALK PHOS: 66 U/L (ref 40–150)
ALT: 15 U/L (ref 0–55)
AST: 16 U/L (ref 5–34)
Anion Gap: 10 mEq/L (ref 3–11)
BILIRUBIN TOTAL: 0.38 mg/dL (ref 0.20–1.20)
BUN: 43 mg/dL — AB (ref 7.0–26.0)
CALCIUM: 9.9 mg/dL (ref 8.4–10.4)
CO2: 31 mEq/L — ABNORMAL HIGH (ref 22–29)
CREATININE: 1.7 mg/dL — AB (ref 0.6–1.1)
Chloride: 98 mEq/L (ref 98–109)
EGFR: 30 mL/min/{1.73_m2} — ABNORMAL LOW (ref >90)
GLUCOSE: 70 mg/dL (ref 70–140)
Potassium: 4.1 mEq/L (ref 3.5–5.1)
SODIUM: 138 meq/L (ref 136–145)
Total Protein: 7.6 g/dL (ref 6.4–8.3)

## 2017-03-27 LAB — CBC WITH DIFFERENTIAL (CANCER CENTER ONLY)
BASO#: 0 10*3/uL (ref 0.0–0.2)
BASO%: 0.2 % (ref 0.0–2.0)
EOS%: 1.6 % (ref 0.0–7.0)
Eosinophils Absolute: 0.2 10*3/uL (ref 0.0–0.5)
HEMATOCRIT: 35.6 % (ref 34.8–46.6)
HEMOGLOBIN: 11 g/dL — AB (ref 11.6–15.9)
LYMPH#: 1 10*3/uL (ref 0.9–3.3)
LYMPH%: 9.2 % — ABNORMAL LOW (ref 14.0–48.0)
MCH: 29.3 pg (ref 26.0–34.0)
MCHC: 30.9 g/dL — AB (ref 32.0–36.0)
MCV: 95 fL (ref 81–101)
MONO#: 0.9 10*3/uL (ref 0.1–0.9)
MONO%: 9 % (ref 0.0–13.0)
NEUT%: 80 % (ref 39.6–80.0)
NEUTROS ABS: 8.2 10*3/uL — AB (ref 1.5–6.5)
Platelets: 276 10*3/uL (ref 145–400)
RBC: 3.76 10*6/uL (ref 3.70–5.32)
RDW: 16.7 % — ABNORMAL HIGH (ref 11.1–15.7)
WBC: 10.3 10*3/uL — AB (ref 3.9–10.0)

## 2017-03-27 LAB — FERRITIN: FERRITIN: 186 ng/mL (ref 9–269)

## 2017-03-27 LAB — IRON AND TIBC
%SAT: 20 % — ABNORMAL LOW (ref 21–57)
Iron: 53 ug/dL (ref 41–142)
TIBC: 265 ug/dL (ref 236–444)
UIBC: 212 ug/dL (ref 120–384)

## 2017-03-27 NOTE — Progress Notes (Signed)
Hematology and Oncology Follow Up Visit  Paige Hardin [id number] [birth year] 71 y.o. 03/27/2017   Principle Diagnosis:  Anemia secondary to iron deficiency  Anemia secondary to renal insufficiency  Chronic anticoagulation for paroxysmal atrial fibrillation  Current Therapy:   IV iron as indicated - last received in January 2018 Aranesp 300 mcg subcutaneous as needed for hemoglobin less than 11  ELIQUIS - life long   Interim History:  Paige Hardin is here today with a friend for follow-up. She is doing fairly well. She is still on 2-4 L [place] supplemental O2 daily. She feels that her SOB is slightly worse and has a follow-up with pulmonology next week. She will see cardiology again in 2 weeks.  She has had some mild fatigue lately. Hgb is stable at 11.0 with an MCV 95.  No episodes of bleeding on Eliquis. No lymphadenopathy found on exam.  No fever, chills, n.v, cough, rash, dizziness, chest pain, palpitations, abdominal pain or changes in bowel or bladder habits.  The swelling in her feet and ankles is improved. She takes zaroxolyn every other week.  She has maintained a good appetite and is staying hydrated. Her weight is stable.   ECOG Performance Status: 2 - Symptomatic, <50% confined to bed  Medications:  Allergies as of 03/27/2017      Reactions   Ace Inhibitors Cough   Clindamycin Rash      Medication List       Accurate as of 03/27/17 10:43 AM. Always use your most recent med list.          acetaminophen 325 MG tablet Commonly known as:  TYLENOL Take 325 mg by mouth daily as needed for mild pain.   albuterol (2.5 MG/3ML) 0.083% nebulizer solution Commonly known as:  PROVENTIL Take 2.5 mg by nebulization every 6 (six) hours as needed for wheezing or shortness of breath.   albuterol 108 (90 Base) MCG/ACT inhaler Commonly known as:  PROVENTIL HFA;VENTOLIN HFA Inhale 2 puffs into the lungs every 6 (six) hours as needed for wheezing or shortness of breath.   allopurinol  100 MG tablet Commonly known as:  ZYLOPRIM Take 1 tablet (100 mg total) by mouth daily.   amiodarone 200 MG tablet Commonly known as:  PACERONE Take 1 tablet (200 mg total) by mouth daily.   apixaban 5 MG Tabs tablet Commonly known as:  ELIQUIS Take 1 tablet (5 mg total) by mouth 2 (two) times daily.   arformoterol 15 MCG/2ML Nebu Commonly known as:  BROVANA Take 2 mLs (15 mcg total) by nebulization 2 (two) times daily.   benzonatate 200 MG capsule Commonly known as:  TESSALON Take 1 capsule (200 mg total) by mouth 2 (two) times daily as needed for cough.   budesonide-formoterol 160-4.5 MCG/ACT inhaler Commonly known as:  SYMBICORT Inhale 2 puffs into the lungs 2 (two) times daily.   calcium-vitamin D 500-200 MG-UNIT tablet Commonly known as:  OSCAL WITH D Take 1 tablet by mouth daily with breakfast.   DIABETIC TUSSIN EX 100 MG/5ML liquid Generic drug:  guaiFENesin Take 200 mg by mouth 3 (three) times daily as needed for cough.   diltiazem 180 MG 24 hr capsule Commonly known as:  CARDIZEM CD Take 1 capsule (180 mg total) by mouth daily.   ezetimibe-simvastatin 10-40 MG tablet Commonly known as:  VYTORIN Take 1 tablet by mouth at bedtime.   ferrous sulfate 325 (65 FE) MG tablet Take 325 mg by mouth 2 (two) times daily with a meal.  furosemide 80 MG tablet Commonly known as:  LASIX Take 2 tablets (160 mg total) by mouth 2 (two) times daily. MAKE SURE TO TAKE AT 8 AM AND 2 PM   insulin aspart 100 UNIT/ML injection Commonly known as:  novoLOG Inject 20 Units into the skin 3 (three) times daily before meals.   insulin aspart 100 UNIT/ML injection Commonly known as:  novoLOG Inject 0-9 Units into the skin 3 (three) times daily with meals. Correction factor Sliding scale CBG 70 - 120: 0 units CBG 121 - 150: 1 unit,  CBG 151 - 200: 2 units,  CBG 201 - 250: 3 units,  CBG 251 - 300: 5 units,  CBG 301 - 350: 7 units,  CBG 351 - 400: 9 units   CBG > 400: 9 units and notify  your MD   insulin NPH Human 100 UNIT/ML injection Commonly known as:  HUMULIN N,NOVOLIN N Inject 20 Units into the skin 2 (two) times daily before a meal.   metolazone 2.5 MG tablet Commonly known as:  ZAROXOLYN Every other Wednesday   metoprolol 50 MG tablet Commonly known as:  LOPRESSOR Take 1 tablet (50 mg total) by mouth 2 (two) times daily.   MULTIVITAMIN/IRON PO Take 1 tablet by mouth daily.   omeprazole 20 MG capsule Commonly known as:  PRILOSEC Take 20 mg by mouth daily.   OXYGEN Inhale 2 L into the lungs continuous.   potassium chloride SA 20 MEQ tablet Commonly known as:  K-DUR,KLOR-CON Take 20 mEq by mouth 3 (three) times daily. MAKE SURE TO TAKE EXTRA 20 MEQ ON METOLAZONE DAYS   tiotropium 18 MCG inhalation capsule Commonly known as:  SPIRIVA Place 1 capsule (18 mcg total) into inhaler and inhale daily.   vitamin B-12 500 MCG tablet Commonly known as:  CYANOCOBALAMIN Take 500 mcg by mouth daily.   Vitamin C 500 MG Caps Take 500 mg by mouth daily.   Vitamin D 2000 units tablet Take 2,000 Units by mouth daily.       Allergies:  Allergies  Allergen Reactions  . Ace Inhibitors Cough  . Clindamycin Rash    Past Medical History, Surgical history, Social history, and Family History were reviewed and updated.  Review of Systems: All other 10 point review of systems is negative.   Physical Exam:  weight is 298 lb 1.9 oz (135.2 kg). Her oral temperature is 98.2 F (36.8 C). Her blood pressure is 117/37 (abnormal) and her pulse is 63. Her respiration is 20 and oxygen saturation is 90%.   Wt Readings from Last 3 Encounters:  03/27/17 298 lb 1.9 oz (135.2 kg)  01/02/17 (!) 307 lb 3.2 oz (139.3 kg)  11/23/16 (!) 312 lb (141.5 kg)    Ocular: Sclerae unicteric, pupils equal, round and reactive to light Ear-nose-throat: Oropharynx clear, dentition fair Lymphatic: No cervical, supraclavicular or axillary adenopathy Lungs no rales or rhonchi, good  excursion bilaterally Heart regular rate and rhythm, no murmur appreciated Abd soft, nontender, positive bowel sounds, no liver or spleen tip palpated on exam, no fluid wave MSK no focal spinal tenderness, no joint edema Neuro: non-focal, well-oriented, appropriate affect Breasts: Deferred  Lab Results  Component Value Date   WBC 10.3 (H) 03/27/2017   HGB 11.0 (L) 03/27/2017   HCT 35.6 03/27/2017   MCV 95 03/27/2017   PLT 276 03/27/2017   Lab Results  Component Value Date   FERRITIN 215 11/23/2016   IRON 48 11/23/2016   TIBC 291 11/23/2016  UIBC 244 11/23/2016   IRONPCTSAT 16 (L) 11/23/2016   Lab Results  Component Value Date   RETICCTPCT 2.3 09/21/2015   RBC 3.76 03/27/2017   RETICCTABS 89.0 09/21/2015   No results found for: KPAFRELGTCHN, LAMBDASER, KAPLAMBRATIO No results found for: IGGSERUM, IGA, IGMSERUM No results found for: Odetta Pink, SPEI   Chemistry      Component Value Date/Time   NA 137 01/02/2017 1111   NA 141 11/23/2016 1016   K 4.3 01/02/2017 1111   K 4.3 11/23/2016 1016   CL 96 (L) 01/02/2017 1111   CL 96 (L) 12/22/2014 1019   CO2 30 01/02/2017 1111   CO2 30 (H) 11/23/2016 1016   BUN 42 (H) 01/02/2017 1111   BUN 41.7 (H) 11/23/2016 1016   CREATININE 1.67 (H) 01/02/2017 1111   CREATININE 1.5 (H) 11/23/2016 1016      Component Value Date/Time   CALCIUM 9.6 01/02/2017 1111   CALCIUM 10.0 11/23/2016 1016   ALKPHOS 80 11/23/2016 1016   AST 20 11/23/2016 1016   ALT 17 11/23/2016 1016   BILITOT 0.56 11/23/2016 1016      Impression and Plan: Ms. Warsame is a very pleasant 71 yo caucasian female with multifactorial anemia (chronic rena insufficiency and iron deficiency). She is symptomatic at this time with fatigued.  Hgb is 11.0 so she will not need Aranesp this visit.  We will see what her iron studies show and bring her back in later this week for an infusion if needed.  We will plan to see  her back in 4 months for repeat lab work and follow-up  She will contact our office with any questions or concerns. We can certainly see her sooner if need be.   Eliezer Bottom, NP 5/1/201810:43 AM

## 2017-03-28 ENCOUNTER — Telehealth: Payer: Self-pay | Admitting: *Deleted

## 2017-03-28 NOTE — Telephone Encounter (Addendum)
Patient is aware of results. Patient will call later to schedule  ----- Message from Eliezer Bottom, NP sent at 03/27/2017  3:08 PM EDT ----- Regarding: iron  Iron still slightly low. She will need one dose of IV iron. LOS sent to [facility]. Thank you!  Sarah  ----- Message ----- From: Interface, Lab In Three Zero One Sent: 03/27/2017  10:34 AM To: Eliezer Bottom, NP

## 2017-03-29 ENCOUNTER — Ambulatory Visit: Payer: Federal, State, Local not specified - PPO | Admitting: Emergency Medicine

## 2017-03-30 ENCOUNTER — Ambulatory Visit (HOSPITAL_BASED_OUTPATIENT_CLINIC_OR_DEPARTMENT_OTHER): Payer: Medicare Other

## 2017-03-30 VITALS — BP 139/50 | HR 57 | Temp 97.5°F | Resp 20

## 2017-03-30 DIAGNOSIS — D508 Other iron deficiency anemias: Secondary | ICD-10-CM | POA: Diagnosis present

## 2017-03-30 MED ORDER — SODIUM CHLORIDE 0.9 % IV SOLN
510.0000 mg | Freq: Once | INTRAVENOUS | Status: AC
  Administered 2017-03-30: 510 mg via INTRAVENOUS
  Filled 2017-03-30: qty 17

## 2017-04-03 ENCOUNTER — Encounter: Payer: Self-pay | Admitting: Emergency Medicine

## 2017-04-03 ENCOUNTER — Ambulatory Visit (INDEPENDENT_AMBULATORY_CARE_PROVIDER_SITE_OTHER): Payer: Medicare Other | Admitting: Emergency Medicine

## 2017-04-03 DIAGNOSIS — R911 Solitary pulmonary nodule: Secondary | ICD-10-CM | POA: Diagnosis not present

## 2017-04-03 DIAGNOSIS — J438 Other emphysema: Secondary | ICD-10-CM | POA: Diagnosis not present

## 2017-04-03 NOTE — Addendum Note (Signed)
Addended by: Jannette Spanner on: 04/03/2017 12:04 PM   Modules accepted: Orders

## 2017-04-03 NOTE — Addendum Note (Signed)
Addended by: Jannette Spanner on: 04/03/2017 11:55 AM   Modules accepted: Orders

## 2017-04-03 NOTE — Patient Instructions (Signed)
Continue Spiriva once a day Continue Symbicort 2 puffs twice a day Stop brovana and pulmicort nebulizers Continue oxygen: 3L/min via home concentrator; portable tank > 2L/min continuous at rest and increase to 4L/min with exertion.  We will repeat your CT scan of the chest in 1 year to follow your pulmonary nodule.  Follow with Dr Lamonte Sakai in 6 months or sooner if you have any problems

## 2017-04-03 NOTE — Assessment & Plan Note (Signed)
Stable over the most recent 6 month interval. If it is growing that it is happening slowly. Question whether this may be a hamartoma. I believe he needs continued surveillance. We will repeat her CT scan in 12 months or sooner if she changes clinically.

## 2017-04-03 NOTE — Progress Notes (Signed)
Subjective:    Patient ID: Paige Hardin, female    DOB: May 03, 1946, 71 y.o.   MRN: [id number]  HPI OFFice Note [facility] 10/2014 The patient comes in today for follow-up of her known COPD with chronic respiratory failure. She also has known chronic congestive heart failure, and is monitoring her fluid balance very carefully. She has been compliant on her bronchodilator regimen and oxygen, and feels that she is near her usual baseline. She has not had an acute exacerbation or pulmonary infection according to her history since the last visit. She has been trying to exercise more regularly, and is even walking around her building.  [name] 05/18/15 -- Follow up visit for pt followed by Dr Gwenette Greet for COPD, chronic resp failure. She has had some congestion and hoarseness this spring. She is having dry cough, is not currently on allergy. She is stable on spiriva and symbicort, has some associated hoarseness. She goes to CHF clinic, has been adjusting diuretics. She believes that her breathing has been good, denies wheeze, has been on 2L/min. Uses SABA very rarely.   [name] 12/23/15 -- follow-up visit for COPD and associated chronic hypoxemic respiratory failure. She also has allergic rhinitis with associated hoarseness and occasional cough.  She tells me that she was admitted to [facility] in 12/16 for an AE COPD, sinusitis, UTI.  She underwent chest CT 10/07/15 that I have reviewed personally, shows a small increase in size to 68mm. Needs a repeat CT in 6 months. No family hx lung CA. She reports that her breathing has been difficult with exertion. She has hypoxemia with exertion that has been documented at [employer]. She is usually on 2L/min pulsed at all times. ? Whether she is triggering the pulse?  She is on symbicort and spiriva. Still coughing some. Rare albuterol.   [name] 03/02/16 -- patient with a history of COPD and chronic hypoxemic respite for failure, allergic rhinitis and associated cough. His right lower lobe  pulmonary nodule that was identified in 2015 and which we have followed with serial CT scans of the chest. Her most recent scan was on 02/29/16 that I personally reviewed.  She tells me that she was hosp for AE-COPD and possible PNA in 12/16. She notes more exertional SOB, sees desaturations if she uses pulsed O2 when she walks > better if on continuous.   [name] 04/03/17 - this is a follow-up visit for patient with a history of COPD and associated chronic hypoxemic respiratory failure. She also has allergic rhinitis with associated cough. We have also followed at a rounded solid nodule in the right lower lobe that had been slowly enlarging on CT scan of the chest. Her most recent CT was 09/12/16. I have reviewed. This shows that the 1 cm right lower lobe nodule had not significantly changed since 02/29/16 but had enlarged compared with more remote priors.  She tells me that she was hospitalized 78/29/56 for UTI, diastolic CHF. She improved but not quite back to her prior baseline. She is having exertional SOB, significant fatigue. She has been turning her o2 up to 4L/min with exertion. Needs to have her concentrator at [facility] turned up to 3L/min. She is using Spiriva, Symbicort, brovana / pulmocort. She has albuterol available, rarely uses. No flares since November 2017.    Review of Systems  Constitutional: Negative for fever and unexpected weight change.  HENT: Positive for congestion and postnasal drip. Negative for dental problem, ear pain, nosebleeds, rhinorrhea, sinus pressure, sneezing, sore throat  and trouble swallowing.   Eyes: Negative for redness and itching.  Respiratory: Positive for cough and shortness of breath. Negative for chest tightness and wheezing.   Cardiovascular: Negative for palpitations and leg swelling.  Gastrointestinal: Negative for nausea and vomiting.  Genitourinary: Negative for dysuria.  Musculoskeletal: Negative for joint swelling.  Skin: Negative for rash.    Neurological: Negative for headaches.  Hematological: Does not bruise/bleed easily.  Psychiatric/Behavioral: Negative for dysphoric mood. The patient is not nervous/anxious.        Objective:   Physical Exam  Vitals:   04/03/17 1132  BP: 130/88  Pulse: 65  SpO2: 92%  Weight: 296 lb 12.8 oz (134.6 kg)  Height: 5\' 5"  (1.651 m)   Gen: Pleasant, Chronically ill-appearing, well-nourished, in no distress,  normal affect  ENT: No lesions,  mouth clear,  oropharynx clear, no postnasal drip  Neck: No JVD, no stridor  Lungs: No use of accessory muscles,clear without rales or rhonchi  Cardiovascular: RRR, heart sounds normal, no murmur or gallops, no peripheral edema  Musculoskeletal: No deformities, no cyanosis or clubbing  Neuro: alert, non focal  Skin: Warm, no lesions or rashes      Assessment & Plan:  COPD with emphysema  Continue Spiriva once a day Continue Symbicort 2 puffs twice a day Stop brovana and pulmicort nebulizers Continue oxygen: 3L/min via home concentrator; portable tank > 2L/min continuous at rest and increase to 4L/min with exertion.   Solitary pulmonary nodule Stable over the most recent 6 month interval. If it is growing that it is happening slowly. Question whether this may be a hamartoma. I believe he needs continued surveillance. We will repeat her CT scan in 12 months or sooner if she changes clinically.  Baltazar Apo, MD, PhD 04/03/2017, 11:50 AM [facility] [phone] or if no answer [phone]

## 2017-04-03 NOTE — Assessment & Plan Note (Signed)
Continue Spiriva once a day Continue Symbicort 2 puffs twice a day Stop brovana and pulmicort nebulizers Continue oxygen: 3L/min via home concentrator; portable tank > 2L/min continuous at rest and increase to 4L/min with exertion.

## 2017-04-10 ENCOUNTER — Ambulatory Visit (HOSPITAL_COMMUNITY)
Admission: RE | Admit: 2017-04-10 | Discharge: 2017-04-10 | Disposition: A | Discharge status: Home / Self Care | Payer: Medicare Other | Source: Ambulatory Visit | Attending: Family Medicine | Admitting: Family Medicine

## 2017-04-10 ENCOUNTER — Encounter (HOSPITAL_COMMUNITY): Payer: Self-pay

## 2017-04-10 ENCOUNTER — Ambulatory Visit (HOSPITAL_BASED_OUTPATIENT_CLINIC_OR_DEPARTMENT_OTHER)
Admission: RE | Admit: 2017-04-10 | Discharge: 2017-04-10 | Disposition: A | Discharge status: Home / Self Care | Payer: Medicare Other | Source: Ambulatory Visit | Attending: Cardiology | Admitting: Cardiology

## 2017-04-10 VITALS — BP 132/58 | HR 58 | Wt 296.8 lb

## 2017-04-10 DIAGNOSIS — I5032 Chronic diastolic (congestive) heart failure: Secondary | ICD-10-CM

## 2017-04-10 DIAGNOSIS — J449 Chronic obstructive pulmonary disease, unspecified: Secondary | ICD-10-CM | POA: Insufficient documentation

## 2017-04-10 DIAGNOSIS — Z794 Long term (current) use of insulin: Secondary | ICD-10-CM | POA: Insufficient documentation

## 2017-04-10 DIAGNOSIS — I480 Paroxysmal atrial fibrillation: Secondary | ICD-10-CM | POA: Insufficient documentation

## 2017-04-10 DIAGNOSIS — Z87891 Personal history of nicotine dependence: Secondary | ICD-10-CM | POA: Diagnosis not present

## 2017-04-10 DIAGNOSIS — I340 Nonrheumatic mitral (valve) insufficiency: Secondary | ICD-10-CM | POA: Diagnosis not present

## 2017-04-10 DIAGNOSIS — I272 Pulmonary hypertension, unspecified: Secondary | ICD-10-CM | POA: Diagnosis not present

## 2017-04-10 DIAGNOSIS — E1122 Type 2 diabetes mellitus with diabetic chronic kidney disease: Secondary | ICD-10-CM | POA: Insufficient documentation

## 2017-04-10 DIAGNOSIS — I350 Nonrheumatic aortic (valve) stenosis: Secondary | ICD-10-CM | POA: Insufficient documentation

## 2017-04-10 DIAGNOSIS — Z79899 Other long term (current) drug therapy: Secondary | ICD-10-CM | POA: Insufficient documentation

## 2017-04-10 DIAGNOSIS — N183 Chronic kidney disease, stage 3 (moderate): Secondary | ICD-10-CM | POA: Insufficient documentation

## 2017-04-10 DIAGNOSIS — I130 Hypertensive heart and chronic kidney disease with heart failure and stage 1 through stage 4 chronic kidney disease, or unspecified chronic kidney disease: Secondary | ICD-10-CM | POA: Diagnosis not present

## 2017-04-10 LAB — BRAIN NATRIURETIC PEPTIDE: B NATRIURETIC PEPTIDE 5: 214.3 pg/mL — AB (ref 0.0–100.0)

## 2017-04-10 LAB — TSH: TSH: 3.515 u[IU]/mL (ref 0.350–4.500)

## 2017-04-10 NOTE — Patient Instructions (Addendum)
Following Instructions sent back to [facility]:  Labs today  Your physician recommends that you schedule a follow-up appointment in: 1 month

## 2017-04-10 NOTE — Progress Notes (Signed)
  Echocardiogram 2D Echocardiogram has been performed.  Paige Hardin 04/10/2017, 10:52 AM

## 2017-04-12 NOTE — Progress Notes (Signed)
Patient ID: Paige Hardin, female   DOB: [birth year], 71 y.o.   MRN: [id number]     Advanced Heart Failure Clinic Note   Pulmonologist: Dr. Lamonte Sakai  HF Cardiologist: Aundra Dubin   71 yo with history of morbid obesity, COPD on home oxygen, paroxysmal atrial fibrillation, and chronic diastolic CHF presents for followup. She was admitted for several weeks from 3/15 to 4/15 with atrial fibrillation/RVR, acute on chronic diastolic CHF, and COPD exacerbation.  She ended up being cardioverted in the hospital.  She was diuresed with IV Lasix.  After the prolonged hospitalization, she was sent to [facility].  She was again admitted at the end of 4/15 and hospitalized into 5/15 with acute on chronic diastolic CHF. She was intubated this time and went back into atrial fibrillation with RVR.  She was cardioverted back to NSR and is maintained on amiodarone.  She was again diuresed and discharged to SNF [facility]).     Admitted 10/17/16 through 10/20/16 and treated for UTI and mild volume overload. Treated with IV antibiotics and transitioned to keflex. Diuresed with IV lasix and transitioned to lasix 160 mg twice a day. ECHO 09/2016 with EF 45-50% and Grade II DD. Discharge weight was 303 pounds.   She returns today for regular follow up. Currently resides in SNF at [facility]. Weight at facility stable 300-303. Has a little more SOB for the past few months.  Her 02 level has been dropping down into 70s with exertion with 2 L 02 continuously. Hasn't followed up with Dr. Lamonte Sakai since last fall.  Taking metolazone every 2 weeks or so (as needed). Does occasionally have PND.  Sleeps in a recliner chronically. Has really been watching her salt and fluid in take.  Labs (4/15): K 4.4, creatinine 1.4 Labs (5/15): creatinine 1.3, hemoglobin 8.5 Labs (04/30/14 ): K 3.6, creatinine 1.6 Labs (05/05/14) : K 4.4, creatinine 1.42, TSH 3.69, pro-BNP 999, AST 15, ALT 14 Labs (06/18/14): K 4.1, creatinine 1.55, AST 19, ALT 19 Labs  (4/16): K 4.3, creatinine 1.33, HCT 36 Labs (5/16): K 4.2, creatinine 2.09, TSH normal, LFTs normal Labs (8/16): K 4.2, creatinine 1.35, TSH normal Labs (10/20/2016): K 4.6 Creatinine 1.65.  Labs (5/18): K 4.1, creatinine 1.7, LFTs normal, hgb 11  1. Chronic low back pain.  2. COPD: On home oxygen. Prior smoker.  3. Type 2 diabetes.  4. Paroxysmal atrial fibrillation:  DCCV in 2010 and again in 4/15.  She is on warfarin and is on amiodarone to maintain NSR.  5. Hyperlipidemia.  6. Diastolic congestive heart failure: Echo (2/15) with EF 60%, mild MR, PA systolic pressure 53 mmHg. Echo (5/16) with mild LVH, EF 55-60%, mild MR, moderate biatrial enlargement.   - ECHO 09/2016: EF 45-50%. Mild/mod aortic stenosis Grade II DD.  - Echo 5/18: EF 50-55%, normal RV size and systolic function, PASP 51 mmHg, moderate aortic stenosis with mean gradient 27 mmHg, moderate MR.  7. Hypertension.  8. Low back pain 9. Obesity 10. CKD 11. Anemia: Fe deficiency 12. RLL nodule  13. ACEI cough 14. Suspected gout 15. Aortic stenosis: Moderate on 5/18 echo.   SOCIAL HISTORY: Prior smoker.  Patient is currently in SNF [facility]).  FAMILY HISTORY: Non-Hodgkin lymphoma and breast cancer. No early-onset  coronary artery disease.   REVIEW OF SYSTEMS: Negative except as noted in the history of present  Illness.  Current Outpatient Prescriptions  Medication Sig Dispense Refill  . acetaminophen (TYLENOL) 325 MG tablet Take 325 mg by mouth daily  as needed for mild pain.     Marland Kitchen albuterol (PROVENTIL HFA;VENTOLIN HFA) 108 (90 Base) MCG/ACT inhaler Inhale 2 puffs into the lungs every 6 (six) hours as needed for wheezing or shortness of breath.    Marland Kitchen albuterol (PROVENTIL) (2.5 MG/3ML) 0.083% nebulizer solution Take 2.5 mg by nebulization every 6 (six) hours as needed for wheezing or shortness of breath.    . allopurinol (ZYLOPRIM) 100 MG tablet Take 1 tablet (100 mg total) by mouth daily.    Marland Kitchen amiodarone (PACERONE)  200 MG tablet Take 1 tablet (200 mg total) by mouth daily.    Marland Kitchen apixaban (ELIQUIS) 5 MG TABS tablet Take 1 tablet (5 mg total) by mouth 2 (two) times daily. 60 tablet   . arformoterol (BROVANA) 15 MCG/2ML NEBU Take 2 mLs (15 mcg total) by nebulization 2 (two) times daily. 120 mL 1  . Ascorbic Acid (VITAMIN C) 500 MG CAPS Take 500 mg by mouth daily.     . benzonatate (TESSALON) 200 MG capsule Take 1 capsule (200 mg total) by mouth 2 (two) times daily as needed for cough. 30 capsule 0  . budesonide-formoterol (SYMBICORT) 160-4.5 MCG/ACT inhaler Inhale 2 puffs into the lungs 2 (two) times daily. 1 Inhaler 5  . calcium-vitamin D (OSCAL WITH D) 500-200 MG-UNIT per tablet Take 1 tablet by mouth daily with breakfast.    . Cholecalciferol (VITAMIN D) 2000 UNITS tablet Take 2,000 Units by mouth daily.    Marland Kitchen diltiazem (CARDIZEM CD) 180 MG 24 hr capsule Take 1 capsule (180 mg total) by mouth daily. 30 capsule 3  . ezetimibe-simvastatin (VYTORIN) 10-40 MG per tablet Take 1 tablet by mouth at bedtime.     . ferrous sulfate 325 (65 FE) MG tablet Take 325 mg by mouth 2 (two) times daily with a meal.     . furosemide (LASIX) 80 MG tablet Take 2 tablets (160 mg total) by mouth 2 (two) times daily. MAKE SURE TO TAKE AT 8 AM AND 2 PM    . guaiFENesin (DIABETIC TUSSIN EX) 100 MG/5ML liquid Take 200 mg by mouth 3 (three) times daily as needed for cough.    . insulin aspart (NOVOLOG) 100 UNIT/ML injection Inject 0-9 Units into the skin 3 (three) times daily with meals. Correction factor Sliding scale CBG 70 - 120: 0 units CBG 121 - 150: 1 unit,  CBG 151 - 200: 2 units,  CBG 201 - 250: 3 units,  CBG 251 - 300: 5 units,  CBG 301 - 350: 7 units,  CBG 351 - 400: 9 units   CBG > 400: 9 units and notify your MD 10 mL 11  . insulin NPH Human (HUMULIN N,NOVOLIN N) 100 UNIT/ML injection Inject 20 Units into the skin 2 (two) times daily before a meal.     . metolazone (ZAROXOLYN) 2.5 MG tablet Every other Wednesday 15 tablet 3  .  metoprolol (LOPRESSOR) 50 MG tablet Take 1 tablet (50 mg total) by mouth 2 (two) times daily. 60 tablet 3  . Multiple Vitamins-Iron (MULTIVITAMIN/IRON PO) Take 1 tablet by mouth daily.    Marland Kitchen omeprazole (PRILOSEC) 20 MG capsule Take 20 mg by mouth daily.    . OXYGEN Inhale 2 L into the lungs continuous.    . potassium chloride SA (K-DUR,KLOR-CON) 20 MEQ tablet Take 20 mEq by mouth 3 (three) times daily. MAKE SURE TO TAKE EXTRA 20 MEQ ON METOLAZONE DAYS    . tiotropium (SPIRIVA) 18 MCG inhalation capsule Place 1 capsule (18  mcg total) into inhaler and inhale daily. 30 capsule 5  . vitamin B-12 (CYANOCOBALAMIN) 500 MCG tablet Take 500 mcg by mouth daily.     No current facility-administered medications for this encounter.     Vitals:   04/10/17 1117  BP: (!) 132/58  Pulse: (!) 58  SpO2: 95%  Weight: 296 lb 12 oz (134.6 kg)   Wt Readings from Last 3 Encounters:  04/10/17 296 lb 12 oz (134.6 kg)  04/03/17 296 lb 12.8 oz (134.6 kg)  03/27/17 298 lb 1.9 oz (135.2 kg)    General:  NAD. On 2L 02 via [place]. Niece present.   Neck: Thick, JVP not elevated, no thyromegaly or thyroid nodule.  Lungs: Decreased throughout.  CV: Nondisplaced PMI.  Heart regular S1/S2, no S3/S4, 2/6 early SEM RUSB with clearly heard S2.  No carotid bruit.   Abdomen: Soft, NT, ND, no HSM. No bruits or masses. +BS  Skin: Intact without lesions or rashes.  Neurologic: Alert and oriented x 3.  Psych: Normal affect. Extremities: No clubbing or cyanosis. Trace ankle edema.   Assessment/Plan:  1. Atrial fibrillation: Paroxysmal.  NSR by exam today. - Continue amiodarone 200 mg daily. Had recent LFTs, check TSH today.  Will need regular eye exam.  - Continue Eliquis 5 mg bid.  2. Chronic diastolic CHF: Echo was done today and reviewed, EF 50-55% with moderate AS and moderate MR.  - Continue lasix 160 mg BID + metolazone 2.5 mg every other week.    - We discussed Cardiomems implantation.  I think this would be a good idea.   She wants to think about it.   3. CKD stage III: Stable creatinine when recently checked.   4. COPD: Continues home oxygen.   5. Aortic Stenosis: Moderate on echo done today.    6. Morbid Obesity: Continue weight loss efforts.    Followup in 1 month with NP to discuss Cardiomems.    Loralie Champagne, MD  04/12/2017

## 2017-04-17 ENCOUNTER — Encounter: Payer: Self-pay | Admitting: Emergency Medicine

## 2017-04-17 DIAGNOSIS — J438 Other emphysema: Secondary | ICD-10-CM

## 2017-04-17 NOTE — Telephone Encounter (Signed)
Spoke with RB-  He recommends humidification for pt's O2 and for her to try nasal saline or gel for the dryness. If she begins to have more s/s that indicate a sinus infection such as bloody sinus mucus, discolored mucus, headache and fever then to call our office.   These recs have been relayed to pt. Nothing further needed at this time. Will sign off.

## 2017-04-17 NOTE — Telephone Encounter (Signed)
RB  Please Advise- please see pt email.

## 2017-04-20 ENCOUNTER — Telehealth (HOSPITAL_COMMUNITY): Payer: Self-pay | Admitting: *Deleted

## 2017-04-20 NOTE — Telephone Encounter (Signed)
Received Fax from [facility].  Patient's daily weights as follows:  5/1- 298.2 lb 5/2- 294.4 lb 5/3- 294.8 lb 5/4- 293.4 lb 5/5- 293.4 lb 5/6- 293.3 lb 5/7- 294.8 lb 5/8- 294.8 lb 5/9- 294.6 lb 5/10- 292 lb 5/11- 294.2 lb 5/12- 294 lb 5/13- 293.4 lb 5/14- 296.6 lb 5/15- 294.6 lb 5/16- 296.6 lb 5/17- 295 lb 5/18- 293.8 lb 5/19- 294 lb 5/20- 294 lb 5/21- 293.8 lb 5/22- 294 lb 5/23- 293.1 lb 5/24- 295 lb 5/25- 292.6 lb  Weights are stable

## 2017-05-01 ENCOUNTER — Telehealth: Payer: Self-pay | Admitting: Emergency Medicine

## 2017-05-01 DIAGNOSIS — R609 Edema, unspecified: Secondary | ICD-10-CM | POA: Diagnosis not present

## 2017-05-01 DIAGNOSIS — Z794 Long term (current) use of insulin: Secondary | ICD-10-CM | POA: Diagnosis not present

## 2017-05-01 DIAGNOSIS — E1165 Type 2 diabetes mellitus with hyperglycemia: Secondary | ICD-10-CM | POA: Diagnosis not present

## 2017-05-01 DIAGNOSIS — Z6841 Body Mass Index (BMI) 40.0 and over, adult: Secondary | ICD-10-CM | POA: Diagnosis not present

## 2017-05-01 DIAGNOSIS — H9190 Unspecified hearing loss, unspecified ear: Secondary | ICD-10-CM | POA: Diagnosis not present

## 2017-05-15 ENCOUNTER — Encounter (HOSPITAL_COMMUNITY): Payer: Medicare Other

## 2017-06-04 DIAGNOSIS — L821 Other seborrheic keratosis: Secondary | ICD-10-CM | POA: Diagnosis not present

## 2017-06-04 DIAGNOSIS — L603 Nail dystrophy: Secondary | ICD-10-CM | POA: Diagnosis not present

## 2017-06-04 DIAGNOSIS — L84 Corns and callosities: Secondary | ICD-10-CM | POA: Diagnosis not present

## 2017-06-04 DIAGNOSIS — E119 Type 2 diabetes mellitus without complications: Secondary | ICD-10-CM | POA: Diagnosis not present

## 2017-07-17 ENCOUNTER — Ambulatory Visit (HOSPITAL_COMMUNITY)
Admission: RE | Admit: 2017-07-17 | Discharge: 2017-07-17 | Disposition: A | Discharge status: Home / Self Care | Payer: Medicare Other | Source: Ambulatory Visit | Attending: Cardiology | Admitting: Cardiology

## 2017-07-17 ENCOUNTER — Telehealth (HOSPITAL_COMMUNITY): Payer: Self-pay

## 2017-07-17 ENCOUNTER — Encounter (HOSPITAL_COMMUNITY): Payer: Self-pay | Admitting: Cardiology

## 2017-07-17 VITALS — BP 124/56 | HR 62 | Wt 300.5 lb

## 2017-07-17 DIAGNOSIS — Z794 Long term (current) use of insulin: Secondary | ICD-10-CM | POA: Insufficient documentation

## 2017-07-17 DIAGNOSIS — D509 Iron deficiency anemia, unspecified: Secondary | ICD-10-CM | POA: Insufficient documentation

## 2017-07-17 DIAGNOSIS — E1122 Type 2 diabetes mellitus with diabetic chronic kidney disease: Secondary | ICD-10-CM | POA: Insufficient documentation

## 2017-07-17 DIAGNOSIS — M545 Low back pain: Secondary | ICD-10-CM | POA: Diagnosis not present

## 2017-07-17 DIAGNOSIS — J449 Chronic obstructive pulmonary disease, unspecified: Secondary | ICD-10-CM | POA: Insufficient documentation

## 2017-07-17 DIAGNOSIS — I5032 Chronic diastolic (congestive) heart failure: Secondary | ICD-10-CM

## 2017-07-17 DIAGNOSIS — N183 Chronic kidney disease, stage 3 (moderate): Secondary | ICD-10-CM | POA: Insufficient documentation

## 2017-07-17 DIAGNOSIS — R001 Bradycardia, unspecified: Secondary | ICD-10-CM | POA: Insufficient documentation

## 2017-07-17 DIAGNOSIS — I130 Hypertensive heart and chronic kidney disease with heart failure and stage 1 through stage 4 chronic kidney disease, or unspecified chronic kidney disease: Secondary | ICD-10-CM | POA: Diagnosis not present

## 2017-07-17 DIAGNOSIS — I480 Paroxysmal atrial fibrillation: Secondary | ICD-10-CM

## 2017-07-17 DIAGNOSIS — I5020 Unspecified systolic (congestive) heart failure: Secondary | ICD-10-CM | POA: Diagnosis not present

## 2017-07-17 DIAGNOSIS — Z8249 Family history of ischemic heart disease and other diseases of the circulatory system: Secondary | ICD-10-CM | POA: Insufficient documentation

## 2017-07-17 DIAGNOSIS — Z7901 Long term (current) use of anticoagulants: Secondary | ICD-10-CM | POA: Diagnosis not present

## 2017-07-17 DIAGNOSIS — I350 Nonrheumatic aortic (valve) stenosis: Secondary | ICD-10-CM | POA: Diagnosis not present

## 2017-07-17 DIAGNOSIS — Z9981 Dependence on supplemental oxygen: Secondary | ICD-10-CM | POA: Diagnosis not present

## 2017-07-17 DIAGNOSIS — G8929 Other chronic pain: Secondary | ICD-10-CM | POA: Diagnosis not present

## 2017-07-17 LAB — COMPREHENSIVE METABOLIC PANEL
ALBUMIN: 3.7 g/dL (ref 3.5–5.0)
ALT: 17 U/L (ref 14–54)
ANION GAP: 10 (ref 5–15)
AST: 20 U/L (ref 15–41)
Alkaline Phosphatase: 66 U/L (ref 38–126)
BUN: 46 mg/dL — AB (ref 6–20)
CHLORIDE: 97 mmol/L — AB (ref 101–111)
CO2: 31 mmol/L (ref 22–32)
Calcium: 9.6 mg/dL (ref 8.9–10.3)
Creatinine, Ser: 1.74 mg/dL — ABNORMAL HIGH (ref 0.44–1.00)
GFR calc Af Amer: 33 mL/min — ABNORMAL LOW (ref >60)
GFR calc non Af Amer: 28 mL/min — ABNORMAL LOW (ref >60)
GLUCOSE: 78 mg/dL (ref 65–99)
POTASSIUM: 3.9 mmol/L (ref 3.5–5.1)
Sodium: 138 mmol/L (ref 135–145)
TOTAL PROTEIN: 7.1 g/dL (ref 6.5–8.1)
Total Bilirubin: 0.6 mg/dL (ref 0.3–1.2)

## 2017-07-17 NOTE — Telephone Encounter (Signed)
Notified via phone by [facility] main lab that patient's lab specimens were insufficient and unable to run the labs tests. RN in CHF clinic Kevan Rosebush made aware.  Renee Pain, RN

## 2017-07-17 NOTE — Progress Notes (Signed)
Lab was unable to get enough blood to process BMET on Pt.Paige Hardin will draw lab on Tuesday 8/28. Faxed over order to [phone].

## 2017-07-17 NOTE — Patient Instructions (Signed)
Labs done today  Your physician recommends that you schedule a follow-up appointment in: 3 months  

## 2017-07-18 NOTE — Progress Notes (Signed)
Patient ID: Paige Hardin, female   DOB: [birth year], 71 y.o.   MRN: [id number]     Advanced Heart Failure Clinic Note   Pulmonologist: Dr. Lamonte Sakai  PCP: Tollie Pizza HF Cardiologist: Aundra Dubin   71 yo with history of morbid obesity, COPD on home oxygen, paroxysmal atrial fibrillation, and chronic diastolic CHF presents for followup. She was admitted for several weeks from 3/15 to 4/15 with atrial fibrillation/RVR, acute on chronic diastolic CHF, and COPD exacerbation.  She ended up being cardioverted in the hospital.  She was diuresed with IV Lasix.  After the prolonged hospitalization, she was sent to [facility].  She was again admitted at the end of 4/15 and hospitalized into 5/15 with acute on chronic diastolic CHF. She was intubated this time and went back into atrial fibrillation with RVR.  She was cardioverted back to NSR and is maintained on amiodarone.  She was again diuresed and discharged to SNF [facility]).     Admitted 10/17/16 through 10/20/16 and treated for UTI and mild volume overload. Treated with IV antibiotics and transitioned to keflex. Diuresed with IV lasix and transitioned to lasix 160 mg twice a day. ECHO 09/2016 with EF 45-50% and Grade II DD. Discharge weight was 303 pounds.   She returns today for regular follow up. Currently resides in SNF at [facility]. She is wearing home oxygen, using 4L when walking and 2L at rest.  She says that her dyspnea is mildly worse, she is short of breath walking in the hall at SNF, walks with walker.  She sleeps in a recliner, has been doing so long-term.  She is able to lie back in it, when she is more volume overloaded, she cannot lie back.    Labs (4/15): K 4.4, creatinine 1.4 Labs (5/15): creatinine 1.3, hemoglobin 8.5 Labs (04/30/14 ): K 3.6, creatinine 1.6 Labs (05/05/14) : K 4.4, creatinine 1.42, TSH 3.69, pro-BNP 999, AST 15, ALT 14 Labs (06/18/14): K 4.1, creatinine 1.55, AST 19, ALT 19 Labs (4/16): K 4.3, creatinine 1.33, HCT 36 Labs  (5/16): K 4.2, creatinine 2.09, TSH normal, LFTs normal Labs (8/16): K 4.2, creatinine 1.35, TSH normal Labs (10/20/2016): K 4.6 Creatinine 1.65.  Labs (5/18): K 4.1, creatinine 1.7, LFTs normal, hgb 11, TSH normal, BNP 214  ECG (personally reviewed): NSR, low voltage Ps, rSR' V1  1. Chronic low back pain.  2. COPD: On home oxygen. Prior smoker.  3. Type 2 diabetes.  4. Paroxysmal atrial fibrillation:  DCCV in 2010 and again in 4/15.  She is on warfarin and is on amiodarone to maintain NSR.  5. Hyperlipidemia.  6. Diastolic congestive heart failure: Echo (2/15) with EF 60%, mild MR, PA systolic pressure 53 mmHg. Echo (5/16) with mild LVH, EF 55-60%, mild MR, moderate biatrial enlargement.   - ECHO 09/2016: EF 45-50%. Mild/mod aortic stenosis Grade II DD.  - Echo 5/18: EF 50-55%, normal RV size and systolic function, PASP 51 mmHg, moderate aortic stenosis with mean gradient 27 mmHg, moderate MR.  7. Hypertension.  8. Low back pain 9. Obesity 10. CKD 11. Anemia: Fe deficiency 12. RLL nodule  13. ACEI cough 14. Suspected gout 15. Aortic stenosis: Moderate on 5/18 echo.  16. Negative sleep study  SOCIAL HISTORY: Prior smoker.  Patient is currently in SNF [facility]).  FAMILY HISTORY: Non-Hodgkin lymphoma and breast cancer. No early-onset  coronary artery disease.   REVIEW OF SYSTEMS: Negative except as noted in the history of present  Illness.  Current Outpatient Prescriptions  Medication Sig Dispense Refill  . acetaminophen (TYLENOL) 325 MG tablet Take 325 mg by mouth daily as needed for mild pain.     Marland Kitchen albuterol (PROVENTIL HFA;VENTOLIN HFA) 108 (90 Base) MCG/ACT inhaler Inhale 2 puffs into the lungs every 6 (six) hours as needed for wheezing or shortness of breath.    Marland Kitchen albuterol (PROVENTIL) (2.5 MG/3ML) 0.083% nebulizer solution Take 2.5 mg by nebulization every 6 (six) hours as needed for wheezing or shortness of breath.    . allopurinol (ZYLOPRIM) 100 MG tablet Take 1 tablet  (100 mg total) by mouth daily.    Marland Kitchen amiodarone (PACERONE) 200 MG tablet Take 1 tablet (200 mg total) by mouth daily.    Marland Kitchen apixaban (ELIQUIS) 5 MG TABS tablet Take 1 tablet (5 mg total) by mouth 2 (two) times daily. 60 tablet   . arformoterol (BROVANA) 15 MCG/2ML NEBU Take 2 mLs (15 mcg total) by nebulization 2 (two) times daily. 120 mL 1  . Ascorbic Acid (VITAMIN C) 500 MG CAPS Take 500 mg by mouth daily.     . benzonatate (TESSALON) 200 MG capsule Take 1 capsule (200 mg total) by mouth 2 (two) times daily as needed for cough. 30 capsule 0  . budesonide-formoterol (SYMBICORT) 160-4.5 MCG/ACT inhaler Inhale 2 puffs into the lungs 2 (two) times daily. 1 Inhaler 5  . calcium-vitamin D (OSCAL WITH D) 500-200 MG-UNIT per tablet Take 1 tablet by mouth daily with breakfast.    . Cholecalciferol (VITAMIN D) 2000 UNITS tablet Take 2,000 Units by mouth daily.    Marland Kitchen diltiazem (CARDIZEM CD) 180 MG 24 hr capsule Take 1 capsule (180 mg total) by mouth daily. 30 capsule 3  . ezetimibe-simvastatin (VYTORIN) 10-40 MG per tablet Take 1 tablet by mouth at bedtime.     . ferrous sulfate 325 (65 FE) MG tablet Take 325 mg by mouth 2 (two) times daily with a meal.     . furosemide (LASIX) 80 MG tablet Take 2 tablets (160 mg total) by mouth 2 (two) times daily. MAKE SURE TO TAKE AT 8 AM AND 2 PM    . guaiFENesin (DIABETIC TUSSIN EX) 100 MG/5ML liquid Take 200 mg by mouth 3 (three) times daily as needed for cough.    . insulin aspart (NOVOLOG) 100 UNIT/ML injection Inject 0-9 Units into the skin 3 (three) times daily with meals. Correction factor Sliding scale CBG 70 - 120: 0 units CBG 121 - 150: 1 unit,  CBG 151 - 200: 2 units,  CBG 201 - 250: 3 units,  CBG 251 - 300: 5 units,  CBG 301 - 350: 7 units,  CBG 351 - 400: 9 units   CBG > 400: 9 units and notify your MD 10 mL 11  . insulin NPH Human (HUMULIN N,NOVOLIN N) 100 UNIT/ML injection Inject 20 Units into the skin 2 (two) times daily before a meal.     . metolazone  (ZAROXOLYN) 2.5 MG tablet Every other Wednesday 15 tablet 3  . metoprolol (LOPRESSOR) 50 MG tablet Take 1 tablet (50 mg total) by mouth 2 (two) times daily. 60 tablet 3  . Multiple Vitamins-Iron (MULTIVITAMIN/IRON PO) Take 1 tablet by mouth daily.    Marland Kitchen omeprazole (PRILOSEC) 20 MG capsule Take 20 mg by mouth daily.    . OXYGEN Inhale 2 L into the lungs continuous.    . potassium chloride SA (K-DUR,KLOR-CON) 20 MEQ tablet Take 20 mEq by mouth 3 (three) times daily. MAKE SURE TO TAKE EXTRA 20 MEQ  ON METOLAZONE DAYS    . tiotropium (SPIRIVA) 18 MCG inhalation capsule Place 1 capsule (18 mcg total) into inhaler and inhale daily. 30 capsule 5  . vitamin B-12 (CYANOCOBALAMIN) 500 MCG tablet Take 500 mcg by mouth daily.     No current facility-administered medications for this encounter.     Vitals:   07/17/17 0924  BP: (!) 124/56  Pulse: 62  SpO2: 99%  Weight: (!) 300 lb 8 oz (136.3 kg)   Wt Readings from Last 3 Encounters:  07/17/17 (!) 300 lb 8 oz (136.3 kg)  04/10/17 296 lb 12 oz (134.6 kg)  04/03/17 296 lb 12.8 oz (134.6 kg)   General:  NAD. On 2L 02 via [place]. Niece present.   Neck: Thick, JVP not elevated, no thyromegaly or thyroid nodule.  Lungs: Decreased throughout.  CV: Nondisplaced PMI.  Heart regular S1/S2, no S3/S4, 2/6 early SEM RUSB with clearly heard S2.  No carotid bruit.   Abdomen: Soft, NT, ND, no HSM. No bruits or masses. +BS  Skin: Intact without lesions or rashes.  Neurologic: Alert and oriented x 3.  Psych: Normal affect. Extremities: No clubbing or cyanosis. Trace ankle edema.   Assessment/Plan:  1. Atrial fibrillation: Paroxysmal.  NSR today. - Continue amiodarone 200 mg daily. LFTs/TSH today.  Will need regular eye exam.  - Continue Eliquis 5 mg bid.  2. Chronic diastolic CHF: Last echo in 5/18 with EF 50-55% with moderate AS and moderate MR. Volume status difficult to discern due to body habitus. Weight is up 4 lbs and she reports increased dyspnea.  - We  discussed CardioMems to help manage her CHF.  She does not want to do this.  - Continue lasix 160 mg BID + metolazone 2.5 mg every other week.   We discussed increasing metolazone to weekly but she does not want to do this.   3. CKD stage III: BMET today.   4. COPD: Continues home oxygen.   5. Aortic Stenosis: Moderate on 5/18 echo.    6. Morbid Obesity: Continue weight loss efforts.    Followup in 3 months.    Loralie Champagne, MD  07/18/2017

## 2017-07-24 DIAGNOSIS — R7989 Other specified abnormal findings of blood chemistry: Secondary | ICD-10-CM | POA: Diagnosis not present

## 2017-07-31 ENCOUNTER — Ambulatory Visit (HOSPITAL_BASED_OUTPATIENT_CLINIC_OR_DEPARTMENT_OTHER): Payer: Medicare Other | Admitting: Hematology & Oncology

## 2017-07-31 ENCOUNTER — Ambulatory Visit (HOSPITAL_BASED_OUTPATIENT_CLINIC_OR_DEPARTMENT_OTHER): Payer: Medicare Other

## 2017-07-31 ENCOUNTER — Other Ambulatory Visit (HOSPITAL_BASED_OUTPATIENT_CLINIC_OR_DEPARTMENT_OTHER): Payer: Medicare Other

## 2017-07-31 VITALS — BP 107/38 | HR 68 | Temp 97.7°F | Resp 22 | Wt 306.0 lb

## 2017-07-31 DIAGNOSIS — Z7901 Long term (current) use of anticoagulants: Secondary | ICD-10-CM | POA: Diagnosis not present

## 2017-07-31 DIAGNOSIS — D631 Anemia in chronic kidney disease: Secondary | ICD-10-CM

## 2017-07-31 DIAGNOSIS — D508 Other iron deficiency anemias: Secondary | ICD-10-CM

## 2017-07-31 DIAGNOSIS — R042 Hemoptysis: Secondary | ICD-10-CM

## 2017-07-31 DIAGNOSIS — N183 Chronic kidney disease, stage 3 unspecified: Secondary | ICD-10-CM

## 2017-07-31 LAB — CBC WITH DIFFERENTIAL (CANCER CENTER ONLY)
BASO#: 0 10*3/uL (ref 0.0–0.2)
BASO%: 0.4 % (ref 0.0–2.0)
EOS ABS: 0.2 10*3/uL (ref 0.0–0.5)
EOS%: 2.3 % (ref 0.0–7.0)
HEMATOCRIT: 31.3 % — AB (ref 34.8–46.6)
HEMOGLOBIN: 9.8 g/dL — AB (ref 11.6–15.9)
LYMPH#: 0.7 10*3/uL — AB (ref 0.9–3.3)
LYMPH%: 7 % — ABNORMAL LOW (ref 14.0–48.0)
MCH: 31.5 pg (ref 26.0–34.0)
MCHC: 31.3 g/dL — AB (ref 32.0–36.0)
MCV: 101 fL (ref 81–101)
MONO#: 0.8 10*3/uL (ref 0.1–0.9)
MONO%: 7.7 % (ref 0.0–13.0)
NEUT%: 82.6 % — ABNORMAL HIGH (ref 39.6–80.0)
NEUTROS ABS: 8.3 10*3/uL — AB (ref 1.5–6.5)
Platelets: 244 10*3/uL (ref 145–400)
RBC: 3.11 10*6/uL — AB (ref 3.70–5.32)
RDW: 15.4 % (ref 11.1–15.7)
WBC: 10.1 10*3/uL — AB (ref 3.9–10.0)

## 2017-07-31 LAB — COMPREHENSIVE METABOLIC PANEL
ALBUMIN: 3.2 g/dL — AB (ref 3.5–5.0)
ALK PHOS: 69 U/L (ref 40–150)
ALT: 14 U/L (ref 0–55)
AST: 14 U/L (ref 5–34)
Anion Gap: 9 mEq/L (ref 3–11)
BILIRUBIN TOTAL: 0.54 mg/dL (ref 0.20–1.20)
BUN: 42.7 mg/dL — AB (ref 7.0–26.0)
CALCIUM: 10.1 mg/dL (ref 8.4–10.4)
CO2: 35 mEq/L — ABNORMAL HIGH (ref 22–29)
CREATININE: 1.5 mg/dL — AB (ref 0.6–1.1)
Chloride: 97 mEq/L — ABNORMAL LOW (ref 98–109)
EGFR: 33 mL/min/{1.73_m2} — ABNORMAL LOW (ref >90)
GLUCOSE: 87 mg/dL (ref 70–140)
Potassium: 4.2 mEq/L (ref 3.5–5.1)
SODIUM: 140 meq/L (ref 136–145)
TOTAL PROTEIN: 7.2 g/dL (ref 6.4–8.3)

## 2017-07-31 LAB — IRON AND TIBC
%SAT: 20 % — ABNORMAL LOW (ref 21–57)
Iron: 49 ug/dL (ref 41–142)
TIBC: 239 ug/dL (ref 236–444)
UIBC: 190 ug/dL (ref 120–384)

## 2017-07-31 LAB — FERRITIN: FERRITIN: 368 ng/mL — AB (ref 9–269)

## 2017-07-31 MED ORDER — DARBEPOETIN ALFA 300 MCG/0.6ML IJ SOSY
300.0000 ug | PREFILLED_SYRINGE | Freq: Once | INTRAMUSCULAR | Status: AC
  Administered 2017-07-31: 300 ug via SUBCUTANEOUS

## 2017-07-31 MED ORDER — DARBEPOETIN ALFA 300 MCG/0.6ML IJ SOSY
PREFILLED_SYRINGE | INTRAMUSCULAR | Status: AC
  Filled 2017-07-31: qty 0.6

## 2017-07-31 NOTE — Progress Notes (Signed)
Hematology and Oncology Follow Up Visit  CAREE WOLPERT [id number] 20-Jul-1946 71 y.o. 07/31/2017   Principle Diagnosis:  Anemia secondary to iron deficiency  Anemia secondary to renal insufficiency  Chronic anticoagulation for paroxysmal atrial fibrillation  Current Therapy:   IV iron as indicated - last received in Mar 30, 2017 Aranesp 300 mcg subcutaneous as needed for hemoglobin less than 11  ELIQUIS - life long   Interim History:  Ms. Fulmore is here today with a niece for follow-up. We last saw her back in May.  She really does not do much this summer. She has not gone anywhere. She is living at assisted living. She is at [facility].  She is on oxygen. She does have cardiac issues. She has a lot of edema in her legs.  Her last iron studies that we did on her back in May showed a ferritin of 186 with iron saturation of only 20%. She did get a dose of iron.  She's had no nausea or vomiting. She's had no cough.  She's had no change in bowel or bladder habits.  She is on quite a few medications. She is on amiodarone for the atrial fibrillation. It seems like the atrial fibrillation really has not been much of a problem for her.  She is on ELIQUIS. There's been no obvious bleeding on the ELIQUIS.  She is in a wheelchair. I was stated that her overall performance status is ECOG 3   Medications:  Allergies as of 07/31/2017      Reactions   Ace Inhibitors Cough   Clindamycin Rash      Medication List       Accurate as of 07/31/17 12:04 PM. Always use your most recent med list.          acetaminophen 325 MG tablet Commonly known as:  TYLENOL Take 325 mg by mouth daily as needed for mild pain.   albuterol (2.5 MG/3ML) 0.083% nebulizer solution Commonly known as:  PROVENTIL Take 2.5 mg by nebulization every 6 (six) hours as needed for wheezing or shortness of breath.   albuterol 108 (90 Base) MCG/ACT inhaler Commonly known as:  PROVENTIL HFA;VENTOLIN HFA Inhale 2  puffs into the lungs every 6 (six) hours as needed for wheezing or shortness of breath.   allopurinol 100 MG tablet Commonly known as:  ZYLOPRIM Take 1 tablet (100 mg total) by mouth daily.   amiodarone 200 MG tablet Commonly known as:  PACERONE Take 1 tablet (200 mg total) by mouth daily.   apixaban 5 MG Tabs tablet Commonly known as:  ELIQUIS Take 1 tablet (5 mg total) by mouth 2 (two) times daily.   arformoterol 15 MCG/2ML Nebu Commonly known as:  BROVANA Take 2 mLs (15 mcg total) by nebulization 2 (two) times daily.   benzonatate 200 MG capsule Commonly known as:  TESSALON Take 1 capsule (200 mg total) by mouth 2 (two) times daily as needed for cough.   budesonide-formoterol 160-4.5 MCG/ACT inhaler Commonly known as:  SYMBICORT Inhale 2 puffs into the lungs 2 (two) times daily.   calcium-vitamin D 500-200 MG-UNIT tablet Commonly known as:  OSCAL WITH D Take 1 tablet by mouth daily with breakfast.   DIABETIC TUSSIN EX 100 MG/5ML liquid Generic drug:  guaiFENesin Take 200 mg by mouth 3 (three) times daily as needed for cough.   diltiazem 180 MG 24 hr capsule Commonly known as:  CARDIZEM CD Take 1 capsule (180 mg total) by mouth daily.  ezetimibe-simvastatin 10-40 MG tablet Commonly known as:  VYTORIN Take 1 tablet by mouth at bedtime.   ferrous sulfate 325 (65 FE) MG tablet Take 325 mg by mouth 2 (two) times daily with a meal.   furosemide 80 MG tablet Commonly known as:  LASIX Take 2 tablets (160 mg total) by mouth 2 (two) times daily. MAKE SURE TO TAKE AT 8 AM AND 2 PM   insulin aspart 100 UNIT/ML injection Commonly known as:  novoLOG Inject 0-9 Units into the skin 3 (three) times daily with meals. Correction factor Sliding scale CBG 70 - 120: 0 units CBG 121 - 150: 1 unit,  CBG 151 - 200: 2 units,  CBG 201 - 250: 3 units,  CBG 251 - 300: 5 units,  CBG 301 - 350: 7 units,  CBG 351 - 400: 9 units   CBG > 400: 9 units and notify your MD   insulin NPH Human 100  UNIT/ML injection Commonly known as:  HUMULIN N,NOVOLIN N Inject 20 Units into the skin 2 (two) times daily before a meal.   metolazone 2.5 MG tablet Commonly known as:  ZAROXOLYN Every other Wednesday   metoprolol tartrate 50 MG tablet Commonly known as:  LOPRESSOR Take 1 tablet (50 mg total) by mouth 2 (two) times daily.   MULTIVITAMIN/IRON PO Take 1 tablet by mouth daily.   omeprazole 20 MG capsule Commonly known as:  PRILOSEC Take 20 mg by mouth daily.   OXYGEN Inhale 2 L into the lungs continuous.   potassium chloride SA 20 MEQ tablet Commonly known as:  K-DUR,KLOR-CON Take 20 mEq by mouth 3 (three) times daily. MAKE SURE TO TAKE EXTRA 20 MEQ ON METOLAZONE DAYS   tiotropium 18 MCG inhalation capsule Commonly known as:  SPIRIVA Place 1 capsule (18 mcg total) into inhaler and inhale daily.   vitamin B-12 500 MCG tablet Commonly known as:  CYANOCOBALAMIN Take 500 mcg by mouth daily.   Vitamin C 500 MG Caps Take 500 mg by mouth daily.   Vitamin D 2000 units tablet Take 2,000 Units by mouth daily.            Discharge Care Instructions        Start     Ordered   07/31/17 0000  CBC with Differential ([facility])     07/31/17 1204   07/31/17 0000  CMP STAT ([place] only)     07/31/17 1204   07/31/17 0000  Ferritin     07/31/17 1204   07/31/17 0000  Iron and TIBC     07/31/17 1204      Allergies:  Allergies  Allergen Reactions  . Ace Inhibitors Cough  . Clindamycin Rash    Past Medical History, Surgical history, Social history, and Family History were reviewed and updated.  Review of Systems: As stated in the interim history  Physical Exam:  weight is 306 lb (138.8 kg) (abnormal). Her oral temperature is 97.7 F (36.5 C). Her blood pressure is 107/38 (abnormal) and her pulse is 68. Her respiration is 22 (abnormal) and oxygen saturation is 96%.   Wt Readings from Last 3 Encounters:  07/31/17 (!) 306 lb (138.8 kg)    07/17/17 (!) 300 lb 8 oz (136.3 kg)  04/10/17 296 lb 12 oz (134.6 kg)    Well-developed and well-nourished [place] female. Head and neck exam shows no ocular or oral lesions. There are no palpable cervical or supraclavicular lymph nodes. Lungs are with decreased breath sounds  at the bases. She has some wheezing bilaterally. Cardiac exam shows regular rate and rhythm. I really do not detect any atrial fibrillation. She has a 1/6 systolic ejection murmur. Abdomen is soft. She is mildly obese. She has no fluid wave. There is no guarding. There is no palpable liver or spleen tip. Back exam shows no tenderness over the spine, ribs or hips. Extremities shows 2-3+ edema in her lower legs. This is chronic brawny edema. Skin exam shows no rashes. She has no ecchymoses or petechia. Neurological exam shows no focal neurological deficits.   Lab Results  Component Value Date   WBC 10.1 (H) 07/31/2017   HGB 9.8 (L) 07/31/2017   HCT 31.3 (L) 07/31/2017   MCV 101 07/31/2017   PLT 244 07/31/2017   Lab Results  Component Value Date   FERRITIN 186 03/27/2017   IRON 53 03/27/2017   TIBC 265 03/27/2017   UIBC 212 03/27/2017   IRONPCTSAT 20 (L) 03/27/2017   Lab Results  Component Value Date   RETICCTPCT 2.3 09/21/2015   RBC 3.11 (L) 07/31/2017   RETICCTABS 89.0 09/21/2015   No results found for: KPAFRELGTCHN, LAMBDASER, KAPLAMBRATIO No results found for: IGGSERUM, IGA, IGMSERUM No results found for: Odetta Pink, SPEI   Chemistry      Component Value Date/Time   NA 138 07/17/2017 1009   NA 138 03/27/2017 1020   K 3.9 07/17/2017 1009   K 4.1 03/27/2017 1020   CL 97 (L) 07/17/2017 1009   CL 96 (L) 12/22/2014 1019   CO2 31 07/17/2017 1009   CO2 31 (H) 03/27/2017 1020   BUN 46 (H) 07/17/2017 1009   BUN 43.0 (H) 03/27/2017 1020   CREATININE 1.74 (H) 07/17/2017 1009   CREATININE 1.7 (H) 03/27/2017 1020      Component Value Date/Time   CALCIUM  9.6 07/17/2017 1009   CALCIUM 9.9 03/27/2017 1020   ALKPHOS 66 07/17/2017 1009   ALKPHOS 66 03/27/2017 1020   AST 20 07/17/2017 1009   AST 16 03/27/2017 1020   ALT 17 07/17/2017 1009   ALT 15 03/27/2017 1020   BILITOT 0.6 07/17/2017 1009   BILITOT 0.38 03/27/2017 1020      Impression and Plan: Ms. Yarborough is a very pleasant 71 year old [place] female. She has multiple medical problems.  We will go ahead and give her Aranesp today. I think everything get her hemoglobin better, then some of her symptoms will also improve. She is agreeable to this.  I would not think that she is iron deficient. However, we will have to see what her iron studies are. With her being on ELIQUIS, bleeding is a was a factor.  I will let see her back in 6 weeks. I think we have to maintain a little bit more of a closer follow-up with her so that we can try to ameliorate her anemia and take stress off her heart and lungs.    Volanda Napoleon, MD 9/4/201812:04 PM

## 2017-07-31 NOTE — Patient Instructions (Signed)

## 2017-08-03 ENCOUNTER — Other Ambulatory Visit (HOSPITAL_COMMUNITY): Payer: Self-pay | Admitting: Cardiology

## 2017-08-07 ENCOUNTER — Telehealth: Payer: Self-pay | Admitting: *Deleted

## 2017-08-07 NOTE — Telephone Encounter (Signed)
-----   Message from Eliezer Bottom, NP sent at 08/06/2017  4:53 PM EDT ----- Regarding: Iron  Iron studies low. She will need one dose of IV iron. LOS sent to [facility]. Thank you!  Sarah  ----- Message ----- From: Interface, Lab In Three Zero One Sent: 07/31/2017  10:38 AM To: Eliezer Bottom, NP

## 2017-08-07 NOTE — Telephone Encounter (Signed)
Left message on patient personal cell phone to call us back with this message.

## 2017-08-14 ENCOUNTER — Ambulatory Visit (HOSPITAL_BASED_OUTPATIENT_CLINIC_OR_DEPARTMENT_OTHER): Payer: Medicare Other

## 2017-08-14 VITALS — BP 117/40 | HR 70 | Temp 98.1°F | Resp 22

## 2017-08-14 DIAGNOSIS — D508 Other iron deficiency anemias: Secondary | ICD-10-CM

## 2017-08-14 MED ORDER — SODIUM CHLORIDE 0.9 % IV SOLN
510.0000 mg | Freq: Once | INTRAVENOUS | Status: AC
Start: 2017-08-14 — End: 2017-08-14
  Administered 2017-08-14: 510 mg via INTRAVENOUS
  Filled 2017-08-14: qty 17

## 2017-08-14 NOTE — Patient Instructions (Signed)

## 2017-08-27 DIAGNOSIS — B351 Tinea unguium: Secondary | ICD-10-CM | POA: Diagnosis not present

## 2017-08-27 DIAGNOSIS — L84 Corns and callosities: Secondary | ICD-10-CM | POA: Diagnosis not present

## 2017-09-09 DIAGNOSIS — Z23 Encounter for immunization: Secondary | ICD-10-CM | POA: Diagnosis not present

## 2017-09-11 ENCOUNTER — Ambulatory Visit (HOSPITAL_BASED_OUTPATIENT_CLINIC_OR_DEPARTMENT_OTHER): Payer: Medicare Other

## 2017-09-11 ENCOUNTER — Ambulatory Visit (HOSPITAL_BASED_OUTPATIENT_CLINIC_OR_DEPARTMENT_OTHER): Payer: Medicare Other | Admitting: Hematology & Oncology

## 2017-09-11 ENCOUNTER — Other Ambulatory Visit (HOSPITAL_BASED_OUTPATIENT_CLINIC_OR_DEPARTMENT_OTHER): Payer: Medicare Other

## 2017-09-11 VITALS — BP 105/42 | HR 63 | Temp 97.9°F | Resp 20 | Wt 300.0 lb

## 2017-09-11 DIAGNOSIS — D500 Iron deficiency anemia secondary to blood loss (chronic): Secondary | ICD-10-CM

## 2017-09-11 DIAGNOSIS — N183 Chronic kidney disease, stage 3 unspecified: Secondary | ICD-10-CM

## 2017-09-11 DIAGNOSIS — I272 Pulmonary hypertension, unspecified: Secondary | ICD-10-CM

## 2017-09-11 DIAGNOSIS — N189 Chronic kidney disease, unspecified: Secondary | ICD-10-CM | POA: Diagnosis not present

## 2017-09-11 DIAGNOSIS — Z7901 Long term (current) use of anticoagulants: Secondary | ICD-10-CM | POA: Diagnosis not present

## 2017-09-11 DIAGNOSIS — I480 Paroxysmal atrial fibrillation: Secondary | ICD-10-CM

## 2017-09-11 DIAGNOSIS — R042 Hemoptysis: Secondary | ICD-10-CM

## 2017-09-11 DIAGNOSIS — D631 Anemia in chronic kidney disease: Secondary | ICD-10-CM

## 2017-09-11 DIAGNOSIS — D508 Other iron deficiency anemias: Secondary | ICD-10-CM

## 2017-09-11 LAB — CBC WITH DIFFERENTIAL (CANCER CENTER ONLY)
BASO#: 0 10*3/uL (ref 0.0–0.2)
BASO%: 0.3 % (ref 0.0–2.0)
EOS ABS: 0.2 10*3/uL (ref 0.0–0.5)
EOS%: 1.8 % (ref 0.0–7.0)
HCT: 33.6 % — ABNORMAL LOW (ref 34.8–46.6)
HEMOGLOBIN: 10.2 g/dL — AB (ref 11.6–15.9)
LYMPH#: 0.6 10*3/uL — ABNORMAL LOW (ref 0.9–3.3)
LYMPH%: 6 % — AB (ref 14.0–48.0)
MCH: 31 pg (ref 26.0–34.0)
MCHC: 30.4 g/dL — AB (ref 32.0–36.0)
MCV: 102 fL — ABNORMAL HIGH (ref 81–101)
MONO#: 0.7 10*3/uL (ref 0.1–0.9)
MONO%: 6.9 % (ref 0.0–13.0)
NEUT#: 8.4 10*3/uL — ABNORMAL HIGH (ref 1.5–6.5)
NEUT%: 85 % — AB (ref 39.6–80.0)
Platelets: 259 10*3/uL (ref 145–400)
RBC: 3.29 10*6/uL — ABNORMAL LOW (ref 3.70–5.32)
RDW: 16.9 % — AB (ref 11.1–15.7)
WBC: 9.9 10*3/uL (ref 3.9–10.0)

## 2017-09-11 LAB — CMP (CANCER CENTER ONLY)
ALBUMIN: 3.3 g/dL (ref 3.3–5.5)
ALT(SGPT): 19 U/L (ref 10–47)
AST: 23 U/L (ref 11–38)
Alkaline Phosphatase: 69 U/L (ref 26–84)
BILIRUBIN TOTAL: 0.6 mg/dL (ref 0.20–1.60)
BUN, Bld: 54 mg/dL — ABNORMAL HIGH (ref 7–22)
CHLORIDE: 94 meq/L — AB (ref 98–108)
CO2: 35 meq/L — AB (ref 18–33)
CREATININE: 2.2 mg/dL — AB (ref 0.6–1.2)
Calcium: 9.6 mg/dL (ref 8.0–10.3)
GLUCOSE: 120 mg/dL — AB (ref 73–118)
Potassium: 4.4 mEq/L (ref 3.3–4.7)
SODIUM: 142 meq/L (ref 128–145)
Total Protein: 7.5 g/dL (ref 6.4–8.1)

## 2017-09-11 LAB — IRON AND TIBC
%SAT: 16 % — AB (ref 21–57)
IRON: 41 ug/dL (ref 41–142)
TIBC: 250 ug/dL (ref 236–444)
UIBC: 209 ug/dL (ref 120–384)

## 2017-09-11 LAB — FERRITIN: Ferritin: 392 ng/ml — ABNORMAL HIGH (ref 9–269)

## 2017-09-11 MED ORDER — DARBEPOETIN ALFA 300 MCG/0.6ML IJ SOSY
PREFILLED_SYRINGE | INTRAMUSCULAR | Status: AC
  Filled 2017-09-11: qty 0.6

## 2017-09-11 MED ORDER — DARBEPOETIN ALFA 300 MCG/0.6ML IJ SOSY
300.0000 ug | PREFILLED_SYRINGE | Freq: Once | INTRAMUSCULAR | Status: AC
  Administered 2017-09-11: 300 ug via SUBCUTANEOUS

## 2017-09-11 NOTE — Patient Instructions (Signed)

## 2017-09-11 NOTE — Progress Notes (Signed)
Hematology and Oncology Follow Up Visit  Paige Hardin [id number] [birth year] 71 y.o. 09/11/2017   Principle Diagnosis:  Anemia secondary to iron deficiency  Anemia secondary to renal insufficiency  Chronic anticoagulation for paroxysmal atrial fibrillation  Current Therapy:   IV iron as indicated - last received in Mar 30, 2017 Aranesp 300 mcg subcutaneous as needed for hemoglobin less than 11  ELIQUIS - life long   Interim History:  Paige Hardin is here today with a niece for follow-up. As always, she comes in a wheelchair.  We last saw her in early September, her iron studies show that she was borderline low for iron. Her ferritin was 368. A lot of this ferritin elevation is acute phase reactant. Her iron saturation was only 20%. We did go ahead and give her a dose of IV iron in September.  She got Aranesp in September.  She feels better. She feels like she can breathe a little bit easier.  She is on a ton of medications. She would like to get off a lot of these medications. I told her that her other doctors would have to arrange for this if possible.  She is lost 10 pounds over the past couple weeks. A lot of this is fluid loss.  She's had no diarrhea. She's had no fever. She's had no bleeding.  Her appetite seems to be doing pretty well.   Overall, her performance status is ECOG 3   Medications:  Allergies as of 09/11/2017      Reactions   Ace Inhibitors Cough   Clindamycin Rash      Medication List       Accurate as of 09/11/17  1:04 PM. Always use your most recent med list.          acetaminophen 325 MG tablet Commonly known as:  TYLENOL Take 325 mg by mouth daily as needed for mild pain.   albuterol (2.5 MG/3ML) 0.083% nebulizer solution Commonly known as:  PROVENTIL Take 2.5 mg by nebulization every 6 (six) hours as needed for wheezing or shortness of breath.   albuterol 108 (90 Base) MCG/ACT inhaler Commonly known as:  PROVENTIL HFA;VENTOLIN  HFA Inhale 2 puffs into the lungs every 6 (six) hours as needed for wheezing or shortness of breath.   allopurinol 100 MG tablet Commonly known as:  ZYLOPRIM Take 1 tablet (100 mg total) by mouth daily.   amiodarone 200 MG tablet Commonly known as:  PACERONE Take 1 tablet (200 mg total) by mouth daily.   apixaban 5 MG Tabs tablet Commonly known as:  ELIQUIS Take 1 tablet (5 mg total) by mouth 2 (two) times daily.   arformoterol 15 MCG/2ML Nebu Commonly known as:  BROVANA Take 2 mLs (15 mcg total) by nebulization 2 (two) times daily.   benzonatate 200 MG capsule Commonly known as:  TESSALON Take 1 capsule (200 mg total) by mouth 2 (two) times daily as needed for cough.   budesonide-formoterol 160-4.5 MCG/ACT inhaler Commonly known as:  SYMBICORT Inhale 2 puffs into the lungs 2 (two) times daily.   calcium-vitamin D 500-200 MG-UNIT tablet Commonly known as:  OSCAL WITH D Take 1 tablet by mouth daily with breakfast.   DIABETIC TUSSIN EX 100 MG/5ML liquid Generic drug:  guaiFENesin Take 200 mg by mouth 3 (three) times daily as needed for cough.   diltiazem 180 MG 24 hr capsule Commonly known as:  CARDIZEM CD Take 1 capsule (180 mg total) by mouth daily.   ezetimibe-simvastatin  10-40 MG tablet Commonly known as:  VYTORIN Take 1 tablet by mouth at bedtime.   ferrous sulfate 325 (65 FE) MG tablet Take 325 mg by mouth 2 (two) times daily with a meal.   furosemide 80 MG tablet Commonly known as:  LASIX Take 2 tablets (160 mg total) by mouth 2 (two) times daily. MAKE SURE TO TAKE AT 8 AM AND 2 PM   insulin aspart 100 UNIT/ML injection Commonly known as:  novoLOG Inject 0-9 Units into the skin 3 (three) times daily with meals. Correction factor Sliding scale CBG 70 - 120: 0 units CBG 121 - 150: 1 unit,  CBG 151 - 200: 2 units,  CBG 201 - 250: 3 units,  CBG 251 - 300: 5 units,  CBG 301 - 350: 7 units,  CBG 351 - 400: 9 units   CBG > 400: 9 units and notify your MD   insulin  NPH Human 100 UNIT/ML injection Commonly known as:  HUMULIN N,NOVOLIN N Inject 20 Units into the skin 2 (two) times daily before a meal.   metolazone 2.5 MG tablet Commonly known as:  ZAROXOLYN Every other Wednesday   metoprolol tartrate 50 MG tablet Commonly known as:  LOPRESSOR Take 1 tablet (50 mg total) by mouth 2 (two) times daily.   MULTIVITAMIN/IRON PO Take 1 tablet by mouth daily.   omeprazole 20 MG capsule Commonly known as:  PRILOSEC Take 20 mg by mouth daily.   OXYGEN Inhale 2 L into the lungs continuous.   potassium chloride SA 20 MEQ tablet Commonly known as:  K-DUR,KLOR-CON Take 20 mEq by mouth 3 (three) times daily. MAKE SURE TO TAKE EXTRA 20 MEQ ON METOLAZONE DAYS   tiotropium 18 MCG inhalation capsule Commonly known as:  SPIRIVA Place 1 capsule (18 mcg total) into inhaler and inhale daily.   vitamin B-12 500 MCG tablet Commonly known as:  CYANOCOBALAMIN Take 500 mcg by mouth daily.   Vitamin C 500 MG Caps Take 500 mg by mouth daily.   Vitamin D 2000 units tablet Take 2,000 Units by mouth daily.       Allergies:  Allergies  Allergen Reactions  . Ace Inhibitors Cough  . Clindamycin Rash    Past Medical History, Surgical history, Social history, and Family History were reviewed and updated.  Review of Systems: As stated in the interim history  Physical Exam:  weight is 300 lb (136.1 kg). Her oral temperature is 97.9 F (36.6 C). Her blood pressure is 105/42 (abnormal) and her pulse is 63. Her respiration is 20 and oxygen saturation is 94%.   Wt Readings from Last 3 Encounters:  09/11/17 300 lb (136.1 kg)  07/31/17 (!) 306 lb (138.8 kg)  07/17/17 (!) 300 lb 8 oz (136.3 kg)    I examined Paige Hardin. The results of my examination are noted below with appropriate changes:   Well-developed and well-nourished Paige Hardin female. Head and neck exam shows no ocular or oral lesions. There are no palpable cervical or supraclavicular lymph nodes. Lungs  are with decreased breath sounds at the bases. She has some wheezing bilaterally. Cardiac exam shows regular rate and rhythm. I really do not detect any atrial fibrillation. She has a 1/6 systolic ejection murmur. Abdomen is soft. She is moderately obese. She has no fluid wave. There is no guarding. There is no palpable liver or spleen tip. Back exam shows no tenderness over the spine, ribs or hips. Extremities shows 1-2+ edema in her lower legs. This is  chronic brawny edema. She may have some stasis dermatitis changes. Skin exam shows no rashes. She has no ecchymoses or petechia. Neurological exam shows no focal neurological deficits.   Lab Results  Component Value Date   WBC 9.9 09/11/2017   HGB 10.2 (L) 09/11/2017   HCT 33.6 (L) 09/11/2017   MCV 102 (H) 09/11/2017   PLT 259 09/11/2017   Lab Results  Component Value Date   FERRITIN 368 (H) 07/31/2017   IRON 49 07/31/2017   TIBC 239 07/31/2017   UIBC 190 07/31/2017   IRONPCTSAT 20 (L) 07/31/2017   Lab Results  Component Value Date   RETICCTPCT 2.3 09/21/2015   RBC 3.29 (L) 09/11/2017   RETICCTABS 89.0 09/21/2015   No results found for: KPAFRELGTCHN, LAMBDASER, KAPLAMBRATIO No results found for: IGGSERUM, IGA, IGMSERUM No results found for: Odetta Pink, SPEI   Chemistry      Component Value Date/Time   NA 142 09/11/2017 1148   NA 140 07/31/2017 1028   K 4.4 09/11/2017 1148   K 4.2 07/31/2017 1028   CL 94 (L) 09/11/2017 1148   CO2 35 (H) 09/11/2017 1148   CO2 35 (H) 07/31/2017 1028   BUN 54 (H) 09/11/2017 1148   BUN 42.7 (H) 07/31/2017 1028   CREATININE 2.2 (H) 09/11/2017 1148   CREATININE 1.5 (H) 07/31/2017 1028      Component Value Date/Time   CALCIUM 9.6 09/11/2017 1148   CALCIUM 10.1 07/31/2017 1028   ALKPHOS 69 09/11/2017 1148   ALKPHOS 69 07/31/2017 1028   AST 23 09/11/2017 1148   AST 14 07/31/2017 1028   ALT 19 09/11/2017 1148   ALT 14 07/31/2017 1028    BILITOT 0.60 09/11/2017 1148   BILITOT 0.54 07/31/2017 1028      Impression and Plan: Ms. Marlett is a very pleasant 71 year old [place] female. She has multiple medical problems.  We will go ahead and give her Aranesp today. I think everything get her hemoglobin better, then some of her symptoms will also improve. She is agreeable to this.  Given that she had iron about a month ago, I would like to think that he Aranesp should be able to work quite nicely.  I would like to get her back to be seen before Thanksgiving. I want to make sure that we follow her closely so we can make sure that her blood is as optimal as possible for the holidays.   I'm just glad that she is feeling good and that she, as always, is in good spirits.     Volanda Napoleon, MD 10/16/20181:04 PM

## 2017-09-18 ENCOUNTER — Ambulatory Visit (HOSPITAL_BASED_OUTPATIENT_CLINIC_OR_DEPARTMENT_OTHER): Payer: Medicare Other

## 2017-09-18 VITALS — BP 133/52 | HR 70 | Resp 20

## 2017-09-18 DIAGNOSIS — D508 Other iron deficiency anemias: Secondary | ICD-10-CM | POA: Diagnosis present

## 2017-09-18 MED ORDER — SODIUM CHLORIDE 0.9 % IV SOLN
510.0000 mg | Freq: Once | INTRAVENOUS | Status: AC
  Administered 2017-09-18: 510 mg via INTRAVENOUS
  Filled 2017-09-18: qty 17

## 2017-09-18 NOTE — Patient Instructions (Signed)

## 2017-10-09 ENCOUNTER — Telehealth: Payer: Self-pay | Admitting: Emergency Medicine

## 2017-10-09 ENCOUNTER — Ambulatory Visit (INDEPENDENT_AMBULATORY_CARE_PROVIDER_SITE_OTHER): Payer: Medicare Other | Admitting: Emergency Medicine

## 2017-10-09 ENCOUNTER — Encounter: Payer: Self-pay | Admitting: Emergency Medicine

## 2017-10-09 DIAGNOSIS — R911 Solitary pulmonary nodule: Secondary | ICD-10-CM | POA: Diagnosis not present

## 2017-10-09 DIAGNOSIS — I480 Paroxysmal atrial fibrillation: Secondary | ICD-10-CM | POA: Diagnosis not present

## 2017-10-09 MED ORDER — TIOTROPIUM BROMIDE-OLODATEROL 2.5-2.5 MCG/ACT IN AERS: 2.0000 | INHALATION_SPRAY | Freq: Every day | RESPIRATORY_TRACT | 0 refills | Status: DC

## 2017-10-09 NOTE — Telephone Encounter (Signed)
Attempted to call [employer]. Received a busy signal x2. Will try back.

## 2017-10-09 NOTE — Assessment & Plan Note (Signed)
Please continue your oxygen 3-4L/min as you have been doing it Temporarily stop Symbicort, Spiriva We will do a trial of Stiolto, 2 puffs once a day to see if you benefit.  If so then please call our office and we will order this through your pharmacy at [facility] Flu shot up to date.

## 2017-10-09 NOTE — Assessment & Plan Note (Signed)
1 cm pulmonary nodule that have been stable on her most recent film.  Repeat CT scan is due.  She would like to defer this until after the new year.  We will perform this in January 2019.

## 2017-10-09 NOTE — Assessment & Plan Note (Signed)
No evidence to date of amiodarone toxicity.  We will be able to further evaluate when she has her scheduled CT scan of the chest in January 2019

## 2017-10-09 NOTE — Progress Notes (Signed)
Patient ID: Paige Hardin, female   DOB: December 06, 1945, 71 y.o.   MRN: [id number]   Subjective:    Patient ID: Paige Hardin, female    DOB: 02-08-1946, 71 y.o.   MRN: [id number]  HPI  [name] 04/03/17 - this is a follow-up visit for patient with a history of COPD and associated chronic hypoxemic respiratory failure. She also has allergic rhinitis with associated cough. We have also followed at a rounded solid nodule in the right lower lobe that had been slowly enlarging on CT scan of the chest. Her most recent CT was 09/12/16. I have reviewed. This shows that the 1 cm right lower lobe nodule had not significantly changed since 02/29/16 but had enlarged compared with more remote priors.  She tells me that she was hospitalized 19/62/22 for UTI, diastolic CHF. She improved but not quite back to her prior baseline. She is having exertional SOB, significant fatigue. She has been turning her o2 up to 4L/min with exertion. Needs to have her concentrator at [facility] turned up to 3L/min. She is using Spiriva, Symbicort, brovana / pulmocort. She has albuterol available, rarely uses. No flares since November 2017.   [name] 10/09/17 --patient has a history of hypertension, diastolic CHF, paroxysmal atrial fibrillation, iron deficiency anemia, renal insufficiency , COPD with associated chronic hypoxemic respiratory failure.  She also has a right lower lobe pulmonary nodule that we have been following with serial imaging. She believes that her breathing may be a bit worse than last visit. She has had some anemia- - was just treated with IV iron. She is having some exertional desaturations even on 4L/min. She has had some intermittent cough, hoarse voice. She uses albuterol rarely.    Review of Systems  Constitutional: Negative for fever and unexpected weight change.  HENT: Positive for congestion and postnasal drip. Negative for dental problem, ear pain, nosebleeds, rhinorrhea, sinus pressure, sneezing, sore throat and  trouble swallowing.   Eyes: Negative for redness and itching.  Respiratory: Positive for cough and shortness of breath. Negative for chest tightness and wheezing.   Cardiovascular: Negative for palpitations and leg swelling.  Gastrointestinal: Negative for nausea and vomiting.  Genitourinary: Negative for dysuria.  Musculoskeletal: Negative for joint swelling.  Skin: Negative for rash.  Neurological: Negative for headaches.  Hematological: Does not bruise/bleed easily.  Psychiatric/Behavioral: Negative for dysphoric mood. The patient is not nervous/anxious.        Objective:   Physical Exam     Vitals:   10/09/17 1016  BP: (!) 118/58  Pulse: (!) 59  SpO2: 92%  Weight: 297 lb (134.7 kg)  Height: 5\' 5"  (1.651 m)   Gen: Pleasant, Chronically ill-appearing, well-nourished, in no distress,  normal affect  ENT: No lesions,  mouth clear,  oropharynx clear, no postnasal drip  Neck: No JVD, no stridor  Lungs: No use of accessory muscles,clear without rales or rhonchi  Cardiovascular: RRR, heart sounds normal, no murmur or gallops, no peripheral edema  Musculoskeletal: No deformities, no cyanosis or clubbing  Neuro: alert, non focal  Skin: Warm, no lesions or rashes      Assessment & Plan:  COPD with emphysema  Please continue your oxygen 3-4L/min as you have been doing it Temporarily stop Symbicort, Spiriva We will do a trial of Stiolto, 2 puffs once a day to see if you benefit.  If so then please call our office and we will order this through your pharmacy at [facility] Flu shot up to date.  Solitary pulmonary nodule 1 cm pulmonary nodule that have been stable on her most recent film.  Repeat CT scan is due.  She would like to defer this until after the new year.  We will perform this in January 2019.  PAF (paroxysmal atrial fibrillation) s/p multiple DCCVs; on Amiodarone No evidence to date of amiodarone toxicity.  We will be able to further evaluate  when she has her scheduled CT scan of the chest in January 2019  Baltazar Apo, MD, PhD 10/09/2017, 10:56 AM [facility] [phone] or if no answer [phone]

## 2017-10-09 NOTE — Patient Instructions (Addendum)
Please continue your oxygen 3-4L/min as you have been doing it Temporarily stop Symbicort, Spiriva We will do a trial of Stiolto, 2 puffs once a day to see if you benefit.  If so then please call our office and we will order this through your pharmacy at [facility] Flu shot up to date.  We will repeat your Ct scan of the chest in January 2019   Follow with Dr Lamonte Sakai in January to review your CT scan and your status on the new inhaled medication

## 2017-10-10 NOTE — Telephone Encounter (Signed)
Attempted to contact [facility] x2. No answer, no option to leave a message. Will try back.

## 2017-10-10 NOTE — Telephone Encounter (Signed)
Spoke with [facility] she would like an order faxed to stop Symbicort and Spiriva and start Stiolto per RB note from today. This has been done and nothing further needed.   Assessment & Plan:  COPD with emphysema  Please continue your oxygen 3-4L/min as you have been doing it Temporarily stop Symbicort, Spiriva We will do a trial of Stiolto, 2 puffs once a day to see if you benefit.  If so then please call our office and we will order this through your pharmacy at [facility] Flu shot up to date.

## 2017-10-15 ENCOUNTER — Telehealth (HOSPITAL_COMMUNITY): Payer: Self-pay | Admitting: *Deleted

## 2017-10-15 NOTE — Telephone Encounter (Signed)
Received patient's daily weights, wt is stable at 294.2 lbs.

## 2017-10-16 ENCOUNTER — Other Ambulatory Visit: Payer: Self-pay

## 2017-10-16 ENCOUNTER — Other Ambulatory Visit (HOSPITAL_BASED_OUTPATIENT_CLINIC_OR_DEPARTMENT_OTHER): Payer: Medicare Other

## 2017-10-16 ENCOUNTER — Encounter: Payer: Self-pay | Admitting: Family

## 2017-10-16 ENCOUNTER — Ambulatory Visit (HOSPITAL_BASED_OUTPATIENT_CLINIC_OR_DEPARTMENT_OTHER): Payer: Medicare Other

## 2017-10-16 ENCOUNTER — Ambulatory Visit (HOSPITAL_BASED_OUTPATIENT_CLINIC_OR_DEPARTMENT_OTHER): Payer: Medicare Other | Admitting: Family

## 2017-10-16 VITALS — BP 100/41 | HR 67 | Temp 97.4°F | Resp 20 | Wt 301.0 lb

## 2017-10-16 DIAGNOSIS — I272 Pulmonary hypertension, unspecified: Secondary | ICD-10-CM

## 2017-10-16 DIAGNOSIS — I10 Essential (primary) hypertension: Secondary | ICD-10-CM | POA: Diagnosis not present

## 2017-10-16 DIAGNOSIS — Z7901 Long term (current) use of anticoagulants: Secondary | ICD-10-CM

## 2017-10-16 DIAGNOSIS — D500 Iron deficiency anemia secondary to blood loss (chronic): Secondary | ICD-10-CM | POA: Diagnosis not present

## 2017-10-16 DIAGNOSIS — M109 Gout, unspecified: Secondary | ICD-10-CM | POA: Diagnosis not present

## 2017-10-16 DIAGNOSIS — N189 Chronic kidney disease, unspecified: Secondary | ICD-10-CM

## 2017-10-16 DIAGNOSIS — D508 Other iron deficiency anemias: Secondary | ICD-10-CM

## 2017-10-16 DIAGNOSIS — N183 Chronic kidney disease, stage 3 unspecified: Secondary | ICD-10-CM

## 2017-10-16 DIAGNOSIS — D631 Anemia in chronic kidney disease: Secondary | ICD-10-CM | POA: Diagnosis not present

## 2017-10-16 DIAGNOSIS — I480 Paroxysmal atrial fibrillation: Secondary | ICD-10-CM

## 2017-10-16 DIAGNOSIS — L308 Other specified dermatitis: Secondary | ICD-10-CM | POA: Diagnosis not present

## 2017-10-16 DIAGNOSIS — I4891 Unspecified atrial fibrillation: Secondary | ICD-10-CM | POA: Diagnosis not present

## 2017-10-16 DIAGNOSIS — E119 Type 2 diabetes mellitus without complications: Secondary | ICD-10-CM | POA: Diagnosis not present

## 2017-10-16 DIAGNOSIS — E7849 Other hyperlipidemia: Secondary | ICD-10-CM | POA: Diagnosis not present

## 2017-10-16 DIAGNOSIS — R042 Hemoptysis: Secondary | ICD-10-CM

## 2017-10-16 DIAGNOSIS — J449 Chronic obstructive pulmonary disease, unspecified: Secondary | ICD-10-CM | POA: Diagnosis not present

## 2017-10-16 DIAGNOSIS — I5089 Other heart failure: Secondary | ICD-10-CM | POA: Diagnosis not present

## 2017-10-16 LAB — CMP (CANCER CENTER ONLY)
ALBUMIN: 3.3 g/dL (ref 3.3–5.5)
ALK PHOS: 68 U/L (ref 26–84)
ALT: 15 U/L (ref 10–47)
AST: 18 U/L (ref 11–38)
BUN, Bld: 40 mg/dL — ABNORMAL HIGH (ref 7–22)
CALCIUM: 9.5 mg/dL (ref 8.0–10.3)
CO2: 31 mEq/L (ref 18–33)
CREATININE: 1.7 mg/dL — AB (ref 0.6–1.2)
Chloride: 92 mEq/L — ABNORMAL LOW (ref 98–108)
Glucose, Bld: 132 mg/dL — ABNORMAL HIGH (ref 73–118)
POTASSIUM: 3.8 meq/L (ref 3.3–4.7)
Sodium: 141 mEq/L (ref 128–145)
TOTAL PROTEIN: 7.2 g/dL (ref 6.4–8.1)
Total Bilirubin: 0.6 mg/dl (ref 0.20–1.60)

## 2017-10-16 LAB — IRON AND TIBC
%SAT: 16 % — ABNORMAL LOW (ref 21–57)
IRON: 38 ug/dL — AB (ref 41–142)
TIBC: 241 ug/dL (ref 236–444)
UIBC: 203 ug/dL (ref 120–384)

## 2017-10-16 LAB — CBC WITH DIFFERENTIAL (CANCER CENTER ONLY)
BASO#: 0 10*3/uL (ref 0.0–0.2)
BASO%: 0.2 % (ref 0.0–2.0)
EOS%: 1.9 % (ref 0.0–7.0)
Eosinophils Absolute: 0.2 10*3/uL (ref 0.0–0.5)
HEMATOCRIT: 32.3 % — AB (ref 34.8–46.6)
HEMOGLOBIN: 10 g/dL — AB (ref 11.6–15.9)
LYMPH#: 0.6 10*3/uL — AB (ref 0.9–3.3)
LYMPH%: 7.2 % — ABNORMAL LOW (ref 14.0–48.0)
MCH: 30.9 pg (ref 26.0–34.0)
MCHC: 31 g/dL — ABNORMAL LOW (ref 32.0–36.0)
MCV: 100 fL (ref 81–101)
MONO#: 0.8 10*3/uL (ref 0.1–0.9)
MONO%: 8.6 % (ref 0.0–13.0)
NEUT%: 82.1 % — AB (ref 39.6–80.0)
NEUTROS ABS: 7.3 10*3/uL — AB (ref 1.5–6.5)
Platelets: 217 10*3/uL (ref 145–400)
RBC: 3.24 10*6/uL — AB (ref 3.70–5.32)
RDW: 16.3 % — ABNORMAL HIGH (ref 11.1–15.7)
WBC: 8.9 10*3/uL (ref 3.9–10.0)

## 2017-10-16 LAB — FERRITIN: FERRITIN: 430 ng/mL — AB (ref 9–269)

## 2017-10-16 MED ORDER — DARBEPOETIN ALFA 300 MCG/0.6ML IJ SOSY
300.0000 ug | PREFILLED_SYRINGE | Freq: Once | INTRAMUSCULAR | Status: AC
  Administered 2017-10-16: 300 ug via SUBCUTANEOUS

## 2017-10-16 MED ORDER — ALLOPURINOL 100 MG PO TABS: 100.0000 mg | ORAL_TABLET | Freq: Every day | ORAL | 0 refills | Status: AC

## 2017-10-16 MED ORDER — DARBEPOETIN ALFA 300 MCG/0.6ML IJ SOSY
PREFILLED_SYRINGE | INTRAMUSCULAR | Status: AC
  Filled 2017-10-16: qty 0.6

## 2017-10-16 NOTE — Progress Notes (Signed)
Hematology and Oncology Follow Up Visit  Paige Hardin [id number] November 21, 1946 71 y.o. 10/16/2017   Principle Diagnosis:  Anemia secondary to iron deficiency  Anemia secondary to renal insufficiency  Chronic anticoagulation for paroxysmal atrial fibrillation  Current Therapy:   IV iron as indicated - last received in October 2018 Aranesp 300 mcg subcutaneous as needed for hemoglobin less than 11  ELIQUIS - life long   Interim History:  Paige Hardin is here today with her sister for follow-up. She is doing fairly well but states that she has gout again in her right great toe that is quite painful. She has history of this and would like a refill on her allopurinol until she can get in with her new PCP at [facility].  She received both Aranesp and Feraheme in October and did nicely.  She states that her SOB is unchanged. She uses 4L [place] supplemental O2 when active and 2L [place] when sitting. She has a chronic dry cough due to COPD.  No episodes of bleeding, bruising or petechiae. No lymphadenopathy found on exam.  No fever, chills, n/v, rash, chest pain, palpitations, abdominal pain or changes in bowel or bladder habits.  She has chronic swelling in her lower extremities. This is unchanged. Pedal pulses are +1.  No numbness or tingling in her extremities.  She has maintained a good appetite and is staying well hydrated on fluid restrictions (2 L a day). Her weight is stable.   ECOG Performance Status: 1 - Symptomatic but completely ambulatory  Medications:  Allergies as of 10/16/2017      Reactions   Ace Inhibitors Cough   Clindamycin Rash      Medication List        Accurate as of 10/16/17 11:44 AM. Always use your most recent med list.          acetaminophen 325 MG tablet Commonly known as:  TYLENOL Take 325 mg by mouth daily as needed for mild pain.   albuterol (2.5 MG/3ML) 0.083% nebulizer solution Commonly known as:  PROVENTIL Take 2.5 mg by nebulization every 6 (six)  hours as needed for wheezing or shortness of breath.   albuterol 108 (90 Base) MCG/ACT inhaler Commonly known as:  PROVENTIL HFA;VENTOLIN HFA Inhale 2 puffs into the lungs every 6 (six) hours as needed for wheezing or shortness of breath.   allopurinol 100 MG tablet Commonly known as:  ZYLOPRIM Take 1 tablet (100 mg total) by mouth daily.   amiodarone 200 MG tablet Commonly known as:  PACERONE Take 1 tablet (200 mg total) by mouth daily.   apixaban 5 MG Tabs tablet Commonly known as:  ELIQUIS Take 1 tablet (5 mg total) by mouth 2 (two) times daily.   benzonatate 200 MG capsule Commonly known as:  TESSALON Take 1 capsule (200 mg total) by mouth 2 (two) times daily as needed for cough.   budesonide-formoterol 160-4.5 MCG/ACT inhaler Commonly known as:  SYMBICORT Inhale 2 puffs into the lungs 2 (two) times daily.   calcium-vitamin D 500-200 MG-UNIT tablet Commonly known as:  OSCAL WITH D Take 1 tablet by mouth daily with breakfast.   DIABETIC TUSSIN EX 100 MG/5ML liquid Generic drug:  guaiFENesin Take 200 mg by mouth 3 (three) times daily as needed for cough.   diltiazem 180 MG 24 hr capsule Commonly known as:  CARDIZEM CD Take 1 capsule (180 mg total) by mouth daily.   ezetimibe-simvastatin 10-40 MG tablet Commonly known as:  VYTORIN Take 1 tablet by  mouth at bedtime.   ferrous sulfate 325 (65 FE) MG tablet Take 325 mg by mouth 2 (two) times daily with a meal.   furosemide 80 MG tablet Commonly known as:  LASIX Take 2 tablets (160 mg total) by mouth 2 (two) times daily. MAKE SURE TO TAKE AT 8 AM AND 2 PM   insulin aspart 100 UNIT/ML injection Commonly known as:  novoLOG Inject 0-9 Units into the skin 3 (three) times daily with meals. Correction factor Sliding scale CBG 70 - 120: 0 units CBG 121 - 150: 1 unit,  CBG 151 - 200: 2 units,  CBG 201 - 250: 3 units,  CBG 251 - 300: 5 units,  CBG 301 - 350: 7 units,  CBG 351 - 400: 9 units   CBG > 400: 9 units and notify your  MD   insulin NPH Human 100 UNIT/ML injection Commonly known as:  HUMULIN N,NOVOLIN N Inject 20 Units into the skin 2 (two) times daily before a meal.   metolazone 2.5 MG tablet Commonly known as:  ZAROXOLYN Every other Wednesday   metoprolol tartrate 50 MG tablet Commonly known as:  LOPRESSOR Take 1 tablet (50 mg total) by mouth 2 (two) times daily.   MULTIVITAMIN/IRON PO Take 1 tablet by mouth daily.   omeprazole 20 MG capsule Commonly known as:  PRILOSEC Take 20 mg by mouth daily.   OXYGEN Inhale 2 L into the lungs continuous.   potassium chloride SA 20 MEQ tablet Commonly known as:  K-DUR,KLOR-CON Take 20 mEq by mouth 3 (three) times daily. MAKE SURE TO TAKE EXTRA 20 MEQ ON METOLAZONE DAYS   tiotropium 18 MCG inhalation capsule Commonly known as:  SPIRIVA Place 1 capsule (18 mcg total) into inhaler and inhale daily.   Tiotropium Bromide-Olodaterol 2.5-2.5 MCG/ACT Aers Commonly known as:  STIOLTO RESPIMAT Inhale 2 puffs daily into the lungs.   vitamin B-12 500 MCG tablet Commonly known as:  CYANOCOBALAMIN Take 500 mcg by mouth daily.   Vitamin C 500 MG Caps Take 500 mg by mouth daily.   Vitamin D 2000 units tablet Take 2,000 Units by mouth daily.       Allergies:  Allergies  Allergen Reactions  . Ace Inhibitors Cough  . Clindamycin Rash    Past Medical History, Surgical history, Social history, and Family History were reviewed and updated.  Review of Systems: All other 10 point review of systems is negative.   Physical Exam:  weight is 301 lb (136.5 kg) (abnormal). Her oral temperature is 97.4 F (36.3 C) (abnormal). Her blood pressure is 100/41 (abnormal) and her pulse is 67. Her respiration is 20 and oxygen saturation is 90%.   Wt Readings from Last 3 Encounters:  10/16/17 (!) 301 lb (136.5 kg)  10/09/17 297 lb (134.7 kg)  09/11/17 300 lb (136.1 kg)    Ocular: Sclerae unicteric, pupils equal, round and reactive to light Ear-nose-throat:  Oropharynx clear, dentition fair Lymphatic: No cervical, supraclavicular or axillary adenopathy Lungs no rales or rhonchi, good excursion bilaterally Heart regular rate and rhythm, no murmur appreciated Abd soft, nontender, positive bowel sounds, no liver or spleen tip palpated on exam, no fluid wave  MSK no focal spinal tenderness, no joint edema Neuro: non-focal, well-oriented, appropriate affect Breasts: Deferred   Lab Results  Component Value Date   WBC 8.9 10/16/2017   HGB 10.0 (L) 10/16/2017   HCT 32.3 (L) 10/16/2017   MCV 100 10/16/2017   PLT 217 10/16/2017   Lab Results  Component Value Date   FERRITIN 392 (H) 09/11/2017   IRON 41 09/11/2017   TIBC 250 09/11/2017   UIBC 209 09/11/2017   IRONPCTSAT 16 (L) 09/11/2017   Lab Results  Component Value Date   RETICCTPCT 2.3 09/21/2015   RBC 3.24 (L) 10/16/2017   RETICCTABS 89.0 09/21/2015   No results found for: KPAFRELGTCHN, LAMBDASER, KAPLAMBRATIO No results found for: IGGSERUM, IGA, IGMSERUM No results found for: Odetta Pink, SPEI   Chemistry      Component Value Date/Time   NA 141 10/16/2017 1106   NA 140 07/31/2017 1028   K 3.8 10/16/2017 1106   K 4.2 07/31/2017 1028   CL 92 (L) 10/16/2017 1106   CO2 31 10/16/2017 1106   CO2 35 (H) 07/31/2017 1028   BUN 40 (H) 10/16/2017 1106   BUN 42.7 (H) 07/31/2017 1028   CREATININE 1.7 (H) 10/16/2017 1106   CREATININE 1.5 (H) 07/31/2017 1028      Component Value Date/Time   CALCIUM 9.5 10/16/2017 1106   CALCIUM 10.1 07/31/2017 1028   ALKPHOS 68 10/16/2017 1106   ALKPHOS 69 07/31/2017 1028   AST 18 10/16/2017 1106   AST 14 07/31/2017 1028   ALT 15 10/16/2017 1106   ALT 14 07/31/2017 1028   BILITOT 0.60 10/16/2017 1106   BILITOT 0.54 07/31/2017 1028      Impression and Plan: Ms. Grand is a very pleasant 71 yo caucasian female with both iron deficiency anemia and anemia of chronic renal insufficiency.  She  received IV iron and Aranesp in October. Hgb today is 10.0.  We will give her Aranesp today. We will see what her iron studies show and bring her back in next week for an infusion if needed.  I did refill her Allopurinol 100 mg PO daily for 2 weeks until she can get in to see hr new PCP.  We will plan to see her back again in another month for follow-up.  She will contact our office with any questions or concerns. We can certainly see her sooner if need be.     Eliezer Bottom, NP 11/20/201811:44 AM

## 2017-10-16 NOTE — Patient Instructions (Signed)

## 2017-10-17 LAB — RETICULOCYTES: RETICULOCYTE COUNT: 2.2 % (ref 0.6–2.6)

## 2017-10-22 ENCOUNTER — Encounter (HOSPITAL_COMMUNITY): Payer: Medicare Other | Admitting: Cardiology

## 2017-10-26 ENCOUNTER — Telehealth: Payer: Self-pay | Admitting: Emergency Medicine

## 2017-10-26 MED ORDER — TIOTROPIUM BROMIDE-OLODATEROL 2.5-2.5 MCG/ACT IN AERS: 2.0000 | INHALATION_SPRAY | Freq: Every day | RESPIRATORY_TRACT | 6 refills | Status: DC

## 2017-10-26 NOTE — Telephone Encounter (Signed)
Pt is requesting that Rx for Stiolto to be faxed to [facility] at [phone]. Per RB's last OV pt was instructed to continue Stiolto.  Rx has been signed by RB and faxed to provided number. Nothing further needed.

## 2017-11-13 ENCOUNTER — Encounter (HOSPITAL_COMMUNITY): Payer: Self-pay

## 2017-11-13 ENCOUNTER — Ambulatory Visit (HOSPITAL_COMMUNITY)
Admission: RE | Admit: 2017-11-13 | Discharge: 2017-11-13 | Disposition: A | Discharge status: Home / Self Care | Payer: Medicare Other | Source: Ambulatory Visit | Attending: Cardiology | Admitting: Cardiology

## 2017-11-13 VITALS — BP 113/46 | HR 65 | Wt 295.0 lb

## 2017-11-13 DIAGNOSIS — Z87891 Personal history of nicotine dependence: Secondary | ICD-10-CM | POA: Diagnosis not present

## 2017-11-13 DIAGNOSIS — N183 Chronic kidney disease, stage 3 (moderate): Secondary | ICD-10-CM | POA: Diagnosis not present

## 2017-11-13 DIAGNOSIS — Z794 Long term (current) use of insulin: Secondary | ICD-10-CM | POA: Insufficient documentation

## 2017-11-13 DIAGNOSIS — I480 Paroxysmal atrial fibrillation: Secondary | ICD-10-CM | POA: Diagnosis not present

## 2017-11-13 DIAGNOSIS — Z807 Family history of other malignant neoplasms of lymphoid, hematopoietic and related tissues: Secondary | ICD-10-CM | POA: Insufficient documentation

## 2017-11-13 DIAGNOSIS — Z9889 Other specified postprocedural states: Secondary | ICD-10-CM | POA: Insufficient documentation

## 2017-11-13 DIAGNOSIS — D631 Anemia in chronic kidney disease: Secondary | ICD-10-CM | POA: Insufficient documentation

## 2017-11-13 DIAGNOSIS — Z7902 Long term (current) use of antithrombotics/antiplatelets: Secondary | ICD-10-CM | POA: Insufficient documentation

## 2017-11-13 DIAGNOSIS — Z9981 Dependence on supplemental oxygen: Secondary | ICD-10-CM | POA: Insufficient documentation

## 2017-11-13 DIAGNOSIS — I5032 Chronic diastolic (congestive) heart failure: Secondary | ICD-10-CM

## 2017-11-13 DIAGNOSIS — Z79899 Other long term (current) drug therapy: Secondary | ICD-10-CM | POA: Diagnosis not present

## 2017-11-13 DIAGNOSIS — E1122 Type 2 diabetes mellitus with diabetic chronic kidney disease: Secondary | ICD-10-CM | POA: Insufficient documentation

## 2017-11-13 DIAGNOSIS — Z6841 Body Mass Index (BMI) 40.0 and over, adult: Secondary | ICD-10-CM | POA: Diagnosis not present

## 2017-11-13 DIAGNOSIS — Z803 Family history of malignant neoplasm of breast: Secondary | ICD-10-CM | POA: Insufficient documentation

## 2017-11-13 DIAGNOSIS — I350 Nonrheumatic aortic (valve) stenosis: Secondary | ICD-10-CM | POA: Insufficient documentation

## 2017-11-13 DIAGNOSIS — J449 Chronic obstructive pulmonary disease, unspecified: Secondary | ICD-10-CM | POA: Diagnosis not present

## 2017-11-13 DIAGNOSIS — E785 Hyperlipidemia, unspecified: Secondary | ICD-10-CM

## 2017-11-13 DIAGNOSIS — Z8249 Family history of ischemic heart disease and other diseases of the circulatory system: Secondary | ICD-10-CM | POA: Diagnosis not present

## 2017-11-13 DIAGNOSIS — I130 Hypertensive heart and chronic kidney disease with heart failure and stage 1 through stage 4 chronic kidney disease, or unspecified chronic kidney disease: Secondary | ICD-10-CM | POA: Insufficient documentation

## 2017-11-13 LAB — COMPREHENSIVE METABOLIC PANEL
ALT: 14 U/L (ref 14–54)
AST: 20 U/L (ref 15–41)
Albumin: 3.6 g/dL (ref 3.5–5.0)
Alkaline Phosphatase: 63 U/L (ref 38–126)
Anion gap: 9 (ref 5–15)
BUN: 32 mg/dL — ABNORMAL HIGH (ref 6–20)
CHLORIDE: 95 mmol/L — AB (ref 101–111)
CO2: 32 mmol/L (ref 22–32)
CREATININE: 1.52 mg/dL — AB (ref 0.44–1.00)
Calcium: 9.3 mg/dL (ref 8.9–10.3)
GFR calc non Af Amer: 33 mL/min — ABNORMAL LOW (ref >60)
GFR, EST AFRICAN AMERICAN: 38 mL/min — AB (ref >60)
Glucose, Bld: 88 mg/dL (ref 65–99)
POTASSIUM: 4.1 mmol/L (ref 3.5–5.1)
SODIUM: 136 mmol/L (ref 135–145)
Total Bilirubin: 0.8 mg/dL (ref 0.3–1.2)
Total Protein: 7.3 g/dL (ref 6.5–8.1)

## 2017-11-13 LAB — LIPID PANEL
CHOL/HDL RATIO: 2.5 ratio
CHOLESTEROL: 127 mg/dL (ref 0–200)
HDL: 50 mg/dL (ref >40)
LDL Cholesterol: 58 mg/dL (ref 0–99)
TRIGLYCERIDES: 94 mg/dL (ref <150)
VLDL: 19 mg/dL (ref 0–40)

## 2017-11-13 LAB — TSH: TSH: 4.307 u[IU]/mL (ref 0.350–4.500)

## 2017-11-13 NOTE — Patient Instructions (Addendum)
Increase oxygen to 5 Liter while walking  Labs drawn today (if we do not call you, then your lab work was stable)   Your physician has requested that you have an echocardiogram. Echocardiography is a painless test that uses sound waves to create images of your heart. It provides your doctor with information about the size and shape of your heart and how well your heart's chambers and valves are working. This procedure takes approximately one hour. There are no restrictions for this procedure.  Your physician recommends that you schedule a follow-up appointment in: May, 2019 with Dr. Aundra Dubin  an a echocardiogram

## 2017-11-13 NOTE — Progress Notes (Signed)
Patient ID: Paige Hardin, female   DOB: Jun 29, 1946, 71 y.o.   MRN: [id number]     Advanced Heart Failure Clinic Note   Pulmonologist: Dr. Lamonte Sakai  PCP: Tollie Pizza HF Cardiologist: Aundra Dubin   71 yo with history of morbid obesity, COPD on home oxygen, paroxysmal atrial fibrillation, and chronic diastolic CHF presents for followup. She was admitted for several weeks from 3/15 to 4/15 with atrial fibrillation/RVR, acute on chronic diastolic CHF, and COPD exacerbation.  She ended up being cardioverted in the hospital.  She was diuresed with IV Lasix.  After the prolonged hospitalization, she was sent to [facility].  She was again admitted at the end of 4/15 and hospitalized into 5/15 with acute on chronic diastolic CHF. She was intubated this time and went back into atrial fibrillation with RVR.  She was cardioverted back to NSR and is maintained on amiodarone.  She was again diuresed and discharged to SNF [facility]).     Admitted 10/17/16 through 10/20/16 and treated for UTI and mild volume overload. Treated with IV antibiotics and transitioned to keflex. Diuresed with IV lasix and transitioned to lasix 160 mg twice a day. ECHO 09/2016 with EF 45-50% and Grade II DD. Discharge weight was 303 pounds.   She returns today for followup of CHF. Breathing is about the same as in the past.  She uses her walker.  She gets short of breath walking to the cafeteria in her assisted living.  Oxygen saturation drops to the 80s with ambulation but is stable at rest.  She has been sleeping in a recliner long-term. Weight is down 5 lbs.  No chest pain.   ECG (personally reviewed): NSR, low voltage  Labs (4/15): K 4.4, creatinine 1.4 Labs (5/15): creatinine 1.3, hemoglobin 8.5 Labs (04/30/14 ): K 3.6, creatinine 1.6 Labs (05/05/14) : K 4.4, creatinine 1.42, TSH 3.69, pro-BNP 999, AST 15, ALT 14 Labs (06/18/14): K 4.1, creatinine 1.55, AST 19, ALT 19 Labs (4/16): K 4.3, creatinine 1.33, HCT 36 Labs (5/16): K 4.2, creatinine  2.09, TSH normal, LFTs normal Labs (8/16): K 4.2, creatinine 1.35, TSH normal Labs (10/20/2016): K 4.6 Creatinine 1.65.  Labs (5/18): K 4.1, creatinine 1.7, LFTs normal, hgb 11, TSH normal, BNP 214 Labs (11/18): K 3.8, creatinine 1.7, hgb 10  1. Chronic low back pain.  2. COPD: On home oxygen. Prior smoker.  3. Type 2 diabetes.  4. Paroxysmal atrial fibrillation:  DCCV in 2010 and again in 4/15.  She is on warfarin and is on amiodarone to maintain NSR.  5. Hyperlipidemia.  6. Diastolic congestive heart failure: Echo (2/15) with EF 60%, mild MR, PA systolic pressure 53 mmHg. Echo (5/16) with mild LVH, EF 55-60%, mild MR, moderate biatrial enlargement.   - ECHO 09/2016: EF 45-50%. Mild/mod aortic stenosis Grade II DD.  - Echo 5/18: EF 50-55%, normal RV size and systolic function, PASP 51 mmHg, moderate aortic stenosis with mean gradient 27 mmHg, moderate MR.  7. Hypertension.  8. Low back pain 9. Obesity 10. CKD 11. Anemia: Fe deficiency 12. RLL nodule  13. ACEI cough 14. Suspected gout 15. Aortic stenosis: Moderate on 5/18 echo.  16. Negative sleep study 17. Pulmonary nodules: Followed by Dr. Lamonte Sakai  SOCIAL HISTORY: Prior smoker.  Patient is currently in SNF [facility]).  FAMILY HISTORY: Non-Hodgkin lymphoma and breast cancer. No early-onset  coronary artery disease.   REVIEW OF SYSTEMS: Negative except as noted in the history of present  Illness.  Current Outpatient Medications  Medication Sig  Dispense Refill  . acetaminophen (TYLENOL) 325 MG tablet Take 325 mg by mouth daily as needed for mild pain.     Marland Kitchen albuterol (PROVENTIL HFA;VENTOLIN HFA) 108 (90 Base) MCG/ACT inhaler Inhale 2 puffs into the lungs every 6 (six) hours as needed for wheezing or shortness of breath.    Marland Kitchen albuterol (PROVENTIL) (2.5 MG/3ML) 0.083% nebulizer solution Take 2.5 mg by nebulization every 6 (six) hours as needed for wheezing or shortness of breath.    . allopurinol (ZYLOPRIM) 100 MG tablet Take 1  tablet (100 mg total) by mouth daily. 14 tablet 0  . amiodarone (PACERONE) 200 MG tablet Take 1 tablet (200 mg total) by mouth daily.    Marland Kitchen apixaban (ELIQUIS) 5 MG TABS tablet Take 1 tablet (5 mg total) by mouth 2 (two) times daily. 60 tablet   . Ascorbic Acid (VITAMIN C) 500 MG CAPS Take 500 mg by mouth daily.     . calcium-vitamin D (OSCAL WITH D) 500-200 MG-UNIT per tablet Take 1 tablet by mouth daily with breakfast.    . Cholecalciferol (VITAMIN D) 2000 UNITS tablet Take 2,000 Units by mouth daily.    Marland Kitchen diltiazem (CARDIZEM CD) 180 MG 24 hr capsule Take 1 capsule (180 mg total) by mouth daily. 30 capsule 3  . ezetimibe-simvastatin (VYTORIN) 10-40 MG per tablet Take 1 tablet by mouth at bedtime.     . ferrous sulfate 325 (65 FE) MG tablet Take 325 mg by mouth 2 (two) times daily with a meal.     . furosemide (LASIX) 80 MG tablet Take 2 tablets (160 mg total) by mouth 2 (two) times daily. MAKE SURE TO TAKE AT 8 AM AND 2 PM    . guaiFENesin (DIABETIC TUSSIN EX) 100 MG/5ML liquid Take 200 mg by mouth 3 (three) times daily as needed for cough.    . insulin aspart (NOVOLOG) 100 UNIT/ML injection Inject 0-9 Units into the skin 3 (three) times daily with meals. Correction factor Sliding scale CBG 70 - 120: 0 units CBG 121 - 150: 1 unit,  CBG 151 - 200: 2 units,  CBG 201 - 250: 3 units,  CBG 251 - 300: 5 units,  CBG 301 - 350: 7 units,  CBG 351 - 400: 9 units   CBG > 400: 9 units and notify your MD 10 mL 11  . insulin NPH Human (HUMULIN N,NOVOLIN N) 100 UNIT/ML injection Inject 20 Units into the skin 2 (two) times daily before a meal.     . metolazone (ZAROXOLYN) 2.5 MG tablet Every other Wednesday 15 tablet 3  . metoprolol (LOPRESSOR) 50 MG tablet Take 1 tablet (50 mg total) by mouth 2 (two) times daily. 60 tablet 3  . Multiple Vitamins-Iron (MULTIVITAMIN/IRON PO) Take 1 tablet by mouth daily.    Marland Kitchen omeprazole (PRILOSEC) 20 MG capsule Take 20 mg by mouth daily.    . OXYGEN Inhale 2 L into the lungs  continuous.    . potassium chloride SA (K-DUR,KLOR-CON) 20 MEQ tablet Take 20 mEq by mouth 3 (three) times daily. MAKE SURE TO TAKE EXTRA 20 MEQ ON METOLAZONE DAYS    . Tiotropium Bromide-Olodaterol (STIOLTO RESPIMAT) 2.5-2.5 MCG/ACT AERS Inhale 2 puffs into the lungs daily. 2 Inhaler 6  . vitamin B-12 (CYANOCOBALAMIN) 500 MCG tablet Take 500 mcg by mouth daily.     No current facility-administered medications for this encounter.     Vitals:   11/13/17 1011  BP: (!) 113/46  Pulse: 65  SpO2: 94%  Weight: 295 lb (133.8 kg)   Wt Readings from Last 3 Encounters:  11/13/17 295 lb (133.8 kg)  10/16/17 (!) 301 lb (136.5 kg)  10/09/17 297 lb (134.7 kg)   General: NAD, obese Neck: No JVD, no thyromegaly or thyroid nodule.  Lungs: Clear to auscultation bilaterally with normal respiratory effort. CV: Nondisplaced PMI.  Heart regular S1/S2, no S3/S4, 1/6 SEM RUSB.  1+ chronic edema to knees.  No carotid bruit.  Normal pedal pulses.  Abdomen: Soft, nontender, no hepatosplenomegaly, no distention.  Skin: Intact without lesions or rashes.  Neurologic: Alert and oriented x 3.  Psych: Normal affect. Extremities: No clubbing or cyanosis.  HEENT: Normal.   Assessment/Plan:  1. Atrial fibrillation: Paroxysmal.  NSR today. - Continue amiodarone 200 mg daily. LFTs/TSH today.  Will need regular eye exam.  - Continue Eliquis 5 mg bid.  2. Chronic diastolic CHF: Last echo in 5/18 with EF 50-55% with moderate AS and moderate MR. She does not appear particularly volume overloaded today and weight is down. Stable NYHA class III symptoms. - We have discussed CardioMems to help manage her CHF.  She does not want to do this.  - Continue lasix 160 mg BID + metolazone 2.5 mg every other week.  BMET today.   3. CKD stage III: BMET today.   4. COPD: Increase oxygen with exertion to 5 L from 4 L.    5. Aortic Stenosis: Moderate on 5/18 echo.  Repeat echo in 5/19.   6. Morbid Obesity: Continue weight loss  efforts.    Followup in 3 months.    Loralie Champagne, MD  11/13/2017

## 2017-11-14 DIAGNOSIS — R2681 Unsteadiness on feet: Secondary | ICD-10-CM | POA: Diagnosis not present

## 2017-11-14 DIAGNOSIS — J441 Chronic obstructive pulmonary disease with (acute) exacerbation: Secondary | ICD-10-CM | POA: Diagnosis not present

## 2017-11-14 DIAGNOSIS — M6281 Muscle weakness (generalized): Secondary | ICD-10-CM | POA: Diagnosis not present

## 2017-11-15 ENCOUNTER — Encounter (HOSPITAL_COMMUNITY): Payer: Self-pay

## 2017-11-15 DIAGNOSIS — J441 Chronic obstructive pulmonary disease with (acute) exacerbation: Secondary | ICD-10-CM | POA: Diagnosis not present

## 2017-11-15 DIAGNOSIS — M6281 Muscle weakness (generalized): Secondary | ICD-10-CM | POA: Diagnosis not present

## 2017-11-15 DIAGNOSIS — R2681 Unsteadiness on feet: Secondary | ICD-10-CM | POA: Diagnosis not present

## 2017-11-19 DIAGNOSIS — R2681 Unsteadiness on feet: Secondary | ICD-10-CM | POA: Diagnosis not present

## 2017-11-19 DIAGNOSIS — M6281 Muscle weakness (generalized): Secondary | ICD-10-CM | POA: Diagnosis not present

## 2017-11-19 DIAGNOSIS — J441 Chronic obstructive pulmonary disease with (acute) exacerbation: Secondary | ICD-10-CM | POA: Diagnosis not present

## 2017-11-21 DIAGNOSIS — M6281 Muscle weakness (generalized): Secondary | ICD-10-CM | POA: Diagnosis not present

## 2017-11-21 DIAGNOSIS — R2681 Unsteadiness on feet: Secondary | ICD-10-CM | POA: Diagnosis not present

## 2017-11-21 DIAGNOSIS — J441 Chronic obstructive pulmonary disease with (acute) exacerbation: Secondary | ICD-10-CM | POA: Diagnosis not present

## 2017-11-22 DIAGNOSIS — M6281 Muscle weakness (generalized): Secondary | ICD-10-CM | POA: Diagnosis not present

## 2017-11-22 DIAGNOSIS — J441 Chronic obstructive pulmonary disease with (acute) exacerbation: Secondary | ICD-10-CM | POA: Diagnosis not present

## 2017-11-22 DIAGNOSIS — R2681 Unsteadiness on feet: Secondary | ICD-10-CM | POA: Diagnosis not present

## 2017-11-23 DIAGNOSIS — M6281 Muscle weakness (generalized): Secondary | ICD-10-CM | POA: Diagnosis not present

## 2017-11-23 DIAGNOSIS — R2681 Unsteadiness on feet: Secondary | ICD-10-CM | POA: Diagnosis not present

## 2017-11-23 DIAGNOSIS — J441 Chronic obstructive pulmonary disease with (acute) exacerbation: Secondary | ICD-10-CM | POA: Diagnosis not present

## 2017-11-26 DIAGNOSIS — R2681 Unsteadiness on feet: Secondary | ICD-10-CM | POA: Diagnosis not present

## 2017-11-26 DIAGNOSIS — J441 Chronic obstructive pulmonary disease with (acute) exacerbation: Secondary | ICD-10-CM | POA: Diagnosis not present

## 2017-11-26 DIAGNOSIS — M6281 Muscle weakness (generalized): Secondary | ICD-10-CM | POA: Diagnosis not present

## 2017-11-28 DIAGNOSIS — R2681 Unsteadiness on feet: Secondary | ICD-10-CM | POA: Diagnosis not present

## 2017-11-28 DIAGNOSIS — M6281 Muscle weakness (generalized): Secondary | ICD-10-CM | POA: Diagnosis not present

## 2017-11-28 DIAGNOSIS — J441 Chronic obstructive pulmonary disease with (acute) exacerbation: Secondary | ICD-10-CM | POA: Diagnosis not present

## 2017-11-29 DIAGNOSIS — R2681 Unsteadiness on feet: Secondary | ICD-10-CM | POA: Diagnosis not present

## 2017-11-29 DIAGNOSIS — M6281 Muscle weakness (generalized): Secondary | ICD-10-CM | POA: Diagnosis not present

## 2017-11-29 DIAGNOSIS — J441 Chronic obstructive pulmonary disease with (acute) exacerbation: Secondary | ICD-10-CM | POA: Diagnosis not present

## 2017-11-30 DIAGNOSIS — J441 Chronic obstructive pulmonary disease with (acute) exacerbation: Secondary | ICD-10-CM | POA: Diagnosis not present

## 2017-11-30 DIAGNOSIS — M6281 Muscle weakness (generalized): Secondary | ICD-10-CM | POA: Diagnosis not present

## 2017-11-30 DIAGNOSIS — R2681 Unsteadiness on feet: Secondary | ICD-10-CM | POA: Diagnosis not present

## 2017-12-03 DIAGNOSIS — R2681 Unsteadiness on feet: Secondary | ICD-10-CM | POA: Diagnosis not present

## 2017-12-03 DIAGNOSIS — J441 Chronic obstructive pulmonary disease with (acute) exacerbation: Secondary | ICD-10-CM | POA: Diagnosis not present

## 2017-12-03 DIAGNOSIS — M6281 Muscle weakness (generalized): Secondary | ICD-10-CM | POA: Diagnosis not present

## 2017-12-04 ENCOUNTER — Inpatient Hospital Stay: Admission: RE | Admit: 2017-12-04 | Payer: Medicare Other | Source: Ambulatory Visit

## 2017-12-05 DIAGNOSIS — M6281 Muscle weakness (generalized): Secondary | ICD-10-CM | POA: Diagnosis not present

## 2017-12-05 DIAGNOSIS — R2681 Unsteadiness on feet: Secondary | ICD-10-CM | POA: Diagnosis not present

## 2017-12-05 DIAGNOSIS — J441 Chronic obstructive pulmonary disease with (acute) exacerbation: Secondary | ICD-10-CM | POA: Diagnosis not present

## 2017-12-06 DIAGNOSIS — R2681 Unsteadiness on feet: Secondary | ICD-10-CM | POA: Diagnosis not present

## 2017-12-06 DIAGNOSIS — J441 Chronic obstructive pulmonary disease with (acute) exacerbation: Secondary | ICD-10-CM | POA: Diagnosis not present

## 2017-12-06 DIAGNOSIS — M6281 Muscle weakness (generalized): Secondary | ICD-10-CM | POA: Diagnosis not present

## 2017-12-07 DIAGNOSIS — R2681 Unsteadiness on feet: Secondary | ICD-10-CM | POA: Diagnosis not present

## 2017-12-07 DIAGNOSIS — J441 Chronic obstructive pulmonary disease with (acute) exacerbation: Secondary | ICD-10-CM | POA: Diagnosis not present

## 2017-12-07 DIAGNOSIS — M6281 Muscle weakness (generalized): Secondary | ICD-10-CM | POA: Diagnosis not present

## 2017-12-11 ENCOUNTER — Other Ambulatory Visit: Payer: Medicare Other

## 2017-12-11 ENCOUNTER — Ambulatory Visit: Payer: Medicare Other

## 2017-12-11 ENCOUNTER — Ambulatory Visit: Payer: Medicare Other | Admitting: Hematology & Oncology

## 2017-12-11 DIAGNOSIS — H25043 Posterior subcapsular polar age-related cataract, bilateral: Secondary | ICD-10-CM | POA: Diagnosis not present

## 2017-12-11 DIAGNOSIS — H2513 Age-related nuclear cataract, bilateral: Secondary | ICD-10-CM | POA: Diagnosis not present

## 2017-12-11 DIAGNOSIS — E119 Type 2 diabetes mellitus without complications: Secondary | ICD-10-CM | POA: Diagnosis not present

## 2017-12-11 DIAGNOSIS — H25013 Cortical age-related cataract, bilateral: Secondary | ICD-10-CM | POA: Diagnosis not present

## 2017-12-11 DIAGNOSIS — H35032 Hypertensive retinopathy, left eye: Secondary | ICD-10-CM | POA: Diagnosis not present

## 2017-12-12 DIAGNOSIS — J441 Chronic obstructive pulmonary disease with (acute) exacerbation: Secondary | ICD-10-CM | POA: Diagnosis not present

## 2017-12-12 DIAGNOSIS — R2681 Unsteadiness on feet: Secondary | ICD-10-CM | POA: Diagnosis not present

## 2017-12-12 DIAGNOSIS — M6281 Muscle weakness (generalized): Secondary | ICD-10-CM | POA: Diagnosis not present

## 2017-12-13 DIAGNOSIS — R2681 Unsteadiness on feet: Secondary | ICD-10-CM | POA: Diagnosis not present

## 2017-12-13 DIAGNOSIS — J441 Chronic obstructive pulmonary disease with (acute) exacerbation: Secondary | ICD-10-CM | POA: Diagnosis not present

## 2017-12-13 DIAGNOSIS — M6281 Muscle weakness (generalized): Secondary | ICD-10-CM | POA: Diagnosis not present

## 2017-12-17 DIAGNOSIS — R2681 Unsteadiness on feet: Secondary | ICD-10-CM | POA: Diagnosis not present

## 2017-12-17 DIAGNOSIS — M6281 Muscle weakness (generalized): Secondary | ICD-10-CM | POA: Diagnosis not present

## 2017-12-17 DIAGNOSIS — J441 Chronic obstructive pulmonary disease with (acute) exacerbation: Secondary | ICD-10-CM | POA: Diagnosis not present

## 2017-12-18 ENCOUNTER — Ambulatory Visit: Payer: Medicare Other | Admitting: Emergency Medicine

## 2017-12-18 ENCOUNTER — Ambulatory Visit (INDEPENDENT_AMBULATORY_CARE_PROVIDER_SITE_OTHER)
Admission: RE | Admit: 2017-12-18 | Discharge: 2017-12-18 | Disposition: A | Discharge status: Home / Self Care | Payer: Medicare Other | Source: Ambulatory Visit | Attending: Emergency Medicine | Admitting: Emergency Medicine

## 2017-12-18 DIAGNOSIS — R911 Solitary pulmonary nodule: Secondary | ICD-10-CM

## 2017-12-18 DIAGNOSIS — E119 Type 2 diabetes mellitus without complications: Secondary | ICD-10-CM | POA: Diagnosis not present

## 2017-12-18 DIAGNOSIS — M79642 Pain in left hand: Secondary | ICD-10-CM | POA: Diagnosis not present

## 2017-12-18 DIAGNOSIS — I5089 Other heart failure: Secondary | ICD-10-CM | POA: Diagnosis not present

## 2017-12-18 DIAGNOSIS — I4891 Unspecified atrial fibrillation: Secondary | ICD-10-CM | POA: Diagnosis not present

## 2017-12-18 DIAGNOSIS — S6992XA Unspecified injury of left wrist, hand and finger(s), initial encounter: Secondary | ICD-10-CM | POA: Diagnosis not present

## 2017-12-18 DIAGNOSIS — J449 Chronic obstructive pulmonary disease, unspecified: Secondary | ICD-10-CM | POA: Diagnosis not present

## 2017-12-18 DIAGNOSIS — I10 Essential (primary) hypertension: Secondary | ICD-10-CM | POA: Diagnosis not present

## 2017-12-19 DIAGNOSIS — M6281 Muscle weakness (generalized): Secondary | ICD-10-CM | POA: Diagnosis not present

## 2017-12-19 DIAGNOSIS — R2681 Unsteadiness on feet: Secondary | ICD-10-CM | POA: Diagnosis not present

## 2017-12-19 DIAGNOSIS — J441 Chronic obstructive pulmonary disease with (acute) exacerbation: Secondary | ICD-10-CM | POA: Diagnosis not present

## 2017-12-20 DIAGNOSIS — R2681 Unsteadiness on feet: Secondary | ICD-10-CM | POA: Diagnosis not present

## 2017-12-20 DIAGNOSIS — M6281 Muscle weakness (generalized): Secondary | ICD-10-CM | POA: Diagnosis not present

## 2017-12-20 DIAGNOSIS — J441 Chronic obstructive pulmonary disease with (acute) exacerbation: Secondary | ICD-10-CM | POA: Diagnosis not present

## 2017-12-21 ENCOUNTER — Encounter: Payer: Self-pay | Admitting: Hematology & Oncology

## 2017-12-21 ENCOUNTER — Inpatient Hospital Stay: Payer: Medicare Other | Attending: Hematology & Oncology

## 2017-12-21 ENCOUNTER — Inpatient Hospital Stay: Payer: Medicare Other

## 2017-12-21 ENCOUNTER — Other Ambulatory Visit: Payer: Self-pay

## 2017-12-21 ENCOUNTER — Inpatient Hospital Stay (HOSPITAL_BASED_OUTPATIENT_CLINIC_OR_DEPARTMENT_OTHER): Payer: Medicare Other | Admitting: Hematology & Oncology

## 2017-12-21 VITALS — BP 101/36 | HR 59 | Temp 98.4°F | Resp 20 | Wt 302.0 lb

## 2017-12-21 DIAGNOSIS — N189 Chronic kidney disease, unspecified: Secondary | ICD-10-CM | POA: Diagnosis not present

## 2017-12-21 DIAGNOSIS — Z7901 Long term (current) use of anticoagulants: Secondary | ICD-10-CM

## 2017-12-21 DIAGNOSIS — E1122 Type 2 diabetes mellitus with diabetic chronic kidney disease: Secondary | ICD-10-CM

## 2017-12-21 DIAGNOSIS — Z79899 Other long term (current) drug therapy: Secondary | ICD-10-CM | POA: Insufficient documentation

## 2017-12-21 DIAGNOSIS — Z794 Long term (current) use of insulin: Secondary | ICD-10-CM

## 2017-12-21 DIAGNOSIS — D631 Anemia in chronic kidney disease: Secondary | ICD-10-CM

## 2017-12-21 DIAGNOSIS — D500 Iron deficiency anemia secondary to blood loss (chronic): Secondary | ICD-10-CM | POA: Diagnosis not present

## 2017-12-21 DIAGNOSIS — I480 Paroxysmal atrial fibrillation: Secondary | ICD-10-CM | POA: Diagnosis not present

## 2017-12-21 DIAGNOSIS — N183 Chronic kidney disease, stage 3 unspecified: Secondary | ICD-10-CM

## 2017-12-21 DIAGNOSIS — D509 Iron deficiency anemia, unspecified: Secondary | ICD-10-CM | POA: Diagnosis not present

## 2017-12-21 DIAGNOSIS — R042 Hemoptysis: Secondary | ICD-10-CM

## 2017-12-21 DIAGNOSIS — D508 Other iron deficiency anemias: Secondary | ICD-10-CM

## 2017-12-21 DIAGNOSIS — E1165 Type 2 diabetes mellitus with hyperglycemia: Secondary | ICD-10-CM

## 2017-12-21 LAB — CBC WITH DIFFERENTIAL (CANCER CENTER ONLY)
Basophils Absolute: 0 10*3/uL (ref 0.0–0.1)
Basophils Relative: 0 %
EOS PCT: 2 %
Eosinophils Absolute: 0.2 10*3/uL (ref 0.0–0.5)
HEMATOCRIT: 30.2 % — AB (ref 34.8–46.6)
Hemoglobin: 8.9 g/dL — ABNORMAL LOW (ref 11.6–15.9)
LYMPHS ABS: 0.5 10*3/uL — AB (ref 0.9–3.3)
LYMPHS PCT: 5 %
MCH: 29.9 pg (ref 26.0–34.0)
MCHC: 29.5 g/dL — AB (ref 32.0–36.0)
MCV: 101.3 fL — AB (ref 81.0–101.0)
MONO ABS: 0.7 10*3/uL (ref 0.1–0.9)
MONOS PCT: 7 %
NEUTROS ABS: 8.9 10*3/uL — AB (ref 1.5–6.5)
Neutrophils Relative %: 86 %
PLATELETS: 249 10*3/uL (ref 145–400)
RBC: 2.98 MIL/uL — ABNORMAL LOW (ref 3.70–5.32)
RDW: 17.5 % — ABNORMAL HIGH (ref 11.1–15.7)
WBC Count: 10.3 10*3/uL (ref 3.9–10.3)

## 2017-12-21 LAB — IRON AND TIBC
IRON: 49 ug/dL (ref 41–142)
SATURATION RATIOS: 19 % — AB (ref 21–57)
TIBC: 260 ug/dL (ref 236–444)
UIBC: 211 ug/dL

## 2017-12-21 LAB — CMP (CANCER CENTER ONLY)
ALT: 18 U/L (ref 0–55)
ANION GAP: 15 (ref 5–15)
AST: 20 U/L (ref 5–34)
Albumin: 3.1 g/dL — ABNORMAL LOW (ref 3.5–5.0)
Alkaline Phosphatase: 65 U/L (ref 26–84)
BILIRUBIN TOTAL: 0.7 mg/dL (ref 0.2–1.2)
BUN: 42 mg/dL — ABNORMAL HIGH (ref 7–22)
CALCIUM: 9.6 mg/dL (ref 8.0–10.3)
CHLORIDE: 94 mmol/L — AB (ref 98–108)
CO2: 34 mmol/L — AB (ref 18–33)
Creatinine: 1.8 mg/dL — ABNORMAL HIGH (ref 0.60–1.10)
GLUCOSE: 142 mg/dL — AB (ref 73–118)
POTASSIUM: 3.8 mmol/L (ref 3.5–5.1)
Sodium: 143 mmol/L (ref 128–145)
Total Protein: 7.3 g/dL (ref 6.4–8.1)

## 2017-12-21 LAB — RETICULOCYTES
RBC.: 3.02 MIL/uL — ABNORMAL LOW (ref 3.70–5.45)
RETIC CT PCT: 4.5 % — AB (ref 0.7–2.1)
Retic Count, Absolute: 135.9 10*3/uL — ABNORMAL HIGH (ref 33.7–90.7)

## 2017-12-21 LAB — FERRITIN: Ferritin: 247 ng/mL (ref 9–269)

## 2017-12-21 MED ORDER — DARBEPOETIN ALFA 300 MCG/0.6ML IJ SOSY
300.0000 ug | PREFILLED_SYRINGE | Freq: Once | INTRAMUSCULAR | Status: AC
  Administered 2017-12-21: 300 ug via SUBCUTANEOUS

## 2017-12-21 NOTE — Patient Instructions (Signed)

## 2017-12-21 NOTE — Progress Notes (Signed)
Hematology and Oncology Follow Up Visit  Paige Hardin [id number] 1946-06-29 72 y.o. 12/21/2017   Principle Diagnosis:  Anemia secondary to iron deficiency  Anemia secondary to renal insufficiency  Chronic anticoagulation for paroxysmal atrial fibrillation  Current Therapy:   IV iron as indicated - last received in October 2018 Aranesp 300 mcg subcutaneous as needed for hemoglobin less than 11  ELIQUIS - life long   Interim History:  [name] is here today with her sister for follow-up.  She still is at [facility].  She is doing okay there.  She got through the holidays without any problems.  She is on supplemental oxygen.  When we last saw her back in November, her iron studies showed a ferritin of 430 with iron saturation of 16%.  She did get a dose of IV iron.  She has had no fever.  She has had no bleeding.  There is been no diarrhea.  She has had no leg swelling.  She has had no nausea or vomiting.  Overall, her performance status is ECOG 2.    There is really been no change in her medications.  Patient still has diabetes.  She uses insulin for this.  Medications:  Allergies as of 12/21/2017      Reactions   Ace Inhibitors Cough   Clindamycin Rash      Medication List        Accurate as of 12/21/17  9:51 AM. Always use your most recent med list.          acetaminophen 325 MG tablet Commonly known as:  TYLENOL Take 325 mg by mouth daily as needed for mild pain.   albuterol (2.5 MG/3ML) 0.083% nebulizer solution Commonly known as:  PROVENTIL Take 2.5 mg by nebulization every 6 (six) hours as needed for wheezing or shortness of breath.   albuterol 108 (90 Base) MCG/ACT inhaler Commonly known as:  PROVENTIL HFA;VENTOLIN HFA Inhale 2 puffs into the lungs every 6 (six) hours as needed for wheezing or shortness of breath.   allopurinol 100 MG tablet Commonly known as:  ZYLOPRIM Take 1 tablet (100 mg total) by mouth daily.   amiodarone 200 MG  tablet Commonly known as:  PACERONE Take 1 tablet (200 mg total) by mouth daily.   apixaban 5 MG Tabs tablet Commonly known as:  ELIQUIS Take 1 tablet (5 mg total) by mouth 2 (two) times daily.   calcium-vitamin D 500-200 MG-UNIT tablet Commonly known as:  OSCAL WITH D Take 1 tablet by mouth daily with breakfast.   DIABETIC TUSSIN EX 100 MG/5ML liquid Generic drug:  guaiFENesin Take 200 mg by mouth 3 (three) times daily as needed for cough.   diltiazem 180 MG 24 hr capsule Commonly known as:  CARDIZEM CD Take 1 capsule (180 mg total) by mouth daily.   ezetimibe-simvastatin 10-40 MG tablet Commonly known as:  VYTORIN Take 1 tablet by mouth at bedtime.   ferrous sulfate 325 (65 FE) MG tablet Take 325 mg by mouth 2 (two) times daily with a meal.   furosemide 80 MG tablet Commonly known as:  LASIX Take 2 tablets (160 mg total) by mouth 2 (two) times daily. MAKE SURE TO TAKE AT 8 AM AND 2 PM   insulin aspart 100 UNIT/ML injection Commonly known as:  novoLOG Inject 0-9 Units into the skin 3 (three) times daily with meals. Correction factor Sliding scale CBG 70 - 120: 0 units CBG 121 - 150: 1 unit,  CBG 151 -  200: 2 units,  CBG 201 - 250: 3 units,  CBG 251 - 300: 5 units,  CBG 301 - 350: 7 units,  CBG 351 - 400: 9 units   CBG > 400: 9 units and notify your MD   insulin NPH Human 100 UNIT/ML injection Commonly known as:  HUMULIN N,NOVOLIN N Inject 20 Units into the skin 2 (two) times daily before a meal.   metolazone 2.5 MG tablet Commonly known as:  ZAROXOLYN Every other Wednesday   metoprolol tartrate 50 MG tablet Commonly known as:  LOPRESSOR Take 1 tablet (50 mg total) by mouth 2 (two) times daily.   MULTIVITAMIN/IRON PO Take 1 tablet by mouth daily.   omeprazole 20 MG capsule Commonly known as:  PRILOSEC Take 20 mg by mouth daily.   OXYGEN Inhale 2 L into the lungs continuous.   potassium chloride SA 20 MEQ tablet Commonly known as:  K-DUR,KLOR-CON Take 20 mEq  by mouth 3 (three) times daily. MAKE SURE TO TAKE EXTRA 20 MEQ ON METOLAZONE DAYS   Tiotropium Bromide-Olodaterol 2.5-2.5 MCG/ACT Aers Commonly known as:  STIOLTO RESPIMAT Inhale 2 puffs into the lungs daily.   vitamin B-12 500 MCG tablet Commonly known as:  CYANOCOBALAMIN Take 500 mcg by mouth daily.   Vitamin C 500 MG Caps Take 500 mg by mouth daily.   Vitamin D 2000 units tablet Take 2,000 Units by mouth daily.       Allergies:  Allergies  Allergen Reactions  . Ace Inhibitors Cough  . Clindamycin Rash    Past Medical History, Surgical history, Social history, and Family History were reviewed and updated.  Review of Systems: Review of Systems  Constitutional: Negative.   HENT: Negative.   Eyes: Negative.   Respiratory: Negative.   Cardiovascular: Negative.   Gastrointestinal: Negative.   Genitourinary: Negative.   Musculoskeletal: Negative.   Skin: Negative.   Neurological: Negative.   Endo/Heme/Allergies: Negative.   Psychiatric/Behavioral: Negative.       Physical Exam:  vitals were not taken for this visit.   Wt Readings from Last 3 Encounters:  11/13/17 295 lb (133.8 kg)  10/16/17 (!) 301 lb (136.5 kg)  10/09/17 297 lb (134.7 kg)    Physical Exam  Constitutional: She is oriented to person, place, and time.  HENT:  Head: Normocephalic and atraumatic.  Mouth/Throat: Oropharynx is clear and moist.  Eyes: EOM are normal. Pupils are equal, round, and reactive to light.  Neck: Normal range of motion.  Cardiovascular: Normal rate, regular rhythm and normal heart sounds.  Irregular rate and rhythm consistent with her atrial fibrillation  Pulmonary/Chest: Effort normal and breath sounds normal.  Abdominal: Soft. Bowel sounds are normal.  Musculoskeletal: Normal range of motion. She exhibits no edema, tenderness or deformity.  Lymphadenopathy:    She has no cervical adenopathy.  Neurological: She is alert and oriented to person, place, and time.  Skin:  Skin is warm and dry. No rash noted. No erythema.  Psychiatric: She has a normal mood and affect. Her behavior is normal. Judgment and thought content normal.  Vitals reviewed.   Lab Results  Component Value Date   WBC 10.3 12/21/2017   HGB 10.0 (L) 10/16/2017   HCT 30.2 (L) 12/21/2017   MCV 101.3 (H) 12/21/2017   PLT 249 12/21/2017   Lab Results  Component Value Date   FERRITIN 430 (H) 10/16/2017   IRON 38 (L) 10/16/2017   TIBC 241 10/16/2017   UIBC 203 10/16/2017   IRONPCTSAT 16 (L)  10/16/2017   Lab Results  Component Value Date   RETICCTPCT 2.3 09/21/2015   RBC 2.98 (L) 12/21/2017   RETICCTABS 89.0 09/21/2015   No results found for: KPAFRELGTCHN, LAMBDASER, KAPLAMBRATIO No results found for: IGGSERUM, IGA, IGMSERUM No results found for: Odetta Pink, SPEI   Chemistry      Component Value Date/Time   NA 136 11/13/2017 1059   NA 141 10/16/2017 1106   NA 140 07/31/2017 1028   K 4.1 11/13/2017 1059   K 3.8 10/16/2017 1106   K 4.2 07/31/2017 1028   CL 95 (L) 11/13/2017 1059   CL 92 (L) 10/16/2017 1106   CO2 32 11/13/2017 1059   CO2 31 10/16/2017 1106   CO2 35 (H) 07/31/2017 1028   BUN 32 (H) 11/13/2017 1059   BUN 40 (H) 10/16/2017 1106   BUN 42.7 (H) 07/31/2017 1028   CREATININE 1.52 (H) 11/13/2017 1059   CREATININE 1.7 (H) 10/16/2017 1106   CREATININE 1.5 (H) 07/31/2017 1028      Component Value Date/Time   CALCIUM 9.3 11/13/2017 1059   CALCIUM 9.5 10/16/2017 1106   CALCIUM 10.1 07/31/2017 1028   ALKPHOS 63 11/13/2017 1059   ALKPHOS 68 10/16/2017 1106   ALKPHOS 69 07/31/2017 1028   AST 20 11/13/2017 1059   AST 18 10/16/2017 1106   AST 14 07/31/2017 1028   ALT 14 11/13/2017 1059   ALT 15 10/16/2017 1106   ALT 14 07/31/2017 1028   BILITOT 0.8 11/13/2017 1059   BILITOT 0.60 10/16/2017 1106   BILITOT 0.54 07/31/2017 1028      Impression and Plan: Ms. Vogt is a very pleasant 72 yo caucasian female  with both iron deficiency anemia and anemia of chronic renal insufficiency.   We will go ahead and give her Aranesp today.  We have see what her iron studies are.  We may want to change her iron over to [facility] so we can give her a higher dose of iron which might be better for her since it is hard for her to come to our office.  We will plan to get her back here in another month.  I want to make sure that we are aggressive and try to keep her hemoglobin above 10 or 11.     Volanda Napoleon, MD 1/25/20199:51 AM

## 2017-12-24 DIAGNOSIS — R2681 Unsteadiness on feet: Secondary | ICD-10-CM | POA: Diagnosis not present

## 2017-12-24 DIAGNOSIS — M6281 Muscle weakness (generalized): Secondary | ICD-10-CM | POA: Diagnosis not present

## 2017-12-24 DIAGNOSIS — J441 Chronic obstructive pulmonary disease with (acute) exacerbation: Secondary | ICD-10-CM | POA: Diagnosis not present

## 2017-12-26 DIAGNOSIS — R2681 Unsteadiness on feet: Secondary | ICD-10-CM | POA: Diagnosis not present

## 2017-12-26 DIAGNOSIS — M6281 Muscle weakness (generalized): Secondary | ICD-10-CM | POA: Diagnosis not present

## 2017-12-26 DIAGNOSIS — J441 Chronic obstructive pulmonary disease with (acute) exacerbation: Secondary | ICD-10-CM | POA: Diagnosis not present

## 2017-12-27 DIAGNOSIS — H25013 Cortical age-related cataract, bilateral: Secondary | ICD-10-CM | POA: Diagnosis not present

## 2017-12-27 DIAGNOSIS — H269 Unspecified cataract: Secondary | ICD-10-CM | POA: Diagnosis not present

## 2017-12-27 DIAGNOSIS — H25043 Posterior subcapsular polar age-related cataract, bilateral: Secondary | ICD-10-CM | POA: Diagnosis not present

## 2017-12-27 DIAGNOSIS — H2511 Age-related nuclear cataract, right eye: Secondary | ICD-10-CM | POA: Diagnosis not present

## 2017-12-27 DIAGNOSIS — H2513 Age-related nuclear cataract, bilateral: Secondary | ICD-10-CM | POA: Diagnosis not present

## 2017-12-28 DIAGNOSIS — R2681 Unsteadiness on feet: Secondary | ICD-10-CM | POA: Diagnosis not present

## 2017-12-28 DIAGNOSIS — J441 Chronic obstructive pulmonary disease with (acute) exacerbation: Secondary | ICD-10-CM | POA: Diagnosis not present

## 2017-12-28 DIAGNOSIS — M6281 Muscle weakness (generalized): Secondary | ICD-10-CM | POA: Diagnosis not present

## 2017-12-31 DIAGNOSIS — M6281 Muscle weakness (generalized): Secondary | ICD-10-CM | POA: Diagnosis not present

## 2017-12-31 DIAGNOSIS — J441 Chronic obstructive pulmonary disease with (acute) exacerbation: Secondary | ICD-10-CM | POA: Diagnosis not present

## 2017-12-31 DIAGNOSIS — R2681 Unsteadiness on feet: Secondary | ICD-10-CM | POA: Diagnosis not present

## 2018-01-01 ENCOUNTER — Encounter: Payer: Self-pay | Admitting: Emergency Medicine

## 2018-01-01 ENCOUNTER — Ambulatory Visit (INDEPENDENT_AMBULATORY_CARE_PROVIDER_SITE_OTHER): Payer: Medicare Other | Admitting: Emergency Medicine

## 2018-01-01 VITALS — BP 136/88 | HR 66 | Ht 65.5 in | Wt 307.0 lb

## 2018-01-01 DIAGNOSIS — R911 Solitary pulmonary nodule: Secondary | ICD-10-CM | POA: Diagnosis not present

## 2018-01-01 DIAGNOSIS — I10 Essential (primary) hypertension: Secondary | ICD-10-CM | POA: Diagnosis not present

## 2018-01-01 DIAGNOSIS — J438 Other emphysema: Secondary | ICD-10-CM | POA: Diagnosis not present

## 2018-01-01 DIAGNOSIS — E559 Vitamin D deficiency, unspecified: Secondary | ICD-10-CM | POA: Diagnosis not present

## 2018-01-01 DIAGNOSIS — R6889 Other general symptoms and signs: Secondary | ICD-10-CM | POA: Diagnosis not present

## 2018-01-01 DIAGNOSIS — E039 Hypothyroidism, unspecified: Secondary | ICD-10-CM | POA: Diagnosis not present

## 2018-01-01 DIAGNOSIS — E538 Deficiency of other specified B group vitamins: Secondary | ICD-10-CM | POA: Diagnosis not present

## 2018-01-01 DIAGNOSIS — E119 Type 2 diabetes mellitus without complications: Secondary | ICD-10-CM | POA: Diagnosis not present

## 2018-01-01 NOTE — Patient Instructions (Signed)
Continue  Stiolto 2 puffs once a day. Keep albuterol available to use 2 puffs if needed for shortness of breath, wheezing, chest tightness. We will decrease your exertional flow rate on your oxygen back to 4 L/min due to the burning side effects of 5 L/min. Your CT scan of the chest shows that your pulmonary nodule is stable in size.  We need to repeat CT scan in 1 year. Follow with Dr Lamonte Sakai in 4 months or sooner if you have any problems.

## 2018-01-01 NOTE — Assessment & Plan Note (Signed)
Pulmonary nodule is stable over the last several CTs, is larger than it was going back to 2015.  Possible hamartoma, possible slow-growing malignancy.  She is a poor candidate for biopsy.  Given the stability we agreed that we will repeat serial CT scan in 1 year.

## 2018-01-01 NOTE — Assessment & Plan Note (Signed)
Appears to be overall clinically stable although note exertional hypoxemia.  Certainly her COPD is a contributor to this.  Suspect that persistent hypoxemia is contributing to right heart dysfunction, volume status.  Unfortunately she is unable to tolerate 5 L/min because it makes her nose burn.  She asked me to go back to 4 L/min and I ordered this today  Continue  Stiolto 2 puffs once a day. Keep albuterol available to use 2 puffs if needed for shortness of breath, wheezing, chest tightness. We will decrease your exertional flow rate on your oxygen back to 4 L/min due to the burning side effects of 5 L/min.. Follow with Dr Lamonte Sakai in 4 months or sooner if you have any problems.

## 2018-01-01 NOTE — Progress Notes (Signed)
Patient ID: Paige Hardin, female   DOB: Nov 12, 1946, 72 y.o.   MRN: [id number]   Subjective:    Patient ID: Paige Hardin, female    DOB: 11-12-1946, 72 y.o.   MRN: [id number]  HPI  [name] 04/03/17 - this is a follow-up visit for patient with a history of COPD and associated chronic hypoxemic respiratory failure. She also has allergic rhinitis with associated cough. We have also followed at a rounded solid nodule in the right lower lobe that had been slowly enlarging on CT scan of the chest. Her most recent CT was 09/12/16. I have reviewed. This shows that the 1 cm right lower lobe nodule had not significantly changed since 02/29/16 but had enlarged compared with more remote priors.  She tells me that she was hospitalized 36/64/40 for UTI, diastolic CHF. She improved but not quite back to her prior baseline. She is having exertional SOB, significant fatigue. She has been turning her o2 up to 4L/min with exertion. Needs to have her concentrator at [facility] turned up to 3L/min. She is using Spiriva, Symbicort, brovana / pulmocort. She has albuterol available, rarely uses. No flares since November 2017.   [name] 10/09/17 --patient has a history of hypertension, diastolic CHF, paroxysmal atrial fibrillation, iron deficiency anemia, renal insufficiency , COPD with associated chronic hypoxemic respiratory failure.  She also has a right lower lobe pulmonary nodule that we have been following with serial imaging. She believes that her breathing may be a bit worse than last visit. She has had some anemia- - was just treated with IV iron. She is having some exertional desaturations even on 4L/min. She has had some intermittent cough, hoarse voice. She uses albuterol rarely.   [name] 01/01/18 --72 year old woman with a history of hypertension, A. fib, diastolic CHF.  Followed her for emphysematous COPD, associated chronic hypoxemic respiratory failure, allergic rhinitis with cough.  She also has a right lower lobe  pulmonary nodule that has been followed with serial imaging.  She had a scan on 12/18/17 that I have reviewed.  This shows only 12.8 mm in greatest dimension.  No new nodules. Little change c/w 02/2016, larger than 2015. She has been fairly well, c/o poor sleep initiation, wakes up frequently. Had a PSG > no OSA 2016. She is noticing more pewripheral edema, notices progressive exercise hypoxemia. She initially tried going up to 5L/minw exertion but she cannot tolerate due to nasal burning. Last visit we changed to [facility] - she prefers it.     Review of Systems  Constitutional: Negative for fever and unexpected weight change.  HENT: Positive for congestion and postnasal drip. Negative for dental problem, ear pain, nosebleeds, rhinorrhea, sinus pressure, sneezing, sore throat and trouble swallowing.   Eyes: Negative for redness and itching.  Respiratory: Positive for cough and shortness of breath. Negative for chest tightness and wheezing.   Cardiovascular: Negative for palpitations and leg swelling.  Gastrointestinal: Negative for nausea and vomiting.  Genitourinary: Negative for dysuria.  Musculoskeletal: Negative for joint swelling.  Skin: Negative for rash.  Neurological: Negative for headaches.  Hematological: Does not bruise/bleed easily.  Psychiatric/Behavioral: Negative for dysphoric mood. The patient is not nervous/anxious.        Objective:   Physical Exam     Vitals:   10/09/17 1016  BP: (!) 118/58  Pulse: (!) 59  SpO2: 92%  Weight: 297 lb (134.7 kg)  Height: 5\' 5"  (1.651 m)   Gen: Pleasant, Chronically ill-appearing, well-nourished, in no distress,  normal  affect  ENT: No lesions,  mouth clear,  oropharynx clear, no postnasal drip  Neck: No JVD, no stridor  Lungs: No use of accessory muscles,clear without rales or rhonchi  Cardiovascular: RRR, heart sounds normal, no murmur or gallops, no peripheral edema  Musculoskeletal: No deformities, no cyanosis or  clubbing  Neuro: alert, non focal  Skin: Warm, no lesions or rashes      Assessment & Plan:  COPD with emphysema  Appears to be overall clinically stable although note exertional hypoxemia.  Certainly her COPD is a contributor to this.  Suspect that persistent hypoxemia is contributing to right heart dysfunction, volume status.  Unfortunately she is unable to tolerate 5 L/min because it makes her nose burn.  She asked me to go back to 4 L/min and I ordered this today  Continue  Stiolto 2 puffs once a day. Keep albuterol available to use 2 puffs if needed for shortness of breath, wheezing, chest tightness. We will decrease your exertional flow rate on your oxygen back to 4 L/min due to the burning side effects of 5 L/min.. Follow with Dr Lamonte Sakai in 4 months or sooner if you have any problems.  Solitary pulmonary nodule Pulmonary nodule is stable over the last several CTs, is larger than it was going back to 2015.  Possible hamartoma, possible slow-growing malignancy.  She is a poor candidate for biopsy.  Given the stability we agreed that we will repeat serial CT scan in 1 year.  Baltazar Apo, MD, PhD 01/01/2018, 11:37 AM [facility] [phone] or if no answer [phone]

## 2018-01-02 DIAGNOSIS — R2681 Unsteadiness on feet: Secondary | ICD-10-CM | POA: Diagnosis not present

## 2018-01-02 DIAGNOSIS — M6281 Muscle weakness (generalized): Secondary | ICD-10-CM | POA: Diagnosis not present

## 2018-01-02 DIAGNOSIS — J441 Chronic obstructive pulmonary disease with (acute) exacerbation: Secondary | ICD-10-CM | POA: Diagnosis not present

## 2018-01-03 DIAGNOSIS — J441 Chronic obstructive pulmonary disease with (acute) exacerbation: Secondary | ICD-10-CM | POA: Diagnosis not present

## 2018-01-03 DIAGNOSIS — M6281 Muscle weakness (generalized): Secondary | ICD-10-CM | POA: Diagnosis not present

## 2018-01-03 DIAGNOSIS — R2681 Unsteadiness on feet: Secondary | ICD-10-CM | POA: Diagnosis not present

## 2018-01-04 DIAGNOSIS — J441 Chronic obstructive pulmonary disease with (acute) exacerbation: Secondary | ICD-10-CM | POA: Diagnosis not present

## 2018-01-04 DIAGNOSIS — R2681 Unsteadiness on feet: Secondary | ICD-10-CM | POA: Diagnosis not present

## 2018-01-04 DIAGNOSIS — M6281 Muscle weakness (generalized): Secondary | ICD-10-CM | POA: Diagnosis not present

## 2018-01-08 ENCOUNTER — Other Ambulatory Visit: Payer: Self-pay

## 2018-01-08 ENCOUNTER — Inpatient Hospital Stay: Payer: Medicare Other | Attending: Hematology & Oncology

## 2018-01-08 VITALS — BP 125/42 | HR 61 | Temp 98.3°F | Resp 19

## 2018-01-08 DIAGNOSIS — N189 Chronic kidney disease, unspecified: Secondary | ICD-10-CM | POA: Diagnosis present

## 2018-01-08 DIAGNOSIS — D509 Iron deficiency anemia, unspecified: Secondary | ICD-10-CM | POA: Insufficient documentation

## 2018-01-08 DIAGNOSIS — D508 Other iron deficiency anemias: Secondary | ICD-10-CM

## 2018-01-08 DIAGNOSIS — D631 Anemia in chronic kidney disease: Secondary | ICD-10-CM | POA: Diagnosis present

## 2018-01-08 MED ORDER — SODIUM CHLORIDE 0.9 % IV SOLN
750.0000 mg | Freq: Once | INTRAVENOUS | Status: AC
  Administered 2018-01-08: 750 mg via INTRAVENOUS
  Filled 2018-01-08: qty 15

## 2018-01-08 NOTE — Patient Instructions (Signed)

## 2018-01-09 DIAGNOSIS — J441 Chronic obstructive pulmonary disease with (acute) exacerbation: Secondary | ICD-10-CM | POA: Diagnosis not present

## 2018-01-09 DIAGNOSIS — M6281 Muscle weakness (generalized): Secondary | ICD-10-CM | POA: Diagnosis not present

## 2018-01-09 DIAGNOSIS — R2681 Unsteadiness on feet: Secondary | ICD-10-CM | POA: Diagnosis not present

## 2018-01-10 DIAGNOSIS — M6281 Muscle weakness (generalized): Secondary | ICD-10-CM | POA: Diagnosis not present

## 2018-01-10 DIAGNOSIS — R2681 Unsteadiness on feet: Secondary | ICD-10-CM | POA: Diagnosis not present

## 2018-01-10 DIAGNOSIS — J441 Chronic obstructive pulmonary disease with (acute) exacerbation: Secondary | ICD-10-CM | POA: Diagnosis not present

## 2018-01-11 DIAGNOSIS — R2681 Unsteadiness on feet: Secondary | ICD-10-CM | POA: Diagnosis not present

## 2018-01-11 DIAGNOSIS — M6281 Muscle weakness (generalized): Secondary | ICD-10-CM | POA: Diagnosis not present

## 2018-01-11 DIAGNOSIS — J441 Chronic obstructive pulmonary disease with (acute) exacerbation: Secondary | ICD-10-CM | POA: Diagnosis not present

## 2018-01-14 DIAGNOSIS — M6281 Muscle weakness (generalized): Secondary | ICD-10-CM | POA: Diagnosis not present

## 2018-01-14 DIAGNOSIS — R2681 Unsteadiness on feet: Secondary | ICD-10-CM | POA: Diagnosis not present

## 2018-01-14 DIAGNOSIS — J441 Chronic obstructive pulmonary disease with (acute) exacerbation: Secondary | ICD-10-CM | POA: Diagnosis not present

## 2018-01-15 DIAGNOSIS — E1165 Type 2 diabetes mellitus with hyperglycemia: Secondary | ICD-10-CM | POA: Diagnosis not present

## 2018-01-15 DIAGNOSIS — Z794 Long term (current) use of insulin: Secondary | ICD-10-CM | POA: Diagnosis not present

## 2018-01-15 DIAGNOSIS — H9190 Unspecified hearing loss, unspecified ear: Secondary | ICD-10-CM | POA: Diagnosis not present

## 2018-01-15 DIAGNOSIS — R609 Edema, unspecified: Secondary | ICD-10-CM | POA: Diagnosis not present

## 2018-01-16 DIAGNOSIS — R2681 Unsteadiness on feet: Secondary | ICD-10-CM | POA: Diagnosis not present

## 2018-01-16 DIAGNOSIS — J441 Chronic obstructive pulmonary disease with (acute) exacerbation: Secondary | ICD-10-CM | POA: Diagnosis not present

## 2018-01-16 DIAGNOSIS — M6281 Muscle weakness (generalized): Secondary | ICD-10-CM | POA: Diagnosis not present

## 2018-01-17 DIAGNOSIS — J441 Chronic obstructive pulmonary disease with (acute) exacerbation: Secondary | ICD-10-CM | POA: Diagnosis not present

## 2018-01-17 DIAGNOSIS — M6281 Muscle weakness (generalized): Secondary | ICD-10-CM | POA: Diagnosis not present

## 2018-01-17 DIAGNOSIS — R2681 Unsteadiness on feet: Secondary | ICD-10-CM | POA: Diagnosis not present

## 2018-01-18 DIAGNOSIS — R2681 Unsteadiness on feet: Secondary | ICD-10-CM | POA: Diagnosis not present

## 2018-01-18 DIAGNOSIS — J441 Chronic obstructive pulmonary disease with (acute) exacerbation: Secondary | ICD-10-CM | POA: Diagnosis not present

## 2018-01-18 DIAGNOSIS — M6281 Muscle weakness (generalized): Secondary | ICD-10-CM | POA: Diagnosis not present

## 2018-01-21 DIAGNOSIS — M6281 Muscle weakness (generalized): Secondary | ICD-10-CM | POA: Diagnosis not present

## 2018-01-21 DIAGNOSIS — R2681 Unsteadiness on feet: Secondary | ICD-10-CM | POA: Diagnosis not present

## 2018-01-21 DIAGNOSIS — J441 Chronic obstructive pulmonary disease with (acute) exacerbation: Secondary | ICD-10-CM | POA: Diagnosis not present

## 2018-01-23 DIAGNOSIS — R2681 Unsteadiness on feet: Secondary | ICD-10-CM | POA: Diagnosis not present

## 2018-01-23 DIAGNOSIS — M6281 Muscle weakness (generalized): Secondary | ICD-10-CM | POA: Diagnosis not present

## 2018-01-23 DIAGNOSIS — J441 Chronic obstructive pulmonary disease with (acute) exacerbation: Secondary | ICD-10-CM | POA: Diagnosis not present

## 2018-01-25 ENCOUNTER — Ambulatory Visit: Payer: Medicare Other

## 2018-01-25 ENCOUNTER — Ambulatory Visit: Payer: Medicare Other | Admitting: Hematology & Oncology

## 2018-01-25 ENCOUNTER — Other Ambulatory Visit: Payer: Medicare Other

## 2018-01-25 DIAGNOSIS — M6281 Muscle weakness (generalized): Secondary | ICD-10-CM | POA: Diagnosis not present

## 2018-01-25 DIAGNOSIS — J441 Chronic obstructive pulmonary disease with (acute) exacerbation: Secondary | ICD-10-CM | POA: Diagnosis not present

## 2018-01-29 ENCOUNTER — Ambulatory Visit: Payer: Medicare Other | Admitting: Hematology & Oncology

## 2018-01-29 ENCOUNTER — Ambulatory Visit: Payer: Medicare Other

## 2018-01-29 DIAGNOSIS — M6281 Muscle weakness (generalized): Secondary | ICD-10-CM | POA: Diagnosis not present

## 2018-01-29 DIAGNOSIS — J441 Chronic obstructive pulmonary disease with (acute) exacerbation: Secondary | ICD-10-CM | POA: Diagnosis not present

## 2018-01-30 DIAGNOSIS — H2521 Age-related cataract, morgagnian type, right eye: Secondary | ICD-10-CM | POA: Diagnosis not present

## 2018-01-30 DIAGNOSIS — J441 Chronic obstructive pulmonary disease with (acute) exacerbation: Secondary | ICD-10-CM | POA: Diagnosis not present

## 2018-01-30 DIAGNOSIS — M6281 Muscle weakness (generalized): Secondary | ICD-10-CM | POA: Diagnosis not present

## 2018-01-30 DIAGNOSIS — H25811 Combined forms of age-related cataract, right eye: Secondary | ICD-10-CM | POA: Diagnosis not present

## 2018-02-03 DIAGNOSIS — R0989 Other specified symptoms and signs involving the circulatory and respiratory systems: Secondary | ICD-10-CM | POA: Diagnosis not present

## 2018-02-03 DIAGNOSIS — R05 Cough: Secondary | ICD-10-CM | POA: Diagnosis not present

## 2018-02-05 DIAGNOSIS — D509 Iron deficiency anemia, unspecified: Secondary | ICD-10-CM | POA: Diagnosis not present

## 2018-02-05 DIAGNOSIS — Z7901 Long term (current) use of anticoagulants: Secondary | ICD-10-CM | POA: Diagnosis not present

## 2018-02-05 DIAGNOSIS — J449 Chronic obstructive pulmonary disease, unspecified: Secondary | ICD-10-CM | POA: Diagnosis not present

## 2018-02-05 DIAGNOSIS — E785 Hyperlipidemia, unspecified: Secondary | ICD-10-CM | POA: Diagnosis not present

## 2018-02-05 DIAGNOSIS — I5089 Other heart failure: Secondary | ICD-10-CM | POA: Diagnosis not present

## 2018-02-05 DIAGNOSIS — I4891 Unspecified atrial fibrillation: Secondary | ICD-10-CM | POA: Diagnosis not present

## 2018-02-05 DIAGNOSIS — I10 Essential (primary) hypertension: Secondary | ICD-10-CM | POA: Diagnosis not present

## 2018-02-05 DIAGNOSIS — R7989 Other specified abnormal findings of blood chemistry: Secondary | ICD-10-CM | POA: Diagnosis not present

## 2018-02-05 DIAGNOSIS — J208 Acute bronchitis due to other specified organisms: Secondary | ICD-10-CM | POA: Diagnosis not present

## 2018-02-05 DIAGNOSIS — E119 Type 2 diabetes mellitus without complications: Secondary | ICD-10-CM | POA: Diagnosis not present

## 2018-02-05 DIAGNOSIS — R6889 Other general symptoms and signs: Secondary | ICD-10-CM | POA: Diagnosis not present

## 2018-02-12 ENCOUNTER — Inpatient Hospital Stay: Payer: Medicare Other

## 2018-02-12 ENCOUNTER — Inpatient Hospital Stay: Payer: Medicare Other | Admitting: Hematology & Oncology

## 2018-02-14 DIAGNOSIS — H269 Unspecified cataract: Secondary | ICD-10-CM | POA: Diagnosis not present

## 2018-02-14 DIAGNOSIS — H25012 Cortical age-related cataract, left eye: Secondary | ICD-10-CM | POA: Diagnosis not present

## 2018-02-14 DIAGNOSIS — H25042 Posterior subcapsular polar age-related cataract, left eye: Secondary | ICD-10-CM | POA: Diagnosis not present

## 2018-02-14 DIAGNOSIS — H2512 Age-related nuclear cataract, left eye: Secondary | ICD-10-CM | POA: Diagnosis not present

## 2018-02-19 ENCOUNTER — Inpatient Hospital Stay: Payer: Medicare Other

## 2018-02-19 ENCOUNTER — Encounter: Payer: Self-pay | Admitting: Family

## 2018-02-19 ENCOUNTER — Inpatient Hospital Stay: Payer: Medicare Other | Attending: Hematology & Oncology | Admitting: Family

## 2018-02-19 ENCOUNTER — Other Ambulatory Visit: Payer: Self-pay

## 2018-02-19 VITALS — BP 117/48 | HR 65 | Temp 98.0°F | Resp 17 | Wt 309.0 lb

## 2018-02-19 VITALS — BP 112/38 | HR 66

## 2018-02-19 DIAGNOSIS — I480 Paroxysmal atrial fibrillation: Secondary | ICD-10-CM | POA: Insufficient documentation

## 2018-02-19 DIAGNOSIS — D509 Iron deficiency anemia, unspecified: Secondary | ICD-10-CM | POA: Diagnosis not present

## 2018-02-19 DIAGNOSIS — Z7901 Long term (current) use of anticoagulants: Secondary | ICD-10-CM | POA: Insufficient documentation

## 2018-02-19 DIAGNOSIS — D631 Anemia in chronic kidney disease: Secondary | ICD-10-CM | POA: Diagnosis not present

## 2018-02-19 DIAGNOSIS — Z79899 Other long term (current) drug therapy: Secondary | ICD-10-CM

## 2018-02-19 DIAGNOSIS — M7989 Other specified soft tissue disorders: Secondary | ICD-10-CM | POA: Diagnosis not present

## 2018-02-19 DIAGNOSIS — E1122 Type 2 diabetes mellitus with diabetic chronic kidney disease: Secondary | ICD-10-CM | POA: Diagnosis not present

## 2018-02-19 DIAGNOSIS — N189 Chronic kidney disease, unspecified: Secondary | ICD-10-CM | POA: Diagnosis not present

## 2018-02-19 DIAGNOSIS — R0602 Shortness of breath: Secondary | ICD-10-CM | POA: Diagnosis not present

## 2018-02-19 DIAGNOSIS — D508 Other iron deficiency anemias: Secondary | ICD-10-CM

## 2018-02-19 DIAGNOSIS — N183 Chronic kidney disease, stage 3 unspecified: Secondary | ICD-10-CM

## 2018-02-19 DIAGNOSIS — Z794 Long term (current) use of insulin: Secondary | ICD-10-CM | POA: Diagnosis not present

## 2018-02-19 MED ORDER — SODIUM CHLORIDE 0.9 % IV SOLN
750.0000 mg | Freq: Once | INTRAVENOUS | Status: AC
  Administered 2018-02-19: 750 mg via INTRAVENOUS
  Filled 2018-02-19: qty 15

## 2018-02-19 NOTE — Progress Notes (Signed)
Hematology and Oncology Follow Up Visit  Paige Hardin [id number] 1946-08-22 72 y.o. 02/19/2018   Principle Diagnosis:  Anemia secondary to iron deficiency  Anemia secondary to renal insufficiency  Chronic anticoagulation for paroxysmal atrial fibrillation  Current Therapy:  IV iron as indicated - last received in October 2018 Aranesp 300 mcg subcutaneous as needed for hemoglobin less than 11  ELIQUIS - life long   Interim History: Paige Hardin is here today with her sister for follow-up and IV iron. She is symptomatic with fatigue and increased SOB. She is still on supplemental O2.  Hgb is 8.3, ferritin 361 and iron saturation 10%.  She is getting over a chest cold and has recently finished a Z-pack. She still has some congestion and a dry cough.  No fever, chills, n/v, rash, dizziness, chest pain, palpitations, abdominal pain or changes in bowel or bladder habits.  She is doing well on Eliquis. No episodes of bleeding. She does bruise easily but not in excess.   The neuropathy in her feet is unchanged. The swelling in her lower extremities is also unchanged.  She has a good appetite and is staying hydrated. Her weight is stable.  Her blood sugars are fairly well controlled but do go up when she is on steroids.   ECOG Performance Status: 1 - Symptomatic but completely ambulatory  Medications:  Allergies as of 02/19/2018      Reactions   Ace Inhibitors Cough   Clindamycin Rash      Medication List        Accurate as of 02/19/18  1:30 PM. Always use your most recent med list.          acetaminophen 325 MG tablet Commonly known as:  TYLENOL Take 325 mg by mouth daily as needed for mild pain.   albuterol (2.5 MG/3ML) 0.083% nebulizer solution Commonly known as:  PROVENTIL Take 2.5 mg by nebulization every 6 (six) hours as needed for wheezing or shortness of breath.   albuterol 108 (90 Base) MCG/ACT inhaler Commonly known as:  PROVENTIL HFA;VENTOLIN HFA Inhale 2 puffs  into the lungs every 6 (six) hours as needed for wheezing or shortness of breath.   allopurinol 100 MG tablet Commonly known as:  ZYLOPRIM Take 1 tablet (100 mg total) by mouth daily.   amiodarone 200 MG tablet Commonly known as:  PACERONE Take 1 tablet (200 mg total) by mouth daily.   apixaban 5 MG Tabs tablet Commonly known as:  ELIQUIS Take 1 tablet (5 mg total) by mouth 2 (two) times daily.   calcium-vitamin D 500-200 MG-UNIT tablet Commonly known as:  OSCAL WITH D Take 1 tablet by mouth daily with breakfast.   DIABETIC TUSSIN EX 100 MG/5ML liquid Generic drug:  guaiFENesin Take 200 mg by mouth 3 (three) times daily as needed for cough.   diltiazem 180 MG 24 hr capsule Commonly known as:  CARDIZEM CD Take 1 capsule (180 mg total) by mouth daily.   ezetimibe-simvastatin 10-40 MG tablet Commonly known as:  VYTORIN Take 1 tablet by mouth at bedtime.   ferrous sulfate 325 (65 FE) MG tablet Take 325 mg by mouth 2 (two) times daily with a meal.   furosemide 80 MG tablet Commonly known as:  LASIX Take 2 tablets (160 mg total) by mouth 2 (two) times daily. MAKE SURE TO TAKE AT 8 AM AND 2 PM   insulin aspart 100 UNIT/ML injection Commonly known as:  novoLOG Inject 0-9 Units into the skin 3 (three) times  daily with meals. Correction factor Sliding scale CBG 70 - 120: 0 units CBG 121 - 150: 1 unit,  CBG 151 - 200: 2 units,  CBG 201 - 250: 3 units,  CBG 251 - 300: 5 units,  CBG 301 - 350: 7 units,  CBG 351 - 400: 9 units   CBG > 400: 9 units and notify your MD   insulin NPH Human 100 UNIT/ML injection Commonly known as:  HUMULIN N,NOVOLIN N Inject 20 Units into the skin 2 (two) times daily before a meal.   metolazone 2.5 MG tablet Commonly known as:  ZAROXOLYN Every other Wednesday   metoprolol tartrate 50 MG tablet Commonly known as:  LOPRESSOR Take 1 tablet (50 mg total) by mouth 2 (two) times daily.   MULTIVITAMIN/IRON PO Take 1 tablet by mouth daily.   omeprazole  20 MG capsule Commonly known as:  PRILOSEC Take 20 mg by mouth daily.   ondansetron 4 MG tablet Commonly known as:  ZOFRAN   OXYGEN Inhale 2 L into the lungs continuous.   potassium chloride SA 20 MEQ tablet Commonly known as:  K-DUR,KLOR-CON Take 20 mEq by mouth 3 (three) times daily. MAKE SURE TO TAKE EXTRA 20 MEQ ON METOLAZONE DAYS   Tiotropium Bromide-Olodaterol 2.5-2.5 MCG/ACT Aers Commonly known as:  STIOLTO RESPIMAT Inhale 2 puffs into the lungs daily.   vitamin B-12 500 MCG tablet Commonly known as:  CYANOCOBALAMIN Take 500 mcg by mouth daily.   Vitamin C 500 MG Caps Take 500 mg by mouth daily.   Vitamin D 2000 units tablet Take 2,000 Units by mouth daily.       Allergies:  Allergies  Allergen Reactions  . Ace Inhibitors Cough  . Clindamycin Rash    Past Medical History, Surgical history, Social history, and Family History were reviewed and updated.  Review of Systems: All other 10 point review of systems is negative.   Physical Exam:  weight is 309 lb (140.2 kg) (abnormal). Her oral temperature is 98 F (36.7 C). Her blood pressure is 117/48 (abnormal) and her pulse is 65. Her respiration is 17 and oxygen saturation is 93%.   Wt Readings from Last 3 Encounters:  02/19/18 (!) 309 lb (140.2 kg)  01/01/18 (!) 307 lb (139.3 kg)  12/21/17 (!) 302 lb (137 kg)    Ocular: Sclerae unicteric, pupils equal, round and reactive to light Ear-nose-throat: Oropharynx clear, dentition fair Lymphatic: No cervical, supraclavicular or axillary adenopathy Lungs no rales or rhonchi, good excursion bilaterally Heart regular rate and rhythm, no murmur appreciated Abd soft, nontender, positive bowel sounds, no liver or spleen tip palpated on exam, no fluid wave  MSK no focal spinal tenderness, no joint edema Neuro: non-focal, well-oriented, appropriate affect Breasts: Deferred   Lab Results  Component Value Date   WBC 10.3 12/21/2017   HGB 10.0 (L) 10/16/2017   HCT  30.2 (L) 12/21/2017   MCV 101.3 (H) 12/21/2017   PLT 249 12/21/2017   Lab Results  Component Value Date   FERRITIN 247 12/21/2017   IRON 49 12/21/2017   TIBC 260 12/21/2017   UIBC 211 12/21/2017   IRONPCTSAT 19 (L) 12/21/2017   Lab Results  Component Value Date   RETICCTPCT 4.5 (H) 12/21/2017   RBC 3.02 (L) 12/21/2017   RETICCTABS 89.0 09/21/2015   No results found for: KPAFRELGTCHN, LAMBDASER, KAPLAMBRATIO No results found for: IGGSERUM, IGA, IGMSERUM No results found for: TOTALPROTELP, ALBUMINELP, A1GS, A2GS, BETS, BETA2SER, GAMS, [place]man, SPEI   Chemistry  Component Value Date/Time   NA 143 12/21/2017 0922   NA 141 10/16/2017 1106   NA 140 07/31/2017 1028   K 3.8 12/21/2017 0922   K 3.8 10/16/2017 1106   K 4.2 07/31/2017 1028   CL 94 (L) 12/21/2017 0922   CL 92 (L) 10/16/2017 1106   CO2 34 (H) 12/21/2017 0922   CO2 31 10/16/2017 1106   CO2 35 (H) 07/31/2017 1028   BUN 42 (H) 12/21/2017 0922   BUN 40 (H) 10/16/2017 1106   BUN 42.7 (H) 07/31/2017 1028   CREATININE 1.80 (H) 12/21/2017 0922   CREATININE 1.7 (H) 10/16/2017 1106   CREATININE 1.5 (H) 07/31/2017 1028      Component Value Date/Time   CALCIUM 9.6 12/21/2017 0922   CALCIUM 9.5 10/16/2017 1106   CALCIUM 10.1 07/31/2017 1028   ALKPHOS 65 12/21/2017 0922   ALKPHOS 68 10/16/2017 1106   ALKPHOS 69 07/31/2017 1028   AST 20 12/21/2017 0922   AST 14 07/31/2017 1028   ALT 18 12/21/2017 0922   ALT 15 10/16/2017 1106   ALT 14 07/31/2017 1028   BILITOT 0.7 12/21/2017 0922   BILITOT 0.54 07/31/2017 1028      Impression and Plan: Ms. Lubas is a very pleasant 72 yo caucasian female with both iron deficiency anemia and anemia of chronic renal failure.  She is symptomatic with anemia today and iron saturation is 10%.  We will give her IV iron today and again next week.  We will plan to see her back in another 2 months for follow-up.  She will contact our office with any questions or concerns. We can certainly  see her sooner if need be.     Laverna Peace, NP 3/26/20191:30 PM

## 2018-02-26 ENCOUNTER — Inpatient Hospital Stay: Payer: Medicare Other | Attending: Hematology & Oncology

## 2018-02-26 VITALS — BP 111/53 | HR 64 | Temp 97.9°F | Resp 20

## 2018-02-26 DIAGNOSIS — D509 Iron deficiency anemia, unspecified: Secondary | ICD-10-CM | POA: Diagnosis not present

## 2018-02-26 DIAGNOSIS — D631 Anemia in chronic kidney disease: Secondary | ICD-10-CM | POA: Diagnosis present

## 2018-02-26 DIAGNOSIS — D508 Other iron deficiency anemias: Secondary | ICD-10-CM

## 2018-02-26 DIAGNOSIS — N189 Chronic kidney disease, unspecified: Secondary | ICD-10-CM | POA: Diagnosis present

## 2018-02-26 MED ORDER — SODIUM CHLORIDE 0.9 % IV SOLN
750.0000 mg | Freq: Once | INTRAVENOUS | Status: AC
  Administered 2018-02-26: 750 mg via INTRAVENOUS
  Filled 2018-02-26: qty 15

## 2018-02-26 NOTE — Patient Instructions (Signed)
Ferric carboxymaltose injection What is this medicine? FERRIC CARBOXYMALTOSE (ferr-ik car-box-ee-mol-toes) is an iron complex. Iron is used to make healthy red blood cells, which carry oxygen and nutrients throughout the body. This medicine is used to treat anemia in people with chronic kidney disease or people who cannot take iron by mouth. This medicine may be used for other purposes; ask your health care provider or pharmacist if you have questions. COMMON BRAND NAME(S): Injectafer What should I tell my health care provider before I take this medicine? They need to know if you have any of these conditions: -anemia not caused by low iron levels -high levels of iron in the blood -liver disease -an unusual or allergic reaction to iron, other medicines, foods, dyes, or preservatives -pregnant or trying to get pregnant -breast-feeding How should I use this medicine? This medicine is for infusion into a vein. It is given by a health care professional in a hospital or clinic setting. Talk to your pediatrician regarding the use of this medicine in children. Special care may be needed. Overdosage: If you think you have taken too much of this medicine contact a poison control center or emergency room at once. NOTE: This medicine is only for you. Do not share this medicine with others. What if I miss a dose? It is important not to miss your dose. Call your doctor or health care professional if you are unable to keep an appointment. What may interact with this medicine? Do not take this medicine with any of the following medications: -deferoxamine -dimercaprol -other iron products This medicine may also interact with the following medications: -chloramphenicol -deferasirox This list may not describe all possible interactions. Give your health care provider a list of all the medicines, herbs, non-prescription drugs, or dietary supplements you use. Also tell them if you smoke, drink alcohol, or use  illegal drugs. Some items may interact with your medicine. What should I watch for while using this medicine? Visit your doctor or health care professional regularly. Tell your doctor if your symptoms do not start to get better or if they get worse. You may need blood work done while you are taking this medicine. You may need to follow a special diet. Talk to your doctor. Foods that contain iron include: whole grains/cereals, dried fruits, beans, or peas, leafy green vegetables, and organ meats (liver, kidney). What side effects may I notice from receiving this medicine? Side effects that you should report to your doctor or health care professional as soon as possible: -allergic reactions like skin rash, itching or hives, swelling of the face, lips, or tongue -breathing problems -changes in blood pressure -feeling faint or lightheaded, falls -flushing, sweating, or hot feelings Side effects that usually do not require medical attention (report to your doctor or health care professional if they continue or are bothersome): -changes in taste -constipation -dizziness -headache -nausea -pain, redness, or irritation at site where injected -vomiting This list may not describe all possible side effects. Call your doctor for medical advice about side effects. You may report side effects to FDA at 1-800-FDA-1088. Where should I keep my medicine? This drug is given in a hospital or clinic and will not be stored at home. NOTE: This sheet is a summary. It may not cover all possible information. If you have questions about this medicine, talk to your doctor, pharmacist, or health care provider.  2018 Elsevier/Gold Standard (2015-12-16 11:20:47)  

## 2018-02-27 ENCOUNTER — Encounter: Payer: Self-pay | Admitting: Emergency Medicine

## 2018-02-27 NOTE — Telephone Encounter (Signed)
RB- please see pt email and advise if okay to cancel the super d chest ct for 05/07/18    In my last visit with Dr Lamonte Sakai, he indicated that we would be able to have more infrequent CT scans as a result of little change in the nodule in my lung.I thought he said every year.My last CT scan was 1/22/2019and my last visit with him was 01/01/2018.  This week I received a call to set up a CT scan in April or May.I expected not to do another one until late this year or early next year.I have difficulty getting to appointments and I don't want to do anything that isnot absolutely necessary. Would you check and verify that my understanding is correct before I cancel the CT appointment?Thank you.

## 2018-02-28 NOTE — Telephone Encounter (Signed)
She is correct. We do not need to repeat a CT chest until January 2020. Thanks.

## 2018-03-26 ENCOUNTER — Encounter: Payer: Self-pay | Admitting: Hematology & Oncology

## 2018-04-10 DIAGNOSIS — H2522 Age-related cataract, morgagnian type, left eye: Secondary | ICD-10-CM | POA: Diagnosis not present

## 2018-04-10 DIAGNOSIS — H25812 Combined forms of age-related cataract, left eye: Secondary | ICD-10-CM | POA: Diagnosis not present

## 2018-04-10 DIAGNOSIS — H2589 Other age-related cataract: Secondary | ICD-10-CM | POA: Diagnosis not present

## 2018-04-23 ENCOUNTER — Inpatient Hospital Stay: Payer: Medicare Other

## 2018-04-23 ENCOUNTER — Inpatient Hospital Stay: Payer: Medicare Other | Attending: Hematology & Oncology | Admitting: Family

## 2018-04-23 VITALS — BP 121/51 | HR 57 | Temp 97.8°F | Resp 16 | Wt 298.5 lb

## 2018-04-23 DIAGNOSIS — D631 Anemia in chronic kidney disease: Secondary | ICD-10-CM

## 2018-04-23 DIAGNOSIS — D509 Iron deficiency anemia, unspecified: Secondary | ICD-10-CM | POA: Diagnosis not present

## 2018-04-23 DIAGNOSIS — Z794 Long term (current) use of insulin: Secondary | ICD-10-CM | POA: Diagnosis not present

## 2018-04-23 DIAGNOSIS — N183 Chronic kidney disease, stage 3 (moderate): Secondary | ICD-10-CM

## 2018-04-23 DIAGNOSIS — E119 Type 2 diabetes mellitus without complications: Secondary | ICD-10-CM | POA: Insufficient documentation

## 2018-04-23 DIAGNOSIS — I480 Paroxysmal atrial fibrillation: Secondary | ICD-10-CM | POA: Diagnosis not present

## 2018-04-23 DIAGNOSIS — Z7901 Long term (current) use of anticoagulants: Secondary | ICD-10-CM | POA: Insufficient documentation

## 2018-04-23 DIAGNOSIS — D508 Other iron deficiency anemias: Secondary | ICD-10-CM

## 2018-04-23 DIAGNOSIS — Z9981 Dependence on supplemental oxygen: Secondary | ICD-10-CM | POA: Diagnosis not present

## 2018-04-23 DIAGNOSIS — Z79899 Other long term (current) drug therapy: Secondary | ICD-10-CM | POA: Insufficient documentation

## 2018-04-23 DIAGNOSIS — N189 Chronic kidney disease, unspecified: Secondary | ICD-10-CM | POA: Insufficient documentation

## 2018-04-23 DIAGNOSIS — R042 Hemoptysis: Secondary | ICD-10-CM

## 2018-04-23 LAB — CBC WITH DIFFERENTIAL (CANCER CENTER ONLY)
Basophils Absolute: 0 10*3/uL (ref 0.0–0.1)
Basophils Relative: 0 %
EOS ABS: 0.2 10*3/uL (ref 0.0–0.5)
EOS PCT: 2 %
HCT: 26.4 % — ABNORMAL LOW (ref 34.8–46.6)
HEMOGLOBIN: 7.6 g/dL — AB (ref 11.6–15.9)
LYMPHS ABS: 0.5 10*3/uL — AB (ref 0.9–3.3)
Lymphocytes Relative: 5 %
MCH: 30.2 pg (ref 26.0–34.0)
MCHC: 28.8 g/dL — ABNORMAL LOW (ref 32.0–36.0)
MCV: 104.8 fL — AB (ref 81.0–101.0)
Monocytes Absolute: 0.6 10*3/uL (ref 0.1–0.9)
Monocytes Relative: 6 %
NEUTROS PCT: 87 %
Neutro Abs: 8.6 10*3/uL — ABNORMAL HIGH (ref 1.5–6.5)
PLATELETS: 217 10*3/uL (ref 145–400)
RBC: 2.52 MIL/uL — AB (ref 3.70–5.32)
RDW: 18 % — ABNORMAL HIGH (ref 11.1–15.7)
WBC: 10 10*3/uL (ref 3.9–10.0)

## 2018-04-23 LAB — CMP (CANCER CENTER ONLY)
ALT: 22 U/L (ref 10–47)
AST: 23 U/L (ref 11–38)
Albumin: 3.2 g/dL — ABNORMAL LOW (ref 3.5–5.0)
Alkaline Phosphatase: 58 U/L (ref 26–84)
Anion gap: 14 (ref 5–15)
BUN: 36 mg/dL — AB (ref 7–22)
CALCIUM: 10 mg/dL (ref 8.0–10.3)
CO2: 36 mmol/L — ABNORMAL HIGH (ref 18–33)
Chloride: 95 mmol/L — ABNORMAL LOW (ref 98–108)
Creatinine: 1.7 mg/dL — ABNORMAL HIGH (ref 0.60–1.20)
GLUCOSE: 48 mg/dL — AB (ref 73–118)
Potassium: 4.1 mmol/L (ref 3.3–4.7)
SODIUM: 145 mmol/L (ref 128–145)
TOTAL PROTEIN: 6.8 g/dL (ref 6.4–8.1)
Total Bilirubin: 0.7 mg/dL (ref 0.2–1.6)

## 2018-04-23 LAB — RETICULOCYTES
RBC.: 2.54 MIL/uL — ABNORMAL LOW (ref 3.70–5.45)
Retic Count, Absolute: 139.7 10*3/uL — ABNORMAL HIGH (ref 33.7–90.7)
Retic Ct Pct: 5.5 % — ABNORMAL HIGH (ref 0.7–2.1)

## 2018-04-23 MED ORDER — DARBEPOETIN ALFA 300 MCG/0.6ML IJ SOSY
300.0000 ug | PREFILLED_SYRINGE | Freq: Once | INTRAMUSCULAR | Status: AC
  Administered 2018-04-23: 300 ug via SUBCUTANEOUS

## 2018-04-23 MED ORDER — DARBEPOETIN ALFA 300 MCG/0.6ML IJ SOSY
PREFILLED_SYRINGE | INTRAMUSCULAR | Status: AC
  Filled 2018-04-23: qty 0.6

## 2018-04-23 NOTE — Progress Notes (Signed)
Hematology and Oncology Follow Up Visit  Paige Hardin [id number] [birth year] 72 y.o. 04/23/2018   Principle Diagnosis:  Anemia secondary to iron deficiency  Anemia secondary to renal insufficiency  Chronic anticoagulation for paroxysmal atrial fibrillation  Current Therapy:   IV iron as indicated - last received in October 2018 Aranesp 300 mcg subcutaneous as needed for hemoglobin less than 11  ELIQUIS - life long   Interim History:  Paige Hardin is here today with her sister for follow-up. She is doing well and has no new complaints at this time. Her Hgb today is 7.6. She states that her baseline SOB is no worse. She is on 2-4 L [place] (depending on activity level) of supplemental O2 24 hours a day.  No fever, chills, n/v, cough, rash, dizziness, chest pain, palpitations, abdominal pain or changes in bowel or bladder habits.  She states that her blood sugars are stable. She eats at regular intervals where she is on insulin. She is hydrating well and weight is down 11 lbs since her last visit.  She has not noted any rectal bleeding. Her nose will bleed a little when dry due to the oxygen but this stops in under 10 minutes. She does bruise easily on Eliquis but not in excess.  No lymphadenopathy noted on exam.   ECOG Performance Status: 1 - Symptomatic but completely ambulatory  Medications:  Allergies as of 04/23/2018      Reactions   Ace Inhibitors Cough   Clindamycin Rash      Medication List        Accurate as of 04/23/18 11:41 AM. Always use your most recent med list.          acetaminophen 325 MG tablet Commonly known as:  TYLENOL Take 325 mg by mouth daily as needed for mild pain.   albuterol (2.5 MG/3ML) 0.083% nebulizer solution Commonly known as:  PROVENTIL Take 2.5 mg by nebulization every 6 (six) hours as needed for wheezing or shortness of breath.   albuterol 108 (90 Base) MCG/ACT inhaler Commonly known as:  PROVENTIL HFA;VENTOLIN HFA Inhale 2 puffs into the  lungs every 6 (six) hours as needed for wheezing or shortness of breath.   allopurinol 100 MG tablet Commonly known as:  ZYLOPRIM Take 1 tablet (100 mg total) by mouth daily.   amiodarone 200 MG tablet Commonly known as:  PACERONE Take 1 tablet (200 mg total) by mouth daily.   apixaban 5 MG Tabs tablet Commonly known as:  ELIQUIS Take 1 tablet (5 mg total) by mouth 2 (two) times daily.   calcium-vitamin D 500-200 MG-UNIT tablet Commonly known as:  OSCAL WITH D Take 1 tablet by mouth daily with breakfast.   DIABETIC TUSSIN EX 100 MG/5ML liquid Generic drug:  guaiFENesin Take 200 mg by mouth 3 (three) times daily as needed for cough.   diltiazem 180 MG 24 hr capsule Commonly known as:  CARDIZEM CD Take 1 capsule (180 mg total) by mouth daily.   ezetimibe-simvastatin 10-40 MG tablet Commonly known as:  VYTORIN Take 1 tablet by mouth at bedtime.   ferrous sulfate 325 (65 FE) MG tablet Take 325 mg by mouth 2 (two) times daily with a meal.   furosemide 80 MG tablet Commonly known as:  LASIX Take 2 tablets (160 mg total) by mouth 2 (two) times daily. MAKE SURE TO TAKE AT 8 AM AND 2 PM   insulin aspart 100 UNIT/ML injection Commonly known as:  novoLOG Inject 0-9 Units into the skin  3 (three) times daily with meals. Correction factor Sliding scale CBG 70 - 120: 0 units CBG 121 - 150: 1 unit,  CBG 151 - 200: 2 units,  CBG 201 - 250: 3 units,  CBG 251 - 300: 5 units,  CBG 301 - 350: 7 units,  CBG 351 - 400: 9 units   CBG > 400: 9 units and notify your MD   insulin NPH Human 100 UNIT/ML injection Commonly known as:  HUMULIN N,NOVOLIN N Inject 20 Units into the skin 2 (two) times daily before a meal.   metolazone 2.5 MG tablet Commonly known as:  ZAROXOLYN Every other Wednesday   metoprolol tartrate 50 MG tablet Commonly known as:  LOPRESSOR Take 1 tablet (50 mg total) by mouth 2 (two) times daily.   MULTIVITAMIN/IRON PO Take 1 tablet by mouth daily.   omeprazole 20 MG  capsule Commonly known as:  PRILOSEC Take 20 mg by mouth daily.   ondansetron 4 MG tablet Commonly known as:  ZOFRAN   OXYGEN Inhale 2 L into the lungs continuous.   potassium chloride SA 20 MEQ tablet Commonly known as:  K-DUR,KLOR-CON Take 20 mEq by mouth 3 (three) times daily. MAKE SURE TO TAKE EXTRA 20 MEQ ON METOLAZONE DAYS   Tiotropium Bromide-Olodaterol 2.5-2.5 MCG/ACT Aers Commonly known as:  STIOLTO RESPIMAT Inhale 2 puffs into the lungs daily.   vitamin B-12 500 MCG tablet Commonly known as:  CYANOCOBALAMIN Take 500 mcg by mouth daily.   Vitamin C 500 MG Caps Take 500 mg by mouth daily.   Vitamin D 2000 units tablet Take 2,000 Units by mouth daily.       Allergies:  Allergies  Allergen Reactions  . Ace Inhibitors Cough  . Clindamycin Rash    Past Medical History, Surgical history, Social history, and Family History were reviewed and updated.  Review of Systems: All other 10 point review of systems is negative.   Physical Exam:  weight is 298 lb 8 oz (135.4 kg). Her oral temperature is 97.8 F (36.6 C). Her blood pressure is 121/51 (abnormal) and her pulse is 57 (abnormal). Her respiration is 16 and oxygen saturation is 93%.   Wt Readings from Last 3 Encounters:  04/23/18 298 lb 8 oz (135.4 kg)  02/19/18 (!) 309 lb (140.2 kg)  01/01/18 (!) 307 lb (139.3 kg)    Ocular: Sclerae unicteric, pupils equal, round and reactive to light Ear-nose-throat: Oropharynx clear, dentition fair Lymphatic: No cervical, supraclavicular or axillary adenopathy Lungs no rales or rhonchi, good excursion bilaterally Heart regular rate and rhythm, no murmur appreciated Abd soft, nontender, positive bowel sounds, no liver or spleen tip palpated on exam, no fluid wave  MSK no focal spinal tenderness, no joint edema Neuro: non-focal, well-oriented, appropriate affect Breasts: Deferred   Lab Results  Component Value Date   WBC 10.0 04/23/2018   HGB 7.6 (L) 04/23/2018    HCT 26.4 (L) 04/23/2018   MCV 104.8 (H) 04/23/2018   PLT 217 04/23/2018   Lab Results  Component Value Date   FERRITIN 247 12/21/2017   IRON 49 12/21/2017   TIBC 260 12/21/2017   UIBC 211 12/21/2017   IRONPCTSAT 19 (L) 12/21/2017   Lab Results  Component Value Date   RETICCTPCT 4.5 (H) 12/21/2017   RBC 2.52 (L) 04/23/2018   RETICCTABS 89.0 09/21/2015   No results found for: KPAFRELGTCHN, LAMBDASER, KAPLAMBRATIO No results found for: IGGSERUM, IGA, IGMSERUM No results found for: TOTALPROTELP, ALBUMINELP, A1GS, A2GS, BETS, BETA2SER, [place], MSPIKE,  SPEI   Chemistry      Component Value Date/Time   NA 143 12/21/2017 0922   NA 141 10/16/2017 1106   NA 140 07/31/2017 1028   K 3.8 12/21/2017 0922   K 3.8 10/16/2017 1106   K 4.2 07/31/2017 1028   CL 94 (L) 12/21/2017 0922   CL 92 (L) 10/16/2017 1106   CO2 34 (H) 12/21/2017 0922   CO2 31 10/16/2017 1106   CO2 35 (H) 07/31/2017 1028   BUN 42 (H) 12/21/2017 0922   BUN 40 (H) 10/16/2017 1106   BUN 42.7 (H) 07/31/2017 1028   CREATININE 1.80 (H) 12/21/2017 0922   CREATININE 1.7 (H) 10/16/2017 1106   CREATININE 1.5 (H) 07/31/2017 1028      Component Value Date/Time   CALCIUM 9.6 12/21/2017 0922   CALCIUM 9.5 10/16/2017 1106   CALCIUM 10.1 07/31/2017 1028   ALKPHOS 65 12/21/2017 0922   ALKPHOS 68 10/16/2017 1106   ALKPHOS 69 07/31/2017 1028   AST 20 12/21/2017 0922   AST 14 07/31/2017 1028   ALT 18 12/21/2017 0922   ALT 15 10/16/2017 1106   ALT 14 07/31/2017 1028   BILITOT 0.7 12/21/2017 0922   BILITOT 0.54 07/31/2017 1028      Impression and Plan: Ms. Villers is a very pleasant 72 yo caucasian female with both iron deficeincy anemia and anemia of chronic renal failure. Hgb is 7.6 and she is doing well. Her normal symptoms of fatigue and SOB are unchanged from baseline.  I spoke with Dr. Marin Olp and we will hold off on transfusing her. She was given Aranesp today and we will see what her iron studies show bringing her back in  for infusion if needed.  Her blood sugar came back at 48. The patient had already left our office so called to let her know. She had gotten a Coke and had some lunch after she left and was feeling fine. We will go ahead and plan to see her back in another 4 weeks for follow-up and repeat labs.  They will contact our office with any questions or concerns. We can certainly see her sooner if needed.   Laverna Peace, NP 5/28/201911:41 AM

## 2018-04-24 LAB — IRON AND TIBC
Iron: 50 ug/dL (ref 41–142)
SATURATION RATIOS: 20 % — AB (ref 21–57)
TIBC: 255 ug/dL (ref 236–444)
UIBC: 205 ug/dL

## 2018-04-24 LAB — FERRITIN: Ferritin: 341 ng/mL — ABNORMAL HIGH (ref 9–269)

## 2018-04-29 DIAGNOSIS — B351 Tinea unguium: Secondary | ICD-10-CM | POA: Diagnosis not present

## 2018-04-29 DIAGNOSIS — S91111A Laceration without foreign body of right great toe without damage to nail, initial encounter: Secondary | ICD-10-CM | POA: Diagnosis not present

## 2018-04-30 ENCOUNTER — Other Ambulatory Visit: Payer: Self-pay

## 2018-04-30 ENCOUNTER — Inpatient Hospital Stay: Payer: Medicare Other | Attending: Hematology & Oncology

## 2018-04-30 VITALS — BP 103/47 | HR 60 | Temp 97.8°F | Resp 18

## 2018-04-30 DIAGNOSIS — D631 Anemia in chronic kidney disease: Secondary | ICD-10-CM | POA: Insufficient documentation

## 2018-04-30 DIAGNOSIS — D509 Iron deficiency anemia, unspecified: Secondary | ICD-10-CM | POA: Insufficient documentation

## 2018-04-30 DIAGNOSIS — N189 Chronic kidney disease, unspecified: Secondary | ICD-10-CM | POA: Diagnosis not present

## 2018-04-30 DIAGNOSIS — D508 Other iron deficiency anemias: Secondary | ICD-10-CM

## 2018-04-30 MED ORDER — SODIUM CHLORIDE 0.9 % IV SOLN
750.0000 mg | Freq: Once | INTRAVENOUS | Status: AC
  Administered 2018-04-30: 750 mg via INTRAVENOUS
  Filled 2018-04-30: qty 15

## 2018-04-30 MED ORDER — SODIUM CHLORIDE 0.9 % IV SOLN
INTRAVENOUS | Status: DC
  Administered 2018-04-30: 14:00:00 via INTRAVENOUS

## 2018-04-30 NOTE — Patient Instructions (Signed)
Ferric carboxymaltose injection What is this medicine? FERRIC CARBOXYMALTOSE (ferr-ik car-box-ee-mol-toes) is an iron complex. Iron is used to make healthy red blood cells, which carry oxygen and nutrients throughout the body. This medicine is used to treat anemia in people with chronic kidney disease or people who cannot take iron by mouth. This medicine may be used for other purposes; ask your health care provider or pharmacist if you have questions. COMMON BRAND NAME(S): Injectafer What should I tell my health care provider before I take this medicine? They need to know if you have any of these conditions: -anemia not caused by low iron levels -high levels of iron in the blood -liver disease -an unusual or allergic reaction to iron, other medicines, foods, dyes, or preservatives -pregnant or trying to get pregnant -breast-feeding How should I use this medicine? This medicine is for infusion into a vein. It is given by a health care professional in a hospital or clinic setting. Talk to your pediatrician regarding the use of this medicine in children. Special care may be needed. Overdosage: If you think you have taken too much of this medicine contact a poison control center or emergency room at once. NOTE: This medicine is only for you. Do not share this medicine with others. What if I miss a dose? It is important not to miss your dose. Call your doctor or health care professional if you are unable to keep an appointment. What may interact with this medicine? Do not take this medicine with any of the following medications: -deferoxamine -dimercaprol -other iron products This medicine may also interact with the following medications: -chloramphenicol -deferasirox This list may not describe all possible interactions. Give your health care provider a list of all the medicines, herbs, non-prescription drugs, or dietary supplements you use. Also tell them if you smoke, drink alcohol, or use  illegal drugs. Some items may interact with your medicine. What should I watch for while using this medicine? Visit your doctor or health care professional regularly. Tell your doctor if your symptoms do not start to get better or if they get worse. You may need blood work done while you are taking this medicine. You may need to follow a special diet. Talk to your doctor. Foods that contain iron include: whole grains/cereals, dried fruits, beans, or peas, leafy green vegetables, and organ meats (liver, kidney). What side effects may I notice from receiving this medicine? Side effects that you should report to your doctor or health care professional as soon as possible: -allergic reactions like skin rash, itching or hives, swelling of the face, lips, or tongue -breathing problems -changes in blood pressure -feeling faint or lightheaded, falls -flushing, sweating, or hot feelings Side effects that usually do not require medical attention (report to your doctor or health care professional if they continue or are bothersome): -changes in taste -constipation -dizziness -headache -nausea -pain, redness, or irritation at site where injected -vomiting This list may not describe all possible side effects. Call your doctor for medical advice about side effects. You may report side effects to FDA at 1-800-FDA-1088. Where should I keep my medicine? This drug is given in a hospital or clinic and will not be stored at home. NOTE: This sheet is a summary. It may not cover all possible information. If you have questions about this medicine, talk to your doctor, pharmacist, or health care provider.  2018 Elsevier/Gold Standard (2015-12-16 11:20:47)  

## 2018-05-07 ENCOUNTER — Inpatient Hospital Stay: Admission: RE | Admit: 2018-05-07 | Payer: Medicare Other | Source: Ambulatory Visit

## 2018-05-21 ENCOUNTER — Inpatient Hospital Stay: Payer: Medicare Other

## 2018-05-21 ENCOUNTER — Inpatient Hospital Stay (HOSPITAL_BASED_OUTPATIENT_CLINIC_OR_DEPARTMENT_OTHER)
Admission: EM | Admit: 2018-05-21 | Discharge: 2018-05-23 | DRG: 378 | Disposition: A | Discharge status: Skilled Nursing Facility | Payer: Medicare Other | Attending: Internal Medicine | Admitting: Internal Medicine

## 2018-05-21 ENCOUNTER — Encounter (HOSPITAL_BASED_OUTPATIENT_CLINIC_OR_DEPARTMENT_OTHER): Payer: Self-pay | Admitting: *Deleted

## 2018-05-21 ENCOUNTER — Inpatient Hospital Stay (HOSPITAL_BASED_OUTPATIENT_CLINIC_OR_DEPARTMENT_OTHER): Payer: Medicare Other | Admitting: Hematology & Oncology

## 2018-05-21 ENCOUNTER — Other Ambulatory Visit: Payer: Self-pay

## 2018-05-21 VITALS — BP 106/39 | HR 56 | Temp 98.2°F | Resp 18 | Wt 304.1 lb

## 2018-05-21 DIAGNOSIS — Z794 Long term (current) use of insulin: Secondary | ICD-10-CM

## 2018-05-21 DIAGNOSIS — N183 Chronic kidney disease, stage 3 (moderate): Secondary | ICD-10-CM

## 2018-05-21 DIAGNOSIS — D631 Anemia in chronic kidney disease: Secondary | ICD-10-CM

## 2018-05-21 DIAGNOSIS — E1165 Type 2 diabetes mellitus with hyperglycemia: Secondary | ICD-10-CM | POA: Diagnosis present

## 2018-05-21 DIAGNOSIS — R279 Unspecified lack of coordination: Secondary | ICD-10-CM | POA: Diagnosis not present

## 2018-05-21 DIAGNOSIS — Z66 Do not resuscitate: Secondary | ICD-10-CM | POA: Diagnosis present

## 2018-05-21 DIAGNOSIS — J449 Chronic obstructive pulmonary disease, unspecified: Secondary | ICD-10-CM | POA: Diagnosis present

## 2018-05-21 DIAGNOSIS — R0602 Shortness of breath: Secondary | ICD-10-CM | POA: Diagnosis not present

## 2018-05-21 DIAGNOSIS — E785 Hyperlipidemia, unspecified: Secondary | ICD-10-CM | POA: Diagnosis present

## 2018-05-21 DIAGNOSIS — E119 Type 2 diabetes mellitus without complications: Secondary | ICD-10-CM

## 2018-05-21 DIAGNOSIS — M1990 Unspecified osteoarthritis, unspecified site: Secondary | ICD-10-CM | POA: Diagnosis present

## 2018-05-21 DIAGNOSIS — R5383 Other fatigue: Secondary | ICD-10-CM | POA: Diagnosis not present

## 2018-05-21 DIAGNOSIS — I130 Hypertensive heart and chronic kidney disease with heart failure and stage 1 through stage 4 chronic kidney disease, or unspecified chronic kidney disease: Secondary | ICD-10-CM | POA: Diagnosis present

## 2018-05-21 DIAGNOSIS — R530 Neoplastic (malignant) related fatigue: Secondary | ICD-10-CM | POA: Diagnosis not present

## 2018-05-21 DIAGNOSIS — H1130 Conjunctival hemorrhage, unspecified eye: Secondary | ICD-10-CM | POA: Diagnosis present

## 2018-05-21 DIAGNOSIS — J9611 Chronic respiratory failure with hypoxia: Secondary | ICD-10-CM | POA: Diagnosis present

## 2018-05-21 DIAGNOSIS — K921 Melena: Principal | ICD-10-CM | POA: Diagnosis present

## 2018-05-21 DIAGNOSIS — K2971 Gastritis, unspecified, with bleeding: Secondary | ICD-10-CM | POA: Diagnosis not present

## 2018-05-21 DIAGNOSIS — K219 Gastro-esophageal reflux disease without esophagitis: Secondary | ICD-10-CM | POA: Diagnosis present

## 2018-05-21 DIAGNOSIS — Z7901 Long term (current) use of anticoagulants: Secondary | ICD-10-CM

## 2018-05-21 DIAGNOSIS — Z79899 Other long term (current) drug therapy: Secondary | ICD-10-CM

## 2018-05-21 DIAGNOSIS — I480 Paroxysmal atrial fibrillation: Secondary | ICD-10-CM | POA: Diagnosis not present

## 2018-05-21 DIAGNOSIS — N189 Chronic kidney disease, unspecified: Secondary | ICD-10-CM | POA: Diagnosis not present

## 2018-05-21 DIAGNOSIS — D500 Iron deficiency anemia secondary to blood loss (chronic): Secondary | ICD-10-CM | POA: Diagnosis not present

## 2018-05-21 DIAGNOSIS — D649 Anemia, unspecified: Secondary | ICD-10-CM

## 2018-05-21 DIAGNOSIS — D508 Other iron deficiency anemias: Secondary | ICD-10-CM

## 2018-05-21 DIAGNOSIS — Z7902 Long term (current) use of antithrombotics/antiplatelets: Secondary | ICD-10-CM | POA: Diagnosis not present

## 2018-05-21 DIAGNOSIS — Z87891 Personal history of nicotine dependence: Secondary | ICD-10-CM

## 2018-05-21 DIAGNOSIS — D509 Iron deficiency anemia, unspecified: Secondary | ICD-10-CM | POA: Diagnosis present

## 2018-05-21 DIAGNOSIS — K922 Gastrointestinal hemorrhage, unspecified: Secondary | ICD-10-CM | POA: Diagnosis present

## 2018-05-21 DIAGNOSIS — E1122 Type 2 diabetes mellitus with diabetic chronic kidney disease: Secondary | ICD-10-CM | POA: Diagnosis present

## 2018-05-21 DIAGNOSIS — R042 Hemoptysis: Secondary | ICD-10-CM

## 2018-05-21 DIAGNOSIS — J438 Other emphysema: Secondary | ICD-10-CM | POA: Diagnosis not present

## 2018-05-21 DIAGNOSIS — I5032 Chronic diastolic (congestive) heart failure: Secondary | ICD-10-CM | POA: Diagnosis present

## 2018-05-21 DIAGNOSIS — Z888 Allergy status to other drugs, medicaments and biological substances status: Secondary | ICD-10-CM | POA: Diagnosis not present

## 2018-05-21 DIAGNOSIS — Z743 Need for continuous supervision: Secondary | ICD-10-CM | POA: Diagnosis not present

## 2018-05-21 DIAGNOSIS — Z881 Allergy status to other antibiotic agents status: Secondary | ICD-10-CM | POA: Diagnosis not present

## 2018-05-21 DIAGNOSIS — J439 Emphysema, unspecified: Secondary | ICD-10-CM | POA: Diagnosis present

## 2018-05-21 LAB — CBC WITH DIFFERENTIAL (CANCER CENTER ONLY)
BASOS PCT: 0 %
Basophils Absolute: 0 10*3/uL (ref 0.0–0.1)
Eosinophils Absolute: 0.1 10*3/uL (ref 0.0–0.5)
Eosinophils Relative: 1 %
HEMATOCRIT: 25.7 % — AB (ref 34.8–46.6)
Hemoglobin: 7.3 g/dL — ABNORMAL LOW (ref 11.6–15.9)
LYMPHS ABS: 0.4 10*3/uL — AB (ref 0.9–3.3)
Lymphocytes Relative: 5 %
MCH: 30.5 pg (ref 26.0–34.0)
MCHC: 28.4 g/dL — AB (ref 32.0–36.0)
MCV: 107.5 fL — AB (ref 81.0–101.0)
MONO ABS: 0.6 10*3/uL (ref 0.1–0.9)
MONOS PCT: 7 %
NEUTROS ABS: 8 10*3/uL — AB (ref 1.5–6.5)
Neutrophils Relative %: 87 %
Platelet Count: 218 10*3/uL (ref 145–400)
RBC: 2.39 MIL/uL — ABNORMAL LOW (ref 3.70–5.32)
RDW: 17.8 % — AB (ref 11.1–15.7)
WBC Count: 9.1 10*3/uL (ref 3.9–10.0)

## 2018-05-21 LAB — CMP (CANCER CENTER ONLY)
ALT: 18 U/L (ref 10–47)
ANION GAP: 16 — AB (ref 5–15)
AST: 23 U/L (ref 11–38)
Albumin: 3.3 g/dL — ABNORMAL LOW (ref 3.5–5.0)
Alkaline Phosphatase: 63 U/L (ref 26–84)
BILIRUBIN TOTAL: 0.6 mg/dL (ref 0.2–1.6)
BUN: 39 mg/dL — ABNORMAL HIGH (ref 7–22)
CHLORIDE: 95 mmol/L — AB (ref 98–108)
CO2: 34 mmol/L — AB (ref 18–33)
Calcium: 9.2 mg/dL (ref 8.0–10.3)
Creatinine: 2 mg/dL — ABNORMAL HIGH (ref 0.60–1.20)
Glucose, Bld: 84 mg/dL (ref 73–118)
POTASSIUM: 4.2 mmol/L (ref 3.3–4.7)
Sodium: 145 mmol/L (ref 128–145)
Total Protein: 6.8 g/dL (ref 6.4–8.1)

## 2018-05-21 LAB — GLUCOSE, CAPILLARY
Glucose-Capillary: 76 mg/dL (ref 70–99)
Glucose-Capillary: 120 mg/dL — ABNORMAL HIGH (ref 70–99)

## 2018-05-21 LAB — CBC
HCT: 25.6 % — ABNORMAL LOW (ref 36.0–46.0)
Hemoglobin: 7.5 g/dL — ABNORMAL LOW (ref 12.0–15.0)
MCH: 30.7 pg (ref 26.0–34.0)
MCHC: 29.3 g/dL — ABNORMAL LOW (ref 30.0–36.0)
MCV: 104.9 fL — ABNORMAL HIGH (ref 78.0–100.0)
Platelets: 206 10*3/uL (ref 150–400)
RBC: 2.44 MIL/uL — ABNORMAL LOW (ref 3.87–5.11)
RDW: 18 % — ABNORMAL HIGH (ref 11.5–15.5)
WBC: 7.9 10*3/uL (ref 4.0–10.5)

## 2018-05-21 LAB — TROPONIN I

## 2018-05-21 LAB — ABO/RH: ABO/RH(D): A POS

## 2018-05-21 LAB — RETICULOCYTES
RBC.: 2.45 MIL/uL — AB (ref 3.70–5.45)
RETIC COUNT ABSOLUTE: 147 10*3/uL — AB (ref 33.7–90.7)
RETIC CT PCT: 6 % — AB (ref 0.7–2.1)

## 2018-05-21 LAB — PROTIME-INR
INR: 1.8
Prothrombin Time: 20.8 seconds — ABNORMAL HIGH (ref 11.4–15.2)

## 2018-05-21 LAB — CBG MONITORING, ED: GLUCOSE-CAPILLARY: 95 mg/dL (ref 70–99)

## 2018-05-21 LAB — I-STAT CG4 LACTIC ACID, ED: Lactic Acid, Venous: 1.07 mmol/L (ref 0.5–1.9)

## 2018-05-21 LAB — PREPARE RBC (CROSSMATCH)

## 2018-05-21 MED ORDER — INSULIN ASPART 100 UNIT/ML ~~LOC~~ SOLN
0.0000 [IU] | Freq: Three times a day (TID) | SUBCUTANEOUS | Status: DC
  Administered 2018-05-22 – 2018-05-23 (×5): 3 [IU] via SUBCUTANEOUS
  Administered 2018-05-23: 7 [IU] via SUBCUTANEOUS

## 2018-05-21 MED ORDER — ALBUTEROL SULFATE (2.5 MG/3ML) 0.083% IN NEBU: 3.0000 mL | INHALATION_SOLUTION | Freq: Four times a day (QID) | RESPIRATORY_TRACT | Status: DC | PRN

## 2018-05-21 MED ORDER — SODIUM CHLORIDE 0.9 % IV SOLN
80.0000 mg | Freq: Once | INTRAVENOUS | Status: AC
  Administered 2018-05-21: 80 mg via INTRAVENOUS
  Filled 2018-05-21: qty 80

## 2018-05-21 MED ORDER — DARBEPOETIN ALFA 300 MCG/0.6ML IJ SOSY: 300.0000 ug | PREFILLED_SYRINGE | Freq: Once | INTRAMUSCULAR | Status: DC

## 2018-05-21 MED ORDER — SODIUM CHLORIDE 0.9% IV SOLUTION
Freq: Once | INTRAVENOUS | Status: AC
  Administered 2018-05-21: 23:00:00 via INTRAVENOUS

## 2018-05-21 MED ORDER — AMIODARONE HCL 200 MG PO TABS
200.0000 mg | ORAL_TABLET | Freq: Every day | ORAL | Status: DC
Start: 2018-05-22 — End: 2018-05-23
  Administered 2018-05-22 – 2018-05-23 (×2): 200 mg via ORAL
  Filled 2018-05-21 (×2): qty 1

## 2018-05-21 MED ORDER — ARFORMOTEROL TARTRATE 15 MCG/2ML IN NEBU
15.0000 ug | INHALATION_SOLUTION | Freq: Two times a day (BID) | RESPIRATORY_TRACT | Status: DC
  Administered 2018-05-22 – 2018-05-23 (×3): 15 ug via RESPIRATORY_TRACT
  Filled 2018-05-21 (×3): qty 2

## 2018-05-21 MED ORDER — DARBEPOETIN ALFA 300 MCG/0.6ML IJ SOSY
PREFILLED_SYRINGE | INTRAMUSCULAR | Status: AC
  Filled 2018-05-21: qty 0.6

## 2018-05-21 MED ORDER — UMECLIDINIUM BROMIDE 62.5 MCG/INH IN AEPB
1.0000 | INHALATION_SPRAY | Freq: Every day | RESPIRATORY_TRACT | Status: DC
  Administered 2018-05-22 – 2018-05-23 (×2): 1 via RESPIRATORY_TRACT
  Filled 2018-05-21: qty 7

## 2018-05-21 MED ORDER — INSULIN ASPART 100 UNIT/ML ~~LOC~~ SOLN: 0.0000 [IU] | Freq: Every day | SUBCUTANEOUS | Status: DC

## 2018-05-21 MED ORDER — SODIUM CHLORIDE 0.9 % IV SOLN
8.0000 mg/h | INTRAVENOUS | Status: DC
  Administered 2018-05-21 – 2018-05-22 (×2): 8 mg/h via INTRAVENOUS
  Filled 2018-05-21 (×4): qty 80

## 2018-05-21 MED ORDER — ALLOPURINOL 100 MG PO TABS
100.0000 mg | ORAL_TABLET | Freq: Every day | ORAL | Status: DC
Start: 2018-05-22 — End: 2018-05-23
  Administered 2018-05-22 – 2018-05-23 (×2): 100 mg via ORAL
  Filled 2018-05-21 (×2): qty 1

## 2018-05-21 MED ORDER — ACETAMINOPHEN 325 MG PO TABS: 325.0000 mg | ORAL_TABLET | Freq: Every day | ORAL | Status: DC | PRN

## 2018-05-21 MED ORDER — DILTIAZEM HCL ER COATED BEADS 180 MG PO CP24
180.0000 mg | ORAL_CAPSULE | Freq: Every day | ORAL | Status: DC
  Administered 2018-05-22 – 2018-05-23 (×2): 180 mg via ORAL
  Filled 2018-05-21 (×2): qty 1

## 2018-05-21 MED ORDER — EZETIMIBE-SIMVASTATIN 10-40 MG PO TABS
1.0000 | ORAL_TABLET | Freq: Every day | ORAL | Status: DC
  Administered 2018-05-21 – 2018-05-22 (×2): 1 via ORAL
  Filled 2018-05-21 (×3): qty 1

## 2018-05-21 MED ORDER — DARBEPOETIN ALFA 500 MCG/ML IJ SOSY
PREFILLED_SYRINGE | INTRAMUSCULAR | Status: AC
  Filled 2018-05-21: qty 1

## 2018-05-21 MED ORDER — PANTOPRAZOLE SODIUM 40 MG IV SOLR: 40.0000 mg | Freq: Two times a day (BID) | INTRAVENOUS | Status: DC

## 2018-05-21 MED ORDER — METOPROLOL TARTRATE 50 MG PO TABS
50.0000 mg | ORAL_TABLET | Freq: Two times a day (BID) | ORAL | Status: DC
  Administered 2018-05-22 – 2018-05-23 (×3): 50 mg via ORAL
  Filled 2018-05-21 (×4): qty 1

## 2018-05-21 NOTE — Patient Instructions (Signed)
Darbepoetin Alfa injection (Aranesp) What is this medicine? DARBEPOETIN ALFA (dar be POE e tin AL fa) helps your body make more red blood cells. It is used to treat anemia caused by chronic kidney failure and chemotherapy. This medicine may be used for other purposes; ask your health care provider or pharmacist if you have questions. COMMON BRAND NAME(S): Aranesp What should I tell my health care provider before I take this medicine? They need to know if you have any of these conditions: -blood clotting disorders or history of blood clots -cancer patient not on chemotherapy -cystic fibrosis -heart disease, such as angina, heart failure, or a history of a heart attack -hemoglobin level of 12 g/dL or greater -high blood pressure -low levels of folate, iron, or vitamin B12 -seizures -an unusual or allergic reaction to darbepoetin, erythropoietin, albumin, hamster proteins, latex, other medicines, foods, dyes, or preservatives -pregnant or trying to get pregnant -breast-feeding How should I use this medicine? This medicine is for injection into a vein or under the skin. It is usually given by a health care professional in a hospital or clinic setting. If you get this medicine at home, you will be taught how to prepare and give this medicine. Use exactly as directed. Take your medicine at regular intervals. Do not take your medicine more often than directed. It is important that you put your used needles and syringes in a special sharps container. Do not put them in a trash can. If you do not have a sharps container, call your pharmacist or healthcare provider to get one. A special MedGuide will be given to you by the pharmacist with each prescription and refill. Be sure to read this information carefully each time. Talk to your pediatrician regarding the use of this medicine in children. While this medicine may be used in children as young as 1 year for selected conditions, precautions do  apply. Overdosage: If you think you have taken too much of this medicine contact a poison control center or emergency room at once. NOTE: This medicine is only for you. Do not share this medicine with others. What if I miss a dose? If you miss a dose, take it as soon as you can. If it is almost time for your next dose, take only that dose. Do not take double or extra doses. What may interact with this medicine? Do not take this medicine with any of the following medications: -epoetin alfa This list may not describe all possible interactions. Give your health care provider a list of all the medicines, herbs, non-prescription drugs, or dietary supplements you use. Also tell them if you smoke, drink alcohol, or use illegal drugs. Some items may interact with your medicine. What should I watch for while using this medicine? Your condition will be monitored carefully while you are receiving this medicine. You may need blood work done while you are taking this medicine. What side effects may I notice from receiving this medicine? Side effects that you should report to your doctor or health care professional as soon as possible: -allergic reactions like skin rash, itching or hives, swelling of the face, lips, or tongue -breathing problems -changes in vision -chest pain -confusion, trouble speaking or understanding -feeling faint or lightheaded, falls -high blood pressure -muscle aches or pains -pain, swelling, warmth in the leg -rapid weight gain -severe headaches -sudden numbness or weakness of the face, arm or leg -trouble walking, dizziness, loss of balance or coordination -seizures (convulsions) -swelling of the ankles, feet,   hands -unusually weak or tired Side effects that usually do not require medical attention (report to your doctor or health care professional if they continue or are bothersome): -diarrhea -fever, chills (flu-like symptoms) -headaches -nausea, vomiting -redness,  stinging, or swelling at site where injected This list may not describe all possible side effects. Call your doctor for medical advice about side effects. You may report side effects to FDA at 1-800-FDA-1088. Where should I keep my medicine? Keep out of the reach of children. Store in a refrigerator between 2 and 8 degrees C (36 and 46 degrees F). Do not freeze. Do not shake. Throw away any unused portion if using a single-dose vial. Throw away any unused medicine after the expiration date. NOTE: This sheet is a summary. It may not cover all possible information. If you have questions about this medicine, talk to your doctor, pharmacist, or health care provider.  2018 Elsevier/Gold Standard (2016-07-03 19:52:26)  

## 2018-05-21 NOTE — ED Triage Notes (Signed)
Pt sent here from PMD office for eval of rectal bleeding

## 2018-05-21 NOTE — Progress Notes (Signed)
Pharmacist-Provider Communication:  Consult received to continue [employer] inhaler regimen inpatient. Formulary substitution = arformoteral 10 mcg BID + Incruse daily. Pt ok with substitution, orders entered.   Lenis Noon, PharmD 05/21/18 5:04 PM

## 2018-05-21 NOTE — Progress Notes (Addendum)
72 year old female with multiple medical problems, including multifactorial anemia,( iron deficiency/ anemia chronic disease), COPD with chronic hypoxic respiratory failure, type 2 diabetes mellitus, paroxysmal atrial fibrillation on anticoagulation (apixaban).  She was found to be anemic at the oncology outpatient clinic (Dr. Marin Olp) positive melena.  She was referred to the emergency room for further evaluation.  Vital sounds are stable with a temperature 98.3, blood pressure 118/53, heart rate 66, respiratory rate 24, oxygen saturation 100% on 2 L per nasal cannula.  Her hemoglobin is 7.3 down from 7.6 in May 28.   Patient will be admitted to the hospital with the working diagnosis of symptomatic anemia related to gastrointestinal bleed.  Will need gastroenterology consultation on arrival, for evaluation for endoscopic procedure.

## 2018-05-21 NOTE — Progress Notes (Signed)
Hematology and Oncology Follow Up Visit  Paige Hardin [id number] [birth year] 72 y.o. 05/21/2018   Principle Diagnosis:  Anemia secondary to iron deficiency  Anemia secondary to renal insufficiency  Chronic anticoagulation for paroxysmal atrial fibrillation  Current Therapy:   IV iron as indicated - last received in October 2018 Aranesp 300 mcg subcutaneous as needed for hemoglobin less than 11  ELIQUIS - life long   Interim History:  Paige Hardin is here today with Paige Hardin for follow-up.  Unfortunately, I think something is going on.  Paige hemoglobin today is 7.3.  We saw Paige back in late May and Paige hemoglobin was 7.6.  The fact that Paige MCV is rising, I think is indicative of something going on.  The fact that Paige reticulocyte count is starting to go up is also a sign of something going on.  He says that she has not noted any bleeding.  She says that there is not been any hematochezia or obvious melena.  She is at the assisted living facility.  She is on chronic oxygen.  She has had no change in Paige medications.  She is still on Eliquis.  We gave Paige iron the last time she was here.  Paige iron studies back in late May showed a ferritin of 341 with iron saturation of 20%.  She is been getting Aranesp.  Paige erythropoietin level is 29.  I did do a rectal exam on Paige.  Paige stool was very dark and heme positive.  Paige appetite has been doing fairly well.  She is had no nausea or vomiting.  Para she is had no cardiac issues.  Overall, Paige performance status is ECOG 2.   Medications:  Allergies as of 05/21/2018      Reactions   Ace Inhibitors Cough   Clindamycin Rash      Medication List        Accurate as of 05/21/18 12:32 PM. Always use your most recent med list.          acetaminophen 325 MG tablet Commonly known as:  TYLENOL Take 325 mg by mouth daily as needed for mild pain.   albuterol (2.5 MG/3ML) 0.083% nebulizer solution Commonly known as:  PROVENTIL Take 2.5  mg by nebulization every 6 (six) hours as needed for wheezing or shortness of breath.   albuterol 108 (90 Base) MCG/ACT inhaler Commonly known as:  PROVENTIL HFA;VENTOLIN HFA Inhale 2 puffs into the lungs every 6 (six) hours as needed for wheezing or shortness of breath.   allopurinol 100 MG tablet Commonly known as:  ZYLOPRIM Take 1 tablet (100 mg total) by mouth daily.   amiodarone 200 MG tablet Commonly known as:  PACERONE Take 1 tablet (200 mg total) by mouth daily.   apixaban 5 MG Tabs tablet Commonly known as:  ELIQUIS Take 1 tablet (5 mg total) by mouth 2 (two) times daily.   calcium-vitamin D 500-200 MG-UNIT tablet Commonly known as:  OSCAL WITH D Take 1 tablet by mouth daily with breakfast.   DIABETIC TUSSIN EX 100 MG/5ML liquid Generic drug:  guaiFENesin Take 200 mg by mouth 3 (three) times daily as needed for cough.   diltiazem 180 MG 24 hr capsule Commonly known as:  CARDIZEM CD Take 1 capsule (180 mg total) by mouth daily.   ezetimibe-simvastatin 10-40 MG tablet Commonly known as:  VYTORIN Take 1 tablet by mouth at bedtime.   ferrous sulfate 325 (65 FE) MG tablet Take 325 mg by mouth 2 (  two) times daily with a meal.   furosemide 80 MG tablet Commonly known as:  LASIX Take 2 tablets (160 mg total) by mouth 2 (two) times daily. MAKE SURE TO TAKE AT 8 AM AND 2 PM   insulin aspart 100 UNIT/ML injection Commonly known as:  novoLOG Inject 0-9 Units into the skin 3 (three) times daily with meals. Correction factor Sliding scale CBG 70 - 120: 0 units CBG 121 - 150: 1 unit,  CBG 151 - 200: 2 units,  CBG 201 - 250: 3 units,  CBG 251 - 300: 5 units,  CBG 301 - 350: 7 units,  CBG 351 - 400: 9 units   CBG > 400: 9 units and notify your MD   insulin NPH Human 100 UNIT/ML injection Commonly known as:  HUMULIN N,NOVOLIN N Inject 20 Units into the skin 2 (two) times daily before a meal.   metolazone 2.5 MG tablet Commonly known as:  ZAROXOLYN Every other Wednesday     metoprolol tartrate 50 MG tablet Commonly known as:  LOPRESSOR Take 1 tablet (50 mg total) by mouth 2 (two) times daily.   MULTIVITAMIN/IRON PO Take 1 tablet by mouth daily.   omeprazole 20 MG capsule Commonly known as:  PRILOSEC Take 20 mg by mouth daily.   ondansetron 4 MG tablet Commonly known as:  ZOFRAN   OXYGEN Inhale 2 L into the lungs continuous.   potassium chloride SA 20 MEQ tablet Commonly known as:  K-DUR,KLOR-CON Take 20 mEq by mouth 3 (three) times daily. MAKE SURE TO TAKE EXTRA 20 MEQ ON METOLAZONE DAYS   Tiotropium Bromide-Olodaterol 2.5-2.5 MCG/ACT Aers Commonly known as:  STIOLTO RESPIMAT Inhale 2 puffs into the lungs daily.   vitamin B-12 500 MCG tablet Commonly known as:  CYANOCOBALAMIN Take 500 mcg by mouth daily.   Vitamin C 500 MG Caps Take 500 mg by mouth daily.   Vitamin D 2000 units tablet Take 2,000 Units by mouth daily.       Allergies:  Allergies  Allergen Reactions  . Ace Inhibitors Cough  . Clindamycin Rash    Past Medical History, Surgical history, Social history, and Family History were reviewed and updated.  Review of Systems: Review of Systems  Constitutional: Positive for malaise/fatigue.  HENT: Negative.   Eyes: Negative.   Respiratory: Positive for shortness of breath.   Cardiovascular: Positive for palpitations and leg swelling.  Gastrointestinal: Positive for diarrhea.  Genitourinary: Negative.   Musculoskeletal: Positive for joint pain and myalgias.  Skin: Negative.   Neurological: Negative.   Endo/Heme/Allergies: Bruises/bleeds easily.  Psychiatric/Behavioral: Negative.      Physical Exam:  weight is 304 lb 2 oz (138 kg) (abnormal). Paige temperature is 98.2 F (36.8 C). Paige blood pressure is 106/39 (abnormal) and Paige pulse is 56 (abnormal). Paige respiration is 18 and oxygen saturation is 100%.   Wt Readings from Last 3 Encounters:  05/21/18 (!) 304 lb 2 oz (138 kg)  04/23/18 298 lb 8 oz (135.4 kg)   02/19/18 (!) 309 lb (140.2 kg)    Physical Exam  Constitutional: She is oriented to person, place, and time.  HENT:  Head: Normocephalic and atraumatic.  Mouth/Throat: Oropharynx is clear and moist.  Eyes: Pupils are equal, round, and reactive to light. EOM are normal.  Neck: Normal range of motion.  Cardiovascular: Normal rate, regular rhythm and normal heart sounds.  Pulmonary/Chest: Effort normal and breath sounds normal.  Abdominal: Soft. Bowel sounds are normal.  Genitourinary: Rectal exam shows guaiac  positive stool.  Musculoskeletal: Normal range of motion. She exhibits no edema, tenderness or deformity.  Lymphadenopathy:    She has no cervical adenopathy.  Neurological: She is alert and oriented to person, place, and time.  Skin: Skin is warm and dry. No rash noted. No erythema.  Psychiatric: She has a normal mood and affect. Paige behavior is normal. Judgment and thought content normal.  Vitals reviewed.    Lab Results  Component Value Date   WBC 9.1 05/21/2018   HGB 7.3 (L) 05/21/2018   HCT 25.7 (L) 05/21/2018   MCV 107.5 (H) 05/21/2018   PLT 218 05/21/2018   Lab Results  Component Value Date   FERRITIN 341 (H) 04/23/2018   IRON 50 04/23/2018   TIBC 255 04/23/2018   UIBC 205 04/23/2018   IRONPCTSAT 20 (L) 04/23/2018   Lab Results  Component Value Date   RETICCTPCT 5.5 (H) 04/23/2018   RBC 2.39 (L) 05/21/2018   RETICCTABS 89.0 09/21/2015   No results found for: KPAFRELGTCHN, LAMBDASER, KAPLAMBRATIO No results found for: IGGSERUM, IGA, IGMSERUM No results found for: Odetta Pink, SPEI   Chemistry      Component Value Date/Time   NA 145 05/21/2018 1139   NA 141 10/16/2017 1106   NA 140 07/31/2017 1028   K 4.2 05/21/2018 1139   K 3.8 10/16/2017 1106   K 4.2 07/31/2017 1028   CL 95 (L) 05/21/2018 1139   CL 92 (L) 10/16/2017 1106   CO2 34 (H) 05/21/2018 1139   CO2 31 10/16/2017 1106   CO2 35 (H)  07/31/2017 1028   BUN 39 (H) 05/21/2018 1139   BUN 40 (H) 10/16/2017 1106   BUN 42.7 (H) 07/31/2017 1028   CREATININE 2.00 (H) 05/21/2018 1139   CREATININE 1.7 (H) 10/16/2017 1106   CREATININE 1.5 (H) 07/31/2017 1028      Component Value Date/Time   CALCIUM 9.2 05/21/2018 1139   CALCIUM 9.5 10/16/2017 1106   CALCIUM 10.1 07/31/2017 1028   ALKPHOS 63 05/21/2018 1139   ALKPHOS 68 10/16/2017 1106   ALKPHOS 69 07/31/2017 1028   AST 23 05/21/2018 1139   AST 14 07/31/2017 1028   ALT 18 05/21/2018 1139   ALT 15 10/16/2017 1106   ALT 14 07/31/2017 1028   BILITOT 0.6 05/21/2018 1139   BILITOT 0.54 07/31/2017 1028      Impression and Plan: Ms. Claros is a very pleasant 72 yo caucasian female with both iron deficeincy anemia and anemia of chronic renal failure.   Unfortunately, she will have to be admitted.  I think she probably will need to be transfused with blood.  She is on Eliquis.  She probably is going need to have upper and lower endoscopy.  This is definitely a difficult situation with all of Paige health issues.  I called the emergency room.  They will take Paige and make sure everything is stable and then trans-port Paige for admission.  I will likely see Paige in the hospital.         Volanda Napoleon, MD 6/25/201912:32 PM

## 2018-05-21 NOTE — H&P (Signed)
History and Physical    Paige Hardin RXV:[id number] DOB: 03/09/1946 DOA: 05/21/2018  PCP: Reymundo Poll, MD  Patient coming from: Home  I have personally briefly reviewed patient's old medical records in [place]  Chief Complaint: dark stools  HPI: Paige Hardin is a 72 y.o. female with medical history significant of CKD, atrial fibrillation on Eliquis, COPD and past medical history listed below.  Patient follows with Dr. Annitta Jersey as an outpatient.  Reportedly was told to go to the emergency department after was found to have low hemoglobin levels.  While in the emergency department was found to have hemoglobin 7.3.  The patient does not really have any complaints of bright red blood per rectum and reports that her stools have been black because she is on iron supplementation.  ED Course: Pt found to have a hgb of 7.3 and we were consulted for further evaluation and recommendations.   Review of Systems: As per HPI otherwise 10 point review of systems negative.    Past Medical History:  Diagnosis Date  . Arthritis    "joints" (01/14/2014)  . Carotid atherosclerosis   . Chronic low back pain   . COPD (chronic obstructive pulmonary disease) ([place])   . Depression    "maybe" (01/14/2014  . Diastolic congestive heart failure ([place])   . Emphysema   . Excessive daytime sleepiness 09/08/2015  . GERD (gastroesophageal reflux disease)   . Hoarseness, chronic   . Hyperlipidemia   . Lung nodules   . Obese   . Paroxysmal atrial fibrillation ([facility])    a. failed flecainide;  b. 03/2014 amio started->DCCV;  c. Chronic pradaxa.  . Pneumonia 1990's   "once"  . Type 2 diabetes mellitus ([place])     Past Surgical History:  Procedure Laterality Date  . BREAST BIOPSY Left ~ 1980   "solid"  . BREAST LUMPECTOMY Left ~ 1980   "removed a mass; it was benign" (01/14/2014)  . CARDIOVERSION N/A 03/11/2014   Procedure: CARDIOVERSION;  Surgeon: Sanda Klein, MD;  Location: [facility];  Service:  Cardiovascular;  Laterality: N/A;  . CARDIOVERSION N/A 03/27/2014   Procedure: CARDIOVERSION;  Surgeon: Darlin Coco, MD;  Location: [facility];  Service: Cardiovascular;  Laterality: N/A;  . COLONOSCOPY  01/26/2012   Procedure: COLONOSCOPY;  Surgeon: Beryle Beams, MD;  Location: [facility];  Service: Endoscopy;  Laterality: N/A;  . ESOPHAGOGASTRODUODENOSCOPY  01/26/2012   Procedure: ESOPHAGOGASTRODUODENOSCOPY (EGD);  Surgeon: Beryle Beams, MD;  Location: [facility];  Service: Endoscopy;  Laterality: N/A;  . TOTAL ABDOMINAL HYSTERECTOMY  1994     reports that she quit smoking about 9 years ago. Her smoking use included cigarettes. She started smoking about 54 years ago. She has a 135.00 pack-year smoking history. She has never used smokeless tobacco. She reports that she drinks alcohol. She reports that she does not use drugs.  Allergies  Allergen Reactions  . Ace Inhibitors Cough  . Clindamycin Rash    Family History  Problem Relation Age of Onset  . Breast cancer Sister   . Heart disease Unknown   . Hodgkin's lymphoma Unknown   . Asthma Mother     Prior to Admission medications   Medication Sig Start Date End Date Taking? Authorizing Provider  allopurinol (ZYLOPRIM) 100 MG tablet Take 1 tablet (100 mg total) by mouth daily. 10/16/17  Yes [place], Holli Humbles, NP  amiodarone (PACERONE) 200 MG tablet Take 1 tablet (200 mg total) by mouth daily. 04/23/14  Yes Kathlen Mody,  Scott T, PA-C  apixaban (ELIQUIS) 5 MG TABS tablet Take 1 tablet (5 mg total) by mouth 2 (two) times daily. 04/03/14  Yes [place], Grace Bushy, NP  Ascorbic Acid (VITAMIN C) 500 MG CAPS Take 500 mg by mouth daily.    Yes [provider]  calcium-vitamin D (OSCAL WITH D) 500-200 MG-UNIT per tablet Take 1 tablet by mouth daily with breakfast.   Yes [provider]  Cholecalciferol (VITAMIN D) 2000 UNITS tablet Take 2,000 Units by mouth daily.   Yes [provider]  diltiazem (CARDIZEM CD) 180 MG 24 hr  capsule Take 1 capsule (180 mg total) by mouth daily. 03/12/14  Yes Edmisten, Azzie Roup, PA-C  ezetimibe-simvastatin (VYTORIN) 10-40 MG per tablet Take 1 tablet by mouth at bedtime.    Yes [provider]  furosemide (LASIX) 80 MG tablet Take 2 tablets (160 mg total) by mouth 2 (two) times daily. MAKE SURE TO TAKE AT 8 AM AND 2 PM 04/23/14  Yes Kathlen Mody, Scott T, PA-C  insulin NPH Human (HUMULIN N,NOVOLIN N) 100 UNIT/ML injection Inject 20 Units into the skin 2 (two) times daily before a meal.  04/04/16  Yes [provider]  metolazone (ZAROXOLYN) 2.5 MG tablet Every other Wednesday 01/04/17  Yes Larey Dresser, MD  metoprolol (LOPRESSOR) 50 MG tablet Take 1 tablet (50 mg total) by mouth 2 (two) times daily. 03/12/14  Yes Edmisten, Azzie Roup, PA-C  Multiple Vitamins-Iron (MULTIVITAMIN/IRON PO) Take 1 tablet by mouth daily.   Yes [provider]  omeprazole (PRILOSEC) 20 MG capsule Take 20 mg by mouth daily.   Yes [provider]  potassium chloride SA (K-DUR,KLOR-CON) 20 MEQ tablet Take 20 mEq by mouth 3 (three) times daily. MAKE SURE TO TAKE EXTRA 20 MEQ ON METOLAZONE DAYS 04/23/14  Yes Richardson Dopp T, PA-C  Tiotropium Bromide-Olodaterol (STIOLTO RESPIMAT) 2.5-2.5 MCG/ACT AERS Inhale 2 puffs into the lungs daily. 10/26/17  Yes Collene Gobble, MD  vitamin B-12 (CYANOCOBALAMIN) 500 MCG tablet Take 500 mcg by mouth daily.   Yes [provider]  acetaminophen (TYLENOL) 325 MG tablet Take 325 mg by mouth daily as needed for mild pain.     [provider]  albuterol (PROVENTIL HFA;VENTOLIN HFA) 108 (90 Base) MCG/ACT inhaler Inhale 2 puffs into the lungs every 6 (six) hours as needed for wheezing or shortness of breath.    [provider]  albuterol (PROVENTIL) (2.5 MG/3ML) 0.083% nebulizer solution Take 2.5 mg by nebulization every 6 (six) hours as needed for wheezing or shortness of breath.    [provider]  ferrous sulfate 325 (65 FE) MG  tablet Take 325 mg by mouth 2 (two) times daily with a meal.     [provider]  guaiFENesin (DIABETIC TUSSIN EX) 100 MG/5ML liquid Take 200 mg by mouth 3 (three) times daily as needed for cough.    [provider]  insulin aspart (NOVOLOG) 100 UNIT/ML injection Inject 0-9 Units into the skin 3 (three) times daily with meals. Correction factor Sliding scale CBG 70 - 120: 0 units CBG 121 - 150: 1 unit,  CBG 151 - 200: 2 units,  CBG 201 - 250: 3 units,  CBG 251 - 300: 5 units,  CBG 301 - 350: 7 units,  CBG 351 - 400: 9 units   CBG > 400: 9 units and notify your MD 11/19/15   Rai, Vernelle Emerald, MD  ondansetron (ZOFRAN) 4 MG tablet every 8 (eight) hours as needed.  12/04/17   [provider]  OXYGEN Inhale 2 L into the lungs continuous.    [provider]    Physical Exam: Vitals:   05/21/18 1430 05/21/18 1500 05/21/18 1606 05/21/18 1608  BP: (!) 125/56 (!) 129/54  (!) 130/37  Pulse: 64 62  60  Resp: 15 15  16   Temp:   98.4 F (36.9 C)   TempSrc:   Oral   SpO2: 92% 92%  93%  Weight:      Height:        Constitutional: NAD, calm, comfortable Vitals:   05/21/18 1430 05/21/18 1500 05/21/18 1606 05/21/18 1608  BP: (!) 125/56 (!) 129/54  (!) 130/37  Pulse: 64 62  60  Resp: 15 15  16   Temp:   98.4 F (36.9 C)   TempSrc:   Oral   SpO2: 92% 92%  93%  Weight:      Height:       Eyes: PERRL, lids and conjunctivae normal ENMT: Mucous membranes are moist. Posterior pharynx clear of any exudate or lesions. Neck: normal, supple, no masses, no thyromegaly Respiratory: clear to auscultation bilaterally, no wheezing, no crackles.  Cardiovascular: Irregular rate and rhythm, no murmurs / rubs / gallops. + lower extremity edema Abdomen: no tenderness, no masses palpated. No hepatosplenomegaly. Bowel sounds positive.  Musculoskeletal: no clubbing / cyanosis. No joint deformity upper and lower extremities. Good ROM, no contractures. Normal muscle tone.  Skin: no  rashes, lesions, ulcers. No induration Neurologic:  No facial asymmetry, answers questions appropriately, hard of hearing. Psychiatric: Normal judgment and insight. Alert and oriented x 3. Normal mood.    Labs on Admission: I have personally reviewed following labs and imaging studies  CBC: Recent Labs  Lab 05/21/18 1139  WBC 9.1  NEUTROABS 8.0*  HGB 7.3*  HCT 25.7*  MCV 107.5*  PLT 762   Basic Metabolic Panel: Recent Labs  Lab 05/21/18 1139  NA 145  K 4.2  CL 95*  CO2 34*  GLUCOSE 84  BUN 39*  CREATININE 2.00*  CALCIUM 9.2   GFR: Estimated Creatinine Clearance: 36.2 mL/min (A) (by C-G formula based on SCr of 2 mg/dL (H)). Liver Function Tests: Recent Labs  Lab 05/21/18 1139  AST 23  ALT 18  ALKPHOS 63  BILITOT 0.6  PROT 6.8  ALBUMIN 3.3*   No results for input(s): LIPASE, AMYLASE in the last 168 hours. No results for input(s): AMMONIA in the last 168 hours. Coagulation Profile: Recent Labs  Lab 05/21/18 1329  INR 1.80   Cardiac Enzymes: Recent Labs  Lab 05/21/18 1329  TROPONINI <0.03   BNP (last 3 results) No results for input(s): PROBNP in the last 8760 hours. HbA1C: No results for input(s): HGBA1C in the last 72 hours. CBG: Recent Labs  Lab 05/21/18 1515  GLUCAP 95   Lipid Profile: No results for input(s): CHOL, HDL, LDLCALC, TRIG, CHOLHDL, LDLDIRECT in the last 72 hours. Thyroid Function Tests: No results for input(s): TSH, T4TOTAL, FREET4, T3FREE, THYROIDAB in the last 72 hours. Anemia Panel: Recent Labs    05/21/18 1139  RETICCTPCT 6.0*   Urine analysis:    Component Value Date/Time   COLORURINE YELLOW 10/17/2016 [place] 10/17/2016 1608   LABSPEC 1.008 10/17/2016 1608   PHURINE 6.0 10/17/2016 1608   GLUCOSEU NEGATIVE 10/17/2016 1608   HGBUR MODERATE (A) 10/17/2016 1608   BILIRUBINUR NEGATIVE 10/17/2016 1608   KETONESUR NEGATIVE 10/17/2016 1608   PROTEINUR NEGATIVE 10/17/2016 1608  UROBILINOGEN 1.0  03/23/2014 1915   NITRITE POSITIVE (A) 10/17/2016 1608   LEUKOCYTESUR MODERATE (A) 10/17/2016 1608    Radiological Exams on Admission: No results found.  EKG: Independently reviewed. afib rate controlled.  Assessment/Plan Active Problems:    GI bleeding - Hold Eliquis - Consult GI in am - NPO after midnight - transfuse 1 unit of blood to keep hgb levels above 8.0 given history of heart disease.  - protonix gtt and bolus    COPD with emphysema  - continue home maintenance medication regimen and albuterol    PAF (paroxysmal atrial fibrillation) s/p multiple DCCVs; on Amiodarone - continue prior to admission rate controlling agents - hold eliquis  Diastolic HF - Will hold lasix on admission given profound anemia at 7.2. May be continued next am with improvement in condition after blood transfusion  CKD stage 3 - continue to monitor Serum creatinine    Poorly controlled diabetes mellitus ([place]) - clear liquid diet    Iron deficiency anemia - continue iron supplementation.   DVT prophylaxis: scd's Code Status: dnr Family Communication: d/c patient and family member at bedside. Disposition Plan: pending further work up. Consults called: none Admission status: inp   Velvet Bathe MD [facility] Pager [phone]  If 7PM-7AM, please contact night-coverage [email or web address] Password [facility]  05/21/2018, 5:04 PM

## 2018-05-21 NOTE — Progress Notes (Signed)
Hold Aranesp injection today per Dr. Marin Olp.

## 2018-05-22 DIAGNOSIS — E1165 Type 2 diabetes mellitus with hyperglycemia: Secondary | ICD-10-CM

## 2018-05-22 DIAGNOSIS — D500 Iron deficiency anemia secondary to blood loss (chronic): Secondary | ICD-10-CM

## 2018-05-22 DIAGNOSIS — I480 Paroxysmal atrial fibrillation: Secondary | ICD-10-CM

## 2018-05-22 DIAGNOSIS — J438 Other emphysema: Secondary | ICD-10-CM

## 2018-05-22 DIAGNOSIS — K2971 Gastritis, unspecified, with bleeding: Secondary | ICD-10-CM

## 2018-05-22 LAB — CBC
HCT: 27.3 % — ABNORMAL LOW (ref 36.0–46.0)
HEMATOCRIT: 26.5 % — AB (ref 36.0–46.0)
HEMOGLOBIN: 7.7 g/dL — AB (ref 12.0–15.0)
Hemoglobin: 8.1 g/dL — ABNORMAL LOW (ref 12.0–15.0)
MCH: 30.8 pg (ref 26.0–34.0)
MCH: 30.3 pg (ref 26.0–34.0)
MCHC: 29.7 g/dL — AB (ref 30.0–36.0)
MCHC: 29.1 g/dL — ABNORMAL LOW (ref 30.0–36.0)
MCV: 103.8 fL — AB (ref 78.0–100.0)
MCV: 104.3 fL — AB (ref 78.0–100.0)
PLATELETS: 181 10*3/uL (ref 150–400)
Platelets: 186 10*3/uL (ref 150–400)
RBC: 2.63 MIL/uL — ABNORMAL LOW (ref 3.87–5.11)
RBC: 2.54 MIL/uL — AB (ref 3.87–5.11)
RDW: 18.6 % — AB (ref 11.5–15.5)
RDW: 18.4 % — ABNORMAL HIGH (ref 11.5–15.5)
WBC: 7.7 10*3/uL (ref 4.0–10.5)
WBC: 7.7 10*3/uL (ref 4.0–10.5)

## 2018-05-22 LAB — PREPARE RBC (CROSSMATCH)

## 2018-05-22 LAB — GLUCOSE, CAPILLARY
GLUCOSE-CAPILLARY: 127 mg/dL — AB (ref 70–99)
GLUCOSE-CAPILLARY: 126 mg/dL — AB (ref 70–99)
Glucose-Capillary: 130 mg/dL — ABNORMAL HIGH (ref 70–99)

## 2018-05-22 LAB — IRON AND TIBC
IRON: 40 ug/dL — AB (ref 41–142)
Saturation Ratios: 15 % — ABNORMAL LOW (ref 21–57)
TIBC: 264 ug/dL (ref 236–444)
UIBC: 224 ug/dL

## 2018-05-22 LAB — OCCULT BLOOD X 1 CARD TO LAB, STOOL: Fecal Occult Bld: NEGATIVE

## 2018-05-22 LAB — FERRITIN: FERRITIN: 367 ng/mL — AB (ref 11–307)

## 2018-05-22 MED ORDER — PANTOPRAZOLE SODIUM 40 MG PO TBEC
40.0000 mg | DELAYED_RELEASE_TABLET | Freq: Two times a day (BID) | ORAL | Status: DC
  Administered 2018-05-22 – 2018-05-23 (×3): 40 mg via ORAL
  Filled 2018-05-22 (×3): qty 1

## 2018-05-22 MED ORDER — APIXABAN 5 MG PO TABS
5.0000 mg | ORAL_TABLET | Freq: Two times a day (BID) | ORAL | Status: DC
  Administered 2018-05-22 – 2018-05-23 (×3): 5 mg via ORAL
  Filled 2018-05-22 (×3): qty 1

## 2018-05-22 MED ORDER — SODIUM CHLORIDE 0.9% IV SOLUTION
Freq: Once | INTRAVENOUS | Status: AC
  Administered 2018-05-22: 18:00:00 via INTRAVENOUS

## 2018-05-22 NOTE — Progress Notes (Addendum)
PROGRESS NOTE    Paige Hardin  AQT:[id number] DOB: [birth year] DOA: 05/21/2018 PCP: Reymundo Poll, MD   Brief Narrative:   72 year old with a history of CKD, atrial fibrillation on Eliquis, COPD came to the hospital from outpatient oncology office for evaluation of low hemoglobin level.  In the ER patient was found to have hemoglobin of 7.3 but no complaints of any signs of blood loss but was admitted to the hospital given she was on Eliquis.  Assessment & Plan:   Active Problems:   COPD with emphysema    PAF (paroxysmal atrial fibrillation) s/p multiple DCCVs; on Amiodarone   Poorly controlled diabetes mellitus (HCC)   Iron deficiency anemia   GI bleeding  Symptomatic anemia -Low hemoglobin, 7.3 with baseline hemoglobin around 8.0-10.0 - Patient has received multiple transfusions in the past. Will give one unit PRBC today.  -Patient is Hemoccult negative.  Hemodynamically stable.  No active signs of bleeding at this time - Spoken with gastroenterology, Dr Collene Mares who recommends outpatient follow-up as long as patient does not have any bleeding in the hospital -She has an outpatient follow-up appointment coming up on Monday.  Will resume patient's Eliquis -Had outpatient endoscopy and colonoscopy back in 2013-results have been reviewed.  Paroxysmal atrial fibrillation -Resume Eliquis.  Continue home regimen of amiodarone, Cardizem, metoprolol  History of GERD -Continue PPI  CKD stage III -Creatinine appears to be at baseline.  Avoid nephrotoxic drugs   COPD with emphysema with chronic hypoxia.  -Nebulizer treatments as needed.  Chronically on 4 L nasal cannula at home  Diabetes mellitus type 2 -Insulin sliding scale as needed   DVT prophylaxis: Eliquis Code Status: DNR Family Communication: None at bedside Disposition Plan: Likely discharge in next 24 hours  Consultants:   None  Procedures:   None   Antimicrobials:   None    Subjective: No acute events  overnight.  No more bleeding overnight.  Review of Systems Otherwise negative except as per HPI, including: General: Denies fever, chills, night sweats or unintended weight loss. Resp: Denies cough, wheezing, shortness of breath. Cardiac: Denies chest pain, palpitations, orthopnea, paroxysmal nocturnal dyspnea. GI: Denies abdominal pain, nausea, vomiting, diarrhea or constipation GU: Denies dysuria, frequency, hesitancy or incontinence MS: Denies muscle aches, joint pain or swelling Neuro: Denies headache, neurologic deficits (focal weakness, numbness, tingling), abnormal gait Psych: Denies anxiety, depression, SI/HI/AVH Skin: Denies new rashes or lesions ID: Denies sick contacts, exotic exposures, travel  Objective: Vitals:   05/22/18 0220 05/22/18 0517 05/22/18 0829 05/22/18 1307  BP: (!) 127/57 (!) 116/46  (!) 127/42  Pulse: 62 64  (!) 59  Resp: 16 (!) 24  20  Temp: 98.4 F (36.9 C) 97.7 F (36.5 C)  97.7 F (36.5 C)  TempSrc: Oral   Oral  SpO2: 100% 100% (!) 9% 93%  Weight:      Height:        Intake/Output Summary (Last 24 hours) at 05/22/2018 1403 Last data filed at 05/22/2018 1054 Gross per 24 hour  Intake 1480.83 ml  Output 450 ml  Net 1030.83 ml   Filed Weights   05/21/18 1324  Weight: (!) 137.9 kg (304 lb)    Examination:  General exam: Appears calm and comfortable, morbidly obese, 4 L nasal cannula Respiratory system: Clear to auscultation. Respiratory effort normal. Cardiovascular system: S1 & S2 heard, RRR. No JVD, murmurs, rubs, gallops or clicks. No pedal edema. Gastrointestinal system: Abdomen is nondistended, soft and nontender. No organomegaly or masses felt. Normal  bowel sounds heard. Central nervous system: Alert and oriented. No focal neurological deficits. Extremities: Symmetric 5 x 5 power. Skin: No rashes, lesions or ulcers Psychiatry: Judgement and insight appear normal. Mood & affect appropriate.     Data Reviewed:   CBC: Recent Labs    Lab 05/21/18 1139 05/21/18 1703 05/22/18 0358 05/22/18 1233  WBC 9.1 7.9 7.7 7.7  NEUTROABS 8.0*  --   --   --   HGB 7.3* 7.5* 8.1* 7.7*  HCT 25.7* 25.6* 27.3* 26.5*  MCV 107.5* 104.9* 103.8* 104.3*  PLT 218 206 186 371   Basic Metabolic Panel: Recent Labs  Lab 05/21/18 1139  NA 145  K 4.2  CL 95*  CO2 34*  GLUCOSE 84  BUN 39*  CREATININE 2.00*  CALCIUM 9.2   GFR: Estimated Creatinine Clearance: 36.2 mL/min (A) (by C-G formula based on SCr of 2 mg/dL (H)). Liver Function Tests: Recent Labs  Lab 05/21/18 1139  AST 23  ALT 18  ALKPHOS 63  BILITOT 0.6  PROT 6.8  ALBUMIN 3.3*   No results for input(s): LIPASE, AMYLASE in the last 168 hours. No results for input(s): AMMONIA in the last 168 hours. Coagulation Profile: Recent Labs  Lab 05/21/18 1329  INR 1.80   Cardiac Enzymes: Recent Labs  Lab 05/21/18 1329  TROPONINI <0.03   BNP (last 3 results) No results for input(s): PROBNP in the last 8760 hours. HbA1C: No results for input(s): HGBA1C in the last 72 hours. CBG: Recent Labs  Lab 05/21/18 1515 05/21/18 1732 05/21/18 2102 05/22/18 0746 05/22/18 1128  GLUCAP 95 76 120* 130* 127*   Lipid Profile: No results for input(s): CHOL, HDL, LDLCALC, TRIG, CHOLHDL, LDLDIRECT in the last 72 hours. Thyroid Function Tests: No results for input(s): TSH, T4TOTAL, FREET4, T3FREE, THYROIDAB in the last 72 hours. Anemia Panel: Recent Labs    05/21/18 1139 05/21/18 1140  FERRITIN  --  367*  TIBC  --  264  IRON  --  40*  RETICCTPCT 6.0*  --    Sepsis Labs: Recent Labs  Lab 05/21/18 1336  LATICACIDVEN 1.07    No results found for this or any previous visit (from the past 240 hour(s)).       Radiology Studies: No results found.      Scheduled Meds: . allopurinol  100 mg Oral Daily  . amiodarone  200 mg Oral Daily  . apixaban  5 mg Oral BID  . arformoterol  15 mcg Nebulization BID   And  . umeclidinium bromide  1 puff Inhalation Daily  .  diltiazem  180 mg Oral Daily  . ezetimibe-simvastatin  1 tablet Oral QHS  . insulin aspart  0-20 Units Subcutaneous TID WC  . insulin aspart  0-5 Units Subcutaneous QHS  . metoprolol tartrate  50 mg Oral BID  . pantoprazole  40 mg Oral BID   Continuous Infusions:   LOS: 1 day    I have spent 35 minutes face to face with the patient and on the ward discussing the patients care, assessment, plan and disposition with other care givers. >50% of the time was devoted counseling the patient about the risks and benefits of treatment and coordinating care.     Ireoluwa Gorsline Arsenio Loader, MD [facility] Pager [phone]   If 7PM-7AM, please contact night-coverage [email or web address] Password [facility] 05/22/2018, 2:03 PM

## 2018-05-23 LAB — GLUCOSE, CAPILLARY
GLUCOSE-CAPILLARY: 132 mg/dL — AB (ref 70–99)
Glucose-Capillary: 144 mg/dL — ABNORMAL HIGH (ref 70–99)
Glucose-Capillary: 206 mg/dL — ABNORMAL HIGH (ref 70–99)
Glucose-Capillary: 145 mg/dL — ABNORMAL HIGH (ref 70–99)

## 2018-05-23 LAB — CBC
HCT: 28.1 % — ABNORMAL LOW (ref 36.0–46.0)
HCT: 28.6 % — ABNORMAL LOW (ref 36.0–46.0)
HEMOGLOBIN: 8.4 g/dL — AB (ref 12.0–15.0)
Hemoglobin: 8.4 g/dL — ABNORMAL LOW (ref 12.0–15.0)
MCH: 30.1 pg (ref 26.0–34.0)
MCH: 30.8 pg (ref 26.0–34.0)
MCHC: 29.4 g/dL — AB (ref 30.0–36.0)
MCHC: 29.9 g/dL — ABNORMAL LOW (ref 30.0–36.0)
MCV: 102.5 fL — ABNORMAL HIGH (ref 78.0–100.0)
MCV: 102.9 fL — AB (ref 78.0–100.0)
PLATELETS: 176 10*3/uL (ref 150–400)
PLATELETS: 181 10*3/uL (ref 150–400)
RBC: 2.73 MIL/uL — AB (ref 3.87–5.11)
RBC: 2.79 MIL/uL — ABNORMAL LOW (ref 3.87–5.11)
RDW: 18 % — ABNORMAL HIGH (ref 11.5–15.5)
RDW: 18.3 % — AB (ref 11.5–15.5)
WBC: 7.3 10*3/uL (ref 4.0–10.5)
WBC: 7.9 10*3/uL (ref 4.0–10.5)

## 2018-05-23 LAB — TYPE AND SCREEN
ABO/RH(D): A POS
ANTIBODY SCREEN: NEGATIVE
UNIT DIVISION: 0
Unit division: 0

## 2018-05-23 LAB — BPAM RBC
BLOOD PRODUCT EXPIRATION DATE: 201907112359
Blood Product Expiration Date: 201907242359
ISSUE DATE / TIME: 201906261723
ISSUE DATE / TIME: 201906252246
UNIT TYPE AND RH: 6200
Unit Type and Rh: 6200

## 2018-05-23 MED ORDER — VITAMIN D3 25 MCG (1000 UNIT) PO TABS
2000.0000 [IU] | ORAL_TABLET | Freq: Every day | ORAL | Status: DC
  Administered 2018-05-23: 2000 [IU] via ORAL
  Filled 2018-05-23: qty 2

## 2018-05-23 MED ORDER — VITAMIN C 500 MG PO TABS
500.0000 mg | ORAL_TABLET | Freq: Every day | ORAL | Status: DC
  Administered 2018-05-23: 500 mg via ORAL
  Filled 2018-05-23: qty 1

## 2018-05-23 MED ORDER — CALCIUM CARBONATE-VITAMIN D 500-200 MG-UNIT PO TABS: 1.0000 | ORAL_TABLET | Freq: Every day | ORAL | Status: DC

## 2018-05-23 NOTE — NC FL2 (Signed)
[place] LEVEL OF CARE SCREENING TOOL     IDENTIFICATION  Patient Name: Paige Hardin Birthdate: 1946-09-06 Sex: female Admission Date (Current Location): 05/21/2018  [facility] and [place] Number:  Herbalist and Address:  [facility] [place] [place],  [place] [address], [place]      Provider Number: [id number]  Attending Physician Name and Address:  Damita Lack, MD  Relative Name and Phone Number:       Current Level of Care: Hospital Recommended Level of Care: [place] Prior Approval Number:    Date Approved/Denied:   PASRR Number:    Discharge Plan: Other (Comment)(assisted living )    Current Diagnoses: Patient Active Problem List   Diagnosis Date Noted  . GI bleeding 05/21/2018  . Anemia of chronic renal failure, stage 3 (moderate) ([place]) 03/27/2017  . CHF (congestive heart failure) ([place]) 10/17/2016  . UTI (urinary tract infection) 10/17/2016  . Solitary pulmonary nodule 12/23/2015  . Type 2 diabetes mellitus with hyperglycemia ([place]) 11/16/2015  . Hyperlipidemia 11/16/2015  . Excessive daytime sleepiness 09/08/2015  . Iron deficiency anemia 03/29/2014  . Altered mental status 02/24/2014  . HTN (hypertension) 01/21/2014  . Chronic anticoagulation 01/21/2014  . Pulmonary HTN- pa 53 mmHg Echo 01/15/14 01/21/2014  . Morbid obesity- BMI 50 01/21/2014  . Poorly controlled diabetes mellitus ([place]) 01/21/2014  . Chronic renal disease, stage III ([place]) 01/21/2014  . Chronic diastolic heart failure ([place]) 01/14/2014  . CHEST PAIN 09/26/2010  . HYPERLIPIDEMIA-MIXED 05/14/2009  . HOARSENESS 05/14/2009  . CAROTID BRUIT 05/14/2009  . COPD with emphysema  05/05/2009  . PAF (paroxysmal atrial fibrillation) s/p multiple DCCVs; on Amiodarone 05/05/2009    Orientation RESPIRATION BLADDER Height & Weight     Self, Situation, Place  O2(4L) Continent Weight: (!) 304 lb (137.9 kg) Height:  5' 5.5" (166.4 cm)  BEHAVIORAL  SYMPTOMS/MOOD NEUROLOGICAL BOWEL NUTRITION STATUS      Continent Diet(no added salt)  AMBULATORY STATUS COMMUNICATION OF NEEDS Skin   Supervision   Normal                       Personal Care Assistance Level of Assistance  Bathing, Feeding, Dressing Bathing Assistance: Limited assistance Feeding assistance: Independent Dressing Assistance: Limited assistance     Functional Limitations Info  Sight, Hearing, Speech Sight Info: Adequate Hearing Info: Impaired Speech Info: Adequate    SPECIAL CARE FACTORS FREQUENCY                       Contractures Contractures Info: Not present    Additional Factors Info  Code Status, Allergies Code Status Info: DNR Allergies Info:  Ace Inhibitors, Clindamycin           Current Medications (05/23/2018):  This is the current hospital active medication list Current Facility-Administered Medications  Medication Dose Route Frequency Provider Last Rate Last Dose  . acetaminophen (TYLENOL) tablet 325 mg  325 mg Oral Daily PRN Velvet Bathe, MD      . albuterol (PROVENTIL) (2.5 MG/3ML) 0.083% nebulizer solution 3 mL  3 mL Inhalation Q6H PRN Velvet Bathe, MD      . allopurinol (ZYLOPRIM) tablet 100 mg  100 mg Oral Daily Velvet Bathe, MD   100 mg at 05/23/18 1856  . amiodarone (PACERONE) tablet 200 mg  200 mg Oral Daily Velvet Bathe, MD   200 mg at 05/23/18 3149  . apixaban (ELIQUIS) tablet 5 mg  5 mg  Oral BID Damita Lack, MD   5 mg at 05/23/18 4259  . arformoterol (BROVANA) nebulizer solution 15 mcg  15 mcg Nebulization BID Lenis Noon, RPH   15 mcg at 05/23/18 5638   And  . umeclidinium bromide (INCRUSE ELLIPTA) 62.5 MCG/INH 1 puff  1 puff Inhalation Daily Lenis Noon, RPH   1 puff at 05/23/18 0856  . diltiazem (CARDIZEM CD) 24 hr capsule 180 mg  180 mg Oral Daily Velvet Bathe, MD   180 mg at 05/23/18 7564  . ezetimibe-simvastatin (VYTORIN) 10-40 MG per tablet 1 tablet  1 tablet Oral QHS Velvet Bathe, MD   1 tablet at  05/22/18 2149  . insulin aspart (novoLOG) injection 0-20 Units  0-20 Units Subcutaneous TID WC Velvet Bathe, MD   3 Units at 05/23/18 [phone]  . insulin aspart (novoLOG) injection 0-5 Units  0-5 Units Subcutaneous QHS Velvet Bathe, MD      . metoprolol tartrate (LOPRESSOR) tablet 50 mg  50 mg Oral BID Velvet Bathe, MD   50 mg at 05/23/18 0827  . pantoprazole (PROTONIX) EC tablet 40 mg  40 mg Oral BID Amin, Ankit Chirag, MD   40 mg at 05/23/18 5188     Discharge Medications: TAKE these medications   acetaminophen 325 MG tablet Commonly known as:  TYLENOL Take 325 mg by mouth daily as needed for mild pain.   albuterol (2.5 MG/3ML) 0.083% nebulizer solution Commonly known as:  PROVENTIL Take 2.5 mg by nebulization every 6 (six) hours as needed for wheezing or shortness of breath.   albuterol 108 (90 Base) MCG/ACT inhaler Commonly known as:  PROVENTIL HFA;VENTOLIN HFA Inhale 2 puffs into the lungs every 6 (six) hours as needed for wheezing or shortness of breath.   allopurinol 100 MG tablet Commonly known as:  ZYLOPRIM Take 1 tablet (100 mg total) by mouth daily.   amiodarone 200 MG tablet Commonly known as:  PACERONE Take 1 tablet (200 mg total) by mouth daily.   apixaban 5 MG Tabs tablet Commonly known as:  ELIQUIS Take 1 tablet (5 mg total) by mouth 2 (two) times daily.   calcium-vitamin D 500-200 MG-UNIT tablet Commonly known as:  OSCAL WITH D Take 1 tablet by mouth daily with breakfast.   DIABETIC TUSSIN EX 100 MG/5ML liquid Generic drug:  guaiFENesin Take 200 mg by mouth 3 (three) times daily as needed for cough.   diltiazem 180 MG 24 hr capsule Commonly known as:  CARDIZEM CD Take 1 capsule (180 mg total) by mouth daily.   ezetimibe-simvastatin 10-40 MG tablet Commonly known as:  VYTORIN Take 1 tablet by mouth at bedtime.   ferrous sulfate 325 (65 FE) MG tablet Take 325 mg by mouth 2 (two) times daily with a meal.   furosemide 80 MG tablet Commonly  known as:  LASIX Take 2 tablets (160 mg total) by mouth 2 (two) times daily. MAKE SURE TO TAKE AT 8 AM AND 2 PM   insulin aspart 100 UNIT/ML injection Commonly known as:  novoLOG Inject 0-9 Units into the skin 3 (three) times daily with meals. Correction factor Sliding scale CBG 70 - 120: 0 units CBG 121 - 150: 1 unit,  CBG 151 - 200: 2 units,  CBG 201 - 250: 3 units,  CBG 251 - 300: 5 units,  CBG 301 - 350: 7 units,  CBG 351 - 400: 9 units   CBG > 400: 9 units and notify your MD   insulin NPH Human  100 UNIT/ML injection Commonly known as:  HUMULIN N,NOVOLIN N Inject 20 Units into the skin 2 (two) times daily before a meal.   metolazone 2.5 MG tablet Commonly known as:  ZAROXOLYN Every other Wednesday   metoprolol tartrate 50 MG tablet Commonly known as:  LOPRESSOR Take 1 tablet (50 mg total) by mouth 2 (two) times daily.   MULTIVITAMIN/IRON PO Take 1 tablet by mouth daily.   omeprazole 20 MG capsule Commonly known as:  PRILOSEC Take 20 mg by mouth daily.   ondansetron 4 MG tablet Commonly known as:  ZOFRAN every 8 (eight) hours as needed.   OXYGEN Inhale 2 L into the lungs continuous.   potassium chloride SA 20 MEQ tablet Commonly known as:  K-DUR,KLOR-CON Take 20 mEq by mouth 3 (three) times daily. MAKE SURE TO TAKE EXTRA 20 MEQ ON METOLAZONE DAYS   Tiotropium Bromide-Olodaterol 2.5-2.5 MCG/ACT Aers Commonly known as:  STIOLTO RESPIMAT Inhale 2 puffs into the lungs daily.   vitamin B-12 500 MCG tablet Commonly known as:  CYANOCOBALAMIN Take 500 mcg by mouth daily.   Vitamin C 500 MG Caps Take 500 mg by mouth daily.   Vitamin D 2000 units tablet Take 2,000 Units by mouth daily.      Relevant Imaging Results:  Relevant Lab Results:   Additional Information SS#: [id number]  Nila Nephew, LCSW

## 2018-05-23 NOTE — Progress Notes (Signed)
During RN assessment patient L inner eye has blood in it. Pupil responsive and reactive to light. MD made aware.

## 2018-05-23 NOTE — Progress Notes (Signed)
Report given to [facility] at [place].

## 2018-05-23 NOTE — Progress Notes (Addendum)
Physician Discharge Summary  Paige Hardin TMH:[id number] DOB: 1946-09-16 DOA: 05/21/2018  PCP: Reymundo Poll, MD  Admit date: 05/21/2018 Discharge date: 05/23/2018  Admitted From: Home Disposition: Home  Recommendations for Outpatient Follow-up:  1. Follow up with PCP in 1-2 weeks 2. Please obtain BMP/CBC in one week your next doctors visit.  3. Follow up outpatient with Dr Benson Norway from GI on 05/27/18   Discharge Condition: Stable CODE STATUS: DNR Diet recommendation: Cardiac  Brief/Interim Summary:  72 year old with a history of CKD, atrial fibrillation on Eliquis, COPD came to the hospital from outpatient oncology office for evaluation of low hemoglobin level.  In the ER patient was found to have hemoglobin of 7.3 but no complaints of any signs of blood loss but was admitted to the hospital given she was on Eliquis.  During the hospital stay she was given PRBC transfusion which improved her hemoglobin to 8.4.  Her Hemoccult remain negative, case was discussed with Dr Collene Mares from gastroenterology who recommended outpatient follow-up with Dr. Benson Norway on May 27, 2018.  At this point patient has remained medically stable with outpatient follow-up recommendations as stated above.   Discharge Diagnoses:  Active Problems:   COPD with emphysema    PAF (paroxysmal atrial fibrillation) s/p multiple DCCVs; on Amiodarone   Poorly controlled diabetes mellitus (HCC)   Iron deficiency anemia   GI bleeding   Symptomatic anemia; improved  -Low hemoglobin, 7.3 with baseline hemoglobin around 8.0-10.0 -Hb is at baseline today after 1U PRBC tx. Its 8.4 -Patient is Hemoccult negative.  Hemodynamically stable.  No active signs of bleeding at this time - Spoken with gastroenterology, Dr Collene Mares who recommends outpatient follow-up as long as patient does not have any bleeding in the hospital. Patient will gfollow up with Dr Benson Norway on 05/27/18 -She has an outpatient follow-up appointment coming up on Monday. Eliquis  resumed.  -Had outpatient endoscopy and colonoscopy back in 2013-results have been reviewed.  Paroxysmal atrial fibrillation -Resume Eliquis.  Continue home regimen of amiodarone, Cardizem, metoprolol  History of GERD -Continue PPI  CKD stage III -Creatinine appears to be at baseline.  Avoid nephrotoxic drugs   COPD with emphysema with chronic hypoxia.  -Nebulizer treatments as needed.  Chronically on 4 L nasal cannula at home  Diabetes mellitus type 2 -Insulin sliding scale as needed  Subconjunctival hemorrhage- From minor trauma, rubbbing her eyes. Supportive care.    DVT prophylaxis: Eliquis Code Status: DNR Family Communication: None at bedside Disposition Plan: Discharge today.    Discharge Instructions   Allergies as of 05/23/2018      Reactions   Ace Inhibitors Cough   Clindamycin Rash      Medication List    TAKE these medications   acetaminophen 325 MG tablet Commonly known as:  TYLENOL Take 325 mg by mouth daily as needed for mild pain.   albuterol (2.5 MG/3ML) 0.083% nebulizer solution Commonly known as:  PROVENTIL Take 2.5 mg by nebulization every 6 (six) hours as needed for wheezing or shortness of breath.   albuterol 108 (90 Base) MCG/ACT inhaler Commonly known as:  PROVENTIL HFA;VENTOLIN HFA Inhale 2 puffs into the lungs every 6 (six) hours as needed for wheezing or shortness of breath.   allopurinol 100 MG tablet Commonly known as:  ZYLOPRIM Take 1 tablet (100 mg total) by mouth daily.   amiodarone 200 MG tablet Commonly known as:  PACERONE Take 1 tablet (200 mg total) by mouth daily.   apixaban 5 MG Tabs tablet Commonly known  asArne Cleveland Take 1 tablet (5 mg total) by mouth 2 (two) times daily.   calcium-vitamin D 500-200 MG-UNIT tablet Commonly known as:  OSCAL WITH D Take 1 tablet by mouth daily with breakfast.   DIABETIC TUSSIN EX 100 MG/5ML liquid Generic drug:  guaiFENesin Take 200 mg by mouth 3 (three) times daily as  needed for cough.   diltiazem 180 MG 24 hr capsule Commonly known as:  CARDIZEM CD Take 1 capsule (180 mg total) by mouth daily.   ezetimibe-simvastatin 10-40 MG tablet Commonly known as:  VYTORIN Take 1 tablet by mouth at bedtime.   ferrous sulfate 325 (65 FE) MG tablet Take 325 mg by mouth 2 (two) times daily with a meal.   furosemide 80 MG tablet Commonly known as:  LASIX Take 2 tablets (160 mg total) by mouth 2 (two) times daily. MAKE SURE TO TAKE AT 8 AM AND 2 PM   insulin aspart 100 UNIT/ML injection Commonly known as:  novoLOG Inject 0-9 Units into the skin 3 (three) times daily with meals. Correction factor Sliding scale CBG 70 - 120: 0 units CBG 121 - 150: 1 unit,  CBG 151 - 200: 2 units,  CBG 201 - 250: 3 units,  CBG 251 - 300: 5 units,  CBG 301 - 350: 7 units,  CBG 351 - 400: 9 units   CBG > 400: 9 units and notify your MD   insulin NPH Human 100 UNIT/ML injection Commonly known as:  HUMULIN N,NOVOLIN N Inject 20 Units into the skin 2 (two) times daily before a meal.   metolazone 2.5 MG tablet Commonly known as:  ZAROXOLYN Every other Wednesday   metoprolol tartrate 50 MG tablet Commonly known as:  LOPRESSOR Take 1 tablet (50 mg total) by mouth 2 (two) times daily.   MULTIVITAMIN/IRON PO Take 1 tablet by mouth daily.   omeprazole 20 MG capsule Commonly known as:  PRILOSEC Take 20 mg by mouth daily.   ondansetron 4 MG tablet Commonly known as:  ZOFRAN every 8 (eight) hours as needed.   OXYGEN Inhale 4 L into the lungs continuous.   potassium chloride SA 20 MEQ tablet Commonly known as:  K-DUR,KLOR-CON Take 20 mEq by mouth 3 (three) times daily. MAKE SURE TO TAKE EXTRA 20 MEQ ON METOLAZONE DAYS   Tiotropium Bromide-Olodaterol 2.5-2.5 MCG/ACT Aers Commonly known as:  STIOLTO RESPIMAT Inhale 2 puffs into the lungs daily.   vitamin B-12 500 MCG tablet Commonly known as:  CYANOCOBALAMIN Take 500 mcg by mouth daily.   Vitamin C 500 MG Caps Take 500 mg  by mouth daily.   Vitamin D 2000 units tablet Take 2,000 Units by mouth daily.      Follow-up Information    Reymundo Poll, MD. Schedule an appointment as soon as possible for a visit in 2 week(s).   Specialty:  Family Medicine Contact information: [address] [place] [address] [phone]        Carol Ada, MD. Schedule an appointment as soon as possible for a visit in 4 day(s).   Specialty:  Gastroenterology Contact information: [place], [place] [address] [phone]          Allergies  Allergen Reactions  . Ace Inhibitors Cough  . Clindamycin Rash    You were cared for by a hospitalist during your hospital stay. If you have any questions about your discharge medications or the care you received while you were in the hospital after  you are discharged, you can call the unit and asked to speak with the hospitalist on call if the hospitalist that took care of you is not available. Once you are discharged, your primary care physician will handle any further medical issues. Please note that no refills for any discharge medications will be authorized once you are discharged, as it is imperative that you return to your primary care physician (or establish a relationship with a primary care physician if you do not have one) for your aftercare needs so that they can reassess your need for medications and monitor your lab values.  Consultations:  Curbsided GI   Procedures/Studies: No results found.   Subjective: No complaints besides erythema of her eye. No pain, blurry vision or any complaints.   General = no fevers, chills, dizziness, malaise, fatigue HEENT/EYES = negative for pain, redness, loss of vision, double vision, blurred vision, loss of hearing, sore throat, hoarseness, dysphagia Cardiovascular= negative for chest pain, palpitation, murmurs, lower extremity swelling Respiratory/lungs= negative for shortness of  breath, cough, hemoptysis, wheezing, mucus production Gastrointestinal= negative for nausea, vomiting,, abdominal pain, melena, hematemesis Genitourinary= negative for Dysuria, Hematuria, Change in Urinary Frequency MSK = Negative for arthralgia, myalgias, Back Pain, Joint swelling  Neurology= Negative for headache, seizures, numbness, tingling  Psychiatry= Negative for anxiety, depression, suicidal and homocidal ideation Allergy/Immunology= Medication/Food allergy as listed  Skin= Negative for Rash, lesions, ulcers, itching   Discharge Exam: Vitals:   05/23/18 0856 05/23/18 1323  BP:  (!) 132/52  Pulse:  60  Resp:  18  Temp:  98.1 F (36.7 C)  SpO2: 92% 91%   Vitals:   05/23/18 0500 05/23/18 0821 05/23/18 0856 05/23/18 1323  BP: (!) 106/47 (!) 125/56  (!) 132/52  Pulse: 62 65  60  Resp: 16   18  Temp: 98.1 F (36.7 C)   98.1 F (36.7 C)  TempSrc: Oral   Oral  SpO2: 98%  92% 91%  Weight:      Height:        General: Pt is alert, awake, not in acute distress; morbidly obese, On 4L [place] Cardiovascular: RRR, S1/S2 +, no rubs, no gallops Respiratory: CTA bilaterally, no wheezing, no rhonchi Abdominal: Soft, NT, ND, bowel sounds + Extremities: no edema, no cyanosis    The results of significant diagnostics from this hospitalization (including imaging, microbiology, ancillary and laboratory) are listed below for reference.     Microbiology: No results found for this or any previous visit (from the past 240 hour(s)).   Labs: BNP (last 3 results) No results for input(s): BNP in the last 8760 hours. Basic Metabolic Panel: Recent Labs  Lab 05/21/18 1139  NA 145  K 4.2  CL 95*  CO2 34*  GLUCOSE 84  BUN 39*  CREATININE 2.00*  CALCIUM 9.2   Liver Function Tests: Recent Labs  Lab 05/21/18 1139  AST 23  ALT 18  ALKPHOS 63  BILITOT 0.6  PROT 6.8  ALBUMIN 3.3*   No results for input(s): LIPASE, AMYLASE in the last 168 hours. No results for input(s): AMMONIA in  the last 168 hours. CBC: Recent Labs  Lab 05/21/18 1139 05/21/18 1703 05/22/18 0358 05/22/18 1233 05/23/18 0027 05/23/18 0715  WBC 9.1 7.9 7.7 7.7 7.9 7.3  NEUTROABS 8.0*  --   --   --   --   --   HGB 7.3* 7.5* 8.1* 7.7* 8.4* 8.4*  HCT 25.7* 25.6* 27.3* 26.5* 28.6* 28.1*  MCV 107.5* 104.9* 103.8* 104.3*  102.5* 102.9*  PLT 218 206 186 181 181 176   Cardiac Enzymes: Recent Labs  Lab 05/21/18 1329  TROPONINI <0.03   BNP: Invalid input(s): POCBNP CBG: Recent Labs  Lab 05/22/18 1128 05/22/18 1723 05/22/18 2103 05/23/18 0753 05/23/18 1149  GLUCAP 127* 144* 126* 132* 206*   D-Dimer No results for input(s): DDIMER in the last 72 hours. Hgb A1c No results for input(s): HGBA1C in the last 72 hours. Lipid Profile No results for input(s): CHOL, HDL, LDLCALC, TRIG, CHOLHDL, LDLDIRECT in the last 72 hours. Thyroid function studies No results for input(s): TSH, T4TOTAL, T3FREE, THYROIDAB in the last 72 hours.  Invalid input(s): FREET3 Anemia work up Recent Labs    05/21/18 1139 05/21/18 1140  FERRITIN  --  367*  TIBC  --  264  IRON  --  40*  RETICCTPCT 6.0*  --    Urinalysis    Component Value Date/Time   COLORURINE YELLOW 10/17/2016 [place] 10/17/2016 1608   LABSPEC 1.008 10/17/2016 1608   PHURINE 6.0 10/17/2016 1608   GLUCOSEU NEGATIVE 10/17/2016 1608   HGBUR MODERATE (A) 10/17/2016 1608   BILIRUBINUR NEGATIVE 10/17/2016 [place] 10/17/2016 1608   PROTEINUR NEGATIVE 10/17/2016 1608   UROBILINOGEN 1.0 03/23/2014 1915   NITRITE POSITIVE (A) 10/17/2016 1608   LEUKOCYTESUR MODERATE (A) 10/17/2016 1608   Sepsis Labs Invalid input(s): PROCALCITONIN,  WBC,  LACTICIDVEN Microbiology No results found for this or any previous visit (from the past 240 hour(s)).   Time coordinating discharge:  I have spent 35 minutes face to face with the patient and on the ward discussing the patients care, assessment, plan and disposition with  other care givers. >50% of the time was devoted counseling the patient about the risks and benefits of treatment/Discharge disposition and coordinating care.   SIGNED:   Damita Lack, MD  [facility] 05/23/2018, 2:28 PM Pager   If 7PM-7AM, please contact night-coverage [email or web address] Password [email or web address]

## 2018-05-23 NOTE — Clinical Social Work Note (Signed)
Pt returning to [facility]- report [phone]  Clinical Social Work Assessment  Patient Details  Name: Paige Hardin MRN: [id number] Date of Birth: [birth year]  Date of referral:  05/23/18               Reason for consult:  (pt admitted from facility)                Permission sought to share information with:  Family Supports, Customer service manager Permission granted to share information::     Name::        Agency::  [facility]  Relationship::     Contact Information:     Housing/Transportation Living arrangements for the past 2 months:  [place] of Information:  Patient, Facility Patient Interpreter Needed:  None Criminal Activity/Legal Involvement Pertinent to Current Situation/Hospitalization:  No - Comment as needed Significant Relationships:  Spouse, Warehouse manager Lives with:  Facility Resident Do you feel safe going back to the place where you live?  Yes Need for family participation in patient care:  No (Coment)  Care giving concerns:  Pt admitted from [facility] where she has been a resident for a few years. Pleased with care, no concerns reported. Admitted for anemia and management of COPD (on 4 L chronic O2 at home)  Facilities manager / plan:  CSW consulted to assist with disposition as pt is resident of facility-[facility]. Plan to return at [place] spoke with [place] at [facility], provided DC information/summary/FL2. Pt needing ptar transport- CSW arranged. Pt on phone with husband at time of assessment and he is aware of plan for DC today  Employment status:  Retired Forensic scientist:  Medicare PT Recommendations:  Not assessed at this time Information / Referral to community resources:     Patient/Family's Response to care:  apprecative  Patient/Family's Understanding of and Emotional Response to Diagnosis, Current Treatment, and Prognosis:  Did not discuss treatment received, however  re: DC plan pt's emotional response was excited and pleasant- "ready to go"  Emotional Assessment Appearance:  Appears stated age Attitude/Demeanor/Rapport:  Engaged Affect (typically observed):  Calm, Pleasant Orientation:  Oriented to Self, Oriented to Place, Oriented to  Time, Oriented to Situation Alcohol / Substance use:  Not Applicable Psych involvement (Current and /or in the community):  No (Comment)  Discharge Needs  Concerns to be addressed:  Care Coordination Readmission within the last 30 days:  No Current discharge risk:  None Barriers to Discharge:  No Barriers Identified   Nila Nephew, LCSW 05/23/2018, 2:46 PM  [phone]

## 2018-05-23 NOTE — Progress Notes (Signed)
Patient discharge teaching given, including activity, diet, follow-up appoints, and medications. Patient verbalized understanding of all discharge instructions. IV access was d/c'd. Vitals are stable. Skin is intact except as charted in most recent assessments. Pt to be escorted out by [facility].

## 2018-05-24 NOTE — Discharge Summary (Signed)
Refer to note from 05/23/17 1025 am. Labelled at "Progress Note".

## 2018-05-28 DIAGNOSIS — Z79899 Other long term (current) drug therapy: Secondary | ICD-10-CM | POA: Diagnosis not present

## 2018-05-29 DIAGNOSIS — I4891 Unspecified atrial fibrillation: Secondary | ICD-10-CM | POA: Diagnosis not present

## 2018-05-29 DIAGNOSIS — D599 Acquired hemolytic anemia, unspecified: Secondary | ICD-10-CM | POA: Diagnosis not present

## 2018-05-29 DIAGNOSIS — J449 Chronic obstructive pulmonary disease, unspecified: Secondary | ICD-10-CM | POA: Diagnosis not present

## 2018-06-05 DIAGNOSIS — D509 Iron deficiency anemia, unspecified: Secondary | ICD-10-CM | POA: Diagnosis not present

## 2018-06-05 DIAGNOSIS — I5032 Chronic diastolic (congestive) heart failure: Secondary | ICD-10-CM | POA: Diagnosis not present

## 2018-06-05 DIAGNOSIS — J449 Chronic obstructive pulmonary disease, unspecified: Secondary | ICD-10-CM | POA: Diagnosis not present

## 2018-06-05 DIAGNOSIS — N183 Chronic kidney disease, stage 3 (moderate): Secondary | ICD-10-CM | POA: Diagnosis not present

## 2018-06-11 ENCOUNTER — Ambulatory Visit (HOSPITAL_BASED_OUTPATIENT_CLINIC_OR_DEPARTMENT_OTHER)
Admission: RE | Admit: 2018-06-11 | Discharge: 2018-06-11 | Disposition: A | Discharge status: Home / Self Care | Payer: Medicare Other | Source: Ambulatory Visit | Attending: Cardiology | Admitting: Cardiology

## 2018-06-11 ENCOUNTER — Ambulatory Visit (HOSPITAL_COMMUNITY)
Admission: RE | Admit: 2018-06-11 | Discharge: 2018-06-11 | Disposition: A | Discharge status: Home / Self Care | Payer: Medicare Other | Source: Ambulatory Visit | Attending: Cardiology | Admitting: Cardiology

## 2018-06-11 VITALS — BP 137/44 | HR 64 | Wt 305.8 lb

## 2018-06-11 DIAGNOSIS — I272 Pulmonary hypertension, unspecified: Secondary | ICD-10-CM | POA: Insufficient documentation

## 2018-06-11 DIAGNOSIS — I5032 Chronic diastolic (congestive) heart failure: Secondary | ICD-10-CM | POA: Diagnosis not present

## 2018-06-11 DIAGNOSIS — I480 Paroxysmal atrial fibrillation: Secondary | ICD-10-CM

## 2018-06-11 DIAGNOSIS — I361 Nonrheumatic tricuspid (valve) insufficiency: Secondary | ICD-10-CM | POA: Diagnosis not present

## 2018-06-11 DIAGNOSIS — I350 Nonrheumatic aortic (valve) stenosis: Secondary | ICD-10-CM | POA: Diagnosis not present

## 2018-06-11 LAB — LIPID PANEL
Cholesterol: 101 mg/dL (ref 0–200)
HDL: 37 mg/dL — AB (ref >40)
LDL CALC: 49 mg/dL (ref 0–99)
Total CHOL/HDL Ratio: 2.7 RATIO
Triglycerides: 77 mg/dL (ref <150)
VLDL: 15 mg/dL (ref 0–40)

## 2018-06-11 LAB — COMPREHENSIVE METABOLIC PANEL
ALT: 17 U/L (ref 0–44)
ANION GAP: 8 (ref 5–15)
AST: 20 U/L (ref 15–41)
Albumin: 3.2 g/dL — ABNORMAL LOW (ref 3.5–5.0)
Alkaline Phosphatase: 56 U/L (ref 38–126)
BUN: 35 mg/dL — ABNORMAL HIGH (ref 8–23)
CHLORIDE: 96 mmol/L — AB (ref 98–111)
CO2: 36 mmol/L — ABNORMAL HIGH (ref 22–32)
CREATININE: 1.68 mg/dL — AB (ref 0.44–1.00)
Calcium: 9.3 mg/dL (ref 8.9–10.3)
GFR calc Af Amer: 34 mL/min — ABNORMAL LOW (ref >60)
GFR, EST NON AFRICAN AMERICAN: 29 mL/min — AB (ref >60)
Glucose, Bld: 114 mg/dL — ABNORMAL HIGH (ref 70–99)
POTASSIUM: 4.7 mmol/L (ref 3.5–5.1)
SODIUM: 140 mmol/L (ref 135–145)
Total Bilirubin: 0.8 mg/dL (ref 0.3–1.2)
Total Protein: 6.4 g/dL — ABNORMAL LOW (ref 6.5–8.1)

## 2018-06-11 LAB — CBC
HEMATOCRIT: 27.3 % — AB (ref 36.0–46.0)
HEMOGLOBIN: 7.9 g/dL — AB (ref 12.0–15.0)
MCH: 29.4 pg (ref 26.0–34.0)
MCHC: 28.9 g/dL — ABNORMAL LOW (ref 30.0–36.0)
MCV: 101.5 fL — ABNORMAL HIGH (ref 78.0–100.0)
Platelets: 202 10*3/uL (ref 150–400)
RBC: 2.69 MIL/uL — AB (ref 3.87–5.11)
RDW: 16.8 % — AB (ref 11.5–15.5)
WBC: 8.2 10*3/uL (ref 4.0–10.5)

## 2018-06-11 LAB — TSH: TSH: 5.832 u[IU]/mL — AB (ref 0.350–4.500)

## 2018-06-11 MED ORDER — METOLAZONE 2.5 MG PO TABS: ORAL_TABLET | ORAL | 3 refills | Status: DC

## 2018-06-11 NOTE — Patient Instructions (Signed)
Routine lab work today. Will notify you of abnormal results   Repeat labs in 2 weeks.  Follow up with Dr.McLean in 3 months.  Take Metolazone 2.5mg  every Wednesday.  Take Potassium 59meq three times daily. Take an extra 65meq on Metolazone days.

## 2018-06-11 NOTE — Progress Notes (Signed)
  Echocardiogram 2D Echocardiogram has been performed.  Merrie Roof F 06/11/2018, 2:20 PM

## 2018-06-12 ENCOUNTER — Telehealth (HOSPITAL_COMMUNITY): Payer: Self-pay

## 2018-06-12 NOTE — Progress Notes (Signed)
Patient ID: Paige Hardin, female   DOB: 1946/07/12, 72 y.o.   MRN: [id number]     Advanced Heart Failure Clinic Note   Pulmonologist: Dr. Lamonte Sakai  PCP: Tollie Pizza HF Cardiologist: Aundra Dubin   72 yo with history of morbid obesity, COPD on home oxygen, paroxysmal atrial fibrillation, and chronic diastolic CHF presents for followup. She was admitted for several weeks from 3/15 to 4/15 with atrial fibrillation/RVR, acute on chronic diastolic CHF, and COPD exacerbation.  She ended up being cardioverted in the hospital.  She was diuresed with IV Lasix.  After the prolonged hospitalization, she was sent to [facility].  She was again admitted at the end of 4/15 and hospitalized into 5/15 with acute on chronic diastolic CHF. She was intubated this time and went back into atrial fibrillation with RVR.  She was cardioverted back to NSR and is maintained on amiodarone.  She was again diuresed and discharged to SNF [facility]).     Admitted 10/17/16 through 10/20/16 and treated for UTI and mild volume overload. Treated with IV antibiotics and transitioned to keflex. Diuresed with IV lasix and transitioned to lasix 160 mg twice a day. ECHO 09/2016 with EF 45-50% and Grade II DD. Discharge weight was 303 pounds.   She was admitted in 6/19 with anemia, was heme negative.  Eliquis was continued.   Echo done today was reviewed and showed EF 55-60%, mildly dilated RV normal function, PASP 55 mmHg, moderate AS, moderate MR, moderate TR.   She returns today for followup of CHF. She continues to [facility] at [facility] and is very limited.  Weight up 4 lbs since last appointment.  She is not very mobile.  She is short of breath walking around her room, this is stable.  She has slept in a recliner for a long time.  No chest pain. No PND.  She will not wear compression stockings or use unna boots.   Labs (4/15): K 4.4, creatinine 1.4 Labs (5/15): creatinine 1.3, hemoglobin 8.5 Labs (04/30/14 ): K 3.6, creatinine 1.6 Labs (05/05/14) : K  4.4, creatinine 1.42, TSH 3.69, pro-BNP 999, AST 15, ALT 14 Labs (06/18/14): K 4.1, creatinine 1.55, AST 19, ALT 19 Labs (4/16): K 4.3, creatinine 1.33, HCT 36 Labs (5/16): K 4.2, creatinine 2.09, TSH normal, LFTs normal Labs (8/16): K 4.2, creatinine 1.35, TSH normal Labs (10/20/2016): K 4.6 Creatinine 1.65.  Labs (5/18): K 4.1, creatinine 1.7, LFTs normal, hgb 11, TSH normal, BNP 214 Labs (11/18): K 3.8, creatinine 1.7, hgb 10 Labs (6/19): K 4.2, creatinine 2, hgb 8.4  1. Chronic low back pain.  2. COPD: On home oxygen. Prior smoker.  3. Type 2 diabetes.  4. Paroxysmal atrial fibrillation:  DCCV in 2010 and again in 4/15.  She is on warfarin and is on amiodarone to maintain NSR.  5. Hyperlipidemia.  6. Diastolic congestive heart failure: Echo (2/15) with EF 60%, mild MR, PA systolic pressure 53 mmHg. Echo (5/16) with mild LVH, EF 55-60%, mild MR, moderate biatrial enlargement.   - ECHO 09/2016: EF 45-50%. Mild/mod aortic stenosis Grade II DD.  - Echo 5/18: EF 50-55%, normal RV size and systolic function, PASP 51 mmHg, moderate aortic stenosis with mean gradient 27 mmHg, moderate MR.  - Echo (7/19): EF 55-60%, mildly dilated RV normal function, PASP 55 mmHg, moderate AS with mean gradient 28 mmHg and AVA 1.0 cm^2,  moderate MR, moderate TR.  7. Hypertension.  8. Low back pain 9. Obesity 10. CKD 11. Anemia: Fe deficiency 12.  RLL nodule  13. ACEI cough 14. Suspected gout 15. Aortic stenosis: Moderate on 7/19 echo.  16. Negative sleep study 17. Pulmonary nodules: Followed by Dr. Lamonte Sakai  SOCIAL HISTORY: Prior smoker.  Patient is currently in SNF [facility]).  FAMILY HISTORY: Non-Hodgkin lymphoma and breast cancer. No early-onset  coronary artery disease.   REVIEW OF SYSTEMS: Negative except as noted in the history of present  Illness.  Current Outpatient Medications  Medication Sig Dispense Refill  . acetaminophen (TYLENOL) 325 MG tablet Take 325 mg by mouth daily as needed for  mild pain.     Marland Kitchen albuterol (PROVENTIL HFA;VENTOLIN HFA) 108 (90 Base) MCG/ACT inhaler Inhale 2 puffs into the lungs every 6 (six) hours as needed for wheezing or shortness of breath.    Marland Kitchen albuterol (PROVENTIL) (2.5 MG/3ML) 0.083% nebulizer solution Take 2.5 mg by nebulization every 6 (six) hours as needed for wheezing or shortness of breath.    . allopurinol (ZYLOPRIM) 100 MG tablet Take 1 tablet (100 mg total) by mouth daily. 14 tablet 0  . amiodarone (PACERONE) 200 MG tablet Take 1 tablet (200 mg total) by mouth daily.    Marland Kitchen apixaban (ELIQUIS) 5 MG TABS tablet Take 1 tablet (5 mg total) by mouth 2 (two) times daily. 60 tablet   . Ascorbic Acid (VITAMIN C) 500 MG CAPS Take 500 mg by mouth daily.     . calcium-vitamin D (OSCAL WITH D) 500-200 MG-UNIT per tablet Take 1 tablet by mouth daily with breakfast.    . Cholecalciferol (VITAMIN D) 2000 UNITS tablet Take 2,000 Units by mouth daily.    Marland Kitchen diltiazem (CARDIZEM CD) 180 MG 24 hr capsule Take 1 capsule (180 mg total) by mouth daily. 30 capsule 3  . ezetimibe-simvastatin (VYTORIN) 10-40 MG per tablet Take 1 tablet by mouth at bedtime.     . ferrous sulfate 325 (65 FE) MG tablet Take 325 mg by mouth 2 (two) times daily with a meal.     . furosemide (LASIX) 80 MG tablet Take 2 tablets (160 mg total) by mouth 2 (two) times daily. MAKE SURE TO TAKE AT 8 AM AND 2 PM    . guaiFENesin (DIABETIC TUSSIN EX) 100 MG/5ML liquid Take 200 mg by mouth 3 (three) times daily as needed for cough.    . insulin aspart (NOVOLOG) 100 UNIT/ML injection Inject 0-9 Units into the skin 3 (three) times daily with meals. Correction factor Sliding scale CBG 70 - 120: 0 units CBG 121 - 150: 1 unit,  CBG 151 - 200: 2 units,  CBG 201 - 250: 3 units,  CBG 251 - 300: 5 units,  CBG 301 - 350: 7 units,  CBG 351 - 400: 9 units   CBG > 400: 9 units and notify your MD 10 mL 11  . insulin NPH Human (HUMULIN N,NOVOLIN N) 100 UNIT/ML injection Inject 20 Units into the skin 2 (two) times daily  before a meal.     . metolazone (ZAROXOLYN) 2.5 MG tablet Take 1 tablet every Wednesday 15 tablet 3  . metoprolol (LOPRESSOR) 50 MG tablet Take 1 tablet (50 mg total) by mouth 2 (two) times daily. 60 tablet 3  . Multiple Vitamins-Iron (MULTIVITAMIN/IRON PO) Take 1 tablet by mouth daily.    Marland Kitchen omeprazole (PRILOSEC) 20 MG capsule Take 20 mg by mouth daily.    . ondansetron (ZOFRAN) 4 MG tablet every 8 (eight) hours as needed.     . OXYGEN Inhale 4 L into the lungs continuous.     Marland Kitchen  potassium chloride SA (K-DUR,KLOR-CON) 20 MEQ tablet Take 20 mEq by mouth 3 (three) times daily. MAKE SURE TO TAKE EXTRA 20 MEQ ON METOLAZONE DAYS     . Tiotropium Bromide-Olodaterol (STIOLTO RESPIMAT) 2.5-2.5 MCG/ACT AERS Inhale 2 puffs into the lungs daily. 2 Inhaler 6  . vitamin B-12 (CYANOCOBALAMIN) 500 MCG tablet Take 500 mcg by mouth daily.     No current facility-administered medications for this encounter.     Vitals:   06/11/18 1418  BP: (!) 137/44  Pulse: 64  SpO2: 92%  Weight: (!) 305 lb 12.8 oz (138.7 kg)   Wt Readings from Last 3 Encounters:  06/11/18 (!) 305 lb 12.8 oz (138.7 kg)  05/21/18 (!) 304 lb (137.9 kg)  05/21/18 (!) 304 lb 2 oz (138 kg)   General: NAD, obese Neck: JVP 8-9 cm, no thyromegaly or thyroid nodule.  Lungs: Crackles at lung bases. CV: Nondisplaced PMI.  Heart regular S1/S2, no S3/S4, 2/6 SEM RUSB with audible S2.  2+ chronic edema to knees.  No carotid bruit.  Normal pedal pulses.  Abdomen: Soft, nontender, no hepatosplenomegaly, no distention.  Skin: Intact without lesions or rashes.  Neurologic: Alert and oriented x 3.  Psych: Normal affect. Extremities: No clubbing or cyanosis.  HEENT: Normal.   Assessment/Plan:  1. Atrial fibrillation: Paroxysmal.  NSR today. - Continue amiodarone 200 mg daily. LFTs/TSH today.  Will need regular eye exam.  - Continue Eliquis 5 mg bid. CBC today.  2. Chronic diastolic CHF: Echo was done today and reviewed, EF 55-60% with moderate  AS and moderate MR. She does appear to have some volume overload on exam. Stable NYHA class III symptoms. - We have discussed CardioMems to help manage her CHF.  She does not want to do this.  - Continue lasix 160 mg BID.  I will increase metolazone to 2.5 mg weekly.  BMET today and in 2 wks.    3. CKD stage III: BMET today.   4. COPD: Continue home oxygen.     5. Aortic Stenosis: Moderate on 7/19 echo.  Suspect she will need TAVR in the future.  6. Morbid Obesity: Continue weight loss efforts.   7. Debility: PT consult at nursing home.   Followup in 3 months.    Loralie Champagne, MD  06/12/2018

## 2018-06-12 NOTE — Telephone Encounter (Signed)
Notes recorded by Shirley Muscat, RN on 06/12/2018 at 12:59 PM EDT Her Niece is aware of results. No lipid letter mailed pt is in facility. Will have labs done at [facility] order faxed to [phone] ------  Notes recorded by Larey Dresser, MD on 06/11/2018 at 9:29 PM EDT She will need repeat TSH, free T3, and free T4 sent.

## 2018-06-14 ENCOUNTER — Ambulatory Visit: Payer: Medicare Other

## 2018-06-14 ENCOUNTER — Other Ambulatory Visit: Payer: Medicare Other

## 2018-06-14 ENCOUNTER — Ambulatory Visit: Payer: Medicare Other | Admitting: Hematology & Oncology

## 2018-06-18 DIAGNOSIS — R7989 Other specified abnormal findings of blood chemistry: Secondary | ICD-10-CM | POA: Diagnosis not present

## 2018-06-24 ENCOUNTER — Telehealth (HOSPITAL_COMMUNITY): Payer: Self-pay | Admitting: Cardiology

## 2018-06-24 MED ORDER — LEVOTHYROXINE SODIUM 25 MCG PO TABS: 25.0000 ug | ORAL_TABLET | Freq: Every day | ORAL | 3 refills | Status: AC

## 2018-06-24 NOTE — Telephone Encounter (Signed)
ABNORMAL LABS RECEIVED FROM [facility] DRAWN 06/18/18 TSH 7.16 FREE T4 1.2 FREE T3 2.2   PER ASHLEY SMITH,NP START LEVOXYL 25 ONE TAB DAILY, FURTHER MANAGEMENT BY PCP  DAUGHTER AWARE AND ORDER FAXED TO [facility] [phone]

## 2018-06-25 ENCOUNTER — Inpatient Hospital Stay: Payer: Medicare Other

## 2018-06-25 ENCOUNTER — Encounter: Payer: Self-pay | Admitting: Family

## 2018-06-25 ENCOUNTER — Inpatient Hospital Stay (HOSPITAL_BASED_OUTPATIENT_CLINIC_OR_DEPARTMENT_OTHER): Payer: Medicare Other | Admitting: Family

## 2018-06-25 ENCOUNTER — Inpatient Hospital Stay: Payer: Medicare Other | Attending: Hematology & Oncology

## 2018-06-25 ENCOUNTER — Other Ambulatory Visit: Payer: Self-pay

## 2018-06-25 VITALS — BP 109/64 | HR 64 | Temp 97.2°F | Wt 299.8 lb

## 2018-06-25 DIAGNOSIS — D508 Other iron deficiency anemias: Secondary | ICD-10-CM | POA: Diagnosis not present

## 2018-06-25 DIAGNOSIS — N183 Chronic kidney disease, stage 3 unspecified: Secondary | ICD-10-CM

## 2018-06-25 DIAGNOSIS — D631 Anemia in chronic kidney disease: Secondary | ICD-10-CM | POA: Insufficient documentation

## 2018-06-25 DIAGNOSIS — Z79899 Other long term (current) drug therapy: Secondary | ICD-10-CM | POA: Diagnosis not present

## 2018-06-25 DIAGNOSIS — D509 Iron deficiency anemia, unspecified: Secondary | ICD-10-CM | POA: Insufficient documentation

## 2018-06-25 DIAGNOSIS — R042 Hemoptysis: Secondary | ICD-10-CM

## 2018-06-25 DIAGNOSIS — D500 Iron deficiency anemia secondary to blood loss (chronic): Secondary | ICD-10-CM

## 2018-06-25 DIAGNOSIS — N189 Chronic kidney disease, unspecified: Secondary | ICD-10-CM | POA: Insufficient documentation

## 2018-06-25 LAB — CBC WITH DIFFERENTIAL (CANCER CENTER ONLY)
BASOS PCT: 0 %
Basophils Absolute: 0 10*3/uL (ref 0.0–0.1)
EOS ABS: 0.2 10*3/uL (ref 0.0–0.5)
EOS PCT: 3 %
HCT: 28 % — ABNORMAL LOW (ref 34.8–46.6)
Hemoglobin: 8 g/dL — ABNORMAL LOW (ref 11.6–15.9)
LYMPHS PCT: 5 %
Lymphs Abs: 0.5 10*3/uL — ABNORMAL LOW (ref 0.9–3.3)
MCH: 29.5 pg (ref 26.0–34.0)
MCHC: 28.6 g/dL — ABNORMAL LOW (ref 32.0–36.0)
MCV: 103.3 fL — AB (ref 81.0–101.0)
MONO ABS: 0.7 10*3/uL (ref 0.1–0.9)
Monocytes Relative: 7 %
Neutro Abs: 8.2 10*3/uL — ABNORMAL HIGH (ref 1.5–6.5)
Neutrophils Relative %: 85 %
PLATELETS: 210 10*3/uL (ref 145–400)
RBC: 2.71 MIL/uL — ABNORMAL LOW (ref 3.70–5.32)
RDW: 17.2 % — AB (ref 11.1–15.7)
WBC Count: 9.6 10*3/uL (ref 3.9–10.0)

## 2018-06-25 LAB — CMP (CANCER CENTER ONLY)
ALT: 12 U/L (ref 0–44)
AST: 19 U/L (ref 15–41)
Albumin: 3.5 g/dL (ref 3.5–5.0)
Alkaline Phosphatase: 69 U/L (ref 38–126)
Anion gap: 9 (ref 5–15)
BUN: 42 mg/dL — AB (ref 8–23)
CHLORIDE: 92 mmol/L — AB (ref 98–111)
CO2: 41 mmol/L — AB (ref 22–32)
Calcium: 10 mg/dL (ref 8.9–10.3)
Creatinine: 1.91 mg/dL — ABNORMAL HIGH (ref 0.44–1.00)
GFR, EST NON AFRICAN AMERICAN: 25 mL/min — AB (ref >60)
GFR, Est AFR Am: 29 mL/min — ABNORMAL LOW (ref >60)
Glucose, Bld: 90 mg/dL (ref 70–99)
POTASSIUM: 4.8 mmol/L (ref 3.5–5.1)
SODIUM: 142 mmol/L (ref 135–145)
Total Bilirubin: 0.5 mg/dL (ref 0.3–1.2)
Total Protein: 7.1 g/dL (ref 6.5–8.1)

## 2018-06-25 LAB — RETICULOCYTES
RBC.: 2.74 MIL/uL — AB (ref 3.70–5.45)
RETIC CT PCT: 4 % — AB (ref 0.7–2.1)
Retic Count, Absolute: 109.6 10*3/uL — ABNORMAL HIGH (ref 33.7–90.7)

## 2018-06-25 MED ORDER — DARBEPOETIN ALFA 300 MCG/0.6ML IJ SOSY
PREFILLED_SYRINGE | INTRAMUSCULAR | Status: AC
  Filled 2018-06-25: qty 0.6

## 2018-06-25 MED ORDER — DARBEPOETIN ALFA 500 MCG/ML IJ SOSY: 500.0000 ug | PREFILLED_SYRINGE | Freq: Once | INTRAMUSCULAR | Status: DC

## 2018-06-25 NOTE — Progress Notes (Signed)
Patient was given a 364mcg dose of Aranesp as she was always historically given ; MD was not here but Judson Roch NP was made aware.

## 2018-06-25 NOTE — Progress Notes (Signed)
Hematology and Oncology Follow Up Visit  VIRGEN BELLAND [id number] 1946/01/04 72 y.o. 06/25/2018   Principle Diagnosis:  Anemia secondary to iron deficiency  Anemia secondary to renal insufficiency  Chronic anticoagulation for paroxysmal atrial fibrillation  Current Therapy:   IV iron as indicated - last received in October 2018 Aranesp 300 mcg subcutaneous as needed for hemoglobin less than 11  ELIQUIS - life long   Interim History:  Ms. Tindol is here today with her sister for follow-up. She is feeling a bit better since she got out of the hospital in June. Hgb today is 8.0.  Her SOB seems to be stable. She is on 4L supplemental O2 24 hours a day. She has an intermittent non productive cough. She still has some fatigue. She states that she started synthroid today for hypothyroidism. TSH 2 weeks ago was 5.832.  She is doing well on Eliquis and has had no episodes of bleeding. She bruises easily.  No fever, chills, n/v, cough, rash, dizziness, SOB, chest pain, palpitations, abdominal pain or changes in bowel or bladder habits.  The swelling in her lower extremities seems to be a little better since her Zaroxolyn was increased to weekly. No numbness or tingling in her extremities at this time.  No lymphadenopathy noted on exam.  She has a good appetite and is staying hydrated. Her weight is stable.   ECOG Performance Status: 2 - Symptomatic, <50% confined to bed  Medications:  Allergies as of 06/25/2018      Reactions   Ace Inhibitors Cough   Clindamycin Rash      Medication List        Accurate as of 06/25/18  7:09 PM. Always use your most recent med list.          acetaminophen 325 MG tablet Commonly known as:  TYLENOL Take 325 mg by mouth daily as needed for mild pain.   albuterol (2.5 MG/3ML) 0.083% nebulizer solution Commonly known as:  PROVENTIL Take 2.5 mg by nebulization every 6 (six) hours as needed for wheezing or shortness of breath.   albuterol 108 (90 Base)  MCG/ACT inhaler Commonly known as:  PROVENTIL HFA;VENTOLIN HFA Inhale 2 puffs into the lungs every 6 (six) hours as needed for wheezing or shortness of breath.   allopurinol 100 MG tablet Commonly known as:  ZYLOPRIM Take 1 tablet (100 mg total) by mouth daily.   amiodarone 200 MG tablet Commonly known as:  PACERONE Take 1 tablet (200 mg total) by mouth daily.   apixaban 5 MG Tabs tablet Commonly known as:  ELIQUIS Take 1 tablet (5 mg total) by mouth 2 (two) times daily.   calcium-vitamin D 500-200 MG-UNIT tablet Commonly known as:  OSCAL WITH D Take 1 tablet by mouth daily with breakfast.   DIABETIC TUSSIN EX 100 MG/5ML liquid Generic drug:  guaiFENesin Take 200 mg by mouth 3 (three) times daily as needed for cough.   diltiazem 180 MG 24 hr capsule Commonly known as:  CARDIZEM CD Take 1 capsule (180 mg total) by mouth daily.   ezetimibe-simvastatin 10-40 MG tablet Commonly known as:  VYTORIN Take 1 tablet by mouth at bedtime.   ferrous sulfate 325 (65 FE) MG tablet Take 325 mg by mouth 2 (two) times daily with a meal.   furosemide 80 MG tablet Commonly known as:  LASIX Take 2 tablets (160 mg total) by mouth 2 (two) times daily. MAKE SURE TO TAKE AT 8 AM AND 2 PM   insulin aspart  100 UNIT/ML injection Commonly known as:  novoLOG Inject 0-9 Units into the skin 3 (three) times daily with meals. Correction factor Sliding scale CBG 70 - 120: 0 units CBG 121 - 150: 1 unit,  CBG 151 - 200: 2 units,  CBG 201 - 250: 3 units,  CBG 251 - 300: 5 units,  CBG 301 - 350: 7 units,  CBG 351 - 400: 9 units   CBG > 400: 9 units and notify your MD   insulin NPH Human 100 UNIT/ML injection Commonly known as:  HUMULIN N,NOVOLIN N Inject 20 Units into the skin 2 (two) times daily before a meal.   levothyroxine 25 MCG tablet Commonly known as:  LEVOXYL Take 1 tablet (25 mcg total) by mouth daily before breakfast.   metolazone 2.5 MG tablet Commonly known as:  ZAROXOLYN Take 1 tablet  every Wednesday   metoprolol tartrate 50 MG tablet Commonly known as:  LOPRESSOR Take 1 tablet (50 mg total) by mouth 2 (two) times daily.   MULTIVITAMIN/IRON PO Take 1 tablet by mouth daily.   omeprazole 20 MG capsule Commonly known as:  PRILOSEC Take 20 mg by mouth daily.   ondansetron 4 MG tablet Commonly known as:  ZOFRAN every 8 (eight) hours as needed.   OXYGEN Inhale 4 L into the lungs continuous.   potassium chloride SA 20 MEQ tablet Commonly known as:  K-DUR,KLOR-CON Take 20 mEq by mouth 3 (three) times daily. MAKE SURE TO TAKE EXTRA 20 MEQ ON METOLAZONE DAYS   Tiotropium Bromide-Olodaterol 2.5-2.5 MCG/ACT Aers Commonly known as:  STIOLTO RESPIMAT Inhale 2 puffs into the lungs daily.   vitamin B-12 500 MCG tablet Commonly known as:  CYANOCOBALAMIN Take 500 mcg by mouth daily.   Vitamin C 500 MG Caps Take 500 mg by mouth daily.   Vitamin D 2000 units tablet Take 2,000 Units by mouth daily.       Allergies:  Allergies  Allergen Reactions  . Ace Inhibitors Cough  . Clindamycin Rash    Past Medical History, Surgical history, Social history, and Family History were reviewed and updated.  Review of Systems: All other 10 point review of systems is negative.   Physical Exam:  weight is 299 lb 12.8 oz (136 kg). Her temperature is 97.2 F (36.2 C) (abnormal). Her blood pressure is 109/64 and her pulse is 64. Her oxygen saturation is 93%.   Wt Readings from Last 3 Encounters:  06/25/18 299 lb 12.8 oz (136 kg)  06/11/18 (!) 305 lb 12.8 oz (138.7 kg)  05/21/18 (!) 304 lb (137.9 kg)    Ocular: Sclerae unicteric, pupils equal, round and reactive to light Ear-nose-throat: Oropharynx clear, dentition fair Lymphatic: No cervical, supraclavicular or axillary adenopathy Lungs no rales or rhonchi, good excursion bilaterally Heart regular rate and rhythm, no murmur appreciated Abd soft, nontender, positive bowel sounds, no liver or spleen tip palpated on exam,  no fluid wave  MSK no focal spinal tenderness, no joint edema Neuro: non-focal, well-oriented, appropriate affect Breasts: Deferred   Lab Results  Component Value Date   WBC 9.6 06/25/2018   HGB 8.0 (L) 06/25/2018   HCT 28.0 (L) 06/25/2018   MCV 103.3 (H) 06/25/2018   PLT 210 06/25/2018   Lab Results  Component Value Date   FERRITIN 367 (H) 05/21/2018   IRON 40 (L) 05/21/2018   TIBC 264 05/21/2018   UIBC 224 05/21/2018   IRONPCTSAT 15 (L) 05/21/2018   Lab Results  Component Value Date   RETICCTPCT  4.0 (H) 06/25/2018   RBC 2.71 (L) 06/25/2018   RBC 2.74 (L) 06/25/2018   RETICCTABS 89.0 09/21/2015   No results found for: KPAFRELGTCHN, LAMBDASER, KAPLAMBRATIO No results found for: IGGSERUM, IGA, IGMSERUM No results found for: Odetta Pink, SPEI   Chemistry      Component Value Date/Time   NA 142 06/25/2018 1112   NA 141 10/16/2017 1106   NA 140 07/31/2017 1028   K 4.8 06/25/2018 1112   K 3.8 10/16/2017 1106   K 4.2 07/31/2017 1028   CL 92 (L) 06/25/2018 1112   CL 92 (L) 10/16/2017 1106   CO2 41 (H) 06/25/2018 1112   CO2 31 10/16/2017 1106   CO2 35 (H) 07/31/2017 1028   BUN 42 (H) 06/25/2018 1112   BUN 40 (H) 10/16/2017 1106   BUN 42.7 (H) 07/31/2017 1028   CREATININE 1.91 (H) 06/25/2018 1112   CREATININE 1.7 (H) 10/16/2017 1106   CREATININE 1.5 (H) 07/31/2017 1028      Component Value Date/Time   CALCIUM 10.0 06/25/2018 1112   CALCIUM 9.5 10/16/2017 1106   CALCIUM 10.1 07/31/2017 1028   ALKPHOS 69 06/25/2018 1112   ALKPHOS 68 10/16/2017 1106   ALKPHOS 69 07/31/2017 1028   AST 19 06/25/2018 1112   AST 14 07/31/2017 1028   ALT 12 06/25/2018 1112   ALT 15 10/16/2017 1106   ALT 14 07/31/2017 1028   BILITOT 0.5 06/25/2018 1112   BILITOT 0.54 07/31/2017 1028       Impression and Plan: Ms. Boutwell is a very pleasant 72 yo caucasian female with both iron deficiency anemia and anemia of chronic renal  failure.  Hgb is 8.0 today. She is feeling better since getting out of the hospital last month.  We will give her Aranesp today.  We will then see what her iron studies show and bring her back next week for an infusion if needed.  We will plan to see her back in another month for follow-up.  They will contact our office with any questions or concerns. We can certainly see her sooner if need be.   Laverna Peace, NP 7/30/20197:09 PM

## 2018-06-26 LAB — IRON AND TIBC
Iron: 43 ug/dL (ref 41–142)
Saturation Ratios: 16 % — ABNORMAL LOW (ref 21–57)
TIBC: 261 ug/dL (ref 236–444)
UIBC: 218 ug/dL

## 2018-06-26 LAB — FERRITIN: Ferritin: 280 ng/mL (ref 11–307)

## 2018-07-01 ENCOUNTER — Telehealth (HOSPITAL_COMMUNITY): Payer: Self-pay | Admitting: *Deleted

## 2018-07-01 ENCOUNTER — Other Ambulatory Visit (HOSPITAL_COMMUNITY): Payer: Self-pay | Admitting: Cardiology

## 2018-07-01 NOTE — Telephone Encounter (Signed)
Received daily weights from [facility], weights stable at 296 lbs.

## 2018-07-02 DIAGNOSIS — I509 Heart failure, unspecified: Secondary | ICD-10-CM | POA: Diagnosis not present

## 2018-07-02 DIAGNOSIS — I130 Hypertensive heart and chronic kidney disease with heart failure and stage 1 through stage 4 chronic kidney disease, or unspecified chronic kidney disease: Secondary | ICD-10-CM | POA: Diagnosis not present

## 2018-07-02 DIAGNOSIS — E039 Hypothyroidism, unspecified: Secondary | ICD-10-CM | POA: Diagnosis not present

## 2018-07-02 DIAGNOSIS — N183 Chronic kidney disease, stage 3 (moderate): Secondary | ICD-10-CM | POA: Diagnosis not present

## 2018-07-02 DIAGNOSIS — E1122 Type 2 diabetes mellitus with diabetic chronic kidney disease: Secondary | ICD-10-CM | POA: Diagnosis not present

## 2018-07-02 NOTE — ED Provider Notes (Signed)
[facility] Provider Note   CSN: [id number] Arrival date & time: 05/21/18  1315     History   Chief Complaint Chief Complaint  Patient presents with  . Rectal Bleeding    HPI Paige Hardin is a 72 y.o. female.  HPI   Pt is here with acute on chronic anemia. Pt has noted increasing fatigue.  Seen by Dr. Jonette Eva and found to have decreasing Hgb. He believes that it pt requires admission at this time.  Pt requires 4 L of O2 at baseline.     Past Medical History:  Diagnosis Date  . Arthritis    "joints" (01/14/2014)  . Carotid atherosclerosis   . Chronic low back pain   . COPD (chronic obstructive pulmonary disease) ([place])   . Depression    "maybe" (01/14/2014  . Diastolic congestive heart failure ([place])   . Emphysema   . Excessive daytime sleepiness 09/08/2015  . GERD (gastroesophageal reflux disease)   . Hoarseness, chronic   . Hyperlipidemia   . Lung nodules   . Obese   . Paroxysmal atrial fibrillation (H[facility])    a. failed flecainide;  b. 03/2014 amio started->DCCV;  c. Chronic pradaxa.  . Pneumonia 1990's   "once"  . Type 2 diabetes mellitus [facility])     Patient Active Problem List   Diagnosis Date Noted  . GI bleeding 05/21/2018  . Anemia of chronic renal failure, stage 3 (moderate) ([place]) 03/27/2017  . CHF (congestive heart failure) ([place]) 10/17/2016  . UTI (urinary tract infection) 10/17/2016  . Solitary pulmonary nodule 12/23/2015  . Type 2 diabetes mellitus with hyperglycemia ([place]) 11/16/2015  . Hyperlipidemia 11/16/2015  . Excessive daytime sleepiness 09/08/2015  . Iron deficiency anemia 03/29/2014  . Altered mental status 02/24/2014  . HTN (hypertension) 01/21/2014  . Chronic anticoagulation 01/21/2014  . Pulmonary HTN- pa 53 mmHg Echo 01/15/14 01/21/2014  . Morbid obesity- BMI 50 01/21/2014  . Poorly controlled diabetes mellitus ([place]) 01/21/2014  . Chronic renal disease, stage III ([place]) 01/21/2014  . Chronic  diastolic heart failure ([place]) 01/14/2014  . CHEST PAIN 09/26/2010  . HYPERLIPIDEMIA-MIXED 05/14/2009  . HOARSENESS 05/14/2009  . CAROTID BRUIT 05/14/2009  . COPD with emphysema  05/05/2009  . PAF (paroxysmal atrial fibrillation) s/p multiple DCCVs; on Amiodarone 05/05/2009    Past Surgical History:  Procedure Laterality Date  . BREAST BIOPSY Left ~ 1980   "solid"  . BREAST LUMPECTOMY Left ~ 1980   "removed a mass; it was benign" (01/14/2014)  . CARDIOVERSION N/A 03/11/2014   Procedure: CARDIOVERSION;  Surgeon: Sanda [facility], MD;  Location: [facility];  Service: Cardiovascular;  Laterality: N/A;  . CARDIOVERSION N/A 03/27/2014   Procedure: CARDIOVERSION;  Surgeon: Darlin Coco, MD;  Location: [facility];  Service: Cardiovascular;  Laterality: N/A;  . COLONOSCOPY  01/26/2012   Procedure: COLONOSCOPY;  Surgeon: Beryle Beams, MD;  Location: [facility];  Service: Endoscopy;  Laterality: N/A;  . ESOPHAGOGASTRODUODENOSCOPY  01/26/2012   Procedure: ESOPHAGOGASTRODUODENOSCOPY (EGD);  Surgeon: Beryle Beams, MD;  Location: [facility];  Service: Endoscopy;  Laterality: N/A;  . TOTAL ABDOMINAL HYSTERECTOMY  1994     OB History   None      Home Medications    Prior to Admission medications   Medication Sig Start Date End Date Taking? Authorizing Provider  allopurinol (ZYLOPRIM) 100 MG tablet Take 1 tablet (100 mg total) by mouth daily. 10/16/17  Yes [place], Holli Humbles, NP  amiodarone (PACERONE) 200 MG tablet Take  1 tablet (200 mg total) by mouth daily. 04/23/14  Yes Weaver, Scott T, PA-C  apixaban (ELIQUIS) 5 MG TABS tablet Take 1 tablet (5 mg total) by mouth 2 (two) times daily. 04/03/14  Yes Minor, Grace Bushy, NP  Ascorbic Acid (VITAMIN C) 500 MG CAPS Take 500 mg by mouth daily.    Yes [provider]  calcium-vitamin D (OSCAL WITH D) 500-200 MG-UNIT per tablet Take 1 tablet by mouth daily with breakfast.   Yes [provider]  Cholecalciferol (VITAMIN D) 2000 UNITS tablet Take  2,000 Units by mouth daily.   Yes [provider]  diltiazem (CARDIZEM CD) 180 MG 24 hr capsule Take 1 capsule (180 mg total) by mouth daily. 03/12/14  Yes Edmisten, Azzie Roup, PA-C  ezetimibe-simvastatin (VYTORIN) 10-40 MG per tablet Take 1 tablet by mouth at bedtime.    Yes [provider]  furosemide (LASIX) 80 MG tablet Take 2 tablets (160 mg total) by mouth 2 (two) times daily. MAKE SURE TO TAKE AT 8 AM AND 2 PM 04/23/14  Yes Kathlen Mody, Scott T, PA-C  insulin NPH Human (HUMULIN N,NOVOLIN N) 100 UNIT/ML injection Inject 20 Units into the skin 2 (two) times daily before a meal.  04/04/16  Yes [provider]  metoprolol (LOPRESSOR) 50 MG tablet Take 1 tablet (50 mg total) by mouth 2 (two) times daily. 03/12/14  Yes Edmisten, Azzie Roup, PA-C  Multiple Vitamins-Iron (MULTIVITAMIN/IRON PO) Take 1 tablet by mouth daily.   Yes [provider]  omeprazole (PRILOSEC) 20 MG capsule Take 20 mg by mouth daily.   Yes [provider]  potassium chloride SA (K-DUR,KLOR-CON) 20 MEQ tablet Take 20 mEq by mouth 3 (three) times daily. MAKE SURE TO TAKE EXTRA 20 MEQ ON METOLAZONE DAYS  04/23/14  Yes Richardson Dopp T, PA-C  Tiotropium Bromide-Olodaterol (STIOLTO RESPIMAT) 2.5-2.5 MCG/ACT AERS Inhale 2 puffs into the lungs daily. 10/26/17  Yes Collene Gobble, MD  vitamin B-12 (CYANOCOBALAMIN) 500 MCG tablet Take 500 mcg by mouth daily.   Yes [provider]  acetaminophen (TYLENOL) 325 MG tablet Take 325 mg by mouth daily as needed for mild pain.     [provider]  albuterol (PROVENTIL HFA;VENTOLIN HFA) 108 (90 Base) MCG/ACT inhaler Inhale 2 puffs into the lungs every 6 (six) hours as needed for wheezing or shortness of breath.    [provider]  albuterol (PROVENTIL) (2.5 MG/3ML) 0.083% nebulizer solution Take 2.5 mg by nebulization every 6 (six) hours as needed for wheezing or shortness of breath.    [provider]  ferrous sulfate 325 (65 FE)  MG tablet Take 325 mg by mouth 2 (two) times daily with a meal.     [provider]  guaiFENesin (DIABETIC TUSSIN EX) 100 MG/5ML liquid Take 200 mg by mouth 3 (three) times daily as needed for cough.    [provider]  insulin aspart (NOVOLOG) 100 UNIT/ML injection Inject 0-9 Units into the skin 3 (three) times daily with meals. Correction factor Sliding scale CBG 70 - 120: 0 units CBG 121 - 150: 1 unit,  CBG 151 - 200: 2 units,  CBG 201 - 250: 3 units,  CBG 251 - 300: 5 units,  CBG 301 - 350: 7 units,  CBG 351 - 400: 9 units   CBG > 400: 9 units and notify your MD 11/19/15   Rai, Vernelle Emerald, MD  levothyroxine (LEVOXYL) 25 MCG tablet Take 1 tablet (25 mcg total) by mouth daily  before breakfast. 06/24/18   Bensimhon, Shaune Pascal, MD  metolazone (ZAROXOLYN) 2.5 MG tablet Take 1 tablet every Wednesday 06/11/18   Larey Dresser, MD  ondansetron (ZOFRAN) 4 MG tablet every 8 (eight) hours as needed.  12/04/17   [provider]  OXYGEN Inhale 4 L into the lungs continuous.     [provider]    Family History Family History  Problem Relation Age of Onset  . Breast cancer Sister   . Heart disease Unknown   . Hodgkin's lymphoma Unknown   . Asthma Mother     Social History Social History   Tobacco Use  . Smoking status: Former Smoker    Packs/day: 3.00    Years: 45.00    Pack years: 135.00    Types: Cigarettes    Start date: 11/08/1963    Last attempt to quit: 12/28/2008    Years since quitting: 9.5  . Smokeless tobacco: Never Used  . Tobacco comment: quit smoking 5 years ago  Substance Use Topics  . Alcohol use: Yes    Alcohol/week: 0.0 oz    Comment: 01/14/2014 "used to drink socially in the past; last drink was probably 10 yr ago"  . Drug use: No     Allergies   Ace inhibitors and Clindamycin   Review of Systems Review of Systems  Constitutional: Positive for fatigue.  Respiratory: Positive for shortness of breath. Negative for cough.     Cardiovascular: Negative for chest pain.  All other systems reviewed and are negative.    Physical Exam Updated Vital Signs BP (!) 132/52 (BP Location: Right Wrist)   Pulse 60   Temp 98.1 F (36.7 C) (Oral)   Resp 18   Ht 5' 5.5" (1.664 m)   Wt (!) 137.9 kg (304 lb)   SpO2 91%   BMI 49.82 kg/m   Physical Exam  Constitutional: She is oriented to person, place, and time. She appears well-developed and well-nourished.  Chronically ill appearing on home 02  HENT:  Head: Normocephalic and atraumatic.  Eyes: Conjunctivae are normal. Right eye exhibits no discharge.  Neck: Neck supple.  Cardiovascular: Normal rate, regular rhythm and normal heart sounds.  No murmur heard. Pulmonary/Chest: Breath sounds normal. She has no wheezes. She has no rales.  tachypnic  Abdominal: Soft. She exhibits no distension. There is no tenderness.  Musculoskeletal: Normal range of motion. She exhibits no edema.  Neurological: She is oriented to person, place, and time. No cranial nerve deficit.  Skin: Skin is warm and dry. No rash noted. She is not diaphoretic.  Psychiatric: She has a normal mood and affect. Her behavior is normal.  Nursing note and vitals reviewed.    ED Treatments / Results  Labs (all labs ordered are listed, but only abnormal results are displayed) Labs Reviewed  PROTIME-INR - Abnormal; Notable for the following components:      Result Value   Prothrombin Time 20.8 (*)    All other components within normal limits  CBC - Abnormal; Notable for the following components:   RBC 2.44 (*)    Hemoglobin 7.5 (*)    HCT 25.6 (*)    MCV 104.9 (*)    MCHC 29.3 (*)    RDW 18.0 (*)    All other components within normal limits  CBC - Abnormal; Notable for the following components:   RBC 2.63 (*)    Hemoglobin 8.1 (*)    HCT 27.3 (*)    MCV 103.8 (*)  MCHC 29.7 (*)    RDW 18.6 (*)    All other components within normal limits  CBC - Abnormal; Notable for the following  components:   RBC 2.54 (*)    Hemoglobin 7.7 (*)    HCT 26.5 (*)    MCV 104.3 (*)    MCHC 29.1 (*)    RDW 18.4 (*)    All other components within normal limits  GLUCOSE, CAPILLARY - Abnormal; Notable for the following components:   Glucose-Capillary 120 (*)    All other components within normal limits  CBC - Abnormal; Notable for the following components:   RBC 2.79 (*)    Hemoglobin 8.4 (*)    HCT 28.6 (*)    MCV 102.5 (*)    MCHC 29.4 (*)    RDW 18.3 (*)    All other components within normal limits  GLUCOSE, CAPILLARY - Abnormal; Notable for the following components:   Glucose-Capillary 130 (*)    All other components within normal limits  GLUCOSE, CAPILLARY - Abnormal; Notable for the following components:   Glucose-Capillary 127 (*)    All other components within normal limits  CBC - Abnormal; Notable for the following components:   RBC 2.73 (*)    Hemoglobin 8.4 (*)    HCT 28.1 (*)    MCV 102.9 (*)    MCHC 29.9 (*)    RDW 18.0 (*)    All other components within normal limits  GLUCOSE, CAPILLARY - Abnormal; Notable for the following components:   Glucose-Capillary 126 (*)    All other components within normal limits  GLUCOSE, CAPILLARY - Abnormal; Notable for the following components:   Glucose-Capillary 132 (*)    All other components within normal limits  GLUCOSE, CAPILLARY - Abnormal; Notable for the following components:   Glucose-Capillary 144 (*)    All other components within normal limits  GLUCOSE, CAPILLARY - Abnormal; Notable for the following components:   Glucose-Capillary 206 (*)    All other components within normal limits  GLUCOSE, CAPILLARY - Abnormal; Notable for the following components:   Glucose-Capillary 145 (*)    All other components within normal limits  TROPONIN I  GLUCOSE, CAPILLARY  OCCULT BLOOD X 1 CARD TO LAB, STOOL  I-STAT CG4 LACTIC ACID, ED  CBG MONITORING, ED  PREPARE RBC (CROSSMATCH)  TYPE AND SCREEN  ABO/RH  PREPARE RBC  (CROSSMATCH)    EKG EKG Interpretation  Date/Time:  Tuesday May 21 2018 14:19:07 EDT Ventricular Rate:  63 PR Interval:    QRS Duration: 170 QT Interval:  485 QTC Calculation: 497 R Axis:   39 Text Interpretation:  Sinus or ectopic atrial rhythm Consider left atrial enlargement Right bundle branch block Baseline wander in lead(s) II III aVF Since last tracing, RBBB has progress Otherwise no significant change Confirmed by Duffy Bruce [phone]) on 05/22/2018 9:00:11 AM   Radiology No results found.  Procedures Procedures (including critical care time)  Medications Ordered in ED Medications  0.9 %  sodium chloride infusion (Manually program via Guardrails IV Fluids) ( Intravenous Given 05/21/18 2255)  pantoprazole (PROTONIX) 80 mg in sodium chloride 0.9 % 100 mL IVPB (0 mg Intravenous Stopped 05/21/18 1836)  0.9 %  sodium chloride infusion (Manually program via Guardrails IV Fluids) ( Intravenous Given 05/22/18 1758)     Initial Impression / Assessment and Plan / ED Course  I have reviewed the triage vital signs and the nursing notes.  Pertinent labs & imaging results that were available during  my care of the patient were reviewed by me and considered in my medical decision making (see chart for details).     Pt is here with acute on chronic anemia. Pt has noted increasing fatigue.  Seen by Dr. Jonette Eva and found to have decreasing Hgb. He believes that it pt requires admission at this time.  Pt requires 4 L of O2 at baseline  Will admit to hosptilist per [place].   Final Clinical Impressions(s) / ED Diagnoses   Final diagnoses:  None    ED Discharge Orders    None       Keeli Roberg, Fredia Sorrow, MD 07/02/18 1004

## 2018-07-03 DIAGNOSIS — M6281 Muscle weakness (generalized): Secondary | ICD-10-CM | POA: Diagnosis not present

## 2018-07-03 DIAGNOSIS — R2681 Unsteadiness on feet: Secondary | ICD-10-CM | POA: Diagnosis not present

## 2018-07-03 DIAGNOSIS — R262 Difficulty in walking, not elsewhere classified: Secondary | ICD-10-CM | POA: Diagnosis not present

## 2018-07-03 DIAGNOSIS — J441 Chronic obstructive pulmonary disease with (acute) exacerbation: Secondary | ICD-10-CM | POA: Diagnosis not present

## 2018-07-04 DIAGNOSIS — R262 Difficulty in walking, not elsewhere classified: Secondary | ICD-10-CM | POA: Diagnosis not present

## 2018-07-04 DIAGNOSIS — M6281 Muscle weakness (generalized): Secondary | ICD-10-CM | POA: Diagnosis not present

## 2018-07-04 DIAGNOSIS — R2681 Unsteadiness on feet: Secondary | ICD-10-CM | POA: Diagnosis not present

## 2018-07-04 DIAGNOSIS — J441 Chronic obstructive pulmonary disease with (acute) exacerbation: Secondary | ICD-10-CM | POA: Diagnosis not present

## 2018-07-05 DIAGNOSIS — R262 Difficulty in walking, not elsewhere classified: Secondary | ICD-10-CM | POA: Diagnosis not present

## 2018-07-05 DIAGNOSIS — R2681 Unsteadiness on feet: Secondary | ICD-10-CM | POA: Diagnosis not present

## 2018-07-05 DIAGNOSIS — J441 Chronic obstructive pulmonary disease with (acute) exacerbation: Secondary | ICD-10-CM | POA: Diagnosis not present

## 2018-07-05 DIAGNOSIS — M6281 Muscle weakness (generalized): Secondary | ICD-10-CM | POA: Diagnosis not present

## 2018-07-08 ENCOUNTER — Telehealth: Payer: Self-pay | Admitting: Hematology & Oncology

## 2018-07-08 DIAGNOSIS — R2681 Unsteadiness on feet: Secondary | ICD-10-CM | POA: Diagnosis not present

## 2018-07-08 DIAGNOSIS — R262 Difficulty in walking, not elsewhere classified: Secondary | ICD-10-CM | POA: Diagnosis not present

## 2018-07-08 DIAGNOSIS — J441 Chronic obstructive pulmonary disease with (acute) exacerbation: Secondary | ICD-10-CM | POA: Diagnosis not present

## 2018-07-08 DIAGNOSIS — M6281 Muscle weakness (generalized): Secondary | ICD-10-CM | POA: Diagnosis not present

## 2018-07-08 NOTE — Telephone Encounter (Signed)
lmom to sch appt for iron per sch msg

## 2018-07-10 DIAGNOSIS — J441 Chronic obstructive pulmonary disease with (acute) exacerbation: Secondary | ICD-10-CM | POA: Diagnosis not present

## 2018-07-10 DIAGNOSIS — R262 Difficulty in walking, not elsewhere classified: Secondary | ICD-10-CM | POA: Diagnosis not present

## 2018-07-10 DIAGNOSIS — M6281 Muscle weakness (generalized): Secondary | ICD-10-CM | POA: Diagnosis not present

## 2018-07-10 DIAGNOSIS — R2681 Unsteadiness on feet: Secondary | ICD-10-CM | POA: Diagnosis not present

## 2018-07-11 DIAGNOSIS — M6281 Muscle weakness (generalized): Secondary | ICD-10-CM | POA: Diagnosis not present

## 2018-07-11 DIAGNOSIS — J441 Chronic obstructive pulmonary disease with (acute) exacerbation: Secondary | ICD-10-CM | POA: Diagnosis not present

## 2018-07-11 DIAGNOSIS — R2681 Unsteadiness on feet: Secondary | ICD-10-CM | POA: Diagnosis not present

## 2018-07-11 DIAGNOSIS — R262 Difficulty in walking, not elsewhere classified: Secondary | ICD-10-CM | POA: Diagnosis not present

## 2018-07-17 DIAGNOSIS — J441 Chronic obstructive pulmonary disease with (acute) exacerbation: Secondary | ICD-10-CM | POA: Diagnosis not present

## 2018-07-17 DIAGNOSIS — R2681 Unsteadiness on feet: Secondary | ICD-10-CM | POA: Diagnosis not present

## 2018-07-17 DIAGNOSIS — R262 Difficulty in walking, not elsewhere classified: Secondary | ICD-10-CM | POA: Diagnosis not present

## 2018-07-17 DIAGNOSIS — M6281 Muscle weakness (generalized): Secondary | ICD-10-CM | POA: Diagnosis not present

## 2018-07-18 DIAGNOSIS — R262 Difficulty in walking, not elsewhere classified: Secondary | ICD-10-CM | POA: Diagnosis not present

## 2018-07-18 DIAGNOSIS — R2681 Unsteadiness on feet: Secondary | ICD-10-CM | POA: Diagnosis not present

## 2018-07-18 DIAGNOSIS — J441 Chronic obstructive pulmonary disease with (acute) exacerbation: Secondary | ICD-10-CM | POA: Diagnosis not present

## 2018-07-18 DIAGNOSIS — M6281 Muscle weakness (generalized): Secondary | ICD-10-CM | POA: Diagnosis not present

## 2018-07-23 ENCOUNTER — Inpatient Hospital Stay: Payer: Medicare Other

## 2018-07-23 ENCOUNTER — Other Ambulatory Visit: Payer: Self-pay

## 2018-07-23 ENCOUNTER — Telehealth: Payer: Self-pay | Admitting: *Deleted

## 2018-07-23 ENCOUNTER — Inpatient Hospital Stay: Payer: Medicare Other | Attending: Hematology & Oncology | Admitting: Family

## 2018-07-23 ENCOUNTER — Other Ambulatory Visit: Payer: Self-pay | Admitting: *Deleted

## 2018-07-23 VITALS — BP 101/31 | HR 59 | Temp 97.7°F | Resp 24 | Wt 303.1 lb

## 2018-07-23 VITALS — BP 108/61 | HR 61

## 2018-07-23 DIAGNOSIS — D649 Anemia, unspecified: Secondary | ICD-10-CM

## 2018-07-23 DIAGNOSIS — I480 Paroxysmal atrial fibrillation: Secondary | ICD-10-CM

## 2018-07-23 DIAGNOSIS — D508 Other iron deficiency anemias: Secondary | ICD-10-CM

## 2018-07-23 DIAGNOSIS — M7989 Other specified soft tissue disorders: Secondary | ICD-10-CM | POA: Diagnosis not present

## 2018-07-23 DIAGNOSIS — N183 Chronic kidney disease, stage 3 unspecified: Secondary | ICD-10-CM

## 2018-07-23 DIAGNOSIS — D631 Anemia in chronic kidney disease: Secondary | ICD-10-CM | POA: Diagnosis not present

## 2018-07-23 DIAGNOSIS — R5383 Other fatigue: Secondary | ICD-10-CM

## 2018-07-23 DIAGNOSIS — N189 Chronic kidney disease, unspecified: Secondary | ICD-10-CM | POA: Diagnosis present

## 2018-07-23 DIAGNOSIS — Z79899 Other long term (current) drug therapy: Secondary | ICD-10-CM

## 2018-07-23 DIAGNOSIS — D509 Iron deficiency anemia, unspecified: Secondary | ICD-10-CM | POA: Diagnosis not present

## 2018-07-23 DIAGNOSIS — E1122 Type 2 diabetes mellitus with diabetic chronic kidney disease: Secondary | ICD-10-CM

## 2018-07-23 DIAGNOSIS — R531 Weakness: Secondary | ICD-10-CM

## 2018-07-23 DIAGNOSIS — Z7901 Long term (current) use of anticoagulants: Secondary | ICD-10-CM | POA: Diagnosis not present

## 2018-07-23 DIAGNOSIS — Z9981 Dependence on supplemental oxygen: Secondary | ICD-10-CM

## 2018-07-23 DIAGNOSIS — R0602 Shortness of breath: Secondary | ICD-10-CM

## 2018-07-23 DIAGNOSIS — R042 Hemoptysis: Secondary | ICD-10-CM

## 2018-07-23 DIAGNOSIS — Z794 Long term (current) use of insulin: Secondary | ICD-10-CM | POA: Diagnosis not present

## 2018-07-23 DIAGNOSIS — E1121 Type 2 diabetes mellitus with diabetic nephropathy: Secondary | ICD-10-CM | POA: Diagnosis not present

## 2018-07-23 LAB — CBC WITH DIFFERENTIAL (CANCER CENTER ONLY)
BASOS ABS: 0 10*3/uL (ref 0.0–0.1)
Basophils Relative: 0 %
EOS ABS: 0.1 10*3/uL (ref 0.0–0.5)
Eosinophils Relative: 2 %
HCT: 25 % — ABNORMAL LOW (ref 34.8–46.6)
HEMOGLOBIN: 7 g/dL — AB (ref 11.6–15.9)
LYMPHS ABS: 0.4 10*3/uL — AB (ref 0.9–3.3)
Lymphocytes Relative: 5 %
MCH: 28.8 pg (ref 26.0–34.0)
MCHC: 27.6 g/dL — ABNORMAL LOW (ref 32.0–36.0)
MCV: 104.2 fL — ABNORMAL HIGH (ref 81.0–101.0)
Monocytes Absolute: 0.7 10*3/uL (ref 0.1–0.9)
Monocytes Relative: 8 %
NEUTROS PCT: 85 %
Neutro Abs: 7.9 10*3/uL — ABNORMAL HIGH (ref 1.5–6.5)
PLATELETS: 211 10*3/uL (ref 145–400)
RBC: 2.4 MIL/uL — AB (ref 3.70–5.32)
RDW: 18.8 % — ABNORMAL HIGH (ref 11.1–15.7)
WBC: 9.2 10*3/uL (ref 3.9–10.0)

## 2018-07-23 LAB — RETICULOCYTES
RBC.: 2.45 MIL/uL — ABNORMAL LOW (ref 3.70–5.45)
RETIC CT PCT: 7.3 % — AB (ref 0.7–2.1)
Retic Count, Absolute: 178.9 10*3/uL — ABNORMAL HIGH (ref 33.7–90.7)

## 2018-07-23 LAB — PREPARE RBC (CROSSMATCH)

## 2018-07-23 LAB — CMP (CANCER CENTER ONLY)
ALBUMIN: 3.2 g/dL — AB (ref 3.5–5.0)
ALK PHOS: 49 U/L (ref 26–84)
ALT: 14 U/L (ref 10–47)
AST: 23 U/L (ref 11–38)
Anion gap: 10 (ref 5–15)
BUN: 53 mg/dL — AB (ref 7–22)
CALCIUM: 9.4 mg/dL (ref 8.0–10.3)
CO2: 37 mmol/L — ABNORMAL HIGH (ref 18–33)
CREATININE: 2.1 mg/dL — AB (ref 0.60–1.20)
Chloride: 92 mmol/L — ABNORMAL LOW (ref 98–108)
GLUCOSE: 61 mg/dL — AB (ref 73–118)
Potassium: 4.3 mmol/L (ref 3.3–4.7)
SODIUM: 139 mmol/L (ref 128–145)
TOTAL PROTEIN: 6.6 g/dL (ref 6.4–8.1)
Total Bilirubin: 0.6 mg/dL (ref 0.2–1.6)

## 2018-07-23 LAB — SAMPLE TO BLOOD BANK

## 2018-07-23 LAB — ABO/RH: ABO/RH(D): A POS

## 2018-07-23 MED ORDER — SODIUM CHLORIDE 0.9 % IV SOLN
750.0000 mg | Freq: Once | INTRAVENOUS | Status: AC
  Administered 2018-07-23: 750 mg via INTRAVENOUS
  Filled 2018-07-23: qty 15

## 2018-07-23 MED ORDER — DARBEPOETIN ALFA 500 MCG/ML IJ SOSY
PREFILLED_SYRINGE | INTRAMUSCULAR | Status: AC
  Filled 2018-07-23: qty 1

## 2018-07-23 MED ORDER — SODIUM CHLORIDE 0.9 % IV SOLN
Freq: Once | INTRAVENOUS | Status: AC
  Administered 2018-07-23: 13:00:00 via INTRAVENOUS
  Filled 2018-07-23: qty 250

## 2018-07-23 NOTE — Telephone Encounter (Signed)
Critical Value Hgb 7.0 Laverna Peace NP notified. Order will be placed.

## 2018-07-23 NOTE — Patient Instructions (Signed)
Ferric carboxymaltose injection What is this medicine? FERRIC CARBOXYMALTOSE (ferr-ik car-box-ee-mol-toes) is an iron complex. Iron is used to make healthy red blood cells, which carry oxygen and nutrients throughout the body. This medicine is used to treat anemia in people with chronic kidney disease or people who cannot take iron by mouth. This medicine may be used for other purposes; ask your health care provider or pharmacist if you have questions. COMMON BRAND NAME(S): Injectafer What should I tell my health care provider before I take this medicine? They need to know if you have any of these conditions: -anemia not caused by low iron levels -high levels of iron in the blood -liver disease -an unusual or allergic reaction to iron, other medicines, foods, dyes, or preservatives -pregnant or trying to get pregnant -breast-feeding How should I use this medicine? This medicine is for infusion into a vein. It is given by a health care professional in a hospital or clinic setting. Talk to your pediatrician regarding the use of this medicine in children. Special care may be needed. Overdosage: If you think you have taken too much of this medicine contact a poison control center or emergency room at once. NOTE: This medicine is only for you. Do not share this medicine with others. What if I miss a dose? It is important not to miss your dose. Call your doctor or health care professional if you are unable to keep an appointment. What may interact with this medicine? Do not take this medicine with any of the following medications: -deferoxamine -dimercaprol -other iron products This medicine may also interact with the following medications: -chloramphenicol -deferasirox This list may not describe all possible interactions. Give your health care provider a list of all the medicines, herbs, non-prescription drugs, or dietary supplements you use. Also tell them if you smoke, drink alcohol, or use  illegal drugs. Some items may interact with your medicine. What should I watch for while using this medicine? Visit your doctor or health care professional regularly. Tell your doctor if your symptoms do not start to get better or if they get worse. You may need blood work done while you are taking this medicine. You may need to follow a special diet. Talk to your doctor. Foods that contain iron include: whole grains/cereals, dried fruits, beans, or peas, leafy green vegetables, and organ meats (liver, kidney). What side effects may I notice from receiving this medicine? Side effects that you should report to your doctor or health care professional as soon as possible: -allergic reactions like skin rash, itching or hives, swelling of the face, lips, or tongue -breathing problems -changes in blood pressure -feeling faint or lightheaded, falls -flushing, sweating, or hot feelings Side effects that usually do not require medical attention (report to your doctor or health care professional if they continue or are bothersome): -changes in taste -constipation -dizziness -headache -nausea -pain, redness, or irritation at site where injected -vomiting This list may not describe all possible side effects. Call your doctor for medical advice about side effects. You may report side effects to FDA at 1-800-FDA-1088. Where should I keep my medicine? This drug is given in a hospital or clinic and will not be stored at home. NOTE: This sheet is a summary. It may not cover all possible information. If you have questions about this medicine, talk to your doctor, pharmacist, or health care provider.  2018 Elsevier/Gold Standard (2015-12-16 11:20:47)  

## 2018-07-23 NOTE — Progress Notes (Signed)
Hematology and Oncology Follow Up Visit  Paige Hardin [id number] October 01, 1946 72 y.o. 07/23/2018   Principle Diagnosis:  Anemia secondary to iron deficiency  Anemia secondary to renal insufficiency  Chronic anticoagulation for paroxysmal atrial fibrillation  Current Therapy:   IV iron as indicated - last received in October 2018 Aranesp 300 mcg subcutaneous as needed for hemoglobin less than 11  ELIQUIS - life long   Interim History:  Paige Hardin is here today with her sister for follow-up. She is symptomatic with fatigue, weakness and increased SOB.  She is on 4L [place] supplemental O2 24 hours a day.  Hgb is down to 7.0 from 8.0. They held off on coming back in for IV iron since she had a follow-up appointment already scheduled.  She denies having any episodes of bleeding. No blood in her stool. Her skin is thin and she bruises easily.  She does not have an appetite and states that she is not eating well. She hydrated as well as she can on fluid restriction. Her weight is stable.  She has had a headache as well as neck and back pain. She thinks she strained her back and neck during PT recently.  No fever, chills, rash, dizziness, chest pain, palpitations, abdominal pain or changes in bowel or bladder habits.  The swelling in her lower extremities looks a little improved. She is taking her diuretics as prescribed.  She had some nausea (no vomiting) after starting synthroid but since her doctor told her to take it with a snack she has not had any problems.  She has occasional constipation and will drink prune juice as needed.  The neuropathy in her feet is stable. Her blood sugars are not well controlled.    ECOG Performance Status: 2 - Symptomatic, <50% confined to bed  Medications:  Allergies as of 07/23/2018      Reactions   Ace Inhibitors Cough   Clindamycin Rash      Medication List        Accurate as of 07/23/18 12:13 PM. Always use your most recent med list.            acetaminophen 325 MG tablet Commonly known as:  TYLENOL Take 325 mg by mouth daily as needed for mild pain.   albuterol (2.5 MG/3ML) 0.083% nebulizer solution Commonly known as:  PROVENTIL Take 2.5 mg by nebulization every 6 (six) hours as needed for wheezing or shortness of breath.   albuterol 108 (90 Base) MCG/ACT inhaler Commonly known as:  PROVENTIL HFA;VENTOLIN HFA Inhale 2 puffs into the lungs every 6 (six) hours as needed for wheezing or shortness of breath.   allopurinol 100 MG tablet Commonly known as:  ZYLOPRIM Take 1 tablet (100 mg total) by mouth daily.   amiodarone 200 MG tablet Commonly known as:  PACERONE Take 1 tablet (200 mg total) by mouth daily.   apixaban 5 MG Tabs tablet Commonly known as:  ELIQUIS Take 1 tablet (5 mg total) by mouth 2 (two) times daily.   calcium-vitamin D 500-200 MG-UNIT tablet Commonly known as:  OSCAL WITH D Take 1 tablet by mouth daily with breakfast.   DIABETIC TUSSIN EX 100 MG/5ML liquid Generic drug:  guaiFENesin Take 200 mg by mouth 3 (three) times daily as needed for cough.   diltiazem 180 MG 24 hr capsule Commonly known as:  CARDIZEM CD Take 1 capsule (180 mg total) by mouth daily.   ezetimibe-simvastatin 10-40 MG tablet Commonly known as:  VYTORIN Take 1 tablet  by mouth at bedtime.   ferrous sulfate 325 (65 FE) MG tablet Take 325 mg by mouth 2 (two) times daily with a meal.   furosemide 80 MG tablet Commonly known as:  LASIX Take 2 tablets (160 mg total) by mouth 2 (two) times daily. MAKE SURE TO TAKE AT 8 AM AND 2 PM   insulin aspart 100 UNIT/ML injection Commonly known as:  novoLOG Inject 0-9 Units into the skin 3 (three) times daily with meals. Correction factor Sliding scale CBG 70 - 120: 0 units CBG 121 - 150: 1 unit,  CBG 151 - 200: 2 units,  CBG 201 - 250: 3 units,  CBG 251 - 300: 5 units,  CBG 301 - 350: 7 units,  CBG 351 - 400: 9 units   CBG > 400: 9 units and notify your MD   insulin NPH Human 100  UNIT/ML injection Commonly known as:  HUMULIN N,NOVOLIN N Inject 20 Units into the skin 2 (two) times daily before a meal.   levothyroxine 25 MCG tablet Commonly known as:  SYNTHROID, LEVOTHROID Take 1 tablet (25 mcg total) by mouth daily before breakfast.   metolazone 2.5 MG tablet Commonly known as:  ZAROXOLYN Take 1 tablet every Wednesday   metoprolol tartrate 50 MG tablet Commonly known as:  LOPRESSOR Take 1 tablet (50 mg total) by mouth 2 (two) times daily.   MULTIVITAMIN/IRON PO Take 1 tablet by mouth daily.   omeprazole 20 MG capsule Commonly known as:  PRILOSEC Take 20 mg by mouth daily.   ondansetron 4 MG tablet Commonly known as:  ZOFRAN every 8 (eight) hours as needed.   OXYGEN Inhale 4 L into the lungs continuous.   potassium chloride SA 20 MEQ tablet Commonly known as:  K-DUR,KLOR-CON Take 20 mEq by mouth 3 (three) times daily. MAKE SURE TO TAKE EXTRA 20 MEQ ON METOLAZONE DAYS   Tiotropium Bromide-Olodaterol 2.5-2.5 MCG/ACT Aers Inhale 2 puffs into the lungs daily.   vitamin B-12 500 MCG tablet Commonly known as:  CYANOCOBALAMIN Take 500 mcg by mouth daily.   Vitamin C 500 MG Caps Take 500 mg by mouth daily.   Vitamin D 2000 units tablet Take 2,000 Units by mouth daily.       Allergies:  Allergies  Allergen Reactions  . Ace Inhibitors Cough  . Clindamycin Rash    Past Medical History, Surgical history, Social history, and Family History were reviewed and updated.  Review of Systems: All other 10 point review of systems is negative.   Physical Exam:  weight is 303 lb 2 oz (137.5 kg) (abnormal). Her oral temperature is 97.7 F (36.5 C). Her blood pressure is 101/31 (abnormal) and her pulse is 59 (abnormal). Her respiration is 24 (abnormal) and oxygen saturation is 95%.   Wt Readings from Last 3 Encounters:  07/23/18 (!) 303 lb 2 oz (137.5 kg)  06/25/18 299 lb 12.8 oz (136 kg)  06/11/18 (!) 305 lb 12.8 oz (138.7 kg)    Ocular: Sclerae  unicteric, pupils equal, round and reactive to light Ear-nose-throat: Oropharynx clear, dentition fair Lymphatic: No cervical, supraclavicular or axillary adenopathy Lungs no rales or rhonchi, good excursion bilaterally Heart regular rate and rhythm, no murmur appreciated Abd soft, nontender, positive bowel sounds, no liver or spleen tip palpated on exam, no fluid wave  MSK no focal spinal tenderness, no joint edema Neuro: non-focal, well-oriented, appropriate affect Breasts: Deferred   Lab Results  Component Value Date   WBC 9.2 07/23/2018   HGB  7.0 (LL) 07/23/2018   HCT 25.0 (L) 07/23/2018   MCV 104.2 (H) 07/23/2018   PLT 211 07/23/2018   Lab Results  Component Value Date   FERRITIN 280 06/25/2018   IRON 43 06/25/2018   TIBC 261 06/25/2018   UIBC 218 06/25/2018   IRONPCTSAT 16 (L) 06/25/2018   Lab Results  Component Value Date   RETICCTPCT 4.0 (H) 06/25/2018   RBC 2.40 (L) 07/23/2018   RETICCTABS 89.0 09/21/2015   No results found for: KPAFRELGTCHN, LAMBDASER, KAPLAMBRATIO No results found for: IGGSERUM, IGA, IGMSERUM No results found for: Odetta Pink, SPEI   Chemistry      Component Value Date/Time   NA 139 07/23/2018 1103   NA 141 10/16/2017 1106   NA 140 07/31/2017 1028   K 4.3 07/23/2018 1103   K 3.8 10/16/2017 1106   K 4.2 07/31/2017 1028   CL 92 (L) 07/23/2018 1103   CL 92 (L) 10/16/2017 1106   CO2 37 (H) 07/23/2018 1103   CO2 31 10/16/2017 1106   CO2 35 (H) 07/31/2017 1028   BUN 53 (H) 07/23/2018 1103   BUN 40 (H) 10/16/2017 1106   BUN 42.7 (H) 07/31/2017 1028   CREATININE 2.10 (H) 07/23/2018 1103   CREATININE 1.7 (H) 10/16/2017 1106   CREATININE 1.5 (H) 07/31/2017 1028      Component Value Date/Time   CALCIUM 9.4 07/23/2018 1103   CALCIUM 9.5 10/16/2017 1106   CALCIUM 10.1 07/31/2017 1028   ALKPHOS 49 07/23/2018 1103   ALKPHOS 68 10/16/2017 1106   ALKPHOS 69 07/31/2017 1028   AST 23  07/23/2018 1103   AST 14 07/31/2017 1028   ALT 14 07/23/2018 1103   ALT 15 10/16/2017 1106   ALT 14 07/31/2017 1028   BILITOT 0.6 07/23/2018 1103   BILITOT 0.54 07/31/2017 1028      Impression and Plan: Paige Hardin is a very pleasant 72 yo caucasian female with both iron deficiency anemia and anemia of chronic renal failure. She is quite symptomatic as mentioned above with Hgb of 7.0.  We will give her IV iron today.  I spoke with Dr. Marin Olp and we will also give her 2 units of blood tomorrow starting at 9 am.  We will then plan to see her back in another 4 weeks for follow-up.  They will contact our office with any questions or concerns. We can certainly see her sooner if need be.    Paige Peace, NP 8/27/201912:13 PM

## 2018-07-24 ENCOUNTER — Other Ambulatory Visit: Payer: Self-pay

## 2018-07-24 ENCOUNTER — Inpatient Hospital Stay: Payer: Medicare Other

## 2018-07-24 ENCOUNTER — Encounter: Payer: Self-pay | Admitting: Hematology & Oncology

## 2018-07-24 DIAGNOSIS — N183 Chronic kidney disease, stage 3 (moderate): Secondary | ICD-10-CM | POA: Diagnosis not present

## 2018-07-24 DIAGNOSIS — Z7901 Long term (current) use of anticoagulants: Secondary | ICD-10-CM | POA: Diagnosis not present

## 2018-07-24 DIAGNOSIS — D631 Anemia in chronic kidney disease: Secondary | ICD-10-CM | POA: Diagnosis not present

## 2018-07-24 DIAGNOSIS — R5383 Other fatigue: Secondary | ICD-10-CM | POA: Diagnosis not present

## 2018-07-24 DIAGNOSIS — I480 Paroxysmal atrial fibrillation: Secondary | ICD-10-CM | POA: Diagnosis not present

## 2018-07-24 DIAGNOSIS — D649 Anemia, unspecified: Secondary | ICD-10-CM

## 2018-07-24 DIAGNOSIS — D509 Iron deficiency anemia, unspecified: Secondary | ICD-10-CM | POA: Diagnosis not present

## 2018-07-24 DIAGNOSIS — D508 Other iron deficiency anemias: Secondary | ICD-10-CM

## 2018-07-24 LAB — IRON AND TIBC
Iron: 69 ug/dL (ref 41–142)
Saturation Ratios: 23 % (ref 21–57)
TIBC: 301 ug/dL (ref 236–444)
UIBC: 232 ug/dL

## 2018-07-24 LAB — FERRITIN: FERRITIN: 197 ng/mL (ref 11–307)

## 2018-07-24 MED ORDER — FUROSEMIDE 10 MG/ML IJ SOLN: 20.0000 mg | Freq: Once | INTRAMUSCULAR | Status: DC

## 2018-07-24 MED ORDER — DIPHENHYDRAMINE HCL 25 MG PO CAPS
ORAL_CAPSULE | ORAL | Status: AC
  Filled 2018-07-24: qty 1

## 2018-07-24 MED ORDER — DIPHENHYDRAMINE HCL 25 MG PO CAPS
25.0000 mg | ORAL_CAPSULE | Freq: Once | ORAL | Status: AC
  Administered 2018-07-24: 25 mg via ORAL

## 2018-07-24 MED ORDER — SODIUM CHLORIDE 0.9% IV SOLUTION
250.0000 mL | Freq: Once | INTRAVENOUS | Status: AC
  Administered 2018-07-24: 250 mL via INTRAVENOUS
  Filled 2018-07-24: qty 250

## 2018-07-24 MED ORDER — ACETAMINOPHEN 325 MG PO TABS
ORAL_TABLET | ORAL | Status: AC
  Filled 2018-07-24: qty 2

## 2018-07-24 MED ORDER — ACETAMINOPHEN 325 MG PO TABS
650.0000 mg | ORAL_TABLET | Freq: Once | ORAL | Status: AC
  Administered 2018-07-24: 650 mg via ORAL

## 2018-07-24 NOTE — Patient Instructions (Signed)
Blood Transfusion, Adult, Care After This sheet gives you information about how to care for yourself after your procedure. Your health care provider may also give you more specific instructions. If you have problems or questions, contact your health care provider. What can I expect after the procedure? After your procedure, it is common to have:  Bruising and soreness where the IV tube was inserted.  Headache.  Follow these instructions at home:  Take over-the-counter and prescription medicines only as told by your health care provider.  Return to your normal activities as told by your health care provider.  Follow instructions from your health care provider about how to take care of your IV insertion site. Make sure you: ? Wash your hands with soap and water before you change your bandage (dressing). If soap and water are not available, use hand sanitizer. ? Change your dressing as told by your health care provider.  Check your IV insertion site every day for signs of infection. Check for: ? More redness, swelling, or pain. ? More fluid or blood. ? Warmth. ? Pus or a bad smell. Contact a health care provider if:  You have more redness, swelling, or pain around the IV insertion site.  You have more fluid or blood coming from the IV insertion site.  Your IV insertion site feels warm to the touch.  You have pus or a bad smell coming from the IV insertion site.  Your urine turns pink, red, or brown.  You feel weak after doing your normal activities. Get help right away if:  You have signs of a serious allergic or immune system reaction, including: ? Itchiness. ? Hives. ? Trouble breathing. ? Anxiety. ? Chest or lower back pain. ? Fever, flushing, and chills. ? Rapid pulse. ? Rash. ? Diarrhea. ? Vomiting. ? Dark urine. ? Serious headache. ? Dizziness. ? Stiff neck. ? Yellow coloration of the face or the [place] parts of the eyes (jaundice). This information is not  intended to replace advice given to you by your health care provider. Make sure you discuss any questions you have with your health care provider. Document Released: 12/04/2014 Document Revised: 07/12/2016 Document Reviewed: 05/29/2016 Elsevier Interactive Patient Education  2018 Elsevier Inc.  

## 2018-07-25 ENCOUNTER — Encounter: Payer: Self-pay | Admitting: Hematology & Oncology

## 2018-07-25 LAB — BPAM RBC
Blood Product Expiration Date: 201909232359
Blood Product Expiration Date: 201909232359
ISSUE DATE / TIME: 201908280804
ISSUE DATE / TIME: 201908280804
UNIT TYPE AND RH: 6200
Unit Type and Rh: 6200

## 2018-07-25 LAB — TYPE AND SCREEN
ABO/RH(D): A POS
ANTIBODY SCREEN: NEGATIVE
Unit division: 0
Unit division: 0

## 2018-07-30 DIAGNOSIS — E039 Hypothyroidism, unspecified: Secondary | ICD-10-CM | POA: Diagnosis not present

## 2018-07-31 DIAGNOSIS — R2681 Unsteadiness on feet: Secondary | ICD-10-CM | POA: Diagnosis not present

## 2018-07-31 DIAGNOSIS — E119 Type 2 diabetes mellitus without complications: Secondary | ICD-10-CM | POA: Diagnosis not present

## 2018-07-31 DIAGNOSIS — R262 Difficulty in walking, not elsewhere classified: Secondary | ICD-10-CM | POA: Diagnosis not present

## 2018-07-31 DIAGNOSIS — B351 Tinea unguium: Secondary | ICD-10-CM | POA: Diagnosis not present

## 2018-07-31 DIAGNOSIS — J441 Chronic obstructive pulmonary disease with (acute) exacerbation: Secondary | ICD-10-CM | POA: Diagnosis not present

## 2018-07-31 DIAGNOSIS — M6281 Muscle weakness (generalized): Secondary | ICD-10-CM | POA: Diagnosis not present

## 2018-08-01 DIAGNOSIS — R2681 Unsteadiness on feet: Secondary | ICD-10-CM | POA: Diagnosis not present

## 2018-08-01 DIAGNOSIS — J441 Chronic obstructive pulmonary disease with (acute) exacerbation: Secondary | ICD-10-CM | POA: Diagnosis not present

## 2018-08-01 DIAGNOSIS — R262 Difficulty in walking, not elsewhere classified: Secondary | ICD-10-CM | POA: Diagnosis not present

## 2018-08-01 DIAGNOSIS — M6281 Muscle weakness (generalized): Secondary | ICD-10-CM | POA: Diagnosis not present

## 2018-08-05 DIAGNOSIS — J441 Chronic obstructive pulmonary disease with (acute) exacerbation: Secondary | ICD-10-CM | POA: Diagnosis not present

## 2018-08-05 DIAGNOSIS — M6281 Muscle weakness (generalized): Secondary | ICD-10-CM | POA: Diagnosis not present

## 2018-08-05 DIAGNOSIS — R262 Difficulty in walking, not elsewhere classified: Secondary | ICD-10-CM | POA: Diagnosis not present

## 2018-08-05 DIAGNOSIS — R2681 Unsteadiness on feet: Secondary | ICD-10-CM | POA: Diagnosis not present

## 2018-08-07 DIAGNOSIS — R262 Difficulty in walking, not elsewhere classified: Secondary | ICD-10-CM | POA: Diagnosis not present

## 2018-08-07 DIAGNOSIS — M6281 Muscle weakness (generalized): Secondary | ICD-10-CM | POA: Diagnosis not present

## 2018-08-07 DIAGNOSIS — J441 Chronic obstructive pulmonary disease with (acute) exacerbation: Secondary | ICD-10-CM | POA: Diagnosis not present

## 2018-08-07 DIAGNOSIS — R2681 Unsteadiness on feet: Secondary | ICD-10-CM | POA: Diagnosis not present

## 2018-08-08 DIAGNOSIS — R2681 Unsteadiness on feet: Secondary | ICD-10-CM | POA: Diagnosis not present

## 2018-08-08 DIAGNOSIS — M6281 Muscle weakness (generalized): Secondary | ICD-10-CM | POA: Diagnosis not present

## 2018-08-08 DIAGNOSIS — R262 Difficulty in walking, not elsewhere classified: Secondary | ICD-10-CM | POA: Diagnosis not present

## 2018-08-08 DIAGNOSIS — J441 Chronic obstructive pulmonary disease with (acute) exacerbation: Secondary | ICD-10-CM | POA: Diagnosis not present

## 2018-08-14 DIAGNOSIS — M6281 Muscle weakness (generalized): Secondary | ICD-10-CM | POA: Diagnosis not present

## 2018-08-14 DIAGNOSIS — R2681 Unsteadiness on feet: Secondary | ICD-10-CM | POA: Diagnosis not present

## 2018-08-14 DIAGNOSIS — J441 Chronic obstructive pulmonary disease with (acute) exacerbation: Secondary | ICD-10-CM | POA: Diagnosis not present

## 2018-08-14 DIAGNOSIS — R262 Difficulty in walking, not elsewhere classified: Secondary | ICD-10-CM | POA: Diagnosis not present

## 2018-08-15 DIAGNOSIS — M6281 Muscle weakness (generalized): Secondary | ICD-10-CM | POA: Diagnosis not present

## 2018-08-15 DIAGNOSIS — J441 Chronic obstructive pulmonary disease with (acute) exacerbation: Secondary | ICD-10-CM | POA: Diagnosis not present

## 2018-08-15 DIAGNOSIS — R262 Difficulty in walking, not elsewhere classified: Secondary | ICD-10-CM | POA: Diagnosis not present

## 2018-08-15 DIAGNOSIS — R2681 Unsteadiness on feet: Secondary | ICD-10-CM | POA: Diagnosis not present

## 2018-08-19 DIAGNOSIS — M6281 Muscle weakness (generalized): Secondary | ICD-10-CM | POA: Diagnosis not present

## 2018-08-19 DIAGNOSIS — R262 Difficulty in walking, not elsewhere classified: Secondary | ICD-10-CM | POA: Diagnosis not present

## 2018-08-19 DIAGNOSIS — R2681 Unsteadiness on feet: Secondary | ICD-10-CM | POA: Diagnosis not present

## 2018-08-19 DIAGNOSIS — J441 Chronic obstructive pulmonary disease with (acute) exacerbation: Secondary | ICD-10-CM | POA: Diagnosis not present

## 2018-08-20 ENCOUNTER — Other Ambulatory Visit: Payer: Self-pay

## 2018-08-20 ENCOUNTER — Inpatient Hospital Stay: Payer: Medicare Other

## 2018-08-20 ENCOUNTER — Encounter: Payer: Self-pay | Admitting: Hematology & Oncology

## 2018-08-20 ENCOUNTER — Inpatient Hospital Stay: Payer: Medicare Other | Attending: Hematology & Oncology | Admitting: Hematology & Oncology

## 2018-08-20 VITALS — BP 111/57 | HR 62 | Temp 98.1°F | Resp 20 | Wt 301.4 lb

## 2018-08-20 DIAGNOSIS — Z862 Personal history of diseases of the blood and blood-forming organs and certain disorders involving the immune mechanism: Secondary | ICD-10-CM | POA: Insufficient documentation

## 2018-08-20 DIAGNOSIS — N183 Chronic kidney disease, stage 3 (moderate): Secondary | ICD-10-CM | POA: Diagnosis not present

## 2018-08-20 DIAGNOSIS — Z79899 Other long term (current) drug therapy: Secondary | ICD-10-CM | POA: Diagnosis not present

## 2018-08-20 DIAGNOSIS — Z794 Long term (current) use of insulin: Secondary | ICD-10-CM | POA: Diagnosis not present

## 2018-08-20 DIAGNOSIS — Z23 Encounter for immunization: Secondary | ICD-10-CM | POA: Insufficient documentation

## 2018-08-20 DIAGNOSIS — D509 Iron deficiency anemia, unspecified: Secondary | ICD-10-CM | POA: Diagnosis not present

## 2018-08-20 DIAGNOSIS — E119 Type 2 diabetes mellitus without complications: Secondary | ICD-10-CM | POA: Diagnosis not present

## 2018-08-20 DIAGNOSIS — D500 Iron deficiency anemia secondary to blood loss (chronic): Secondary | ICD-10-CM

## 2018-08-20 DIAGNOSIS — I480 Paroxysmal atrial fibrillation: Secondary | ICD-10-CM

## 2018-08-20 DIAGNOSIS — D649 Anemia, unspecified: Secondary | ICD-10-CM

## 2018-08-20 DIAGNOSIS — D508 Other iron deficiency anemias: Secondary | ICD-10-CM

## 2018-08-20 DIAGNOSIS — K625 Hemorrhage of anus and rectum: Secondary | ICD-10-CM | POA: Insufficient documentation

## 2018-08-20 DIAGNOSIS — R042 Hemoptysis: Secondary | ICD-10-CM

## 2018-08-20 DIAGNOSIS — Z7901 Long term (current) use of anticoagulants: Secondary | ICD-10-CM

## 2018-08-20 DIAGNOSIS — Z9981 Dependence on supplemental oxygen: Secondary | ICD-10-CM | POA: Diagnosis not present

## 2018-08-20 DIAGNOSIS — D631 Anemia in chronic kidney disease: Secondary | ICD-10-CM

## 2018-08-20 LAB — CMP (CANCER CENTER ONLY)
ALBUMIN: 3.4 g/dL — AB (ref 3.5–5.0)
ALT: 22 U/L (ref 10–47)
AST: 21 U/L (ref 11–38)
Alkaline Phosphatase: 62 U/L (ref 26–84)
Anion gap: 8 (ref 5–15)
BUN: 47 mg/dL — AB (ref 7–22)
CALCIUM: 9.6 mg/dL (ref 8.0–10.3)
CHLORIDE: 100 mmol/L (ref 98–108)
CO2: 35 mmol/L — AB (ref 18–33)
CREATININE: 2.7 mg/dL — AB (ref 0.60–1.20)
GLUCOSE: 208 mg/dL — AB (ref 73–118)
Potassium: 4.6 mmol/L (ref 3.3–4.7)
SODIUM: 143 mmol/L (ref 128–145)
Total Bilirubin: 0.7 mg/dL (ref 0.2–1.6)
Total Protein: 7 g/dL (ref 6.4–8.1)

## 2018-08-20 LAB — CBC WITH DIFFERENTIAL (CANCER CENTER ONLY)
BASOS ABS: 0 10*3/uL (ref 0.0–0.1)
BASOS PCT: 0 %
EOS ABS: 0.3 10*3/uL (ref 0.0–0.5)
Eosinophils Relative: 3 %
HEMATOCRIT: 26.8 % — AB (ref 34.8–46.6)
Hemoglobin: 7.8 g/dL — ABNORMAL LOW (ref 11.6–15.9)
Lymphocytes Relative: 4 %
Lymphs Abs: 0.4 10*3/uL — ABNORMAL LOW (ref 0.9–3.3)
MCH: 29.5 pg (ref 26.0–34.0)
MCHC: 29.1 g/dL — AB (ref 32.0–36.0)
MCV: 101.5 fL — ABNORMAL HIGH (ref 81.0–101.0)
MONO ABS: 0.6 10*3/uL (ref 0.1–0.9)
MONOS PCT: 6 %
NEUTROS ABS: 8.4 10*3/uL — AB (ref 1.5–6.5)
NEUTROS PCT: 87 %
Platelet Count: 197 10*3/uL (ref 145–400)
RBC: 2.64 MIL/uL — ABNORMAL LOW (ref 3.70–5.32)
RDW: 18.7 % — AB (ref 11.1–15.7)
WBC Count: 9.7 10*3/uL (ref 3.9–10.0)

## 2018-08-20 LAB — SAMPLE TO BLOOD BANK

## 2018-08-20 LAB — RETICULOCYTES
RBC.: 2.64 MIL/uL — AB (ref 3.70–5.45)
Retic Count, Absolute: 97.7 10*3/uL — ABNORMAL HIGH (ref 33.7–90.7)
Retic Ct Pct: 3.7 % — ABNORMAL HIGH (ref 0.7–2.1)

## 2018-08-20 MED ORDER — INFLUENZA VAC SPLIT QUAD 0.5 ML IM SUSY
PREFILLED_SYRINGE | INTRAMUSCULAR | Status: AC
  Filled 2018-08-20: qty 0.5

## 2018-08-20 MED ORDER — DARBEPOETIN ALFA 500 MCG/ML IJ SOSY
500.0000 ug | PREFILLED_SYRINGE | Freq: Once | INTRAMUSCULAR | Status: AC
  Administered 2018-08-20: 500 ug via SUBCUTANEOUS

## 2018-08-20 MED ORDER — DARBEPOETIN ALFA 500 MCG/ML IJ SOSY
PREFILLED_SYRINGE | INTRAMUSCULAR | Status: AC
  Filled 2018-08-20: qty 1

## 2018-08-20 MED ORDER — INFLUENZA VAC SPLIT QUAD 0.5 ML IM SUSY
0.5000 mL | PREFILLED_SYRINGE | Freq: Once | INTRAMUSCULAR | Status: AC
  Administered 2018-08-20: 0.5 mL via INTRAMUSCULAR

## 2018-08-20 MED ORDER — DARBEPOETIN ALFA 300 MCG/0.6ML IJ SOSY
PREFILLED_SYRINGE | INTRAMUSCULAR | Status: AC
  Filled 2018-08-20: qty 0.6

## 2018-08-20 NOTE — Patient Instructions (Signed)
Darbepoetin Alfa injection What is this medicine? DARBEPOETIN ALFA (dar be POE e tin AL fa) helps your body make more red blood cells. It is used to treat anemia caused by chronic kidney failure and chemotherapy. This medicine may be used for other purposes; ask your health care provider or pharmacist if you have questions. COMMON BRAND NAME(S): Aranesp What should I tell my health care provider before I take this medicine? They need to know if you have any of these conditions: -blood clotting disorders or history of blood clots -cancer patient not on chemotherapy -cystic fibrosis -heart disease, such as angina, heart failure, or a history of a heart attack -hemoglobin level of 12 g/dL or greater -high blood pressure -low levels of folate, iron, or vitamin B12 -seizures -an unusual or allergic reaction to darbepoetin, erythropoietin, albumin, hamster proteins, latex, other medicines, foods, dyes, or preservatives -pregnant or trying to get pregnant -breast-feeding How should I use this medicine? This medicine is for injection into a vein or under the skin. It is usually given by a health care professional in a hospital or clinic setting. If you get this medicine at home, you will be taught how to prepare and give this medicine. Use exactly as directed. Take your medicine at regular intervals. Do not take your medicine more often than directed. It is important that you put your used needles and syringes in a special sharps container. Do not put them in a trash can. If you do not have a sharps container, call your pharmacist or healthcare provider to get one. A special MedGuide will be given to you by the pharmacist with each prescription and refill. Be sure to read this information carefully each time. Talk to your pediatrician regarding the use of this medicine in children. While this medicine may be used in children as young as 1 year for selected conditions, precautions do  apply. Overdosage: If you think you have taken too much of this medicine contact a poison control center or emergency room at once. NOTE: This medicine is only for you. Do not share this medicine with others. What if I miss a dose? If you miss a dose, take it as soon as you can. If it is almost time for your next dose, take only that dose. Do not take double or extra doses. What may interact with this medicine? Do not take this medicine with any of the following medications: -epoetin alfa This list may not describe all possible interactions. Give your health care provider a list of all the medicines, herbs, non-prescription drugs, or dietary supplements you use. Also tell them if you smoke, drink alcohol, or use illegal drugs. Some items may interact with your medicine. What should I watch for while using this medicine? Your condition will be monitored carefully while you are receiving this medicine. You may need blood work done while you are taking this medicine. What side effects may I notice from receiving this medicine? Side effects that you should report to your doctor or health care professional as soon as possible: -allergic reactions like skin rash, itching or hives, swelling of the face, lips, or tongue -breathing problems -changes in vision -chest pain -confusion, trouble speaking or understanding -feeling faint or lightheaded, falls -high blood pressure -muscle aches or pains -pain, swelling, warmth in the leg -rapid weight gain -severe headaches -sudden numbness or weakness of the face, arm or leg -trouble walking, dizziness, loss of balance or coordination -seizures (convulsions) -swelling of the ankles, feet, hands -  unusually weak or tired Side effects that usually do not require medical attention (report to your doctor or health care professional if they continue or are bothersome): -diarrhea -fever, chills (flu-like symptoms) -headaches -nausea, vomiting -redness,  stinging, or swelling at site where injected This list may not describe all possible side effects. Call your doctor for medical advice about side effects. You may report side effects to FDA at 1-800-FDA-1088. Where should I keep my medicine? Keep out of the reach of children. Store in a refrigerator between 2 and 8 degrees C (36 and 46 degrees F). Do not freeze. Do not shake. Throw away any unused portion if using a single-dose vial. Throw away any unused medicine after the expiration date. NOTE: This sheet is a summary. It may not cover all possible information. If you have questions about this medicine, talk to your doctor, pharmacist, or health care provider.  2018 Elsevier/Gold Standard (2016-07-03 19:52:26Hib Vaccine (Haemophilus Influenzae Type b): What You Need to Know 1. Why get vaccinated? Haemophilus influenzae type b (Hib) disease is a serious disease caused by bacteria. It usually affects children under 60 years old. It can also affect adults with certain medical conditions. Your child can get Hib disease by being around other children or adults who may have the bacteria and not know it. The germs spread from person to person. If the germs stay in the child's nose and throat, the child probably will not get sick. But sometimes the germs spread into the lungs or the bloodstream, and then Hib can cause serious problems. This is called invasive Hib disease. Before Hib vaccine, Hib disease was the leading cause of bacterial meningitis among children under 73 years old in the [place]. Meningitis is an infection of the lining of the brain and spinal cord. It can lead to brain damage and deafness. Hib disease can also cause:  pneumonia  severe swelling in the throat, making it hard to breathe  infections of the blood, joints, bones, and covering of the heart  death  Before Hib vaccine, about 20,000 children in the [place] under 61 years old got Hib disease each year, and about  3%-6% of them died. Hib vaccine can prevent Hib disease. Since use of Hib vaccine began, the number of cases of invasive Hib disease has decreased by more than 99%. Many more children would get Hib disease if we stopped vaccinating. 2. Hib vaccine Several different brands of Hib vaccine are available. Your child will receive either 3 or 4 doses, depending on which vaccine is used. Doses of Hib vaccine are usually recommended at these ages:  First dose: 23 months of age  Second dose: 86 months of age  Third dose: 23 months of age (if needed, depending on brand of vaccine)  Final/booster dose: 41-37 months of age  Hib vaccine may be given at the same time as other vaccines. Hib vaccine may be given as part of a combination vaccine. Combination vaccines are made when two or more types of vaccine are combined together into a single shot, so that one vaccination can protect against more than one disease. Children over 68 years old and adults usually do not need Hib vaccine. But it may be recommended for older children or adults with asplenia or sickle cell disease, before surgery to remove the spleen, or following a bone marrow transplant. It may also be recommended for people 65 to 73 years old with HIV. Ask your doctor for details. Your doctor or the  person giving you the vaccine can give you more information. 3. Some people should not get this vaccine Hib vaccine should not be given to infants younger than 24 weeks of age. A person who has ever had a life-threatening allergic reaction after a previous dose of Hib vaccine, OR has a severe allergy to any part of this vaccine, should not get Hib vaccine. Tell the person giving the vaccine about any severe allergies. People who are mildly ill can get Hib vaccine. People who are moderately or severely ill should probably wait until they recover. Talk to your healthcare provider if the person getting the vaccine isn't feeling well on the day the shot is  scheduled. 4. Risks of a vaccine reaction With any medicine, including vaccines, there is a chance of side effects. These are usually mild and go away on their own. Serious reactions are also possible but are rare. Most people who get Hib vaccine do not have any problems with it. Mild problems following Hib vaccine:  redness, warmth, or swelling where the shot was given  fever  These problems are uncommon. If they occur, they usually begin soon after the shot and last 2 or 3 days. Problems that could happen after any vaccine: Any medication can cause a severe allergic reaction. Such reactions from a vaccine are very rare, estimated at fewer than 1 in a million doses, and would happen within a few minutes to a few hours after the vaccination. As with any medicine, there is a very remote chance of a vaccine causing a serious injury or death. Older children, adolescents, and adults might also experience these problems after any vaccine:  People sometimes faint after a medical procedure, including vaccination. Sitting or lying down for about 15 minutes can help prevent fainting, and injuries caused by a fall. Tell your doctor if you feel dizzy, or have vision changes or ringing in the ears.  Some people get severe pain in the shoulder and have difficulty moving the arm where a shot was given. This happens very rarely.  The safety of vaccines is always being monitored. For more information, visit: http://www.aguilar.org/ 5. What if there is a serious reaction? What should I look for? Look for anything that concerns you, such as signs of a severe allergic reaction, very high fever, or unusual behavior. Signs of a severe allergic reaction can include hives, swelling of the face and throat, difficulty breathing, a fast heartbeat, dizziness, and weakness. These would usually start a few minutes to a few hours after the vaccination. What should I do?  If you think it is a severe allergic reaction  or other emergency that can't wait, call 9-1-1 or get the person to the nearest hospital. Otherwise, call your doctor.  Afterward, the reaction should be reported to the Vaccine Adverse Event Reporting System (VAERS). Your doctor might file this report, or you can do it yourself through the VAERS web site at www.vaers.SamedayNews.es, or by calling (613)063-9004. ? VAERS does not give medical advice. 6. The National Vaccine Injury Compensation Program The [employer] Vaccine Injury Compensation Program (VICP) is a federal program that was created to compensate people who may have been injured by certain vaccines. Persons who believe they may have been injured by a vaccine can learn about the program and about filing a claim by calling (972)779-9804 or visiting the [employer] website at GoldCloset.com.ee. There is a time limit to file a claim for compensation. 7. How can I learn more?  Ask your doctor.  He or she can give you the vaccine package insert or suggest other sources of information.  Call your local or state health department.  Contact the Centers for Disease Control and Prevention (CDC): ? Call (580)737-9240 (1-800-CDC-INFO) or ? Visit CDC's website at http://hunter.com/ CDC Haemophilus Influenzae Type b (Hib) Vaccine VIS (02/26/14) This information is not intended to replace advice given to you by your health care provider. Make sure you discuss any questions you have with your health care provider. Document Released: 09/10/2006 Document Revised: 08/03/2016 Document Reviewed: 08/03/2016 Elsevier Interactive Patient Education  2017 [employer].

## 2018-08-20 NOTE — Progress Notes (Signed)
Hematology and Oncology Follow Up Visit  Paige Hardin [id number] 08/14/1946 72 y.o. 08/20/2018   Principle Diagnosis:  Anemia secondary to iron deficiency  Anemia secondary to renal insufficiency  Chronic anticoagulation for paroxysmal atrial fibrillation  Current Therapy:   IV iron as indicated - last received in August 2019 Aranesp 300 mcg subcutaneous as needed for hemoglobin less than 11  ELIQUIS - life long   Interim History:  Paige Hardin is here today with her sister for follow-up.  She is doing okay.  She is on chronic oxygen.  She has had some rectal bleeding.  Her blood count is certainly quite low.  She has not gone iron for a while.  She is on oral iron.  I have no idea why she is still on oral iron.  I will stop this.  Back in August, her ferritin was 197 with iron saturation of 23%.  I think the last time she got IV iron was probably back in August.  ECOG Performance Status: 2 - Symptomatic, <50% confined to bed  Medications:  Allergies as of 08/20/2018      Reactions   Ace Inhibitors Cough   Clindamycin Rash      Medication List        Accurate as of 08/20/18 12:26 PM. Always use your most recent med list.          acetaminophen 325 MG tablet Commonly known as:  TYLENOL Take 325 mg by mouth daily as needed for mild pain.   albuterol (2.5 MG/3ML) 0.083% nebulizer solution Commonly known as:  PROVENTIL Take 2.5 mg by nebulization every 6 (six) hours as needed for wheezing or shortness of breath.   albuterol 108 (90 Base) MCG/ACT inhaler Commonly known as:  PROVENTIL HFA;VENTOLIN HFA Inhale 2 puffs into the lungs every 6 (six) hours as needed for wheezing or shortness of breath.   allopurinol 100 MG tablet Commonly known as:  ZYLOPRIM Take 1 tablet (100 mg total) by mouth daily.   amiodarone 200 MG tablet Commonly known as:  PACERONE Take 1 tablet (200 mg total) by mouth daily.   apixaban 5 MG Tabs tablet Commonly known as:  ELIQUIS Take 1  tablet (5 mg total) by mouth 2 (two) times daily.   calcium-vitamin D 500-200 MG-UNIT tablet Commonly known as:  OSCAL WITH D Take 1 tablet by mouth daily with breakfast.   DIABETIC TUSSIN EX 100 MG/5ML liquid Generic drug:  guaiFENesin Take 200 mg by mouth 3 (three) times daily as needed for cough.   diltiazem 180 MG 24 hr capsule Commonly known as:  CARDIZEM CD Take 1 capsule (180 mg total) by mouth daily.   ezetimibe-simvastatin 10-40 MG tablet Commonly known as:  VYTORIN Take 1 tablet by mouth at bedtime.   ferrous sulfate 325 (65 FE) MG tablet Take 325 mg by mouth 2 (two) times daily with a meal.   furosemide 80 MG tablet Commonly known as:  LASIX Take 2 tablets (160 mg total) by mouth 2 (two) times daily. MAKE SURE TO TAKE AT 8 AM AND 2 PM   insulin aspart 100 UNIT/ML injection Commonly known as:  novoLOG Inject 0-9 Units into the skin 3 (three) times daily with meals. Correction factor Sliding scale CBG 70 - 120: 0 units CBG 121 - 150: 1 unit,  CBG 151 - 200: 2 units,  CBG 201 - 250: 3 units,  CBG 251 - 300: 5 units,  CBG 301 - 350: 7 units,  CBG 351 -  400: 9 units   CBG > 400: 9 units and notify your MD   insulin NPH Human 100 UNIT/ML injection Commonly known as:  HUMULIN N,NOVOLIN N Inject 20 Units into the skin 2 (two) times daily before a meal.   levothyroxine 25 MCG tablet Commonly known as:  SYNTHROID, LEVOTHROID Take 1 tablet (25 mcg total) by mouth daily before breakfast.   metolazone 2.5 MG tablet Commonly known as:  ZAROXOLYN Take 1 tablet every Wednesday   metoprolol tartrate 50 MG tablet Commonly known as:  LOPRESSOR Take 1 tablet (50 mg total) by mouth 2 (two) times daily.   MULTIVITAMIN/IRON PO Take 1 tablet by mouth daily.   omeprazole 20 MG capsule Commonly known as:  PRILOSEC Take 20 mg by mouth daily.   ondansetron 4 MG tablet Commonly known as:  ZOFRAN every 8 (eight) hours as needed.   OXYGEN Inhale 4 L into the lungs continuous.     potassium chloride SA 20 MEQ tablet Commonly known as:  K-DUR,KLOR-CON Take 20 mEq by mouth 3 (three) times daily. MAKE SURE TO TAKE EXTRA 20 MEQ ON METOLAZONE DAYS   Tiotropium Bromide-Olodaterol 2.5-2.5 MCG/ACT Aers Inhale 2 puffs into the lungs daily.   vitamin B-12 500 MCG tablet Commonly known as:  CYANOCOBALAMIN Take 500 mcg by mouth daily.   Vitamin C 500 MG Caps Take 500 mg by mouth daily.   Vitamin D 2000 units tablet Take 2,000 Units by mouth daily.       Allergies:  Allergies  Allergen Reactions  . Ace Inhibitors Cough  . Clindamycin Rash    Past Medical History, Surgical history, Social history, and Family History were reviewed and updated.  Review of Systems: Review of Systems  Constitutional: Negative.   HENT: Negative.   Eyes: Negative.   Respiratory: Positive for shortness of breath.   Cardiovascular: Positive for palpitations.  Gastrointestinal: Negative.   Genitourinary: Negative.   Musculoskeletal: Negative.   Skin: Negative.   Neurological: Negative.   Endo/Heme/Allergies: Negative.   Psychiatric/Behavioral: Negative.      Physical Exam:  weight is 301 lb 6.4 oz (136.7 kg) (abnormal). Her oral temperature is 98.1 F (36.7 C). Her blood pressure is 111/57 (abnormal) and her pulse is 62. Her respiration is 20 and oxygen saturation is 99%.   Wt Readings from Last 3 Encounters:  08/20/18 (!) 301 lb 6.4 oz (136.7 kg)  07/23/18 (!) 303 lb 2 oz (137.5 kg)  06/25/18 299 lb 12.8 oz (136 kg)    Ocular: Sclerae unicteric, pupils equal, round and reactive to light Ear-nose-throat: Oropharynx clear, dentition fair Lymphatic: No cervical, supraclavicular or axillary adenopathy Lungs no rales or rhonchi, good excursion bilaterally Heart regular rate and rhythm, no murmur appreciated Abd soft, nontender, positive bowel sounds, no liver or spleen tip palpated on exam, no fluid wave  MSK no focal spinal tenderness, no joint edema Neuro: non-focal,  well-oriented, appropriate affect Breasts: Deferred   Lab Results  Component Value Date   WBC 9.7 08/20/2018   HGB 7.8 (L) 08/20/2018   HCT 26.8 (L) 08/20/2018   MCV 101.5 (H) 08/20/2018   PLT 197 08/20/2018   Lab Results  Component Value Date   FERRITIN 197 07/23/2018   IRON 69 07/23/2018   TIBC 301 07/23/2018   UIBC 232 07/23/2018   IRONPCTSAT 23 07/23/2018   Lab Results  Component Value Date   RETICCTPCT 7.3 (H) 07/23/2018   RBC 2.64 (L) 08/20/2018   RETICCTABS 89.0 09/21/2015   No results  found for: KPAFRELGTCHN, LAMBDASER, KAPLAMBRATIO No results found for: IGGSERUM, IGA, IGMSERUM No results found for: Odetta Pink, SPEI   Chemistry      Component Value Date/Time   NA 143 08/20/2018 1053   NA 141 10/16/2017 1106   NA 140 07/31/2017 1028   K 4.6 08/20/2018 1053   K 3.8 10/16/2017 1106   K 4.2 07/31/2017 1028   CL 100 08/20/2018 1053   CL 92 (L) 10/16/2017 1106   CO2 35 (H) 08/20/2018 1053   CO2 31 10/16/2017 1106   CO2 35 (H) 07/31/2017 1028   BUN 47 (H) 08/20/2018 1053   BUN 40 (H) 10/16/2017 1106   BUN 42.7 (H) 07/31/2017 1028   CREATININE 2.70 (H) 08/20/2018 1053   CREATININE 1.7 (H) 10/16/2017 1106   CREATININE 1.5 (H) 07/31/2017 1028      Component Value Date/Time   CALCIUM 9.6 08/20/2018 1053   CALCIUM 9.5 10/16/2017 1106   CALCIUM 10.1 07/31/2017 1028   ALKPHOS 62 08/20/2018 1053   ALKPHOS 68 10/16/2017 1106   ALKPHOS 69 07/31/2017 1028   AST 21 08/20/2018 1053   AST 14 07/31/2017 1028   ALT 22 08/20/2018 1053   ALT 15 10/16/2017 1106   ALT 14 07/31/2017 1028   BILITOT 0.7 08/20/2018 1053   BILITOT 0.54 07/31/2017 1028      Impression and Plan: Paige Hardin is a very pleasant 72 yo caucasian female with both iron deficiency anemia and anemia of chronic renal failure.  We will go ahead and give her a dose of Aranesp today.  I think this is critical.  We will have to see what her iron  levels are.  Since she is a bleeding, I would think that her iron levels will be on the low side.  We will have to follow her closely.  I would like to have her come back in about 3 weeks.  I think we have to maintain close follow-up so that her blood is not can get low, particular with the holiday season coming up.      Volanda Napoleon, MD 9/24/201912:26 PM

## 2018-08-21 DIAGNOSIS — R2681 Unsteadiness on feet: Secondary | ICD-10-CM | POA: Diagnosis not present

## 2018-08-21 DIAGNOSIS — M6281 Muscle weakness (generalized): Secondary | ICD-10-CM | POA: Diagnosis not present

## 2018-08-21 DIAGNOSIS — J441 Chronic obstructive pulmonary disease with (acute) exacerbation: Secondary | ICD-10-CM | POA: Diagnosis not present

## 2018-08-21 DIAGNOSIS — R262 Difficulty in walking, not elsewhere classified: Secondary | ICD-10-CM | POA: Diagnosis not present

## 2018-08-21 LAB — IRON AND TIBC
Iron: 38 ug/dL — ABNORMAL LOW (ref 41–142)
Saturation Ratios: 15 % — ABNORMAL LOW (ref 21–57)
TIBC: 258 ug/dL (ref 236–444)
UIBC: 219 ug/dL

## 2018-08-21 LAB — FERRITIN: FERRITIN: 314 ng/mL — AB (ref 11–307)

## 2018-08-22 DIAGNOSIS — M6281 Muscle weakness (generalized): Secondary | ICD-10-CM | POA: Diagnosis not present

## 2018-08-22 DIAGNOSIS — R262 Difficulty in walking, not elsewhere classified: Secondary | ICD-10-CM | POA: Diagnosis not present

## 2018-08-22 DIAGNOSIS — J441 Chronic obstructive pulmonary disease with (acute) exacerbation: Secondary | ICD-10-CM | POA: Diagnosis not present

## 2018-08-22 DIAGNOSIS — R2681 Unsteadiness on feet: Secondary | ICD-10-CM | POA: Diagnosis not present

## 2018-08-26 DIAGNOSIS — M6281 Muscle weakness (generalized): Secondary | ICD-10-CM | POA: Diagnosis not present

## 2018-08-26 DIAGNOSIS — R2681 Unsteadiness on feet: Secondary | ICD-10-CM | POA: Diagnosis not present

## 2018-08-26 DIAGNOSIS — R262 Difficulty in walking, not elsewhere classified: Secondary | ICD-10-CM | POA: Diagnosis not present

## 2018-08-26 DIAGNOSIS — J441 Chronic obstructive pulmonary disease with (acute) exacerbation: Secondary | ICD-10-CM | POA: Diagnosis not present

## 2018-08-28 DIAGNOSIS — J441 Chronic obstructive pulmonary disease with (acute) exacerbation: Secondary | ICD-10-CM | POA: Diagnosis not present

## 2018-08-28 DIAGNOSIS — R262 Difficulty in walking, not elsewhere classified: Secondary | ICD-10-CM | POA: Diagnosis not present

## 2018-08-28 DIAGNOSIS — R2681 Unsteadiness on feet: Secondary | ICD-10-CM | POA: Diagnosis not present

## 2018-08-28 DIAGNOSIS — M6281 Muscle weakness (generalized): Secondary | ICD-10-CM | POA: Diagnosis not present

## 2018-08-29 DIAGNOSIS — M6281 Muscle weakness (generalized): Secondary | ICD-10-CM | POA: Diagnosis not present

## 2018-08-29 DIAGNOSIS — R2681 Unsteadiness on feet: Secondary | ICD-10-CM | POA: Diagnosis not present

## 2018-08-29 DIAGNOSIS — R262 Difficulty in walking, not elsewhere classified: Secondary | ICD-10-CM | POA: Diagnosis not present

## 2018-08-29 DIAGNOSIS — J441 Chronic obstructive pulmonary disease with (acute) exacerbation: Secondary | ICD-10-CM | POA: Diagnosis not present

## 2018-09-02 DIAGNOSIS — M6281 Muscle weakness (generalized): Secondary | ICD-10-CM | POA: Diagnosis not present

## 2018-09-02 DIAGNOSIS — R262 Difficulty in walking, not elsewhere classified: Secondary | ICD-10-CM | POA: Diagnosis not present

## 2018-09-02 DIAGNOSIS — J441 Chronic obstructive pulmonary disease with (acute) exacerbation: Secondary | ICD-10-CM | POA: Diagnosis not present

## 2018-09-02 DIAGNOSIS — R2681 Unsteadiness on feet: Secondary | ICD-10-CM | POA: Diagnosis not present

## 2018-09-04 DIAGNOSIS — J441 Chronic obstructive pulmonary disease with (acute) exacerbation: Secondary | ICD-10-CM | POA: Diagnosis not present

## 2018-09-04 DIAGNOSIS — M6281 Muscle weakness (generalized): Secondary | ICD-10-CM | POA: Diagnosis not present

## 2018-09-04 DIAGNOSIS — R262 Difficulty in walking, not elsewhere classified: Secondary | ICD-10-CM | POA: Diagnosis not present

## 2018-09-04 DIAGNOSIS — R2681 Unsteadiness on feet: Secondary | ICD-10-CM | POA: Diagnosis not present

## 2018-09-05 DIAGNOSIS — J441 Chronic obstructive pulmonary disease with (acute) exacerbation: Secondary | ICD-10-CM | POA: Diagnosis not present

## 2018-09-05 DIAGNOSIS — R2681 Unsteadiness on feet: Secondary | ICD-10-CM | POA: Diagnosis not present

## 2018-09-05 DIAGNOSIS — R262 Difficulty in walking, not elsewhere classified: Secondary | ICD-10-CM | POA: Diagnosis not present

## 2018-09-05 DIAGNOSIS — M6281 Muscle weakness (generalized): Secondary | ICD-10-CM | POA: Diagnosis not present

## 2018-09-06 ENCOUNTER — Inpatient Hospital Stay (HOSPITAL_COMMUNITY)
Admission: EM | Admit: 2018-09-06 | Discharge: 2018-09-14 | DRG: 871 | Disposition: A | Discharge status: Skilled Nursing Facility | Payer: Medicare Other | Attending: Internal Medicine | Admitting: Internal Medicine

## 2018-09-06 ENCOUNTER — Encounter (HOSPITAL_COMMUNITY): Payer: Self-pay | Admitting: Emergency Medicine

## 2018-09-06 ENCOUNTER — Other Ambulatory Visit: Payer: Self-pay

## 2018-09-06 ENCOUNTER — Emergency Department (HOSPITAL_COMMUNITY): Payer: Medicare Other

## 2018-09-06 DIAGNOSIS — E1122 Type 2 diabetes mellitus with diabetic chronic kidney disease: Secondary | ICD-10-CM | POA: Diagnosis present

## 2018-09-06 DIAGNOSIS — E1165 Type 2 diabetes mellitus with hyperglycemia: Secondary | ICD-10-CM | POA: Diagnosis present

## 2018-09-06 DIAGNOSIS — G8929 Other chronic pain: Secondary | ICD-10-CM | POA: Diagnosis present

## 2018-09-06 DIAGNOSIS — E785 Hyperlipidemia, unspecified: Secondary | ICD-10-CM | POA: Diagnosis present

## 2018-09-06 DIAGNOSIS — J440 Chronic obstructive pulmonary disease with acute lower respiratory infection: Secondary | ICD-10-CM | POA: Diagnosis present

## 2018-09-06 DIAGNOSIS — Z6841 Body Mass Index (BMI) 40.0 and over, adult: Secondary | ICD-10-CM

## 2018-09-06 DIAGNOSIS — A419 Sepsis, unspecified organism: Principal | ICD-10-CM | POA: Diagnosis present

## 2018-09-06 DIAGNOSIS — Z825 Family history of asthma and other chronic lower respiratory diseases: Secondary | ICD-10-CM

## 2018-09-06 DIAGNOSIS — I509 Heart failure, unspecified: Secondary | ICD-10-CM | POA: Diagnosis not present

## 2018-09-06 DIAGNOSIS — I350 Nonrheumatic aortic (valve) stenosis: Secondary | ICD-10-CM | POA: Diagnosis present

## 2018-09-06 DIAGNOSIS — M1990 Unspecified osteoarthritis, unspecified site: Secondary | ICD-10-CM | POA: Diagnosis present

## 2018-09-06 DIAGNOSIS — R05 Cough: Secondary | ICD-10-CM | POA: Diagnosis not present

## 2018-09-06 DIAGNOSIS — L89326 Pressure-induced deep tissue damage of left buttock: Secondary | ICD-10-CM | POA: Diagnosis present

## 2018-09-06 DIAGNOSIS — J441 Chronic obstructive pulmonary disease with (acute) exacerbation: Secondary | ICD-10-CM | POA: Diagnosis not present

## 2018-09-06 DIAGNOSIS — R059 Cough, unspecified: Secondary | ICD-10-CM

## 2018-09-06 DIAGNOSIS — R0989 Other specified symptoms and signs involving the circulatory and respiratory systems: Secondary | ICD-10-CM | POA: Diagnosis not present

## 2018-09-06 DIAGNOSIS — J189 Pneumonia, unspecified organism: Secondary | ICD-10-CM | POA: Diagnosis present

## 2018-09-06 DIAGNOSIS — L8990 Pressure ulcer of unspecified site, unspecified stage: Secondary | ICD-10-CM

## 2018-09-06 DIAGNOSIS — I5033 Acute on chronic diastolic (congestive) heart failure: Secondary | ICD-10-CM | POA: Diagnosis present

## 2018-09-06 DIAGNOSIS — R0689 Other abnormalities of breathing: Secondary | ICD-10-CM | POA: Diagnosis not present

## 2018-09-06 DIAGNOSIS — Z66 Do not resuscitate: Secondary | ICD-10-CM | POA: Diagnosis present

## 2018-09-06 DIAGNOSIS — J811 Chronic pulmonary edema: Secondary | ICD-10-CM | POA: Diagnosis not present

## 2018-09-06 DIAGNOSIS — I110 Hypertensive heart disease with heart failure: Secondary | ICD-10-CM | POA: Diagnosis not present

## 2018-09-06 DIAGNOSIS — I4819 Other persistent atrial fibrillation: Secondary | ICD-10-CM | POA: Diagnosis present

## 2018-09-06 DIAGNOSIS — M545 Low back pain: Secondary | ICD-10-CM | POA: Diagnosis present

## 2018-09-06 DIAGNOSIS — Z90710 Acquired absence of both cervix and uterus: Secondary | ICD-10-CM

## 2018-09-06 DIAGNOSIS — E039 Hypothyroidism, unspecified: Secondary | ICD-10-CM | POA: Diagnosis present

## 2018-09-06 DIAGNOSIS — Z881 Allergy status to other antibiotic agents status: Secondary | ICD-10-CM

## 2018-09-06 DIAGNOSIS — J9621 Acute and chronic respiratory failure with hypoxia: Secondary | ICD-10-CM | POA: Diagnosis not present

## 2018-09-06 DIAGNOSIS — Z7401 Bed confinement status: Secondary | ICD-10-CM

## 2018-09-06 DIAGNOSIS — Y95 Nosocomial condition: Secondary | ICD-10-CM | POA: Diagnosis present

## 2018-09-06 DIAGNOSIS — J439 Emphysema, unspecified: Secondary | ICD-10-CM | POA: Diagnosis present

## 2018-09-06 DIAGNOSIS — I272 Pulmonary hypertension, unspecified: Secondary | ICD-10-CM | POA: Diagnosis present

## 2018-09-06 DIAGNOSIS — Z9981 Dependence on supplemental oxygen: Secondary | ICD-10-CM

## 2018-09-06 DIAGNOSIS — I4510 Unspecified right bundle-branch block: Secondary | ICD-10-CM | POA: Diagnosis not present

## 2018-09-06 DIAGNOSIS — R0602 Shortness of breath: Secondary | ICD-10-CM | POA: Diagnosis not present

## 2018-09-06 DIAGNOSIS — I480 Paroxysmal atrial fibrillation: Secondary | ICD-10-CM | POA: Diagnosis present

## 2018-09-06 DIAGNOSIS — Z888 Allergy status to other drugs, medicaments and biological substances status: Secondary | ICD-10-CM

## 2018-09-06 DIAGNOSIS — Z794 Long term (current) use of insulin: Secondary | ICD-10-CM

## 2018-09-06 DIAGNOSIS — R627 Adult failure to thrive: Secondary | ICD-10-CM | POA: Diagnosis present

## 2018-09-06 DIAGNOSIS — N184 Chronic kidney disease, stage 4 (severe): Secondary | ICD-10-CM | POA: Diagnosis present

## 2018-09-06 DIAGNOSIS — L89316 Pressure-induced deep tissue damage of right buttock: Secondary | ICD-10-CM | POA: Diagnosis present

## 2018-09-06 DIAGNOSIS — Z79899 Other long term (current) drug therapy: Secondary | ICD-10-CM

## 2018-09-06 DIAGNOSIS — Z79890 Hormone replacement therapy (postmenopausal): Secondary | ICD-10-CM

## 2018-09-06 DIAGNOSIS — N390 Urinary tract infection, site not specified: Secondary | ICD-10-CM | POA: Diagnosis present

## 2018-09-06 DIAGNOSIS — D509 Iron deficiency anemia, unspecified: Secondary | ICD-10-CM | POA: Diagnosis present

## 2018-09-06 DIAGNOSIS — Z87891 Personal history of nicotine dependence: Secondary | ICD-10-CM

## 2018-09-06 DIAGNOSIS — R9431 Abnormal electrocardiogram [ECG] [EKG]: Secondary | ICD-10-CM | POA: Diagnosis present

## 2018-09-06 DIAGNOSIS — Z7901 Long term (current) use of anticoagulants: Secondary | ICD-10-CM

## 2018-09-06 DIAGNOSIS — T380X5A Adverse effect of glucocorticoids and synthetic analogues, initial encounter: Secondary | ICD-10-CM | POA: Diagnosis present

## 2018-09-06 DIAGNOSIS — D649 Anemia, unspecified: Secondary | ICD-10-CM

## 2018-09-06 DIAGNOSIS — R490 Dysphonia: Secondary | ICD-10-CM | POA: Diagnosis present

## 2018-09-06 DIAGNOSIS — D631 Anemia in chronic kidney disease: Secondary | ICD-10-CM | POA: Diagnosis present

## 2018-09-06 DIAGNOSIS — R1310 Dysphagia, unspecified: Secondary | ICD-10-CM | POA: Diagnosis present

## 2018-09-06 DIAGNOSIS — Z8701 Personal history of pneumonia (recurrent): Secondary | ICD-10-CM

## 2018-09-06 DIAGNOSIS — R0902 Hypoxemia: Secondary | ICD-10-CM | POA: Diagnosis not present

## 2018-09-06 DIAGNOSIS — K219 Gastro-esophageal reflux disease without esophagitis: Secondary | ICD-10-CM | POA: Diagnosis present

## 2018-09-06 LAB — COMPREHENSIVE METABOLIC PANEL
ALBUMIN: 3 g/dL — AB (ref 3.5–5.0)
ALK PHOS: 50 U/L (ref 38–126)
ALT: 13 U/L (ref 0–44)
AST: 22 U/L (ref 15–41)
Anion gap: 10 (ref 5–15)
BUN: 54 mg/dL — AB (ref 8–23)
CALCIUM: 8.9 mg/dL (ref 8.9–10.3)
CO2: 34 mmol/L — ABNORMAL HIGH (ref 22–32)
CREATININE: 2.19 mg/dL — AB (ref 0.44–1.00)
Chloride: 89 mmol/L — ABNORMAL LOW (ref 98–111)
GFR calc Af Amer: 24 mL/min — ABNORMAL LOW (ref >60)
GFR calc non Af Amer: 21 mL/min — ABNORMAL LOW (ref >60)
GLUCOSE: 195 mg/dL — AB (ref 70–99)
Potassium: 4.4 mmol/L (ref 3.5–5.1)
Sodium: 133 mmol/L — ABNORMAL LOW (ref 135–145)
TOTAL PROTEIN: 6.5 g/dL (ref 6.5–8.1)
Total Bilirubin: 1.1 mg/dL (ref 0.3–1.2)

## 2018-09-06 LAB — CBC WITH DIFFERENTIAL/PLATELET
BASOS PCT: 0 %
Basophils Absolute: 0 10*3/uL (ref 0.0–0.1)
EOS ABS: 0 10*3/uL (ref 0.0–0.5)
Eosinophils Relative: 0 %
HEMATOCRIT: 24.9 % — AB (ref 36.0–46.0)
Hemoglobin: 7.1 g/dL — ABNORMAL LOW (ref 12.0–15.0)
Lymphocytes Relative: 3 %
Lymphs Abs: 0.7 10*3/uL (ref 0.7–4.0)
MCH: 28.2 pg (ref 26.0–34.0)
MCHC: 28.5 g/dL — ABNORMAL LOW (ref 30.0–36.0)
MCV: 98.8 fL (ref 80.0–100.0)
MONO ABS: 0.5 10*3/uL (ref 0.1–1.0)
MONOS PCT: 2 %
Neutro Abs: 21.9 10*3/uL — ABNORMAL HIGH (ref 1.7–7.7)
Neutrophils Relative %: 95 %
PLATELETS: 189 10*3/uL (ref 150–400)
RBC: 2.52 MIL/uL — ABNORMAL LOW (ref 3.87–5.11)
RDW: 18.7 % — AB (ref 11.5–15.5)
WBC: 23.1 10*3/uL — ABNORMAL HIGH (ref 4.0–10.5)
nRBC: 0 % (ref 0.0–0.2)
nRBC: 0 /100 WBC

## 2018-09-06 LAB — I-STAT CG4 LACTIC ACID, ED: Lactic Acid, Venous: 1.34 mmol/L (ref 0.5–1.9)

## 2018-09-06 NOTE — ED Triage Notes (Signed)
Per EMS pt from [facility] at [place]. Hx of COPD and asthma. Normally takes 6 nebs/ day. Increasing SHOB all day today. Wears 4L via [place] continuously at home. Pt had 5 albuterol, 125 solu medrol, 300 NS en route 20g LAC.  Pt had chest x-ray at the facility for suspected pna but no results yet. Rhonchi throughtout

## 2018-09-06 NOTE — ED Notes (Signed)
To x-ray

## 2018-09-06 NOTE — ED Provider Notes (Addendum)
[facility] EMERGENCY DEPARTMENT Provider Note   CSN: [id number] Arrival date & time: 09/06/18  2226     History   Chief Complaint Chief Complaint  Patient presents with  . Shortness of Breath    HPI Paige Hardin is a 72 y.o. female.  HPI 72 year old female with past medical history as below including CHF, COPD, A. fib, diabetes, morbid obesity, here with shortness of breath.  The patient states that for the last week and a half, she has felt progressively short of breath.  She received 2 transfusions in the last month which mildly improved her symptoms, but over the last week have not helped.  She is had increased cough with sputum production.  She has had severe shortness of breath with any amount of exertion.  Earlier today, she began feeling more lightheaded and fatigued.  She noticed that her oxygen was in the low 80s despite being on her baseline 4 L.  She tried to increase this and called EMS, and it reportedly took multiple nebulizers and increasing her to 5 L to get her back above 90%.  She normally runs 90-94.  She denies known sick contacts.  She does note slight increase in her swelling of her legs.  She is been taking her Lasix.  She believes that her weight is not significantly elevated.  Denies any alleviating factors other than the treatments and increasing oxygen.  No aggravating factors.  Past Medical History:  Diagnosis Date  . Arthritis    "joints" (01/14/2014)  . Carotid atherosclerosis   . Chronic low back pain   . COPD (chronic obstructive pulmonary disease) ([place])   . Depression    "maybe" (01/14/2014  . Diastolic congestive heart failure ([place])   . Emphysema   . Excessive daytime sleepiness 09/08/2015  . GERD (gastroesophageal reflux disease)   . Hoarseness, chronic   . Hyperlipidemia   . Lung nodules   . Obese   . Paroxysmal atrial fibrillation (H[facility])    a. failed flecainide;  b. 03/2014 amio started->DCCV;  c. Chronic pradaxa.  .  Pneumonia 1990's   "once"  . Type 2 diabetes mellitus [facility])     Patient Active Problem List   Diagnosis Date Noted  . GI bleeding 05/21/2018  . Anemia of chronic renal failure, stage 3 (moderate) ([place]) 03/27/2017  . CHF (congestive heart failure) ([place]) 10/17/2016  . UTI (urinary tract infection) 10/17/2016  . Solitary pulmonary nodule 12/23/2015  . Type 2 diabetes mellitus with hyperglycemia ([place]) 11/16/2015  . Hyperlipidemia 11/16/2015  . Excessive daytime sleepiness 09/08/2015  . Iron deficiency anemia 03/29/2014  . Altered mental status 02/24/2014  . HTN (hypertension) 01/21/2014  . Chronic anticoagulation 01/21/2014  . Pulmonary HTN- pa 53 mmHg Echo 01/15/14 01/21/2014  . Morbid obesity- BMI 50 01/21/2014  . Poorly controlled diabetes mellitus ([place]) 01/21/2014  . Chronic renal disease, stage III ([place]) 01/21/2014  . Chronic diastolic heart failure ([place]) 01/14/2014  . CHEST PAIN 09/26/2010  . HYPERLIPIDEMIA-MIXED 05/14/2009  . HOARSENESS 05/14/2009  . CAROTID BRUIT 05/14/2009  . COPD with emphysema  05/05/2009  . PAF (paroxysmal atrial fibrillation) s/p multiple DCCVs; on Amiodarone 05/05/2009    Past Surgical History:  Procedure Laterality Date  . BREAST BIOPSY Left ~ 1980   "solid"  . BREAST LUMPECTOMY Left ~ 1980   "removed a mass; it was benign" (01/14/2014)  . CARDIOVERSION N/A 03/11/2014   Procedure: CARDIOVERSION;  Surgeon: Sanda Klein, MD;  Location: [facility];  Service: Cardiovascular;  Laterality: N/A;  . CARDIOVERSION N/A 03/27/2014   Procedure: CARDIOVERSION;  Surgeon: Darlin Coco, MD;  Location: [facility];  Service: Cardiovascular;  Laterality: N/A;  . COLONOSCOPY  01/26/2012   Procedure: COLONOSCOPY;  Surgeon: Beryle Beams, MD;  Location: [facility];  Service: Endoscopy;  Laterality: N/A;  . ESOPHAGOGASTRODUODENOSCOPY  01/26/2012   Procedure: ESOPHAGOGASTRODUODENOSCOPY (EGD);  Surgeon: Beryle Beams, MD;  Location: [facility];  Service: Endoscopy;   Laterality: N/A;  . TOTAL ABDOMINAL HYSTERECTOMY  1994     OB History   None      Home Medications    Prior to Admission medications   Medication Sig Start Date End Date Taking? Authorizing Provider  acetaminophen (TYLENOL) 325 MG tablet Take 325 mg by mouth daily as needed for mild pain.     [provider]  albuterol (PROVENTIL HFA;VENTOLIN HFA) 108 (90 Base) MCG/ACT inhaler Inhale 2 puffs into the lungs every 6 (six) hours as needed for wheezing or shortness of breath.    [provider]  albuterol (PROVENTIL) (2.5 MG/3ML) 0.083% nebulizer solution Take 2.5 mg by nebulization every 6 (six) hours as needed for wheezing or shortness of breath.    [provider]  allopurinol (ZYLOPRIM) 100 MG tablet Take 1 tablet (100 mg total) by mouth daily. 10/16/17   [place], Holli Humbles, NP  amiodarone (PACERONE) 200 MG tablet Take 1 tablet (200 mg total) by mouth daily. 04/23/14   Richardson Dopp T, PA-C  apixaban (ELIQUIS) 5 MG TABS tablet Take 1 tablet (5 mg total) by mouth 2 (two) times daily. 04/03/14   Minor, Grace Bushy, NP  Ascorbic Acid (VITAMIN C) 500 MG CAPS Take 500 mg by mouth daily.     [provider]  calcium-vitamin D (OSCAL WITH D) 500-200 MG-UNIT per tablet Take 1 tablet by mouth daily with breakfast.    [provider]  Cholecalciferol (VITAMIN D) 2000 UNITS tablet Take 2,000 Units by mouth daily.    [provider]  diltiazem (CARDIZEM CD) 180 MG 24 hr capsule Take 1 capsule (180 mg total) by mouth daily. 03/12/14   Edmisten, Brooke O, PA-C  ezetimibe-simvastatin (VYTORIN) 10-40 MG per tablet Take 1 tablet by mouth at bedtime.     [provider]  furosemide (LASIX) 80 MG tablet Take 2 tablets (160 mg total) by mouth 2 (two) times daily. MAKE SURE TO TAKE AT 8 AM AND 2 PM 04/23/14   Richardson Dopp T, PA-C  guaiFENesin (DIABETIC TUSSIN EX) 100 MG/5ML liquid Take 200 mg by mouth 3 (three) times daily as needed for cough.     [provider]  insulin aspart (NOVOLOG) 100 UNIT/ML injection Inject 0-9 Units into the skin 3 (three) times daily with meals. Correction factor Sliding scale CBG 70 - 120: 0 units CBG 121 - 150: 1 unit,  CBG 151 - 200: 2 units,  CBG 201 - 250: 3 units,  CBG 251 - 300: 5 units,  CBG 301 - 350: 7 units,  CBG 351 - 400: 9 units   CBG > 400: 9 units and notify your MD 11/19/15   Rai, Vernelle Emerald, MD  insulin NPH Human (HUMULIN N,NOVOLIN N) 100 UNIT/ML injection Inject 20 Units into the skin 2 (two) times daily before a meal.  04/04/16   [provider]  levothyroxine (LEVOXYL) 25 MCG tablet Take 1 tablet (25 mcg total) by mouth daily before breakfast. 06/24/18   Bensimhon, Shaune Pascal, MD  metolazone (ZAROXOLYN) 2.5 MG tablet Take  1 tablet every Wednesday 06/11/18   Larey Dresser, MD  metoprolol (LOPRESSOR) 50 MG tablet Take 1 tablet (50 mg total) by mouth 2 (two) times daily. 03/12/14   Edmisten, Azzie Roup, PA-C  Multiple Vitamins-Iron (MULTIVITAMIN/IRON PO) Take 1 tablet by mouth daily.    [provider]  omeprazole (PRILOSEC) 20 MG capsule Take 20 mg by mouth daily.    [provider]  ondansetron (ZOFRAN) 4 MG tablet every 8 (eight) hours as needed.  12/04/17   [provider]  OXYGEN Inhale 4 L into the lungs continuous.     [provider]  potassium chloride SA (K-DUR,KLOR-CON) 20 MEQ tablet Take 20 mEq by mouth 3 (three) times daily. MAKE SURE TO TAKE EXTRA 20 MEQ ON METOLAZONE DAYS  04/23/14   Richardson Dopp T, PA-C  Tiotropium Bromide-Olodaterol (STIOLTO RESPIMAT) 2.5-2.5 MCG/ACT AERS Inhale 2 puffs into the lungs daily. 10/26/17   Collene Gobble, MD  vitamin B-12 (CYANOCOBALAMIN) 500 MCG tablet Take 500 mcg by mouth daily.    [provider]    Family History Family History  Problem Relation Age of Onset  . Breast cancer Sister   . Heart disease Unknown   . Hodgkin's lymphoma Unknown   . Asthma Mother     Social History Social  History   Tobacco Use  . Smoking status: Former Smoker    Packs/day: 3.00    Years: 45.00    Pack years: 135.00    Types: Cigarettes    Start date: 11/08/1963    Last attempt to quit: 12/28/2008    Years since quitting: 9.6  . Smokeless tobacco: Never Used  . Tobacco comment: quit smoking 5 years ago  Substance Use Topics  . Alcohol use: Yes    Alcohol/week: 0.0 standard drinks    Comment: 01/14/2014 "used to drink socially in the past; last drink was probably 10 yr ago"  . Drug use: No     Allergies   Ace inhibitors and Clindamycin   Review of Systems Review of Systems  Constitutional: Positive for fatigue. Negative for chills and fever.  HENT: Negative for congestion and rhinorrhea.   Eyes: Negative for visual disturbance.  Respiratory: Positive for cough and shortness of breath. Negative for wheezing.   Cardiovascular: Positive for leg swelling. Negative for chest pain.  Gastrointestinal: Negative for abdominal pain, diarrhea, nausea and vomiting.  Genitourinary: Negative for dysuria and flank pain.  Musculoskeletal: Negative for neck pain and neck stiffness.  Skin: Negative for rash and wound.  Allergic/Immunologic: Negative for immunocompromised state.  Neurological: Positive for weakness. Negative for syncope and headaches.  All other systems reviewed and are negative.    Physical Exam Updated Vital Signs BP (!) 113/46   Pulse 75   Resp (!) 23   SpO2 95%   Physical Exam  Constitutional: She is oriented to person, place, and time. She appears well-developed. No distress.  Chronically ill-appearing, appears older than stated age  HENT:  Head: Normocephalic and atraumatic.  Eyes: Conjunctivae are normal.  Neck: Neck supple.  Cardiovascular: Normal rate, regular rhythm and normal heart sounds. Exam reveals no friction rub.  No murmur heard. Pulmonary/Chest: Tachypnea noted. She is in respiratory distress. She has decreased breath sounds. She has wheezes in the  right upper field and the left upper field. She has rales in the right lower field and the left lower field.  Abdominal: Soft. She exhibits no distension.  Musculoskeletal:       Right lower  leg: She exhibits edema.       Left lower leg: She exhibits edema.  Neurological: She is alert and oriented to person, place, and time. She exhibits normal muscle tone.  Skin: Skin is warm. Capillary refill takes less than 2 seconds.  Psychiatric: She has a normal mood and affect.  Nursing note and vitals reviewed.    ED Treatments / Results  Labs (all labs ordered are listed, but only abnormal results are displayed) Labs Reviewed  COMPREHENSIVE METABOLIC PANEL - Abnormal; Notable for the following components:      Result Value   Sodium 133 (*)    Chloride 89 (*)    CO2 34 (*)    Glucose, Bld 195 (*)    BUN 54 (*)    Creatinine, Ser 2.19 (*)    Albumin 3.0 (*)    GFR calc non Af Amer 21 (*)    GFR calc Af Amer 24 (*)    All other components within normal limits  CBC WITH DIFFERENTIAL/PLATELET - Abnormal; Notable for the following components:   WBC 23.1 (*)    RBC 2.52 (*)    Hemoglobin 7.1 (*)    HCT 24.9 (*)    MCHC 28.5 (*)    RDW 18.7 (*)    Neutro Abs 21.9 (*)    All other components within normal limits  URINALYSIS, ROUTINE W REFLEX MICROSCOPIC  TROPONIN I  BRAIN NATRIURETIC PEPTIDE  I-STAT CG4 LACTIC ACID, ED  I-STAT CG4 LACTIC ACID, ED  TYPE AND SCREEN  PREPARE RBC (CROSSMATCH)    EKG EKG Interpretation  Date/Time:  Friday September 06 2018 22:36:24 EDT Ventricular Rate:  76 PR Interval:    QRS Duration: 173 QT Interval:  483 QTC Calculation: 544 R Axis:   40 Text Interpretation:  Sinus rhythm Right bundle branch block No significant change since last tracing Confirmed by Duffy Bruce [phone]) on 09/06/2018 11:19:57 PM   Radiology Dg Chest 2 View  Result Date: 09/06/2018 CLINICAL DATA:  72 year old female presents with dyspnea. EXAM: CHEST - 2 VIEW COMPARISON:   Chest CT 12/18/2017, CXR 10/17/2016 FINDINGS: The cardiopericardial silhouette is enlarged as before. Diffuse interstitial edema is accentuated on current exam. Moderate aortic atherosclerosis with slight uncoiling. No aortic aneurysm. No acute osseous abnormality. No significant pleural effusion or pneumothorax. The posterior costophrenic angles are excluded on the lateral view. IMPRESSION: Cardiomegaly with diffuse interstitial pulmonary edema. Aortic atherosclerosis without aneurysm. Electronically Signed   By: Ashley Royalty M.D.   On: 09/06/2018 23:32    Procedures .Critical Care Performed by: Duffy Bruce, MD Authorized by: Duffy Bruce, MD   Critical care provider statement:    Critical care time (minutes):  35   Critical care time was exclusive of:  Separately billable procedures and treating other patients and teaching time   Critical care was necessary to treat or prevent imminent or life-threatening deterioration of the following conditions:  Cardiac failure, circulatory failure and respiratory failure   Critical care was time spent personally by me on the following activities:  Development of treatment plan with patient or surrogate, discussions with consultants, evaluation of patient's response to treatment, examination of patient, obtaining history from patient or surrogate, ordering and performing treatments and interventions, ordering and review of laboratory studies, ordering and review of radiographic studies, pulse oximetry, re-evaluation of patient's condition and review of old charts   I assumed direction of critical care for this patient from another provider in my specialty: no     (including  critical care time)  Medications Ordered in ED Medications  furosemide (LASIX) 120 mg in dextrose 5 % 50 mL IVPB (has no administration in time range)  0.9 %  sodium chloride infusion (Manually program via Guardrails IV Fluids) (has no administration in time range)    ipratropium-albuterol (DUONEB) 0.5-2.5 (3) MG/3ML nebulizer solution 3 mL (has no administration in time range)     Initial Impression / Assessment and Plan / ED Course  I have reviewed the triage vital signs and the nursing notes.  Pertinent labs & imaging results that were available during my care of the patient were reviewed by me and considered in my medical decision making (see chart for details).     72 year old female here with severe shortness of breath.  On arrival, patient has mild wheezing but diffuse rales.  She has a moderate leukocytosis which could be reactive secondary to steroids, as well as decrease in her hemoglobin is 7.1.  This is a chronic issue.  She is on anticoagulation for A. fib and intermittently receives transfusions.  I suspect her symptoms today are multifactorial secondary to symptomatic anemia along with likely fluid overload related to her recent transfusions.  Her chest x-ray shows interstitial edema.  Will give a dose of Lasix, transfuse slowly, and will likely need repeated doses to prevent further fluid overload.  Otherwise, she has no fever, no focal infiltrate on x-ray, normal lactic acid, and have a lower concern for pneumonia or infectious process.   Final diagnoses:  Symptomatic anemia  Acute on chronic congestive heart failure, unspecified heart failure type [facility])    ED Discharge Orders    None       Duffy Bruce, MD 09/07/18 1245    Duffy Bruce, MD 09/07/18 0040

## 2018-09-07 ENCOUNTER — Other Ambulatory Visit: Payer: Self-pay

## 2018-09-07 DIAGNOSIS — J438 Other emphysema: Secondary | ICD-10-CM

## 2018-09-07 DIAGNOSIS — J9620 Acute and chronic respiratory failure, unspecified whether with hypoxia or hypercapnia: Secondary | ICD-10-CM | POA: Diagnosis not present

## 2018-09-07 DIAGNOSIS — J440 Chronic obstructive pulmonary disease with acute lower respiratory infection: Secondary | ICD-10-CM | POA: Diagnosis not present

## 2018-09-07 DIAGNOSIS — D649 Anemia, unspecified: Secondary | ICD-10-CM | POA: Diagnosis not present

## 2018-09-07 DIAGNOSIS — E569 Vitamin deficiency, unspecified: Secondary | ICD-10-CM | POA: Diagnosis not present

## 2018-09-07 DIAGNOSIS — E1165 Type 2 diabetes mellitus with hyperglycemia: Secondary | ICD-10-CM | POA: Diagnosis present

## 2018-09-07 DIAGNOSIS — J9600 Acute respiratory failure, unspecified whether with hypoxia or hypercapnia: Secondary | ICD-10-CM | POA: Diagnosis not present

## 2018-09-07 DIAGNOSIS — Z111 Encounter for screening for respiratory tuberculosis: Secondary | ICD-10-CM | POA: Diagnosis not present

## 2018-09-07 DIAGNOSIS — E1122 Type 2 diabetes mellitus with diabetic chronic kidney disease: Secondary | ICD-10-CM | POA: Diagnosis present

## 2018-09-07 DIAGNOSIS — J449 Chronic obstructive pulmonary disease, unspecified: Secondary | ICD-10-CM | POA: Diagnosis not present

## 2018-09-07 DIAGNOSIS — E785 Hyperlipidemia, unspecified: Secondary | ICD-10-CM | POA: Diagnosis not present

## 2018-09-07 DIAGNOSIS — I4510 Unspecified right bundle-branch block: Secondary | ICD-10-CM | POA: Diagnosis present

## 2018-09-07 DIAGNOSIS — I10 Essential (primary) hypertension: Secondary | ICD-10-CM | POA: Diagnosis not present

## 2018-09-07 DIAGNOSIS — R52 Pain, unspecified: Secondary | ICD-10-CM | POA: Diagnosis not present

## 2018-09-07 DIAGNOSIS — I480 Paroxysmal atrial fibrillation: Secondary | ICD-10-CM | POA: Diagnosis not present

## 2018-09-07 DIAGNOSIS — R0602 Shortness of breath: Secondary | ICD-10-CM | POA: Diagnosis not present

## 2018-09-07 DIAGNOSIS — J9621 Acute and chronic respiratory failure with hypoxia: Secondary | ICD-10-CM | POA: Diagnosis not present

## 2018-09-07 DIAGNOSIS — A419 Sepsis, unspecified organism: Secondary | ICD-10-CM | POA: Diagnosis not present

## 2018-09-07 DIAGNOSIS — Z6841 Body Mass Index (BMI) 40.0 and over, adult: Secondary | ICD-10-CM | POA: Diagnosis not present

## 2018-09-07 DIAGNOSIS — R05 Cough: Secondary | ICD-10-CM | POA: Diagnosis not present

## 2018-09-07 DIAGNOSIS — D509 Iron deficiency anemia, unspecified: Secondary | ICD-10-CM

## 2018-09-07 DIAGNOSIS — N189 Chronic kidney disease, unspecified: Secondary | ICD-10-CM | POA: Diagnosis not present

## 2018-09-07 DIAGNOSIS — T380X5A Adverse effect of glucocorticoids and synthetic analogues, initial encounter: Secondary | ICD-10-CM | POA: Diagnosis present

## 2018-09-07 DIAGNOSIS — M6281 Muscle weakness (generalized): Secondary | ICD-10-CM | POA: Diagnosis not present

## 2018-09-07 DIAGNOSIS — J441 Chronic obstructive pulmonary disease with (acute) exacerbation: Secondary | ICD-10-CM | POA: Diagnosis not present

## 2018-09-07 DIAGNOSIS — K5900 Constipation, unspecified: Secondary | ICD-10-CM | POA: Diagnosis not present

## 2018-09-07 DIAGNOSIS — E119 Type 2 diabetes mellitus without complications: Secondary | ICD-10-CM | POA: Diagnosis not present

## 2018-09-07 DIAGNOSIS — Z66 Do not resuscitate: Secondary | ICD-10-CM | POA: Diagnosis not present

## 2018-09-07 DIAGNOSIS — R1310 Dysphagia, unspecified: Secondary | ICD-10-CM | POA: Diagnosis present

## 2018-09-07 DIAGNOSIS — Z23 Encounter for immunization: Secondary | ICD-10-CM | POA: Diagnosis not present

## 2018-09-07 DIAGNOSIS — N184 Chronic kidney disease, stage 4 (severe): Secondary | ICD-10-CM | POA: Diagnosis present

## 2018-09-07 DIAGNOSIS — E039 Hypothyroidism, unspecified: Secondary | ICD-10-CM | POA: Diagnosis not present

## 2018-09-07 DIAGNOSIS — R110 Nausea: Secondary | ICD-10-CM | POA: Diagnosis not present

## 2018-09-07 DIAGNOSIS — J189 Pneumonia, unspecified organism: Secondary | ICD-10-CM | POA: Diagnosis not present

## 2018-09-07 DIAGNOSIS — M1000 Idiopathic gout, unspecified site: Secondary | ICD-10-CM | POA: Diagnosis not present

## 2018-09-07 DIAGNOSIS — D631 Anemia in chronic kidney disease: Secondary | ICD-10-CM | POA: Diagnosis present

## 2018-09-07 DIAGNOSIS — L89326 Pressure-induced deep tissue damage of left buttock: Secondary | ICD-10-CM | POA: Diagnosis not present

## 2018-09-07 DIAGNOSIS — Y95 Nosocomial condition: Secondary | ICD-10-CM | POA: Diagnosis present

## 2018-09-07 DIAGNOSIS — I272 Pulmonary hypertension, unspecified: Secondary | ICD-10-CM | POA: Diagnosis present

## 2018-09-07 DIAGNOSIS — Z794 Long term (current) use of insulin: Secondary | ICD-10-CM | POA: Diagnosis not present

## 2018-09-07 DIAGNOSIS — K219 Gastro-esophageal reflux disease without esophagitis: Secondary | ICD-10-CM | POA: Diagnosis present

## 2018-09-07 DIAGNOSIS — R2689 Other abnormalities of gait and mobility: Secondary | ICD-10-CM | POA: Diagnosis not present

## 2018-09-07 DIAGNOSIS — I350 Nonrheumatic aortic (valve) stenosis: Secondary | ICD-10-CM | POA: Diagnosis present

## 2018-09-07 DIAGNOSIS — I509 Heart failure, unspecified: Secondary | ICD-10-CM

## 2018-09-07 DIAGNOSIS — L89316 Pressure-induced deep tissue damage of right buttock: Secondary | ICD-10-CM | POA: Diagnosis present

## 2018-09-07 DIAGNOSIS — I4819 Other persistent atrial fibrillation: Secondary | ICD-10-CM | POA: Diagnosis not present

## 2018-09-07 DIAGNOSIS — N390 Urinary tract infection, site not specified: Secondary | ICD-10-CM | POA: Diagnosis not present

## 2018-09-07 DIAGNOSIS — I4891 Unspecified atrial fibrillation: Secondary | ICD-10-CM | POA: Diagnosis not present

## 2018-09-07 DIAGNOSIS — Z7401 Bed confinement status: Secondary | ICD-10-CM | POA: Diagnosis not present

## 2018-09-07 DIAGNOSIS — I5033 Acute on chronic diastolic (congestive) heart failure: Secondary | ICD-10-CM | POA: Diagnosis not present

## 2018-09-07 DIAGNOSIS — M2550 Pain in unspecified joint: Secondary | ICD-10-CM | POA: Diagnosis not present

## 2018-09-07 LAB — RESPIRATORY PANEL BY PCR
Adenovirus: NOT DETECTED
BORDETELLA PERTUSSIS-RVPCR: NOT DETECTED
CHLAMYDOPHILA PNEUMONIAE-RVPPCR: NOT DETECTED
CORONAVIRUS HKU1-RVPPCR: NOT DETECTED
CORONAVIRUS NL63-RVPPCR: NOT DETECTED
Coronavirus 229E: NOT DETECTED
Coronavirus OC43: NOT DETECTED
INFLUENZA A H1 2009-RVPPR: NOT DETECTED
INFLUENZA A H3-RVPPCR: NOT DETECTED
INFLUENZA B-RVPPCR: NOT DETECTED
Influenza A H1: NOT DETECTED
Influenza A: NOT DETECTED
METAPNEUMOVIRUS-RVPPCR: NOT DETECTED
MYCOPLASMA PNEUMONIAE-RVPPCR: NOT DETECTED
PARAINFLUENZA VIRUS 2-RVPPCR: NOT DETECTED
PARAINFLUENZA VIRUS 3-RVPPCR: NOT DETECTED
Parainfluenza Virus 1: NOT DETECTED
Parainfluenza Virus 4: NOT DETECTED
RHINOVIRUS / ENTEROVIRUS - RVPPCR: NOT DETECTED
Respiratory Syncytial Virus: NOT DETECTED

## 2018-09-07 LAB — CBC WITH DIFFERENTIAL/PLATELET
ABS IMMATURE GRANULOCYTES: 0.16 10*3/uL — AB (ref 0.00–0.07)
Basophils Absolute: 0 10*3/uL (ref 0.0–0.1)
Basophils Relative: 0 %
EOS PCT: 0 %
Eosinophils Absolute: 0 10*3/uL (ref 0.0–0.5)
HEMATOCRIT: 23.8 % — AB (ref 36.0–46.0)
HEMOGLOBIN: 7 g/dL — AB (ref 12.0–15.0)
Immature Granulocytes: 1 %
LYMPHS PCT: 1 %
Lymphs Abs: 0.2 10*3/uL — ABNORMAL LOW (ref 0.7–4.0)
MCH: 28.7 pg (ref 26.0–34.0)
MCHC: 29.4 g/dL — AB (ref 30.0–36.0)
MCV: 97.5 fL (ref 80.0–100.0)
MONO ABS: 0.1 10*3/uL (ref 0.1–1.0)
Monocytes Relative: 1 %
NEUTROS ABS: 19.4 10*3/uL — AB (ref 1.7–7.7)
Neutrophils Relative %: 97 %
Platelets: 171 10*3/uL (ref 150–400)
RBC: 2.44 MIL/uL — ABNORMAL LOW (ref 3.87–5.11)
RDW: 18.9 % — ABNORMAL HIGH (ref 11.5–15.5)
WBC: 19.8 10*3/uL — AB (ref 4.0–10.5)
nRBC: 0 % (ref 0.0–0.2)

## 2018-09-07 LAB — BRAIN NATRIURETIC PEPTIDE: B Natriuretic Peptide: 1037.1 pg/mL — ABNORMAL HIGH (ref 0.0–100.0)

## 2018-09-07 LAB — URINALYSIS, ROUTINE W REFLEX MICROSCOPIC
Bilirubin Urine: NEGATIVE
GLUCOSE, UA: NEGATIVE mg/dL
Ketones, ur: NEGATIVE mg/dL
Nitrite: POSITIVE — AB
PROTEIN: NEGATIVE mg/dL
Specific Gravity, Urine: 1.008 (ref 1.005–1.030)
pH: 5 (ref 5.0–8.0)

## 2018-09-07 LAB — GLUCOSE, CAPILLARY
GLUCOSE-CAPILLARY: 309 mg/dL — AB (ref 70–99)
GLUCOSE-CAPILLARY: 230 mg/dL — AB (ref 70–99)
GLUCOSE-CAPILLARY: 269 mg/dL — AB (ref 70–99)
Glucose-Capillary: 300 mg/dL — ABNORMAL HIGH (ref 70–99)

## 2018-09-07 LAB — TSH: TSH: 1.911 u[IU]/mL (ref 0.350–4.500)

## 2018-09-07 LAB — I-STAT CG4 LACTIC ACID, ED: LACTIC ACID, VENOUS: 0.87 mmol/L (ref 0.5–1.9)

## 2018-09-07 LAB — STREP PNEUMONIAE URINARY ANTIGEN: Strep Pneumo Urinary Antigen: NEGATIVE

## 2018-09-07 LAB — TROPONIN I
Troponin I: <0.03 ng/mL (ref <0.03)
Troponin I: <0.03 ng/mL (ref <0.03)
Troponin I: 0.03 ng/mL (ref <0.03)

## 2018-09-07 LAB — HEMOGLOBIN AND HEMATOCRIT, BLOOD
HEMATOCRIT: 27 % — AB (ref 36.0–46.0)
Hemoglobin: 8 g/dL — ABNORMAL LOW (ref 12.0–15.0)

## 2018-09-07 LAB — PREPARE RBC (CROSSMATCH)

## 2018-09-07 LAB — MRSA PCR SCREENING: MRSA BY PCR: POSITIVE — AB

## 2018-09-07 LAB — LACTIC ACID, PLASMA: LACTIC ACID, VENOUS: 1.2 mmol/L (ref 0.5–1.9)

## 2018-09-07 MED ORDER — IPRATROPIUM-ALBUTEROL 0.5-2.5 (3) MG/3ML IN SOLN: 3.0000 mL | Freq: Once | RESPIRATORY_TRACT | Status: DC

## 2018-09-07 MED ORDER — SODIUM CHLORIDE 0.9% FLUSH: 3.0000 mL | INTRAVENOUS | Status: DC | PRN

## 2018-09-07 MED ORDER — ALBUTEROL SULFATE (2.5 MG/3ML) 0.083% IN NEBU
2.5000 mg | INHALATION_SOLUTION | Freq: Four times a day (QID) | RESPIRATORY_TRACT | Status: DC
  Administered 2018-09-07 – 2018-09-09 (×8): 2.5 mg via RESPIRATORY_TRACT
  Filled 2018-09-07 (×8): qty 3

## 2018-09-07 MED ORDER — ALLOPURINOL 100 MG PO TABS
100.0000 mg | ORAL_TABLET | Freq: Every day | ORAL | Status: DC
  Administered 2018-09-07 – 2018-09-14 (×8): 100 mg via ORAL
  Filled 2018-09-07 (×8): qty 1

## 2018-09-07 MED ORDER — APIXABAN 5 MG PO TABS
5.0000 mg | ORAL_TABLET | Freq: Two times a day (BID) | ORAL | Status: DC
  Administered 2018-09-07 – 2018-09-14 (×15): 5 mg via ORAL
  Filled 2018-09-07 (×15): qty 1

## 2018-09-07 MED ORDER — SODIUM CHLORIDE 0.9 % IV SOLN
1.0000 g | INTRAVENOUS | Status: DC
  Administered 2018-09-07 – 2018-09-13 (×7): 1 g via INTRAVENOUS
  Filled 2018-09-07 (×7): qty 1

## 2018-09-07 MED ORDER — AMIODARONE HCL 200 MG PO TABS
200.0000 mg | ORAL_TABLET | Freq: Every day | ORAL | Status: DC
  Administered 2018-09-07 – 2018-09-14 (×8): 200 mg via ORAL
  Filled 2018-09-07 (×8): qty 1

## 2018-09-07 MED ORDER — SODIUM CHLORIDE 0.9% IV SOLUTION
Freq: Once | INTRAVENOUS | Status: AC
  Administered 2018-09-07: 06:00:00 via INTRAVENOUS

## 2018-09-07 MED ORDER — EZETIMIBE-SIMVASTATIN 10-40 MG PO TABS
1.0000 | ORAL_TABLET | Freq: Every day | ORAL | Status: DC
  Administered 2018-09-07 – 2018-09-13 (×7): 1 via ORAL
  Filled 2018-09-07 (×7): qty 1

## 2018-09-07 MED ORDER — SODIUM CHLORIDE 0.9% FLUSH
3.0000 mL | Freq: Two times a day (BID) | INTRAVENOUS | Status: DC
  Administered 2018-09-07 – 2018-09-14 (×14): 3 mL via INTRAVENOUS

## 2018-09-07 MED ORDER — FUROSEMIDE 10 MG/ML IJ SOLN: 160.0000 mg | Freq: Once | INTRAVENOUS | Status: DC

## 2018-09-07 MED ORDER — INSULIN NPH (HUMAN) (ISOPHANE) 100 UNIT/ML ~~LOC~~ SUSP
20.0000 [IU] | Freq: Two times a day (BID) | SUBCUTANEOUS | Status: DC
  Administered 2018-09-07 – 2018-09-08 (×2): 20 [IU] via SUBCUTANEOUS
  Filled 2018-09-07 (×2): qty 10

## 2018-09-07 MED ORDER — METOPROLOL TARTRATE 25 MG PO TABS
50.0000 mg | ORAL_TABLET | Freq: Two times a day (BID) | ORAL | Status: DC
  Administered 2018-09-07 – 2018-09-14 (×15): 50 mg via ORAL
  Filled 2018-09-07 (×15): qty 2

## 2018-09-07 MED ORDER — DARBEPOETIN ALFA 300 MCG/0.6ML IJ SOSY
300.0000 ug | PREFILLED_SYRINGE | Freq: Once | INTRAMUSCULAR | Status: AC
  Administered 2018-09-07: 300 ug via SUBCUTANEOUS
  Filled 2018-09-07: qty 0.6

## 2018-09-07 MED ORDER — LEVOTHYROXINE SODIUM 25 MCG PO TABS
25.0000 ug | ORAL_TABLET | Freq: Every day | ORAL | Status: DC
  Administered 2018-09-07 – 2018-09-14 (×7): 25 ug via ORAL
  Filled 2018-09-07 (×8): qty 1

## 2018-09-07 MED ORDER — ONDANSETRON HCL 4 MG/2ML IJ SOLN: 4.0000 mg | Freq: Four times a day (QID) | INTRAMUSCULAR | Status: DC | PRN

## 2018-09-07 MED ORDER — SODIUM CHLORIDE 0.9 % IV SOLN: 500.0000 mg | Freq: Once | INTRAVENOUS | Status: DC

## 2018-09-07 MED ORDER — VANCOMYCIN HCL 10 G IV SOLR
1750.0000 mg | INTRAVENOUS | Status: DC
  Administered 2018-09-07 – 2018-09-09 (×2): 1750 mg via INTRAVENOUS
  Filled 2018-09-07 (×2): qty 1750

## 2018-09-07 MED ORDER — GUAIFENESIN ER 600 MG PO TB12
600.0000 mg | ORAL_TABLET | Freq: Two times a day (BID) | ORAL | Status: DC
  Administered 2018-09-07 – 2018-09-14 (×16): 600 mg via ORAL
  Filled 2018-09-07 (×16): qty 1

## 2018-09-07 MED ORDER — SODIUM CHLORIDE 0.9 % IV SOLN
250.0000 mL | INTRAVENOUS | Status: DC | PRN
  Administered 2018-09-07 – 2018-09-13 (×5): 250 mL via INTRAVENOUS

## 2018-09-07 MED ORDER — ACETAMINOPHEN 325 MG PO TABS
650.0000 mg | ORAL_TABLET | ORAL | Status: DC | PRN
  Administered 2018-09-09 – 2018-09-13 (×4): 650 mg via ORAL
  Filled 2018-09-07 (×4): qty 2

## 2018-09-07 MED ORDER — ARFORMOTEROL TARTRATE 15 MCG/2ML IN NEBU
15.0000 ug | INHALATION_SOLUTION | Freq: Two times a day (BID) | RESPIRATORY_TRACT | Status: DC
  Administered 2018-09-07 – 2018-09-14 (×15): 15 ug via RESPIRATORY_TRACT
  Filled 2018-09-07 (×15): qty 2

## 2018-09-07 MED ORDER — ALBUTEROL SULFATE (2.5 MG/3ML) 0.083% IN NEBU
2.5000 mg | INHALATION_SOLUTION | Freq: Four times a day (QID) | RESPIRATORY_TRACT | Status: DC | PRN
  Administered 2018-09-09: 2.5 mg via RESPIRATORY_TRACT
  Filled 2018-09-07: qty 3

## 2018-09-07 MED ORDER — FUROSEMIDE 10 MG/ML IJ SOLN
120.0000 mg | Freq: Two times a day (BID) | INTRAVENOUS | Status: DC
  Administered 2018-09-07 – 2018-09-10 (×8): 120 mg via INTRAVENOUS
  Filled 2018-09-07: qty 12
  Filled 2018-09-07: qty 2
  Filled 2018-09-07: qty 12
  Filled 2018-09-07 (×2): qty 10
  Filled 2018-09-07: qty 12
  Filled 2018-09-07 (×2): qty 2
  Filled 2018-09-07: qty 12

## 2018-09-07 MED ORDER — INSULIN ASPART 100 UNIT/ML ~~LOC~~ SOLN
0.0000 [IU] | Freq: Three times a day (TID) | SUBCUTANEOUS | Status: DC
  Administered 2018-09-07: 3 [IU] via SUBCUTANEOUS
  Administered 2018-09-07: 7 [IU] via SUBCUTANEOUS
  Administered 2018-09-07: 5 [IU] via SUBCUTANEOUS
  Administered 2018-09-08 (×2): 3 [IU] via SUBCUTANEOUS
  Administered 2018-09-08: 1 [IU] via SUBCUTANEOUS

## 2018-09-07 MED ORDER — FUROSEMIDE 10 MG/ML IJ SOLN
120.0000 mg | Freq: Once | INTRAVENOUS | Status: AC
  Administered 2018-09-07: 120 mg via INTRAVENOUS
  Filled 2018-09-07: qty 10

## 2018-09-07 MED ORDER — UMECLIDINIUM BROMIDE 62.5 MCG/INH IN AEPB
1.0000 | INHALATION_SPRAY | Freq: Every day | RESPIRATORY_TRACT | Status: DC
  Administered 2018-09-07 – 2018-09-09 (×3): 1 via RESPIRATORY_TRACT
  Filled 2018-09-07: qty 7

## 2018-09-07 MED ORDER — POTASSIUM CHLORIDE CRYS ER 20 MEQ PO TBCR
20.0000 meq | EXTENDED_RELEASE_TABLET | Freq: Three times a day (TID) | ORAL | Status: DC
  Administered 2018-09-07 – 2018-09-14 (×22): 20 meq via ORAL
  Filled 2018-09-07 (×22): qty 1

## 2018-09-07 MED ORDER — CALCIUM CARBONATE-VITAMIN D 500-200 MG-UNIT PO TABS
1.0000 | ORAL_TABLET | Freq: Every day | ORAL | Status: DC
  Administered 2018-09-07 – 2018-09-14 (×8): 1 via ORAL
  Filled 2018-09-07 (×8): qty 1

## 2018-09-07 NOTE — H&P (Signed)
History and Physical    Paige Hardin FAO:[id number] DOB: 05/17/1946 DOA: 09/06/2018  Referring MD/NP/PA: Duffy Bruce, MD PCP: Reymundo Poll, MD  Patient coming from: [facility] facility via EMS  Chief Complaint: Shortness of breath  I have personally briefly reviewed patient's old medical records in [place]   HPI: Paige Hardin is a 72 y.o. female with medical history significant of A. fib on Eliquis, COPD, oxygen dependent on 4 L, diastolic CHF, and iron deficiency anemia requiring frequent blood transfusions; who presents with complaints of progressively worsening shortness of breath and cough over the last 10 days.  Reports having productive cough with increased sputum production.  O2 sats were noted to be in the 80s on her regular oxygen requirements of 4 L.  Associated symptoms included fatigue, nausea, lower extremity edema, orthopnea, and fever at the facility of 100.5 F today. She tried utilizing multiple breathing treatments without relief of symptoms.  Any amount of exertion seems to worsen symptoms.  Denies having any chest pain, vomiting, or known recent sick contacts.  En route with EMS patient was increased to 5 L nasal cannula oxygen with some improvement O2 saturations, given 125 mg of Solu-Medrol, 5 albuterol, 300 mL of normal saline IV fluids.  ED Course: Upon admission to the emergency department patient was noted to be mildly tachypneic with respirations up to 23, blood pressure stable, O2 saturations 9196% on 5 L of nasal cannula oxygen.  Labs revealed WBC 23.1, hemoglobin 7.1(hemoglobin was 7.8 on 9/24)    Review of Systems  Constitutional: Positive for fever and malaise/fatigue.  HENT: Negative for nosebleeds and sinus pain.   Eyes: Negative for photophobia and pain.  Respiratory: Positive for cough, sputum production, shortness of breath and wheezing.   Cardiovascular: Positive for orthopnea and leg swelling. Negative for chest pain and PND.    Gastrointestinal: Positive for nausea. Negative for abdominal pain, diarrhea and vomiting.  Genitourinary: Negative for dysuria and hematuria.  Musculoskeletal: Negative for falls.  Neurological: Negative for tremors and loss of consciousness.  Psychiatric/Behavioral: Negative for hallucinations and substance abuse.    Past Medical History:  Diagnosis Date  . Arthritis    "joints" (01/14/2014)  . Carotid atherosclerosis   . Chronic low back pain   . COPD (chronic obstructive pulmonary disease) ([place])   . Depression    "maybe" (01/14/2014  . Diastolic congestive heart failure ([place])   . Emphysema   . Excessive daytime sleepiness 09/08/2015  . GERD (gastroesophageal reflux disease)   . Hoarseness, chronic   . Hyperlipidemia   . Lung nodules   . Obese   . Paroxysmal atrial fibrillation ([facility])    a. failed flecainide;  b. 03/2014 amio started->DCCV;  c. Chronic pradaxa.  . Pneumonia 1990's   "once"  . Type 2 diabetes mellitus ([place])     Past Surgical History:  Procedure Laterality Date  . BREAST BIOPSY Left ~ 1980   "solid"  . BREAST LUMPECTOMY Left ~ 1980   "removed a mass; it was benign" (01/14/2014)  . CARDIOVERSION N/A 03/11/2014   Procedure: CARDIOVERSION;  Surgeon: Sanda Klein, MD;  Location: [facility];  Service: Cardiovascular;  Laterality: N/A;  . CARDIOVERSION N/A 03/27/2014   Procedure: CARDIOVERSION;  Surgeon: Darlin Coco, MD;  Location: [facility];  Service: Cardiovascular;  Laterality: N/A;  . COLONOSCOPY  01/26/2012   Procedure: COLONOSCOPY;  Surgeon: Beryle Beams, MD;  Location: [facility];  Service: Endoscopy;  Laterality: N/A;  . ESOPHAGOGASTRODUODENOSCOPY  01/26/2012  Procedure: ESOPHAGOGASTRODUODENOSCOPY (EGD);  Surgeon: Beryle Beams, MD;  Location: [facility];  Service: Endoscopy;  Laterality: N/A;  . TOTAL ABDOMINAL HYSTERECTOMY  1994     reports that she quit smoking about 9 years ago. Her smoking use included cigarettes. She started smoking about 54 years  ago. She has a 135.00 pack-year smoking history. She has never used smokeless tobacco. She reports that she drinks alcohol. She reports that she does not use drugs.  Allergies  Allergen Reactions  . Ace Inhibitors Cough  . Clindamycin Rash    Family History  Problem Relation Age of Onset  . Breast cancer Sister   . Heart disease Unknown   . Hodgkin's lymphoma Unknown   . Asthma Mother     Prior to Admission medications   Medication Sig Start Date End Date Taking? Authorizing Provider  acetaminophen (TYLENOL) 325 MG tablet Take 325 mg by mouth daily as needed for mild pain.     [provider]  albuterol (PROVENTIL HFA;VENTOLIN HFA) 108 (90 Base) MCG/ACT inhaler Inhale 2 puffs into the lungs every 6 (six) hours as needed for wheezing or shortness of breath.    [provider]  albuterol (PROVENTIL) (2.5 MG/3ML) 0.083% nebulizer solution Take 2.5 mg by nebulization every 6 (six) hours as needed for wheezing or shortness of breath.    [provider]  allopurinol (ZYLOPRIM) 100 MG tablet Take 1 tablet (100 mg total) by mouth daily. 10/16/17   [place], Holli Humbles, NP  amiodarone (PACERONE) 200 MG tablet Take 1 tablet (200 mg total) by mouth daily. 04/23/14   Richardson Dopp T, PA-C  apixaban (ELIQUIS) 5 MG TABS tablet Take 1 tablet (5 mg total) by mouth 2 (two) times daily. 04/03/14   [place]or, Grace Bushy, NP  Ascorbic Acid (VITAMIN C) 500 MG CAPS Take 500 mg by mouth daily.     [provider]  calcium-vitamin D (OSCAL WITH D) 500-200 MG-UNIT per tablet Take 1 tablet by mouth daily with breakfast.    [provider]  Cholecalciferol (VITAMIN D) 2000 UNITS tablet Take 2,000 Units by mouth daily.    [provider]  diltiazem (CARDIZEM CD) 180 MG 24 hr capsule Take 1 capsule (180 mg total) by mouth daily. 03/12/14   Edmisten, Brooke O, PA-C  ezetimibe-simvastatin (VYTORIN) 10-40 MG per tablet Take 1 tablet by mouth at bedtime.     [provider]  furosemide (LASIX) 80 MG tablet Take 2 tablets (160 mg total) by mouth 2 (two) times daily. MAKE SURE TO TAKE AT 8 AM AND 2 PM 04/23/14   Richardson Dopp T, PA-C  guaiFENesin (DIABETIC TUSSIN EX) 100 MG/5ML liquid Take 200 mg by mouth 3 (three) times daily as needed for cough.    [provider]  insulin aspart (NOVOLOG) 100 UNIT/ML injection Inject 0-9 Units into the skin 3 (three) times daily with meals. Correction factor Sliding scale CBG 70 - 120: 0 units CBG 121 - 150: 1 unit,  CBG 151 - 200: 2 units,  CBG 201 - 250: 3 units,  CBG 251 - 300: 5 units,  CBG 301 - 350: 7 units,  CBG 351 - 400: 9 units   CBG > 400: 9 units and notify your MD 11/19/15   Rai, Vernelle Emerald, MD  insulin NPH Human (HUMULIN N,NOVOLIN N) 100 UNIT/ML injection Inject 20 Units into the skin 2 (two) times daily before a meal.  04/04/16   [provider]  levothyroxine (LEVOXYL) 25 MCG  tablet Take 1 tablet (25 mcg total) by mouth daily before breakfast. 06/24/18   Bensimhon, Shaune Pascal, MD  metolazone (ZAROXOLYN) 2.5 MG tablet Take 1 tablet every Wednesday 06/11/18   Larey Dresser, MD  metoprolol (LOPRESSOR) 50 MG tablet Take 1 tablet (50 mg total) by mouth 2 (two) times daily. 03/12/14   Edmisten, Azzie Roup, PA-C  Multiple Vitamins-Iron (MULTIVITAMIN/IRON PO) Take 1 tablet by mouth daily.    [provider]  omeprazole (PRILOSEC) 20 MG capsule Take 20 mg by mouth daily.    [provider]  ondansetron (ZOFRAN) 4 MG tablet every 8 (eight) hours as needed.  12/04/17   [provider]  OXYGEN Inhale 4 L into the lungs continuous.     [provider]  potassium chloride SA (K-DUR,KLOR-CON) 20 MEQ tablet Take 20 mEq by mouth 3 (three) times daily. MAKE SURE TO TAKE EXTRA 20 MEQ ON METOLAZONE DAYS  04/23/14   Richardson Dopp T, PA-C  Tiotropium Bromide-Olodaterol (STIOLTO RESPIMAT) 2.5-2.5 MCG/ACT AERS Inhale 2 puffs into the lungs daily. 10/26/17   Collene Gobble, MD  vitamin  B-12 (CYANOCOBALAMIN) 500 MCG tablet Take 500 mcg by mouth daily.    [provider]    Physical Exam:  Constitutional: Moderately obese female who is able to follow commands Vitals:   09/07/18 0000 09/07/18 0030 09/07/18 0100 09/07/18 0130  BP: (!) 117/49 (!) 115/50 (!) 110/44 (!) 109/55  Pulse: 73 73 73 72  Resp: 18 17 20 19   SpO2: 91% 93% 94% 94%   Eyes: PERRL, lids and conjunctivae normal ENMT: Mucous membranes are moist. Posterior pharynx clear of any exudate or lesions. Neck: normal, supple, no masses, no thyromegaly unable to appreciate JVD Respiratory: Decreased overall aeration with mild wheezes and bilateral crackles appreciated.  Patient currently on 5 L nasal cannula oxygen. Cardiovascular: Regular rate and rhythm, no murmurs / rubs / gallops.  +2 lower extremity edema. 2+ pedal pulses. No carotid bruits.  Abdomen: no tenderness, no masses palpated. No hepatosplenomegaly. Bowel sounds positive.  Musculoskeletal: Clubbing present.  No joint deformity upper and lower extremities. Good ROM, no contractures. Normal muscle tone.  Skin: Appears to have venous stasis changes of the lower extremities Neurologic: CN 2-12 grossly intact. Sensation intact, DTR normal. Strength 5/5 in all 4.  Psychiatric: Normal judgment and insight. Alert and oriented x 3. Normal mood.     Labs on Admission: I have personally reviewed following labs and imaging studies  CBC: Recent Labs  Lab 09/06/18 2238  WBC 23.1*  NEUTROABS 21.9*  HGB 7.1*  HCT 24.9*  MCV 98.8  PLT 267   Basic Metabolic Panel: Recent Labs  Lab 09/06/18 2238  NA 133*  K 4.4  CL 89*  CO2 34*  GLUCOSE 195*  BUN 54*  CREATININE 2.19*  CALCIUM 8.9   GFR: CrCl cannot be calculated (Unknown ideal weight.). Liver Function Tests: Recent Labs  Lab 09/06/18 2238  AST 22  ALT 13  ALKPHOS 50  BILITOT 1.1  PROT 6.5  ALBUMIN 3.0*   No results for input(s): LIPASE, AMYLASE in the last 168 hours. No  results for input(s): AMMONIA in the last 168 hours. Coagulation Profile: No results for input(s): INR, PROTIME in the last 168 hours. Cardiac Enzymes: No results for input(s): CKTOTAL, CKMB, CKMBINDEX, TROPONINI in the last 168 hours. BNP (last 3 results) No results for input(s): PROBNP in the last 8760 hours. HbA1C: No results for input(s): HGBA1C in the last 72 hours. CBG:  No results for input(s): GLUCAP in the last 168 hours. Lipid Profile: No results for input(s): CHOL, HDL, LDLCALC, TRIG, CHOLHDL, LDLDIRECT in the last 72 hours. Thyroid Function Tests: No results for input(s): TSH, T4TOTAL, FREET4, T3FREE, THYROIDAB in the last 72 hours. Anemia Panel: No results for input(s): VITAMINB12, FOLATE, FERRITIN, TIBC, IRON, RETICCTPCT in the last 72 hours. Urine analysis:    Component Value Date/Time   COLORURINE YELLOW 10/17/2016 [place] 10/17/2016 1608   LABSPEC 1.008 10/17/2016 1608   PHURINE 6.0 10/17/2016 1608   GLUCOSEU NEGATIVE 10/17/2016 1608   HGBUR MODERATE (A) 10/17/2016 1608   BILIRUBINUR NEGATIVE 10/17/2016 [place] 10/17/2016 1608   PROTEINUR NEGATIVE 10/17/2016 1608   UROBILINOGEN 1.0 03/23/2014 1915   NITRITE POSITIVE (A) 10/17/2016 1608   LEUKOCYTESUR MODERATE (A) 10/17/2016 1608   Sepsis Labs: No results found for this or any previous visit (from the past 240 hour(s)).   Radiological Exams on Admission: Dg Chest 2 View  Result Date: 09/06/2018 CLINICAL DATA:  72 year old female presents with dyspnea. EXAM: CHEST - 2 VIEW COMPARISON:  Chest CT 12/18/2017, CXR 10/17/2016 FINDINGS: The cardiopericardial silhouette is enlarged as before. Diffuse interstitial edema is accentuated on current exam. Moderate aortic atherosclerosis with slight uncoiling. No aortic aneurysm. No acute osseous abnormality. No significant pleural effusion or pneumothorax. The posterior costophrenic angles are excluded on the lateral view. IMPRESSION:  Cardiomegaly with diffuse interstitial pulmonary edema. Aortic atherosclerosis without aneurysm. Electronically Signed   By: Ashley Royalty M.D.   On: 09/06/2018 23:32    EKG: Independently reviewed.  Sinus rhythm at 76 bpm with QTc 544  Assessment/Plan Respiratory failure with hypoxia 2/2 diastolic congestive heart failure exacerbation: Acute on chronic.  Patient requiring increased oxygen 5-6 L to maintain O2 saturations were normally on 4 L.  Normally patient on 160 mg of Lasix twice daily.  BNP elevated at 1037.1.  Chest x-ray showing cardiomegaly with diffuse interstitial pulmonary edema.  Last echocardiogram noted on 06/11/2018 noted EF of 55 to 60% with grade 2 diastolic dysfunction. - Admit to a telemetry bed - Heart failure orders set  initiated  - Continuous pulse oximetry with nasal cannula oxygen as needed to keep O2 saturations >92% - Strict I&Os and daily weights - Elevate lower extremities - Lasix 120 mg IV twice daily - Reassess in a.m. and adjust diuresis as needed. - Address need of echocardiogram in a.m. - Optimize medical management as able - May warrant consultation to cardiology in a.m.   SIRS/sepsis 2/2 suspected healthcare associated pneumonia: Acute.  Patient with elevated [place] blood cell count of 23.1 on admission.  Patient had been given Solu-Medrol prior to admission.  No reports of fever. - Follow-up blood and sputum studies  - Continue empiric antibiotics of vancomycin, cefepime - Did not place on azithromycin due to prolonged QTc - De-escalate when medically appropriate  COPD exacerbation: Acute on chronic.  On physical exam found to be wheezing.  Patient was given 125 mg of Solu-Medrol IV in route with breathing treatments.   - Scheduled albuterol nebs 4 times daily - Brovana nebs - Incruse - Reassess for need of continued IV steroids given history of diabetes  Prolonged QT interval: Acute initial QT noted to be 544 on admission. - Recheck EKG in  a.m.  Iron deficiency anemia: Acute on chronic.  Patient presents with a hemoglobin of 7.1 previously was 7.8 on 9/24 She is followed by Dr. Marin Olp of oncology and last received a  transfusion of Darbepoetin on 9/24. - Continue with transfusion 1 unit of packed red blood cells  - Recheck CBC in a.m. following transfusion  Paroxysmal atrial fibrillation on chronic anticoagulation: Patient appears to be in sinus rhythm at this time. - Eliquis and amiodarone  Diabetes mellitus type II: Patient's initial glucose elevated 195 on admission.  Last hemoglobin A1c appears to be from 10/2015 and noted to be 9.7.   - Hypoglycemic protocols - Continue NPH 20 units twice daily before meals - CBGs q. before meals with sensitive SSI  Chronic kidney disease stage IV: Patient presents with a creatinine of 2.19.  It appears patient's creatinine has ranged from 1.6-2.7 over the last 4 months. - Recheck kidney function in a.m.  Hypothyroidism: TSH noted be 5.832 on 06/11/2018. - Recheck TSH - Continue levothyroxine - May need adjustment of dose  GERD - Held PPI due to QTc  Morbid obesity: BMI 51.09  DVT prophylaxis: Eliquis Code Status: DNR Family Communication:  No family present at bedside Disposition Plan: Likely discharge back to skilled nursing facility once medically stable Consults called: none  Admission status: inpatient  Norval Morton MD [facility] Pager [phone]   If 7PM-7AM, please contact night-coverage [email or web address] Password [email or web address]  09/07/2018, 1:52 AM

## 2018-09-07 NOTE — ED Notes (Signed)
Report called to rn on 3e 

## 2018-09-07 NOTE — ED Notes (Signed)
Admitting doctor at  The bedside  Dr Tamala Julian

## 2018-09-07 NOTE — Progress Notes (Signed)
Lab called for an add-on urine culture from the sample that was collected in the ED, troponin 0.03 MD aware.

## 2018-09-07 NOTE — ED Notes (Signed)
Delay in lab draw,  registration at bedside.

## 2018-09-07 NOTE — Progress Notes (Signed)
Received a call from lab that patient's PCR swab for MRSA is positive, patient is now on contact precautions, charge nurse is aware.

## 2018-09-07 NOTE — Progress Notes (Signed)
[facility]                                                                              Patient Demographics  Paige Hardin, is a 72 y.o. female, DOB - 09/22/1946, LKJ:[id number]  Admit date - 09/06/2018   Admitting Physician Norval Morton, MD  Outpatient Primary MD for the patient is Reymundo Poll, MD  Outpatient specialists:   LOS - 0  days   Medical records reviewed and are as summarized below:    Chief Complaint  Patient presents with  . Shortness of Breath       Brief summary   Patient is a 72 year old female with history of A. fib on Eliquis, COPD, chronic respiratory failure, oxygen dependent 4 L, diastolic CHF, iron deficiency anemia requiring frequent blood transfusions, follows Dr. Marin Olp, presented with progressively worsening shortness of breath and cough over the last 10 days.  Patient lives in a retirement home/ALF.  She reported productive cough, hypoxia in 80s on her regular O2, fatigue, nausea, lower extremity edema, orthopnea, sleeping in recliner and fever of 100.5 F.  She used multiple breathing treatments with no significant relief.  In ED, patient was placed on O2 5 L, was given prolonged albuterol treatment with salmeterol.  WBCs 23.1 at the time of admission.   Assessment & Plan    Principal Problem: Acute on chronic respiratory failure with hypoxia secondary to acute on chronic diastolic CHF, symptomatic anemia, HCAP -2D echo 06/11/2018 showed EF of 55 to 60% with grade 2 diastolic dysfunction -O2 sats currently 92% on 5 L, wean O2 as tolerated, continue management as below   Active problems Acute on chronic diastolic CHF exacerbation -BNP 1037, chest x-ray showing cardiomegaly with pulmonary edema, peripheral edema with orthopnea -Continue IV Lasix 120 mg twice daily, -Strict I's and O's and daily weights.   Sepsis secondary to suspected HCAP, COPD exacerbation -Influenza negative, respiratory virus panel negative, follow blood  cultures, sputum culture  -Urine strep antigen negative -Continue scheduled nebs, Brovana, IV vancomycin, cefepime -Flutter valve, incruse    UTI -UA positive for UTI, many bacteria, WBCs 21-50, positive leukocytes, nitrite -Follow urine cultures, add-on, continue IV cefepime   Prolonged QTC Initial EKG showed QTC 544, repeat in a.m. 516   Iron deficiency anemia, acute on chronic -Followed by Dr. Marin Olp, has received as needed transfusions, last received darbepoetin infusion on 9/24 -Baseline  ~ 8  -Transfuse 1 unit packed RBCs.  Reviewed Dr. Antonieta Pert recommendations, will give 1 more dose of Aranesp today and subsequent outpatient follow-up with hematology   Paroxysmal atrial fibrillation -Currently normal sinus rhythm, continue amiodarone -Continue Eliquis   Diabetes mellitus type 2, uncontrolled -Continue NPH insulin, sliding scale insulin Follow hemoglobin A1c  CKD stage IV Creatinine 2.1 at the time of admission, baseline ranged from 1.6-2.7 -Follow closely with diuresis  Hypothyroidism -Continue levothyroxine, TSH 1.9  GERD Held PPI secondary to prolonged QTC  Morbid obesity BMI 51, counseled on diet and weight control   Code Status: DNR DVT Prophylaxis: Apixaban Family Communication: Discussed in detail with the patient, all imaging results, lab results explained to the patient  Disposition Plan: Pending clinical improvement  Time Spent in minutes 40 minutes  Procedures:  None  Consultants:   None  Antimicrobials:   IV vancomycin, IV cefepime 10/12   Medications  Scheduled Meds: . albuterol  2.5 mg Nebulization QID  . allopurinol  100 mg Oral Daily  . amiodarone  200 mg Oral Daily  . apixaban  5 mg Oral BID  . arformoterol  15 mcg Nebulization BID  . calcium-vitamin D  1 tablet Oral Q breakfast  . ezetimibe-simvastatin  1 tablet Oral QHS  . guaiFENesin  600 mg Oral BID  . insulin aspart  0-9 Units Subcutaneous TID WC  .  insulin NPH Human  20 Units Subcutaneous BID AC & HS  . ipratropium-albuterol  3 mL Nebulization Once  . levothyroxine  25 mcg Oral QAC breakfast  . metoprolol tartrate  50 mg Oral BID  . potassium chloride SA  20 mEq Oral TID  . sodium chloride flush  3 mL Intravenous Q12H  . umeclidinium bromide  1 puff Inhalation Daily   Continuous Infusions: . sodium chloride 250 mL (09/07/18 1034)  . ceFEPime (MAXIPIME) IV 1 g (09/07/18 0531)  . furosemide    . vancomycin 1,750 mg (09/07/18 0652)   PRN Meds:.sodium chloride, acetaminophen, albuterol, sodium chloride flush   Antibiotics   Anti-infectives (From admission, onward)   Start     Dose/Rate Route Frequency Ordered Stop   09/07/18 0300  ceFEPIme (MAXIPIME) 1 g in sodium chloride 0.9 % 100 mL IVPB     1 g 200 mL/hr over 30 Minutes Intravenous Every 24 hours 09/07/18 0234 09/15/18 0259   09/07/18 0300  vancomycin (VANCOCIN) 1,750 mg in sodium chloride 0.9 % 500 mL IVPB     1,750 mg 250 mL/hr over 120 Minutes Intravenous Every 48 hours 09/07/18 0255     09/07/18 0245  azithromycin (ZITHROMAX) 500 mg in sodium chloride 0.9 % 250 mL IVPB  Status:  Discontinued     500 mg 250 mL/hr over 60 Minutes Intravenous  Once 09/07/18 0234 09/07/18 0302        Subjective:   Paige Hardin was seen and examined today.  Feeling slightly better this morning, sitting up in the bed, states has orthopnea and feels short of breath on lying in the bed.  No pain, abdominal pain, nausea or vomiting.  No fevers this morning   Objective:   Vitals:   09/07/18 0736 09/07/18 0812 09/07/18 0815 09/07/18 0843  BP: (!) 127/53 (!) 111/42  (!) 130/57  Pulse: 76 76  83  Resp: (!) 24 20    Temp: 98.4 F (36.9 C) 98.3 F (36.8 C)    TempSrc: Oral Oral    SpO2: 92% 91% 92%   Weight:      Height:        Intake/Output Summary (Last 24 hours) at 09/07/2018 1036 Last data filed at 09/07/2018 0800 Gross per 24 hour  Intake 120.59 ml  Output 500 ml  Net -379.41  ml     Wt Readings from Last 3 Encounters:  09/07/18 (!) 139.3 kg  08/20/18 (!) 136.7 kg  07/23/18 (!) 137.5 kg     Exam  General: Alert and oriented x 3, NAD  Eyes:   HEENT:  Atraumatic, normocephalic  Cardiovascular: S1 S2 auscultated, Regular rate and rhythm.  Respiratory: No wheezing, bibasilar crackles  Gastrointestinal: Soft, nontender, nondistended, + bowel sounds, obese  Ext: 2+  pedal edema bilaterally  Neuro: no new deficits  Musculoskeletal:  No digital cyanosis, clubbing  Skin: Chronic venous stasis changes on the lower extremities  Psych: Normal affect and demeanor, alert and oriented x3    Data Reviewed:  I have personally reviewed following labs and imaging studies  Micro Results Recent Results (from the past 240 hour(s))  Respiratory Panel by PCR     Status: None   Collection Time: 09/07/18  3:05 AM  Result Value Ref Range Status   Adenovirus NOT DETECTED NOT DETECTED Final   Coronavirus 229E NOT DETECTED NOT DETECTED Final   Coronavirus HKU1 NOT DETECTED NOT DETECTED Final   Coronavirus NL63 NOT DETECTED NOT DETECTED Final   Coronavirus OC43 NOT DETECTED NOT DETECTED Final   Metapneumovirus NOT DETECTED NOT DETECTED Final   Rhinovirus / Enterovirus NOT DETECTED NOT DETECTED Final   Influenza A NOT DETECTED NOT DETECTED Final   Influenza A H1 NOT DETECTED NOT DETECTED Final   Influenza A H1 2009 NOT DETECTED NOT DETECTED Final   Influenza A H3 NOT DETECTED NOT DETECTED Final   Influenza B NOT DETECTED NOT DETECTED Final   Parainfluenza Virus 1 NOT DETECTED NOT DETECTED Final   Parainfluenza Virus 2 NOT DETECTED NOT DETECTED Final   Parainfluenza Virus 3 NOT DETECTED NOT DETECTED Final   Parainfluenza Virus 4 NOT DETECTED NOT DETECTED Final   Respiratory Syncytial Virus NOT DETECTED NOT DETECTED Final   Bordetella pertussis NOT DETECTED NOT DETECTED Final   Chlamydophila pneumoniae NOT DETECTED NOT DETECTED Final   Mycoplasma pneumoniae NOT  DETECTED NOT DETECTED Final    Comment: Performed at [facility], [address] [place], [place] [address]  MRSA PCR Screening     Status: Abnormal   Collection Time: 09/07/18  6:25 AM  Result Value Ref Range Status   MRSA by PCR POSITIVE (A) NEGATIVE Final    Comment:        The GeneXpert MRSA Assay (FDA approved for NASAL specimens only), is one component of a comprehensive MRSA colonization surveillance program. It is not intended to diagnose MRSA infection nor to guide or monitor treatment for MRSA infections. RESULT CALLED TO, READ BACK BY AND VERIFIED WITH: RN Z IDI [id number] [phone] MLM Performed at [facility], [place] [address] [place], [place] [address]     Radiology Reports Dg Chest 2 View  Result Date: 09/06/2018 CLINICAL DATA:  72 year old female presents with dyspnea. EXAM: CHEST - 2 VIEW COMPARISON:  Chest CT 12/18/2017, CXR 10/17/2016 FINDINGS: The cardiopericardial silhouette is enlarged as before. Diffuse interstitial edema is accentuated on current exam. Moderate aortic atherosclerosis with slight uncoiling. No aortic aneurysm. No acute osseous abnormality. No significant pleural effusion or pneumothorax. The posterior costophrenic angles are excluded on the lateral view. IMPRESSION: Cardiomegaly with diffuse interstitial pulmonary edema. Aortic atherosclerosis without aneurysm. Electronically Signed   By: Ashley Royalty M.D.   On: 09/06/2018 23:32    Lab Data:  CBC: Recent Labs  Lab 09/06/18 2238 09/07/18 0709  WBC 23.1* 19.8*  NEUTROABS 21.9* 19.4*  HGB 7.1* 7.0*  HCT 24.9* 23.8*  MCV 98.8 97.5  PLT 189 573   Basic Metabolic Panel: Recent Labs  Lab 09/06/18 2238  NA 133*  K 4.4  CL 89*  CO2 34*  GLUCOSE 195*  BUN 54*  CREATININE 2.19*  CALCIUM 8.9   GFR: Estimated Creatinine Clearance: 32.5 mL/min (A) (by C-G formula based on SCr of 2.19 mg/dL (H)). Liver Function Tests: Recent Labs  Lab 09/06/18 2238  AST 22  ALT  13  ALKPHOS  50  BILITOT 1.1  PROT 6.5  ALBUMIN 3.0*   No results for input(s): LIPASE, AMYLASE in the last 168 hours. No results for input(s): AMMONIA in the last 168 hours. Coagulation Profile: No results for input(s): INR, PROTIME in the last 168 hours. Cardiac Enzymes: Recent Labs  Lab 09/07/18 0035 09/07/18 0251 09/07/18 0709  TROPONINI <0.03 <0.03 <0.03   BNP (last 3 results) No results for input(s): PROBNP in the last 8760 hours. HbA1C: No results for input(s): HGBA1C in the last 72 hours. CBG: Recent Labs  Lab 09/07/18 0737  GLUCAP 309*   Lipid Profile: No results for input(s): CHOL, HDL, LDLCALC, TRIG, CHOLHDL, LDLDIRECT in the last 72 hours. Thyroid Function Tests: Recent Labs    09/07/18 0419  TSH 1.911   Anemia Panel: No results for input(s): VITAMINB12, FOLATE, FERRITIN, TIBC, IRON, RETICCTPCT in the last 72 hours. Urine analysis:    Component Value Date/Time   COLORURINE YELLOW 09/07/2018 [place] 09/07/2018 0717   LABSPEC 1.008 09/07/2018 0717   PHURINE 5.0 09/07/2018 0717   GLUCOSEU NEGATIVE 09/07/2018 0717   HGBUR SMALL (A) 09/07/2018 0717   BILIRUBINUR NEGATIVE 09/07/2018 0717   KETONESUR NEGATIVE 09/07/2018 0717   PROTEINUR NEGATIVE 09/07/2018 0717   UROBILINOGEN 1.0 03/23/2014 1915   NITRITE POSITIVE (A) 09/07/2018 0717   LEUKOCYTESUR LARGE (A) 09/07/2018 0717     Danial Hlavac M.D. [facility] 09/07/2018, 10:36 AM  Pager: [phone] Between 7am to 7pm - call Pager - [phone]  After 7pm go to [email or web address] - password [email or web address]  Call night coverage person covering after 7pm

## 2018-09-07 NOTE — Progress Notes (Signed)
Pharmacy Antibiotic Note  Paige Hardin is a 72 y.o. female admitted on 09/06/2018 with pneumonia.  Pharmacy has been consulted for Vancomycin dosing. WBC elevated. Noted renal dysfunction. Normalized CrCL ~25-30.   Plan: Vancomycin 1750 mg IV q48h Cefepime per MD Trend WBC, temp, renal function  F/U infectious work-up Drug levels as indicated  No data recorded.  Recent Labs  Lab 09/06/18 2238 09/06/18 2250 09/07/18 0053  WBC 23.1*  --   --   CREATININE 2.19*  --   --   LATICACIDVEN  --  1.34 0.87    CrCl cannot be calculated (Unknown ideal weight.).    Allergies  Allergen Reactions  . Ace Inhibitors Cough  . Clindamycin Rash   Narda Bonds 09/07/2018 2:50 AM

## 2018-09-08 DIAGNOSIS — I480 Paroxysmal atrial fibrillation: Secondary | ICD-10-CM

## 2018-09-08 DIAGNOSIS — I5033 Acute on chronic diastolic (congestive) heart failure: Secondary | ICD-10-CM

## 2018-09-08 LAB — BASIC METABOLIC PANEL
Anion gap: 13 (ref 5–15)
BUN: 65 mg/dL — ABNORMAL HIGH (ref 8–23)
CALCIUM: 9.1 mg/dL (ref 8.9–10.3)
CHLORIDE: 88 mmol/L — AB (ref 98–111)
CO2: 34 mmol/L — ABNORMAL HIGH (ref 22–32)
CREATININE: 2.2 mg/dL — AB (ref 0.44–1.00)
GFR calc Af Amer: 24 mL/min — ABNORMAL LOW (ref >60)
GFR calc non Af Amer: 21 mL/min — ABNORMAL LOW (ref >60)
GLUCOSE: 231 mg/dL — AB (ref 70–99)
Potassium: 4.1 mmol/L (ref 3.5–5.1)
SODIUM: 135 mmol/L (ref 135–145)

## 2018-09-08 LAB — URINE CULTURE

## 2018-09-08 LAB — GLUCOSE, CAPILLARY
GLUCOSE-CAPILLARY: 217 mg/dL — AB (ref 70–99)
GLUCOSE-CAPILLARY: 233 mg/dL — AB (ref 70–99)
GLUCOSE-CAPILLARY: 140 mg/dL — AB (ref 70–99)
GLUCOSE-CAPILLARY: 171 mg/dL — AB (ref 70–99)

## 2018-09-08 LAB — CBC
HEMATOCRIT: 26.3 % — AB (ref 36.0–46.0)
Hemoglobin: 7.9 g/dL — ABNORMAL LOW (ref 12.0–15.0)
MCH: 28.7 pg (ref 26.0–34.0)
MCHC: 30 g/dL (ref 30.0–36.0)
MCV: 95.6 fL (ref 80.0–100.0)
Platelets: 189 10*3/uL (ref 150–400)
RBC: 2.75 MIL/uL — ABNORMAL LOW (ref 3.87–5.11)
RDW: 18.8 % — AB (ref 11.5–15.5)
WBC: 13.8 10*3/uL — ABNORMAL HIGH (ref 4.0–10.5)
nRBC: 0 % (ref 0.0–0.2)

## 2018-09-08 LAB — HEMOGLOBIN A1C
Hgb A1c MFr Bld: 4.9 % (ref 4.8–5.6)
Mean Plasma Glucose: 93.93 mg/dL

## 2018-09-08 LAB — EXPECTORATED SPUTUM ASSESSMENT W REFEX TO RESP CULTURE

## 2018-09-08 LAB — EXPECTORATED SPUTUM ASSESSMENT W GRAM STAIN, RFLX TO RESP C

## 2018-09-08 MED ORDER — PROMETHAZINE HCL 25 MG/ML IJ SOLN
12.5000 mg | Freq: Four times a day (QID) | INTRAMUSCULAR | Status: DC | PRN
  Administered 2018-09-08 – 2018-09-12 (×2): 12.5 mg via INTRAVENOUS
  Filled 2018-09-08 (×3): qty 1

## 2018-09-08 MED ORDER — INSULIN NPH (HUMAN) (ISOPHANE) 100 UNIT/ML ~~LOC~~ SUSP
25.0000 [IU] | Freq: Two times a day (BID) | SUBCUTANEOUS | Status: DC
  Administered 2018-09-08 (×2): 25 [IU] via SUBCUTANEOUS
  Filled 2018-09-08: qty 10

## 2018-09-08 MED ORDER — MUPIROCIN 2 % EX OINT
1.0000 "application " | TOPICAL_OINTMENT | Freq: Two times a day (BID) | CUTANEOUS | Status: AC
  Administered 2018-09-08 – 2018-09-13 (×10): 1 via NASAL
  Filled 2018-09-08 (×2): qty 22

## 2018-09-08 MED ORDER — CHLORHEXIDINE GLUCONATE CLOTH 2 % EX PADS
6.0000 | MEDICATED_PAD | Freq: Every day | CUTANEOUS | Status: AC
  Administered 2018-09-09 – 2018-09-13 (×3): 6 via TOPICAL

## 2018-09-08 MED ORDER — GUAIFENESIN-DM 100-10 MG/5ML PO SYRP
5.0000 mL | ORAL_SOLUTION | ORAL | Status: DC | PRN
  Administered 2018-09-08 – 2018-09-12 (×8): 5 mL via ORAL
  Filled 2018-09-08 (×8): qty 5

## 2018-09-08 MED ORDER — ONDANSETRON HCL 4 MG/2ML IJ SOLN: 4.0000 mg | Freq: Four times a day (QID) | INTRAMUSCULAR | Status: DC | PRN

## 2018-09-08 NOTE — Progress Notes (Signed)
[facility]                                                                              Patient Demographics  Paige Hardin, is a 72 y.o. female, DOB - [birth year], UXL:[id number]  Admit date - 09/06/2018   Admitting Physician Norval Morton, MD  Outpatient Primary MD for the patient is Reymundo Poll, MD  Outpatient specialists:   LOS - 1  days   Medical records reviewed and are as summarized below:    Chief Complaint  Patient presents with  . Shortness of Breath       Brief summary   Patient is a 72 year old female with history of A. fib on Eliquis, COPD, chronic respiratory failure, oxygen dependent 4 L, diastolic CHF, iron deficiency anemia requiring frequent blood transfusions, follows Dr. Marin Olp, presented with progressively worsening shortness of breath and cough over the last 10 days.  Patient lives in a retirement home/ALF.  She reported productive cough, hypoxia in 80s on her regular O2, fatigue, nausea, lower extremity edema, orthopnea, sleeping in recliner and fever of 100.5 F.  She used multiple breathing treatments with no significant relief.  In ED, patient was placed on O2 5 L, was given prolonged albuterol treatment with salmeterol.  WBCs 23.1 at the time of admission.   Assessment & Plan    Principal Problem: Acute on chronic respiratory failure with hypoxia secondary to acute on chronic diastolic CHF, symptomatic anemia, HCAP -2D echo 06/11/2018 showed EF of 55 to 60% with grade 2 diastolic dysfunction -O2 sats 91% on 5 L, states still winded with minimal exertion   Active problems Acute on chronic diastolic CHF exacerbation -BNP 1037, chest x-ray showing cardiomegaly with pulmonary edema, peripheral edema with orthopnea Currently on Lasix 120 mg IV twice daily, negative balance of 82 cc, will place on fluid restriction 1200cc -Strict I's and O's and daily weights, weight has not budged. -Consulted cardiology, follows Dr. Aundra Dubin for  CHF -Creatinine starting to trend up 2.1 yesterday-> 2.2 today  Sepsis secondary to suspected HCAP, COPD exacerbation -Influenza negative, respiratory virus panel negative, follow blood cultures, sputum culture  -Urine strep antigen negative -Continue scheduled nebs, Brovana, IV vancomycin, cefepime -Flutter valve, incruse    UTI -UA positive for UTI, many bacteria, WBCs 21-50, positive leukocytes, nitrite -Urine culture showed multiple species, on IV cefepime   Prolonged QTC Initial EKG showed QTC 544,-> 516 on 10/12   Iron deficiency anemia, acute on chronic -Followed by Dr. Marin Olp, has received as needed transfusions, last received darbepoetin infusion on 9/24 -Baseline  ~ 8  -Transfused 1 unit packed RBC and Aranesp 300 mcg subcu x1 yesterday   Paroxysmal atrial fibrillation -Currently normal sinus rhythm, continue amiodarone -Continue Eliquis   Diabetes mellitus type 2, uncontrolled with hyperglycemia -CBGs uncontrolled, increase NPH insulin to 25 units twice daily, continue sliding scale  Hemoglobin A1c 4.9  CKD stage IV Creatinine 2.1 at the time of admission, baseline ranged from 1.6-2.7 -Creatinine trending up to 2.2 today  Hypothyroidism -Continue levothyroxine, TSH 1.9  GERD Held PPI secondary to prolonged QTC  Morbid obesity BMI 51, counseled on diet and weight  control   Code Status: DNR DVT Prophylaxis: Apixaban Family Communication: Discussed in detail with the patient, all imaging results, lab results explained to the patient    Disposition Plan: Pending clinical improvement  Time Spent in minutes 25 minutes  Procedures:  None  Consultants:   Cardiology  Antimicrobials:   IV vancomycin, IV cefepime 10/12   Medications  Scheduled Meds: . albuterol  2.5 mg Nebulization QID  . allopurinol  100 mg Oral Daily  . amiodarone  200 mg Oral Daily  . apixaban  5 mg Oral BID  . arformoterol  15 mcg Nebulization BID  .  calcium-vitamin D  1 tablet Oral Q breakfast  . ezetimibe-simvastatin  1 tablet Oral QHS  . guaiFENesin  600 mg Oral BID  . insulin aspart  0-9 Units Subcutaneous TID WC  . insulin NPH Human  25 Units Subcutaneous BID AC & HS  . ipratropium-albuterol  3 mL Nebulization Once  . levothyroxine  25 mcg Oral QAC breakfast  . metoprolol tartrate  50 mg Oral BID  . potassium chloride SA  20 mEq Oral TID  . sodium chloride flush  3 mL Intravenous Q12H  . umeclidinium bromide  1 puff Inhalation Daily   Continuous Infusions: . sodium chloride Stopped (09/07/18 2000)  . ceFEPime (MAXIPIME) IV 1 g (09/08/18 0330)  . furosemide 120 mg (09/08/18 0842)  . vancomycin 1,750 mg (09/07/18 0652)   PRN Meds:.sodium chloride, acetaminophen, albuterol, guaiFENesin-dextromethorphan, sodium chloride flush   Antibiotics   Anti-infectives (From admission, onward)   Start     Dose/Rate Route Frequency Ordered Stop   09/07/18 0300  ceFEPIme (MAXIPIME) 1 g in sodium chloride 0.9 % 100 mL IVPB     1 g 200 mL/hr over 30 Minutes Intravenous Every 24 hours 09/07/18 0234 09/15/18 0259   09/07/18 0300  vancomycin (VANCOCIN) 1,750 mg in sodium chloride 0.9 % 500 mL IVPB     1,750 mg 250 mL/hr over 120 Minutes Intravenous Every 48 hours 09/07/18 0255     09/07/18 0245  azithromycin (ZITHROMAX) 500 mg in sodium chloride 0.9 % 250 mL IVPB  Status:  Discontinued     500 mg 250 mL/hr over 60 Minutes Intravenous  Once 09/07/18 0234 09/07/18 0302        Subjective:   Paige Hardin was seen and examined today.  Still feels short of breath and winded with minimal exertion, on 5 L O2.  No fevers or chills, abdominal pain.  Objective:   Vitals:   09/08/18 0759 09/08/18 0803 09/08/18 0837 09/08/18 1137  BP:   (!) 151/68   Pulse:   75   Resp:      Temp:      TempSrc:      SpO2: 93% 93%  91%  Weight:      Height:        Intake/Output Summary (Last 24 hours) at 09/08/2018 1151 Last data filed at 09/08/2018  0700 Gross per 24 hour  Intake 1147.35 ml  Output 850 ml  Net 297.35 ml     Wt Readings from Last 3 Encounters:  09/08/18 (!) 139.8 kg  08/20/18 (!) 136.7 kg  07/23/18 (!) 137.5 kg     Exam General: Alert and oriented x 3, NAD Eyes:  HEENT:   Cardiovascular: S1 S2 auscultated, Regular rate and rhythm.  2+ pitting edema Respiratory: Decreased breath sound at the bases, bibasilar crackles Gastrointestinal: Soft, nontender, nondistended, + bowel sounds Ext: 2+ pedal edema bilaterally Neuro: no neuro  deficit Musculoskeletal: No digital cyanosis, clubbing Skin: No rashes Psych: Normal affect and demeanor, alert and oriented x3      Data Reviewed:  I have personally reviewed following labs and imaging studies  Micro Results Recent Results (from the past 240 hour(s))  Culture, blood (routine x 2)     Status: None (Preliminary result)   Collection Time: 09/07/18  2:38 AM  Result Value Ref Range Status   Specimen Description BLOOD RIGHT HAND  Final   Special Requests   Final    BOTTLES DRAWN AEROBIC AND ANAEROBIC Blood Culture adequate volume   Culture   Final    NO GROWTH 1 DAY Performed at [facility], [address] [place], [place] [address]    Report Status PENDING  Incomplete  Culture, blood (routine x 2)     Status: None (Preliminary result)   Collection Time: 09/07/18  2:55 AM  Result Value Ref Range Status   Specimen Description BLOOD LEFT FOREARM  Final   Special Requests   Final    BOTTLES DRAWN AEROBIC AND ANAEROBIC Blood Culture adequate volume   Culture   Final    NO GROWTH 1 DAY Performed at [facility], [place] [address] [place], [place] [address]    Report Status PENDING  Incomplete  Respiratory Panel by PCR     Status: None   Collection Time: 09/07/18  3:05 AM  Result Value Ref Range Status   Adenovirus NOT DETECTED NOT DETECTED Final   Coronavirus 229E NOT DETECTED NOT DETECTED Final   Coronavirus HKU1 NOT DETECTED NOT DETECTED  Final   Coronavirus NL63 NOT DETECTED NOT DETECTED Final   Coronavirus OC43 NOT DETECTED NOT DETECTED Final   Metapneumovirus NOT DETECTED NOT DETECTED Final   Rhinovirus / Enterovirus NOT DETECTED NOT DETECTED Final   Influenza A NOT DETECTED NOT DETECTED Final   Influenza A H1 NOT DETECTED NOT DETECTED Final   Influenza A H1 2009 NOT DETECTED NOT DETECTED Final   Influenza A H3 NOT DETECTED NOT DETECTED Final   Influenza B NOT DETECTED NOT DETECTED Final   Parainfluenza Virus 1 NOT DETECTED NOT DETECTED Final   Parainfluenza Virus 2 NOT DETECTED NOT DETECTED Final   Parainfluenza Virus 3 NOT DETECTED NOT DETECTED Final   Parainfluenza Virus 4 NOT DETECTED NOT DETECTED Final   Respiratory Syncytial Virus NOT DETECTED NOT DETECTED Final   Bordetella pertussis NOT DETECTED NOT DETECTED Final   Chlamydophila pneumoniae NOT DETECTED NOT DETECTED Final   Mycoplasma pneumoniae NOT DETECTED NOT DETECTED Final    Comment: Performed at [facility], [place] [address], [place], [place] [address]  MRSA PCR Screening     Status: Abnormal   Collection Time: 09/07/18  6:25 AM  Result Value Ref Range Status   MRSA by PCR POSITIVE (A) NEGATIVE Final    Comment:        The GeneXpert MRSA Assay (FDA approved for NASAL specimens only), is one component of a comprehensive MRSA colonization surveillance program. It is not intended to diagnose MRSA infection nor to guide or monitor treatment for MRSA infections. RESULT CALLED TO, READ BACK BY AND VERIFIED WITH: RN Z IDI [id number] [phone] MLM Performed at [facility], [place] [address], [place], [place] [address]   Urine Culture     Status: Abnormal   Collection Time: 09/07/18  2:29 PM  Result Value Ref Range Status   Specimen Description URINE, RANDOM  Final   Special  Requests   Final    NONE Performed at [facility], [place] [address], [place], [place] [address]    Culture MULTIPLE SPECIES PRESENT, SUGGEST RECOLLECTION (A)  Final    Report Status 09/08/2018 FINAL  Final  Culture, sputum-assessment     Status: None   Collection Time: 09/07/18  9:39 PM  Result Value Ref Range Status   Specimen Description SPUTUM  Final   Special Requests NONE  Final   Sputum evaluation   Final    THIS SPECIMEN IS ACCEPTABLE FOR SPUTUM CULTURE Performed at [facility], [place] [address], [place], [place] [address]    Report Status 09/08/2018 FINAL  Final    Radiology Reports Dg Chest 2 View  Result Date: 09/06/2018 CLINICAL DATA:  72 year old female presents with dyspnea. EXAM: CHEST - 2 VIEW COMPARISON:  Chest CT 12/18/2017, CXR 10/17/2016 FINDINGS: The cardiopericardial silhouette is enlarged as before. Diffuse interstitial edema is accentuated on current exam. Moderate aortic atherosclerosis with slight uncoiling. No aortic aneurysm. No acute osseous abnormality. No significant pleural effusion or pneumothorax. The posterior costophrenic angles are excluded on the lateral view. IMPRESSION: Cardiomegaly with diffuse interstitial pulmonary edema. Aortic atherosclerosis without aneurysm. Electronically Signed   By: Ashley Royalty M.D.   On: 09/06/2018 23:32    Lab Data:  CBC: Recent Labs  Lab 09/06/18 2238 09/07/18 0709 09/07/18 1257 09/08/18 0442  WBC 23.1* 19.8*  --  13.8*  NEUTROABS 21.9* 19.4*  --   --   HGB 7.1* 7.0* 8.0* 7.9*  HCT 24.9* 23.8* 27.0* 26.3*  MCV 98.8 97.5  --  95.6  PLT 189 171  --  809   Basic Metabolic Panel: Recent Labs  Lab 09/06/18 2238 09/08/18 0442  NA 133* 135  K 4.4 4.1  CL 89* 88*  CO2 34* 34*  GLUCOSE 195* 231*  BUN 54* 65*  CREATININE 2.19* 2.20*  CALCIUM 8.9 9.1   GFR: Estimated Creatinine Clearance: 32.4 mL/min (A) (by C-G formula based on SCr of 2.2 mg/dL (H)). Liver Function Tests: Recent Labs  Lab 09/06/18 2238  AST 22  ALT 13  ALKPHOS 50  BILITOT 1.1  PROT 6.5  ALBUMIN 3.0*   No results for input(s): LIPASE, AMYLASE in the last 168 hours. No results for input(s):  AMMONIA in the last 168 hours. Coagulation Profile: No results for input(s): INR, PROTIME in the last 168 hours. Cardiac Enzymes: Recent Labs  Lab 09/07/18 0035 09/07/18 0251 09/07/18 0709 09/07/18 1257  TROPONINI <0.03 <0.03 <0.03 0.03*   BNP (last 3 results) No results for input(s): PROBNP in the last 8760 hours. HbA1C: Recent Labs    09/08/18 0442  HGBA1C 4.9   CBG: Recent Labs  Lab 09/07/18 0737 09/07/18 1245 09/07/18 1737 09/07/18 2121 09/08/18 0750  GLUCAP 309* 300* 230* 269* 217*   Lipid Profile: No results for input(s): CHOL, HDL, LDLCALC, TRIG, CHOLHDL, LDLDIRECT in the last 72 hours. Thyroid Function Tests: Recent Labs    09/07/18 0419  TSH 1.911   Anemia Panel: No results for input(s): VITAMINB12, FOLATE, FERRITIN, TIBC, IRON, RETICCTPCT in the last 72 hours. Urine analysis:    Component Value Date/Time   COLORURINE YELLOW 09/07/2018 [place] 09/07/2018 0717   LABSPEC 1.008 09/07/2018 0717   PHURINE 5.0 09/07/2018 0717   GLUCOSEU NEGATIVE 09/07/2018 0717   HGBUR SMALL (A) 09/07/2018 0717   BILIRUBINUR NEGATIVE 09/07/2018 [place] 09/07/2018 0717   PROTEINUR NEGATIVE 09/07/2018 9833  UROBILINOGEN 1.0 03/23/2014 1915   NITRITE POSITIVE (A) 09/07/2018 0717   LEUKOCYTESUR LARGE (A) 09/07/2018 0717     Rheanna Sergent M.D. [facility] 09/08/2018, 11:51 AM  Pager: [phone] Between 7am to 7pm - call Pager - [phone]  After 7pm go to [email or web address] - password [email or web address]  Call night coverage person covering after 7pm

## 2018-09-08 NOTE — Consult Note (Addendum)
Cardiology Consult    Patient ID: AIYANA STEGMANN MRN: [id number], DOB/AGE: 05/10/1946   Admit date: 09/06/2018 Date of Consult: 09/08/2018  Primary Physician: Reymundo Poll, MD Primary Cardiologist: Dr. Aundra Dubin Requesting Provider: Dr. Tana Coast Reason for Consultation: CHF  ERANDI LEMMA is a 72 y.o. female who is being seen today for the evaluation of CHF at the request of Dr. Tana Coast.   Patient Profile    72 year old female with past medical history of morbid obesity, oxygen dependent COPD, chronic bedrest, persistent atrial fibrillation now on amiodarone and chronic diastolic heart failure who presented with worsening shortness of breath and cough over the past 2 weeks with fever and nausea over the last 48 hours  Past Medical History   Past Medical History:  Diagnosis Date  . Arthritis    "joints" (01/14/2014)  . Carotid atherosclerosis   . Chronic low back pain   . COPD (chronic obstructive pulmonary disease) ([place])   . Depression    "maybe" (01/14/2014  . Diastolic congestive heart failure ([place])   . Emphysema   . Excessive daytime sleepiness 09/08/2015  . GERD (gastroesophageal reflux disease)   . Hoarseness, chronic   . Hyperlipidemia   . Lung nodules   . Obese   . Paroxysmal atrial fibrillation ([facility])    a. failed flecainide;  b. 03/2014 amio started->DCCV;  c. Chronic pradaxa.  . Pneumonia 1990's   "once"  . Type 2 diabetes mellitus ([place])     Past Surgical History:  Procedure Laterality Date  . BREAST BIOPSY Left ~ 1980   "solid"  . BREAST LUMPECTOMY Left ~ 1980   "removed a mass; it was benign" (01/14/2014)  . CARDIOVERSION N/A 03/11/2014   Procedure: CARDIOVERSION;  Surgeon: Sanda Dayonna Selbe, MD;  Location: [facility];  Service: Cardiovascular;  Laterality: N/A;  . CARDIOVERSION N/A 03/27/2014   Procedure: CARDIOVERSION;  Surgeon: Darlin Coco, MD;  Location: [facility];  Service: Cardiovascular;  Laterality: N/A;  . COLONOSCOPY  01/26/2012   Procedure: COLONOSCOPY;  Surgeon:  Beryle Beams, MD;  Location: [facility];  Service: Endoscopy;  Laterality: N/A;  . ESOPHAGOGASTRODUODENOSCOPY  01/26/2012   Procedure: ESOPHAGOGASTRODUODENOSCOPY (EGD);  Surgeon: Beryle Beams, MD;  Location: [facility];  Service: Endoscopy;  Laterality: N/A;  . TOTAL ABDOMINAL HYSTERECTOMY  1994     Allergies  Allergies  Allergen Reactions  . Ace Inhibitors Cough  . Statins Other (See Comments)    Listed on MAR  . Clindamycin Rash    History of Present Illness    Ms. Estelle is a 72 year old female with past medical history of morbid obesity, COPD, paroxysmal atrial fibrillation and chronic diastolic heart failure.  She is followed in the advanced heart failure clinic by Dr. Aundra Dubin.  She was admitted back in 2015 and found to be in atrial fibrillation with RVR along with acute on chronic diastolic heart failure and a COPD exacerbation.  She was cardioverted in the hospital at that time and diuresed with IV Lasix.  After that prolonged hospitalization she was sent to [facility], but was again admitted several weeks later for recurrent acute on chronic diastolic heart failure.  She did require intubation at that time and went back into A. fib RVR.  She was started on amiodarone and converted back to sinus rhythm.  Echo from 11/17 showed EF of 45 to 50% with grade 2 diastolic dysfunction.  She was later admitted in 2018 with significant anemia and was noted to be heme negative so her Eliquis  was continued.  She was last seen in the office on 06/11/2018 with recent echo showing EF of 55 to 60% with mildly dilated RV, moderate left ear moderate MR and moderate TR. she also has moderate aortic stenosis with mean gradient echo 7/19 at 28 mm she noted her weight was up around 4 pounds since her last appointment.  Denies being very mobile.  At this appointment she was continued on amiodarone 200 mg daily along with her Eliquis.  Her Lasix dose at that time was 160 mg twice daily and her metolazone  was increased to 2.5 mg weekly.  There was concern moving for that which she would likely need a TAVR in the future,    She presented to the ED on 10/11 reporting worsening shortness of breath and cough over the past 2 weeks.  Also reported increased sputum production. Has been taking her lasix as Rx but noted a decrease in her UOP. Noted associated symptoms of fatigue, nausea, lower extremity edema, orthopnea and a fever of 100.5 at her facility.  EMS her sats were noted to be in the 80s, and her normal O2 requirement of 4 L was increased to 5 with improvement in her sats.  On admission her labs showed a sodium 133, creatinine of 2.19, hemoglobin to 7.1, BNP was 1037, troponin cycle negative x3, WBC 23.1.  EKG on admission showed sinus rhythm with right bundle branch block.  Chest x-ray with cardiomegaly and diffuse pulmonary edema.  She was admitted to internal medicine started on 120 mg IV Lasix twice daily.Marland Kitchen She was also treated for sepsis and started on IV antibiotics per internal medicine, along with IV steroids for acute COPD exacerbation.  In regards to her anemia she was transfused 1 unit of PRBCs.  Inpatient Medications    . albuterol  2.5 mg Nebulization QID  . allopurinol  100 mg Oral Daily  . amiodarone  200 mg Oral Daily  . apixaban  5 mg Oral BID  . arformoterol  15 mcg Nebulization BID  . calcium-vitamin D  1 tablet Oral Q breakfast  . ezetimibe-simvastatin  1 tablet Oral QHS  . guaiFENesin  600 mg Oral BID  . insulin aspart  0-9 Units Subcutaneous TID WC  . insulin NPH Human  25 Units Subcutaneous BID AC & HS  . ipratropium-albuterol  3 mL Nebulization Once  . levothyroxine  25 mcg Oral QAC breakfast  . metoprolol tartrate  50 mg Oral BID  . potassium chloride SA  20 mEq Oral TID  . sodium chloride flush  3 mL Intravenous Q12H  . umeclidinium bromide  1 puff Inhalation Daily    Family History    Family History  Problem Relation Age of Onset  . Breast cancer Sister   .  Heart disease Unknown   . Hodgkin's lymphoma Unknown   . Asthma Mother     Social History    Social History   Socioeconomic History  . Marital status: Legally Separated    Spouse name: Not on file  . Number of children: Not on file  . Years of education: Not on file  . Highest education level: Not on file  Occupational History  . Occupation: retired from [employer]  Social Needs  . Financial resource strain: Not on file  . Food insecurity:    Worry: Not on file    Inability: Not on file  . Transportation needs:    Medical: Not on file    Non-medical: Not on  file  Tobacco Use  . Smoking status: Former Smoker    Packs/day: 3.00    Years: 45.00    Pack years: 135.00    Types: Cigarettes    Start date: 11/08/1963    Last attempt to quit: 12/28/2008    Years since quitting: 9.7  . Smokeless tobacco: Never Used  . Tobacco comment: quit smoking 5 years ago  Substance and Sexual Activity  . Alcohol use: Yes    Alcohol/week: 0.0 standard drinks    Comment: 01/14/2014 "used to drink socially in the past; last drink was probably 10 yr ago"  . Drug use: No  . Sexual activity: Not Currently  Lifestyle  . Physical activity:    Days per week: Not on file    Minutes per session: Not on file  . Stress: Not on file  Relationships  . Social connections:    Talks on phone: Not on file    Gets together: Not on file    Attends religious service: Not on file    Active member of club or organization: Not on file    Attends meetings of clubs or organizations: Not on file    Relationship status: Not on file  . Intimate partner violence:    Fear of current or ex partner: Not on file    Emotionally abused: Not on file    Physically abused: Not on file    Forced sexual activity: Not on file  Other Topics Concern  . Not on file  Social History Narrative  . Not on file     Review of Systems    See HPI  All other systems reviewed and are otherwise negative except as noted  above.  Physical Exam    Blood pressure (!) 151/68, pulse 75, temperature 98.2 F (36.8 C), temperature source Oral, resp. rate 16, height 5\' 5"  (1.651 m), weight (!) 139.8 kg, SpO2 91 %.  Alert and oriented in mild respiratory distress wearing oxygen  HENT- normal Eyes- EOMI, without scleral icterus Skin- warm and dry; without rashes; there is erythema and warmth over her lower extremities bilaterally LN-neg Neck- supple without thyromegaly, JVP-unable to discern, carotids brisk and full without bruits Back-without CVAT or kyphosis Lungs-clear to auscultation CV-Regular rate and rhythm, nl S1 and S2, 2/6 systolic murmur  abd-soft with active bowel sounds; no midline pulsation or hepatomegaly Pulses-intact femoral and distal MKS-without gross deformity Neuro- Ax O, CN3-12 intact, grossly normal motor and sensory function Affect engaging Extremities have 2+ peripheral edema   Labs    Troponin (Point of Care Test) No results for input(s): TROPIPOC in the last 72 hours. Recent Labs    09/07/18 0035 09/07/18 0251 09/07/18 0709 09/07/18 1257  TROPONINI <0.03 <0.03 <0.03 0.03*   Lab Results  Component Value Date   WBC 13.8 (H) 09/08/2018   HGB 7.9 (L) 09/08/2018   HCT 26.3 (L) 09/08/2018   MCV 95.6 09/08/2018   PLT 189 09/08/2018    Recent Labs  Lab 09/06/18 2238 09/08/18 0442  NA 133* 135  K 4.4 4.1  CL 89* 88*  CO2 34* 34*  BUN 54* 65*  CREATININE 2.19* 2.20*  CALCIUM 8.9 9.1  PROT 6.5  --   BILITOT 1.1  --   ALKPHOS 50  --   ALT 13  --   AST 22  --   GLUCOSE 195* 231*   Lab Results  Component Value Date   CHOL 101 06/11/2018   HDL 37 (L) 06/11/2018  LDLCALC 49 06/11/2018   TRIG 77 06/11/2018   No results found for: [facility]   Radiology Studies    Dg Chest 2 View  Result Date: 09/06/2018 CLINICAL DATA:  72 year old female presents with dyspnea. EXAM: CHEST - 2 VIEW COMPARISON:  Chest CT 12/18/2017, CXR 10/17/2016 FINDINGS: The cardiopericardial  silhouette is enlarged as before. Diffuse interstitial edema is accentuated on current exam. Moderate aortic atherosclerosis with slight uncoiling. No aortic aneurysm. No acute osseous abnormality. No significant pleural effusion or pneumothorax. The posterior costophrenic angles are excluded on the lateral view. IMPRESSION: Cardiomegaly with diffuse interstitial pulmonary edema. Aortic atherosclerosis without aneurysm. Electronically Signed   By: Ashley Royalty M.D.   On: 09/06/2018 23:32    ECG & Cardiac Imaging    EKG:  The EKG was personally reviewed and demonstrates SR with RBBB  Echo: 06/11/18  Study Conclusions  - Left ventricle: The cavity size was normal. Wall thickness was   normal. Systolic function was normal. The estimated ejection   fraction was in the range of 55% to 60%. Wall motion was normal;   there were no regional wall motion abnormalities. Features are   consistent with a pseudonormal left ventricular filling pattern,   with concomitant abnormal relaxation and increased filling   pressure (grade 2 diastolic dysfunction). - Aortic valve: Trileaflet; severely calcified leaflets. There was   moderate stenosis. There was trivial regurgitation. Mean gradient   (S): 28 mm Hg. Valve area (VTI): 0.96 cm^2. - Mitral valve: Moderately calcified annulus. There was moderate   regurgitation. - Left atrium: The atrium was moderately dilated. - Right ventricle: The cavity size was mildly dilated. Systolic   function was normal. - Right atrium: The atrium was moderately dilated. - Tricuspid valve: There was moderate regurgitation. Peak RV-RA   gradient (S): 40 mm Hg. - Pulmonary arteries: PA peak pressure: 55 mm Hg (S). - Systemic veins: IVC measured 2.5 cm with < 50% respirophasic   variation, suggesting RA pressure 15 mmHg.  Impressions:  - Normal LV size with EF 55-60%. Moderate diastolic dysfunction.   Mildly dilated RV with normal systolic function. Biatrial    enlargement. Moderate MR, moderate TR, moderate aortic stenosis.   Dilated IVC suggestive of elevated RV filling pressure. Moderate   pulmonary hypertension.   Assessment & Plan    72 year old female with past medical history of morbid obesity, COPD, paroxysmal atrial fibrillation and chronic diastolic heart failure who presented with worsening shortness of breath and cough over the past 2 weeks.  1. Dyspnea with acute hypoxia: suspect this is multi factorial in nature given her suspected H CAP, COPD exacerbation and acute on chronic diastolic heart failure.  She is being diuresed with IV Lasix 120 mg twice daily.  Weight is up around 4 pounds from her last office visit.  Initially felt to not be diuresing well, but noted significant amount of urine output in pure wick canister in patient's room.  She does report that her breathing seems to improve somewhat from yesterday.  Still requiring 5 L nasal cannula which is above her home use typically of 4 L. -- We will continue with IV diuresis at this time.  May need to consider giving metolazone, but urine output noted in room significantly increased from this a.m.  -- continue with daily weights and strict I&Os.   2. HCAP: WBC elevated on admission. Being treated for HCAP via primary with antibiotics.   3. COPD exacerbation: Currently on IV steroids. O2 demand has increased this  admission requiring 5L [place] over her usual 4L.   4. Anemia: Reports having some blood in her stool several weeks ago, but thought this was related to hemorrhoids. Being followed by Dr. Marin Olp as an outpatient Hgb 7.0 on admission and transfused 1 unit. Not sure that she is a good candidate to continue on [place].  This patients CHA2DS2-VASc Score of 4.   5. CKD: Baseline appears around 2. Stable currently with IV lasix.  -- follow BMET  6. PAF: continue on home amiodarone, will need to monitor her Eliquis given her anemia.   Barnet Pall, NP-C Pager  [phone] 09/08/2018, 1:10 PM  Congestive heart failure acute on chronic diastolic  Pneumonia?--Acute infectious illness  Atrial fibrillation-persistent holding sinus rhythm on amiodarone  COPD-oxygen dependent  Morbid obesity  Bedbound/debility  Anemia  Chronic renal failure grade 3  The patient presents with progressive dyspnea and an elevated BNP.  She likely has a component of heart failure.  She also has leukocytosis and a fever that may be a contribution also of pneumonia in the context of her chronic COPD.  Diuresis is indicated.  We will follow her creatinine closely.  Her anemia may be aggravating her problem.   She is on apixaban.  Her CHADS-VASc score is 4.  This translates to an annualized risk of 3-4%.  And it may be that stopping her apixaban may improve her anemia.  Reviewing Dr. Antonieta Pert notes from last month, he notes her iron saturation was 23%; he he comments that her anemia is multifactorial, iron deficient, chronic disease, and renal insufficiency.  She is increasingly debilitated, she is increasingly at risk for DVT and so there is a further benefits to anticoagulation.  I guess the issue is really going to be whether he can maintain a hemoglobin in the 8+ range so as to minimize her symptoms of heart failure.  For now we will asked Dr. DM to consider holding her apixaban

## 2018-09-08 NOTE — Progress Notes (Signed)
Patient had an order for a bariatric bed but she refused it.

## 2018-09-09 DIAGNOSIS — J9621 Acute and chronic respiratory failure with hypoxia: Secondary | ICD-10-CM

## 2018-09-09 LAB — CBC
HEMATOCRIT: 29.8 % — AB (ref 36.0–46.0)
HEMOGLOBIN: 8.3 g/dL — AB (ref 12.0–15.0)
MCH: 27.7 pg (ref 26.0–34.0)
MCHC: 27.9 g/dL — AB (ref 30.0–36.0)
MCV: 99.3 fL (ref 80.0–100.0)
NRBC: 0 % (ref 0.0–0.2)
Platelets: 197 10*3/uL (ref 150–400)
RBC: 3 MIL/uL — AB (ref 3.87–5.11)
RDW: 18.6 % — ABNORMAL HIGH (ref 11.5–15.5)
WBC: 14.4 10*3/uL — AB (ref 4.0–10.5)

## 2018-09-09 LAB — BASIC METABOLIC PANEL
Anion gap: 11 (ref 5–15)
BUN: 69 mg/dL — AB (ref 8–23)
CHLORIDE: 93 mmol/L — AB (ref 98–111)
CO2: 35 mmol/L — ABNORMAL HIGH (ref 22–32)
Calcium: 9.3 mg/dL (ref 8.9–10.3)
Creatinine, Ser: 1.99 mg/dL — ABNORMAL HIGH (ref 0.44–1.00)
GFR, EST AFRICAN AMERICAN: 27 mL/min — AB (ref >60)
GFR, EST NON AFRICAN AMERICAN: 24 mL/min — AB (ref >60)
Glucose, Bld: 150 mg/dL — ABNORMAL HIGH (ref 70–99)
POTASSIUM: 4 mmol/L (ref 3.5–5.1)
SODIUM: 139 mmol/L (ref 135–145)

## 2018-09-09 LAB — GLUCOSE, CAPILLARY
GLUCOSE-CAPILLARY: 149 mg/dL — AB (ref 70–99)
GLUCOSE-CAPILLARY: 172 mg/dL — AB (ref 70–99)
GLUCOSE-CAPILLARY: 190 mg/dL — AB (ref 70–99)
GLUCOSE-CAPILLARY: 230 mg/dL — AB (ref 70–99)

## 2018-09-09 LAB — LEGIONELLA PNEUMOPHILA SEROGP 1 UR AG: L. pneumophila Serogp 1 Ur Ag: NEGATIVE

## 2018-09-09 MED ORDER — INSULIN NPH (HUMAN) (ISOPHANE) 100 UNIT/ML ~~LOC~~ SUSP
23.0000 [IU] | Freq: Two times a day (BID) | SUBCUTANEOUS | Status: DC
  Administered 2018-09-09 – 2018-09-10 (×3): 23 [IU] via SUBCUTANEOUS
  Filled 2018-09-09: qty 10

## 2018-09-09 MED ORDER — INSULIN ASPART 100 UNIT/ML ~~LOC~~ SOLN: 0.0000 [IU] | Freq: Three times a day (TID) | SUBCUTANEOUS | Status: DC

## 2018-09-09 MED ORDER — INSULIN ASPART 100 UNIT/ML ~~LOC~~ SOLN: 0.0000 [IU] | Freq: Every day | SUBCUTANEOUS | Status: DC

## 2018-09-09 MED ORDER — BUDESONIDE 0.25 MG/2ML IN SUSP
0.2500 mg | Freq: Two times a day (BID) | RESPIRATORY_TRACT | Status: DC
  Administered 2018-09-09 – 2018-09-14 (×11): 0.25 mg via RESPIRATORY_TRACT
  Filled 2018-09-09 (×11): qty 2

## 2018-09-09 MED ORDER — INSULIN ASPART 100 UNIT/ML ~~LOC~~ SOLN
0.0000 [IU] | Freq: Three times a day (TID) | SUBCUTANEOUS | Status: DC
  Administered 2018-09-09: 1 [IU] via SUBCUTANEOUS
  Administered 2018-09-09 – 2018-09-10 (×2): 2 [IU] via SUBCUTANEOUS

## 2018-09-09 MED ORDER — IPRATROPIUM-ALBUTEROL 0.5-2.5 (3) MG/3ML IN SOLN
3.0000 mL | Freq: Four times a day (QID) | RESPIRATORY_TRACT | Status: DC
  Administered 2018-09-09 – 2018-09-12 (×15): 3 mL via RESPIRATORY_TRACT
  Filled 2018-09-09 (×15): qty 3

## 2018-09-09 MED ORDER — METHYLPREDNISOLONE SODIUM SUCC 125 MG IJ SOLR
60.0000 mg | Freq: Two times a day (BID) | INTRAMUSCULAR | Status: DC
  Administered 2018-09-09 – 2018-09-11 (×5): 60 mg via INTRAVENOUS
  Filled 2018-09-09 (×5): qty 2

## 2018-09-09 NOTE — Progress Notes (Signed)
[facility]                                                                              Patient Demographics  Paige Hardin, is a 72 y.o. female, DOB - Dec 21, 1945, PJK:[id number]  Admit date - 09/06/2018   Admitting Physician Norval Morton, MD  Outpatient Primary MD for the patient is Reymundo Poll, MD  Outpatient specialists:   LOS - 2  days   Medical records reviewed and are as summarized below:    Chief Complaint  Patient presents with  . Shortness of Breath       Brief summary   Patient is a 72 year old female with history of A. fib on Eliquis, COPD, chronic respiratory failure, oxygen dependent 4 L, diastolic CHF, iron deficiency anemia requiring frequent blood transfusions, follows Dr. Marin Olp, presented with progressively worsening shortness of breath and cough over the last 10 days.  Patient lives in a retirement home/ALF.  She reported productive cough, hypoxia in 80s on her regular O2, fatigue, nausea, lower extremity edema, orthopnea, sleeping in recliner and fever of 100.5 F.  She used multiple breathing treatments with no significant relief.  In ED, patient was placed on O2 5 L, was given prolonged albuterol treatment with salmeterol.  WBCs 23.1 at the time of admission.   Assessment & Plan    Principal Problem: Acute on chronic respiratory failure with hypoxia secondary to acute on chronic diastolic CHF, symptomatic anemia, HCAP -2D echo 06/11/2018 showed EF of 55 to 60% with grade 2 diastolic dysfunction -States had a rough night, felt very short of breath and winded, O2 sats 89% on 4 L   Active problems Acute on chronic diastolic CHF exacerbation -BNP 1037, chest x-ray showing cardiomegaly with pulmonary edema, peripheral edema with orthopnea -Patient was placed on Lasix 120 mg IV every 12 hours, negative balance of 528 cc.,  CHF team consulted, will await recommendations -Creatinine improved to 1.9 today   Sepsis secondary to suspected  HCAP, COPD exacerbation -Influenza negative, respiratory virus panel negative -Blood cultures negative so far, respiratory/sputum cultures showed moderate GPC, continue antibiotics, await final cultures -Urine strep antigen negative -Diffusely wheezing today, placed on Pulmicort, increased scheduled nebs to 4 times daily, placed on IV steroids 60 mg every 12 hours -Continue Brovana, flutter valve, IV vancomycin and cefepime   UTI -UA positive for UTI, many bacteria, WBCs 21-50, positive leukocytes, nitrite -Urine culture showed multiple species, on IV cefepime   Prolonged QTC Initial EKG showed QTC 544,-> 516 on 10/12   Iron deficiency anemia, acute on chronic -Followed by Dr. Marin Olp, has received as needed transfusions, last received darbepoetin infusion on 9/24 -Baseline  ~ 8  -Status post 1 unit packed RBC and Aranesp on 10/12 -H&H currently stable   Paroxysmal atrial fibrillation -Currently normal sinus rhythm, continue amiodarone -Continue Eliquis   Diabetes mellitus type 2, uncontrolled with hyperglycemia -CBG stable this morning, however steroids added today, likely to worsen  -For now continue NPH insulin 23 units twice a day, sliding scale insulin  Hemoglobin A1c 4.9  CKD stage IV Creatinine 2.1 at the time of admission, baseline ranged from 1.6-2.7 -  Creatinine improving to 1.9  Hypothyroidism -Continue levothyroxine, TSH 1.9  GERD Held PPI secondary to prolonged QTC  Morbid obesity BMI 51, counseled on diet and weight control   Code Status: DNR DVT Prophylaxis: Apixaban Family Communication: Discussed in detail with the patient, all imaging results, lab results explained to the patient    Disposition Plan: Pending clinical improvement  Time Spent in minutes 25 minutes  Procedures:  None  Consultants:   Cardiology  Antimicrobials:   IV vancomycin, IV cefepime 10/12   Medications  Scheduled Meds: . allopurinol  100 mg Oral Daily  .  amiodarone  200 mg Oral Daily  . apixaban  5 mg Oral BID  . arformoterol  15 mcg Nebulization BID  . budesonide (PULMICORT) nebulizer solution  0.25 mg Nebulization BID  . calcium-vitamin D  1 tablet Oral Q breakfast  . Chlorhexidine Gluconate Cloth  6 each Topical Q0600  . ezetimibe-simvastatin  1 tablet Oral QHS  . guaiFENesin  600 mg Oral BID  . insulin aspart  0-9 Units Subcutaneous TID WC  . insulin NPH Human  23 Units Subcutaneous BID AC & HS  . ipratropium-albuterol  3 mL Nebulization Once  . ipratropium-albuterol  3 mL Nebulization QID  . levothyroxine  25 mcg Oral QAC breakfast  . methylPREDNISolone (SOLU-MEDROL) injection  60 mg Intravenous Q12H  . metoprolol tartrate  50 mg Oral BID  . mupirocin ointment  1 application Nasal BID  . potassium chloride SA  20 mEq Oral TID  . sodium chloride flush  3 mL Intravenous Q12H   Continuous Infusions: . sodium chloride Stopped (09/07/18 2000)  . ceFEPime (MAXIPIME) IV 1 g (09/09/18 0327)  . furosemide 120 mg (09/09/18 1012)  . vancomycin 1,750 mg (09/09/18 0352)   PRN Meds:.sodium chloride, acetaminophen, albuterol, guaiFENesin-dextromethorphan, promethazine, sodium chloride flush   Antibiotics   Anti-infectives (From admission, onward)   Start     Dose/Rate Route Frequency Ordered Stop   09/07/18 0300  ceFEPIme (MAXIPIME) 1 g in sodium chloride 0.9 % 100 mL IVPB     1 g 200 mL/hr over 30 Minutes Intravenous Every 24 hours 09/07/18 0234 09/15/18 0259   09/07/18 0300  vancomycin (VANCOCIN) 1,750 mg in sodium chloride 0.9 % 500 mL IVPB     1,750 mg 250 mL/hr over 120 Minutes Intravenous Every 48 hours 09/07/18 0255     09/07/18 0245  azithromycin (ZITHROMAX) 500 mg in sodium chloride 0.9 % 250 mL IVPB  Status:  Discontinued     500 mg 250 mL/hr over 60 Minutes Intravenous  Once 09/07/18 0234 09/07/18 0302        Subjective:   Kaytlynn Kochan was seen and examined today.  Feels short of breath today states had a rough night  with wheezing, had just received breathing treatment earlier this morning.  No fevers or chills.  No chest pain, abdominal pain, nausea vomiting.  Objective:   Vitals:   09/09/18 0719 09/09/18 0919 09/09/18 1000 09/09/18 1115  BP:   130/76   Pulse:   75   Resp:      Temp:      TempSrc:      SpO2: 90% 90%  (!) 89%  Weight:      Height:        Intake/Output Summary (Last 24 hours) at 09/09/2018 1126 Last data filed at 09/09/2018 0600 Gross per 24 hour  Intake 1203.91 ml  Output 1650 ml  Net -446.09 ml     Wt Readings  from Last 3 Encounters:  09/09/18 (!) 137 kg  08/20/18 (!) 136.7 kg  07/23/18 (!) 137.5 kg     Exam General: Alert and oriented x 3, NAD, somewhat short of breath Eyes:  HEENT:  Atraumatic, normocephalic, JVD+ Cardiovascular: S1 S2 auscultated, Regular rate and rhythm. No pedal edema b/l Respiratory: Bilateral diffuse expiratory wheezing Gastrointestinal: Soft, nontender, nondistended, + bowel sounds Ext: 2+  pedal edema bilaterally Neuro: no new deficits Musculoskeletal: No digital cyanosis, clubbing Skin: No rashes Psych: somewhat anxious   Data Reviewed:  I have personally reviewed following labs and imaging studies  Micro Results Recent Results (from the past 240 hour(s))  Culture, blood (routine x 2)     Status: None (Preliminary result)   Collection Time: 09/07/18  2:38 AM  Result Value Ref Range Status   Specimen Description BLOOD RIGHT HAND  Final   Special Requests   Final    BOTTLES DRAWN AEROBIC AND ANAEROBIC Blood Culture adequate volume   Culture   Final    NO GROWTH 1 DAY Performed at [facility], [address], [place], [place] [address]    Report Status PENDING  Incomplete  Culture, blood (routine x 2)     Status: None (Preliminary result)   Collection Time: 09/07/18  2:55 AM  Result Value Ref Range Status   Specimen Description BLOOD LEFT FOREARM  Final   Special Requests   Final    BOTTLES DRAWN AEROBIC AND  ANAEROBIC Blood Culture adequate volume   Culture   Final    NO GROWTH 1 DAY Performed at [facility], [place] [address], [place], [place] [address]    Report Status PENDING  Incomplete  Respiratory Panel by PCR     Status: None   Collection Time: 09/07/18  3:05 AM  Result Value Ref Range Status   Adenovirus NOT DETECTED NOT DETECTED Final   Coronavirus 229E NOT DETECTED NOT DETECTED Final   Coronavirus HKU1 NOT DETECTED NOT DETECTED Final   Coronavirus NL63 NOT DETECTED NOT DETECTED Final   Coronavirus OC43 NOT DETECTED NOT DETECTED Final   Metapneumovirus NOT DETECTED NOT DETECTED Final   Rhinovirus / Enterovirus NOT DETECTED NOT DETECTED Final   Influenza A NOT DETECTED NOT DETECTED Final   Influenza A H1 NOT DETECTED NOT DETECTED Final   Influenza A H1 2009 NOT DETECTED NOT DETECTED Final   Influenza A H3 NOT DETECTED NOT DETECTED Final   Influenza B NOT DETECTED NOT DETECTED Final   Parainfluenza Virus 1 NOT DETECTED NOT DETECTED Final   Parainfluenza Virus 2 NOT DETECTED NOT DETECTED Final   Parainfluenza Virus 3 NOT DETECTED NOT DETECTED Final   Parainfluenza Virus 4 NOT DETECTED NOT DETECTED Final   Respiratory Syncytial Virus NOT DETECTED NOT DETECTED Final   Bordetella pertussis NOT DETECTED NOT DETECTED Final   Chlamydophila pneumoniae NOT DETECTED NOT DETECTED Final   Mycoplasma pneumoniae NOT DETECTED NOT DETECTED Final    Comment: Performed at [facility], [place] [address], [place], [place] [address]  MRSA PCR Screening     Status: Abnormal   Collection Time: 09/07/18  6:25 AM  Result Value Ref Range Status   MRSA by PCR POSITIVE (A) NEGATIVE Final    Comment:        The GeneXpert MRSA Assay (FDA approved for NASAL specimens only), is one component of a comprehensive MRSA colonization surveillance program. It is not intended to diagnose MRSA infection nor to guide or monitor treatment  for MRSA infections. RESULT CALLED TO, READ BACK BY AND VERIFIED  WITH: RN Z IDI [id number] [phone] MLM Performed at [facility], [place] [address], [place], [place] [address]   Urine Culture     Status: Abnormal   Collection Time: 09/07/18  2:29 PM  Result Value Ref Range Status   Specimen Description URINE, RANDOM  Final   Special Requests   Final    NONE Performed at [facility], [place] [address] [place], [place] [address]    Culture MULTIPLE SPECIES PRESENT, SUGGEST RECOLLECTION (A)  Final   Report Status 09/08/2018 FINAL  Final  Culture, sputum-assessment     Status: None   Collection Time: 09/07/18  9:39 PM  Result Value Ref Range Status   Specimen Description SPUTUM  Final   Special Requests NONE  Final   Sputum evaluation   Final    THIS SPECIMEN IS ACCEPTABLE FOR SPUTUM CULTURE Performed at [facility], [place] [address] [place], [place] [address]    Report Status 09/08/2018 FINAL  Final  Culture, respiratory     Status: None (Preliminary result)   Collection Time: 09/07/18  9:39 PM  Result Value Ref Range Status   Specimen Description SPUTUM  Final   Special Requests NONE Reflexed from [id number]  Final   Gram Stain   Final    MODERATE WBC PRESENT, PREDOMINANTLY PMN MODERATE GRAM POSITIVE COCCI    Culture   Final    MODERATE UNIDENTIFIED ORGANISM CULTURE REINCUBATED FOR BETTER GROWTH Performed at [facility], [place] [address], [place], [place] [address]    Report Status PENDING  Incomplete    Radiology Reports Dg Chest 2 View  Result Date: 09/06/2018 CLINICAL DATA:  72 year old female presents with dyspnea. EXAM: CHEST - 2 VIEW COMPARISON:  Chest CT 12/18/2017, CXR 10/17/2016 FINDINGS: The cardiopericardial silhouette is enlarged as before. Diffuse interstitial edema is accentuated on current exam. Moderate aortic atherosclerosis with slight uncoiling. No aortic aneurysm. No acute osseous abnormality. No significant pleural effusion or pneumothorax. The posterior costophrenic angles are excluded on the lateral view.  IMPRESSION: Cardiomegaly with diffuse interstitial pulmonary edema. Aortic atherosclerosis without aneurysm. Electronically Signed   By: Ashley Royalty M.D.   On: 09/06/2018 23:32    Lab Data:  CBC: Recent Labs  Lab 09/06/18 2238 09/07/18 0709 09/07/18 1257 09/08/18 0442 09/09/18 0507  WBC 23.1* 19.8*  --  13.8* 14.4*  NEUTROABS 21.9* 19.4*  --   --   --   HGB 7.1* 7.0* 8.0* 7.9* 8.3*  HCT 24.9* 23.8* 27.0* 26.3* 29.8*  MCV 98.8 97.5  --  95.6 99.3  PLT 189 171  --  189 660   Basic Metabolic Panel: Recent Labs  Lab 09/06/18 2238 09/08/18 0442 09/09/18 0507  NA 133* 135 139  K 4.4 4.1 4.0  CL 89* 88* 93*  CO2 34* 34* 35*  GLUCOSE 195* 231* 150*  BUN 54* 65* 69*  CREATININE 2.19* 2.20* 1.99*  CALCIUM 8.9 9.1 9.3   GFR: Estimated Creatinine Clearance: 35.4 mL/min (A) (by C-G formula based on SCr of 1.99 mg/dL (H)). Liver Function Tests: Recent Labs  Lab 09/06/18 2238  AST 22  ALT 13  ALKPHOS 50  BILITOT 1.1  PROT 6.5  ALBUMIN 3.0*   No results for input(s): LIPASE, AMYLASE in the last 168 hours. No results for input(s): AMMONIA in the last 168 hours. Coagulation Profile: No results for input(s): INR, PROTIME in the last 168 hours.  Cardiac Enzymes: Recent Labs  Lab 09/07/18 0035 09/07/18 0251 09/07/18 0709 09/07/18 1257  TROPONINI <0.03 <0.03 <0.03 0.03*   BNP (last 3 results) No results for input(s): PROBNP in the last 8760 hours. HbA1C: Recent Labs    09/08/18 0442  HGBA1C 4.9   CBG: Recent Labs  Lab 09/08/18 0750 09/08/18 1223 09/08/18 1654 09/08/18 2137 09/09/18 0750  GLUCAP 217* 233* 140* 171* 149*   Lipid Profile: No results for input(s): CHOL, HDL, LDLCALC, TRIG, CHOLHDL, LDLDIRECT in the last 72 hours. Thyroid Function Tests: Recent Labs    09/07/18 0419  TSH 1.911   Anemia Panel: No results for input(s): VITAMINB12, FOLATE, FERRITIN, TIBC, IRON, RETICCTPCT in the last 72 hours. Urine analysis:    Component Value Date/Time    COLORURINE YELLOW 09/07/2018 [place] 09/07/2018 0717   LABSPEC 1.008 09/07/2018 0717   PHURINE 5.0 09/07/2018 0717   GLUCOSEU NEGATIVE 09/07/2018 0717   HGBUR SMALL (A) 09/07/2018 0717   BILIRUBINUR NEGATIVE 09/07/2018 0717   KETONESUR NEGATIVE 09/07/2018 0717   PROTEINUR NEGATIVE 09/07/2018 0717   UROBILINOGEN 1.0 03/23/2014 1915   NITRITE POSITIVE (A) 09/07/2018 0717   LEUKOCYTESUR LARGE (A) 09/07/2018 0717     Bluma Buresh M.D. [facility] 09/09/2018, 11:26 AM  Pager: [phone] Between 7am to 7pm - call Pager - [phone]  After 7pm go to [email or web address] - password [email or web address]  Call night coverage person covering after 7pm

## 2018-09-09 NOTE — Plan of Care (Signed)
  Problem: Safety: Goal: Ability to remain free from injury will improve Outcome: Progressing   

## 2018-09-09 NOTE — Progress Notes (Signed)
Patient reported that she was uncomfortable in bed with her breathing, I suggested getting up and sitting in the chair and educated her how it will help her with her breathing but she refused. Will continue to monitor. Patient oxygen saturation was 94% when I checked it. Will continue to monitor.

## 2018-09-09 NOTE — Consult Note (Addendum)
Advanced Heart Failure Team Consult Note   Primary Physician: Reymundo Poll, MD PCP-HF Cardiologist:  Dr Aundra Dubin  Reason for Consultation: Heart Failure   HPI:    Paige Hardin is seen today for evaluation of heart failure at the request of Dr Caryl Comes.   Paige Hardin is a 72 year old with history of morbid obesity, chronic respiratory failure with supplemental oxygen 4 liters, COPD, PAF, pulmonary nodules,diabetes, hypothyroidism, anemia, and diastolic heart failure. 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   She has been followed by Dr Aundra Dubin and as last seen 06/11/2018. At that time she was volume overloaded. She continued on lasix 160 mg tiwce a day. She was started on 2.5 mg metolazone  Weekly. Weight at that time was 305.8 pounds.   She has been followed by Dr Marin Olp for anemia secondary to iron deficiency, renal insufficiency, and anticoagulation. In August 2 received 2URBCs.  She last received Iron 07/2018.    Admitted with increased shortness of breath. She does not recall weight gain at the SNF. WBC was elevated. Suspected HCAP. Received solumedrol, vancomycin and cefepime. CXR concerning for interstitial pulmonary edema.  Diuresing with high dose IV lasix 120 mg twice a day. Weight has gone down 6 pounds. Pertinent admission labs included: WBC 23, Hgb 7.1, troponin <0.03, creatinine 2.2, lactic Acid 1.34> 0.87>1.2, BNP 1037. Sputum CX with moderate gram + cocci.   Overnight she had ongoing shortness of breath. Having some improvement with breathing treatments.   Echo 05/2018 - Normal LV size with EF 55-60%. Moderate diastolic dysfunction.   Mildly dilated RV with normal systolic function. Biatrial   enlargement. Moderate MR, moderate TR, moderate aortic stenosis.   Dilated IVC suggestive of elevated RV filling pressure. Moderate   pulmonary hypertension. Review of Systems: [y] = yes, [ ]  = no   General: Weight gain [Y ]; Weight loss [ ] ; Anorexia [ ] ; Fatigue [Y  ]; Fever [ ] ; Chills [ ] ; Weakness [Y ]  Cardiac: Chest pain/pressure [ ] ; Resting SOB [Y ]; Exertional SOB [ Y]; Orthopnea [Y ]; Pedal Edema [Y ]; Palpitations [ ] ; Syncope [ ] ; Presyncope [ ] ; Paroxysmal nocturnal dyspnea[ ]   Pulmonary: Cough Paige.Cullens ]; Wheezing[ ] ; Hemoptysis[ ] ; Sputum [ ] ; Snoring [ ]   GI: Vomiting[ ] ; Dysphagia[ ] ; Melena[ ] ; Hematochezia [ ] ; Heartburn[ ] ; Abdominal pain [ ] ; Constipation [ ] ; Diarrhea [ ] ; BRBPR [ ]   GU: Hematuria[ ] ; Dysuria [ ] ; Nocturia[ ]   Vascular: Pain in legs with walking [ ] ; Pain in feet with lying flat [ ] ; Non-healing sores [ ] ; Stroke [ ] ; TIA [ ] ; Slurred speech [ ] ;  Neuro: Headaches[ ] ; Vertigo[ ] ; Seizures[ ] ; Paresthesias[ ] ;Blurred vision [ ] ; Diplopia [ ] ; Vision changes [ ]   Ortho/Skin: Arthritis [ ] ; Joint pain [Y ]; Muscle pain [ ] ; Joint swelling [ ] ; Back Pain [ Y]; Rash [ ]   Psych: Depression[ ] ; Anxiety[ ]   Heme: Bleeding problems [ ] ; Clotting disorders [ ] ; Anemia [ Y]  Endocrine: Diabetes [ Y]; Thyroid dysfunction[Y ]  Home Medications Prior to Admission medications   Medication Sig Start Date End Date Taking? Authorizing Provider  acetaminophen (TYLENOL) 325 MG tablet Take 325 mg by mouth daily as needed for mild pain.    Yes [provider]  albuterol (PROVENTIL HFA;VENTOLIN HFA) 108 (90 Base) MCG/ACT inhaler Inhale 2 puffs into the lungs every 6 (six) hours as needed for wheezing or  shortness of breath.   Yes [provider]  albuterol (PROVENTIL) (2.5 MG/3ML) 0.083% nebulizer solution Take 2.5 mg by nebulization every 6 (six) hours as needed for wheezing or shortness of breath.   Yes [provider]  allopurinol (ZYLOPRIM) 100 MG tablet Take 1 tablet (100 mg total) by mouth daily. 10/16/17  Yes [place], Holli Humbles, NP  amiodarone (PACERONE) 200 MG tablet Take 1 tablet (200 mg total) by mouth daily. 04/23/14  Yes Weaver, Scott T, PA-C  apixaban (ELIQUIS) 5 MG TABS tablet Take 1 tablet (5 mg total) by  mouth 2 (two) times daily. 04/03/14  Yes Minor, Grace Bushy, NP  Ascorbic Acid (VITAMIN C) 500 MG CAPS Take 500 mg by mouth daily.    Yes [provider]  calcium-vitamin D (OSCAL WITH D) 500-200 MG-UNIT per tablet Take 1 tablet by mouth daily with breakfast.   Yes [provider]  Cholecalciferol (VITAMIN D) 2000 UNITS tablet Take 2,000 Units by mouth daily.   Yes [provider]  dextromethorphan-guaiFENesin (MUCINEX DM) 30-600 MG 12hr tablet Take 1 tablet by mouth 2 (two) times daily. 09/06/18 09/20/18 Yes [provider]  diltiazem (CARDIZEM CD) 180 MG 24 hr capsule Take 1 capsule (180 mg total) by mouth daily. 03/12/14  Yes Edmisten, Azzie Roup, PA-C  ezetimibe-simvastatin (VYTORIN) 10-40 MG per tablet Take 1 tablet by mouth at bedtime.    Yes [provider]  furosemide (LASIX) 80 MG tablet Take 2 tablets (160 mg total) by mouth 2 (two) times daily. MAKE SURE TO TAKE AT 8 AM AND 2 PM 04/23/14  Yes Kathlen Mody, Scott T, PA-C  insulin aspart (NOVOLOG) 100 UNIT/ML injection Inject 0-9 Units into the skin 3 (three) times daily with meals. Correction factor Sliding scale CBG 70 - 120: 0 units CBG 121 - 150: 1 unit,  CBG 151 - 200: 2 units,  CBG 201 - 250: 3 units,  CBG 251 - 300: 5 units,  CBG 301 - 350: 7 units,  CBG 351 - 400: 9 units   CBG > 400: 9 units and notify your MD 11/19/15  Yes Rai, Ripudeep K, MD  insulin aspart (NOVOLOG) 100 UNIT/ML injection Inject 12-18 Units into the skin See admin instructions. Inject 18 units in the morning, 12 units at 1100, and 18 units in the evening   Yes [provider]  insulin NPH Human (HUMULIN N,NOVOLIN N) 100 UNIT/ML injection Inject 20-30 Units into the skin See admin instructions. Inject 20 units in the morning, and 30 units in the evening 04/04/16  Yes [provider]  levothyroxine (LEVOXYL) 25 MCG tablet Take 1 tablet (25 mcg total) by mouth daily before breakfast. Patient taking differently: Take 50 mcg  by mouth daily before breakfast.  06/24/18  Yes Bensimhon, Shaune Pascal, MD  metolazone (ZAROXOLYN) 2.5 MG tablet Take 1 tablet every Wednesday Patient taking differently: Take 2.5 mg by mouth every Wednesday.  06/11/18  Yes Larey Dresser, MD  metoprolol (LOPRESSOR) 50 MG tablet Take 1 tablet (50 mg total) by mouth 2 (two) times daily. 03/12/14  Yes Edmisten, Azzie Roup, PA-C  Multiple Vitamins-Iron (MULTIVITAMIN/IRON PO) Take 1 tablet by mouth daily.   Yes [provider]  omeprazole (PRILOSEC) 20 MG capsule Take 20 mg by mouth daily.   Yes [provider]  ondansetron (ZOFRAN) 4 MG tablet Take 4 mg by mouth every 6 (six) hours as needed for nausea or vomiting.  12/04/17  Yes [provider]  OXYGEN Inhale 4  L into the lungs continuous.    Yes [provider]  potassium chloride SA (K-DUR,KLOR-CON) 20 MEQ tablet Take 20 mEq by mouth 3 (three) times daily. **SCHEDULED** 04/23/14  Yes Weaver, Scott T, PA-C  potassium chloride SA (K-DUR,KLOR-CON) 20 MEQ tablet Take 20 mEq by mouth every Wednesday. **Take with scheduled 0700 dose to equal 40 MEQ every Wednesday**   Yes [provider]  predniSONE (DELTASONE) 10 MG tablet Take 10 mg by mouth once. For COPD exacerbration   Yes [provider]  Tiotropium Bromide-Olodaterol (STIOLTO RESPIMAT) 2.5-2.5 MCG/ACT AERS Inhale 2 puffs into the lungs daily. 10/26/17  Yes Collene Gobble, MD  vitamin B-12 (CYANOCOBALAMIN) 500 MCG tablet Take 500 mcg by mouth daily.   Yes [provider]    Past Medical History: Past Medical History:  Diagnosis Date  . Arthritis    "joints" (01/14/2014)  . Carotid atherosclerosis   . Chronic low back pain   . COPD (chronic obstructive pulmonary disease) ([place])   . Depression    "maybe" (01/14/2014  . Diastolic congestive heart failure ([place])   . Emphysema   . Excessive daytime sleepiness 09/08/2015  . GERD (gastroesophageal reflux disease)   . Hoarseness, chronic   .  Hyperlipidemia   . Lung nodules   . Obese   . Paroxysmal atrial fibrillation ([facility])    a. failed flecainide;  b. 03/2014 amio started->DCCV;  c. Chronic pradaxa.  . Pneumonia 1990's   "once"  . Type 2 diabetes mellitus ([place])     Past Surgical History: Past Surgical History:  Procedure Laterality Date  . BREAST BIOPSY Left ~ 1980   "solid"  . BREAST LUMPECTOMY Left ~ 1980   "removed a mass; it was benign" (01/14/2014)  . CARDIOVERSION N/A 03/11/2014   Procedure: CARDIOVERSION;  Surgeon: Sanda Klein, MD;  Location: [facility];  Service: Cardiovascular;  Laterality: N/A;  . CARDIOVERSION N/A 03/27/2014   Procedure: CARDIOVERSION;  Surgeon: Darlin Coco, MD;  Location: [facility];  Service: Cardiovascular;  Laterality: N/A;  . COLONOSCOPY  01/26/2012   Procedure: COLONOSCOPY;  Surgeon: Beryle Beams, MD;  Location: [facility];  Service: Endoscopy;  Laterality: N/A;  . ESOPHAGOGASTRODUODENOSCOPY  01/26/2012   Procedure: ESOPHAGOGASTRODUODENOSCOPY (EGD);  Surgeon: Beryle Beams, MD;  Location: [facility];  Service: Endoscopy;  Laterality: N/A;  . TOTAL ABDOMINAL HYSTERECTOMY  1994    Family History: Family History  Problem Relation Age of Onset  . Breast cancer Sister   . Heart disease Unknown   . Hodgkin's lymphoma Unknown   . Asthma Mother     Social History: Social History   Socioeconomic History  . Marital status: Legally Separated    Spouse name: Not on file  . Number of children: Not on file  . Years of education: Not on file  . Highest education level: Not on file  Occupational History  . Occupation: retired from [employer]  Social Needs  . Financial resource strain: Not on file  . Food insecurity:    Worry: Not on file    Inability: Not on file  . Transportation needs:    Medical: Not on file    Non-medical: Not on file  Tobacco Use  . Smoking status: Former Smoker    Packs/day: 3.00    Years: 45.00    Pack years: 135.00    Types: Cigarettes    Start date:  11/08/1963    Last attempt to quit: 12/28/2008    Years since quitting: 9.7  .  Smokeless tobacco: Never Used  . Tobacco comment: quit smoking 5 years ago  Substance and Sexual Activity  . Alcohol use: Yes    Alcohol/week: 0.0 standard drinks    Comment: 01/14/2014 "used to drink socially in the past; last drink was probably 10 yr ago"  . Drug use: No  . Sexual activity: Not Currently  Lifestyle  . Physical activity:    Days per week: Not on file    Minutes per session: Not on file  . Stress: Not on file  Relationships  . Social connections:    Talks on phone: Not on file    Gets together: Not on file    Attends religious service: Not on file    Active member of club or organization: Not on file    Attends meetings of clubs or organizations: Not on file    Relationship status: Not on file  Other Topics Concern  . Not on file  Social History Narrative  . Not on file    Allergies:  Allergies  Allergen Reactions  . Ace Inhibitors Cough  . Statins Other (See Comments)    Listed on MAR  . Clindamycin Rash    Objective:    Vital Signs:   Temp:  [97.8 F (36.6 C)-98.7 F (37.1 C)] 97.8 F (36.6 C) (10/14 0616) Pulse Rate:  [72-78] 72 (10/14 0616) Resp:  [17-22] 22 (10/14 0616) BP: (128-140)/(56-70) 128/70 (10/14 0616) SpO2:  [90 %-95 %] 90 % (10/14 0719) Weight:  [137 kg] 137 kg (10/14 0605) Last BM Date: 09/05/18  Weight change: Filed Weights   09/07/18 0358 09/08/18 0300 09/09/18 0605  Weight: (!) 139.3 kg (!) 139.8 kg (!) 137 kg    Intake/Output:   Intake/Output Summary (Last 24 hours) at 09/09/2018 0900 Last data filed at 09/09/2018 0600 Gross per 24 hour  Intake 1203.91 ml  Output 1650 ml  Net -446.09 ml      Physical Exam    General:  Appears chronically ill. SOB with exertion.  HEENT: normal Neck: supple. JVP appears elevated . Carotids 2+ bilat; no bruits. No lymphadenopathy or thyromegaly appreciated. Cor: PMI nondisplaced. Regular rate &  rhythm. No rubs, gallops or murmurs. Lungs: EW throughout on 4 liters oxygen.  Abdomen: obese, soft, nontender, nondistended. No hepatosplenomegaly. No bruits or masses. Good bowel sounds. Extremities: no cyanosis, clubbing, rash, R and LLE 1+ edema Neuro: alert & orientedx3, cranial nerves grossly intact. moves all 4 extremities w/o difficulty. Affect pleasant   Telemetry  NSR 70s personally reviewed.   EKG    SR 77 bpm   Labs   Basic Metabolic Panel: Recent Labs  Lab 09/06/18 2238 09/08/18 0442 09/09/18 0507  NA 133* 135 139  K 4.4 4.1 4.0  CL 89* 88* 93*  CO2 34* 34* 35*  GLUCOSE 195* 231* 150*  BUN 54* 65* 69*  CREATININE 2.19* 2.20* 1.99*  CALCIUM 8.9 9.1 9.3    Liver Function Tests: Recent Labs  Lab 09/06/18 2238  AST 22  ALT 13  ALKPHOS 50  BILITOT 1.1  PROT 6.5  ALBUMIN 3.0*   No results for input(s): LIPASE, AMYLASE in the last 168 hours. No results for input(s): AMMONIA in the last 168 hours.  CBC: Recent Labs  Lab 09/06/18 2238 09/07/18 0709 09/07/18 1257 09/08/18 0442 09/09/18 0507  WBC 23.1* 19.8*  --  13.8* 14.4*  NEUTROABS 21.9* 19.4*  --   --   --   HGB 7.1* 7.0* 8.0* 7.9* 8.3*  HCT  24.9* 23.8* 27.0* 26.3* 29.8*  MCV 98.8 97.5  --  95.6 99.3  PLT 189 171  --  189 197    Cardiac Enzymes: Recent Labs  Lab 09/07/18 0035 09/07/18 0251 09/07/18 0709 09/07/18 1257  TROPONINI <0.03 <0.03 <0.03 0.03*    BNP: BNP (last 3 results) Recent Labs    09/07/18 0035  BNP 1,037.1*    ProBNP (last 3 results) No results for input(s): PROBNP in the last 8760 hours.   CBG: Recent Labs  Lab 09/08/18 0750 09/08/18 1223 09/08/18 1654 09/08/18 2137 09/09/18 0750  GLUCAP 217* 233* 140* 171* 149*    Coagulation Studies: No results for input(s): LABPROT, INR in the last 72 hours.   Imaging    No results found.   Medications:     Current Medications: . allopurinol  100 mg Oral Daily  . amiodarone  200 mg Oral Daily  .  apixaban  5 mg Oral BID  . arformoterol  15 mcg Nebulization BID  . budesonide (PULMICORT) nebulizer solution  0.25 mg Nebulization BID  . calcium-vitamin D  1 tablet Oral Q breakfast  . Chlorhexidine Gluconate Cloth  6 each Topical Q0600  . ezetimibe-simvastatin  1 tablet Oral QHS  . guaiFENesin  600 mg Oral BID  . insulin aspart  0-9 Units Subcutaneous TID WC  . insulin NPH Human  23 Units Subcutaneous BID AC & HS  . ipratropium-albuterol  3 mL Nebulization Once  . ipratropium-albuterol  3 mL Nebulization QID  . levothyroxine  25 mcg Oral QAC breakfast  . methylPREDNISolone (SOLU-MEDROL) injection  60 mg Intravenous Q12H  . metoprolol tartrate  50 mg Oral BID  . mupirocin ointment  1 application Nasal BID  . potassium chloride SA  20 mEq Oral TID  . sodium chloride flush  3 mL Intravenous Q12H     Infusions: . sodium chloride Stopped (09/07/18 2000)  . ceFEPime (MAXIPIME) IV 1 g (09/09/18 0327)  . furosemide 120 mg (09/08/18 1723)  . vancomycin 1,750 mg (09/09/18 0352)       Patient Profile   Paige Hardin is a 72 year old with history of morbid obesity, chronic respiratory failure with supplemental oxygen, COPD, PAF, pulmonary nodules, DM, hypothroidism, anemia, and diastolic heart failure. She has been chair bound for a number of years.    Assessment/Plan   1. Dyspnea Multifactorial- COPD, A/C Diastolic HF, and possible HCAP.  Preliminary sputum culture- with moderate gram + cocci.  - WBC on admit 23>14 - On admit started on vancomycin and cefepime.   - On nebulizers and steroids.   2. A/C Diastolic Heart Failure -ECHO 05/2018 EF 55-60% Grade II DD BNP on admit elevated -->1037 Continue to diurese with 120 mg IV lasix twice a day. Prior to admit she was taking lasix 160 mg po twice a day + weekly metolazone.  -Anticipate switching to torsemide 80 mg twice a day.   Renal function stable.   3. CKD stage IV Creatinine on admit 2.2  Creatinine baseline 2-2/3  Follow  BMET daily.   4. Anemia Followed by Dr Marin Olp in the community. Anemia  secondary to iron deficiency, renal insufficiency, and anticoagulation. In August 2 received 2URBCs.  She last received Iron 07/2018.   Hgb 8.3 . No obvious source of bleeding.   5. Morbid Obesity  Body mass index is 50.26 kg/m.  6. PAF Maintaining NSR on amio 200 mg daily.  - Has been followed by Dr Lamonte Sakai. Last seen 09/2017. No evidence  of amio toxicity.  - TSH and LFTs ok.  -On eliquis. Check CBC daily  7. Hypothyroidism  -TSH 1.9. On synthyroid  8. Pulmonary Nodule  -On CT of chest 11/2017 . Followed by Dr Lamonte Sakai.   9. DNR   Medication concerns reviewed with patient and pharmacy team. Barriers identified: none   Length of Stay: 2  Amy Clegg, NP  09/09/2018, 9:00 AM  Advanced Heart Failure Team Pager [phone] (M-F; [place])  Please contact [facility] for night-coverage after hours (4p -7a ) and weekends on [email or web address]  Patient seen and examined with the above-signed Advanced Practice Provider and/or Housestaff. I personally reviewed laboratory data, imaging studies and relevant notes. I independently examined the patient and formulated the important aspects of the plan. I have edited the note to reflect any of my changes or salient points. I have personally discussed the plan with the patient and/or family.  Paige Hardin is a 72 y/o woman with morbid obesity, COPD with chronic respiratory failure on home O2, CKD 4 and diastolic HF admitted with acute on chronic respiratory failure. Currently resides at [facility] and is very limited at baseline. Now being treated for COPD flare and CHF. Weight down 8 pounds. Breathing getting close to baseline. Neck veins still elevated on exam but edema nearly resolved. Creatinine stable. Will continue IV diuresis one more day and then switch back to po diuretics. Stressed need to avoid extra fluid intake.  Glori Bickers, MD  1:32 PM

## 2018-09-10 ENCOUNTER — Ambulatory Visit: Payer: Medicare Other | Admitting: Hematology & Oncology

## 2018-09-10 ENCOUNTER — Other Ambulatory Visit: Payer: Medicare Other

## 2018-09-10 ENCOUNTER — Ambulatory Visit: Payer: Medicare Other

## 2018-09-10 LAB — BASIC METABOLIC PANEL
ANION GAP: 12 (ref 5–15)
BUN: 67 mg/dL — ABNORMAL HIGH (ref 8–23)
CALCIUM: 9.8 mg/dL (ref 8.9–10.3)
CO2: 36 mmol/L — ABNORMAL HIGH (ref 22–32)
CREATININE: 1.61 mg/dL — AB (ref 0.44–1.00)
Chloride: 92 mmol/L — ABNORMAL LOW (ref 98–111)
GFR, EST AFRICAN AMERICAN: 36 mL/min — AB (ref >60)
GFR, EST NON AFRICAN AMERICAN: 31 mL/min — AB (ref >60)
GLUCOSE: 205 mg/dL — AB (ref 70–99)
Potassium: 4.2 mmol/L (ref 3.5–5.1)
Sodium: 140 mmol/L (ref 135–145)

## 2018-09-10 LAB — CBC
HCT: 28 % — ABNORMAL LOW (ref 36.0–46.0)
Hemoglobin: 8.2 g/dL — ABNORMAL LOW (ref 12.0–15.0)
MCH: 28.3 pg (ref 26.0–34.0)
MCHC: 29.3 g/dL — ABNORMAL LOW (ref 30.0–36.0)
MCV: 96.6 fL (ref 80.0–100.0)
PLATELETS: 185 10*3/uL (ref 150–400)
RBC: 2.9 MIL/uL — AB (ref 3.87–5.11)
RDW: 17.9 % — AB (ref 11.5–15.5)
WBC: 7.3 10*3/uL (ref 4.0–10.5)
nRBC: 0 % (ref 0.0–0.2)

## 2018-09-10 LAB — GLUCOSE, CAPILLARY
GLUCOSE-CAPILLARY: 188 mg/dL — AB (ref 70–99)
GLUCOSE-CAPILLARY: 222 mg/dL — AB (ref 70–99)
Glucose-Capillary: 291 mg/dL — ABNORMAL HIGH (ref 70–99)
Glucose-Capillary: 232 mg/dL — ABNORMAL HIGH (ref 70–99)

## 2018-09-10 MED ORDER — INSULIN NPH (HUMAN) (ISOPHANE) 100 UNIT/ML ~~LOC~~ SUSP
25.0000 [IU] | Freq: Two times a day (BID) | SUBCUTANEOUS | Status: DC
  Administered 2018-09-10 – 2018-09-14 (×7): 25 [IU] via SUBCUTANEOUS
  Filled 2018-09-10: qty 10

## 2018-09-10 MED ORDER — INSULIN ASPART 100 UNIT/ML ~~LOC~~ SOLN
0.0000 [IU] | Freq: Three times a day (TID) | SUBCUTANEOUS | Status: DC
  Administered 2018-09-10: 8 [IU] via SUBCUTANEOUS
  Administered 2018-09-10: 5 [IU] via SUBCUTANEOUS
  Administered 2018-09-11: 2 [IU] via SUBCUTANEOUS
  Administered 2018-09-11: 3 [IU] via SUBCUTANEOUS
  Administered 2018-09-11: 5 [IU] via SUBCUTANEOUS
  Administered 2018-09-12 (×3): 3 [IU] via SUBCUTANEOUS
  Administered 2018-09-13: 2 [IU] via SUBCUTANEOUS
  Administered 2018-09-13 – 2018-09-14 (×2): 5 [IU] via SUBCUTANEOUS

## 2018-09-10 MED ORDER — VANCOMYCIN HCL 10 G IV SOLR
1500.0000 mg | INTRAVENOUS | Status: DC
  Administered 2018-09-10 – 2018-09-12 (×3): 1500 mg via INTRAVENOUS
  Filled 2018-09-10 (×4): qty 1500

## 2018-09-10 NOTE — Progress Notes (Addendum)
Advanced Heart Failure Rounding Note  PCP-Cardiologist: No primary care provider on file.   Subjective:    Continues to diurese with high dose IV lasix. Weight down another 2 pounds. Creatinine continues to trend down 1.9>1.6.   Feeling better today. Denies SOB.      Objective:   Weight Range: 136 kg Body mass index is 49.89 kg/m.   Vital Signs:   Temp:  [97.7 F (36.5 C)-98.5 F (36.9 C)] 98.5 F (36.9 C) (10/15 0351) Pulse Rate:  [75-80] 75 (10/15 0351) Resp:  [18] 18 (10/15 0351) BP: (130-157)/(58-76) 149/58 (10/15 0351) SpO2:  [89 %-95 %] 95 % (10/15 0351) Weight:  [136 kg] 136 kg (10/15 0351) Last BM Date: 09/05/18  Weight change: Filed Weights   09/08/18 0300 09/09/18 0605 09/10/18 0351  Weight: (!) 139.8 kg (!) 137 kg 136 kg    Intake/Output:   Intake/Output Summary (Last 24 hours) at 09/10/2018 0726 Last data filed at 09/10/2018 0359 Gross per 24 hour  Intake 240 ml  Output 2200 ml  Net -1960 ml      Physical Exam    General:  Appears chronically ill.  No resp difficulty HEENT: Normal Neck: Supple. JVP 9-10 . Carotids 2+ bilat; no bruits. No lymphadenopathy or thyromegaly appreciated. Cor: PMI nondisplaced. Regular rate & rhythm. No rubs, gallops or murmurs. Lungs: Decreased breath sounds. No wheeze. On 4 liters oxygen.  Abdomen: obese, Soft, nontender, nondistended. No hepatosplenomegaly. No bruits or masses. Good bowel sounds. Extremities: No cyanosis, clubbing, rash, R and LLE trace-1+ edema Neuro: Alert & orientedx3, cranial nerves grossly intact. moves all 4 extremities w/o difficulty. Affect pleasant   Telemetry   SR 70-80s personally reviewed.   EKG   n/a  Labs    CBC Recent Labs    09/09/18 0507 09/10/18 0508  WBC 14.4* 7.3  HGB 8.3* 8.2*  HCT 29.8* 28.0*  MCV 99.3 96.6  PLT 197 885   Basic Metabolic Panel Recent Labs    09/09/18 0507 09/10/18 0508  NA 139 140  K 4.0 4.2  CL 93* 92*  CO2 35* 36*  GLUCOSE  150* 205*  BUN 69* 67*  CREATININE 1.99* 1.61*  CALCIUM 9.3 9.8   Liver Function Tests No results for input(s): AST, ALT, ALKPHOS, BILITOT, PROT, ALBUMIN in the last 72 hours. No results for input(s): LIPASE, AMYLASE in the last 72 hours. Cardiac Enzymes Recent Labs    09/07/18 1257  TROPONINI 0.03*    BNP: BNP (last 3 results) Recent Labs    09/07/18 0035  BNP 1,037.1*    ProBNP (last 3 results) No results for input(s): PROBNP in the last 8760 hours.   D-Dimer No results for input(s): DDIMER in the last 72 hours. Hemoglobin A1C Recent Labs    09/08/18 0442  HGBA1C 4.9   Fasting Lipid Panel No results for input(s): CHOL, HDL, LDLCALC, TRIG, CHOLHDL, LDLDIRECT in the last 72 hours. Thyroid Function Tests No results for input(s): TSH, T4TOTAL, T3FREE, THYROIDAB in the last 72 hours.  Invalid input(s): FREET3  Other results:   Imaging     No results found.   Medications:     Scheduled Medications: . allopurinol  100 mg Oral Daily  . amiodarone  200 mg Oral Daily  . apixaban  5 mg Oral BID  . arformoterol  15 mcg Nebulization BID  . budesonide (PULMICORT) nebulizer solution  0.25 mg Nebulization BID  . calcium-vitamin D  1 tablet Oral Q breakfast  . Chlorhexidine  Gluconate Cloth  6 each Topical Q0600  . ezetimibe-simvastatin  1 tablet Oral QHS  . guaiFENesin  600 mg Oral BID  . insulin aspart  0-9 Units Subcutaneous TID WC  . insulin NPH Human  23 Units Subcutaneous BID AC & HS  . ipratropium-albuterol  3 mL Nebulization Once  . ipratropium-albuterol  3 mL Nebulization QID  . levothyroxine  25 mcg Oral QAC breakfast  . methylPREDNISolone (SOLU-MEDROL) injection  60 mg Intravenous Q12H  . metoprolol tartrate  50 mg Oral BID  . mupirocin ointment  1 application Nasal BID  . potassium chloride SA  20 mEq Oral TID  . sodium chloride flush  3 mL Intravenous Q12H     Infusions: . sodium chloride Stopped (09/07/18 2000)  . ceFEPime (MAXIPIME) IV 1  g (09/10/18 0511)  . furosemide 120 mg (09/09/18 1739)  . vancomycin 1,750 mg (09/09/18 0352)     PRN Medications:  sodium chloride, acetaminophen, albuterol, guaiFENesin-dextromethorphan, promethazine, sodium chloride flush    Patient Profile  Paige Hardin is a 72 year old with history of morbid obesity, chronic respiratory failure with supplemental oxygen, COPD, PAF, pulmonary nodules, DM, hypothroidism, anemia, and diastolic heart failure. She has been chair bound for a number of years.   Assessment/Plan   1. Dyspnea Multifactorial- COPD, A/C Diastolic HF, and possible HCAP.  Preliminary sputum culture- with moderate gram + cocci.  - WBC on admit 23>14>8.3 . Afebrile  - On admit started on vancomycin and cefepime.  - On nebulizers and steroids.  - She is on 2L [place] at baseline for COPD.   2. A/C Diastolic Heart Failure -ECHO 05/2018 EF 55-60% Grade II DD BNP on admit elevated -->1037 Continue to diurese with 120 mg IV lasix twice a day. Prior to admit she was taking lasix 160 mg po twice a day + weekly metolazone. Needs one more day of IV lasi -Anticipate switching to torsemide 80 mg twice a day.   Renal function stable.   3. CKD stage IV Creatinine on admit 2.2  Creatinine baseline 2-2/3  Todays creatinine 1.6   4. Anemia Followed by Dr Marin Olp in the community. Anemia  secondary to iron deficiency, renal insufficiency, and anticoagulation. In August 2 received 2URBCs.  She last received Iron 07/2018.   Hgb8.2 - No obvious source of bleeding.   5. Morbid Obesity  Body mass index is 50.26 kg/m.  6. PAF Maintaining NSR on amio 200 mg daily.  - Has been followed by Dr Lamonte Sakai. Last seen 09/2017. No evidence of amio toxicity.  - TSH and LFTs ok.  -On eliquis.  - hgb 8.2.  7. Hypothyroidism  -TSH 1.9. On synthyroid  8. Pulmonary Nodule  -On CT of chest 11/2017 . Followed by Dr Lamonte Sakai.   9. Aortic stenosis: Moderate on 7/19 echo.   10. DNR   Needs one more day  of IV lasix.   Length of Stay: 3  Paige Clegg, Paige Hardin  09/10/2018, 7:26 AM  Advanced Heart Failure Team Pager [phone] (M-F; 7a - 4p)  Please contact [facility] for night-coverage after hours (4p -7a ) and weekends on [email or web address]  Patient seen with Paige Hardin, agree with the above note.    She has been admitted with PNA + AECOPD.  Also with acute on chronic diastolic CHF.  She remains in NSR on amiodarone.    On exam, JVP 10-12 cm.  Decreased BS bilaterally.  Regular S1S2, 2/6 SEM RUSB.  1+ chronic ankle edema.   She will  continue vancomycin/cefepime + Solumedrol IV per primary service.   On exam, she remains volume overloaded.  Continue Lasix 120 mg IV bid today.  Creatinine lower today at 1.6.  When we have her diuresed, would transition over to torsemide 80 mg bid.    Continue amiodarone to maintain NSR.   Aortic stenosis was moderate on 7/19 echo.   Paige Hardin 09/10/2018 9:58 AM

## 2018-09-10 NOTE — Progress Notes (Signed)
Pt was getting cleaned upafter she was on bedpan for bm and was lying flat, noted bluish in  face, pt sat up on side of bed sats 68-75% on 4l/Onset sats up to 75% on 5l/n,92  on 6l/[place], paged Dr Tana Coast to update, nice says her sats drop with little exertion, pt sitting on side of bed eating without disttress, pt was slight dyspnic when first sat up but color pink and no distress when on 6lnc.

## 2018-09-10 NOTE — Progress Notes (Signed)
[facility]                                                                              Patient Demographics  Paige Hardin, is a 72 y.o. female, DOB - 12-23-1945, AST:[id number]  Admit date - 09/06/2018   Admitting Physician Paige Morton, MD  Outpatient Primary MD for the patient is Paige Poll, MD  Outpatient specialists:   LOS - 3  days   Medical records reviewed and are as summarized below:    Chief Complaint  Patient presents with  . Shortness of Breath       Brief summary   Patient is a 72 year old female with history of A. fib on Eliquis, COPD, chronic respiratory failure, oxygen dependent 4 L, diastolic CHF, iron deficiency anemia requiring frequent blood transfusions, follows Dr. Marin Hardin, presented with progressively worsening shortness of breath and cough over the last 10 days.  Patient lives in a retirement home/ALF.  She reported productive cough, hypoxia in 80s on her regular O2, fatigue, nausea, lower extremity edema, orthopnea, sleeping in recliner and fever of 100.5 F.  She used multiple breathing treatments with no significant relief.  In ED, patient was placed on O2 5 L, was given prolonged albuterol treatment with salmeterol.  WBCs 23.1 at the time of admission.   Assessment & Plan    Principal Problem: Acute on chronic respiratory failure with hypoxia secondary to acute on chronic diastolic CHF, symptomatic anemia, HCAP -2D echo 06/11/2018 showed EF of 55 to 60% with grade 2 diastolic dysfunction -States shortness of breath is somewhat better, still O2 sats 89% on 5 L.   Active problems Acute on chronic diastolic CHF exacerbation -BNP 1037, chest x-ray showing cardiomegaly with pulmonary edema, peripheral edema with orthopnea -Continue Lasix 120 mg IV every 12 hours, negative balance of 2.3 L,  -Creatinine improving, 1.6 today - CHF team following  Sepsis secondary to suspected HCAP, COPD exacerbation -Influenza negative,  respiratory virus panel negative -Blood cultures negative so far, -Sputum cultures with moderate rothia mucilaginosa, rare staph aureus -Urine strep antigen negative.   -Continue IV vancomycin and cefepime -Wheezing slightly better today, continue Pulmicort, scheduled nebs, IV steroids 60 mg every 12 hours, taper tomorrow if wheezing better   UTI -UA positive for UTI, many bacteria, WBCs 21-50, positive leukocytes, nitrite -Urine culture showed multiple species, on IV cefepime   Prolonged QTC Initial EKG showed QTC 544,-> 516 on 10/12   Iron deficiency anemia, acute on chronic -Followed by Dr. Marin Hardin, has received as needed transfusions, last received darbepoetin infusion on 9/24 -Baseline  ~ 8 . Status post 1 unit packed RBC and Aranesp on 10/12 -Hemoglobin 8.2, at baseline   Paroxysmal atrial fibrillation -Currently normal sinus rhythm, continue amiodarone -Continue Eliquis   Diabetes mellitus type 2, uncontrolled with hyperglycemia -CBGs now increasing due to IV steroids.   -Increase NPH insulin to 25 units twice a day, moderate sliding scale  Hemoglobin A1c 4.9  Acute on CKD IV Creatinine 2.1 at the time of admission, baseline ranged from 1.6-2.7 -Creatinine improving, 1.6 today  Hypothyroidism -Continue levothyroxine, TSH 1.9  GERD Held PPI secondary to prolonged  QTC  Morbid obesity BMI 51, counseled on diet and weight control   Code Status: DNR DVT Prophylaxis: Apixaban Family Communication: Discussed in detail with the patient, all imaging results, lab results explained to the patient    Disposition Plan: Pending clinical improvement  Time Spent in minutes 25 minutes  Procedures:  None  Consultants:   Cardiology  Antimicrobials:   IV vancomycin, IV cefepime 10/12   Medications  Scheduled Meds: . allopurinol  100 mg Oral Daily  . amiodarone  200 mg Oral Daily  . apixaban  5 mg Oral BID  . arformoterol  15 mcg Nebulization BID  .  budesonide (PULMICORT) nebulizer solution  0.25 mg Nebulization BID  . calcium-vitamin D  1 tablet Oral Q breakfast  . Chlorhexidine Gluconate Cloth  6 each Topical Q0600  . ezetimibe-simvastatin  1 tablet Oral QHS  . guaiFENesin  600 mg Oral BID  . insulin aspart  0-9 Units Subcutaneous TID WC  . insulin NPH Human  23 Units Subcutaneous BID AC & HS  . ipratropium-albuterol  3 mL Nebulization Once  . ipratropium-albuterol  3 mL Nebulization QID  . levothyroxine  25 mcg Oral QAC breakfast  . methylPREDNISolone (SOLU-MEDROL) injection  60 mg Intravenous Q12H  . metoprolol tartrate  50 mg Oral BID  . mupirocin ointment  1 application Nasal BID  . potassium chloride SA  20 mEq Oral TID  . sodium chloride flush  3 mL Intravenous Q12H   Continuous Infusions: . sodium chloride Stopped (09/07/18 2000)  . ceFEPime (MAXIPIME) IV 1 g (09/10/18 0511)  . furosemide 120 mg (09/10/18 0757)  . vancomycin 1,750 mg (09/09/18 0352)   PRN Meds:.sodium chloride, acetaminophen, albuterol, guaiFENesin-dextromethorphan, promethazine, sodium chloride flush   Antibiotics   Anti-infectives (From admission, onward)   Start     Dose/Rate Route Frequency Ordered Stop   09/07/18 0300  ceFEPIme (MAXIPIME) 1 g in sodium chloride 0.9 % 100 mL IVPB     1 g 200 mL/hr over 30 Minutes Intravenous Every 24 hours 09/07/18 0234 09/15/18 0259   09/07/18 0300  vancomycin (VANCOCIN) 1,750 mg in sodium chloride 0.9 % 500 mL IVPB     1,750 mg 250 mL/hr over 120 Minutes Intravenous Every 48 hours 09/07/18 0255     09/07/18 0245  azithromycin (ZITHROMAX) 500 mg in sodium chloride 0.9 % 250 mL IVPB  Status:  Discontinued     500 mg 250 mL/hr over 60 Minutes Intravenous  Once 09/07/18 0234 09/07/18 0302        Subjective:   Paige Hardin was seen and examined today.  Shortness of breath is improving, still has expiratory wheezing.  No chest pain, nausea vomiting.  No fevers or chills.  No acute issues overnight.     Objective:   Vitals:   09/09/18 1945 09/10/18 0351 09/10/18 0805 09/10/18 0820  BP: 134/65 (!) 149/58 138/69   Pulse: 80 75 72   Resp: 18 18    Temp: 97.7 F (36.5 C) 98.5 F (36.9 C)    TempSrc: Oral Oral    SpO2: 90% 95% 91% (!) 89%  Weight:  136 kg    Height:        Intake/Output Summary (Last 24 hours) at 09/10/2018 1055 Last data filed at 09/10/2018 0800 Gross per 24 hour  Intake 363 ml  Output 2200 ml  Net -1837 ml     Wt Readings from Last 3 Encounters:  09/10/18 136 kg  08/20/18 (!) 136.7 kg  07/23/18 Mar[place] Kitchen)  137.5 kg     Exam   General: Alert and oriented x 3, NAD  Eyes:   HEENT:  Atraumatic, normocephalic  Cardiovascular: S1 S2 auscultated,  RRR. 1+ pedal edema b/l  Respiratory: Bilateral expiratory wheezing much improved from yesterday  Gastrointestinal: Soft, nontender, nondistended, + bowel sounds  Ext: trace - 1+ pedal edema bilaterally  Neuro: no new deficits  Musculoskeletal: No digital cyanosis, clubbing  Skin: No rashes  Psych: Normal affect and demeanor, alert and oriented x3    Data Reviewed:  I have personally reviewed following labs and imaging studies  Micro Results Recent Results (from the past 240 hour(s))  Culture, blood (routine x 2)     Status: None (Preliminary result)   Collection Time: 09/07/18  2:38 AM  Result Value Ref Range Status   Specimen Description BLOOD RIGHT HAND  Final   Special Requests   Final    BOTTLES DRAWN AEROBIC AND ANAEROBIC Blood Culture adequate volume   Culture   Final    NO GROWTH 1 DAY Performed at [facility], [address], [place], [place] [address]    Report Status PENDING  Incomplete  Culture, blood (routine x 2)     Status: None (Preliminary result)   Collection Time: 09/07/18  2:55 AM  Result Value Ref Range Status   Specimen Description BLOOD LEFT FOREARM  Final   Special Requests   Final    BOTTLES DRAWN AEROBIC AND ANAEROBIC Blood Culture adequate volume   Culture    Final    NO GROWTH 1 DAY Performed at [facility], [place] [address] [place], [place] [address]    Report Status PENDING  Incomplete  Respiratory Panel by PCR     Status: None   Collection Time: 09/07/18  3:05 AM  Result Value Ref Range Status   Adenovirus NOT DETECTED NOT DETECTED Final   Coronavirus 229E NOT DETECTED NOT DETECTED Final   Coronavirus HKU1 NOT DETECTED NOT DETECTED Final   Coronavirus NL63 NOT DETECTED NOT DETECTED Final   Coronavirus OC43 NOT DETECTED NOT DETECTED Final   Metapneumovirus NOT DETECTED NOT DETECTED Final   Rhinovirus / Enterovirus NOT DETECTED NOT DETECTED Final   Influenza A NOT DETECTED NOT DETECTED Final   Influenza A H1 NOT DETECTED NOT DETECTED Final   Influenza A H1 2009 NOT DETECTED NOT DETECTED Final   Influenza A H3 NOT DETECTED NOT DETECTED Final   Influenza B NOT DETECTED NOT DETECTED Final   Parainfluenza Virus 1 NOT DETECTED NOT DETECTED Final   Parainfluenza Virus 2 NOT DETECTED NOT DETECTED Final   Parainfluenza Virus 3 NOT DETECTED NOT DETECTED Final   Parainfluenza Virus 4 NOT DETECTED NOT DETECTED Final   Respiratory Syncytial Virus NOT DETECTED NOT DETECTED Final   Bordetella pertussis NOT DETECTED NOT DETECTED Final   Chlamydophila pneumoniae NOT DETECTED NOT DETECTED Final   Mycoplasma pneumoniae NOT DETECTED NOT DETECTED Final    Comment: Performed at [facility], [place] [address] [place], [place] [address]  MRSA PCR Screening     Status: Abnormal   Collection Time: 09/07/18  6:25 AM  Result Value Ref Range Status   MRSA by PCR POSITIVE (A) NEGATIVE Final    Comment:        The GeneXpert MRSA Assay (FDA approved for NASAL specimens only), is one component of a comprehensive MRSA colonization surveillance program. It is not intended to diagnose MRSA infection nor to guide or monitor treatment for  MRSA infections. RESULT CALLED TO, READ BACK BY AND VERIFIED WITH: RN Z IDI [id number] MLM Performed at [facility], [place] [address] [place], [place] [address]   Urine Culture     Status: Abnormal   Collection Time: 09/07/18  2:29 PM  Result Value Ref Range Status   Specimen Description URINE, RANDOM  Final   Special Requests   Final    NONE Performed at [facility], [place] [address], [place], [place] [address]    Culture MULTIPLE SPECIES PRESENT, SUGGEST RECOLLECTION (A)  Final   Report Status 09/08/2018 FINAL  Final  Culture, sputum-assessment     Status: None   Collection Time: 09/07/18  9:39 PM  Result Value Ref Range Status   Specimen Description SPUTUM  Final   Special Requests NONE  Final   Sputum evaluation   Final    THIS SPECIMEN IS ACCEPTABLE FOR SPUTUM CULTURE Performed at [facility], [place] [address], [place], [place] [address]    Report Status 09/08/2018 FINAL  Final  Culture, respiratory     Status: None (Preliminary result)   Collection Time: 09/07/18  9:39 PM  Result Value Ref Range Status   Specimen Description SPUTUM  Final   Special Requests NONE Reflexed from [id number]  Final   Gram Stain   Final    MODERATE WBC PRESENT, PREDOMINANTLY PMN MODERATE GRAM POSITIVE COCCI    Culture   Final    MODERATE ROTHIA MUCILAGINOSA Standardized susceptibility testing for this organism is not available. RARE STAPHYLOCOCCUS AUREUS SUSCEPTIBILITIES TO FOLLOW Performed at [facility], [place] [address] [place], [place] [address]    Report Status PENDING  Incomplete    Radiology Reports Dg Chest 2 View  Result Date: 09/06/2018 CLINICAL DATA:  72 year old female presents with dyspnea. EXAM: CHEST - 2 VIEW COMPARISON:  Chest CT 12/18/2017, CXR 10/17/2016 FINDINGS: The cardiopericardial silhouette is enlarged as before. Diffuse interstitial edema is accentuated on current exam. Moderate aortic atherosclerosis with slight uncoiling. No aortic aneurysm. No acute osseous abnormality. No significant pleural effusion or pneumothorax. The posterior costophrenic angles  are excluded on the lateral view. IMPRESSION: Cardiomegaly with diffuse interstitial pulmonary edema. Aortic atherosclerosis without aneurysm. Electronically Signed   By: Ashley Royalty M.D.   On: 09/06/2018 23:32    Lab Data:  CBC: Recent Labs  Lab 09/06/18 2238 09/07/18 0709 09/07/18 1257 09/08/18 0442 09/09/18 0507 09/10/18 0508  WBC 23.1* 19.8*  --  13.8* 14.4* 7.3  NEUTROABS 21.9* 19.4*  --   --   --   --   HGB 7.1* 7.0* 8.0* 7.9* 8.3* 8.2*  HCT 24.9* 23.8* 27.0* 26.3* 29.8* 28.0*  MCV 98.8 97.5  --  95.6 99.3 96.6  PLT 189 171  --  189 197 825   Basic Metabolic Panel: Recent Labs  Lab 09/06/18 2238 09/08/18 0442 09/09/18 0507 09/10/18 0508  NA 133* 135 139 140  K 4.4 4.1 4.0 4.2  CL 89* 88* 93* 92*  CO2 34* 34* 35* 36*  GLUCOSE 195* 231* 150* 205*  BUN 54* 65* 69* 67*  CREATININE 2.19* 2.20* 1.99* 1.61*  CALCIUM 8.9 9.1 9.3 9.8   GFR: Estimated Creatinine Clearance: 43.5 mL/min (A) (by C-G formula based on SCr of 1.61 mg/dL (H)). Liver Function Tests: Recent Labs  Lab 09/06/18 2238  AST 22  ALT 13  ALKPHOS 50  BILITOT 1.1  PROT 6.5  ALBUMIN 3.0*   No results for input(s): LIPASE,  AMYLASE in the last 168 hours. No results for input(s): AMMONIA in the last 168 hours. Coagulation Profile: No results for input(s): INR, PROTIME in the last 168 hours. Cardiac Enzymes: Recent Labs  Lab 09/07/18 0035 09/07/18 0251 09/07/18 0709 09/07/18 1257  TROPONINI <0.03 <0.03 <0.03 0.03*   BNP (last 3 results) No results for input(s): PROBNP in the last 8760 hours. HbA1C: Recent Labs    09/08/18 0442  HGBA1C 4.9   CBG: Recent Labs  Lab 09/09/18 0750 09/09/18 1150 09/09/18 1700 09/09/18 2109 09/10/18 0715  GLUCAP 149* 172* 190* 230* 188*   Lipid Profile: No results for input(s): CHOL, HDL, LDLCALC, TRIG, CHOLHDL, LDLDIRECT in the last 72 hours. Thyroid Function Tests: No results for input(s): TSH, T4TOTAL, FREET4, T3FREE, THYROIDAB in the last 72  hours. Anemia Panel: No results for input(s): VITAMINB12, FOLATE, FERRITIN, TIBC, IRON, RETICCTPCT in the last 72 hours. Urine analysis:    Component Value Date/Time   COLORURINE YELLOW 09/07/2018 [place] 09/07/2018 0717   LABSPEC 1.008 09/07/2018 0717   PHURINE 5.0 09/07/2018 0717   GLUCOSEU NEGATIVE 09/07/2018 0717   HGBUR SMALL (A) 09/07/2018 0717   BILIRUBINUR NEGATIVE 09/07/2018 0717   KETONESUR NEGATIVE 09/07/2018 0717   PROTEINUR NEGATIVE 09/07/2018 0717   UROBILINOGEN 1.0 03/23/2014 1915   NITRITE POSITIVE (A) 09/07/2018 0717   LEUKOCYTESUR LARGE (A) 09/07/2018 0717     Ripudeep Rai M.D. [facility] 09/10/2018, 10:55 AM  Pager: [phone] Between 7am to 7pm - call Pager - [phone]  After 7pm go to [email or web address] - password [email or web address]  Call night coverage person covering after 7pm

## 2018-09-10 NOTE — Progress Notes (Signed)
Pharmacy Antibiotic Note  Paige Hardin is a 72 y.o. female admitted on 09/06/2018 with pneumonia.  Pharmacy has been consulted for Vancomycin dosing. Remains afebrile, WBC normalized. CrCl improving to 43.50mL/min, will adjust vancomycin dosing.  10/12: Sputum culture with rare staph. aureus; susceptibilities to follow. 10/12 UCx: multiple species present 10/12: MRSA positive  Plan: Change Vancomycin to 1500 mg IV q24h with improved renal function Cefepime per MD Consider LoT; as 10/16 is 5 days of therapy for CAP Trend renal function, infection resolution, drug levels as indicated  Temp (24hrs), Avg:98 F (36.7 C), Min:97.7 F (36.5 C), Max:98.5 F (36.9 C)  Recent Labs  Lab 09/06/18 2238 09/06/18 2250 09/07/18 0053 09/07/18 0419 09/07/18 0709 09/08/18 0442 09/09/18 0507 09/10/18 0508  WBC 23.1*  --   --   --  19.8* 13.8* 14.4* 7.3  CREATININE 2.19*  --   --   --   --  2.20* 1.99* 1.61*  LATICACIDVEN  --  1.34 0.87 1.2  --   --   --   --     Estimated Creatinine Clearance: 43.5 mL/min (A) (by C-G formula based on SCr of 1.61 mg/dL (H)).    Allergies  Allergen Reactions  . Ace Inhibitors Cough  . Statins Other (See Comments)    Listed on MAR  . Clindamycin Rash   Thank you for involving pharmacy in this patient's care.  Janae Bridgeman, PharmD PGY1 Pharmacy Resident Phone: [phone] 09/10/2018 10:41 AM

## 2018-09-10 NOTE — Clinical Social Work Note (Signed)
Clinical Social Work Assessment  Patient Details  Name: Paige Hardin MRN: [id number] Date of Birth: 1946-09-01  Date of referral:  09/10/18               Reason for consult:  Discharge Planning                Permission sought to share information with:  Facility Sport and exercise psychologist, Family Supports Permission granted to share information::  Yes, Verbal Permission Granted  Name::        Agency::  [facility]  Relationship::  Family member at Engineering geologist Information:     Housing/Transportation Living arrangements for the past 2 months:  [place] of Information:  Patient, Medical Team, Other (Comment Required)(Relative) Patient Interpreter Needed:  None, [place] Criminal Activity/Legal Involvement Pertinent to Current Situation/Hospitalization:    Significant Relationships:  Other Family Members Lives with:  Facility Resident Do you feel safe going back to the place where you live?  Yes Need for family participation in patient care:  Yes (Comment)  Care giving concerns:  Patient is a resident at [facility].   Social Worker assessment / plan:  CSW met with patient. Relative at bedside. CSW introduced role and explained that discharge planning would be discussed. Patient confirmed she is from [facility] and plans to return at discharge. Patient's relative would like patient to be evaluated by PT and OT prior to discharge to make sure she does not need SNF. CSW paged MD to notify. No further concerns. CSW encouraged patient and her relative to contact CSW as needed. CSW will continue to follow patient and her relative for support and facilitate discharge to ALF vs. SNF once medically stable.  Employment status:  Retired Forensic scientist:  Medicare PT Recommendations:  Not assessed at this time [place] / Referral to community resources:  Other (Comment Required)(ALF)  Patient/Family's Response to care:  Patient and  her relative agreeable to return to ALF if able to meet physical expectations. Patient's family supportive and involved in patient's care. Patient and her relative appreciated social work intervention.  Patient/Family's Understanding of and Emotional Response to Diagnosis, Current Treatment, and Prognosis:  Patient and her relative have a good understanding of the reason for admission and her potential need for SNF rather than ALF. Patient and her relative appear happy with hospital care.  Emotional Assessment Appearance:  Appears stated age Attitude/Demeanor/Rapport:  Engaged, Gracious Affect (typically observed):  Accepting, Appropriate, Calm, Pleasant Orientation:  Oriented to Self, Oriented to Place, Oriented to  Time, Oriented to Situation Alcohol / Substance use:  Never Used Psych involvement (Current and /or in the community):  No (Comment)  Discharge Needs  Concerns to be addressed:  Care Coordination Readmission within the last 30 days:  No Current discharge risk:  None Barriers to Discharge:  Continued Medical Work up   Candie Chroman, LCSW 09/10/2018, 11:36 AM

## 2018-09-10 NOTE — Progress Notes (Signed)
Patient scheduled a hospital follow-up appt in the [facility] on 09/23/18 at 0930.

## 2018-09-11 LAB — BASIC METABOLIC PANEL
ANION GAP: 10 (ref 5–15)
BUN: 75 mg/dL — AB (ref 8–23)
CHLORIDE: 94 mmol/L — AB (ref 98–111)
CO2: 35 mmol/L — AB (ref 22–32)
Calcium: 9.6 mg/dL (ref 8.9–10.3)
Creatinine, Ser: 1.82 mg/dL — ABNORMAL HIGH (ref 0.44–1.00)
GFR calc Af Amer: 31 mL/min — ABNORMAL LOW (ref >60)
GFR, EST NON AFRICAN AMERICAN: 26 mL/min — AB (ref >60)
GLUCOSE: 234 mg/dL — AB (ref 70–99)
POTASSIUM: 4.2 mmol/L (ref 3.5–5.1)
Sodium: 139 mmol/L (ref 135–145)

## 2018-09-11 LAB — TYPE AND SCREEN
ABO/RH(D): A POS
Antibody Screen: NEGATIVE
Unit division: 0
Unit division: 0

## 2018-09-11 LAB — BPAM RBC
BLOOD PRODUCT EXPIRATION DATE: 201911022359
Blood Product Expiration Date: 201911022359
ISSUE DATE / TIME: 201910120743
UNIT TYPE AND RH: 6200
Unit Type and Rh: 6200

## 2018-09-11 LAB — GLUCOSE, CAPILLARY
GLUCOSE-CAPILLARY: 237 mg/dL — AB (ref 70–99)
Glucose-Capillary: 200 mg/dL — ABNORMAL HIGH (ref 70–99)
Glucose-Capillary: 206 mg/dL — ABNORMAL HIGH (ref 70–99)
Glucose-Capillary: 310 mg/dL — ABNORMAL HIGH (ref 70–99)

## 2018-09-11 LAB — CBC
HEMATOCRIT: 30 % — AB (ref 36.0–46.0)
Hemoglobin: 8.6 g/dL — ABNORMAL LOW (ref 12.0–15.0)
MCH: 28.4 pg (ref 26.0–34.0)
MCHC: 28.7 g/dL — AB (ref 30.0–36.0)
MCV: 99 fL (ref 80.0–100.0)
Platelets: 199 10*3/uL (ref 150–400)
RBC: 3.03 MIL/uL — AB (ref 3.87–5.11)
RDW: 18.1 % — ABNORMAL HIGH (ref 11.5–15.5)
WBC: 10.2 10*3/uL (ref 4.0–10.5)
nRBC: 0.2 % (ref 0.0–0.2)

## 2018-09-11 MED ORDER — PREDNISONE 50 MG PO TABS
50.0000 mg | ORAL_TABLET | Freq: Every day | ORAL | Status: DC
  Administered 2018-09-12 – 2018-09-14 (×3): 50 mg via ORAL
  Filled 2018-09-11 (×3): qty 1

## 2018-09-11 MED ORDER — TORSEMIDE 20 MG PO TABS
80.0000 mg | ORAL_TABLET | Freq: Two times a day (BID) | ORAL | Status: DC
  Administered 2018-09-11: 80 mg via ORAL
  Filled 2018-09-11: qty 4

## 2018-09-11 MED ORDER — METOLAZONE 2.5 MG PO TABS
2.5000 mg | ORAL_TABLET | Freq: Once | ORAL | Status: AC
  Administered 2018-09-11: 2.5 mg via ORAL
  Filled 2018-09-11: qty 1

## 2018-09-11 MED ORDER — FUROSEMIDE 10 MG/ML IJ SOLN
120.0000 mg | Freq: Two times a day (BID) | INTRAVENOUS | Status: DC
  Administered 2018-09-11 – 2018-09-12 (×2): 120 mg via INTRAVENOUS
  Filled 2018-09-11: qty 10
  Filled 2018-09-11: qty 12

## 2018-09-11 NOTE — NC FL2 (Signed)
[place] LEVEL OF CARE SCREENING TOOL     IDENTIFICATION  Patient Name: Paige Hardin Birthdate: Jun 02, 1946 Sex: female Admission Date (Current Location): 09/06/2018  [facility] and [place] Number:  Herbalist and Address:  [facility], [place] [address], [place], [place] [address]      Provider Number: [id number]  Attending Physician Name and Address:  Florencia Reasons, MD  Relative Name and Phone Number:       Current Level of Care: Hospital Recommended Level of Care: [place] Prior Approval Number:    Date Approved/Denied:   PASRR Number: [id number]  Discharge Plan: SNF    Current Diagnoses: Patient Active Problem List   Diagnosis Date Noted  . CHF exacerbation ([place]) 09/07/2018  . CKD (chronic kidney disease), stage IV ([place]) 09/07/2018  . GI bleeding 05/21/2018  . Anemia of chronic renal failure, stage 3 (moderate) ([place]) 03/27/2017  . CHF (congestive heart failure) ([place]) 10/17/2016  . UTI (urinary tract infection) 10/17/2016  . Solitary pulmonary nodule 12/23/2015  . Type 2 diabetes mellitus with hyperglycemia ([place]) 11/16/2015  . Hyperlipidemia 11/16/2015  . Excessive daytime sleepiness 09/08/2015  . Iron deficiency anemia 03/29/2014  . Prolonged QT interval 03/01/2014  . Altered mental status 02/24/2014  . HTN (hypertension) 01/21/2014  . Chronic anticoagulation 01/21/2014  . Pulmonary HTN- pa 53 mmHg Echo 01/15/14 01/21/2014  . Morbid obesity- BMI 50 01/21/2014  . Poorly controlled diabetes mellitus ([place]) 01/21/2014  . Chronic renal disease, stage III ([place]) 01/21/2014  . Chronic diastolic heart failure ([place]) 01/14/2014  . CHEST PAIN 09/26/2010  . HYPERLIPIDEMIA-MIXED 05/14/2009  . HOARSENESS 05/14/2009  . CAROTID BRUIT 05/14/2009  . COPD with emphysema  05/05/2009  . PAF (paroxysmal atrial fibrillation) s/p multiple DCCVs; on Amiodarone 05/05/2009    Orientation RESPIRATION BLADDER Height & Weight      Self, Time, Situation, Place  O2(Nasal Canula 5 L) Incontinent, External catheter Weight: 299 lb 11.2 oz (135.9 kg) Height:  5\' 5"  (165.1 cm)  BEHAVIORAL SYMPTOMS/MOOD NEUROLOGICAL BOWEL NUTRITION STATUS  (None) (None) Incontinent Diet(Heart healthy/carb modified. Fluid restriction 1200 mL.)  AMBULATORY STATUS COMMUNICATION OF NEEDS Skin   Limited Assist Verbally Bruising, Other (Comment)(Deep tissue injury on right and left buttocks: Foam prn.)                       Personal Care Assistance Level of Assistance              Functional Limitations Info  Sight, Hearing, Speech Sight Info: Adequate Hearing Info: Adequate Speech Info: Adequate    SPECIAL CARE FACTORS FREQUENCY  PT (By licensed PT)     PT Frequency: 5 x week              Contractures Contractures Info: Not present    Additional Factors Info  Code Status, Allergies, Isolation Precautions Code Status Info: DNR Allergies Info: Ace Inhibitors, Statins, Clindamycin.     Isolation Precautions Info: Contact     Current Medications (09/11/2018):  This is the current hospital active medication list Current Facility-Administered Medications  Medication Dose Route Frequency Provider Last Rate Last Dose  . 0.9 %  sodium chloride infusion  250 mL Intravenous PRN Norval Morton, MD   Stopped at 09/07/18 2000  . acetaminophen (TYLENOL) tablet 650 mg  650 mg Oral Q4H PRN Fuller Plan A, MD   650 mg at 09/11/18 0915  . albuterol (PROVENTIL) (2.5 MG/3ML) 0.083% nebulizer  solution 2.5 mg  2.5 mg Nebulization Q6H PRN Fuller Plan A, MD   2.5 mg at 09/09/18 0408  . allopurinol (ZYLOPRIM) tablet 100 mg  100 mg Oral Daily Fuller Plan A, MD   100 mg at 09/11/18 0916  . amiodarone (PACERONE) tablet 200 mg  200 mg Oral Daily Tamala Julian, Rondell A, MD   200 mg at 09/11/18 0916  . apixaban (ELIQUIS) tablet 5 mg  5 mg Oral BID Fuller Plan A, MD   5 mg at 09/11/18 0916  . arformoterol (BROVANA) nebulizer solution 15  mcg  15 mcg Nebulization BID Fuller Plan A, MD   15 mcg at 09/11/18 0803  . budesonide (PULMICORT) nebulizer solution 0.25 mg  0.25 mg Nebulization BID Rai, Ripudeep K, MD   0.25 mg at 09/11/18 0803  . calcium-vitamin D (OSCAL WITH D) 500-200 MG-UNIT per tablet 1 tablet  1 tablet Oral Q breakfast Fuller Plan A, MD   1 tablet at 09/11/18 0757  . ceFEPIme (MAXIPIME) 1 g in sodium chloride 0.9 % 100 mL IVPB  1 g Intravenous Q24H Norval Morton, MD   Stopped at 09/11/18 0700  . Chlorhexidine Gluconate Cloth 2 % PADS 6 each  6 each Topical Q0600 Rai, Vernelle Emerald, MD   6 each at 09/09/18 [phone]  . ezetimibe-simvastatin (VYTORIN) 10-40 MG per tablet 1 tablet  1 tablet Oral QHS Smith, Rondell A, MD   1 tablet at 09/10/18 2235  . furosemide (LASIX) 120 mg in dextrose 5 % 50 mL IVPB  120 mg Intravenous BID Larey Dresser, MD      . guaiFENesin [facility]) 12 hr tablet 600 mg  600 mg Oral BID Fuller Plan A, MD   600 mg at 09/11/18 0916  . guaiFENesin-dextromethorphan (ROBITUSSIN DM) 100-10 MG/5ML syrup 5 mL  5 mL Oral Q4H PRN Rai, Ripudeep K, MD   5 mL at 09/10/18 1658  . insulin aspart (novoLOG) injection 0-15 Units  0-15 Units Subcutaneous TID WC Rai, Ripudeep K, MD   2 Units at 09/11/18 1215  . insulin NPH Human (HUMULIN N,NOVOLIN N) injection 25 Units  25 Units Subcutaneous BID AC & HS Rai, Ripudeep K, MD   25 Units at 09/11/18 0757  . ipratropium-albuterol (DUONEB) 0.5-2.5 (3) MG/3ML nebulizer solution 3 mL  3 mL Nebulization Once Duffy Bruce, MD      . ipratropium-albuterol (DUONEB) 0.5-2.5 (3) MG/3ML nebulizer solution 3 mL  3 mL Nebulization QID Rai, Ripudeep K, MD   3 mL at 09/11/18 1200  . levothyroxine (SYNTHROID, LEVOTHROID) tablet 25 mcg  25 mcg Oral QAC breakfast Fuller Plan A, MD   25 mcg at 09/11/18 0757  . methylPREDNISolone sodium succinate (SOLU-MEDROL) 125 mg/2 mL injection 60 mg  60 mg Intravenous Q12H Rai, Ripudeep K, MD   60 mg at 09/11/18 0758  . metolazone (ZAROXOLYN)  tablet 2.5 mg  2.5 mg Oral Once Larey Dresser, MD      . metoprolol tartrate (LOPRESSOR) tablet 50 mg  50 mg Oral BID Fuller Plan A, MD   50 mg at 09/11/18 0915  . mupirocin ointment (BACTROBAN) 2 % 1 application  1 application Nasal BID Rai, Vernelle Emerald, MD   1 application at 99/24/26 0916  . potassium chloride SA (K-DUR,KLOR-CON) CR tablet 20 mEq  20 mEq Oral TID Fuller Plan A, MD   20 mEq at 09/11/18 0917  . promethazine (PHENERGAN) injection 12.5 mg  12.5 mg Intravenous Q6H PRN Rai, Vernelle Emerald, MD  12.5 mg at 09/08/18 1451  . sodium chloride flush (NS) 0.9 % injection 3 mL  3 mL Intravenous Q12H Smith, Rondell A, MD   3 mL at 09/11/18 1216  . sodium chloride flush (NS) 0.9 % injection 3 mL  3 mL Intravenous PRN Fuller Plan A, MD      . vancomycin (VANCOCIN) 1,500 mg in sodium chloride 0.9 % 500 mL IVPB  1,500 mg Intravenous Q24H Rai, Ripudeep K, MD 250 mL/hr at 09/11/18 1219 1,500 mg at 09/11/18 1219     Discharge Medications: Please see discharge summary for a list of discharge medications.  Relevant Imaging Results:  Relevant Lab Results:   Additional Information SS#: [id number]. A resident at [facility].  Candie Chroman, LCSW

## 2018-09-11 NOTE — Progress Notes (Signed)
Resumed care at 15:20 pm.

## 2018-09-11 NOTE — Progress Notes (Addendum)
Advanced Heart Failure Rounding Note  PCP-Cardiologist: No primary care provider on file.   Subjective:    Yesterday she continued to diurese with IV lasix. Weight unchanged. Creatinine trending up.   Feeling a little better. Continues to have intermittent shortness of breath. She was able to walk to the bathroom.    Objective:   Weight Range: 135.9 kg Body mass index is 49.87 kg/m.   Vital Signs:   Temp:  [97.9 F (36.6 C)-98.6 F (37 C)] 97.9 F (36.6 C) (10/16 0428) Pulse Rate:  [72-91] 73 (10/16 0428) Resp:  [20-22] 20 (10/16 0428) BP: (119-154)/(55-69) 154/69 (10/16 0428) SpO2:  [75 %-92 %] 90 % (10/16 0428) Weight:  [135.9 kg] 135.9 kg (10/16 0428) Last BM Date: 09/09/18  Weight change: Filed Weights   09/09/18 0605 09/10/18 0351 09/11/18 0428  Weight: (!) 137 kg 136 kg 135.9 kg    Intake/Output:   Intake/Output Summary (Last 24 hours) at 09/11/2018 0736 Last data filed at 09/11/2018 0441 Gross per 24 hour  Intake 1563 ml  Output 500 ml  Net 1063 ml      Physical Exam    General: appear chronically ill. No resp difficulty HEENT: normal Neck: supple.JVP 6-7. Carotids 2+ bilat; no bruits. No lymphadenopathy or thryomegaly appreciated. Cor: PMI nondisplaced. Regular rate & rhythm. No rubs, gallops or murmurs. Lungs: EW on Right. On 6 liters [place].  Abdomen: obese, soft, nontender, nondistended. No hepatosplenomegaly. No bruits or masses. Good bowel sounds. Extremities: no cyanosis, clubbing, rash, R and LLE trace-1+edema Neuro: alert & orientedx3, cranial nerves grossly intact. moves all 4 extremities w/o difficulty. Affect pleasant    Telemetry   SR 70-80s personally reviewed.   EKG   n/a  Labs    CBC Recent Labs    09/09/18 0507 09/10/18 0508  WBC 14.4* 7.3  HGB 8.3* 8.2*  HCT 29.8* 28.0*  MCV 99.3 96.6  PLT 197 409   Basic Metabolic Panel Recent Labs    09/10/18 0508 09/11/18 0357  NA 140 139  K 4.2 4.2  CL 92* 94*  CO2  36* 35*  GLUCOSE 205* 234*  BUN 67* 75*  CREATININE 1.61* 1.82*  CALCIUM 9.8 9.6   Liver Function Tests No results for input(s): AST, ALT, ALKPHOS, BILITOT, PROT, ALBUMIN in the last 72 hours. No results for input(s): LIPASE, AMYLASE in the last 72 hours. Cardiac Enzymes No results for input(s): CKTOTAL, CKMB, CKMBINDEX, TROPONINI in the last 72 hours.  BNP: BNP (last 3 results) Recent Labs    09/07/18 0035  BNP 1,037.1*    ProBNP (last 3 results) No results for input(s): PROBNP in the last 8760 hours.   D-Dimer No results for input(s): DDIMER in the last 72 hours. Hemoglobin A1C No results for input(s): HGBA1C in the last 72 hours. Fasting Lipid Panel No results for input(s): CHOL, HDL, LDLCALC, TRIG, CHOLHDL, LDLDIRECT in the last 72 hours. Thyroid Function Tests No results for input(s): TSH, T4TOTAL, T3FREE, THYROIDAB in the last 72 hours.  Invalid input(s): FREET3  Other results:   Imaging    No results found.   Medications:     Scheduled Medications: . allopurinol  100 mg Oral Daily  . amiodarone  200 mg Oral Daily  . apixaban  5 mg Oral BID  . arformoterol  15 mcg Nebulization BID  . budesonide (PULMICORT) nebulizer solution  0.25 mg Nebulization BID  . calcium-vitamin D  1 tablet Oral Q breakfast  . Chlorhexidine Gluconate Cloth  6 each Topical Q0600  . ezetimibe-simvastatin  1 tablet Oral QHS  . guaiFENesin  600 mg Oral BID  . insulin aspart  0-15 Units Subcutaneous TID WC  . insulin NPH Human  25 Units Subcutaneous BID AC & HS  . ipratropium-albuterol  3 mL Nebulization Once  . ipratropium-albuterol  3 mL Nebulization QID  . levothyroxine  25 mcg Oral QAC breakfast  . methylPREDNISolone (SOLU-MEDROL) injection  60 mg Intravenous Q12H  . metoprolol tartrate  50 mg Oral BID  . mupirocin ointment  1 application Nasal BID  . potassium chloride SA  20 mEq Oral TID  . sodium chloride flush  3 mL Intravenous Q12H    Infusions: . sodium chloride  Stopped (09/07/18 2000)  . ceFEPime (MAXIPIME) IV 1 g (09/11/18 0453)  . furosemide Stopped (09/10/18 1745)  . vancomycin Stopped (09/10/18 1500)    PRN Medications: sodium chloride, acetaminophen, albuterol, guaiFENesin-dextromethorphan, promethazine, sodium chloride flush    Patient Profile  Paige Hardin is a 72 year old with history of morbid obesity, chronic respiratory failure with supplemental oxygen, COPD, PAF, pulmonary nodules, DM, hypothroidism, anemia, and diastolic heart failure. She has been chair bound for a number of years.   Assessment/Plan   1. Dyspnea Multifactorial- COPD, A/C Diastolic HF, and possible HCAP.  Preliminary sputum culture- with moderate gram + cocci.  - WBC on admit 23>14>8.3 . Afebrile  - On admit started on vancomycin and cefepime.  - On nebulizers and steroids.  - She is on 2L [place] at baseline for COPD.   2. A/C Diastolic Heart Failure -ECHO 05/2018 EF 55-60% Grade II DD BNP on admit elevated -->1037.  Stop IV lasix. Switch to torsemide mg twice a day + weekly metolazone.  - Check BMET in am.   3. CKD stage IV Creatinine on admit 2.2  Creatinine baseline 2-2/3  Creatinine trending up.   4. Anemia Followed by Dr Marin Olp in the community. Anemia  secondary to iron deficiency, renal insufficiency, and anticoagulation. In August 2 received 2URBCs.  She last received Iron 07/2018.  - No obvious source of bleeding.  - no CBC today   5. Morbid Obesity  Body mass index is 49.87 kg/m.   6. PAF Remains in NSR.  -Continue amio 200 mg daily.  - Has been followed by Dr Lamonte Sakai. Last seen 09/2017. No evidence of amio toxicity.  - TSH and LFTs ok.  -On eliquis.  - No CBC today . Check in am.   7. Hypothyroidism  -TSH 1.9. On synthyroid  8. Pulmonary Nodule  -On CT of chest 11/2017 . Followed by Dr Lamonte Sakai.   9. Aortic stenosis: Moderate on 7/19 echo.   10. DNR  Length of Stay: 4  Amy Clegg, NP  09/11/2018, 7:36 AM  Advanced Heart  Failure Team Pager [phone] (M-F; [place])  Please contact [facility] for night-coverage after hours (4p -7a ) and weekends on [email or web address]  Patient seen with NP.  Agree with the above note but with the below changes.   She has been admitted with PNA + AECOPD.  Also with acute on chronic diastolic CHF.  She remains in NSR on amiodarone.  She is still short of breath and orthopneic.   On exam, JVP 12+ cm.  Decreased BS bilaterally.  Regular S1S2, 2/6 SEM RUSB.  1+ chronic ankle edema.   She will continue vancomycin/cefepime + Solumedrol IV per primary service.   I think she is still significantly volume overloaded.  She got  po torsemide this morning.  Will switch back to Lasix 120 mg IV bid for at least a couple more doses, will give a dose of metolazone before next dose.   Creatinine higher today but is lower than her initial creatinine. Will need to follow this closely.   Continue amiodarone to maintain NSR.   Aortic stenosis was moderate on 7/19 echo.   Loralie Champagne 09/11/2018 11:02 AM

## 2018-09-11 NOTE — Clinical Social Work Note (Signed)
Patient and her niece are agreeable to SNF placement. [facility] is first preference because she has been there before. Left message for admissions coordinator to notify. CSW encouraged them to choose a second preference SNF because [facility] has not had beds recently.  Dayton Scrape, Echo

## 2018-09-11 NOTE — Plan of Care (Signed)
  Problem: Activity: Goal: Risk for activity intolerance will decrease Outcome: Progressing   Problem: Elimination: Goal: Will not experience complications related to bowel motility Outcome: Progressing   Problem: Safety: Goal: Ability to remain free from injury will improve Outcome: Progressing   Problem: Activity: Goal: Capacity to carry out activities will improve Outcome: Progressing   

## 2018-09-11 NOTE — Clinical Social Work Placement (Signed)
   CLINICAL SOCIAL WORK PLACEMENT  NOTE  Date:  09/11/2018  Patient Details  Name: Paige Hardin MRN: [id number] Date of Birth: 12-05-1945  Clinical Social Work is seeking post-discharge placement for this patient at the [place] level of care (*CSW will initial, date and re-position this form in  chart as items are completed):  Yes   Patient/family provided with [place] Work Department's list of facilities offering this level of care within the geographic area requested by the patient (or if unable, by the patient's family).  Yes   Patient/family informed of their freedom to choose among providers that offer the needed level of care, that participate in Medicare, Medicaid or managed care program needed by the patient, have an available bed and are willing to accept the patient.  Yes   Patient/family informed of [facility]'s ownership interest in [facility] and [facility], as well as of the fact that they are under no obligation to receive care at these facilities.  PASRR submitted to EDS on 09/11/18     PASRR number received on       Existing PASRR number confirmed on 09/11/18     FL2 transmitted to all facilities in geographic area requested by pt/family on 09/11/18     FL2 transmitted to all facilities within larger geographic area on       Patient informed that his/her managed care company has contracts with or will negotiate with certain facilities, including the following:            Patient/family informed of bed offers received.  Patient chooses bed at       Physician recommends and patient chooses bed at      Patient to be transferred to   on  .  Patient to be transferred to facility by       Patient family notified on   of transfer.  Name of family member notified:        PHYSICIAN Please sign FL2     Additional Comment:    _______________________________________________ Candie Chroman, LCSW 09/11/2018, 12:57  PM

## 2018-09-11 NOTE — Consult Note (Signed)
   [facility] CM Inpatient Consult   09/11/2018  Paige Hardin 02-25-1946 [id number]  Patient screened for high risk for unplanned readmissions with Medicare.  Chart review reveals patient is for Skilled nursing facility before returning to her Assisted Living facility. No current [facility] Care Management needs noted.   For questions contact:   Natividad Brood, RN BSN [facility] Liaison  [phone] business mobile phone Toll free office [phone]

## 2018-09-11 NOTE — Evaluation (Signed)
Physical Therapy Evaluation Patient Details Name: Paige Hardin MRN: [id number] DOB: [birth year] Today's Date: 09/11/2018   History of Present Illness  Pt is a 72 y.o. female admitted from ALF on 09/06/18 with worsening SOB; worked up for COPD, HF, and possible HCAP. PMH includes obesity, CKD IV, DM, COPD, chronic respiratory failure (4L home O2).    Clinical Impression  Pt presents with an overall decrease in functional mobility secondary to above. PTA, pt resides at [place]; ambulatory short household distances with rollator, relies on w/c for community distances. Today, pt requires minA to stand and take steps with RW. Discussed recommendation for SNF-level therapies prior to return to ALF; pt and niece in agreement with this. Will follow acutely to address established goals.     Follow Up Recommendations SNF;Supervision for mobility/OOB    Equipment Recommendations  None recommended by PT    Recommendations for Other Services       Precautions / Restrictions Precautions Precautions: Fall Precaution Comments: Significant DOE Restrictions Weight Bearing Restrictions: No      Mobility  Bed Mobility Overal bed mobility: Needs Assistance Bed Mobility: Sit to Supine       Sit to supine: Mod assist   General bed mobility comments: Received sitting EOB. ModA to assist BLEs back into bed (reports sleeps in recliner at home)  Transfers Overall transfer level: Needs assistance Equipment used: Rolling walker (2 wheeled) Transfers: Sit to/from Stand Sit to Stand: Min assist         General transfer comment: Stood from bed and BSC with RW and minA to assist trunk elevation; heavy reliance on momentum and UE support  Ambulation/Gait Ambulation/Gait assistance: Min guard Gait Distance (Feet): 5 Feet Assistive device: Rolling walker (2 wheeled) Gait Pattern/deviations: Step-to pattern;Wide base of support;Trunk flexed Gait velocity: Decreased Gait velocity  interpretation: <1.31 ft/sec, indicative of household ambulator General Gait Details: Amb from bed<>BSC with RW and close min guard for balance; DOE 3/4 requiring seated rest. SpO2 91% on 5L O2 [place]  Stairs            Wheelchair Mobility    Modified Rankin (Stroke Patients Only)       Balance Overall balance assessment: Needs assistance   Sitting balance-Leahy Scale: Fair       Standing balance-Leahy Scale: Poor Standing balance comment: Reliant on UE support                             Pertinent Vitals/Pain Pain Assessment: No/denies pain    Home Living Family/patient expects to be discharged to:: Assisted living([facility])               Home Equipment: Shower seat;Bedside commode;Wheelchair - [employer] - 4 wheels      Prior Function Level of Independence: Needs assistance   Gait / Transfers Assistance Needed: Amb short apartment distances with rollator; pushed in wheelchair by niece to car for appointments. Eats most meals in apartment  ADL's / Homemaking Assistance Needed: Indep with ADLs; gets in shower 1x/wk, or performs bird baths at sink, indep with set-up. Staff assists with medications        Hand Dominance        Extremity/Trunk Assessment   Upper Extremity Assessment Upper Extremity Assessment: Generalized weakness    Lower Extremity Assessment Lower Extremity Assessment: Generalized weakness       Communication   Communication: HOH  Cognition Arousal/Alertness: Awake/alert Behavior During Therapy: WFL for  tasks assessed/performed Overall Cognitive Status: Within Functional Limits for tasks assessed                                        General Comments General comments (skin integrity, edema, etc.): Niece present during session    Exercises     Assessment/Plan    PT Assessment Patient needs continued PT services  PT Problem List Decreased strength;Decreased activity  tolerance;Decreased balance;Decreased mobility;Cardiopulmonary status limiting activity       PT Treatment Interventions DME instruction;Gait training;Functional mobility training;Therapeutic activities;Therapeutic exercise;Balance training;Patient/family education    PT Goals (Current goals can be found in the Care Plan section)  Acute Rehab PT Goals Patient Stated Goal: Agreeable to SNF prior to return to ALF to regain strength/independence PT Goal Formulation: With patient/family Time For Goal Achievement: 09/25/18 Potential to Achieve Goals: Good    Frequency Min 2X/week   Barriers to discharge Decreased caregiver support      Co-evaluation               AM-PAC PT "6 Clicks" Daily Activity  Outcome Measure Difficulty turning over in bed (including adjusting bedclothes, sheets and blankets)?: Unable Difficulty moving from lying on back to sitting on the side of the bed? : Unable Difficulty sitting down on and standing up from a chair with arms (e.g., wheelchair, bedside commode, etc,.)?: Unable Help needed moving to and from a bed to chair (including a wheelchair)?: A Little Help needed walking in hospital room?: A Little Help needed climbing 3-5 steps with a railing? : Total 6 Click Score: 10    End of Session Equipment Utilized During Treatment: Gait belt Activity Tolerance: Patient tolerated treatment well;Patient limited by fatigue Patient left: in bed;with call bell/phone within reach;with bed alarm set;with family/visitor present Nurse Communication: Mobility status PT Visit Diagnosis: Other abnormalities of gait and mobility (R26.89)    Time: 1025-1057 PT Time Calculation (min) (ACUTE ONLY): 32 min   Charges:   PT Evaluation $PT Eval Moderate Complexity: 1 Mod PT Treatments $Therapeutic Activity: 8-22 mins       Mabeline Caras, PT, DPT Acute Rehabilitation Services  Pager [phone] Office [place] 09/11/2018, 11:41  AM

## 2018-09-11 NOTE — Progress Notes (Signed)
PROGRESS NOTE  Paige Hardin YYQ:[id number] DOB: Apr 21, 1946 DOA: 09/06/2018 PCP: Reymundo Poll, MD  HPI/Recap of past 24 hours:  She is edematous, she is on 4liter oxygen at rest No fever, denies chest pain  Assessment/Plan: Principal Problem:   CHF exacerbation ([place]) Active Problems:   COPD with emphysema    PAF (paroxysmal atrial fibrillation) s/p multiple DCCVs; on Amiodarone   Morbid obesity- BMI 50   Poorly controlled diabetes mellitus (HCC)   Prolonged QT interval   Iron deficiency anemia   CKD (chronic kidney disease), stage IV (HCC)  Acute on chronic respiratory failure with hypoxia secondary to acute on chronic diastolic CHF, symptomatic anemia, HCAP - O2 sats 89% on 5 L on presentation -at baseline she is on 4liters  Acute on chronic diastolic CHF exacerbation 2D echo 06/11/2018 showed EF of 55 to 60% with grade 2 diastolic dysfunction Remain volume overloaded Cardiology heart failure team adjusting meds  Moderate Aortic stenosis  Last echo  in 7/19 She is followed by cardiology  Paroxysmal atrial fibrillation -Currently normal sinus rhythm, continue amiodarone -Continue Eliquis  RBBB chronic  Sepsis secondary to suspected HCAP, COPD exacerbation, presented on admission -Influenza negative, respiratory virus panel negative -Blood cultures negative so far, -Sputum cultures with moderate rothia mucilaginosa, rare staph aureus -Urine strep antigen negative.   -Continue IV vancomycin and cefepime -continue nebs, taper steroids -wbc 23 on presentation, wbc normalized since 10/15  UTI -UA positive for UTI, many bacteria, WBCs 21-50, positive leukocytes, nitrite -Urine culture showed multiple species, on IV cefepime  Insulin dependent dm2 Lat a1c may not be acute in the setting of anemia and abnormal renal function Continue adjust insulin while on steroids   CKDIII Cr range from 1.6 to 2.2 at baseline Renal dosing meds  Anemia of chronic  disease Followed by Dr. Marin Olp, has received as needed transfusions, last received darbepoetin infusion on 9/24 S/p prbc x1 units on 10/14 hgb has been above 8 after prbc transfusion   Morbid obesity Body mass index is 49.87 kg/m.  Reports does not have OSA by sleep study two yrs ago  FTT: She is from ALF, will need SNF, patient agrees   Code Status: DNR  Family Communication: patient   Disposition Plan: SNF in 1-2 days pending clinical improvement, need cardiology clearance   Consultants:  Cardiology/heart failure team  Procedures:  none  Antibiotics:  vanc/cefepime   Objective: BP 124/62   Pulse 68   Temp 98 F (36.7 C) (Oral)   Resp 20   Ht 5\' 5"  (1.651 m)   Wt 135.9 kg   SpO2 94%   BMI 49.87 kg/m   Intake/Output Summary (Last 24 hours) at 09/11/2018 1317 Last data filed at 09/11/2018 0919 Gross per 24 hour  Intake 1730 ml  Output 500 ml  Net 1230 ml   Filed Weights   09/09/18 0605 09/10/18 0351 09/11/18 0428  Weight: (!) 137 kg 136 kg 135.9 kg    Exam: Patient is examined daily including today on 09/11/2018, exams remain the same as of yesterday except that has changed    General:  Frail, chronically ill appearing, NAD  Cardiovascular: RRR  Respiratory: diminished at basis  Abdomen: Soft/ND/NT, positive BS  Musculoskeletal: bilateral lower extremity pitting Edema, chronic venous stasis skin changes  Neuro: alert, oriented   Data Reviewed: Basic Metabolic Panel: Recent Labs  Lab 09/06/18 2238 09/08/18 0442 09/09/18 0507 09/10/18 0508 09/11/18 0357  NA 133* 135 139 140 139  K 4.4 4.1  4.0 4.2 4.2  CL 89* 88* 93* 92* 94*  CO2 34* 34* 35* 36* 35*  GLUCOSE 195* 231* 150* 205* 234*  BUN 54* 65* 69* 67* 75*  CREATININE 2.19* 2.20* 1.99* 1.61* 1.82*  CALCIUM 8.9 9.1 9.3 9.8 9.6   Liver Function Tests: Recent Labs  Lab 09/06/18 2238  AST 22  ALT 13  ALKPHOS 50  BILITOT 1.1  PROT 6.5  ALBUMIN 3.0*   No results for  input(s): LIPASE, AMYLASE in the last 168 hours. No results for input(s): AMMONIA in the last 168 hours. CBC: Recent Labs  Lab 09/06/18 2238 09/07/18 0709 09/07/18 1257 09/08/18 0442 09/09/18 0507 09/10/18 0508 09/11/18 0357  WBC 23.1* 19.8*  --  13.8* 14.4* 7.3 10.2  NEUTROABS 21.9* 19.4*  --   --   --   --   --   HGB 7.1* 7.0* 8.0* 7.9* 8.3* 8.2* 8.6*  HCT 24.9* 23.8* 27.0* 26.3* 29.8* 28.0* 30.0*  MCV 98.8 97.5  --  95.6 99.3 96.6 99.0  PLT 189 171  --  189 197 185 199   Cardiac Enzymes:   Recent Labs  Lab 09/07/18 0035 09/07/18 0251 09/07/18 0709 09/07/18 1257  TROPONINI <0.03 <0.03 <0.03 0.03*   BNP (last 3 results) Recent Labs    09/07/18 0035  BNP 1,037.1*    ProBNP (last 3 results) No results for input(s): PROBNP in the last 8760 hours.  CBG: Recent Labs  Lab 09/10/18 1151 09/10/18 1619 09/10/18 2148 09/11/18 0735 09/11/18 1130  GLUCAP 291* 232* 222* 200* 206*    Recent Results (from the past 240 hour(s))  Culture, blood (routine x 2)     Status: None (Preliminary result)   Collection Time: 09/07/18  2:38 AM  Result Value Ref Range Status   Specimen Description BLOOD RIGHT HAND  Final   Special Requests   Final    BOTTLES DRAWN AEROBIC AND ANAEROBIC Blood Culture adequate volume   Culture   Final    NO GROWTH 4 DAYS Performed at [facility], [place] [address], [place], [place] [address]    Report Status PENDING  Incomplete  Culture, blood (routine x 2)     Status: None (Preliminary result)   Collection Time: 09/07/18  2:55 AM  Result Value Ref Range Status   Specimen Description BLOOD LEFT FOREARM  Final   Special Requests   Final    BOTTLES DRAWN AEROBIC AND ANAEROBIC Blood Culture adequate volume   Culture   Final    NO GROWTH 4 DAYS Performed at [facility], [place] [address], [place], [place] [address]    Report Status PENDING  Incomplete  Respiratory Panel by PCR     Status: None   Collection Time: 09/07/18  3:05 AM   Result Value Ref Range Status   Adenovirus NOT DETECTED NOT DETECTED Final   Coronavirus 229E NOT DETECTED NOT DETECTED Final   Coronavirus HKU1 NOT DETECTED NOT DETECTED Final   Coronavirus NL63 NOT DETECTED NOT DETECTED Final   Coronavirus OC43 NOT DETECTED NOT DETECTED Final   Metapneumovirus NOT DETECTED NOT DETECTED Final   Rhinovirus / Enterovirus NOT DETECTED NOT DETECTED Final   Influenza A NOT DETECTED NOT DETECTED Final   Influenza A H1 NOT DETECTED NOT DETECTED Final   Influenza A H1 2009 NOT DETECTED NOT DETECTED Final   Influenza A H3 NOT DETECTED NOT DETECTED Final   Influenza B NOT DETECTED NOT DETECTED Final   Parainfluenza Virus 1 NOT  DETECTED NOT DETECTED Final   Parainfluenza Virus 2 NOT DETECTED NOT DETECTED Final   Parainfluenza Virus 3 NOT DETECTED NOT DETECTED Final   Parainfluenza Virus 4 NOT DETECTED NOT DETECTED Final   Respiratory Syncytial Virus NOT DETECTED NOT DETECTED Final   Bordetella pertussis NOT DETECTED NOT DETECTED Final   Chlamydophila pneumoniae NOT DETECTED NOT DETECTED Final   Mycoplasma pneumoniae NOT DETECTED NOT DETECTED Final    Comment: Performed at [facility], [place] [address], [place], [place] [address]  MRSA PCR Screening     Status: Abnormal   Collection Time: 09/07/18  6:25 AM  Result Value Ref Range Status   MRSA by PCR POSITIVE (A) NEGATIVE Final    Comment:        The GeneXpert MRSA Assay (FDA approved for NASAL specimens only), is one component of a comprehensive MRSA colonization surveillance program. It is not intended to diagnose MRSA infection nor to guide or monitor treatment for MRSA infections. RESULT CALLED TO, READ BACK BY AND VERIFIED WITH: RN Z IDI [id number] [phone] MLM Performed at [facility], [place] [address] [place], [place] [address]   Urine Culture     Status: Abnormal   Collection Time: 09/07/18  2:29 PM  Result Value Ref Range Status   Specimen Description URINE, RANDOM  Final   Special  Requests   Final    NONE Performed at [facility], [place] [address], [place], [place] [address]    Culture MULTIPLE SPECIES PRESENT, SUGGEST RECOLLECTION (A)  Final   Report Status 09/08/2018 FINAL  Final  Culture, sputum-assessment     Status: None   Collection Time: 09/07/18  9:39 PM  Result Value Ref Range Status   Specimen Description SPUTUM  Final   Special Requests NONE  Final   Sputum evaluation   Final    THIS SPECIMEN IS ACCEPTABLE FOR SPUTUM CULTURE Performed at [facility], [place] [address] [place], [place] [address]    Report Status 09/08/2018 FINAL  Final  Culture, respiratory     Status: None (Preliminary result)   Collection Time: 09/07/18  9:39 PM  Result Value Ref Range Status   Specimen Description SPUTUM  Final   Special Requests NONE Reflexed from [id number]  Final   Gram Stain   Final    MODERATE WBC PRESENT, PREDOMINANTLY PMN MODERATE GRAM POSITIVE COCCI    Culture   Final    MODERATE ROTHIA MUCILAGINOSA Standardized susceptibility testing for this organism is not available. RARE STAPHYLOCOCCUS AUREUS REPEATING SUSCEPTIBILITIES Performed at [facility], [place] [address], [place],  [address]    Report Status PENDING  Incomplete     Studies: No results found.  Scheduled Meds: . allopurinol  100 mg Oral Daily  . amiodarone  200 mg Oral Daily  . apixaban  5 mg Oral BID  . arformoterol  15 mcg Nebulization BID  . budesonide (PULMICORT) nebulizer solution  0.25 mg Nebulization BID  . calcium-vitamin D  1 tablet Oral Q breakfast  . Chlorhexidine Gluconate Cloth  6 each Topical Q0600  . ezetimibe-simvastatin  1 tablet Oral QHS  . guaiFENesin  600 mg Oral BID  . insulin aspart  0-15 Units Subcutaneous TID WC  . insulin NPH Human  25 Units Subcutaneous BID AC & HS  . ipratropium-albuterol  3 mL Nebulization Once  . ipratropium-albuterol  3 mL Nebulization QID  . levothyroxine  25 mcg Oral QAC breakfast  . methylPREDNISolone  (  SOLU-MEDROL) injection  60 mg Intravenous Q12H  . metolazone  2.5 mg Oral Once  . metoprolol tartrate  50 mg Oral BID  . mupirocin ointment  1 application Nasal BID  . potassium chloride SA  20 mEq Oral TID  . sodium chloride flush  3 mL Intravenous Q12H    Continuous Infusions: . sodium chloride Stopped (09/07/18 2000)  . ceFEPime (MAXIPIME) IV Stopped (09/11/18 0700)  . furosemide    . vancomycin 1,500 mg (09/11/18 1219)     Time spent: 65mins I have personally reviewed and interpreted on  09/11/2018 daily labs, tele strips, imagings as discussed above under date review session and assessment and plans.  I reviewed all nursing notes, pharmacy notes, consultant notes,  vitals, pertinent old records  I have discussed plan of care as described above with RN , patient on 09/11/2018   Florencia Reasons MD, PhD  [facility] Pager [phone]. If 7PM-7AM, please contact night-coverage at [email or web address], password [email or web address] 09/11/2018, 1:17 PM  LOS: 4 days

## 2018-09-11 NOTE — Progress Notes (Signed)
Inpatient Diabetes Program Recommendations  AACE/ADA: New Consensus Statement on Inpatient Glycemic Control (2015)  Target Ranges:  Prepandial:   less than 140 mg/dL      Peak postprandial:   less than 180 mg/dL (1-2 hours)      Critically ill patients:  140 - 180 mg/dL   Lab Results  Component Value Date   GLUCAP 200 (H) 09/11/2018   HGBA1C 4.9 09/08/2018    Review of Glycemic ControlResults for JOANANN, MIES (MRN [id number]) as of 09/11/2018 10:56  Ref. Range 09/10/2018 07:15 09/10/2018 11:51 09/10/2018 16:19 09/10/2018 21:48 09/11/2018 07:35  Glucose-Capillary Latest Ref Range: 70 - 99 mg/dL 188 (H) 291 (H) 232 (H) 222 (H) 200 (H)    Diabetes history: DM 2 Outpatient Diabetes medications: Novolog 12-18 units tid with meals, NPH 20 units in the AM and 30 units in the evening Current orders for Inpatient glycemic control:  Novolog moderate tid with meals, NPH 25 units bid Inpatient Diabetes Program Recommendations:   Please consider adding Novolog 4 units tid with meals (hold if patient eats less than 50%).   Thanks,  Adah Perl, RN, BC-ADM Inpatient Diabetes Coordinator Pager [phone] (8a-5p)

## 2018-09-12 LAB — CULTURE, BLOOD (ROUTINE X 2)
CULTURE: NO GROWTH
Culture: NO GROWTH
Special Requests: ADEQUATE
Special Requests: ADEQUATE

## 2018-09-12 LAB — GLUCOSE, CAPILLARY
GLUCOSE-CAPILLARY: 200 mg/dL — AB (ref 70–99)
GLUCOSE-CAPILLARY: 155 mg/dL — AB (ref 70–99)
GLUCOSE-CAPILLARY: 162 mg/dL — AB (ref 70–99)
Glucose-Capillary: 161 mg/dL — ABNORMAL HIGH (ref 70–99)
Glucose-Capillary: 176 mg/dL — ABNORMAL HIGH (ref 70–99)

## 2018-09-12 LAB — BASIC METABOLIC PANEL
Anion gap: 12 (ref 5–15)
BUN: 85 mg/dL — ABNORMAL HIGH (ref 8–23)
CALCIUM: 9.4 mg/dL (ref 8.9–10.3)
CO2: 33 mmol/L — AB (ref 22–32)
CREATININE: 1.94 mg/dL — AB (ref 0.44–1.00)
Chloride: 93 mmol/L — ABNORMAL LOW (ref 98–111)
GFR calc Af Amer: 28 mL/min — ABNORMAL LOW (ref >60)
GFR calc non Af Amer: 24 mL/min — ABNORMAL LOW (ref >60)
Glucose, Bld: 218 mg/dL — ABNORMAL HIGH (ref 70–99)
Potassium: 4.2 mmol/L (ref 3.5–5.1)
Sodium: 138 mmol/L (ref 135–145)

## 2018-09-12 LAB — MAGNESIUM: Magnesium: 2 mg/dL (ref 1.7–2.4)

## 2018-09-12 MED ORDER — METOLAZONE 2.5 MG PO TABS: 2.5000 mg | ORAL_TABLET | Freq: Every day | ORAL | Status: DC

## 2018-09-12 MED ORDER — TORSEMIDE 20 MG PO TABS
80.0000 mg | ORAL_TABLET | Freq: Two times a day (BID) | ORAL | Status: DC
  Administered 2018-09-12 – 2018-09-14 (×4): 80 mg via ORAL
  Filled 2018-09-12 (×4): qty 4

## 2018-09-12 MED ORDER — ALUM & MAG HYDROXIDE-SIMETH 200-200-20 MG/5ML PO SUSP
15.0000 mL | Freq: Four times a day (QID) | ORAL | Status: DC | PRN
  Administered 2018-09-12: 15 mL via ORAL
  Filled 2018-09-12: qty 30

## 2018-09-12 MED ORDER — PANTOPRAZOLE SODIUM 20 MG PO TBEC
20.0000 mg | DELAYED_RELEASE_TABLET | Freq: Every day | ORAL | Status: DC
  Administered 2018-09-12 – 2018-09-14 (×3): 20 mg via ORAL
  Filled 2018-09-12 (×3): qty 1

## 2018-09-12 NOTE — Plan of Care (Signed)
  Problem: Education: Goal: Knowledge of General Education information will improve Description: Including pain rating scale, medication(s)/side effects and non-pharmacologic comfort measures Outcome: Progressing   Problem: Clinical Measurements: Goal: Respiratory complications will improve Outcome: Progressing   

## 2018-09-12 NOTE — Progress Notes (Signed)
PROGRESS NOTE  Paige Hardin HXT:[id number] DOB: June 24, 1946 DOA: 09/06/2018 PCP: Reymundo Poll, MD  HPI/Recap of past 24 hours:  500cc urine documented last 24hrs she is on 4liter oxygen at rest No fever, denies chest pain C/o acid reflux and feeling foot stuck in her esophagus Reports she coughed a lot after dinner yesterday, she vomited this am, she does not want to eat this evening  Assessment/Plan: Principal Problem:   CHF exacerbation ([place]) Active Problems:   COPD with emphysema    PAF (paroxysmal atrial fibrillation) s/p multiple DCCVs; on Amiodarone   Morbid obesity- BMI 50   Poorly controlled diabetes mellitus (HCC)   Prolonged QT interval   Iron deficiency anemia   CKD (chronic kidney disease), stage IV (HCC)  Acute on chronic respiratory failure with hypoxia secondary to acute on chronic diastolic CHF, symptomatic anemia, HCAP - O2 sats 89% on 5 L on presentation -at baseline she is on 4liters  Acute on chronic diastolic CHF exacerbation 2D echo 06/11/2018 showed EF of 55 to 60% with grade 2 diastolic dysfunction Remain volume overloaded Cardiology heart failure team adjusting meds  Moderate Aortic stenosis  Last echo  in 7/19 She is followed by cardiology  Paroxysmal atrial fibrillation -Currently normal sinus rhythm, continue amiodarone -Continue Eliquis  RBBB chronic  Sepsis secondary to suspected HCAP, COPD exacerbation, presented on admission -Influenza negative, respiratory virus panel negative -Blood cultures negative so far, -Sputum cultures with moderate rothia mucilaginosa, rare staph aureus -Urine strep antigen negative.   -Continue IV vancomycin and cefepime -continue nebs, taper steroids -wbc 23 on presentation, wbc normalized since 10/15  UTI -UA positive for UTI, many bacteria, WBCs 21-50, positive leukocytes, nitrite -Urine culture showed multiple species, on IV cefepime  Insulin dependent dm2 Lat a1c may not be acute in the setting  of anemia and abnormal renal function Continue adjust insulin while on steroids   CKDIII Cr range from 1.6 to 2.2 at baseline Renal dosing meds  Anemia of chronic disease Followed by Dr. Marin Olp, has received as needed transfusions, last received darbepoetin infusion on 9/24 S/p prbc x1 units on 10/14 hgb has been above 8 after prbc transfusion   Morbid obesity Body mass index is 50.65 kg/m.  Reports does not have OSA by sleep study two yrs ago  FTT: She is from ALF, will need SNF, patient agrees   Dysphagia,  Denies choking , will get dg esophagus study in am  Code Status: DNR  Family Communication: patient   Disposition Plan: SNF in 1-2 days pending clinical improvement, need cardiology clearance   Consultants:  Cardiology/heart failure team  Procedures:  none  Antibiotics:  vanc/cefepime   Objective: BP (!) 152/64   Pulse 74   Temp 98.4 F (36.9 C) (Oral)   Resp 20   Ht 5\' 5"  (1.651 m)   Wt (!) 138.1 kg Comment: scale C  SpO2 94%   BMI 50.65 kg/m   Intake/Output Summary (Last 24 hours) at 09/12/2018 1222 Last data filed at 09/12/2018 1211 Gross per 24 hour  Intake 1323 ml  Output 1000 ml  Net 323 ml   Filed Weights   09/11/18 0428 09/12/18 0557 09/12/18 0803  Weight: 135.9 kg (!) 138.3 kg (!) 138.1 kg    Exam: Patient is examined daily including today on 09/12/2018, exams remain the same as of yesterday except that has changed    General:  Frail, chronically ill appearing, NAD  Cardiovascular: RRR  Respiratory: diminished at basis  Abdomen: Soft/ND/NT,  positive BS  Musculoskeletal: bilateral lower extremity pitting Edema, chronic venous stasis skin changes  Neuro: alert, oriented   Data Reviewed: Basic Metabolic Panel: Recent Labs  Lab 09/08/18 0442 09/09/18 0507 09/10/18 0508 09/11/18 0357 09/12/18 0505  NA 135 139 140 139 138  K 4.1 4.0 4.2 4.2 4.2  CL 88* 93* 92* 94* 93*  CO2 34* 35* 36* 35* 33*  GLUCOSE 231* 150*  205* 234* 218*  BUN 65* 69* 67* 75* 85*  CREATININE 2.20* 1.99* 1.61* 1.82* 1.94*  CALCIUM 9.1 9.3 9.8 9.6 9.4  MG  --   --   --   --  2.0   Liver Function Tests: Recent Labs  Lab 09/06/18 2238  AST 22  ALT 13  ALKPHOS 50  BILITOT 1.1  PROT 6.5  ALBUMIN 3.0*   No results for input(s): LIPASE, AMYLASE in the last 168 hours. No results for input(s): AMMONIA in the last 168 hours. CBC: Recent Labs  Lab 09/06/18 2238 09/07/18 0709 09/07/18 1257 09/08/18 0442 09/09/18 0507 09/10/18 0508 09/11/18 0357  WBC 23.1* 19.8*  --  13.8* 14.4* 7.3 10.2  NEUTROABS 21.9* 19.4*  --   --   --   --   --   HGB 7.1* 7.0* 8.0* 7.9* 8.3* 8.2* 8.6*  HCT 24.9* 23.8* 27.0* 26.3* 29.8* 28.0* 30.0*  MCV 98.8 97.5  --  95.6 99.3 96.6 99.0  PLT 189 171  --  189 197 185 199   Cardiac Enzymes:   Recent Labs  Lab 09/07/18 0035 09/07/18 0251 09/07/18 0709 09/07/18 1257  TROPONINI <0.03 <0.03 <0.03 0.03*   BNP (last 3 results) Recent Labs    09/07/18 0035  BNP 1,037.1*    ProBNP (last 3 results) No results for input(s): PROBNP in the last 8760 hours.  CBG: Recent Labs  Lab 09/11/18 1130 09/11/18 1634 09/11/18 2054 09/12/18 0810 09/12/18 1210  GLUCAP 206* 237* 310* 200* 161*    Recent Results (from the past 240 hour(s))  Culture, blood (routine x 2)     Status: None   Collection Time: 09/07/18  2:38 AM  Result Value Ref Range Status   Specimen Description BLOOD RIGHT HAND  Final   Special Requests   Final    BOTTLES DRAWN AEROBIC AND ANAEROBIC Blood Culture adequate volume   Culture   Final    NO GROWTH 5 DAYS Performed at [facility], [place] [address] [place], [place] [address]    Report Status 09/12/2018 FINAL  Final  Culture, blood (routine x 2)     Status: None   Collection Time: 09/07/18  2:55 AM  Result Value Ref Range Status   Specimen Description BLOOD LEFT FOREARM  Final   Special Requests   Final    BOTTLES DRAWN AEROBIC AND ANAEROBIC Blood Culture  adequate volume   Culture   Final    NO GROWTH 5 DAYS Performed at [facility], [place] [address] [place], [place] [address]    Report Status 09/12/2018 FINAL  Final  Respiratory Panel by PCR     Status: None   Collection Time: 09/07/18  3:05 AM  Result Value Ref Range Status   Adenovirus NOT DETECTED NOT DETECTED Final   Coronavirus 229E NOT DETECTED NOT DETECTED Final   Coronavirus HKU1 NOT DETECTED NOT DETECTED Final   Coronavirus NL63 NOT DETECTED NOT DETECTED Final   Coronavirus OC43 NOT DETECTED NOT DETECTED Final   Metapneumovirus NOT DETECTED NOT DETECTED Final  Rhinovirus / Enterovirus NOT DETECTED NOT DETECTED Final   Influenza A NOT DETECTED NOT DETECTED Final   Influenza A H1 NOT DETECTED NOT DETECTED Final   Influenza A H1 2009 NOT DETECTED NOT DETECTED Final   Influenza A H3 NOT DETECTED NOT DETECTED Final   Influenza B NOT DETECTED NOT DETECTED Final   Parainfluenza Virus 1 NOT DETECTED NOT DETECTED Final   Parainfluenza Virus 2 NOT DETECTED NOT DETECTED Final   Parainfluenza Virus 3 NOT DETECTED NOT DETECTED Final   Parainfluenza Virus 4 NOT DETECTED NOT DETECTED Final   Respiratory Syncytial Virus NOT DETECTED NOT DETECTED Final   Bordetella pertussis NOT DETECTED NOT DETECTED Final   Chlamydophila pneumoniae NOT DETECTED NOT DETECTED Final   Mycoplasma pneumoniae NOT DETECTED NOT DETECTED Final    Comment: Performed at [facility], [place] [address], [place], [place] [address]  MRSA PCR Screening     Status: Abnormal   Collection Time: 09/07/18  6:25 AM  Result Value Ref Range Status   MRSA by PCR POSITIVE (A) NEGATIVE Final    Comment:        The GeneXpert MRSA Assay (FDA approved for NASAL specimens only), is one component of a comprehensive MRSA colonization surveillance program. It is not intended to diagnose MRSA infection nor to guide or monitor treatment for MRSA infections. RESULT CALLED TO, READ BACK BY AND VERIFIED WITH: RN Z IDI  [id number] [phone] MLM Performed at [facility], [place] [address] [place], [place] [address]   Urine Culture     Status: Abnormal   Collection Time: 09/07/18  2:29 PM  Result Value Ref Range Status   Specimen Description URINE, RANDOM  Final   Special Requests   Final    NONE Performed at [facility], [place] [address], [place], [place] [address]    Culture MULTIPLE SPECIES PRESENT, SUGGEST RECOLLECTION (A)  Final   Report Status 09/08/2018 FINAL  Final  Culture, sputum-assessment     Status: None   Collection Time: 09/07/18  9:39 PM  Result Value Ref Range Status   Specimen Description SPUTUM  Final   Special Requests NONE  Final   Sputum evaluation   Final    THIS SPECIMEN IS ACCEPTABLE FOR SPUTUM CULTURE Performed at [facility], [place] [address] [place], [place] [address]    Report Status 09/08/2018 FINAL  Final  Culture, respiratory     Status: None (Preliminary result)   Collection Time: 09/07/18  9:39 PM  Result Value Ref Range Status   Specimen Description SPUTUM  Final   Special Requests NONE Reflexed from [id number]  Final   Gram Stain   Final    MODERATE WBC PRESENT, PREDOMINANTLY PMN MODERATE GRAM POSITIVE COCCI    Culture   Final    MODERATE ROTHIA MUCILAGINOSA Standardized susceptibility testing for this organism is not available. RARE STAPHYLOCOCCUS AUREUS REPEATING SUSCEPTIBILITIES Performed at [facility], [place] [address] [place], [place] [address]    Report Status PENDING  Incomplete     Studies: No results found.  Scheduled Meds: . allopurinol  100 mg Oral Daily  . amiodarone  200 mg Oral Daily  . apixaban  5 mg Oral BID  . arformoterol  15 mcg Nebulization BID  . budesonide (PULMICORT) nebulizer solution  0.25 mg Nebulization BID  . calcium-vitamin D  1 tablet Oral Q breakfast  . Chlorhexidine Gluconate Cloth  6 each Topical Q0600  . ezetimibe-simvastatin  1  tablet Oral QHS  . guaiFENesin  600 mg Oral BID  . insulin aspart  0-15  Units Subcutaneous TID WC  . insulin NPH Human  25 Units Subcutaneous BID AC & HS  . ipratropium-albuterol  3 mL Nebulization Once  . ipratropium-albuterol  3 mL Nebulization QID  . levothyroxine  25 mcg Oral QAC breakfast  . metoprolol tartrate  50 mg Oral BID  . mupirocin ointment  1 application Nasal BID  . potassium chloride SA  20 mEq Oral TID  . predniSONE  50 mg Oral Q breakfast  . sodium chloride flush  3 mL Intravenous Q12H  . torsemide  80 mg Oral BID    Continuous Infusions: . sodium chloride 250 mL (09/11/18 1800)  . ceFEPime (MAXIPIME) IV 1 g (09/12/18 0329)  . vancomycin Stopped (09/11/18 1420)     Time spent: 70mins I have personally reviewed and interpreted on  09/12/2018 daily labs, tele strips, imagings as discussed above under date review session and assessment and plans.  I reviewed all nursing notes, pharmacy notes, consultant notes,  vitals, pertinent old records  I have discussed plan of care as described above with RN , patient on 09/12/2018   Florencia Reasons MD, PhD  [facility] Pager [phone]. If 7PM-7AM, please contact night-coverage at [email or web address], password [facility] 09/12/2018, 12:22 PM  LOS: 5 days

## 2018-09-12 NOTE — Progress Notes (Signed)
Physical Therapy Treatment Patient Details Name: Paige Hardin MRN: [id number] DOB: Oct 17, 1946 Today's Date: 09/12/2018    History of Present Illness Pt is a 72 y.o. female admitted from ALF on 09/06/18 with worsening SOB; worked up for COPD, HF, and possible HCAP. PMH includes obesity, CKD IV, DM, COPD, chronic respiratory failure (4L home O2).    PT Comments    Pt progressing slowly toward PT goals, signifiicant dyspnea with minimal exertion persists; continue PT POC    Follow Up Recommendations  SNF;Supervision for mobility/OOB     Equipment Recommendations  None recommended by PT    Recommendations for Other Services       Precautions / Restrictions Precautions Precautions: Fall Precaution Comments: Significant DOE Restrictions Weight Bearing Restrictions: No    Mobility  Bed Mobility Overal bed mobility: Needs Assistance Bed Mobility: Sit to Supine       Sit to supine: Min assist;Mod assist   General bed mobility comments: received sitting EOB, assist for Bil LEs onto bed, pt with incr effort, requiring more assist with LLE than right  Transfers Overall transfer level: Needs assistance Equipment used: Rolling walker (2 wheeled) Transfers: Sit to/from Stand Sit to Stand: Min assist;Min guard         General transfer comment: stood from [facility], uses momentum; cues for hand placement, heay reliance on UEs once in standing  Ambulation/Gait Ambulation/Gait assistance: Min guard Gait Distance (Feet): 5 Feet Assistive device: Rolling walker (2 wheeled) Gait Pattern/deviations: Step-to pattern;Wide base of support;Trunk flexed Gait velocity: Decreased   General Gait Details: Amb from BSC<>bed  with RW and close min guard for balance; DOE 3/4 requiring seated rest. SpO2 95% on 4L O2 [name]   Stairs             Wheelchair Mobility    Modified Rankin (Stroke Patients Only)       Balance     Sitting balance-Leahy Scale: Fair       Standing  balance-Leahy Scale: Poor Standing balance comment: Reliant on UE support                            Cognition Arousal/Alertness: Awake/alert Behavior During Therapy: WFL for tasks assessed/performed Overall Cognitive Status: Within Functional Limits for tasks assessed                                        Exercises      General Comments        Pertinent Vitals/Pain Pain Assessment: No/denies pain    Home Living                      Prior Function            PT Goals (current goals can now be found in the care plan section) Acute Rehab PT Goals Patient Stated Goal: Agreeable to SNF prior to return to ALF to regain strength/independence PT Goal Formulation: With patient/family Time For Goal Achievement: 09/25/18 Potential to Achieve Goals: Good Progress towards PT goals: Progressing toward goals    Frequency    Min 2X/week      PT Plan Current plan remains appropriate    Co-evaluation              AM-PAC PT "6 Clicks" Daily Activity  Outcome Measure  Difficulty turning over in bed (including  adjusting bedclothes, sheets and blankets)?: Unable Difficulty moving from lying on back to sitting on the side of the bed? : Unable Difficulty sitting down on and standing up from a chair with arms (e.g., wheelchair, bedside commode, etc,.)?: Unable Help needed moving to and from a bed to chair (including a wheelchair)?: A Little Help needed walking in hospital room?: A Little Help needed climbing 3-5 steps with a railing? : Total 6 Click Score: 10    End of Session   Activity Tolerance: Patient limited by fatigue;Patient tolerated treatment well Patient left: in bed;with call bell/phone within reach(bedrails x4 on pt request) Nurse Communication: Mobility status PT Visit Diagnosis: Other abnormalities of gait and mobility (R26.89)     Time: 3009-2330 PT Time Calculation (min) (ACUTE ONLY): 15 min  Charges:   $Therapeutic Activity: 8-22 mins                     Kenyon Ana, PT  Pager: [phone] Acute Rehab Dept [facility]): [phone]   09/12/2018    [facility] 09/12/2018, 4:35 PM

## 2018-09-12 NOTE — Plan of Care (Signed)
  Problem: Education: Goal: Knowledge of General Education information will improve Description Including pain rating scale, medication(s)/side effects and non-pharmacologic comfort measures Outcome: Progressing   Problem: Health Behavior/Discharge Planning: Goal: Ability to manage health-related needs will improve Outcome: Progressing   Problem: Clinical Measurements: Goal: Diagnostic test results will improve Outcome: Progressing   Problem: Clinical Measurements: Goal: Respiratory complications will improve Outcome: Progressing   Problem: Clinical Measurements: Goal: Cardiovascular complication will be avoided Outcome: Progressing   Problem: Education: Goal: Ability to verbalize understanding of medication therapies will improve Outcome: Progressing

## 2018-09-12 NOTE — Progress Notes (Signed)
Patient complains of nausea and feeling like food is stick in her esophagus. MD paged.

## 2018-09-12 NOTE — Progress Notes (Addendum)
Advanced Heart Failure Rounding Note  PCP-Cardiologist: No primary care provider on file.   Subjective:    Yesterday she continued to diurese with IV lasix + metolazone. Weight today is 301.4 pounds.  Sluggish urine output.    Feels terrible today. Complaining of nausea and fatigue. SOB with exertion.     Objective:   Weight Range: (!) 138.3 kg Body mass index is 50.75 kg/m.   Vital Signs:   Temp:  [98 F (36.7 C)-98.4 F (36.9 C)] 98.4 F (36.9 C) (10/17 0557) Pulse Rate:  [68-75] 71 (10/17 0557) Resp:  [20] 20 (10/16 2014) BP: (124-152)/(56-64) 152/64 (10/17 0557) SpO2:  [90 %-95 %] 90 % (10/17 0557) Weight:  [138.3 kg] 138.3 kg (10/17 0557) Last BM Date: 09/11/18  Weight change: Filed Weights   09/10/18 0351 09/11/18 0428 09/12/18 0557  Weight: 136 kg 135.9 kg (!) 138.3 kg    Intake/Output:   Intake/Output Summary (Last 24 hours) at 09/12/2018 0753 Last data filed at 09/12/2018 0400 Gross per 24 hour  Intake 1563 ml  Output 500 ml  Net 1063 ml      Physical Exam   General:  Appears chronically ill. SOB standing.  HEENT: normal Neck: supple. JVP ~10 Carotids 2+ bilat; no bruits. No lymphadenopathy or thryomegaly appreciated. Cor: PMI nondisplaced. Regular rate & rhythm. No rubs, gallops or murmurs. Lungs: EW on 6 liters [place].  Abdomen: obese, soft, nontender, nondistended. No hepatosplenomegaly. No bruits or masses. Good bowel sounds. Extremities: no cyanosis, clubbing, rash, R and LLE 1-2+ edema Neuro: alert & orientedx3, cranial nerves grossly intact. moves all 4 extremities w/o difficulty. Affect pleasant   Telemetry   SR 70-80s personally reviewed.   EKG   n/a  Labs    CBC Recent Labs    09/10/18 0508 09/11/18 0357  WBC 7.3 10.2  HGB 8.2* 8.6*  HCT 28.0* 30.0*  MCV 96.6 99.0  PLT 185 220   Basic Metabolic Panel Recent Labs    09/10/18 0508 09/11/18 0357  NA 140 139  K 4.2 4.2  CL 92* 94*  CO2 36* 35*  GLUCOSE 205* 234*    BUN 67* 75*  CREATININE 1.61* 1.82*  CALCIUM 9.8 9.6   Liver Function Tests No results for input(s): AST, ALT, ALKPHOS, BILITOT, PROT, ALBUMIN in the last 72 hours. No results for input(s): LIPASE, AMYLASE in the last 72 hours. Cardiac Enzymes No results for input(s): CKTOTAL, CKMB, CKMBINDEX, TROPONINI in the last 72 hours.  BNP: BNP (last 3 results) Recent Labs    09/07/18 0035  BNP 1,037.1*    ProBNP (last 3 results) No results for input(s): PROBNP in the last 8760 hours.   D-Dimer No results for input(s): DDIMER in the last 72 hours. Hemoglobin A1C No results for input(s): HGBA1C in the last 72 hours. Fasting Lipid Panel No results for input(s): CHOL, HDL, LDLCALC, TRIG, CHOLHDL, LDLDIRECT in the last 72 hours. Thyroid Function Tests No results for input(s): TSH, T4TOTAL, T3FREE, THYROIDAB in the last 72 hours.  Invalid input(s): FREET3  Other results:   Imaging    No results found.   Medications:     Scheduled Medications: . allopurinol  100 mg Oral Daily  . amiodarone  200 mg Oral Daily  . apixaban  5 mg Oral BID  . arformoterol  15 mcg Nebulization BID  . budesonide (PULMICORT) nebulizer solution  0.25 mg Nebulization BID  . calcium-vitamin D  1 tablet Oral Q breakfast  . Chlorhexidine Gluconate Cloth  6 each Topical Q0600  . ezetimibe-simvastatin  1 tablet Oral QHS  . guaiFENesin  600 mg Oral BID  . insulin aspart  0-15 Units Subcutaneous TID WC  . insulin NPH Human  25 Units Subcutaneous BID AC & HS  . ipratropium-albuterol  3 mL Nebulization Once  . ipratropium-albuterol  3 mL Nebulization QID  . levothyroxine  25 mcg Oral QAC breakfast  . metoprolol tartrate  50 mg Oral BID  . mupirocin ointment  1 application Nasal BID  . potassium chloride SA  20 mEq Oral TID  . predniSONE  50 mg Oral Q breakfast  . sodium chloride flush  3 mL Intravenous Q12H    Infusions: . sodium chloride 250 mL (09/11/18 1800)  . ceFEPime (MAXIPIME) IV 1 g  (09/12/18 0329)  . furosemide 120 mg (09/11/18 1802)  . vancomycin Stopped (09/11/18 1420)    PRN Medications: sodium chloride, acetaminophen, albuterol, guaiFENesin-dextromethorphan, promethazine, sodium chloride flush    Patient Profile  Paige Hardin is a 72 year old with history of morbid obesity, chronic respiratory failure with supplemental oxygen, COPD, PAF, pulmonary nodules, DM, hypothroidism, anemia, and diastolic heart failure. She has been chair bound for a number of years.   Assessment/Plan   1. Dyspnea Multifactorial- COPD, A/C Diastolic HF, and possible HCAP.  Preliminary sputum culture- with moderate gram + cocci.  - WBC on admit 23>14>8.6 - No CBC today.  Afebrile  - On admit started on vancomycin and cefepime.  - On nebulizers and steroids.  - She is on 2L [place] at baseline for COPD.   2. A/C Diastolic Heart Failure -ECHO 05/2018 EF 55-60% Grade II DD BNP on admit elevated -->1037.  Not making much progress. Stop IV lasix and metolazone. Start torsemide 80 mg twice a day.   - I suggested unna boots but she does want to add.    3. CKD stage IV Creatinine on admit 2.2  Creatinine baseline 2.3  - Creatinine 1.9    4. Anemia Followed by Dr Marin Olp in the community. Anemia  secondary to iron deficiency, renal insufficiency, and anticoagulation. In August 2 received 2URBCs.  She last received Iron 07/2018.  - No obvious source of bleeding.  - no CBC today   5. Morbid Obesity  Body mass index is 50.75 kg/m.   6. PAF Remains in NSR -Continue amio 200 mg daily.  - Has been followed by Dr Lamonte Sakai. Last seen 09/2017. No evidence of amio toxicity.  - TSH and LFTs ok.  -On eliquis.  -CBC in am  7. Hypothyroidism  -TSH 1.9. On synthyroid  8. Pulmonary Nodule  -On CT of chest 11/2017 . Followed by Dr Lamonte Sakai.   9. Aortic stenosis: Moderate on 7/19 echo.   10. DNR  Discussed with Dr Aundra Dubin. Switching to po diuretics today.  Length of Stay: 5  Amy Clegg, NP    09/12/2018, 7:53 AM  Advanced Heart Failure Team Pager [phone] (M-F; 7a - 4p)  Please contact [facility] for night-coverage after hours (4p -7a ) and weekends on [email or web address]  Agree with the above NP note.   She has been admitted with PNA + AECOPD. Also with acute on chronic diastolic CHF. She remains in NSR on amiodarone. Diuresis has been sluggish, now creatinine and BUN rising.   - At this point, would transition over to torsemide 80 mg bid.   Continue antibiotics for  HCAP, UTI per primary service.   She will need SNF.   Loralie Champagne 09/12/2018

## 2018-09-12 NOTE — Clinical Social Work Note (Signed)
Patient has a bed at [facility] today if stable. Paged MD to notify.  Dayton Scrape, [place]

## 2018-09-13 ENCOUNTER — Inpatient Hospital Stay (HOSPITAL_COMMUNITY): Payer: Medicare Other

## 2018-09-13 LAB — BASIC METABOLIC PANEL
ANION GAP: 11 (ref 5–15)
BUN: 83 mg/dL — ABNORMAL HIGH (ref 8–23)
CHLORIDE: 91 mmol/L — AB (ref 98–111)
CO2: 35 mmol/L — ABNORMAL HIGH (ref 22–32)
CREATININE: 1.86 mg/dL — AB (ref 0.44–1.00)
Calcium: 9.8 mg/dL (ref 8.9–10.3)
GFR calc non Af Amer: 26 mL/min — ABNORMAL LOW (ref >60)
GFR, EST AFRICAN AMERICAN: 30 mL/min — AB (ref >60)
Glucose, Bld: 134 mg/dL — ABNORMAL HIGH (ref 70–99)
Potassium: 4.3 mmol/L (ref 3.5–5.1)
SODIUM: 137 mmol/L (ref 135–145)

## 2018-09-13 LAB — VANCOMYCIN, TROUGH: Vancomycin Tr: 29 ug/mL (ref 15–20)

## 2018-09-13 LAB — CBC
HEMATOCRIT: 27.5 % — AB (ref 36.0–46.0)
Hemoglobin: 7.9 g/dL — ABNORMAL LOW (ref 12.0–15.0)
MCH: 27.9 pg (ref 26.0–34.0)
MCHC: 28.7 g/dL — ABNORMAL LOW (ref 30.0–36.0)
MCV: 97.2 fL (ref 80.0–100.0)
NRBC: 0.4 % — AB (ref 0.0–0.2)
Platelets: 235 10*3/uL (ref 150–400)
RBC: 2.83 MIL/uL — ABNORMAL LOW (ref 3.87–5.11)
RDW: 18.4 % — AB (ref 11.5–15.5)
WBC: 16.3 10*3/uL — AB (ref 4.0–10.5)

## 2018-09-13 LAB — GLUCOSE, CAPILLARY
GLUCOSE-CAPILLARY: 125 mg/dL — AB (ref 70–99)
GLUCOSE-CAPILLARY: 101 mg/dL — AB (ref 70–99)
Glucose-Capillary: 209 mg/dL — ABNORMAL HIGH (ref 70–99)
Glucose-Capillary: 286 mg/dL — ABNORMAL HIGH (ref 70–99)

## 2018-09-13 LAB — CULTURE, RESPIRATORY W GRAM STAIN

## 2018-09-13 LAB — CULTURE, RESPIRATORY

## 2018-09-13 MED ORDER — IPRATROPIUM-ALBUTEROL 0.5-2.5 (3) MG/3ML IN SOLN
3.0000 mL | Freq: Three times a day (TID) | RESPIRATORY_TRACT | Status: DC
  Administered 2018-09-13 – 2018-09-14 (×4): 3 mL via RESPIRATORY_TRACT
  Filled 2018-09-13 (×4): qty 3

## 2018-09-13 MED ORDER — AMOXICILLIN-POT CLAVULANATE 500-125 MG PO TABS
1.0000 | ORAL_TABLET | Freq: Two times a day (BID) | ORAL | Status: DC
  Administered 2018-09-13 – 2018-09-14 (×3): 500 mg via ORAL
  Filled 2018-09-13 (×3): qty 1

## 2018-09-13 NOTE — Clinical Social Work Note (Signed)
Per MD, likely discharge to SNF tomorrow. SNF aware and agreeable. Will need discharge summary and orders in early because the facility needs her in the building before 5:00 or they cannot take her.  Dayton Scrape, [place]

## 2018-09-13 NOTE — Progress Notes (Signed)
Patient ID: GLENDA SPELMAN, female   DOB: 03-Jul-1946, 72 y.o.   MRN: [id number]     Advanced Heart Failure Rounding Note  PCP-Cardiologist: No primary care provider on file.   Subjective:    She is breathing better today.  Still with some coughing.  Actually seems to have had better UOP with po torsemide than with IV Lasix and metolazone.  Weight down 4 lbs.  Creatinine stable at 1.86.    Objective:   Weight Range: (!) 136.5 kg Body mass index is 50.08 kg/m.   Vital Signs:   Temp:  [98.2 F (36.8 C)-98.5 F (36.9 C)] 98.3 F (36.8 C) (10/18 0657) Pulse Rate:  [65-74] 67 (10/18 1055) Resp:  [18-20] 18 (10/18 1055) BP: (122-153)/(55-86) 122/58 (10/18 1055) SpO2:  [89 %-98 %] 96 % (10/18 0837) FiO2 (%):  [36 %] 36 % (10/17 1517) Weight:  [136.5 kg] 136.5 kg (10/18 0657) Last BM Date: 09/12/18  Weight change: Filed Weights   09/12/18 0557 09/12/18 0803 09/13/18 0657  Weight: (!) 138.3 kg (!) 138.1 kg (!) 136.5 kg    Intake/Output:   Intake/Output Summary (Last 24 hours) at 09/13/2018 1102 Last data filed at 09/13/2018 0912 Gross per 24 hour  Intake 613.62 ml  Output 3800 ml  Net -3186.38 ml      Physical Exam    General: NAD Neck: JVP 10 cm, no thyromegaly or thyroid nodule.  Lungs: Mild crackles at bases bilaterally. CV: Nonpalpable PMI.  Heart regular S1/S2, no S3/S4, no murmur.  1+ chronic edema to knees bilaterally.   Abdomen: Soft, nontender, no hepatosplenomegaly, no distention.  Skin: Intact without lesions or rashes.  Neurologic: Alert and oriented x 3.  Psych: Normal affect. Extremities: No clubbing or cyanosis.  HEENT: Normal.    Telemetry   NSR 60s, personally reviewed.   EKG   n/a  Labs    CBC Recent Labs    09/11/18 0357 09/13/18 0416  WBC 10.2 16.3*  HGB 8.6* 7.9*  HCT 30.0* 27.5*  MCV 99.0 97.2  PLT 199 924   Basic Metabolic Panel Recent Labs    09/12/18 0505 09/13/18 0416  NA 138 137  K 4.2 4.3  CL 93* 91*  CO2 33* 35*    GLUCOSE 218* 134*  BUN 85* 83*  CREATININE 1.94* 1.86*  CALCIUM 9.4 9.8  MG 2.0  --    Liver Function Tests No results for input(s): AST, ALT, ALKPHOS, BILITOT, PROT, ALBUMIN in the last 72 hours. No results for input(s): LIPASE, AMYLASE in the last 72 hours. Cardiac Enzymes No results for input(s): CKTOTAL, CKMB, CKMBINDEX, TROPONINI in the last 72 hours.  BNP: BNP (last 3 results) Recent Labs    09/07/18 0035  BNP 1,037.1*    ProBNP (last 3 results) No results for input(s): PROBNP in the last 8760 hours.   D-Dimer No results for input(s): DDIMER in the last 72 hours. Hemoglobin A1C No results for input(s): HGBA1C in the last 72 hours. Fasting Lipid Panel No results for input(s): CHOL, HDL, LDLCALC, TRIG, CHOLHDL, LDLDIRECT in the last 72 hours. Thyroid Function Tests No results for input(s): TSH, T4TOTAL, T3FREE, THYROIDAB in the last 72 hours.  Invalid input(s): FREET3  Other results:   Imaging    Dg Esophagus  Result Date: 09/13/2018 CLINICAL DATA:  Nausea, vomiting, sensation of food getting stuck in the lower esophagus. EXAM: ESOPHOGRAM/BARIUM SWALLOW TECHNIQUE: Single contrast examination was performed using thin barium or water soluble. FLUOROSCOPY TIME:  Fluoroscopy Time:  1 minutes 36 seconds Number of Acquired Spot Images: 5 COMPARISON:  CT of the chest 12/16/2017 FINDINGS: The hypopharynx demonstrated no filling defects and was normal in contour. The esophagus was smooth in contour. There was no narrowing or stricture. There was no delay in the passage of a barium tablet into the stomach. There was no hiatal hernia. Decreased mobility of the esophagus with prominent tertiary contractions and delay of clearing of contrast. Spontaneous gastroesophageal reflux. A 13 mm barium tablet passed without difficulties into the stomach. IMPRESSION: Decreased mobility of the esophagus with prominent tertiary contractions. Spontaneous gastroesophageal reflux. No structural  abnormality seen. Electronically Signed   By: Fidela Salisbury M.D.   On: 09/13/2018 10:20     Medications:     Scheduled Medications: . allopurinol  100 mg Oral Daily  . amiodarone  200 mg Oral Daily  . apixaban  5 mg Oral BID  . arformoterol  15 mcg Nebulization BID  . budesonide (PULMICORT) nebulizer solution  0.25 mg Nebulization BID  . calcium-vitamin D  1 tablet Oral Q breakfast  . Chlorhexidine Gluconate Cloth  6 each Topical Q0600  . ezetimibe-simvastatin  1 tablet Oral QHS  . guaiFENesin  600 mg Oral BID  . insulin aspart  0-15 Units Subcutaneous TID WC  . insulin NPH Human  25 Units Subcutaneous BID AC & HS  . ipratropium-albuterol  3 mL Nebulization Once  . ipratropium-albuterol  3 mL Nebulization TID  . levothyroxine  25 mcg Oral QAC breakfast  . metoprolol tartrate  50 mg Oral BID  . pantoprazole  20 mg Oral Daily  . potassium chloride SA  20 mEq Oral TID  . predniSONE  50 mg Oral Q breakfast  . sodium chloride flush  3 mL Intravenous Q12H  . torsemide  80 mg Oral BID    Infusions: . sodium chloride 250 mL (09/13/18 0238)  . ceFEPime (MAXIPIME) IV 1 g (09/13/18 0238)  . vancomycin Stopped (09/12/18 1452)    PRN Medications: sodium chloride, acetaminophen, albuterol, alum & mag hydroxide-simeth, guaiFENesin-dextromethorphan, promethazine, sodium chloride flush    Patient Profile  Ms Hollon is a 72 year old with history of morbid obesity, chronic respiratory failure with supplemental oxygen, COPD, PAF, pulmonary nodules, DM, hypothroidism, anemia, and diastolic heart failure. She has been chair bound for a number of years.   Assessment/Plan   1. 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  Currently on vanco/cefepime, afebrile. Now transitioned to prednisone off Solumedrol.  - Per primary service, on 2 L [place] at baseline for COPD.  2. A/C Diastolic Heart Failure: ECHO 05/2018 EF 55-60% Grade II DD.  She has diuresed better with po torsemide than  with IV Lasix and metolazone.  Weight down 4 lbs.  Still some volume overload on exam but improving.  - Would continue torsemide 80 mg bid.  She can go to SNF on this.  3. CKD stage III-IV: Creatinine stable at 1.86.  4. Anemia: Followed by Dr Marin Olp in the community. Anemia secondary to iron deficiency, renal insufficiency, and anticoagulation. In August 2 received 2URBCs.  She last received Iron 07/2018.  - No obvious source of bleeding.  5. Morbid Obesity  Body mass index is 50.08 kg/m. 6. PAF: Remains in NSR - Continue amio 200 mg daily.  - Continue Eliquis, no overt GI bleeding.  7. Hypothyroidism: On synthyroid 8. Pulmonary Nodule: On CT of chest 11/2017 . Followed by Dr Lamonte Sakai.  9. Aortic stenosis: Moderate on 7/19 echo.  10.  DNR  She will need SNF.  May go today or over weekend.  Cardiac meds for home: torsemide 80 mg bid, KCl 40 qam/20 qpm, apixaban 5 mg bid, amiodarone 200 daily, metoprolol 50 bid, Vytorin.  Will arrange followup in CHF clinic.  Will see again Monday if she is not discharged.   Length of Stay: 6  Loralie Champagne, MD  09/13/2018, 11:02 AM  Advanced Heart Failure Team Pager [phone] (M-F; 7a - 4p)  Please contact [facility] for night-coverage after hours (4p -7a ) and weekends on [email or web address]

## 2018-09-13 NOTE — Progress Notes (Signed)
Vancomycin Tr 29. Pharmacy aware, Vancomycin d/c

## 2018-09-13 NOTE — Progress Notes (Signed)
PROGRESS NOTE  Paige Hardin ZDG:[id number] DOB: [birth year] DOA: 09/06/2018 PCP: Reymundo Poll, MD  HPI/Recap of past 24 hours:  She responded to torsemide, 3.2liter urine output last 24hrs, edema down she is on 4liter oxygen at rest No fever, denies chest pain C/o acid reflux and feeling foot stuck in her esophagus She might had an aspiration event , there is wheezing on right lung fields, wbc went up She is seen after returned from dg esophagus Niece at bedside  Assessment/Plan: Principal Problem:   CHF exacerbation ([place]) Active Problems:   COPD with emphysema    PAF (paroxysmal atrial fibrillation) s/p multiple DCCVs; on Amiodarone   Morbid obesity- BMI 50   Poorly controlled diabetes mellitus (HCC)   Prolonged QT interval   Iron deficiency anemia   CKD (chronic kidney disease), stage IV (HCC)  Acute on chronic respiratory failure with hypoxia secondary to acute on chronic diastolic CHF, symptomatic anemia, HCAP - O2 sats 89% on 5 L on presentation -at baseline she is on 4liters  Acute on chronic diastolic CHF exacerbation 2D echo 06/11/2018 showed EF of 55 to 60% with grade 2 diastolic dysfunction  volume is improving Cardiology heart failure team adjusting meds  Moderate Aortic stenosis  Last echo  in 7/19 She is followed by cardiology  Paroxysmal atrial fibrillation -Currently normal sinus rhythm, continue amiodarone -Continue Eliquis  RBBB chronic  Sepsis secondary to suspected HCAP, COPD exacerbation, presented on admission -Influenza negative, respiratory virus panel negative -Blood cultures negative so far, -Sputum cultures with moderate rothia mucilaginosa, rare staph aureus -Urine strep antigen negative.   -Continue IV vancomycin and cefepimex7 days as of 10/18, however, she might have an aspiration event , wbc went up, change abx to augmentin, getting repeat cxr -continue nebs, taper steroids  Dysphagia,  Denies choking ,  dg esophagus showed  "Decreased mobility of the esophagus with prominent tertiary Contractions. Spontaneous gastroesophageal reflux. No structural abnormality seen." PPI, diet modification, sitting up at 90 degree when eat  UTI -UA positive for UTI, many bacteria, WBCs 21-50, positive leukocytes, nitrite -Urine culture showed multiple species, on IV cefepime  Insulin dependent dm2 Lat a1c may not be acute in the setting of anemia and abnormal renal function Continue adjust insulin while on steroids   CKDIII Cr range from 1.6 to 2.2 at baseline Renal dosing meds  Anemia of chronic disease Followed by Dr. Marin Olp, has received as needed transfusions, last received darbepoetin infusion on 9/24 S/p prbc x1 units on 10/14 hgb has been above 8 after prbc transfusion   Morbid obesity Body mass index is 50.08 kg/m.  Reports does not have OSA by sleep study two yrs ago  FTT: She is from ALF, will need SNF, patient agrees     Code Status: DNR  Family Communication: patient and neice  Disposition Plan: SNF hopefully tomorrow, if wbc improving, cxr no acute changes, right sided wheezing Improves   Consultants:  Cardiology/heart failure team  Procedures:  none  Antibiotics:  Vanc/cefepime from admission to 10/18  augmentin from 10/18-   Objective: BP (!) 122/58 (BP Location: Left Arm)   Pulse 67   Temp 98.3 F (36.8 C) (Oral)   Resp 18   Ht 5\' 5"  (1.651 m)   Wt (!) 136.5 kg   SpO2 98%   BMI 50.08 kg/m   Intake/Output Summary (Last 24 hours) at 09/13/2018 1132 Last data filed at 09/13/2018 1100 Gross per 24 hour  Intake 733.62 ml  Output 3800 ml  Net -3066.38 ml   Filed Weights   09/12/18 0557 09/12/18 0803 09/13/18 0657  Weight: (!) 138.3 kg (!) 138.1 kg (!) 136.5 kg    Exam: Patient is examined daily including today on 09/13/2018, exams remain the same as of yesterday except that has changed    General:  Frail, chronically ill appearing, NAD  Cardiovascular:  RRR  Respiratory: right sided wheezing, no wheezing on left side  Abdomen: Soft/ND/NT, positive BS  Musculoskeletal: bilateral lower extremity pitting Edema is improving, chronic venous stasis skin changes  Neuro: alert, oriented   Data Reviewed: Basic Metabolic Panel: Recent Labs  Lab 09/09/18 0507 09/10/18 0508 09/11/18 0357 09/12/18 0505 09/13/18 0416  NA 139 140 139 138 137  K 4.0 4.2 4.2 4.2 4.3  CL 93* 92* 94* 93* 91*  CO2 35* 36* 35* 33* 35*  GLUCOSE 150* 205* 234* 218* 134*  BUN 69* 67* 75* 85* 83*  CREATININE 1.99* 1.61* 1.82* 1.94* 1.86*  CALCIUM 9.3 9.8 9.6 9.4 9.8  MG  --   --   --  2.0  --    Liver Function Tests: Recent Labs  Lab 09/06/18 2238  AST 22  ALT 13  ALKPHOS 50  BILITOT 1.1  PROT 6.5  ALBUMIN 3.0*   No results for input(s): LIPASE, AMYLASE in the last 168 hours. No results for input(s): AMMONIA in the last 168 hours. CBC: Recent Labs  Lab 09/06/18 2238 09/07/18 0709  09/08/18 0442 09/09/18 0507 09/10/18 0508 09/11/18 0357 09/13/18 0416  WBC 23.1* 19.8*  --  13.8* 14.4* 7.3 10.2 16.3*  NEUTROABS 21.9* 19.4*  --   --   --   --   --   --   HGB 7.1* 7.0*   < > 7.9* 8.3* 8.2* 8.6* 7.9*  HCT 24.9* 23.8*   < > 26.3* 29.8* 28.0* 30.0* 27.5*  MCV 98.8 97.5  --  95.6 99.3 96.6 99.0 97.2  PLT 189 171  --  189 197 185 199 235   < > = values in this interval not displayed.   Cardiac Enzymes:   Recent Labs  Lab 09/07/18 0035 09/07/18 0251 09/07/18 0709 09/07/18 1257  TROPONINI <0.03 <0.03 <0.03 0.03*   BNP (last 3 results) Recent Labs    09/07/18 0035  BNP 1,037.1*    ProBNP (last 3 results) No results for input(s): PROBNP in the last 8760 hours.  CBG: Recent Labs  Lab 09/12/18 1210 09/12/18 1645 09/12/18 2201 09/12/18 2229 09/13/18 0751  GLUCAP 161* 176* 155* 162* 125*    Recent Results (from the past 240 hour(s))  Culture, blood (routine x 2)     Status: None   Collection Time: 09/07/18  2:38 AM  Result Value Ref  Range Status   Specimen Description BLOOD RIGHT HAND  Final   Special Requests   Final    BOTTLES DRAWN AEROBIC AND ANAEROBIC Blood Culture adequate volume   Culture   Final    NO GROWTH 5 DAYS Performed at [facility], [place] [address], [place], [place] [address]    Report Status 09/12/2018 FINAL  Final  Culture, blood (routine x 2)     Status: None   Collection Time: 09/07/18  2:55 AM  Result Value Ref Range Status   Specimen Description BLOOD LEFT FOREARM  Final   Special Requests   Final    BOTTLES DRAWN AEROBIC AND ANAEROBIC Blood Culture adequate volume   Culture   Final    NO GROWTH  5 DAYS Performed at [facility], [place] [address], [place], [place] [address]    Report Status 09/12/2018 FINAL  Final  Respiratory Panel by PCR     Status: None   Collection Time: 09/07/18  3:05 AM  Result Value Ref Range Status   Adenovirus NOT DETECTED NOT DETECTED Final   Coronavirus 229E NOT DETECTED NOT DETECTED Final   Coronavirus HKU1 NOT DETECTED NOT DETECTED Final   Coronavirus NL63 NOT DETECTED NOT DETECTED Final   Coronavirus OC43 NOT DETECTED NOT DETECTED Final   Metapneumovirus NOT DETECTED NOT DETECTED Final   Rhinovirus / Enterovirus NOT DETECTED NOT DETECTED Final   Influenza A NOT DETECTED NOT DETECTED Final   Influenza A H1 NOT DETECTED NOT DETECTED Final   Influenza A H1 2009 NOT DETECTED NOT DETECTED Final   Influenza A H3 NOT DETECTED NOT DETECTED Final   Influenza B NOT DETECTED NOT DETECTED Final   Parainfluenza Virus 1 NOT DETECTED NOT DETECTED Final   Parainfluenza Virus 2 NOT DETECTED NOT DETECTED Final   Parainfluenza Virus 3 NOT DETECTED NOT DETECTED Final   Parainfluenza Virus 4 NOT DETECTED NOT DETECTED Final   Respiratory Syncytial Virus NOT DETECTED NOT DETECTED Final   Bordetella pertussis NOT DETECTED NOT DETECTED Final   Chlamydophila pneumoniae NOT DETECTED NOT DETECTED Final   Mycoplasma pneumoniae NOT DETECTED NOT DETECTED Final    Comment:  Performed at [facility], [place] [address] [place], [place] [address]  MRSA PCR Screening     Status: Abnormal   Collection Time: 09/07/18  6:25 AM  Result Value Ref Range Status   MRSA by PCR POSITIVE (A) NEGATIVE Final    Comment:        The GeneXpert MRSA Assay (FDA approved for NASAL specimens only), is one component of a comprehensive MRSA colonization surveillance program. It is not intended to diagnose MRSA infection nor to guide or monitor treatment for MRSA infections. RESULT CALLED TO, READ BACK BY AND VERIFIED WITH: RN Z IDI [id number] [phone] MLM Performed at [facility], [place] [address] [place], [place] [address]   Urine Culture     Status: Abnormal   Collection Time: 09/07/18  2:29 PM  Result Value Ref Range Status   Specimen Description URINE, RANDOM  Final   Special Requests   Final    NONE Performed at [facility], [place] [address] [place], [place] [address]    Culture MULTIPLE SPECIES PRESENT, SUGGEST RECOLLECTION (A)  Final   Report Status 09/08/2018 FINAL  Final  Culture, sputum-assessment     Status: None   Collection Time: 09/07/18  9:39 PM  Result Value Ref Range Status   Specimen Description SPUTUM  Final   Special Requests NONE  Final   Sputum evaluation   Final    THIS SPECIMEN IS ACCEPTABLE FOR SPUTUM CULTURE Performed at [facility], [place] [address] [place], [place] [address]    Report Status 09/08/2018 FINAL  Final  Culture, respiratory     Status: None (Preliminary result)   Collection Time: 09/07/18  9:39 PM  Result Value Ref Range Status   Specimen Description SPUTUM  Final   Special Requests NONE Reflexed from [id number]  Final   Gram Stain   Final    MODERATE WBC PRESENT, PREDOMINANTLY PMN MODERATE GRAM POSITIVE COCCI    Culture   Final    MODERATE ROTHIA MUCILAGINOSA Standardized susceptibility testing for this organism is not available. RARE  STAPHYLOCOCCUS AUREUS REPEATING SUSCEPTIBILITIES Performed at [facility], [place] [address], [place], [place] [address]    Report Status PENDING  Incomplete     Studies: Dg Esophagus  Result Date: 09/13/2018 CLINICAL DATA:  Nausea, vomiting, sensation of food getting stuck in the lower esophagus. EXAM: ESOPHOGRAM/BARIUM SWALLOW TECHNIQUE: Single contrast examination was performed using thin barium or water soluble. FLUOROSCOPY TIME:  Fluoroscopy Time:  1 minutes 36 seconds Number of Acquired Spot Images: 5 COMPARISON:  CT of the chest 12/16/2017 FINDINGS: The hypopharynx demonstrated no filling defects and was normal in contour. The esophagus was smooth in contour. There was no narrowing or stricture. There was no delay in the passage of a barium tablet into the stomach. There was no hiatal hernia. Decreased mobility of the esophagus with prominent tertiary contractions and delay of clearing of contrast. Spontaneous gastroesophageal reflux. A 13 mm barium tablet passed without difficulties into the stomach. IMPRESSION: Decreased mobility of the esophagus with prominent tertiary contractions. Spontaneous gastroesophageal reflux. No structural abnormality seen. Electronically Signed   By: Fidela Salisbury M.D.   On: 09/13/2018 10:20    Scheduled Meds: . allopurinol  100 mg Oral Daily  . amiodarone  200 mg Oral Daily  . apixaban  5 mg Oral BID  . arformoterol  15 mcg Nebulization BID  . budesonide (PULMICORT) nebulizer solution  0.25 mg Nebulization BID  . calcium-vitamin D  1 tablet Oral Q breakfast  . Chlorhexidine Gluconate Cloth  6 each Topical Q0600  . ezetimibe-simvastatin  1 tablet Oral QHS  . guaiFENesin  600 mg Oral BID  . insulin aspart  0-15 Units Subcutaneous TID WC  . insulin NPH Human  25 Units Subcutaneous BID AC & HS  . ipratropium-albuterol  3 mL Nebulization TID  . levothyroxine  25 mcg Oral QAC breakfast  . metoprolol tartrate  50 mg Oral BID  . pantoprazole  20 mg Oral Daily  . potassium chloride SA  20 mEq Oral TID  . predniSONE  50  mg Oral Q breakfast  . sodium chloride flush  3 mL Intravenous Q12H  . torsemide  80 mg Oral BID    Continuous Infusions: . sodium chloride 250 mL (09/13/18 0238)  . ceFEPime (MAXIPIME) IV 1 g (09/13/18 0238)  . vancomycin Stopped (09/12/18 1452)     Time spent: 37mins, case discussed with cardiology I have personally reviewed and interpreted on  09/13/2018 daily labs, tele strips, imagings as discussed above under date review session and assessment and plans.  I reviewed all nursing notes, pharmacy notes, consultant notes,  vitals, pertinent old records  I have discussed plan of care as described above with RN , patient  And family on 09/13/2018   Florencia Reasons MD, PhD  [facility] Pager [phone]. If 7PM-7AM, please contact night-coverage at [email or web address], password [facility] 09/13/2018, 11:32 AM  LOS: 6 days

## 2018-09-14 DIAGNOSIS — D638 Anemia in other chronic diseases classified elsewhere: Secondary | ICD-10-CM | POA: Diagnosis not present

## 2018-09-14 DIAGNOSIS — E119 Type 2 diabetes mellitus without complications: Secondary | ICD-10-CM

## 2018-09-14 DIAGNOSIS — R627 Adult failure to thrive: Secondary | ICD-10-CM | POA: Diagnosis not present

## 2018-09-14 DIAGNOSIS — R1310 Dysphagia, unspecified: Secondary | ICD-10-CM | POA: Diagnosis not present

## 2018-09-14 DIAGNOSIS — N189 Chronic kidney disease, unspecified: Secondary | ICD-10-CM | POA: Diagnosis not present

## 2018-09-14 DIAGNOSIS — I509 Heart failure, unspecified: Secondary | ICD-10-CM | POA: Diagnosis not present

## 2018-09-14 DIAGNOSIS — I350 Nonrheumatic aortic (valve) stenosis: Secondary | ICD-10-CM | POA: Diagnosis not present

## 2018-09-14 DIAGNOSIS — I5033 Acute on chronic diastolic (congestive) heart failure: Secondary | ICD-10-CM | POA: Diagnosis not present

## 2018-09-14 DIAGNOSIS — M2550 Pain in unspecified joint: Secondary | ICD-10-CM | POA: Diagnosis not present

## 2018-09-14 DIAGNOSIS — J9600 Acute respiratory failure, unspecified whether with hypoxia or hypercapnia: Secondary | ICD-10-CM | POA: Diagnosis not present

## 2018-09-14 DIAGNOSIS — R52 Pain, unspecified: Secondary | ICD-10-CM | POA: Diagnosis not present

## 2018-09-14 DIAGNOSIS — J189 Pneumonia, unspecified organism: Secondary | ICD-10-CM | POA: Diagnosis not present

## 2018-09-14 DIAGNOSIS — I060 Rheumatic aortic stenosis: Secondary | ICD-10-CM | POA: Diagnosis not present

## 2018-09-14 DIAGNOSIS — Z7401 Bed confinement status: Secondary | ICD-10-CM | POA: Diagnosis not present

## 2018-09-14 DIAGNOSIS — J449 Chronic obstructive pulmonary disease, unspecified: Secondary | ICD-10-CM | POA: Diagnosis not present

## 2018-09-14 DIAGNOSIS — I4510 Unspecified right bundle-branch block: Secondary | ICD-10-CM | POA: Diagnosis not present

## 2018-09-14 DIAGNOSIS — J9621 Acute and chronic respiratory failure with hypoxia: Secondary | ICD-10-CM | POA: Diagnosis not present

## 2018-09-14 DIAGNOSIS — D649 Anemia, unspecified: Secondary | ICD-10-CM | POA: Diagnosis not present

## 2018-09-14 DIAGNOSIS — E569 Vitamin deficiency, unspecified: Secondary | ICD-10-CM | POA: Diagnosis not present

## 2018-09-14 DIAGNOSIS — I480 Paroxysmal atrial fibrillation: Secondary | ICD-10-CM | POA: Diagnosis not present

## 2018-09-14 DIAGNOSIS — Z794 Long term (current) use of insulin: Secondary | ICD-10-CM

## 2018-09-14 DIAGNOSIS — I10 Essential (primary) hypertension: Secondary | ICD-10-CM | POA: Diagnosis not present

## 2018-09-14 DIAGNOSIS — Z23 Encounter for immunization: Secondary | ICD-10-CM | POA: Diagnosis not present

## 2018-09-14 DIAGNOSIS — R110 Nausea: Secondary | ICD-10-CM | POA: Diagnosis not present

## 2018-09-14 DIAGNOSIS — J9611 Chronic respiratory failure with hypoxia: Secondary | ICD-10-CM | POA: Diagnosis not present

## 2018-09-14 DIAGNOSIS — R2689 Other abnormalities of gait and mobility: Secondary | ICD-10-CM | POA: Diagnosis not present

## 2018-09-14 DIAGNOSIS — L8990 Pressure ulcer of unspecified site, unspecified stage: Secondary | ICD-10-CM

## 2018-09-14 DIAGNOSIS — M1000 Idiopathic gout, unspecified site: Secondary | ICD-10-CM | POA: Diagnosis not present

## 2018-09-14 DIAGNOSIS — Z111 Encounter for screening for respiratory tuberculosis: Secondary | ICD-10-CM | POA: Diagnosis not present

## 2018-09-14 DIAGNOSIS — A419 Sepsis, unspecified organism: Secondary | ICD-10-CM | POA: Diagnosis not present

## 2018-09-14 DIAGNOSIS — N184 Chronic kidney disease, stage 4 (severe): Secondary | ICD-10-CM | POA: Diagnosis not present

## 2018-09-14 DIAGNOSIS — E039 Hypothyroidism, unspecified: Secondary | ICD-10-CM | POA: Diagnosis not present

## 2018-09-14 DIAGNOSIS — M6281 Muscle weakness (generalized): Secondary | ICD-10-CM | POA: Diagnosis not present

## 2018-09-14 DIAGNOSIS — K219 Gastro-esophageal reflux disease without esophagitis: Secondary | ICD-10-CM | POA: Diagnosis not present

## 2018-09-14 DIAGNOSIS — E785 Hyperlipidemia, unspecified: Secondary | ICD-10-CM | POA: Diagnosis not present

## 2018-09-14 DIAGNOSIS — K5900 Constipation, unspecified: Secondary | ICD-10-CM | POA: Diagnosis not present

## 2018-09-14 DIAGNOSIS — I4891 Unspecified atrial fibrillation: Secondary | ICD-10-CM | POA: Diagnosis not present

## 2018-09-14 DIAGNOSIS — N390 Urinary tract infection, site not specified: Secondary | ICD-10-CM | POA: Diagnosis not present

## 2018-09-14 LAB — CBC WITH DIFFERENTIAL/PLATELET
Abs Immature Granulocytes: 0.2 10*3/uL — ABNORMAL HIGH (ref 0.00–0.07)
Basophils Absolute: 0 10*3/uL (ref 0.0–0.1)
Basophils Relative: 0 %
EOS PCT: 0 %
Eosinophils Absolute: 0 10*3/uL (ref 0.0–0.5)
HEMATOCRIT: 28.4 % — AB (ref 36.0–46.0)
HEMOGLOBIN: 8.1 g/dL — AB (ref 12.0–15.0)
Immature Granulocytes: 1 %
LYMPHS ABS: 0.4 10*3/uL — AB (ref 0.7–4.0)
LYMPHS PCT: 3 %
MCH: 27.6 pg (ref 26.0–34.0)
MCHC: 28.5 g/dL — AB (ref 30.0–36.0)
MCV: 96.9 fL (ref 80.0–100.0)
MONO ABS: 0.9 10*3/uL (ref 0.1–1.0)
MONOS PCT: 6 %
Neutro Abs: 12.6 10*3/uL — ABNORMAL HIGH (ref 1.7–7.7)
Neutrophils Relative %: 90 %
Platelets: 216 10*3/uL (ref 150–400)
RBC: 2.93 MIL/uL — ABNORMAL LOW (ref 3.87–5.11)
RDW: 18.7 % — ABNORMAL HIGH (ref 11.5–15.5)
WBC: 14 10*3/uL — ABNORMAL HIGH (ref 4.0–10.5)
nRBC: 0.4 % — ABNORMAL HIGH (ref 0.0–0.2)

## 2018-09-14 LAB — BASIC METABOLIC PANEL
Anion gap: 10 (ref 5–15)
BUN: 84 mg/dL — AB (ref 8–23)
CHLORIDE: 89 mmol/L — AB (ref 98–111)
CO2: 39 mmol/L — AB (ref 22–32)
CREATININE: 1.97 mg/dL — AB (ref 0.44–1.00)
Calcium: 9.5 mg/dL (ref 8.9–10.3)
GFR calc Af Amer: 28 mL/min — ABNORMAL LOW (ref >60)
GFR calc non Af Amer: 24 mL/min — ABNORMAL LOW (ref >60)
Glucose, Bld: 212 mg/dL — ABNORMAL HIGH (ref 70–99)
Potassium: 4.5 mmol/L (ref 3.5–5.1)
SODIUM: 138 mmol/L (ref 135–145)

## 2018-09-14 LAB — GLUCOSE, CAPILLARY
GLUCOSE-CAPILLARY: 202 mg/dL — AB (ref 70–99)
GLUCOSE-CAPILLARY: 170 mg/dL — AB (ref 70–99)

## 2018-09-14 MED ORDER — POTASSIUM CHLORIDE CRYS ER 20 MEQ PO TBCR: 20.0000 meq | EXTENDED_RELEASE_TABLET | Freq: Every evening | ORAL | 0 refills | Status: DC

## 2018-09-14 MED ORDER — AMOXICILLIN-POT CLAVULANATE 500-125 MG PO TABS: 1.0000 | ORAL_TABLET | Freq: Two times a day (BID) | ORAL | 0 refills | Status: AC

## 2018-09-14 MED ORDER — INSULIN ASPART 100 UNIT/ML ~~LOC~~ SOLN: 0.0000 [IU] | Freq: Three times a day (TID) | SUBCUTANEOUS | 11 refills | Status: AC

## 2018-09-14 MED ORDER — TORSEMIDE 20 MG PO TABS: 80.0000 mg | ORAL_TABLET | Freq: Two times a day (BID) | ORAL | 0 refills | Status: AC

## 2018-09-14 MED ORDER — GUAIFENESIN ER 600 MG PO TB12: 600.0000 mg | ORAL_TABLET | Freq: Two times a day (BID) | ORAL | 0 refills | Status: AC

## 2018-09-14 MED ORDER — POTASSIUM CHLORIDE CRYS ER 20 MEQ PO TBCR: 40.0000 meq | EXTENDED_RELEASE_TABLET | Freq: Every day | ORAL | 0 refills | Status: DC

## 2018-09-14 MED ORDER — PREDNISONE 10 MG PO TABS: ORAL_TABLET | ORAL | 0 refills | Status: DC

## 2018-09-14 NOTE — Progress Notes (Signed)
Called lab regarding MD care order Lab technician stated collected at 0800 and awaiting processing

## 2018-09-14 NOTE — Progress Notes (Signed)
Clinical Social Worker facilitated patient discharge including contacting patient family and facility to confirm patient discharge plans.  Clinical information faxed to facility and family agreeable with plan.  CSW arranged ambulance transport via [facility] to [facility] .  RN to call for report prior to [phone]  Clinical Social Worker will sign off for now as social work intervention is no longer needed. Please consult [place] again if new need arises.  Mahamad Analyce Tavares LCSW [phone]

## 2018-09-14 NOTE — Progress Notes (Signed)
Attempted to call report to Lattie Haw RN at [facility] Pt IVs removed by nursing student, catheter intact. Pt telemetry removed. Pt has all belongings. AVS, DNR, transport papers and printed prescriptions in folder. Awaiting PTAR. Pt aware

## 2018-09-14 NOTE — Discharge Summary (Signed)
Discharge Summary  Paige Hardin NKN:[id number] DOB: 1946/09/21  PCP: Reymundo Poll, MD  Admit date: 09/06/2018 Discharge date: 09/14/2018  Time spent: 58mins, more than 50% time spent on coordination of care  Recommendations for Outpatient Follow-up:  1. F/u with SNF MD  for hospital discharge follow up, repeat cbc/bmp at follow up. 2. F/u with cardiology/heart failure clinic  Discharge Diagnoses:  Active Hospital Problems   Diagnosis Date Noted  . CHF exacerbation ([place]) 09/07/2018  . Pressure injury of skin 09/14/2018  . CKD (chronic kidney disease), stage IV ([place]) 09/07/2018  . Iron deficiency anemia 03/29/2014  . Prolonged QT interval 03/01/2014  . Morbid obesity- BMI 50 01/21/2014  . Poorly controlled diabetes mellitus ([place] [place]) 01/21/2014  . COPD with emphysema  05/05/2009  . PAF (paroxysmal atrial fibrillation) s/p multiple DCCVs; on Amiodarone 05/05/2009    Resolved Hospital Problems  No resolved problems to display.    Discharge Condition: stable  Diet recommendation: heart healthy/carb modified/1200cc fluids restriction up at 90 degree when eat and 41mins after   Filed Weights   09/12/18 0803 09/13/18 0657 09/14/18 0520  Weight: (!) 138.1 kg (!) 136.5 kg 132 kg    History of present illness: (per admitting MD Dr Tamala Julian) PCP: Reymundo Poll, MD  Patient coming from: [facility] facility via EMS  Chief Complaint: Shortness of breath  I have personally briefly reviewed patient's old medical records in [place]   HPI: Paige Hardin is a 72 y.o. female with medical history significant of A. fib on Eliquis, COPD, oxygen dependent on 4 L, diastolic CHF, and iron deficiency anemia requiring frequent blood transfusions; who presents with complaints of progressively worsening shortness of breath and cough over the last 10 days.  Reports having productive cough with increased sputum production.  O2 sats were noted to be in the 80s on her regular oxygen  requirements of 4 L.  Associated symptoms included fatigue, nausea, lower extremity edema, orthopnea, and fever at the facility of 100.5 F today. She tried utilizing multiple breathing treatments without relief of symptoms.  Any amount of exertion seems to worsen symptoms.  Denies having any chest pain, vomiting, or known recent sick contacts.  En route with EMS patient was increased to 5 L nasal cannula oxygen with some improvement O2 saturations, given 125 mg of Solu-Medrol, 5 albuterol, 300 mL of normal saline IV fluids.  ED Course: Upon admission to the emergency department patient was noted to be mildly tachypneic with respirations up to 23, blood pressure stable, O2 saturations 9196% on 5 L of nasal cannula oxygen.  Labs revealed WBC 23.1, hemoglobin 7.1(hemoglobin was 7.8 on 9/24)   Hospital Course:  Principal Problem:   CHF exacerbation ([place]) Active Problems:   COPD with emphysema    PAF (paroxysmal atrial fibrillation) s/p multiple DCCVs; on Amiodarone   Morbid obesity- BMI 50   Poorly controlled diabetes mellitus ([facility])   Prolonged QT interval   Iron deficiency anemia   CKD (chronic kidney disease), stage IV ([facility])   Pressure injury of skin  Acute on chronic respiratory failure with hypoxia secondary to acute on chronic diastolic CHF, symptomatic anemia, HCAP - O2 sats 89% on 5 L on presentation -at baseline she is on 4liters, now back to baseline  Acute on chronic diastolic CHF exacerbation -2D echo 06/11/2018 showed EF of 55 to 60% with grade 2 diastolic dysfunction - volume is improving bilateral lower extremity edema and pedal edema has much improved, negative 6 liters, weight down  from 307lb on admission to 291lb at discharge. -cardiology input appreciated  -Per Cardiology Dr Aundra Dubin patient can discharge on " torsemide 80 mg bid, KCl 40 qam/20 qpm, apixaban 5 mg bid, amiodarone 200 daily, metoprolol 50 bid, Vytorin.  Will arrange followup in CHF clinic."   Moderate Aortic  stenosis  Last echo  in 7/19 She is followed by cardiology  Paroxysmal atrial fibrillation -Currently normal sinus rhythm, continue amiodarone/lopressor -Continue Eliquis  RBBB chronic  Sepsis secondary to suspected HCAP, COPD exacerbation, presented on admission -Influenza negative, respiratory virus panel negative, Urine strep antigen negative. -Blood cultures negative so far -MRSa screening + -Sputum cultures with moderaterothia mucilaginosa (considered normal flora), +MRSA -she finished IV vancomycin and cefepimex7 days as of 10/18 -However, she might have an aspiration event , change abx to augmentin,  repeat cxr no acute infiltrate, she is feeling better after sitting up eating -she is discharged to SNF with steroids taper and augmentin for three days.   Dysphagia,   Denies choking  dg esophagus showed "Decreased mobility of the esophagus with prominent tertiary Contractions. Spontaneous gastroesophageal reflux. No structural abnormality seen." Continue PPI,  sit up at 90 degree when eat  UTI -UA positive for UTI, many bacteria, WBCs 21-50, positive leukocytes, nitrite -Urine culture showed multiple species,  -she finished abx treatment in the hospital.  Insulin dependent dm2 Lat a1c may not be acute in the setting of anemia and abnormal renal function Continue adjust insulin while on steroids   CKDIII Cr range from 1.6 to 2.2 at baseline Renal dosing meds  Anemia of chronic disease Followed by Dr. Marin Olp, has received as needed transfusions, last received darbepoetin infusion on 9/24 S/p prbc x1 units on 10/14 hgb has been above 8 after prbc transfusion No overt sign of bleeding,  she is to follow up with Dr Marin Olp   Morbid obesity Body mass index is 50.08 kg/m.  Per patient she does not have OSA by sleep study two yrs ago  FTT: She is from ALF, d/c to SNF, patient agrees     Code Status: DNR  Family Communication: patient daily  and niece on 10/18  Disposition Plan: SNF , wbc improving, cxr no acute changes, right sided wheezing resolved   Consultants:  Cardiology/heart failure team  Procedures:  none  Antibiotics:  Vanc/cefepime from admission to 10/18  augmentin from 10/18-   Discharge Exam: BP (!) 138/59 (BP Location: Right Wrist)   Pulse 89   Temp 98.3 F (36.8 C) (Oral)   Resp 20   Ht 5\' 5"  (1.651 m)   Wt 132 kg   SpO2 92%   BMI 48.44 kg/m    General:  Frail, chronically ill appearing, NAD  Cardiovascular: RRR  Respiratory: improved aeration, no wheezing, no rhonchi, no rales  Abdomen: Soft/ND/NT, positive BS  Musculoskeletal: bilateral lower extremity pitting Edema is improving, chronic venous stasis skin changes  Neuro: alert, oriented    Discharge Instructions You were cared for by a hospitalist during your hospital stay. If you have any questions about your discharge medications or the care you received while you were in the hospital after you are discharged, you can call the unit and asked to speak with the hospitalist on call if the hospitalist that took care of you is not available. Once you are discharged, your primary care physician will handle any further medical issues. Please note that NO REFILLS for any discharge medications will be authorized once you are discharged, as it is imperative that  you return to your primary care physician (or establish a relationship with a primary care physician if you do not have one) for your aftercare needs so that they can reassess your need for medications and monitor your lab values.  Discharge Instructions    Diet - low sodium heart healthy   Complete by:  As directed    Carb modified, fluids restriction 1200cc/day   Increase activity slowly   Complete by:  As directed      Allergies as of 09/14/2018      Reactions   Ace Inhibitors Cough   Statins Other (See Comments)   Listed on MAR   Clindamycin Rash      Medication  List    STOP taking these medications   dextromethorphan-guaiFENesin 30-600 MG 12hr tablet Commonly known as:  MUCINEX DM   diltiazem 180 MG 24 hr capsule Commonly known as:  CARDIZEM CD   furosemide 80 MG tablet Commonly known as:  LASIX   metolazone 2.5 MG tablet Commonly known as:  ZAROXOLYN     TAKE these medications   acetaminophen 325 MG tablet Commonly known as:  TYLENOL Take 325 mg by mouth daily as needed for mild pain.   albuterol (2.5 MG/3ML) 0.083% nebulizer solution Commonly known as:  PROVENTIL Take 2.5 mg by nebulization every 6 (six) hours as needed for wheezing or shortness of breath.   albuterol 108 (90 Base) MCG/ACT inhaler Commonly known as:  PROVENTIL HFA;VENTOLIN HFA Inhale 2 puffs into the lungs every 6 (six) hours as needed for wheezing or shortness of breath.   allopurinol 100 MG tablet Commonly known as:  ZYLOPRIM Take 1 tablet (100 mg total) by mouth daily.   amiodarone 200 MG tablet Commonly known as:  PACERONE Take 1 tablet (200 mg total) by mouth daily.   amoxicillin-clavulanate 500-125 MG tablet Commonly known as:  AUGMENTIN Take 1 tablet (500 mg total) by mouth 2 (two) times daily for 3 days.   apixaban 5 MG Tabs tablet Commonly known as:  ELIQUIS Take 1 tablet (5 mg total) by mouth 2 (two) times daily.   calcium-vitamin D 500-200 MG-UNIT tablet Commonly known as:  OSCAL WITH D Take 1 tablet by mouth daily with breakfast.   ezetimibe-simvastatin 10-40 MG tablet Commonly known as:  VYTORIN Take 1 tablet by mouth at bedtime.   guaiFENesin 600 MG 12 hr tablet Commonly known as:  MUCINEX Take 1 tablet (600 mg total) by mouth 2 (two) times daily.   insulin aspart 100 UNIT/ML injection Commonly known as:  novoLOG Inject 0-9 Units into the skin 3 (three) times daily with meals. Correction factor Sliding scale CBG 70 - 120: 0 units CBG 121 - 150: 1 unit,  CBG 151 - 200: 2 units,  CBG 201 - 250: 3 units,  CBG 251 - 300: 5 units,  CBG  301 - 350: 7 units,  CBG 351 - 400: 9 units   CBG > 400: 9 units and notify your MD What changed:  Another medication with the same name was removed. Continue taking this medication, and follow the directions you see here.   insulin NPH Human 100 UNIT/ML injection Commonly known as:  HUMULIN N,NOVOLIN N Inject 20-30 Units into the skin See admin instructions. Inject 20 units in the morning, and 30 units in the evening   levothyroxine 25 MCG tablet Commonly known as:  SYNTHROID, LEVOTHROID Take 1 tablet (25 mcg total) by mouth daily before breakfast. What changed:  how much to take  metoprolol tartrate 50 MG tablet Commonly known as:  LOPRESSOR Take 1 tablet (50 mg total) by mouth 2 (two) times daily.   MULTIVITAMIN/IRON PO Take 1 tablet by mouth daily.   omeprazole 20 MG capsule Commonly known as:  PRILOSEC Take 20 mg by mouth daily.   ondansetron 4 MG tablet Commonly known as:  ZOFRAN Take 4 mg by mouth every 6 (six) hours as needed for nausea or vomiting.   OXYGEN Inhale 4 L into the lungs continuous.   potassium chloride SA 20 MEQ tablet Commonly known as:  K-DUR,KLOR-CON Take 1 tablet (20 mEq total) by mouth every evening. **SCHEDULED** What changed:  when to take this   potassium chloride SA 20 MEQ tablet Commonly known as:  K-DUR,KLOR-CON Take 2 tablets (40 mEq total) by mouth daily. **Take with scheduled 0700 dose to equal 40 MEQ every Wednesday** What changed:    how much to take  when to take this   predniSONE 10 MG tablet Commonly known as:  DELTASONE Take 30mg  ( three tabs ) on day one, then 20mg  ( 2tabs ) on day two, then 10mg  (1tab) on day three, then stop. What changed:    how much to take  how to take this  when to take this  additional instructions   Tiotropium Bromide-Olodaterol 2.5-2.5 MCG/ACT Aers Inhale 2 puffs into the lungs daily.   torsemide 20 MG tablet Commonly known as:  DEMADEX Take 4 tablets (80 mg total) by mouth 2 (two)  times daily.   vitamin B-12 500 MCG tablet Commonly known as:  CYANOCOBALAMIN Take 500 mcg by mouth daily.   Vitamin C 500 MG Caps Take 500 mg by mouth daily.   Vitamin D 2000 units tablet Take 2,000 Units by mouth daily.      Allergies  Allergen Reactions  . Ace Inhibitors Cough  . Statins Other (See Comments)    Listed on MAR  . Clindamycin Rash    Contact information for follow-up providers    [place]. Go on 09/23/2018.   Specialty:  Cardiology Why:  at 0930 in the [facility].  Please bring all medications to appt.  Gate code for October is [place] information: [address] [id number] [place]       Reymundo Poll, MD Follow up.   Specialty:  Family Medicine Contact information: [address] [place] [address] [phone]            Contact information for after-discharge care    Destination    [facility] Preferred SNF .   Service:  Skilled Nursing Contact information: [address] [place] [phone]                   The results of significant diagnostics from this hospitalization (including imaging, microbiology, ancillary and laboratory) are listed below for reference.    Significant Diagnostic Studies: Dg Chest 2 View  Result Date: 09/13/2018 CLINICAL DATA:  Cough EXAM: CHEST - 2 VIEW COMPARISON:  09/06/2018, CT chest 12/18/2017 FINDINGS: Cardiomegaly with vascular congestion. Diffuse interstitial opacity is suspicious for mild pulmonary edema. There are trace pleural effusions. Aortic atherosclerosis. No pneumothorax. IMPRESSION: Cardiomegaly with vascular congestion, mild interstitial edema and tiny pleural effusions. Electronically Signed   By: Donavan Foil M.D.   On: 09/13/2018 21:37   Dg Chest 2 View  Result Date: 09/06/2018 CLINICAL DATA:  72 year old female  presents with dyspnea. EXAM: CHEST - 2 VIEW COMPARISON:  Chest CT 12/18/2017, CXR 10/17/2016 FINDINGS: The cardiopericardial silhouette is enlarged as before. Diffuse interstitial edema is accentuated on current exam. Moderate aortic atherosclerosis with slight uncoiling. No aortic aneurysm. No acute osseous abnormality. No significant pleural effusion or pneumothorax. The posterior costophrenic angles are excluded on the lateral view. IMPRESSION: Cardiomegaly with diffuse interstitial pulmonary edema. Aortic atherosclerosis without aneurysm. Electronically Signed   By: Ashley Royalty M.D.   On: 09/06/2018 23:32   Dg Esophagus  Result Date: 09/13/2018 CLINICAL DATA:  Nausea, vomiting, sensation of food getting stuck in the lower esophagus. EXAM: ESOPHOGRAM/BARIUM SWALLOW TECHNIQUE: Single contrast examination was performed using thin barium or water soluble. FLUOROSCOPY TIME:  Fluoroscopy Time:  1 minutes 36 seconds Number of Acquired Spot Images: 5 COMPARISON:  CT of the chest 12/16/2017 FINDINGS: The hypopharynx demonstrated no filling defects and was normal in contour. The esophagus was smooth in contour. There was no narrowing or stricture. There was no delay in the passage of a barium tablet into the stomach. There was no hiatal hernia. Decreased mobility of the esophagus with prominent tertiary contractions and delay of clearing of contrast. Spontaneous gastroesophageal reflux. A 13 mm barium tablet passed without difficulties into the stomach. IMPRESSION: Decreased mobility of the esophagus with prominent tertiary contractions. Spontaneous gastroesophageal reflux. No structural abnormality seen. Electronically Signed   By: Fidela Salisbury M.D.   On: 09/13/2018 10:20    Microbiology: Recent Results (from the past 240 hour(s))  Culture, blood (routine x 2)     Status: None   Collection Time: 09/07/18  2:38 AM  Result Value Ref Range Status   Specimen Description BLOOD RIGHT HAND  Final   Special  Requests   Final    BOTTLES DRAWN AEROBIC AND ANAEROBIC Blood Culture adequate volume   Culture   Final    NO GROWTH 5 DAYS Performed at [facility], [address] [place], [place] [address]    Report Status 09/12/2018 FINAL  Final  Culture, blood (routine x 2)     Status: None   Collection Time: 09/07/18  2:55 AM  Result Value Ref Range Status   Specimen Description BLOOD LEFT FOREARM  Final   Special Requests   Final    BOTTLES DRAWN AEROBIC AND ANAEROBIC Blood Culture adequate volume   Culture   Final    NO GROWTH 5 DAYS Performed at [facility], [place] [address] [place], [place] [address]    Report Status 09/12/2018 FINAL  Final  Respiratory Panel by PCR     Status: None   Collection Time: 09/07/18  3:05 AM  Result Value Ref Range Status   Adenovirus NOT DETECTED NOT DETECTED Final   Coronavirus 229E NOT DETECTED NOT DETECTED Final   Coronavirus HKU1 NOT DETECTED NOT DETECTED Final   Coronavirus NL63 NOT DETECTED NOT DETECTED Final   Coronavirus OC43 NOT DETECTED NOT DETECTED Final   Metapneumovirus NOT DETECTED NOT DETECTED Final   Rhinovirus / Enterovirus NOT DETECTED NOT DETECTED Final   Influenza A NOT DETECTED NOT DETECTED Final   Influenza A H1 NOT DETECTED NOT DETECTED Final   Influenza A H1 2009 NOT DETECTED NOT DETECTED Final   Influenza A H3 NOT DETECTED NOT DETECTED Final   Influenza B NOT DETECTED NOT DETECTED Final   Parainfluenza Virus 1 NOT DETECTED NOT DETECTED Final   Parainfluenza Virus 2 NOT DETECTED NOT DETECTED Final   Parainfluenza Virus 3 NOT  DETECTED NOT DETECTED Final   Parainfluenza Virus 4 NOT DETECTED NOT DETECTED Final   Respiratory Syncytial Virus NOT DETECTED NOT DETECTED Final   Bordetella pertussis NOT DETECTED NOT DETECTED Final   Chlamydophila pneumoniae NOT DETECTED NOT DETECTED Final   Mycoplasma pneumoniae NOT DETECTED NOT DETECTED Final    Comment: Performed at [facility], [place] [address], [place], [place]  [address]  MRSA PCR Screening     Status: Abnormal   Collection Time: 09/07/18  6:25 AM  Result Value Ref Range Status   MRSA by PCR POSITIVE (A) NEGATIVE Final    Comment:        The GeneXpert MRSA Assay (FDA approved for NASAL specimens only), is one component of a comprehensive MRSA colonization surveillance program. It is not intended to diagnose MRSA infection nor to guide or monitor treatment for MRSA infections. RESULT CALLED TO, READ BACK BY AND VERIFIED WITH: RN Z IDI [id number] [phone] MLM Performed at [facility], [place] [address] [place], [place] [address]   Urine Culture     Status: Abnormal   Collection Time: 09/07/18  2:29 PM  Result Value Ref Range Status   Specimen Description URINE, RANDOM  Final   Special Requests   Final    NONE Performed at [facility], [place] [address], [place], [place] [address]    Culture MULTIPLE SPECIES PRESENT, SUGGEST RECOLLECTION (A)  Final   Report Status 09/08/2018 FINAL  Final  Culture, sputum-assessment     Status: None   Collection Time: 09/07/18  9:39 PM  Result Value Ref Range Status   Specimen Description SPUTUM  Final   Special Requests NONE  Final   Sputum evaluation   Final    THIS SPECIMEN IS ACCEPTABLE FOR SPUTUM CULTURE Performed at [facility], [place] [address] [place], [place] [address]    Report Status 09/08/2018 FINAL  Final  Culture, respiratory     Status: None   Collection Time: 09/07/18  9:39 PM  Result Value Ref Range Status   Specimen Description SPUTUM  Final   Special Requests NONE Reflexed from [id number]  Final   Gram Stain   Final    MODERATE WBC PRESENT, PREDOMINANTLY PMN MODERATE GRAM POSITIVE COCCI Performed at [facility], [place] [address], [place], [place] [address]    Culture   Final    MODERATE ROTHIA MUCILAGINOSA Standardized susceptibility testing for this organism is not available. RARE METHICILLIN RESISTANT STAPHYLOCOCCUS AUREUS    Report Status 09/13/2018 FINAL  Final    Organism ID, Bacteria METHICILLIN RESISTANT STAPHYLOCOCCUS AUREUS  Final      Susceptibility   Methicillin resistant staphylococcus aureus - MIC*    CIPROFLOXACIN >=8 RESISTANT Resistant     ERYTHROMYCIN >=8 RESISTANT Resistant     GENTAMICIN <=0.5 SENSITIVE Sensitive     OXACILLIN >=4 RESISTANT Resistant     TETRACYCLINE <=1 SENSITIVE Sensitive     VANCOMYCIN <=0.5 SENSITIVE Sensitive     TRIMETH/SULFA <=10 SENSITIVE Sensitive     CLINDAMYCIN <=0.25 SENSITIVE Sensitive     RIFAMPIN <=0.5 SENSITIVE Sensitive     Inducible Clindamycin NEGATIVE Sensitive     * RARE METHICILLIN RESISTANT STAPHYLOCOCCUS AUREUS     Labs: Basic Metabolic Panel: Recent Labs  Lab 09/10/18 0508 09/11/18 0357 09/12/18 0505 09/13/18 0416 09/14/18 0632  NA 140 139 138 137 138  K 4.2 4.2 4.2 4.3 4.5  CL 92* 94* 93* 91* 89*  CO2 36* 35*  33* 35* 39*  GLUCOSE 205* 234* 218* 134* 212*  BUN 67* 75* 85* 83* 84*  CREATININE 1.61* 1.82* 1.94* 1.86* 1.97*  CALCIUM 9.8 9.6 9.4 9.8 9.5  MG  --   --  2.0  --   --    Liver Function Tests: No results for input(s): AST, ALT, ALKPHOS, BILITOT, PROT, ALBUMIN in the last 168 hours. No results for input(s): LIPASE, AMYLASE in the last 168 hours. No results for input(s): AMMONIA in the last 168 hours. CBC: Recent Labs  Lab 09/09/18 0507 09/10/18 0508 09/11/18 0357 09/13/18 0416 09/14/18 0632  WBC 14.4* 7.3 10.2 16.3* 14.0*  NEUTROABS  --   --   --   --  12.6*  HGB 8.3* 8.2* 8.6* 7.9* 8.1*  HCT 29.8* 28.0* 30.0* 27.5* 28.4*  MCV 99.3 96.6 99.0 97.2 96.9  PLT 197 185 199 235 216   Cardiac Enzymes: Recent Labs  Lab 09/07/18 1257  TROPONINI 0.03*   BNP: BNP (last 3 results) Recent Labs    09/07/18 0035  BNP 1,037.1*    ProBNP (last 3 results) No results for input(s): PROBNP in the last 8760 hours.  CBG: Recent Labs  Lab 09/12/18 2229 09/13/18 0751 09/13/18 1200 09/13/18 1659 09/13/18 2050  GLUCAP 162* 125* 101* 209* 286*        Signed:  Florencia Reasons MD, PhD  [facility] 09/14/2018, 10:35 AM

## 2018-09-14 NOTE — Progress Notes (Signed)
Informed Lattie Haw of [facility] that [facility] transport is here

## 2018-09-14 NOTE — Progress Notes (Signed)
Report given to [place] her, RN will notify when [facility] arrives

## 2018-09-14 NOTE — Progress Notes (Signed)
[facility] at bedside  Report given to transporters will all paperwork

## 2018-09-16 DIAGNOSIS — J449 Chronic obstructive pulmonary disease, unspecified: Secondary | ICD-10-CM | POA: Diagnosis not present

## 2018-09-16 DIAGNOSIS — I4510 Unspecified right bundle-branch block: Secondary | ICD-10-CM | POA: Diagnosis not present

## 2018-09-16 DIAGNOSIS — J9611 Chronic respiratory failure with hypoxia: Secondary | ICD-10-CM | POA: Diagnosis not present

## 2018-09-16 DIAGNOSIS — E119 Type 2 diabetes mellitus without complications: Secondary | ICD-10-CM | POA: Diagnosis not present

## 2018-09-16 DIAGNOSIS — I480 Paroxysmal atrial fibrillation: Secondary | ICD-10-CM | POA: Diagnosis not present

## 2018-09-16 DIAGNOSIS — D638 Anemia in other chronic diseases classified elsewhere: Secondary | ICD-10-CM | POA: Diagnosis not present

## 2018-09-16 DIAGNOSIS — I060 Rheumatic aortic stenosis: Secondary | ICD-10-CM | POA: Diagnosis not present

## 2018-09-16 DIAGNOSIS — R627 Adult failure to thrive: Secondary | ICD-10-CM | POA: Diagnosis not present

## 2018-09-16 DIAGNOSIS — N390 Urinary tract infection, site not specified: Secondary | ICD-10-CM | POA: Diagnosis not present

## 2018-09-16 DIAGNOSIS — R1310 Dysphagia, unspecified: Secondary | ICD-10-CM | POA: Diagnosis not present

## 2018-09-16 DIAGNOSIS — I5033 Acute on chronic diastolic (congestive) heart failure: Secondary | ICD-10-CM | POA: Diagnosis not present

## 2018-09-16 DIAGNOSIS — A419 Sepsis, unspecified organism: Secondary | ICD-10-CM | POA: Diagnosis not present

## 2018-09-20 DIAGNOSIS — I350 Nonrheumatic aortic (valve) stenosis: Secondary | ICD-10-CM | POA: Diagnosis not present

## 2018-09-20 DIAGNOSIS — I5033 Acute on chronic diastolic (congestive) heart failure: Secondary | ICD-10-CM | POA: Diagnosis not present

## 2018-09-20 DIAGNOSIS — D638 Anemia in other chronic diseases classified elsewhere: Secondary | ICD-10-CM | POA: Diagnosis not present

## 2018-09-20 DIAGNOSIS — I480 Paroxysmal atrial fibrillation: Secondary | ICD-10-CM | POA: Diagnosis not present

## 2018-09-20 DIAGNOSIS — M6281 Muscle weakness (generalized): Secondary | ICD-10-CM | POA: Diagnosis not present

## 2018-09-20 DIAGNOSIS — R1310 Dysphagia, unspecified: Secondary | ICD-10-CM | POA: Diagnosis not present

## 2018-09-20 DIAGNOSIS — R52 Pain, unspecified: Secondary | ICD-10-CM | POA: Diagnosis not present

## 2018-09-20 DIAGNOSIS — J9621 Acute and chronic respiratory failure with hypoxia: Secondary | ICD-10-CM | POA: Diagnosis not present

## 2018-09-20 DIAGNOSIS — J449 Chronic obstructive pulmonary disease, unspecified: Secondary | ICD-10-CM | POA: Diagnosis not present

## 2018-09-20 DIAGNOSIS — M1000 Idiopathic gout, unspecified site: Secondary | ICD-10-CM | POA: Diagnosis not present

## 2018-09-20 DIAGNOSIS — A419 Sepsis, unspecified organism: Secondary | ICD-10-CM | POA: Diagnosis not present

## 2018-09-20 DIAGNOSIS — E119 Type 2 diabetes mellitus without complications: Secondary | ICD-10-CM | POA: Diagnosis not present

## 2018-09-23 ENCOUNTER — Inpatient Hospital Stay (HOSPITAL_COMMUNITY): Payer: Medicare Other

## 2018-09-25 DIAGNOSIS — I5033 Acute on chronic diastolic (congestive) heart failure: Secondary | ICD-10-CM | POA: Diagnosis not present

## 2018-09-25 DIAGNOSIS — D638 Anemia in other chronic diseases classified elsewhere: Secondary | ICD-10-CM | POA: Diagnosis not present

## 2018-09-25 DIAGNOSIS — A419 Sepsis, unspecified organism: Secondary | ICD-10-CM | POA: Diagnosis not present

## 2018-09-25 DIAGNOSIS — M6281 Muscle weakness (generalized): Secondary | ICD-10-CM | POA: Diagnosis not present

## 2018-09-25 DIAGNOSIS — I350 Nonrheumatic aortic (valve) stenosis: Secondary | ICD-10-CM | POA: Diagnosis not present

## 2018-09-25 DIAGNOSIS — M1000 Idiopathic gout, unspecified site: Secondary | ICD-10-CM | POA: Diagnosis not present

## 2018-09-25 DIAGNOSIS — I480 Paroxysmal atrial fibrillation: Secondary | ICD-10-CM | POA: Diagnosis not present

## 2018-09-25 DIAGNOSIS — R52 Pain, unspecified: Secondary | ICD-10-CM | POA: Diagnosis not present

## 2018-09-25 DIAGNOSIS — J9621 Acute and chronic respiratory failure with hypoxia: Secondary | ICD-10-CM | POA: Diagnosis not present

## 2018-09-25 DIAGNOSIS — R1310 Dysphagia, unspecified: Secondary | ICD-10-CM | POA: Diagnosis not present

## 2018-09-25 DIAGNOSIS — J449 Chronic obstructive pulmonary disease, unspecified: Secondary | ICD-10-CM | POA: Diagnosis not present

## 2018-09-25 DIAGNOSIS — E119 Type 2 diabetes mellitus without complications: Secondary | ICD-10-CM | POA: Diagnosis not present

## 2018-09-30 DIAGNOSIS — J9621 Acute and chronic respiratory failure with hypoxia: Secondary | ICD-10-CM | POA: Diagnosis not present

## 2018-09-30 DIAGNOSIS — E119 Type 2 diabetes mellitus without complications: Secondary | ICD-10-CM | POA: Diagnosis not present

## 2018-09-30 DIAGNOSIS — R1310 Dysphagia, unspecified: Secondary | ICD-10-CM | POA: Diagnosis not present

## 2018-09-30 DIAGNOSIS — A419 Sepsis, unspecified organism: Secondary | ICD-10-CM | POA: Diagnosis not present

## 2018-09-30 DIAGNOSIS — R52 Pain, unspecified: Secondary | ICD-10-CM | POA: Diagnosis not present

## 2018-09-30 DIAGNOSIS — I5033 Acute on chronic diastolic (congestive) heart failure: Secondary | ICD-10-CM | POA: Diagnosis not present

## 2018-09-30 DIAGNOSIS — D638 Anemia in other chronic diseases classified elsewhere: Secondary | ICD-10-CM | POA: Diagnosis not present

## 2018-09-30 DIAGNOSIS — I350 Nonrheumatic aortic (valve) stenosis: Secondary | ICD-10-CM | POA: Diagnosis not present

## 2018-09-30 DIAGNOSIS — M1000 Idiopathic gout, unspecified site: Secondary | ICD-10-CM | POA: Diagnosis not present

## 2018-09-30 DIAGNOSIS — I480 Paroxysmal atrial fibrillation: Secondary | ICD-10-CM | POA: Diagnosis not present

## 2018-09-30 DIAGNOSIS — M6281 Muscle weakness (generalized): Secondary | ICD-10-CM | POA: Diagnosis not present

## 2018-09-30 DIAGNOSIS — J449 Chronic obstructive pulmonary disease, unspecified: Secondary | ICD-10-CM | POA: Diagnosis not present

## 2018-10-01 ENCOUNTER — Ambulatory Visit: Payer: Medicare Other | Admitting: Hematology & Oncology

## 2018-10-01 ENCOUNTER — Ambulatory Visit: Payer: Medicare Other

## 2018-10-01 ENCOUNTER — Other Ambulatory Visit: Payer: Medicare Other

## 2018-10-02 DIAGNOSIS — J449 Chronic obstructive pulmonary disease, unspecified: Secondary | ICD-10-CM | POA: Diagnosis not present

## 2018-10-02 DIAGNOSIS — E119 Type 2 diabetes mellitus without complications: Secondary | ICD-10-CM | POA: Diagnosis not present

## 2018-10-02 DIAGNOSIS — M6281 Muscle weakness (generalized): Secondary | ICD-10-CM | POA: Diagnosis not present

## 2018-10-02 DIAGNOSIS — I5033 Acute on chronic diastolic (congestive) heart failure: Secondary | ICD-10-CM | POA: Diagnosis not present

## 2018-10-02 DIAGNOSIS — M1000 Idiopathic gout, unspecified site: Secondary | ICD-10-CM | POA: Diagnosis not present

## 2018-10-02 DIAGNOSIS — D638 Anemia in other chronic diseases classified elsewhere: Secondary | ICD-10-CM | POA: Diagnosis not present

## 2018-10-02 DIAGNOSIS — J9621 Acute and chronic respiratory failure with hypoxia: Secondary | ICD-10-CM | POA: Diagnosis not present

## 2018-10-02 DIAGNOSIS — A419 Sepsis, unspecified organism: Secondary | ICD-10-CM | POA: Diagnosis not present

## 2018-10-02 DIAGNOSIS — R1310 Dysphagia, unspecified: Secondary | ICD-10-CM | POA: Diagnosis not present

## 2018-10-02 DIAGNOSIS — I350 Nonrheumatic aortic (valve) stenosis: Secondary | ICD-10-CM | POA: Diagnosis not present

## 2018-10-02 DIAGNOSIS — I480 Paroxysmal atrial fibrillation: Secondary | ICD-10-CM | POA: Diagnosis not present

## 2018-10-02 DIAGNOSIS — R52 Pain, unspecified: Secondary | ICD-10-CM | POA: Diagnosis not present

## 2018-10-08 ENCOUNTER — Inpatient Hospital Stay (HOSPITAL_COMMUNITY): Payer: Medicare Other

## 2018-10-08 DIAGNOSIS — E1122 Type 2 diabetes mellitus with diabetic chronic kidney disease: Secondary | ICD-10-CM | POA: Diagnosis not present

## 2018-10-08 DIAGNOSIS — R5381 Other malaise: Secondary | ICD-10-CM | POA: Diagnosis not present

## 2018-10-08 DIAGNOSIS — I509 Heart failure, unspecified: Secondary | ICD-10-CM | POA: Diagnosis not present

## 2018-10-08 DIAGNOSIS — E039 Hypothyroidism, unspecified: Secondary | ICD-10-CM | POA: Diagnosis not present

## 2018-10-15 ENCOUNTER — Inpatient Hospital Stay (HOSPITAL_COMMUNITY): Payer: Medicare Other

## 2018-10-15 ENCOUNTER — Other Ambulatory Visit: Payer: Self-pay | Admitting: *Deleted

## 2018-10-15 ENCOUNTER — Encounter (HOSPITAL_COMMUNITY): Payer: Medicare Other | Admitting: Cardiology

## 2018-10-15 ENCOUNTER — Inpatient Hospital Stay: Payer: Medicare Other

## 2018-10-15 ENCOUNTER — Inpatient Hospital Stay (HOSPITAL_BASED_OUTPATIENT_CLINIC_OR_DEPARTMENT_OTHER): Payer: Medicare Other | Admitting: Hematology & Oncology

## 2018-10-15 ENCOUNTER — Inpatient Hospital Stay: Payer: Medicare Other | Attending: Hematology & Oncology

## 2018-10-15 VITALS — BP 90/44 | HR 67 | Temp 98.8°F | Resp 18 | Ht 65.0 in | Wt 286.0 lb

## 2018-10-15 DIAGNOSIS — N183 Chronic kidney disease, stage 3 unspecified: Secondary | ICD-10-CM

## 2018-10-15 DIAGNOSIS — D631 Anemia in chronic kidney disease: Secondary | ICD-10-CM | POA: Diagnosis not present

## 2018-10-15 DIAGNOSIS — Z9981 Dependence on supplemental oxygen: Secondary | ICD-10-CM | POA: Diagnosis not present

## 2018-10-15 DIAGNOSIS — D509 Iron deficiency anemia, unspecified: Secondary | ICD-10-CM | POA: Diagnosis not present

## 2018-10-15 DIAGNOSIS — Z7901 Long term (current) use of anticoagulants: Secondary | ICD-10-CM

## 2018-10-15 DIAGNOSIS — D508 Other iron deficiency anemias: Secondary | ICD-10-CM

## 2018-10-15 DIAGNOSIS — I429 Cardiomyopathy, unspecified: Secondary | ICD-10-CM | POA: Diagnosis not present

## 2018-10-15 DIAGNOSIS — I480 Paroxysmal atrial fibrillation: Secondary | ICD-10-CM | POA: Insufficient documentation

## 2018-10-15 DIAGNOSIS — Z79899 Other long term (current) drug therapy: Secondary | ICD-10-CM

## 2018-10-15 DIAGNOSIS — E1122 Type 2 diabetes mellitus with diabetic chronic kidney disease: Secondary | ICD-10-CM | POA: Insufficient documentation

## 2018-10-15 DIAGNOSIS — D500 Iron deficiency anemia secondary to blood loss (chronic): Secondary | ICD-10-CM

## 2018-10-15 DIAGNOSIS — Z794 Long term (current) use of insulin: Secondary | ICD-10-CM | POA: Insufficient documentation

## 2018-10-15 DIAGNOSIS — R042 Hemoptysis: Secondary | ICD-10-CM

## 2018-10-15 LAB — CBC WITH DIFFERENTIAL (CANCER CENTER ONLY)
Abs Immature Granulocytes: 0.08 10*3/uL — ABNORMAL HIGH (ref 0.00–0.07)
BASOS ABS: 0 10*3/uL (ref 0.0–0.1)
Basophils Relative: 0 %
EOS ABS: 0.1 10*3/uL (ref 0.0–0.5)
EOS PCT: 1 %
HCT: 25.4 % — ABNORMAL LOW (ref 36.0–46.0)
Hemoglobin: 7.2 g/dL — ABNORMAL LOW (ref 12.0–15.0)
Immature Granulocytes: 1 %
Lymphocytes Relative: 6 %
Lymphs Abs: 0.5 10*3/uL — ABNORMAL LOW (ref 0.7–4.0)
MCH: 28.5 pg (ref 26.0–34.0)
MCHC: 28.3 g/dL — AB (ref 30.0–36.0)
MCV: 100.4 fL — ABNORMAL HIGH (ref 80.0–100.0)
MONO ABS: 0.7 10*3/uL (ref 0.1–1.0)
Monocytes Relative: 7 %
NRBC: 0 % (ref 0.0–0.2)
Neutro Abs: 7.9 10*3/uL — ABNORMAL HIGH (ref 1.7–7.7)
Neutrophils Relative %: 85 %
Platelet Count: 240 10*3/uL (ref 150–400)
RBC: 2.53 MIL/uL — AB (ref 3.87–5.11)
RDW: 17.7 % — ABNORMAL HIGH (ref 11.5–15.5)
WBC: 9.2 10*3/uL (ref 4.0–10.5)

## 2018-10-15 LAB — RETICULOCYTES
IMMATURE RETIC FRACT: 20.6 % — AB (ref 2.3–15.9)
RBC.: 2.53 MIL/uL — AB (ref 3.87–5.11)
Retic Count, Absolute: 97.4 10*3/uL (ref 19.0–186.0)
Retic Ct Pct: 3.9 % — ABNORMAL HIGH (ref 0.4–3.1)

## 2018-10-15 LAB — CMP (CANCER CENTER ONLY)
ALT: 20 U/L (ref 10–47)
AST: 22 U/L (ref 11–38)
Albumin: 3.1 g/dL — ABNORMAL LOW (ref 3.5–5.0)
Alkaline Phosphatase: 75 U/L (ref 26–84)
Anion gap: 11 (ref 5–15)
BUN: 50 mg/dL — ABNORMAL HIGH (ref 7–22)
CHLORIDE: 98 mmol/L (ref 98–108)
CO2: 32 mmol/L (ref 18–33)
Calcium: 9.2 mg/dL (ref 8.0–10.3)
Creatinine: 2 mg/dL — ABNORMAL HIGH (ref 0.60–1.20)
Glucose, Bld: 114 mg/dL (ref 73–118)
POTASSIUM: 6 mmol/L — AB (ref 3.3–4.7)
Sodium: 141 mmol/L (ref 128–145)
Total Bilirubin: 0.6 mg/dL (ref 0.2–1.6)
Total Protein: 6.9 g/dL (ref 6.4–8.1)

## 2018-10-15 LAB — SAMPLE TO BLOOD BANK

## 2018-10-15 LAB — PREPARE RBC (CROSSMATCH)

## 2018-10-15 MED ORDER — DARBEPOETIN ALFA 500 MCG/ML IJ SOSY
500.0000 ug | PREFILLED_SYRINGE | Freq: Once | INTRAMUSCULAR | Status: AC
  Administered 2018-10-15: 500 ug via SUBCUTANEOUS

## 2018-10-15 MED ORDER — DARBEPOETIN ALFA 500 MCG/ML IJ SOSY
PREFILLED_SYRINGE | INTRAMUSCULAR | Status: AC
  Filled 2018-10-15: qty 1

## 2018-10-15 NOTE — Patient Instructions (Signed)
Darbepoetin Alfa injection (Aranesp) What is this medicine? DARBEPOETIN ALFA (dar be POE e tin AL fa) helps your body make more red blood cells. It is used to treat anemia caused by chronic kidney failure and chemotherapy. This medicine may be used for other purposes; ask your health care provider or pharmacist if you have questions. COMMON BRAND NAME(S): Aranesp What should I tell my health care provider before I take this medicine? They need to know if you have any of these conditions: -blood clotting disorders or history of blood clots -cancer patient not on chemotherapy -cystic fibrosis -heart disease, such as angina, heart failure, or a history of a heart attack -hemoglobin level of 12 g/dL or greater -high blood pressure -low levels of folate, iron, or vitamin B12 -seizures -an unusual or allergic reaction to darbepoetin, erythropoietin, albumin, hamster proteins, latex, other medicines, foods, dyes, or preservatives -pregnant or trying to get pregnant -breast-feeding How should I use this medicine? This medicine is for injection into a vein or under the skin. It is usually given by a health care professional in a hospital or clinic setting. If you get this medicine at home, you will be taught how to prepare and give this medicine. Use exactly as directed. Take your medicine at regular intervals. Do not take your medicine more often than directed. It is important that you put your used needles and syringes in a special sharps container. Do not put them in a trash can. If you do not have a sharps container, call your pharmacist or healthcare provider to get one. A special MedGuide will be given to you by the pharmacist with each prescription and refill. Be sure to read this information carefully each time. Talk to your pediatrician regarding the use of this medicine in children. While this medicine may be used in children as young as 1 year for selected conditions, precautions do  apply. Overdosage: If you think you have taken too much of this medicine contact a poison control center or emergency room at once. NOTE: This medicine is only for you. Do not share this medicine with others. What if I miss a dose? If you miss a dose, take it as soon as you can. If it is almost time for your next dose, take only that dose. Do not take double or extra doses. What may interact with this medicine? Do not take this medicine with any of the following medications: -epoetin alfa This list may not describe all possible interactions. Give your health care provider a list of all the medicines, herbs, non-prescription drugs, or dietary supplements you use. Also tell them if you smoke, drink alcohol, or use illegal drugs. Some items may interact with your medicine. What should I watch for while using this medicine? Your condition will be monitored carefully while you are receiving this medicine. You may need blood work done while you are taking this medicine. What side effects may I notice from receiving this medicine? Side effects that you should report to your doctor or health care professional as soon as possible: -allergic reactions like skin rash, itching or hives, swelling of the face, lips, or tongue -breathing problems -changes in vision -chest pain -confusion, trouble speaking or understanding -feeling faint or lightheaded, falls -high blood pressure -muscle aches or pains -pain, swelling, warmth in the leg -rapid weight gain -severe headaches -sudden numbness or weakness of the face, arm or leg -trouble walking, dizziness, loss of balance or coordination -seizures (convulsions) -swelling of the ankles, feet,   hands -unusually weak or tired Side effects that usually do not require medical attention (report to your doctor or health care professional if they continue or are bothersome): -diarrhea -fever, chills (flu-like symptoms) -headaches -nausea, vomiting -redness,  stinging, or swelling at site where injected This list may not describe all possible side effects. Call your doctor for medical advice about side effects. You may report side effects to FDA at 1-800-FDA-1088. Where should I keep my medicine? Keep out of the reach of children. Store in a refrigerator between 2 and 8 degrees C (36 and 46 degrees F). Do not freeze. Do not shake. Throw away any unused portion if using a single-dose vial. Throw away any unused medicine after the expiration date. NOTE: This sheet is a summary. It may not cover all possible information. If you have questions about this medicine, talk to your doctor, pharmacist, or health care provider.  2018 Elsevier/Gold Standard (2016-07-03 19:52:26)  

## 2018-10-15 NOTE — Progress Notes (Signed)
Hematology and Oncology Follow Up Visit  Paige Hardin [id number] [birth year] 72 y.o. 10/15/2018   Principle Diagnosis:  Anemia secondary to iron deficiency  Anemia secondary to renal insufficiency  Chronic anticoagulation for paroxysmal atrial fibrillation  Current Therapy:   IV iron as indicated - last received in August 2019 Aranesp 500 mcg subcutaneous as needed for hemoglobin less than 11 --dose changed on 10/15/2018 ELIQUIS - life long   Interim History:  Paige Hardin is here today with her sister for follow-up.  This is likely she is was in the hospital after we see her.  She was just in the hospital about 4 weeks ago.  She apparently was in with heart failure.  She gained 21 pounds.  She is on chronic oxygen.  She has underlying cardiomyopathy.  We keep given her iron.  We  keep given her Aranesp.  I have to believe that she has chronic bleeding because she is on Eliquis.  This is really a big problem for her.  She is on quite a few medications.  She has insulin-dependent diabetes.  We are going to have to be much more aggressive I think in treating her in order to keep her blood count up.  As such, I think we are going to have to keep getting her back every 3 weeks for right now.  I just feel bad that we have to keep having her come in.  Unfortunately, I just do not see any other way around it given all of her morbidities that she has.  Her potassium is 6 today.  She is on supplemental potassium.  We told her to stop taking her supplemental potassium.  She has not noted any obvious bright red blood per rectum.  She has not noted any melena.  Overall, her performance status is ECOG 3.    Medications:  Allergies as of 10/15/2018      Reactions   Ace Inhibitors Cough   Statins Cough   Clindamycin Rash      Medication List        Accurate as of 10/15/18 12:47 PM. Always use your most recent med list.          acetaminophen 325 MG tablet Commonly known as:   TYLENOL Take 325 mg by mouth daily as needed for mild pain.   albuterol (2.5 MG/3ML) 0.083% nebulizer solution Commonly known as:  PROVENTIL Take 2.5 mg by nebulization every 6 (six) hours as needed for wheezing or shortness of breath.   albuterol 108 (90 Base) MCG/ACT inhaler Commonly known as:  PROVENTIL HFA;VENTOLIN HFA Inhale 2 puffs into the lungs every 6 (six) hours as needed for wheezing or shortness of breath.   allopurinol 100 MG tablet Commonly known as:  ZYLOPRIM Take 1 tablet (100 mg total) by mouth daily.   amiodarone 200 MG tablet Commonly known as:  PACERONE Take 1 tablet (200 mg total) by mouth daily.   ammonium lactate 12 % lotion Commonly known as:  LAC-HYDRIN Apply 1 application topically every 12 (twelve) hours. To both legs   apixaban 5 MG Tabs tablet Commonly known as:  ELIQUIS Take 1 tablet (5 mg total) by mouth 2 (two) times daily.   calcium-vitamin D 500-200 MG-UNIT tablet Commonly known as:  OSCAL WITH D Take 1 tablet by mouth daily with breakfast.   ezetimibe-simvastatin 10-40 MG tablet Commonly known as:  VYTORIN Take 1 tablet by mouth at bedtime.   guaiFENesin 600 MG 12 hr tablet Commonly known as:  MUCINEX Take 1 tablet (600 mg total) by mouth 2 (two) times daily.   insulin aspart 100 UNIT/ML injection Commonly known as:  novoLOG Inject 0-9 Units into the skin 3 (three) times daily with meals. Correction factor Sliding scale CBG 70 - 120: 0 units CBG 121 - 150: 1 unit,  CBG 151 - 200: 2 units,  CBG 201 - 250: 3 units,  CBG 251 - 300: 5 units,  CBG 301 - 350: 7 units,  CBG 351 - 400: 9 units   CBG > 400: 9 units and notify your MD   insulin NPH Human 100 UNIT/ML injection Commonly known as:  HUMULIN N,NOVOLIN N Inject 20-30 Units into the skin See admin instructions. Inject 20 units in the morning, and 30 units in the evening   levothyroxine 25 MCG tablet Commonly known as:  SYNTHROID, LEVOTHROID Take 1 tablet (25 mcg total) by mouth  daily before breakfast.   metoprolol tartrate 50 MG tablet Commonly known as:  LOPRESSOR Take 1 tablet (50 mg total) by mouth 2 (two) times daily.   MULTIVITAMIN/IRON PO Take 1 tablet by mouth daily.   omeprazole 20 MG capsule Commonly known as:  PRILOSEC Take 20 mg by mouth daily.   ondansetron 4 MG tablet Commonly known as:  ZOFRAN Take 4 mg by mouth every 6 (six) hours as needed for nausea or vomiting.   OXYGEN Inhale 4 L into the lungs continuous.   potassium chloride SA 20 MEQ tablet Commonly known as:  K-DUR,KLOR-CON Take 1 tablet (20 mEq total) by mouth every evening. **SCHEDULED**   potassium chloride SA 20 MEQ tablet Commonly known as:  K-DUR,KLOR-CON Take 2 tablets (40 mEq total) by mouth daily. **Take with scheduled 0700 dose to equal 40 MEQ every Wednesday**   predniSONE 10 MG tablet Commonly known as:  DELTASONE Take 30mg  ( three tabs ) on day one, then 20mg  ( 2tabs ) on day two, then 10mg  (1tab) on day three, then stop.   Tiotropium Bromide-Olodaterol 2.5-2.5 MCG/ACT Aers Inhale 2 puffs into the lungs daily.   torsemide 20 MG tablet Commonly known as:  DEMADEX Take 4 tablets (80 mg total) by mouth 2 (two) times daily.   vitamin B-12 500 MCG tablet Commonly known as:  CYANOCOBALAMIN Take 500 mcg by mouth daily.   Vitamin C 500 MG Caps Take 500 mg by mouth daily.   Vitamin D 50 MCG (2000 UT) tablet Take 2,000 Units by mouth daily.       Allergies:  Allergies  Allergen Reactions  . Ace Inhibitors Cough  . Statins Cough  . Clindamycin Rash    Past Medical History, Surgical history, Social history, and Family History were reviewed and updated.  Review of Systems: Review of Systems  Constitutional: Negative.   HENT: Negative.   Eyes: Negative.   Respiratory: Positive for shortness of breath.   Cardiovascular: Positive for palpitations.  Gastrointestinal: Negative.   Genitourinary: Negative.   Musculoskeletal: Negative.   Skin: Negative.    Neurological: Negative.   Endo/Heme/Allergies: Negative.   Psychiatric/Behavioral: Negative.      Physical Exam:  height is 5\' 5"  (1.651 m) and weight is 286 lb (129.7 kg). Her oral temperature is 98.8 F (37.1 C). Her blood pressure is 90/44 (abnormal) and her pulse is 67. Her respiration is 18 and oxygen saturation is 98%.   Wt Readings from Last 3 Encounters:  10/15/18 286 lb (129.7 kg)  09/14/18 291 lb 1.6 oz (132 kg)  08/20/18 (!) 301 lb  6.4 oz (136.7 kg)    Physical Exam  Constitutional: She is oriented to person, place, and time.  Obese [place] female with oxygen chronically.  She is chronically ill-appearing.  HENT:  Head: Normocephalic and atraumatic.  Mouth/Throat: Oropharynx is clear and moist.  Eyes: Pupils are equal, round, and reactive to light. EOM are normal.  Neck: Normal range of motion.  Cardiovascular: Normal rate, regular rhythm and normal heart sounds.  Regular rate and rhythm.  She has a 1/6 systolic ejection murmur.  Pulmonary/Chest: Effort normal and breath sounds normal.  Abdominal: Soft. Bowel sounds are normal.  Abdominal exam is morbidly obese.  She has no fluid wave.  There is no guarding or rebound tenderness.  There is no obvious hepatosplenomegaly.  Musculoskeletal: Normal range of motion. She exhibits no edema, tenderness or deformity.  Chronic pitting edema in her legs.  Lymphadenopathy:    She has no cervical adenopathy.  Neurological: She is alert and oriented to person, place, and time.  Skin: Skin is warm and dry. No rash noted. No erythema.  Psychiatric: She has a normal mood and affect. Her behavior is normal. Judgment and thought content normal.  Vitals reviewed.    Lab Results  Component Value Date   WBC 9.2 10/15/2018   HGB 7.2 (L) 10/15/2018   HCT 25.4 (L) 10/15/2018   MCV 100.4 (H) 10/15/2018   PLT 240 10/15/2018   Lab Results  Component Value Date   FERRITIN 314 (H) 08/20/2018   IRON 38 (L) 08/20/2018   TIBC 258  08/20/2018   UIBC 219 08/20/2018   IRONPCTSAT 15 (L) 08/20/2018   Lab Results  Component Value Date   RETICCTPCT 3.9 (H) 10/15/2018   RBC 2.53 (L) 10/15/2018   RBC 2.53 (L) 10/15/2018   RETICCTABS 89.0 09/21/2015   No results found for: KPAFRELGTCHN, LAMBDASER, KAPLAMBRATIO No results found for: IGGSERUM, IGA, IGMSERUM No results found for: Odetta Pink, SPEI   Chemistry      Component Value Date/Time   NA 141 10/15/2018 1101   NA 141 10/16/2017 1106   NA 140 07/31/2017 1028   K 6.0 (H) 10/15/2018 1101   K 3.8 10/16/2017 1106   K 4.2 07/31/2017 1028   CL 98 10/15/2018 1101   CL 92 (L) 10/16/2017 1106   CO2 32 10/15/2018 1101   CO2 31 10/16/2017 1106   CO2 35 (H) 07/31/2017 1028   BUN 50 (H) 10/15/2018 1101   BUN 40 (H) 10/16/2017 1106   BUN 42.7 (H) 07/31/2017 1028   CREATININE 2.00 (H) 10/15/2018 1101   CREATININE 1.7 (H) 10/16/2017 1106   CREATININE 1.5 (H) 07/31/2017 1028      Component Value Date/Time   CALCIUM 9.2 10/15/2018 1101   CALCIUM 9.5 10/16/2017 1106   CALCIUM 10.1 07/31/2017 1028   ALKPHOS 75 10/15/2018 1101   ALKPHOS 68 10/16/2017 1106   ALKPHOS 69 07/31/2017 1028   AST 22 10/15/2018 1101   AST 14 07/31/2017 1028   ALT 20 10/15/2018 1101   ALT 15 10/16/2017 1106   ALT 14 07/31/2017 1028   BILITOT 0.6 10/15/2018 1101   BILITOT 0.54 07/31/2017 1028      Impression and Plan: Ms. Nordlund is a very pleasant 72 yo caucasian female with both iron deficiency anemia and anemia of chronic renal failure.  I am going to have to get her in every 3 weeks.  I do not see any other way around it.  I just  think that she has a very limited capacity of blood production because of her medications and her diabetes and her chronic renal failure.  I also think that she loses blood because of chronic low-grade bleeding from the Eliquis.  We will transfuse her tomorrow.  We will give her 2 units of blood.  Again she  will stop her potassium.  We will get her back in 3 weeks.  Volanda Napoleon, MD 11/19/201912:47 PM

## 2018-10-16 ENCOUNTER — Inpatient Hospital Stay: Payer: Medicare Other

## 2018-10-16 DIAGNOSIS — E1122 Type 2 diabetes mellitus with diabetic chronic kidney disease: Secondary | ICD-10-CM | POA: Diagnosis not present

## 2018-10-16 DIAGNOSIS — I480 Paroxysmal atrial fibrillation: Secondary | ICD-10-CM | POA: Diagnosis not present

## 2018-10-16 DIAGNOSIS — D631 Anemia in chronic kidney disease: Secondary | ICD-10-CM | POA: Diagnosis not present

## 2018-10-16 DIAGNOSIS — I429 Cardiomyopathy, unspecified: Secondary | ICD-10-CM | POA: Diagnosis not present

## 2018-10-16 DIAGNOSIS — N183 Chronic kidney disease, stage 3 unspecified: Secondary | ICD-10-CM

## 2018-10-16 DIAGNOSIS — D509 Iron deficiency anemia, unspecified: Secondary | ICD-10-CM | POA: Diagnosis not present

## 2018-10-16 LAB — IRON AND TIBC
Iron: 41 ug/dL (ref 41–142)
Saturation Ratios: 16 % — ABNORMAL LOW (ref 21–57)
TIBC: 259 ug/dL (ref 236–444)
UIBC: 218 ug/dL (ref 120–384)

## 2018-10-16 LAB — FERRITIN: FERRITIN: 198 ng/mL (ref 11–307)

## 2018-10-16 MED ORDER — DIPHENHYDRAMINE HCL 25 MG PO TABS
25.0000 mg | ORAL_TABLET | Freq: Once | ORAL | Status: AC
  Administered 2018-10-16: 25 mg via ORAL
  Filled 2018-10-16: qty 1

## 2018-10-16 MED ORDER — FUROSEMIDE 10 MG/ML IJ SOLN
INTRAMUSCULAR | Status: AC
  Filled 2018-10-16: qty 4

## 2018-10-16 MED ORDER — FUROSEMIDE 10 MG/ML IJ SOLN
20.0000 mg | Freq: Once | INTRAMUSCULAR | Status: AC
  Administered 2018-10-16: 10 mg via INTRAVENOUS

## 2018-10-16 MED ORDER — SODIUM CHLORIDE 0.9% IV SOLUTION
250.0000 mL | Freq: Once | INTRAVENOUS | Status: AC
  Administered 2018-10-16: 250 mL via INTRAVENOUS
  Filled 2018-10-16: qty 250

## 2018-10-16 MED ORDER — ACETAMINOPHEN 325 MG PO TABS
650.0000 mg | ORAL_TABLET | Freq: Once | ORAL | Status: AC
  Administered 2018-10-16: 650 mg via ORAL

## 2018-10-16 MED ORDER — DIPHENHYDRAMINE HCL 25 MG PO CAPS
ORAL_CAPSULE | ORAL | Status: AC
  Filled 2018-10-16: qty 1

## 2018-10-16 MED ORDER — ACETAMINOPHEN 325 MG PO TABS
ORAL_TABLET | ORAL | Status: AC
  Filled 2018-10-16: qty 2

## 2018-10-17 LAB — TYPE AND SCREEN
ABO/RH(D): A POS
Antibody Screen: NEGATIVE
UNIT DIVISION: 0
Unit division: 0

## 2018-10-17 LAB — BPAM RBC
Blood Product Expiration Date: 201912122359
Blood Product Expiration Date: 201912122359
ISSUE DATE / TIME: 201911200738
ISSUE DATE / TIME: 201911200738
UNIT TYPE AND RH: 6200
Unit Type and Rh: 6200

## 2018-10-25 ENCOUNTER — Encounter: Payer: Self-pay | Admitting: Hematology & Oncology

## 2018-10-30 DIAGNOSIS — R2681 Unsteadiness on feet: Secondary | ICD-10-CM | POA: Diagnosis not present

## 2018-10-30 DIAGNOSIS — M6281 Muscle weakness (generalized): Secondary | ICD-10-CM | POA: Diagnosis not present

## 2018-10-31 DIAGNOSIS — M6281 Muscle weakness (generalized): Secondary | ICD-10-CM | POA: Diagnosis not present

## 2018-10-31 DIAGNOSIS — R2681 Unsteadiness on feet: Secondary | ICD-10-CM | POA: Diagnosis not present

## 2018-11-04 DIAGNOSIS — B351 Tinea unguium: Secondary | ICD-10-CM | POA: Diagnosis not present

## 2018-11-04 DIAGNOSIS — L84 Corns and callosities: Secondary | ICD-10-CM | POA: Diagnosis not present

## 2018-11-05 ENCOUNTER — Other Ambulatory Visit: Payer: Self-pay

## 2018-11-05 ENCOUNTER — Inpatient Hospital Stay: Payer: Medicare Other | Attending: Hematology & Oncology | Admitting: Hematology & Oncology

## 2018-11-05 ENCOUNTER — Inpatient Hospital Stay: Payer: Medicare Other

## 2018-11-05 ENCOUNTER — Encounter: Payer: Self-pay | Admitting: Hematology & Oncology

## 2018-11-05 ENCOUNTER — Telehealth: Payer: Self-pay | Admitting: Hematology & Oncology

## 2018-11-05 VITALS — BP 90/36 | HR 68 | Temp 98.7°F | Resp 19

## 2018-11-05 DIAGNOSIS — E119 Type 2 diabetes mellitus without complications: Secondary | ICD-10-CM

## 2018-11-05 DIAGNOSIS — D509 Iron deficiency anemia, unspecified: Secondary | ICD-10-CM | POA: Diagnosis not present

## 2018-11-05 DIAGNOSIS — Z7901 Long term (current) use of anticoagulants: Secondary | ICD-10-CM

## 2018-11-05 DIAGNOSIS — N183 Chronic kidney disease, stage 3 unspecified: Secondary | ICD-10-CM

## 2018-11-05 DIAGNOSIS — D631 Anemia in chronic kidney disease: Secondary | ICD-10-CM

## 2018-11-05 DIAGNOSIS — D508 Other iron deficiency anemias: Secondary | ICD-10-CM

## 2018-11-05 DIAGNOSIS — M7989 Other specified soft tissue disorders: Secondary | ICD-10-CM | POA: Diagnosis not present

## 2018-11-05 DIAGNOSIS — I480 Paroxysmal atrial fibrillation: Secondary | ICD-10-CM | POA: Diagnosis not present

## 2018-11-05 DIAGNOSIS — Z79899 Other long term (current) drug therapy: Secondary | ICD-10-CM | POA: Diagnosis not present

## 2018-11-05 DIAGNOSIS — R042 Hemoptysis: Secondary | ICD-10-CM

## 2018-11-05 DIAGNOSIS — Z794 Long term (current) use of insulin: Secondary | ICD-10-CM

## 2018-11-05 LAB — CMP (CANCER CENTER ONLY)
ALT: 15 U/L (ref 0–44)
ANION GAP: 9 (ref 5–15)
AST: 20 U/L (ref 15–41)
Albumin: 3.4 g/dL — ABNORMAL LOW (ref 3.5–5.0)
Alkaline Phosphatase: 65 U/L (ref 38–126)
BUN: 63 mg/dL — ABNORMAL HIGH (ref 8–23)
CALCIUM: 8.9 mg/dL (ref 8.9–10.3)
CO2: 40 mmol/L — ABNORMAL HIGH (ref 22–32)
Chloride: 88 mmol/L — ABNORMAL LOW (ref 98–111)
Creatinine: 2.11 mg/dL — ABNORMAL HIGH (ref 0.44–1.00)
GFR, EST AFRICAN AMERICAN: 26 mL/min — AB (ref >60)
GFR, EST NON AFRICAN AMERICAN: 23 mL/min — AB (ref >60)
GLUCOSE: 223 mg/dL — AB (ref 70–99)
Potassium: 3.4 mmol/L — ABNORMAL LOW (ref 3.5–5.1)
SODIUM: 137 mmol/L (ref 135–145)
TOTAL PROTEIN: 6.4 g/dL — AB (ref 6.5–8.1)
Total Bilirubin: 0.7 mg/dL (ref 0.3–1.2)

## 2018-11-05 LAB — CBC WITH DIFFERENTIAL (CANCER CENTER ONLY)
ABS IMMATURE GRANULOCYTES: 0.05 10*3/uL (ref 0.00–0.07)
BASOS PCT: 0 %
Basophils Absolute: 0 10*3/uL (ref 0.0–0.1)
EOS ABS: 0.1 10*3/uL (ref 0.0–0.5)
Eosinophils Relative: 2 %
HCT: 28.6 % — ABNORMAL LOW (ref 36.0–46.0)
Hemoglobin: 8.1 g/dL — ABNORMAL LOW (ref 12.0–15.0)
IMMATURE GRANULOCYTES: 1 %
Lymphocytes Relative: 4 %
Lymphs Abs: 0.4 10*3/uL — ABNORMAL LOW (ref 0.7–4.0)
MCH: 27.8 pg (ref 26.0–34.0)
MCHC: 28.3 g/dL — ABNORMAL LOW (ref 30.0–36.0)
MCV: 98.3 fL (ref 80.0–100.0)
Monocytes Absolute: 0.5 10*3/uL (ref 0.1–1.0)
Monocytes Relative: 6 %
NEUTROS ABS: 7.1 10*3/uL (ref 1.7–7.7)
NEUTROS PCT: 87 %
NRBC: 0 % (ref 0.0–0.2)
PLATELETS: 208 10*3/uL (ref 150–400)
RBC: 2.91 MIL/uL — ABNORMAL LOW (ref 3.87–5.11)
RDW: 17.5 % — AB (ref 11.5–15.5)
WBC Count: 8.1 10*3/uL (ref 4.0–10.5)

## 2018-11-05 LAB — IRON AND TIBC
Iron: 44 ug/dL (ref 41–142)
Saturation Ratios: 18 % — ABNORMAL LOW (ref 21–57)
TIBC: 239 ug/dL (ref 236–444)
UIBC: 195 ug/dL (ref 120–384)

## 2018-11-05 LAB — FERRITIN: FERRITIN: 236 ng/mL (ref 11–307)

## 2018-11-05 LAB — SAMPLE TO BLOOD BANK

## 2018-11-05 MED ORDER — DARBEPOETIN ALFA 500 MCG/ML IJ SOSY
PREFILLED_SYRINGE | INTRAMUSCULAR | Status: AC
  Filled 2018-11-05: qty 1

## 2018-11-05 MED ORDER — DARBEPOETIN ALFA 500 MCG/ML IJ SOSY
500.0000 ug | PREFILLED_SYRINGE | Freq: Once | INTRAMUSCULAR | Status: AC
  Administered 2018-11-05: 500 ug via SUBCUTANEOUS

## 2018-11-05 NOTE — Telephone Encounter (Signed)
Appointments scheduled calendar printed/ avs declined per 12/10 los

## 2018-11-05 NOTE — Progress Notes (Signed)
Hematology and Oncology Follow Up Visit  Paige Hardin [id number] August 18, 1946 72 y.o. 11/05/2018   Principle Diagnosis:  Anemia secondary to iron deficiency  Anemia secondary to renal insufficiency  Chronic anticoagulation for paroxysmal atrial fibrillation  Current Therapy:   IV iron as indicated - last received in August 2019 Aranesp 500 mcg subcutaneous as needed for hemoglobin less than 11 --dose changed on 10/15/2018 ELIQUIS - life long   Interim History:  Paige Hardin is here today with her sister for follow-up.   Unfortunately, I think we still having trouble with her hemoglobin.  I still suspect that she is bleeding.  She has renal insufficiency so she is not making any erythropoietin.  She is on Eliquis so she does have a risk factor for bleeding.  She said that she has been having some hemorrhoidal bleeding.  Problem is that she really is not in any physical shape for any type of surgery if we find that she has any type of GI blood loss.  She has so many health problems.  She is on so many medications.  Any kind of surgery I think would be very high risk.  Her iron levels have been on the low side.  Back in November, her ferritin was 198 with iron saturation of 16%.  She did have a nice Thanksgiving.  There is been no fever.  She is had no cough.  She does have some leg swelling which is chronic.  Currently, her performance status is ECOG 3.    Medications:  Allergies as of 11/05/2018      Reactions   Ace Inhibitors Cough   Statins Cough   Clindamycin Rash      Medication List        Accurate as of 11/05/18 10:57 AM. Always use your most recent med list.          acetaminophen 325 MG tablet Commonly known as:  TYLENOL Take 325 mg by mouth daily as needed for mild pain.   albuterol (2.5 MG/3ML) 0.083% nebulizer solution Commonly known as:  PROVENTIL Take 2.5 mg by nebulization every 6 (six) hours as needed for wheezing or shortness of breath.     albuterol 108 (90 Base) MCG/ACT inhaler Commonly known as:  PROVENTIL HFA;VENTOLIN HFA Inhale 2 puffs into the lungs every 6 (six) hours as needed for wheezing or shortness of breath.   allopurinol 100 MG tablet Commonly known as:  ZYLOPRIM Take 1 tablet (100 mg total) by mouth daily.   amiodarone 200 MG tablet Commonly known as:  PACERONE Take 1 tablet (200 mg total) by mouth daily.   ammonium lactate 12 % lotion Commonly known as:  LAC-HYDRIN Apply 1 application topically every 12 (twelve) hours. To both legs   apixaban 5 MG Tabs tablet Commonly known as:  ELIQUIS Take 1 tablet (5 mg total) by mouth 2 (two) times daily.   calcium-vitamin D 500-200 MG-UNIT tablet Commonly known as:  OSCAL WITH D Take 1 tablet by mouth daily with breakfast.   ezetimibe-simvastatin 10-40 MG tablet Commonly known as:  VYTORIN Take 1 tablet by mouth at bedtime.   guaiFENesin 600 MG 12 hr tablet Commonly known as:  MUCINEX Take 1 tablet (600 mg total) by mouth 2 (two) times daily.   insulin aspart 100 UNIT/ML injection Commonly known as:  novoLOG Inject 0-9 Units into the skin 3 (three) times daily with meals. Correction factor Sliding scale CBG 70 - 120: 0 units CBG 121 - 150: 1 unit,  CBG 151 - 200: 2 units,  CBG 201 - 250: 3 units,  CBG 251 - 300: 5 units,  CBG 301 - 350: 7 units,  CBG 351 - 400: 9 units   CBG > 400: 9 units and notify your MD   insulin NPH Human 100 UNIT/ML injection Commonly known as:  HUMULIN N,NOVOLIN N Inject 20-30 Units into the skin See admin instructions. Inject 20 units in the morning, and 30 units in the evening   levothyroxine 25 MCG tablet Commonly known as:  SYNTHROID, LEVOTHROID Take 1 tablet (25 mcg total) by mouth daily before breakfast.   metoprolol tartrate 50 MG tablet Commonly known as:  LOPRESSOR Take 1 tablet (50 mg total) by mouth 2 (two) times daily.   MULTIVITAMIN/IRON PO Take 1 tablet by mouth daily.   omeprazole 20 MG capsule Commonly  known as:  PRILOSEC Take 20 mg by mouth daily.   ondansetron 4 MG tablet Commonly known as:  ZOFRAN Take 4 mg by mouth every 6 (six) hours as needed for nausea or vomiting.   OXYGEN Inhale 4 L into the lungs continuous.   Tiotropium Bromide-Olodaterol 2.5-2.5 MCG/ACT Aers Inhale 2 puffs into the lungs daily.   torsemide 20 MG tablet Commonly known as:  DEMADEX Take 4 tablets (80 mg total) by mouth 2 (two) times daily.   vitamin B-12 500 MCG tablet Commonly known as:  CYANOCOBALAMIN Take 500 mcg by mouth daily.   Vitamin C 500 MG Caps Take 500 mg by mouth daily.   Vitamin D 50 MCG (2000 UT) tablet Take 2,000 Units by mouth daily.       Allergies:  Allergies  Allergen Reactions  . Ace Inhibitors Cough  . Statins Cough  . Clindamycin Rash    Past Medical History, Surgical history, Social history, and Family History were reviewed and updated.  Review of Systems: Review of Systems  Constitutional: Negative.   HENT: Negative.   Eyes: Negative.   Respiratory: Positive for shortness of breath.   Cardiovascular: Positive for palpitations.  Gastrointestinal: Negative.   Genitourinary: Negative.   Musculoskeletal: Negative.   Skin: Negative.   Neurological: Negative.   Endo/Heme/Allergies: Negative.   Psychiatric/Behavioral: Negative.      Physical Exam:  oral temperature is 98.7 F (37.1 C). Her blood pressure is 90/36 (abnormal) and her pulse is 68. Her respiration is 19 and oxygen saturation is 98%.   Wt Readings from Last 3 Encounters:  10/15/18 286 lb (129.7 kg)  09/14/18 291 lb 1.6 oz (132 kg)  08/20/18 (!) 301 lb 6.4 oz (136.7 kg)    Physical Exam  Constitutional: She is oriented to person, place, and time.  Obese Paige Hardin female with oxygen chronically.  She is chronically ill-appearing.  HENT:  Head: Normocephalic and atraumatic.  Mouth/Throat: Oropharynx is clear and moist.  Eyes: Pupils are equal, round, and reactive to light. EOM are normal.    Neck: Normal range of motion.  Cardiovascular: Normal rate, regular rhythm and normal heart sounds.  Regular rate and rhythm.  She has a 1/6 systolic ejection murmur.  Pulmonary/Chest: Effort normal and breath sounds normal.  Abdominal: Soft. Bowel sounds are normal.  Abdominal exam is morbidly obese.  She has no fluid wave.  There is no guarding or rebound tenderness.  There is no obvious hepatosplenomegaly.  Musculoskeletal: Normal range of motion. She exhibits no edema, tenderness or deformity.  Chronic pitting edema in her legs.  Lymphadenopathy:    She has no cervical adenopathy.  Neurological:  She is alert and oriented to person, place, and time.  Skin: Skin is warm and dry. No rash noted. No erythema.  Psychiatric: She has a normal mood and affect. Her behavior is normal. Judgment and thought content normal.  Vitals reviewed.    Lab Results  Component Value Date   WBC 8.1 11/05/2018   HGB 8.1 (L) 11/05/2018   HCT 28.6 (L) 11/05/2018   MCV 98.3 11/05/2018   PLT 208 11/05/2018   Lab Results  Component Value Date   FERRITIN 198 10/15/2018   IRON 41 10/15/2018   TIBC 259 10/15/2018   UIBC 218 10/15/2018   IRONPCTSAT 16 (L) 10/15/2018   Lab Results  Component Value Date   RETICCTPCT 3.9 (H) 10/15/2018   RBC 2.91 (L) 11/05/2018   RETICCTABS 89.0 09/21/2015   No results found for: KPAFRELGTCHN, LAMBDASER, KAPLAMBRATIO No results found for: IGGSERUM, IGA, IGMSERUM No results found for: Odetta Pink, SPEI   Chemistry      Component Value Date/Time   NA 137 11/05/2018 0906   NA 141 10/16/2017 1106   NA 140 07/31/2017 1028   K 3.4 (L) 11/05/2018 0906   K 3.8 10/16/2017 1106   K 4.2 07/31/2017 1028   CL 88 (L) 11/05/2018 0906   CL 92 (L) 10/16/2017 1106   CO2 40 (H) 11/05/2018 0906   CO2 31 10/16/2017 1106   CO2 35 (H) 07/31/2017 1028   BUN 63 (H) 11/05/2018 0906   BUN 40 (H) 10/16/2017 1106   BUN 42.7 (H)  07/31/2017 1028   CREATININE 2.11 (H) 11/05/2018 0906   CREATININE 1.7 (H) 10/16/2017 1106   CREATININE 1.5 (H) 07/31/2017 1028      Component Value Date/Time   CALCIUM 8.9 11/05/2018 0906   CALCIUM 9.5 10/16/2017 1106   CALCIUM 10.1 07/31/2017 1028   ALKPHOS 65 11/05/2018 0906   ALKPHOS 68 10/16/2017 1106   ALKPHOS 69 07/31/2017 1028   AST 20 11/05/2018 0906   AST 14 07/31/2017 1028   ALT 15 11/05/2018 0906   ALT 15 10/16/2017 1106   ALT 14 07/31/2017 1028   BILITOT 0.7 11/05/2018 0906   BILITOT 0.54 07/31/2017 1028      Impression and Plan: Ms. Trick is a very pleasant 72 yo caucasian female with both iron deficiency anemia and anemia of chronic renal failure.  Again, we just are really not able to be aggressive here.  She does has some many health issues that she is not capable of undergoing a lot of studies and would never be able to have surgery.  I really think she has to be transfused.  She cannot do it this week.  We will try to get her in next Tuesday for a transfusion.  It is incredibly difficult to get her into the office.  Even though [facility] in a retirement center, they do not have a lot of resources to get her to the office.  Her sister asked to bring her in this is becoming more challenging.  She will get Aranesp today.  She will come back in a week and we will do a iron infusion and then give her some blood.  I really would like to get her blood up for Christmas and New Year's so she will feel okay and not end up in the hospital.  We will get her back in 3 weeks.  I really would love to see her hemoglobin above 10 for the start of  the new decade.  Volanda Napoleon, MD 12/10/201910:57 AM

## 2018-11-05 NOTE — Patient Instructions (Signed)

## 2018-11-11 ENCOUNTER — Other Ambulatory Visit: Payer: Self-pay

## 2018-11-11 ENCOUNTER — Inpatient Hospital Stay (HOSPITAL_COMMUNITY)
Admission: EM | Admit: 2018-11-11 | Discharge: 2018-11-13 | DRG: 291 | Payer: Medicare Other | Attending: Internal Medicine | Admitting: Internal Medicine

## 2018-11-11 ENCOUNTER — Telehealth: Payer: Self-pay | Admitting: *Deleted

## 2018-11-11 ENCOUNTER — Emergency Department (HOSPITAL_COMMUNITY): Payer: Medicare Other

## 2018-11-11 ENCOUNTER — Encounter (HOSPITAL_COMMUNITY): Payer: Self-pay | Admitting: Emergency Medicine

## 2018-11-11 DIAGNOSIS — Z66 Do not resuscitate: Secondary | ICD-10-CM | POA: Diagnosis present

## 2018-11-11 DIAGNOSIS — R0902 Hypoxemia: Secondary | ICD-10-CM | POA: Diagnosis not present

## 2018-11-11 DIAGNOSIS — I4510 Unspecified right bundle-branch block: Secondary | ICD-10-CM | POA: Diagnosis present

## 2018-11-11 DIAGNOSIS — Z8701 Personal history of pneumonia (recurrent): Secondary | ICD-10-CM

## 2018-11-11 DIAGNOSIS — R0603 Acute respiratory distress: Secondary | ICD-10-CM | POA: Diagnosis not present

## 2018-11-11 DIAGNOSIS — F329 Major depressive disorder, single episode, unspecified: Secondary | ICD-10-CM | POA: Diagnosis present

## 2018-11-11 DIAGNOSIS — D509 Iron deficiency anemia, unspecified: Secondary | ICD-10-CM | POA: Diagnosis present

## 2018-11-11 DIAGNOSIS — N184 Chronic kidney disease, stage 4 (severe): Secondary | ICD-10-CM | POA: Diagnosis not present

## 2018-11-11 DIAGNOSIS — K219 Gastro-esophageal reflux disease without esophagitis: Secondary | ICD-10-CM | POA: Diagnosis present

## 2018-11-11 DIAGNOSIS — I480 Paroxysmal atrial fibrillation: Secondary | ICD-10-CM | POA: Diagnosis present

## 2018-11-11 DIAGNOSIS — R0602 Shortness of breath: Secondary | ICD-10-CM | POA: Diagnosis not present

## 2018-11-11 DIAGNOSIS — E119 Type 2 diabetes mellitus without complications: Secondary | ICD-10-CM

## 2018-11-11 DIAGNOSIS — Z825 Family history of asthma and other chronic lower respiratory diseases: Secondary | ICD-10-CM

## 2018-11-11 DIAGNOSIS — Z6841 Body Mass Index (BMI) 40.0 and over, adult: Secondary | ICD-10-CM | POA: Diagnosis not present

## 2018-11-11 DIAGNOSIS — I509 Heart failure, unspecified: Secondary | ICD-10-CM | POA: Diagnosis not present

## 2018-11-11 DIAGNOSIS — I4891 Unspecified atrial fibrillation: Secondary | ICD-10-CM | POA: Diagnosis not present

## 2018-11-11 DIAGNOSIS — N183 Chronic kidney disease, stage 3 unspecified: Secondary | ICD-10-CM

## 2018-11-11 DIAGNOSIS — I959 Hypotension, unspecified: Secondary | ICD-10-CM | POA: Diagnosis not present

## 2018-11-11 DIAGNOSIS — E039 Hypothyroidism, unspecified: Secondary | ICD-10-CM | POA: Diagnosis present

## 2018-11-11 DIAGNOSIS — Z803 Family history of malignant neoplasm of breast: Secondary | ICD-10-CM

## 2018-11-11 DIAGNOSIS — Z7901 Long term (current) use of anticoagulants: Secondary | ICD-10-CM

## 2018-11-11 DIAGNOSIS — E1122 Type 2 diabetes mellitus with diabetic chronic kidney disease: Secondary | ICD-10-CM | POA: Diagnosis present

## 2018-11-11 DIAGNOSIS — E785 Hyperlipidemia, unspecified: Secondary | ICD-10-CM | POA: Diagnosis present

## 2018-11-11 DIAGNOSIS — I110 Hypertensive heart disease with heart failure: Secondary | ICD-10-CM | POA: Diagnosis not present

## 2018-11-11 DIAGNOSIS — I5033 Acute on chronic diastolic (congestive) heart failure: Secondary | ICD-10-CM | POA: Diagnosis not present

## 2018-11-11 DIAGNOSIS — G8929 Other chronic pain: Secondary | ICD-10-CM | POA: Diagnosis present

## 2018-11-11 DIAGNOSIS — I10 Essential (primary) hypertension: Secondary | ICD-10-CM | POA: Diagnosis present

## 2018-11-11 DIAGNOSIS — Z79899 Other long term (current) drug therapy: Secondary | ICD-10-CM

## 2018-11-11 DIAGNOSIS — I878 Other specified disorders of veins: Secondary | ICD-10-CM | POA: Diagnosis present

## 2018-11-11 DIAGNOSIS — I130 Hypertensive heart and chronic kidney disease with heart failure and stage 1 through stage 4 chronic kidney disease, or unspecified chronic kidney disease: Principal | ICD-10-CM | POA: Diagnosis present

## 2018-11-11 DIAGNOSIS — N179 Acute kidney failure, unspecified: Secondary | ICD-10-CM | POA: Diagnosis present

## 2018-11-11 DIAGNOSIS — D631 Anemia in chronic kidney disease: Secondary | ICD-10-CM

## 2018-11-11 DIAGNOSIS — J439 Emphysema, unspecified: Secondary | ICD-10-CM | POA: Diagnosis present

## 2018-11-11 DIAGNOSIS — Z794 Long term (current) use of insulin: Secondary | ICD-10-CM

## 2018-11-11 LAB — CBC WITH DIFFERENTIAL/PLATELET
Abs Immature Granulocytes: 0.05 10*3/uL (ref 0.00–0.07)
BASOS PCT: 0 %
Basophils Absolute: 0 10*3/uL (ref 0.0–0.1)
EOS ABS: 0.1 10*3/uL (ref 0.0–0.5)
Eosinophils Relative: 1 %
HCT: 28.8 % — ABNORMAL LOW (ref 36.0–46.0)
Hemoglobin: 8 g/dL — ABNORMAL LOW (ref 12.0–15.0)
Immature Granulocytes: 0 %
Lymphocytes Relative: 3 %
Lymphs Abs: 0.4 10*3/uL — ABNORMAL LOW (ref 0.7–4.0)
MCH: 27.2 pg (ref 26.0–34.0)
MCHC: 27.8 g/dL — ABNORMAL LOW (ref 30.0–36.0)
MCV: 98 fL (ref 80.0–100.0)
Monocytes Absolute: 0.8 10*3/uL (ref 0.1–1.0)
Monocytes Relative: 7 %
Neutro Abs: 10.7 10*3/uL — ABNORMAL HIGH (ref 1.7–7.7)
Neutrophils Relative %: 89 %
PLATELETS: 246 10*3/uL (ref 150–400)
RBC: 2.94 MIL/uL — AB (ref 3.87–5.11)
RDW: 18.5 % — ABNORMAL HIGH (ref 11.5–15.5)
WBC: 12 10*3/uL — ABNORMAL HIGH (ref 4.0–10.5)
nRBC: 0 % (ref 0.0–0.2)

## 2018-11-11 LAB — MAGNESIUM: Magnesium: 2 mg/dL (ref 1.7–2.4)

## 2018-11-11 LAB — COMPREHENSIVE METABOLIC PANEL
ALT: 17 U/L (ref 0–44)
AST: 19 U/L (ref 15–41)
Albumin: 2.8 g/dL — ABNORMAL LOW (ref 3.5–5.0)
Alkaline Phosphatase: 55 U/L (ref 38–126)
Anion gap: 10 (ref 5–15)
BUN: 54 mg/dL — ABNORMAL HIGH (ref 8–23)
CALCIUM: 9 mg/dL (ref 8.9–10.3)
CO2: 42 mmol/L — ABNORMAL HIGH (ref 22–32)
Chloride: 86 mmol/L — ABNORMAL LOW (ref 98–111)
Creatinine, Ser: 1.83 mg/dL — ABNORMAL HIGH (ref 0.44–1.00)
GFR calc Af Amer: 31 mL/min — ABNORMAL LOW (ref >60)
GFR, EST NON AFRICAN AMERICAN: 27 mL/min — AB (ref >60)
Glucose, Bld: 98 mg/dL (ref 70–99)
Potassium: 3.2 mmol/L — ABNORMAL LOW (ref 3.5–5.1)
Sodium: 138 mmol/L (ref 135–145)
Total Bilirubin: 0.9 mg/dL (ref 0.3–1.2)
Total Protein: 6.4 g/dL — ABNORMAL LOW (ref 6.5–8.1)

## 2018-11-11 LAB — I-STAT TROPONIN, ED: Troponin i, poc: 0.02 ng/mL (ref 0.00–0.08)

## 2018-11-11 LAB — GLUCOSE, CAPILLARY
Glucose-Capillary: 134 mg/dL — ABNORMAL HIGH (ref 70–99)
Glucose-Capillary: 124 mg/dL — ABNORMAL HIGH (ref 70–99)

## 2018-11-11 LAB — BRAIN NATRIURETIC PEPTIDE: B Natriuretic Peptide: 807.9 pg/mL — ABNORMAL HIGH (ref 0.0–100.0)

## 2018-11-11 LAB — MRSA PCR SCREENING: MRSA BY PCR: NEGATIVE

## 2018-11-11 LAB — PREPARE RBC (CROSSMATCH)

## 2018-11-11 MED ORDER — SODIUM CHLORIDE 0.9% FLUSH: 10.0000 mL | INTRAVENOUS | Status: DC | PRN

## 2018-11-11 MED ORDER — AMIODARONE HCL 200 MG PO TABS
200.0000 mg | ORAL_TABLET | Freq: Every day | ORAL | Status: DC
  Administered 2018-11-12 – 2018-11-13 (×2): 200 mg via ORAL
  Filled 2018-11-11 (×2): qty 1

## 2018-11-11 MED ORDER — SODIUM CHLORIDE 0.9% FLUSH: 3.0000 mL | INTRAVENOUS | Status: DC | PRN

## 2018-11-11 MED ORDER — FUROSEMIDE 10 MG/ML IJ SOLN: 40.0000 mg | Freq: Once | INTRAMUSCULAR | Status: DC

## 2018-11-11 MED ORDER — POTASSIUM CHLORIDE 10 MEQ/100ML IV SOLN
10.0000 meq | Freq: Once | INTRAVENOUS | Status: AC
  Administered 2018-11-11: 10 meq via INTRAVENOUS
  Filled 2018-11-11: qty 100

## 2018-11-11 MED ORDER — GUAIFENESIN ER 600 MG PO TB12
600.0000 mg | ORAL_TABLET | Freq: Two times a day (BID) | ORAL | Status: DC
  Administered 2018-11-11 – 2018-11-13 (×4): 600 mg via ORAL
  Filled 2018-11-11 (×6): qty 1

## 2018-11-11 MED ORDER — HEPARIN SOD (PORK) LOCK FLUSH 100 UNIT/ML IV SOLN: 250.0000 [IU] | INTRAVENOUS | Status: DC | PRN

## 2018-11-11 MED ORDER — POTASSIUM CHLORIDE CRYS ER 20 MEQ PO TBCR
40.0000 meq | EXTENDED_RELEASE_TABLET | Freq: Every day | ORAL | Status: DC
  Administered 2018-11-11 – 2018-11-13 (×3): 40 meq via ORAL
  Filled 2018-11-11 (×3): qty 2

## 2018-11-11 MED ORDER — FUROSEMIDE 10 MG/ML IJ SOLN
40.0000 mg | Freq: Two times a day (BID) | INTRAMUSCULAR | Status: DC
  Administered 2018-11-11 – 2018-11-13 (×4): 40 mg via INTRAVENOUS
  Filled 2018-11-11 (×5): qty 4

## 2018-11-11 MED ORDER — PANTOPRAZOLE SODIUM 40 MG PO TBEC
40.0000 mg | DELAYED_RELEASE_TABLET | Freq: Every day | ORAL | Status: DC
  Administered 2018-11-12 – 2018-11-13 (×2): 40 mg via ORAL
  Filled 2018-11-11 (×2): qty 1

## 2018-11-11 MED ORDER — INSULIN NPH (HUMAN) (ISOPHANE) 100 UNIT/ML ~~LOC~~ SUSP
30.0000 [IU] | Freq: Every day | SUBCUTANEOUS | Status: DC
  Administered 2018-11-11 – 2018-11-12 (×2): 30 [IU] via SUBCUTANEOUS
  Filled 2018-11-11: qty 10

## 2018-11-11 MED ORDER — IPRATROPIUM-ALBUTEROL 0.5-2.5 (3) MG/3ML IN SOLN: 3.0000 mL | Freq: Once | RESPIRATORY_TRACT | Status: DC

## 2018-11-11 MED ORDER — FUROSEMIDE 10 MG/ML IJ SOLN: 40.0000 mg | Freq: Every day | INTRAMUSCULAR | Status: DC

## 2018-11-11 MED ORDER — SODIUM CHLORIDE 0.9% IV SOLUTION: 250.0000 mL | Freq: Once | INTRAVENOUS | Status: DC

## 2018-11-11 MED ORDER — HEPARIN SOD (PORK) LOCK FLUSH 100 UNIT/ML IV SOLN: 500.0000 [IU] | Freq: Every day | INTRAVENOUS | Status: DC | PRN

## 2018-11-11 MED ORDER — INSULIN NPH (HUMAN) (ISOPHANE) 100 UNIT/ML ~~LOC~~ SUSP: 20.0000 [IU] | SUBCUTANEOUS | Status: DC

## 2018-11-11 MED ORDER — FUROSEMIDE 10 MG/ML IJ SOLN: 20.0000 mg | Freq: Once | INTRAMUSCULAR | Status: DC

## 2018-11-11 MED ORDER — APIXABAN 5 MG PO TABS
5.0000 mg | ORAL_TABLET | Freq: Two times a day (BID) | ORAL | Status: DC
  Administered 2018-11-11 – 2018-11-13 (×4): 5 mg via ORAL
  Filled 2018-11-11 (×4): qty 1

## 2018-11-11 MED ORDER — FUROSEMIDE 10 MG/ML IJ SOLN
40.0000 mg | Freq: Once | INTRAMUSCULAR | Status: AC
  Administered 2018-11-11: 40 mg via INTRAVENOUS
  Filled 2018-11-11: qty 4

## 2018-11-11 MED ORDER — SODIUM CHLORIDE 0.9% IV SOLUTION
Freq: Once | INTRAVENOUS | Status: AC
  Administered 2018-11-11: 18:00:00 via INTRAVENOUS

## 2018-11-11 MED ORDER — METOPROLOL TARTRATE 50 MG PO TABS
50.0000 mg | ORAL_TABLET | Freq: Two times a day (BID) | ORAL | Status: DC
  Administered 2018-11-11 – 2018-11-13 (×4): 50 mg via ORAL
  Filled 2018-11-11 (×4): qty 1

## 2018-11-11 MED ORDER — ALBUTEROL SULFATE (2.5 MG/3ML) 0.083% IN NEBU: 2.5000 mg | INHALATION_SOLUTION | Freq: Four times a day (QID) | RESPIRATORY_TRACT | Status: DC | PRN

## 2018-11-11 MED ORDER — INSULIN ASPART 100 UNIT/ML ~~LOC~~ SOLN
0.0000 [IU] | Freq: Three times a day (TID) | SUBCUTANEOUS | Status: DC
  Administered 2018-11-11 – 2018-11-12 (×2): 3 [IU] via SUBCUTANEOUS
  Administered 2018-11-12: 4 [IU] via SUBCUTANEOUS
  Administered 2018-11-13 (×2): 3 [IU] via SUBCUTANEOUS

## 2018-11-11 MED ORDER — LEVOTHYROXINE SODIUM 50 MCG PO TABS
50.0000 ug | ORAL_TABLET | Freq: Every day | ORAL | Status: DC
  Administered 2018-11-12 – 2018-11-13 (×2): 50 ug via ORAL
  Filled 2018-11-11 (×2): qty 1

## 2018-11-11 MED ORDER — ACETAMINOPHEN 650 MG RE SUPP: 650.0000 mg | Freq: Four times a day (QID) | RECTAL | Status: DC | PRN

## 2018-11-11 MED ORDER — METOLAZONE 2.5 MG PO TABS
2.5000 mg | ORAL_TABLET | ORAL | Status: DC
  Administered 2018-11-13: 2.5 mg via ORAL
  Filled 2018-11-11: qty 1

## 2018-11-11 MED ORDER — ALLOPURINOL 100 MG PO TABS
100.0000 mg | ORAL_TABLET | Freq: Every day | ORAL | Status: DC
  Administered 2018-11-12 – 2018-11-13 (×2): 100 mg via ORAL
  Filled 2018-11-11 (×2): qty 1

## 2018-11-11 MED ORDER — EZETIMIBE-SIMVASTATIN 10-40 MG PO TABS
1.0000 | ORAL_TABLET | Freq: Every day | ORAL | Status: DC
  Administered 2018-11-11 – 2018-11-12 (×2): 1 via ORAL
  Filled 2018-11-11 (×2): qty 1

## 2018-11-11 MED ORDER — INSULIN NPH (HUMAN) (ISOPHANE) 100 UNIT/ML ~~LOC~~ SUSP
20.0000 [IU] | Freq: Every day | SUBCUTANEOUS | Status: DC
  Administered 2018-11-12 – 2018-11-13 (×2): 20 [IU] via SUBCUTANEOUS

## 2018-11-11 MED ORDER — ACETAMINOPHEN 325 MG PO TABS: 650.0000 mg | ORAL_TABLET | Freq: Four times a day (QID) | ORAL | Status: DC | PRN

## 2018-11-11 NOTE — Progress Notes (Signed)
   11/11/18 2145  Vitals  Vital Signs Type (Include Temp, Pulse, RR, and B/P) 15 min. post blood start  Temp 99.3 F (37.4 C)  Temp Source Oral  Pulse Rate 79  Resp 18  BP 103/70  Oxygen Therapy  SpO2 93 %  O2 Device Nasal Cannula  O2 Flow Rate (L/min) 4 L/min  Intake (mL)  NS for Blood Administration 100 mL  Blood transfusion completed. @ 2145 [place] resting in bed. No complaints at this time.

## 2018-11-11 NOTE — Care Management Note (Signed)
Case Management Note  Patient Details  Name: Paige Hardin MRN: [id number] Date of Birth: 08-Apr-1946  Subjective/Objective:     Respiratory Distress              Action/Plan: Patient is from [facility]; SW to follow to transition patient back to facility when medically stable; CM will continue to follow for progression of care.  Expected Discharge Date:   possibly 11/16/2018               Expected Discharge Plan:  [facility] Home/ ALF  In-House Referral:  Clinical Social Work  Discharge planning Services  CM Consult  Status of Service:  In process, will continue to follow  Sherrilyn Rist [phone] 11/11/2018, 3:56 PM

## 2018-11-11 NOTE — Telephone Encounter (Signed)
Received call from [facility] stating,"we are sending patient out of the facility for respiratory failure. Please cancel appointments for tomorrow. Thank you." Appointments cancelled and blood bank notified.

## 2018-11-11 NOTE — ED Provider Notes (Signed)
[facility]ERGENCY DEPARTMENT Provider Note   CSN: [id number] Arrival date & time: 11/11/18  1049     History   Chief Complaint Chief Complaint  Patient presents with  . Respiratory Distress    HPI Paige Hardin is a 72 y.o. female.  Patient is a 72 year old female with history of COPD, CHF, obesity, paroxysmal A. fib, diabetes, and venous stasis.  She presents today from her assisted living facility for evaluation of shortness of breath.  This has worsened over the past several days.  She states that she is having difficulty keeping her oxygen levels up even on her usual 4 L by nasal cannula.  She denies any fevers, chills, or productive cough.  He does have a history of anemia and receives intermittent blood transfusions.  She says that her hemoglobin was recently found to be 8.1 and is due to be transfused tomorrow.  The history is provided by the patient.    Past Medical History:  Diagnosis Date  . Arthritis    "joints" (01/14/2014)  . Carotid atherosclerosis   . Chronic low back pain   . COPD (chronic obstructive pulmonary disease) ([place])   . Depression    "maybe" (01/14/2014  . Diastolic congestive heart failure ([place])   . Emphysema   . Excessive daytime sleepiness 09/08/2015  . GERD (gastroesophageal reflux disease)   . Hoarseness, chronic   . Hyperlipidemia   . Lung nodules   . Obese   . Paroxysmal atrial fibrillation (HCC)    a. failed flecainide;  b. 03/2014 amio started->DCCV;  c. Chronic pradaxa.  . Pneumonia 1990's   "once"  . Type 2 diabetes mellitus [facility])     Patient Active Problem List   Diagnosis Date Noted  . Pressure injury of skin 09/14/2018  . CHF exacerbation ([place]) 09/07/2018  . CKD (chronic kidney disease), stage IV ([place]) 09/07/2018  . GI bleeding 05/21/2018  . Anemia of chronic renal failure, stage 3 (moderate) ([place]) 03/27/2017  . CHF (congestive heart failure) ([place]) 10/17/2016  . UTI (urinary tract infection) 10/17/2016    . Solitary pulmonary nodule 12/23/2015  . Type 2 diabetes mellitus with hyperglycemia ([place]) 11/16/2015  . Hyperlipidemia 11/16/2015  . Excessive daytime sleepiness 09/08/2015  . Iron deficiency anemia 03/29/2014  . Prolonged QT interval 03/01/2014  . Altered mental status 02/24/2014  . HTN (hypertension) 01/21/2014  . Chronic anticoagulation 01/21/2014  . Pulmonary HTN- pa 53 mmHg Echo 01/15/14 01/21/2014  . Morbid obesity- BMI 50 01/21/2014  . Poorly controlled diabetes mellitus ([place]) 01/21/2014  . Chronic renal disease, stage III ([place]) 01/21/2014  . Chronic diastolic heart failure ([place]) 01/14/2014  . CHEST PAIN 09/26/2010  . HYPERLIPIDEMIA-MIXED 05/14/2009  . HOARSENESS 05/14/2009  . CAROTID BRUIT 05/14/2009  . COPD with emphysema  05/05/2009  . PAF (paroxysmal atrial fibrillation) s/p multiple DCCVs; on Amiodarone 05/05/2009    Past Surgical History:  Procedure Laterality Date  . BREAST BIOPSY Left ~ 1980   "solid"  . BREAST LUMPECTOMY Left ~ 1980   "removed a mass; it was benign" (01/14/2014)  . CARDIOVERSION N/A 03/11/2014   Procedure: CARDIOVERSION;  Surgeon: Sanda Klein, MD;  Location: [facility];  Service: Cardiovascular;  Laterality: N/A;  . CARDIOVERSION N/A 03/27/2014   Procedure: CARDIOVERSION;  Surgeon: Darlin Coco, MD;  Location: [facility];  Service: Cardiovascular;  Laterality: N/A;  . COLONOSCOPY  01/26/2012   Procedure: COLONOSCOPY;  Surgeon: Beryle Beams, MD;  Location: [facility];  Service: Endoscopy;  Laterality: N/A;  .  ESOPHAGOGASTRODUODENOSCOPY  01/26/2012   Procedure: ESOPHAGOGASTRODUODENOSCOPY (EGD);  Surgeon: Beryle Beams, MD;  Location: [facility];  Service: Endoscopy;  Laterality: N/A;  . TOTAL ABDOMINAL HYSTERECTOMY  1994     OB History   No obstetric history on file.      Home Medications    Prior to Admission medications   Medication Sig Start Date End Date Taking? Authorizing Provider  acetaminophen (TYLENOL) 325 MG tablet Take 325 mg  by mouth daily as needed for mild pain.     [provider]  albuterol (PROVENTIL HFA;VENTOLIN HFA) 108 (90 Base) MCG/ACT inhaler Inhale 2 puffs into the lungs every 6 (six) hours as needed for wheezing or shortness of breath.    [provider]  albuterol (PROVENTIL) (2.5 MG/3ML) 0.083% nebulizer solution Take 2.5 mg by nebulization every 6 (six) hours as needed for wheezing or shortness of breath.    [provider]  allopurinol (ZYLOPRIM) 100 MG tablet Take 1 tablet (100 mg total) by mouth daily. 10/16/17   [place], Holli Humbles, NP  amiodarone (PACERONE) 200 MG tablet Take 1 tablet (200 mg total) by mouth daily. 04/23/14   Richardson Dopp T, PA-C  ammonium lactate (LAC-HYDRIN) 12 % lotion Apply 1 application topically every 12 (twelve) hours. To both legs 10/02/18   [provider]  apixaban (ELIQUIS) 5 MG TABS tablet Take 1 tablet (5 mg total) by mouth 2 (two) times daily. 04/03/14   [place], Grace Bushy, NP  Ascorbic Acid (VITAMIN C) 500 MG CAPS Take 500 mg by mouth daily.     [provider]  calcium-vitamin D (OSCAL WITH D) 500-200 MG-UNIT per tablet Take 1 tablet by mouth daily with breakfast.    [provider]  Cholecalciferol (VITAMIN D) 2000 UNITS tablet Take 2,000 Units by mouth daily.    [provider]  ezetimibe-simvastatin (VYTORIN) 10-40 MG per tablet Take 1 tablet by mouth at bedtime.     [provider]  guaiFENesin (MUCINEX) 600 MG 12 hr tablet Take 1 tablet (600 mg total) by mouth 2 (two) times daily. 09/14/18   Florencia Reasons, MD  insulin aspart (NOVOLOG) 100 UNIT/ML injection Inject 0-9 Units into the skin 3 (three) times daily with meals. Correction factor Sliding scale CBG 70 - 120: 0 units CBG 121 - 150: 1 unit,  CBG 151 - 200: 2 units,  CBG 201 - 250: 3 units,  CBG 251 - 300: 5 units,  CBG 301 - 350: 7 units,  CBG 351 - 400: 9 units   CBG > 400: 9 units and notify your MD 09/14/18   Florencia Reasons, MD  insulin NPH Human  (HUMULIN N,NOVOLIN N) 100 UNIT/ML injection Inject 20-30 Units into the skin See admin instructions. Inject 20 units in the morning, and 30 units in the evening 04/04/16   [provider]  levothyroxine (LEVOXYL) 25 MCG tablet Take 1 tablet (25 mcg total) by mouth daily before breakfast. Patient taking differently: Take 50 mcg by mouth daily before breakfast.  06/24/18   Bensimhon, Shaune Pascal, MD  metoprolol (LOPRESSOR) 50 MG tablet Take 1 tablet (50 mg total) by mouth 2 (two) times daily. 03/12/14   Edmisten, Azzie Roup, PA-C  Multiple Vitamins-Iron (MULTIVITAMIN/IRON PO) Take 1 tablet by mouth daily.    [provider]  omeprazole (PRILOSEC) 20 MG capsule Take 20 mg by mouth daily.    [provider]  ondansetron (ZOFRAN) 4 MG tablet Take 4 mg by mouth every 6 (six) hours  as needed for nausea or vomiting.  12/04/17   [provider]  OXYGEN Inhale 4 L into the lungs continuous.     [provider]  Tiotropium Bromide-Olodaterol (STIOLTO RESPIMAT) 2.5-2.5 MCG/ACT AERS Inhale 2 puffs into the lungs daily. 10/26/17   Collene Gobble, MD  torsemide (DEMADEX) 20 MG tablet Take 4 tablets (80 mg total) by mouth 2 (two) times daily. Patient taking differently: Take 20 mg by mouth 2 (two) times daily.  09/14/18   Florencia Reasons, MD  vitamin B-12 (CYANOCOBALAMIN) 500 MCG tablet Take 500 mcg by mouth daily.    [provider]    Family History Family History  Problem Relation Age of Onset  . Breast cancer Sister   . Heart disease Unknown   . Hodgkin's lymphoma Unknown   . Asthma Mother     Social History Social History   Tobacco Use  . Smoking status: Former Smoker    Packs/day: 3.00    Years: 45.00    Pack years: 135.00    Types: Cigarettes    Start date: 11/08/1963    Last attempt to quit: 12/28/2008    Years since quitting: 9.8  . Smokeless tobacco: Never Used  . Tobacco comment: quit smoking 5 years ago  Substance Use Topics  . Alcohol use: Yes     Alcohol/week: 0.0 standard drinks    Comment: 01/14/2014 "used to drink socially in the past; last drink was probably 10 yr ago"  . Drug use: No     Allergies   Ace inhibitors; Statins; and Clindamycin   Review of Systems Review of Systems  All other systems reviewed and are negative.    Physical Exam Updated Vital Signs Temp 98.9 F (37.2 C) (Oral)   Ht 5\' 3"  (1.6 m)   Wt 132.9 kg   SpO2 100%   BMI 51.90 kg/m   Physical Exam Vitals signs and nursing note reviewed.  Constitutional:      General: She is not in acute distress.    Appearance: She is well-developed. She is not diaphoretic.  HENT:     Head: Normocephalic and atraumatic.  Neck:     Musculoskeletal: Normal range of motion and neck supple.  Cardiovascular:     Rate and Rhythm: Normal rate and regular rhythm.     Heart sounds: No murmur. No friction rub. No gallop.   Pulmonary:     Effort: Pulmonary effort is normal. No respiratory distress.     Breath sounds: Rales present. No wheezing.     Comments: Rales in the bases bilaterally. Abdominal:     General: Bowel sounds are normal. There is no distension.     Palpations: Abdomen is soft.     Tenderness: There is no abdominal tenderness.  Musculoskeletal: Normal range of motion.     Comments: Patient has venous stasis of both lower extremities.  They are both edematous.  Skin:    General: Skin is warm and dry.  Neurological:     Mental Status: She is alert and oriented to person, place, and time.      ED Treatments / Results  Labs (all labs ordered are listed, but only abnormal results are displayed) Labs Reviewed - No data to display  EKG EKG Interpretation  Date/Time:  Monday November 11 2018 11:02:49 EST Ventricular Rate:  68 PR Interval:    QRS Duration: 182 QT Interval:  466 QTC Calculation: 496 R Axis:   53 Text Interpretation:  Sinus rhythm Short PR  interval Right bundle branch block Confirmed by Veryl Speak [phone]) on 11/11/2018  11:12:01 AM   Radiology No results found.  Procedures Procedures (including critical care time)  Medications Ordered in ED Medications - No data to display   Initial Impression / Assessment and Plan / ED Course  I have reviewed the triage vital signs and the nursing notes.  Pertinent labs & imaging results that were available during my care of the patient were reviewed by me and considered in my medical decision making (see chart for details).  Patient presents with shortness of breath.  She has a history of CHF and chronic lymphedema of the lower extremities.  She was hypoxic upon presentation despite her normal oxygen she uses at her assisted living facility.  She was given IV Lasix.  Work-up shows findings consistent with a CHF exacerbation.  There is pulmonary edema on the chest x-ray and BNP is elevated.  This was discussed with the hospitalist, Dr. Olevia Bowens who agrees to admit.  Final Clinical Impressions(s) / ED Diagnoses   Final diagnoses:  None    ED Discharge Orders    None       Veryl Speak, MD 11/11/18 [phone]

## 2018-11-11 NOTE — H&P (Signed)
History and Physical    Paige Hardin:[id number] DOB: 1946/10/19 DOA: 11/11/2018  PCP: Paige Poll, MD   Patient coming from: Home.  I have personally briefly reviewed patient's old medical records in [facility]  Chief Complaint: Shortness of breath.  HPI: Paige Hardin is a 72 y.o. female with medical history significant of osteoarthritis, carotid arthrosclerosis, chronic low back pain, COPD, depression, GERD, hyperlipidemia, lung nodules, morbid obesity, paroxysmal atrial fibrillation, history of pneumonia, type 2 diabetes, chronic grade 2 diastolic dysfunction with an EF of 55 to 60% on echo 5 months ago who is coming to the emergency department with complaints of progressively worsening dyspnea for the past 2 days associated with fatigue.  She denies fever, chills, sore throat, productive cough, hemoptysis, but complains wheezing and fatigue.  No chest pain, palpitations, dizziness, diaphoresis, PND, orthopnea, but complains of lower extremity edema.  She denies abdominal pain, nausea or emesis, constipation, diarrhea, melena or hematochezia.  No dysuria, frequency or hematuria.  Denies polyuria, polydipsia, polyphagia or blurred vision.  ED Course: Initial vital signs temperature 98.9 F, pulse 67, respirations 19, blood pressure 140/54 mmHg and O2 sat 100% on NRB.  The patient received supplemental oxygen and 40 mg of furosemide IVP in the emergency department.   Troponin was normal.  BNP was 807.9 pg/mL.  EKG was NSR with RBBB, there is no significant change since last tracing.  Her CBC shows a [place] count was 12.0, hemoglobin 9.0 g/dL and platelets 246.  Sodium is 138, potassium 3.2, chloride 86 and CO2 42 mmol/L.  BUN was 54, creatinine 1.83 and glucose 98 mg/dL.  Total protein 6.4 and albumin 2.8 g/dL.  The rest of the LFTs are within normal limits.  Imaging: Chest radiograph shows cardiomegaly with diffuse interstitial edema.  Please see images and full evaluated report for  further detail.  Review of Systems: As per HPI otherwise 10 point review of systems negative.   Past Medical History:  Diagnosis Date  . Arthritis    "joints" (01/14/2014)  . Carotid atherosclerosis   . Chronic low back pain   . COPD (chronic obstructive pulmonary disease) ([place])   . Depression    "maybe" (01/14/2014  . Diastolic congestive heart failure ([place])   . Emphysema   . Excessive daytime sleepiness 09/08/2015  . GERD (gastroesophageal reflux disease)   . Hoarseness, chronic   . Hyperlipidemia   . Lung nodules   . Obese   . Paroxysmal atrial fibrillation (HCC)    a. failed flecainide;  b. 03/2014 amio started->DCCV;  c. Chronic pradaxa.  . Pneumonia 1990's   "once"  . Type 2 diabetes mellitus ([place])     Past Surgical History:  Procedure Laterality Date  . BREAST BIOPSY Left ~ 1980   "solid"  . BREAST LUMPECTOMY Left ~ 1980   "removed a mass; it was benign" (01/14/2014)  . CARDIOVERSION N/A 03/11/2014   Procedure: CARDIOVERSION;  Surgeon: Sanda Klein, MD;  Location: [facility];  Service: Cardiovascular;  Laterality: N/A;  . CARDIOVERSION N/A 03/27/2014   Procedure: CARDIOVERSION;  Surgeon: Darlin Coco, MD;  Location: [facility];  Service: Cardiovascular;  Laterality: N/A;  . COLONOSCOPY  01/26/2012   Procedure: COLONOSCOPY;  Surgeon: Beryle Beams, MD;  [facility];  Service: Endoscopy;  Laterality: N/A;  . ESOPHAGOGASTRODUODENOSCOPY  01/26/2012   Procedure: ESOPHAGOGASTRODUODENOSCOPY (EGD);  Surgeon: Beryle Beams, MD;  Location: [facility];  Service: Endoscopy;  Laterality: N/A;  . TOTAL ABDOMINAL HYSTERECTOMY  1994  reports that she quit smoking about 9 years ago. Her smoking use included cigarettes. She started smoking about 55 years ago. She has a 135.00 pack-year smoking history. She has never used smokeless tobacco. She reports current alcohol use. She reports that she does not use drugs.  Allergies  Allergen Reactions  . Ace Inhibitors Cough  .  Statins Cough  . Clindamycin Rash    Family History  Problem Relation Age of Onset  . Breast cancer Sister   . Heart disease Unknown   . Hodgkin's lymphoma Unknown   . Asthma Mother    Prior to Admission medications   Medication Sig Start Date End Date Taking? Authorizing Provider  acetaminophen (TYLENOL) 325 MG tablet Take 325 mg by mouth daily as needed for mild pain.    Yes [provider]  albuterol (PROVENTIL HFA;VENTOLIN HFA) 108 (90 Base) MCG/ACT inhaler Inhale 2 puffs into the lungs daily.    Yes [provider]  albuterol (PROVENTIL) (2.5 MG/3ML) 0.083% nebulizer solution Take 2.5 mg by nebulization every 6 (six) hours as needed for wheezing or shortness of breath.   Yes [provider]  allopurinol (ZYLOPRIM) 100 MG tablet Take 1 tablet (100 mg total) by mouth daily. 10/16/17  Yes [place], Holli Humbles, NP  amiodarone (PACERONE) 200 MG tablet Take 1 tablet (200 mg total) by mouth daily. 04/23/14  Yes [place], Scott T, PA-C  ammonium lactate (LAC-HYDRIN) 12 % lotion Apply 1 application topically as needed for dry skin (on both legs).  10/02/18  Yes [provider]  apixaban (ELIQUIS) 5 MG TABS tablet Take 1 tablet (5 mg total) by mouth 2 (two) times daily. 04/03/14  Yes Minor, Grace Bushy, NP  Ascorbic Acid (VITAMIN C) 500 MG CAPS Take 500 mg by mouth daily.    Yes [provider]  calcium-vitamin D (OSCAL WITH D) 500-200 MG-UNIT per tablet Take 1 tablet by mouth daily with breakfast.   Yes [provider]  Cholecalciferol (VITAMIN D) 2000 UNITS tablet Take 2,000 Units by mouth daily.   Yes [provider]  ezetimibe-simvastatin (VYTORIN) 10-40 MG per tablet Take 1 tablet by mouth at bedtime.    Yes [provider]  guaiFENesin (MUCINEX) 600 MG 12 hr tablet Take 1 tablet (600 mg total) by mouth 2 (two) times daily. 09/14/18  Yes Florencia Reasons, MD  insulin aspart (NOVOLOG) 100 UNIT/ML injection Inject 0-9 Units into the skin 3  (three) times daily with meals. Correction factor Sliding scale CBG 70 - 120: 0 units CBG 121 - 150: 1 unit,  CBG 151 - 200: 2 units,  CBG 201 - 250: 3 units,  CBG 251 - 300: 5 units,  CBG 301 - 350: 7 units,  CBG 351 - 400: 9 units   CBG > 400: 9 units and notify your MD 09/14/18  Yes Florencia Reasons, MD  insulin NPH Human (HUMULIN N,NOVOLIN N) 100 UNIT/ML injection Inject 20-30 Units into the skin See admin instructions. Inject 20 units in the morning, and 30 units in the evening 04/04/16  Yes [provider]  levothyroxine (LEVOXYL) 25 MCG tablet Take 1 tablet (25 mcg total) by mouth daily before breakfast. Patient taking differently: Take 50 mcg by mouth daily before breakfast.  06/24/18  Yes Bensimhon, Shaune Pascal, MD  metolazone (ZAROXOLYN) 2.5 MG tablet Take 2.5 mg by mouth every Wednesday.   Yes [provider]  metoprolol (LOPRESSOR) 50 MG tablet Take 1 tablet (50 mg total) by mouth 2 (two) times  daily. 03/12/14  Yes Edmisten, Azzie Roup, PA-C  Multiple Vitamins-Iron (MULTIVITAMINS WITH IRON) TABS tablet Take 1 tablet by mouth daily.   Yes [provider]  omeprazole (PRILOSEC) 20 MG capsule Take 20 mg by mouth daily.   Yes [provider]  ondansetron (ZOFRAN) 4 MG tablet Take 4 mg by mouth every 6 (six) hours as needed for nausea or vomiting.  12/04/17  Yes [provider]  OXYGEN Inhale 4 L into the lungs continuous.    Yes [provider]  torsemide (DEMADEX) 20 MG tablet Take 4 tablets (80 mg total) by mouth 2 (two) times daily. Patient taking differently: Take 20 mg by mouth 2 (two) times daily.  09/14/18  Yes Florencia Reasons, MD  vitamin B-12 (CYANOCOBALAMIN) 500 MCG tablet Take 500 mcg by mouth daily.   Yes [provider]  Tiotropium Bromide-Olodaterol (STIOLTO RESPIMAT) 2.5-2.5 MCG/ACT AERS Inhale 2 puffs into the lungs daily. Patient not taking: Reported on 11/11/2018 10/26/17   Collene Gobble, MD    Physical Exam: Vitals:   11/11/18 1100  11/11/18 1103 11/11/18 1104 11/11/18 1107  BP: (!) 140/54   (!) 140/54  Pulse: 67   67  Resp: 19   18  Temp:   98.9 F (37.2 C) 98.9 F (37.2 C)  TempSrc:   Oral Oral  SpO2: 100%   100%  Weight:  132.9 kg  132.9 kg  Height:  5\' 3"  (1.6 m)  5' 5.5" (1.664 m)    Constitutional: NAD, calm, comfortable Eyes: PERRL, lids and conjunctivae normal ENMT: Mucous membranes are moist. Posterior pharynx clear of any exudate or lesions.  Neck: normal, supple, no JVD, no masses, no thyromegaly Respiratory: Decreased breath sounds in bases with bibasilar rales and bilateral wheezing.  Stage III lymphedema.  Normal respiratory effort. No accessory muscle use.  Cardiovascular: Regular rate and rhythm, no murmurs / rubs / gallops.  2+ lower extremities edema. 2+ pedal pulses. No carotid bruits.  Abdomen: Obese, soft, no tenderness, no masses palpated. No hepatosplenomegaly. Bowel sounds positive.  Musculoskeletal: no clubbing / cyanosis. Good ROM, no contractures. Normal muscle tone.  Skin: Hyperkeratosis and hyperpigmentation of pretibial areas. Neurologic: CN 2-12 grossly intact. Sensation intact, DTR normal. Strength 5/5 in all 4.  Psychiatric: Normal judgment and insight. Alert and oriented x 3. Normal mood.   Labs on Admission: I have personally reviewed following labs and imaging studies  CBC: Recent Labs  Lab 11/05/18 0906 11/11/18 1130  WBC 8.1 12.0*  NEUTROABS 7.1 10.7*  HGB 8.1* 8.0*  HCT 28.6* 28.8*  MCV 98.3 98.0  PLT 208 170   Basic Metabolic Panel: Recent Labs  Lab 11/05/18 0906 11/11/18 1130  NA 137 138  K 3.4* 3.2*  CL 88* 86*  CO2 40* 42*  GLUCOSE 223* 98  BUN 63* 54*  CREATININE 2.11* 1.83*  CALCIUM 8.9 9.0   GFR: Estimated Creatinine Clearance: 38.1 mL/min (A) (by C-G formula based on SCr of 1.83 mg/dL (H)). Liver Function Tests: Recent Labs  Lab 11/05/18 0906 11/11/18 1130  AST 20 19  ALT 15 17  ALKPHOS 65 55  BILITOT 0.7 0.9  PROT 6.4* 6.4*  ALBUMIN  3.4* 2.8*   No results for input(s): LIPASE, AMYLASE in the last 168 hours. No results for input(s): AMMONIA in the last 168 hours. Coagulation Profile: No results for input(s): INR, PROTIME in the last 168 hours. Cardiac Enzymes: No results for input(s): CKTOTAL, CKMB, CKMBINDEX, TROPONINI in the last 168 hours. BNP (  last 3 results) No results for input(s): PROBNP in the last 8760 hours. HbA1C: No results for input(s): HGBA1C in the last 72 hours. CBG: No results for input(s): GLUCAP in the last 168 hours. Lipid Profile: No results for input(s): CHOL, HDL, LDLCALC, TRIG, CHOLHDL, LDLDIRECT in the last 72 hours. Thyroid Function Tests: No results for input(s): TSH, T4TOTAL, FREET4, T3FREE, THYROIDAB in the last 72 hours. Anemia Panel: No results for input(s): VITAMINB12, FOLATE, FERRITIN, TIBC, IRON, RETICCTPCT in the last 72 hours. Urine analysis:    Component Value Date/Time   COLORURINE YELLOW 09/07/2018 [place] 09/07/2018 0717   LABSPEC 1.008 09/07/2018 0717   PHURINE 5.0 09/07/2018 0717   GLUCOSEU NEGATIVE 09/07/2018 0717   HGBUR SMALL (A) 09/07/2018 0717   BILIRUBINUR NEGATIVE 09/07/2018 0717   KETONESUR NEGATIVE 09/07/2018 0717   PROTEINUR NEGATIVE 09/07/2018 0717   UROBILINOGEN 1.0 03/23/2014 1915   NITRITE POSITIVE (A) 09/07/2018 0717   LEUKOCYTESUR LARGE (A) 09/07/2018 0717    Radiological Exams on Admission: Dg Chest Port 1 View  Result Date: 11/11/2018 CLINICAL DATA:  Shortness of breath EXAM: PORTABLE CHEST 1 VIEW COMPARISON:  09/13/2018, 09/06/2018 FINDINGS: Marked enlargement of cardiac silhouette. Diffuse interstitial opacities throughout both lungs concerning for superimposed edema, similar to the prior study. Mild apical emphysema, better appreciated on comparison CT. No large effusion or pneumothorax. Aorta atherosclerotic. Trachea is midline. Previously described right lower lobe pulmonary nodule by chest CT 12/18/2017 is not well  demonstrated by plain radiography. IMPRESSION: Cardiomegaly with diffuse interstitial edema pattern concerning for CHF. Electronically Signed   By: Jerilynn Mages.  Shick M.D.   On: 11/11/2018 11:37    EKG: Independently reviewed.  Vent. rate 68 BPM PR interval * ms QRS duration 182 ms QT/QTc 466/496 ms P-R-T axes 25 53 66 Normal sinus rhythm Right bundle branch block Abnormal ECG  Assessment/Plan Principal Problem:   Acute on chronic diastolic congestive heart failure (HCC) Observation/telemetry. Continue supplemental oxygen. Furosemide 40 mg IVP twice a day. Transfuse 1 unit of PRBC as planned. Monitor daily weights, intake and output. Monitor renal function and electrolytes.  Active Problems:   Iron deficiency anemia Patient was scheduled for blood transfusion tomorrow. Will transfuse 1 unit of PRBC while in the hospital..    COPD with emphysema Continue supplemental oxygen. Continue scheduled and as needed bronchodilators.    PAF (paroxysmal atrial fibrillation)  s/p multiple DCCVs CHA?DS?-VASc Score of at least 4. Continue amiodarone 200 mg p.o. daily. Continue metoprolol 50 mg p.o. twice daily. Continue apixaban 5 mg p.o. twice daily.    HTN (hypertension) On metoprolol and furosemide/metolazone. Monitor BP, HR, renal function and electrolytes.    Hyperlipidemia Continue Vytorin 10-40 mg p.o. daily. Monitor LFTs as needed.    CKD (chronic kidney disease), stage IV (HCC) Monitor renal function and electrolytes.    Type 2 diabetes mellitus (HCC) Car modified diet. Continue NPH 20 units SQ in the morning and 30 units in p.m. CBG monitoring with regular insulin sliding scale.    Hypothyroidism Continue levothyroxine 50 mcg p.o. daily. Monitor TSH as outpatient or needed.    DVT prophylaxis: On apixaban. Code Status: Full code. Family Communication: Disposition Plan: Observation for diastolic CHF to observation and PRBC transfusion. Consults called:  Admission  status: Observation/telemetry.   Reubin Milan MD [facility]  If 7PM-7AM, please contact night-coverage [email or web address] Password [email or web address]  11/11/2018, 2:11 PM

## 2018-11-11 NOTE — Progress Notes (Signed)
RN accidentally released outpatient orders for Blood transfusion. Transfusion order for this admission is to give 1 unit of blood. Blood transfusing now.

## 2018-11-11 NOTE — ED Triage Notes (Signed)
Pt arrives to ED from [facility] assisted living with complaints of SOB for the last two days. EMS reports pt is due for a blood transfusion tomorrow for a low hemoglobin. Pt placed on NRB mask via EMS for O2 at 88% on room air. Pt placed in position of comfort with bed locked and lowered, call bell in reach.

## 2018-11-12 ENCOUNTER — Other Ambulatory Visit: Payer: Medicare Other

## 2018-11-12 ENCOUNTER — Observation Stay (HOSPITAL_COMMUNITY): Payer: Medicare Other

## 2018-11-12 ENCOUNTER — Other Ambulatory Visit: Payer: Self-pay

## 2018-11-12 DIAGNOSIS — D509 Iron deficiency anemia, unspecified: Secondary | ICD-10-CM | POA: Diagnosis not present

## 2018-11-12 DIAGNOSIS — I509 Heart failure, unspecified: Secondary | ICD-10-CM | POA: Diagnosis not present

## 2018-11-12 DIAGNOSIS — Z6841 Body Mass Index (BMI) 40.0 and over, adult: Secondary | ICD-10-CM | POA: Diagnosis not present

## 2018-11-12 DIAGNOSIS — K219 Gastro-esophageal reflux disease without esophagitis: Secondary | ICD-10-CM | POA: Diagnosis present

## 2018-11-12 DIAGNOSIS — I480 Paroxysmal atrial fibrillation: Secondary | ICD-10-CM | POA: Diagnosis not present

## 2018-11-12 DIAGNOSIS — Z794 Long term (current) use of insulin: Secondary | ICD-10-CM | POA: Diagnosis not present

## 2018-11-12 DIAGNOSIS — M2550 Pain in unspecified joint: Secondary | ICD-10-CM | POA: Diagnosis not present

## 2018-11-12 DIAGNOSIS — R5381 Other malaise: Secondary | ICD-10-CM | POA: Diagnosis not present

## 2018-11-12 DIAGNOSIS — N184 Chronic kidney disease, stage 4 (severe): Secondary | ICD-10-CM | POA: Diagnosis not present

## 2018-11-12 DIAGNOSIS — Z66 Do not resuscitate: Secondary | ICD-10-CM | POA: Diagnosis present

## 2018-11-12 DIAGNOSIS — R0603 Acute respiratory distress: Secondary | ICD-10-CM | POA: Diagnosis not present

## 2018-11-12 DIAGNOSIS — G8929 Other chronic pain: Secondary | ICD-10-CM | POA: Diagnosis present

## 2018-11-12 DIAGNOSIS — J439 Emphysema, unspecified: Secondary | ICD-10-CM | POA: Diagnosis not present

## 2018-11-12 DIAGNOSIS — R0902 Hypoxemia: Secondary | ICD-10-CM | POA: Diagnosis not present

## 2018-11-12 DIAGNOSIS — I5033 Acute on chronic diastolic (congestive) heart failure: Secondary | ICD-10-CM | POA: Diagnosis not present

## 2018-11-12 DIAGNOSIS — I878 Other specified disorders of veins: Secondary | ICD-10-CM | POA: Diagnosis present

## 2018-11-12 DIAGNOSIS — E039 Hypothyroidism, unspecified: Secondary | ICD-10-CM | POA: Diagnosis present

## 2018-11-12 DIAGNOSIS — E785 Hyperlipidemia, unspecified: Secondary | ICD-10-CM | POA: Diagnosis not present

## 2018-11-12 DIAGNOSIS — E1122 Type 2 diabetes mellitus with diabetic chronic kidney disease: Secondary | ICD-10-CM | POA: Diagnosis not present

## 2018-11-12 DIAGNOSIS — Z79899 Other long term (current) drug therapy: Secondary | ICD-10-CM | POA: Diagnosis not present

## 2018-11-12 DIAGNOSIS — I4510 Unspecified right bundle-branch block: Secondary | ICD-10-CM | POA: Diagnosis present

## 2018-11-12 DIAGNOSIS — Z8701 Personal history of pneumonia (recurrent): Secondary | ICD-10-CM | POA: Diagnosis not present

## 2018-11-12 DIAGNOSIS — Z7901 Long term (current) use of anticoagulants: Secondary | ICD-10-CM | POA: Diagnosis not present

## 2018-11-12 DIAGNOSIS — F329 Major depressive disorder, single episode, unspecified: Secondary | ICD-10-CM | POA: Diagnosis present

## 2018-11-12 DIAGNOSIS — I130 Hypertensive heart and chronic kidney disease with heart failure and stage 1 through stage 4 chronic kidney disease, or unspecified chronic kidney disease: Secondary | ICD-10-CM | POA: Diagnosis not present

## 2018-11-12 DIAGNOSIS — N179 Acute kidney failure, unspecified: Secondary | ICD-10-CM | POA: Diagnosis not present

## 2018-11-12 DIAGNOSIS — Z7401 Bed confinement status: Secondary | ICD-10-CM | POA: Diagnosis not present

## 2018-11-12 LAB — BASIC METABOLIC PANEL
Anion gap: 12 (ref 5–15)
BUN: 54 mg/dL — ABNORMAL HIGH (ref 8–23)
CO2: 38 mmol/L — ABNORMAL HIGH (ref 22–32)
CREATININE: 1.78 mg/dL — AB (ref 0.44–1.00)
Calcium: 8.7 mg/dL — ABNORMAL LOW (ref 8.9–10.3)
Chloride: 86 mmol/L — ABNORMAL LOW (ref 98–111)
GFR calc Af Amer: 32 mL/min — ABNORMAL LOW (ref >60)
GFR calc non Af Amer: 28 mL/min — ABNORMAL LOW (ref >60)
Glucose, Bld: 89 mg/dL (ref 70–99)
Potassium: 3.5 mmol/L (ref 3.5–5.1)
Sodium: 136 mmol/L (ref 135–145)

## 2018-11-12 LAB — GLUCOSE, CAPILLARY
GLUCOSE-CAPILLARY: 159 mg/dL — AB (ref 70–99)
Glucose-Capillary: 117 mg/dL — ABNORMAL HIGH (ref 70–99)
Glucose-Capillary: 139 mg/dL — ABNORMAL HIGH (ref 70–99)
Glucose-Capillary: 221 mg/dL — ABNORMAL HIGH (ref 70–99)

## 2018-11-12 LAB — TYPE AND SCREEN
ABO/RH(D): A POS
Antibody Screen: NEGATIVE
Unit division: 0

## 2018-11-12 LAB — CBC
HCT: 30 % — ABNORMAL LOW (ref 36.0–46.0)
Hemoglobin: 8.9 g/dL — ABNORMAL LOW (ref 12.0–15.0)
MCH: 28.3 pg (ref 26.0–34.0)
MCHC: 29.7 g/dL — ABNORMAL LOW (ref 30.0–36.0)
MCV: 95.2 fL (ref 80.0–100.0)
Platelets: 219 10*3/uL (ref 150–400)
RBC: 3.15 MIL/uL — ABNORMAL LOW (ref 3.87–5.11)
RDW: 18.4 % — AB (ref 11.5–15.5)
WBC: 12.1 10*3/uL — ABNORMAL HIGH (ref 4.0–10.5)
nRBC: 0 % (ref 0.0–0.2)

## 2018-11-12 LAB — PROCALCITONIN: Procalcitonin: 0.33 ng/mL

## 2018-11-12 LAB — BPAM RBC
Blood Product Expiration Date: 201912232359
ISSUE DATE / TIME: 201912161803
Unit Type and Rh: 600

## 2018-11-12 MED ORDER — IPRATROPIUM-ALBUTEROL 0.5-2.5 (3) MG/3ML IN SOLN: 3.0000 mL | RESPIRATORY_TRACT | Status: DC | PRN

## 2018-11-12 MED ORDER — FUROSEMIDE 10 MG/ML IJ SOLN
40.0000 mg | Freq: Once | INTRAMUSCULAR | Status: AC
  Administered 2018-11-12: 40 mg via INTRAVENOUS
  Filled 2018-11-12: qty 4

## 2018-11-12 MED ORDER — METHYLPREDNISOLONE SODIUM SUCC 125 MG IJ SOLR
80.0000 mg | Freq: Once | INTRAMUSCULAR | Status: AC
  Administered 2018-11-12: 80 mg via INTRAVENOUS
  Filled 2018-11-12: qty 2

## 2018-11-12 NOTE — Progress Notes (Signed)
Patient complaining of increased SOB. Purse lip breathing. Sat 80"s on baseline 4L of O2. Patient with 800cc output after receiving a total   120mg  Lasix. Bladder scanned patient  Bladder scan showed  > 500cc.Bodenheimer NP paged and notified. Rapid Response RN asked to assess patent while on unit.

## 2018-11-12 NOTE — Progress Notes (Signed)
Chest X-Ray ordered by RR RN. Bodenheimer Np gave order for additional 40mg  lasix, iv solumedrol and prn albuterol breathing treatments. Order initiated. Patient current O2 Sat 93% on 4L. No other complaints at this time. Will continue to monitor patient.

## 2018-11-12 NOTE — Consult Note (Signed)
   [facility] CM Inpatient Consult   11/12/2018  Paige Hardin [birth year] [id number]  Chart reviewed for high risk score for unplanned readmissions.  Patient's primary care provider is a non [facility] provider and the patient. Patient also lives at [facility] facility.  Patient is not eligible for [facility] Care Management services.  Natividad Brood, RN BSN [facility] Liaison  [phone] business mobile phone Toll free office [phone]

## 2018-11-12 NOTE — Clinical Social Work Note (Signed)
Clinical Social Work Assessment  Patient Details  Name: Paige Hardin MRN: [id number] Date of Birth: Mar 04, 1946  Date of referral:  11/12/18               Reason for consult:  Discharge Planning                Permission sought to share information with:  Chartered certified accountant granted to share information::  Yes, Verbal Permission Granted  Name::        Agency::  [facility]  Relationship::     Contact Information:     Housing/Transportation Living arrangements for the past 2 months:  [place] of Information:  Patient, Medical Team, Facility Patient Interpreter Needed:  None Criminal Activity/Legal Involvement Pertinent to Current Situation/Hospitalization:  No - Comment as needed Significant Relationships:  Other Family Members Lives with:  Facility Resident Do you feel safe going back to the place where you live?  Yes Need for family participation in patient care:  Yes (Comment)  Care giving concerns:  Patient is a resident at [facility]F.   Social Worker assessment / plan:  CSW met with patient. No supports at bedside. CSW introduced role and explained that discharge planning would be discussed. Patient lethargic but able to answer CSW's questions. She confirmed she is a resident at [facility] and plans to return at discharge. Patient discharged to [facility] SN[facility] for rehab on 10/19. No further concerns. CSW encouraged patient to contact CSW as needed. CSW will continue to follow patient for support and facilitate discharge back to ALF once medically stable.  Employment status:  Retired Forensic scientist:  Medicare PT Recommendations:  Not assessed at this time [place] / Referral to community resources:  Other (Comment Required)(Plan is to return to ALF.)  Patient/Family's Response to care:  Patient agreeable to return to ALF. Patient's family supportive and involved in patient's care. Patient  appreciated social work intervention.  Patient/Family's Understanding of and Emotional Response to Diagnosis, Current Treatment, and Prognosis:  Patient has a good understanding of the reason for admission and plan to return to ALF at discharge. Patient appears pleased with hospital care.  Emotional Assessment Appearance:  Appears stated age Attitude/Demeanor/Rapport:  Gracious, Lethargic Affect (typically observed):  Accepting, Appropriate, Calm Orientation:  Oriented to Self, Oriented to Place, Oriented to  Time, Oriented to Situation Alcohol / Substance use:  Never Used Psych involvement (Current and /or in the community):  No (Comment)  Discharge Needs  Concerns to be addressed:  Care Coordination Readmission within the last 30 days:  No Current discharge risk:  None Barriers to Discharge:  Continued Medical Work up   Candie Chroman, LCSW 11/12/2018, 12:12 PM

## 2018-11-12 NOTE — Plan of Care (Signed)
  Problem: Health Behavior/Discharge Planning: Goal: Ability to manage health-related needs will improve Outcome: Progressing   Problem: Clinical Measurements: Goal: Ability to maintain clinical measurements within normal limits will improve Outcome: Progressing   Problem: Nutrition: Goal: Adequate nutrition will be maintained Outcome: Progressing   Problem: Safety: Goal: Ability to remain free from injury will improve Outcome: Progressing   Problem: Skin Integrity: Goal: Risk for impaired skin integrity will decrease Outcome: Progressing   Problem: Activity: Goal: Capacity to carry out activities will improve Outcome: Progressing

## 2018-11-12 NOTE — Progress Notes (Signed)
PROGRESS NOTE    Paige Hardin  AYT:[id number] DOB: Mar 29, 1946 DOA: 11/11/2018 PCP: Reymundo Poll, MD   Brief Narrative:   72 year old with history of COPD on 4 L nasal cannula, GERD, grade 2 diastolic CHF, diabetes mellitus type 2, paroxysmal atrial fibrillation, hyperlipidemia, morbid obesity with BMI greater than 45 came to the hospital with complaints of fatigue and progressive shortness of breath.  Upon admission she was noted to be fluid overloaded, acute kidney injury and anemic.  Assessment & Plan:   Principal Problem:   Acute on chronic diastolic congestive heart failure (HCC) Active Problems:   COPD with emphysema    PAF (paroxysmal atrial fibrillation) s/p multiple DCCVs; on Amiodarone   HTN (hypertension)   Iron deficiency anemia   Hyperlipidemia   CKD (chronic kidney disease), stage IV (HCC)   Type 2 diabetes mellitus (HCC)   Acute exacerbation of CHF (congestive heart failure) (HCC)  Acute respiratory distress with hypoxia, slowly improving Acute on chronic hypoxia, 4 L nasal cannula at home Acute on chronic diastolic congestive heart failure, grade 3 - Continue supplemental oxygenation at this time - Lasix 40 mg IV twice daily -Closely monitor electrolytes.  Monitor input and output -Wean off oxygen as needed -Bronchodilators as needed Procalcitonin level 0.33  Iron deficiency anemia -Received 1 unit of PRBC as initially thought this could be worsening her shortness of breath.  Currently hemoglobin stable at 8.9.  Chronic kidney disease stage III - Creatinine is at baseline around 1.7.  Paroxysmal atrial fibrillation -Continue amiodarone 200 mg daily, metoprolol 50 mg twice daily, Eliquis 5 mg twice daily  COPD with emphysema - Continue bronchodilators as needed.  Supplemental oxygen at 4 L nasal cannula  Essential hypertension -Resume home meds  Hyperlipidemia -Continue statin  Diabetes mellitus type 2 -Resume home insulin.  Insulin sliding scale and  Accu-Chek  Hypothyroidism -Continue Synthroid  DVT prophylaxis: On Eliquis Code Status: Full code Family Communication: None at bedside Disposition Plan: Patient still requires another 24 hours of hospitalization stay for IV diuresis.  She is still quite dyspneic with minimal movement  Consultants:   None  Procedures:   None  Antimicrobials:   None   Subjective: Patient admits of feeling slightly better but still quite dyspneic with minimal movement and exertion.  Does not feel like she is back to herself yet.  Review of Systems Otherwise negative except as per HPI, including: General: Denies fever, chills, night sweats or unintended weight loss. Resp: Denies cough, wheezing, Cardiac: Denies chest pain, palpitations, orthopnea, paroxysmal nocturnal dyspnea. GI: Denies abdominal pain, nausea, vomiting, diarrhea or constipation GU: Denies dysuria, frequency, hesitancy or incontinence MS: Denies muscle aches, joint pain or swelling Neuro: Denies headache, neurologic deficits (focal weakness, numbness, tingling), abnormal gait Psych: Denies anxiety, depression, SI/HI/AVH Skin: Denies new rashes or lesions ID: Denies sick contacts, exotic exposures, travel  Objective: Vitals:   11/12/18 0200 11/12/18 0354 11/12/18 0811 11/12/18 1151  BP:  (!) 139/54 (!) 140/53 (!) 140/59  Pulse:  72 70 66  Resp:  18 19 19   Temp:  99.1 F (37.3 C) 97.8 F (36.6 C) 97.7 F (36.5 C)  TempSrc:  Oral Oral Oral  SpO2: 93% 94% 92% 93%  Weight:  133.8 kg    Height:        Intake/Output Summary (Last 24 hours) at 11/12/2018 1224 Last data filed at 11/12/2018 0401 Gross per 24 hour  Intake 1471 ml  Output 1880 ml  Net -409 ml   [employer]  11/11/18 1103 11/11/18 1107 11/12/18 0354  Weight: 132.9 kg 132.9 kg 133.8 kg    Examination:  General exam: Appears calm and comfortable, morbidly obese on 4 L nasal cannula Respiratory system: Bilateral diminished breath sounds with  bibasilar crackles Cardiovascular system: S1 & S2 heard, RRR. No JVD, murmurs, rubs, gallops or clicks.  3+ lower extremity pitting edema Gastrointestinal system: Abdomen is nondistended, soft and nontender. No organomegaly or masses felt. Normal bowel sounds heard. Central nervous system: Alert and oriented. No focal neurological deficits. Extremities: Symmetric 5 x 5 power. Skin: No rashes, lesions or ulcers Psychiatry: Judgement and insight appear normal. Mood & affect appropriate.    Data Reviewed:   CBC: Recent Labs  Lab 11/11/18 1130 11/12/18 0501  WBC 12.0* 12.1*  NEUTROABS 10.7*  --   HGB 8.0* 8.9*  HCT 28.8* 30.0*  MCV 98.0 95.2  PLT 246 505   Basic Metabolic Panel: Recent Labs  Lab 11/11/18 1130 11/12/18 0501  NA 138 136  K 3.2* 3.5  CL 86* 86*  CO2 42* 38*  GLUCOSE 98 89  BUN 54* 54*  CREATININE 1.83* 1.78*  CALCIUM 9.0 8.7*  MG 2.0  --    GFR: Estimated Creatinine Clearance: 39.3 mL/min (A) (by C-G formula based on SCr of 1.78 mg/dL (H)). Liver Function Tests: Recent Labs  Lab 11/11/18 1130  AST 19  ALT 17  ALKPHOS 55  BILITOT 0.9  PROT 6.4*  ALBUMIN 2.8*   No results for input(s): LIPASE, AMYLASE in the last 168 hours. No results for input(s): AMMONIA in the last 168 hours. Coagulation Profile: No results for input(s): INR, PROTIME in the last 168 hours. Cardiac Enzymes: No results for input(s): CKTOTAL, CKMB, CKMBINDEX, TROPONINI in the last 168 hours. BNP (last 3 results) No results for input(s): PROBNP in the last 8760 hours. HbA1C: No results for input(s): HGBA1C in the last 72 hours. CBG: Recent Labs  Lab 11/11/18 1621 11/11/18 2139 11/12/18 0744 11/12/18 1131  GLUCAP 134* 124* 117* 159*   Lipid Profile: No results for input(s): CHOL, HDL, LDLCALC, TRIG, CHOLHDL, LDLDIRECT in the last 72 hours. Thyroid Function Tests: No results for input(s): TSH, T4TOTAL, FREET4, T3FREE, THYROIDAB in the last 72 hours. Anemia Panel: No  results for input(s): VITAMINB12, FOLATE, FERRITIN, TIBC, IRON, RETICCTPCT in the last 72 hours. Sepsis Labs: Recent Labs  Lab 11/12/18 0501  PROCALCITON 0.33    Recent Results (from the past 240 hour(s))  MRSA PCR Screening     Status: None   Collection Time: 11/11/18  4:00 PM  Result Value Ref Range Status   MRSA by PCR NEGATIVE NEGATIVE Final    Comment:        The GeneXpert MRSA Assay (FDA approved for NASAL specimens only), is one component of a comprehensive MRSA colonization surveillance program. It is not intended to diagnose MRSA infection nor to guide or monitor treatment for MRSA infections. Performed at [facility], [place] [address], [place], [place] [id number]          Radiology Studies: Dg Chest Port 1 View  Result Date: 11/12/2018 CLINICAL DATA:  Acute respiratory distress increased shortness of breath. EXAM: PORTABLE CHEST 1 VIEW COMPARISON:  Radiograph yesterday FINDINGS: Cardiomegaly is unchanged. Aortic atherosclerosis. Pulmonary edema without significant change. No large pleural effusion. No new airspace disease. No pneumothorax. IMPRESSION: Unchanged CHF since radiographs yesterday with cardiomegaly and pulmonary edema. Aortic Atherosclerosis (ICD10-I70.0). Electronically Signed   By: Keith Rake M.D.   On: 11/12/2018  02:05   Dg Chest Port 1 View  Result Date: 11/11/2018 CLINICAL DATA:  Shortness of breath EXAM: PORTABLE CHEST 1 VIEW COMPARISON:  09/13/2018, 09/06/2018 FINDINGS: Marked enlargement of cardiac silhouette. Diffuse interstitial opacities throughout both lungs concerning for superimposed edema, similar to the prior study. Mild apical emphysema, better appreciated on comparison CT. No large effusion or pneumothorax. Aorta atherosclerotic. Trachea is midline. Previously described right lower lobe pulmonary nodule by chest CT 12/18/2017 is not well demonstrated by plain radiography. IMPRESSION: Cardiomegaly with diffuse interstitial edema  pattern concerning for CHF. Electronically Signed   By: Jerilynn Mages.  Shick M.D.   On: 11/11/2018 11:37        Scheduled Meds: . sodium chloride  250 mL Intravenous Once  . allopurinol  100 mg Oral Daily  . amiodarone  200 mg Oral Daily  . apixaban  5 mg Oral BID  . ezetimibe-simvastatin  1 tablet Oral QHS  . furosemide  40 mg Intravenous BID  . guaiFENesin  600 mg Oral BID  . insulin aspart  0-20 Units Subcutaneous TID WC  . insulin NPH Human  20 Units Subcutaneous QAC breakfast  . insulin NPH Human  30 Units Subcutaneous QHS  . ipratropium-albuterol  3 mL Nebulization Once  . levothyroxine  50 mcg Oral QAC breakfast  . [START ON 11/13/2018] metolazone  2.5 mg Oral Q Wed  . metoprolol tartrate  50 mg Oral BID  . pantoprazole  40 mg Oral Daily  . potassium chloride  40 mEq Oral Daily   Continuous Infusions:   LOS: 0 days   Time spent= 35 mins    Ankit Arsenio Loader, MD [facility] Pager [phone]   If 7PM-7AM, please contact night-coverage [email or web address] Password [facility] 11/12/2018, 12:24 PM

## 2018-11-12 NOTE — Significant Event (Signed)
Rapid Response Event Note  Overview:  Asked by RN to come see pt while already on unit. RN states pt with increased WOB and O2 sats dropping to high 80s. Pt has received a total of 120mg  Lasix with around 882ml out. Per RN, pt with minimal output since arrival from ED and bladder scaN SHOWING >500ML.    Initial Focused Assessment: On arrival, pt sitting in bed with pursed lip breathing, spO2 87% on 4L [place] (home dose). Lung sounds clear/diminished, RR 28, HR 79, BP 131/47. Pt with +2-3 BLE edema.   Interventions: NP paged prior to my arrival I&O cath-348ml (pt also incontinent in bed) O2 increased to 6L [place]- spO2 increased to 92% CXR NP-Bodenheimer paged and informed of orders placed and pt status.  New orders for additional Lasix, Solumedrol, Neb treatments PRN. spO2 currently 94% on 6L  Plan of Care (if not transferred): Continue to monitor pt breathing. RN instructed to call with any changes or concerns. Will check back on pt status throughout shift.  Event Summary:  Notified at [place]     event ended at  [place]

## 2018-11-13 LAB — BASIC METABOLIC PANEL
Anion gap: 14 (ref 5–15)
BUN: 58 mg/dL — AB (ref 8–23)
CO2: 37 mmol/L — ABNORMAL HIGH (ref 22–32)
CREATININE: 1.62 mg/dL — AB (ref 0.44–1.00)
Calcium: 8.7 mg/dL — ABNORMAL LOW (ref 8.9–10.3)
Chloride: 86 mmol/L — ABNORMAL LOW (ref 98–111)
GFR calc Af Amer: 36 mL/min — ABNORMAL LOW (ref >60)
GFR calc non Af Amer: 31 mL/min — ABNORMAL LOW (ref >60)
Glucose, Bld: 138 mg/dL — ABNORMAL HIGH (ref 70–99)
Potassium: 3.7 mmol/L (ref 3.5–5.1)
Sodium: 137 mmol/L (ref 135–145)

## 2018-11-13 LAB — GLUCOSE, CAPILLARY
Glucose-Capillary: 126 mg/dL — ABNORMAL HIGH (ref 70–99)
Glucose-Capillary: 127 mg/dL — ABNORMAL HIGH (ref 70–99)

## 2018-11-13 LAB — CBC
HCT: 30 % — ABNORMAL LOW (ref 36.0–46.0)
Hemoglobin: 8.7 g/dL — ABNORMAL LOW (ref 12.0–15.0)
MCH: 27.9 pg (ref 26.0–34.0)
MCHC: 29 g/dL — AB (ref 30.0–36.0)
MCV: 96.2 fL (ref 80.0–100.0)
Platelets: 196 10*3/uL (ref 150–400)
RBC: 3.12 MIL/uL — ABNORMAL LOW (ref 3.87–5.11)
RDW: 18.2 % — ABNORMAL HIGH (ref 11.5–15.5)
WBC: 9.7 10*3/uL (ref 4.0–10.5)
nRBC: 0 % (ref 0.0–0.2)

## 2018-11-13 LAB — MAGNESIUM: Magnesium: 2.2 mg/dL (ref 1.7–2.4)

## 2018-11-13 NOTE — Clinical Social Work Note (Addendum)
CSW notified Juliann Pulse, Therapist, sports at [facility] that patient will be returning today.   Dayton Scrape, [place]  12:05 pm Faxed discharge summary and FL2 to ALF. Left voicemail for RN asking her to review and call to notify CSW if any changes need to be made or if patient can go ahead and return.  Dayton Scrape, [place]

## 2018-11-13 NOTE — Clinical Social Work Note (Signed)
CSW facilitated patient discharge including contacting patient family (Family at bedside) and facility to confirm patient discharge plans. Clinical information faxed to facility and family agreeable with plan. CSW arranged ambulance transport via [place] to [place]. RN to call report prior to discharge [phone]).  CSW will sign off for now as social work intervention is no longer needed. Please consult [place] again if new needs arise.  Dayton Scrape, [place]

## 2018-11-13 NOTE — NC FL2 (Signed)
[place] LEVEL OF CARE SCREENING TOOL     IDENTIFICATION  Patient Name: Paige Hardin Birthdate: [birth year] Sex: female Admission Date (Current Location): 11/11/2018  [facility] and [place] Number:  Herbalist and Address:  [facility], [place] [address], [place], [place] [address]      Provider Number: [id number]  Attending Physician Name and Address:  Damita Lack, MD  Relative Name and Phone Number:       Current Level of Care: Hospital Recommended Level of Care: [place] Prior Approval Number:    Date Approved/Denied:   PASRR Number:    Discharge Plan: Other (Comment)(ALF)    Current Diagnoses: Patient Active Problem List   Diagnosis Date Noted  . Acute exacerbation of CHF (congestive heart failure) ([place]) 11/12/2018  . Acute on chronic diastolic congestive heart failure ([place]) 11/11/2018  . Type 2 diabetes mellitus ([place]) 11/11/2018  . Pressure injury of skin 09/14/2018  . CHF exacerbation ([place]) 09/07/2018  . CKD (chronic kidney disease), stage IV ([place]) 09/07/2018  . GI bleeding 05/21/2018  . Anemia of chronic renal failure, stage 3 (moderate) ([place]) 03/27/2017  . CHF (congestive heart failure) ([place]) 10/17/2016  . UTI (urinary tract infection) 10/17/2016  . Solitary pulmonary nodule 12/23/2015  . Type 2 diabetes mellitus with hyperglycemia ([place]) 11/16/2015  . Hyperlipidemia 11/16/2015  . Excessive daytime sleepiness 09/08/2015  . Iron deficiency anemia 03/29/2014  . Prolonged QT interval 03/01/2014  . Altered mental status 02/24/2014  . HTN (hypertension) 01/21/2014  . Chronic anticoagulation 01/21/2014  . Pulmonary HTN- pa 53 mmHg Echo 01/15/14 01/21/2014  . Morbid obesity- BMI 50 01/21/2014  . Poorly controlled diabetes mellitus ([place]) 01/21/2014  . Chronic renal disease, stage III ([place]) 01/21/2014  . Chronic diastolic heart failure ([place]) 01/14/2014  . CHEST PAIN 09/26/2010  .  HYPERLIPIDEMIA-MIXED 05/14/2009  . HOARSENESS 05/14/2009  . CAROTID BRUIT 05/14/2009  . COPD with emphysema  05/05/2009  . PAF (paroxysmal atrial fibrillation) s/p multiple DCCVs; on Amiodarone 05/05/2009    Orientation RESPIRATION BLADDER Height & Weight     Self, Time, Situation, Place  O2(Nasal Canula 4 L.) Continent Weight: 289 lb 7.4 oz (131.3 kg) Height:  5' 5.5" (166.4 cm)  BEHAVIORAL SYMPTOMS/MOOD NEUROLOGICAL BOWEL NUTRITION STATUS  (None) (None) Continent Diet(No salt added, CCHO.)  AMBULATORY STATUS COMMUNICATION OF NEEDS Skin     Verbally Bruising, Other (Comment)(Deep tissue injury on right sacrum.)                       Personal Care Assistance Level of Assistance              Functional Limitations Info  Sight, Hearing, Speech Sight Info: Adequate Hearing Info: Adequate Speech Info: Adequate    SPECIAL CARE FACTORS FREQUENCY                       Contractures Contractures Info: Not present    Additional Factors Info  Code Status, Allergies Code Status Info: DNR Allergies Info: Ace Inhibitors, Statins, Clindamycin.           Current Medications (11/13/2018):  This is the current hospital active medication list Current Facility-Administered Medications  Medication Dose Route Frequency Provider Last Rate Last Dose  . 0.9 %  sodium chloride infusion (Manually program via Guardrails IV Fluids)  250 mL Intravenous Once Volanda Napoleon, MD      . acetaminophen (TYLENOL) tablet 650  mg  650 mg Oral Q6H PRN Reubin Milan, MD       Or  . acetaminophen (TYLENOL) suppository 650 mg  650 mg Rectal Q6H PRN Reubin Milan, MD      . albuterol (PROVENTIL) (2.5 MG/3ML) 0.083% nebulizer solution 2.5 mg  2.5 mg Nebulization Q6H PRN Reubin Milan, MD      . allopurinol (ZYLOPRIM) tablet 100 mg  100 mg Oral Daily Reubin Milan, MD   100 mg at 11/13/18 1443  . amiodarone (PACERONE) tablet 200 mg  200 mg Oral Daily Reubin Milan,  MD   200 mg at 11/13/18 1540  . apixaban (ELIQUIS) tablet 5 mg  5 mg Oral BID Reubin Milan, MD   5 mg at 11/13/18 0867  . ezetimibe-simvastatin (VYTORIN) 10-40 MG per tablet 1 tablet  1 tablet Oral QHS Reubin Milan, MD   1 tablet at 11/12/18 2221  . furosemide (LASIX) injection 40 mg  40 mg Intravenous BID Reubin Milan, MD   40 mg at 11/13/18 6195  . guaiFENesin (MUCINEX) 12 hr tablet 600 mg  600 mg Oral BID Reubin Milan, MD   600 mg at 11/13/18 0935  . insulin aspart (novoLOG) injection 0-20 Units  0-20 Units Subcutaneous TID WC Reubin Milan, MD   3 Units at 11/13/18 0800  . insulin NPH Human (HUMULIN N,NOVOLIN N) injection 20 Units  20 Units Subcutaneous QAC breakfast Reubin Milan, MD   20 Units at 11/13/18 0800  . insulin NPH Human (HUMULIN N,NOVOLIN N) injection 30 Units  30 Units Subcutaneous QHS Reubin Milan, MD   30 Units at 11/12/18 2220  . ipratropium-albuterol (DUONEB) 0.5-2.5 (3) MG/3ML nebulizer solution 3 mL  3 mL Nebulization Once Reubin Milan, MD      . ipratropium-albuterol (DUONEB) 0.5-2.5 (3) MG/3ML nebulizer solution 3 mL  3 mL Nebulization Q4H PRN Bodenheimer, Charles A, NP      . levothyroxine (SYNTHROID, LEVOTHROID) tablet 50 mcg  50 mcg Oral QAC breakfast Reubin Milan, MD   50 mcg at 11/13/18 0518  . metolazone (ZAROXOLYN) tablet 2.5 mg  2.5 mg Oral Q Wed Reubin Milan, MD   2.5 mg at 11/13/18 0932  . metoprolol tartrate (LOPRESSOR) tablet 50 mg  50 mg Oral BID Reubin Milan, MD   50 mg at 11/13/18 6712  . pantoprazole (PROTONIX) EC tablet 40 mg  40 mg Oral Daily Reubin Milan, MD   40 mg at 11/13/18 4580  . potassium chloride SA (K-DUR,KLOR-CON) CR tablet 40 mEq  40 mEq Oral Daily Reubin Milan, MD   40 mEq at 11/13/18 9983     Discharge Medications: STOP taking these medications   Tiotropium Bromide-Olodaterol 2.5-2.5 MCG/ACT Aers Commonly known as:  STIOLTO RESPIMAT     TAKE  these medications   acetaminophen 325 MG tablet Commonly known as:  TYLENOL Take 325 mg by mouth daily as needed for mild pain.   albuterol (2.5 MG/3ML) 0.083% nebulizer solution Commonly known as:  PROVENTIL Take 2.5 mg by nebulization every 6 (six) hours as needed for wheezing or shortness of breath.   albuterol 108 (90 Base) MCG/ACT inhaler Commonly known as:  PROVENTIL HFA;VENTOLIN HFA Inhale 2 puffs into the lungs daily.   allopurinol 100 MG tablet Commonly known as:  ZYLOPRIM Take 1 tablet (100 mg total) by mouth daily.   amiodarone 200 MG tablet Commonly known as:  PACERONE Take 1 tablet (200  mg total) by mouth daily.   ammonium lactate 12 % lotion Commonly known as:  LAC-HYDRIN Apply 1 application topically as needed for dry skin (on both legs).   apixaban 5 MG Tabs tablet Commonly known as:  ELIQUIS Take 1 tablet (5 mg total) by mouth 2 (two) times daily.   calcium-vitamin D 500-200 MG-UNIT tablet Commonly known as:  OSCAL WITH D Take 1 tablet by mouth daily with breakfast.   ezetimibe-simvastatin 10-40 MG tablet Commonly known as:  VYTORIN Take 1 tablet by mouth at bedtime.   guaiFENesin 600 MG 12 hr tablet Commonly known as:  MUCINEX Take 1 tablet (600 mg total) by mouth 2 (two) times daily.   insulin aspart 100 UNIT/ML injection Commonly known as:  novoLOG Inject 0-9 Units into the skin 3 (three) times daily with meals. Correction factor Sliding scale CBG 70 - 120: 0 units CBG 121 - 150: 1 unit,  CBG 151 - 200: 2 units,  CBG 201 - 250: 3 units,  CBG 251 - 300: 5 units,  CBG 301 - 350: 7 units,  CBG 351 - 400: 9 units   CBG > 400: 9 units and notify your MD   insulin NPH Human 100 UNIT/ML injection Commonly known as:  HUMULIN N,NOVOLIN N Inject 20-30 Units into the skin See admin instructions. Inject 20 units in the morning, and 30 units in the evening   levothyroxine 25 MCG tablet Commonly known as:  LEVOXYL Take 1 tablet (25 mcg total) by  mouth daily before breakfast. What changed:  how much to take   metolazone 2.5 MG tablet Commonly known as:  ZAROXOLYN Take 2.5 mg by mouth every Wednesday.   metoprolol tartrate 50 MG tablet Commonly known as:  LOPRESSOR Take 1 tablet (50 mg total) by mouth 2 (two) times daily.   multivitamins with iron Tabs tablet Take 1 tablet by mouth daily.   omeprazole 20 MG capsule Commonly known as:  PRILOSEC Take 20 mg by mouth daily.   ondansetron 4 MG tablet Commonly known as:  ZOFRAN Take 4 mg by mouth every 6 (six) hours as needed for nausea or vomiting.   OXYGEN Inhale 4 L into the lungs continuous.   torsemide 20 MG tablet Commonly known as:  DEMADEX Take 4 tablets (80 mg total) by mouth 2 (two) times daily. What changed:  how much to take   vitamin B-12 500 MCG tablet Commonly known as:  CYANOCOBALAMIN Take 500 mcg by mouth daily.   Vitamin C 500 MG Caps Take 500 mg by mouth daily.   Vitamin D 50 MCG (2000 UT) tablet Take 2,000 Units by mouth daily.     Relevant Imaging Results:  Relevant Lab Results:   Additional Information SS#: [id number]  Candie Chroman, LCSW

## 2018-11-13 NOTE — Progress Notes (Signed)
Patient is being transferred back to facility of where she resides today. Family present. SW to arrange transportation to facility. All personal belongings with patient.  Report called to facility.

## 2018-11-13 NOTE — Discharge Instructions (Signed)

## 2018-11-13 NOTE — Discharge Summary (Signed)
Physician Discharge Summary  Paige Hardin KYH:[id number] DOB: 1946/05/25 DOA: 11/11/2018  PCP: Reymundo Poll, MD  Admit date: 11/11/2018 Discharge date: 11/13/2018  Admitted From: ALF Disposition:  ALF  Recommendations for Outpatient Follow-up:  1. Follow up with PCP in 1-2 weeks 2. Please obtain BMP/CBC in one week your next doctors visit.   Discharge Condition: Stable CODE STATUS: DNR Diet recommendation: Cardiac  Brief/Interim Summary: 72 year old with history of COPD on 4 L nasal cannula, GERD, grade 2 diastolic CHF, diabetes mellitus type 2, paroxysmal atrial fibrillation, hyperlipidemia, morbid obesity with BMI greater than 45 came to the hospital with complaints of fatigue and progressive shortness of breath.  Upon admission she was noted to be fluid overloaded, acute kidney injury and anemic.  During the hospitalization patient was diuresed with 40 mg of IV Lasix twice daily which improved her symptoms quite a bit.  She also received 1 unit of PRBC transfusion at the time of admission and hemoglobin remained stable.  No obvious signs of bleeding was noted. Today patient has reached max benefit from an hospital stay and stable for discharge with outpatient follow-up recommendations as stated above.   Discharge Diagnoses:  Principal Problem:   Acute on chronic diastolic congestive heart failure (HCC) Active Problems:   COPD with emphysema    PAF (paroxysmal atrial fibrillation) s/p multiple DCCVs; on Amiodarone   HTN (hypertension)   Iron deficiency anemia   Hyperlipidemia   CKD (chronic kidney disease), stage IV (HCC)   Type 2 diabetes mellitus (HCC)   Acute exacerbation of CHF (congestive heart failure) (HCC)  Acute respiratory distress with hypoxia, slowly improving Acute on chronic hypoxia, 4 L nasal cannula at home Acute on chronic diastolic congestive heart failure, grade 3 - Her symptoms have significantly improved, she is currently on baseline of 4 L nasal cannula  oxygenation.  We will transition her to home diuretics of torsemide.  She is diuresed while here.  Her procalcitonin is relatively unequivocal therefore holding off on antibiotics.  She can resume home bronchodilators as needed.  Iron deficiency anemia -She received 1 unit of PRBC transfusion at the time of admission as it was thought slight anemia could be contributing to her shortness of breath.  No further signs of bleeding.  Hemoglobin remained stable  Chronic kidney disease stage III - Creatinine is at baseline around 1.7.  Paroxysmal atrial fibrillation -Continue amiodarone 200 mg daily, metoprolol 50 mg twice daily, Eliquis 5 mg twice daily  COPD with emphysema - Continue bronchodilators as needed.  Supplemental oxygen at 4 L nasal cannula  Essential hypertension -Resume home meds  Hyperlipidemia -Continue statin  Diabetes mellitus type 2 -Resume home insulin.  Insulin sliding scale and Accu-Chek  Hypothyroidism -Continue Synthroid  Patient was on Eliquis during this hospitalization She is DNR Discharge back to the facility-assisted living  Discharge Instructions   Allergies as of 11/13/2018      Reactions   Ace Inhibitors Cough   Statins Cough   Clindamycin Rash      Medication List    STOP taking these medications   Tiotropium Bromide-Olodaterol 2.5-2.5 MCG/ACT Aers Commonly known as:  STIOLTO RESPIMAT     TAKE these medications   acetaminophen 325 MG tablet Commonly known as:  TYLENOL Take 325 mg by mouth daily as needed for mild pain.   albuterol (2.5 MG/3ML) 0.083% nebulizer solution Commonly known as:  PROVENTIL Take 2.5 mg by nebulization every 6 (six) hours as needed for wheezing or shortness of breath.  albuterol 108 (90 Base) MCG/ACT inhaler Commonly known as:  PROVENTIL HFA;VENTOLIN HFA Inhale 2 puffs into the lungs daily.   allopurinol 100 MG tablet Commonly known as:  ZYLOPRIM Take 1 tablet (100 mg total) by mouth daily.    amiodarone 200 MG tablet Commonly known as:  PACERONE Take 1 tablet (200 mg total) by mouth daily.   ammonium lactate 12 % lotion Commonly known as:  LAC-HYDRIN Apply 1 application topically as needed for dry skin (on both legs).   apixaban 5 MG Tabs tablet Commonly known as:  ELIQUIS Take 1 tablet (5 mg total) by mouth 2 (two) times daily.   calcium-vitamin D 500-200 MG-UNIT tablet Commonly known as:  OSCAL WITH D Take 1 tablet by mouth daily with breakfast.   ezetimibe-simvastatin 10-40 MG tablet Commonly known as:  VYTORIN Take 1 tablet by mouth at bedtime.   guaiFENesin 600 MG 12 hr tablet Commonly known as:  MUCINEX Take 1 tablet (600 mg total) by mouth 2 (two) times daily.   insulin aspart 100 UNIT/ML injection Commonly known as:  novoLOG Inject 0-9 Units into the skin 3 (three) times daily with meals. Correction factor Sliding scale CBG 70 - 120: 0 units CBG 121 - 150: 1 unit,  CBG 151 - 200: 2 units,  CBG 201 - 250: 3 units,  CBG 251 - 300: 5 units,  CBG 301 - 350: 7 units,  CBG 351 - 400: 9 units   CBG > 400: 9 units and notify your MD   insulin NPH Human 100 UNIT/ML injection Commonly known as:  HUMULIN N,NOVOLIN N Inject 20-30 Units into the skin See admin instructions. Inject 20 units in the morning, and 30 units in the evening   levothyroxine 25 MCG tablet Commonly known as:  LEVOXYL Take 1 tablet (25 mcg total) by mouth daily before breakfast. What changed:  how much to take   metolazone 2.5 MG tablet Commonly known as:  ZAROXOLYN Take 2.5 mg by mouth every Wednesday.   metoprolol tartrate 50 MG tablet Commonly known as:  LOPRESSOR Take 1 tablet (50 mg total) by mouth 2 (two) times daily.   multivitamins with iron Tabs tablet Take 1 tablet by mouth daily.   omeprazole 20 MG capsule Commonly known as:  PRILOSEC Take 20 mg by mouth daily.   ondansetron 4 MG tablet Commonly known as:  ZOFRAN Take 4 mg by mouth every 6 (six) hours as needed for  nausea or vomiting.   OXYGEN Inhale 4 L into the lungs continuous.   torsemide 20 MG tablet Commonly known as:  DEMADEX Take 4 tablets (80 mg total) by mouth 2 (two) times daily. What changed:  how much to take   vitamin B-12 500 MCG tablet Commonly known as:  CYANOCOBALAMIN Take 500 mcg by mouth daily.   Vitamin C 500 MG Caps Take 500 mg by mouth daily.   Vitamin D 50 MCG (2000 UT) tablet Take 2,000 Units by mouth daily.      Follow-up Information    Reymundo Poll, MD. Schedule an appointment as soon as possible for a visit in 1 week(s).   Specialty:  Family Medicine Contact information: [address] [place] [address] [phone]          Allergies  Allergen Reactions  . Ace Inhibitors Cough  . Statins Cough  . Clindamycin Rash    You were cared for by a hospitalist during your hospital stay. If you have any questions about  your discharge medications or the care you received while you were in the hospital after you are discharged, you can call the unit and asked to speak with the hospitalist on call if the hospitalist that took care of you is not available. Once you are discharged, your primary care physician will handle any further medical issues. Please note that no refills for any discharge medications will be authorized once you are discharged, as it is imperative that you return to your primary care physician (or establish a relationship with a primary care physician if you do not have one) for your aftercare needs so that they can reassess your need for medications and monitor your lab values.  Consultations:  None   Procedures/Studies: Dg Chest Port 1 View  Result Date: 11/12/2018 CLINICAL DATA:  Acute respiratory distress increased shortness of breath. EXAM: PORTABLE CHEST 1 VIEW COMPARISON:  Radiograph yesterday FINDINGS: Cardiomegaly is unchanged. Aortic atherosclerosis. Pulmonary edema without significant change. No large pleural  effusion. No new airspace disease. No pneumothorax. IMPRESSION: Unchanged CHF since radiographs yesterday with cardiomegaly and pulmonary edema. Aortic Atherosclerosis (ICD10-I70.0). Electronically Signed   By: Keith Rake M.D.   On: 11/12/2018 02:05   Dg Chest Port 1 View  Result Date: 11/11/2018 CLINICAL DATA:  Shortness of breath EXAM: PORTABLE CHEST 1 VIEW COMPARISON:  09/13/2018, 09/06/2018 FINDINGS: Marked enlargement of cardiac silhouette. Diffuse interstitial opacities throughout both lungs concerning for superimposed edema, similar to the prior study. Mild apical emphysema, better appreciated on comparison CT. No large effusion or pneumothorax. Aorta atherosclerotic. Trachea is midline. Previously described right lower lobe pulmonary nodule by chest CT 12/18/2017 is not well demonstrated by plain radiography. IMPRESSION: Cardiomegaly with diffuse interstitial edema pattern concerning for CHF. Electronically Signed   By: Jerilynn Mages.  Shick M.D.   On: 11/11/2018 11:37      Subjective: Feels a whole lot better, her lower extremity swelling is down.   Discharge Exam: Vitals:   11/13/18 0045 11/13/18 0604  BP: (!) 129/56 (!) 117/57  Pulse: 69 63  Resp: 20 18  Temp: 97.9 F (36.6 C) 97.7 F (36.5 C)  SpO2: 94% 96%   Vitals:   11/12/18 1948 11/12/18 2221 11/13/18 0045 11/13/18 0604  BP: (!) 133/50 (!) 135/56 (!) 129/56 (!) 117/57  Pulse: 72 70 69 63  Resp: 20  20 18   Temp: 97.8 F (36.6 C)  97.9 F (36.6 C) 97.7 F (36.5 C)  TempSrc: Oral  Oral Oral  SpO2: 95%  94% 96%  Weight:    131.3 kg  Height:        General: Pt is alert, awake, not in acute distress, on baseline 4 L nasal cannula Cardiovascular: RRR, S1/S2 +, no rubs, no gallops Respiratory: CTA bilaterally, no wheezing, no rhonchi Abdominal: Soft, NT, ND, bowel sounds + Extremities: no edema, no cyanosis    The results of significant diagnostics from this hospitalization (including imaging, microbiology, ancillary  and laboratory) are listed below for reference.     Microbiology: Recent Results (from the past 240 hour(s))  MRSA PCR Screening     Status: None   Collection Time: 11/11/18  4:00 PM  Result Value Ref Range Status   MRSA by PCR NEGATIVE NEGATIVE Final    Comment:        The GeneXpert MRSA Assay (FDA approved for NASAL specimens only), is one component of a comprehensive MRSA colonization surveillance program. It is not intended to diagnose MRSA infection nor to guide or monitor treatment for MRSA  infections. Performed at [facility], [place] [address], [place], [place] [address]      Labs: BNP (last 3 results) Recent Labs    09/07/18 0035 11/11/18 1130  BNP 1,037.1* 323.5*   Basic Metabolic Panel: Recent Labs  Lab 11/11/18 1130 11/12/18 0501 11/13/18 0455  NA 138 136 137  K 3.2* 3.5 3.7  CL 86* 86* 86*  CO2 42* 38* 37*  GLUCOSE 98 89 138*  BUN 54* 54* 58*  CREATININE 1.83* 1.78* 1.62*  CALCIUM 9.0 8.7* 8.7*  MG 2.0  --  2.2   Liver Function Tests: Recent Labs  Lab 11/11/18 1130  AST 19  ALT 17  ALKPHOS 55  BILITOT 0.9  PROT 6.4*  ALBUMIN 2.8*   No results for input(s): LIPASE, AMYLASE in the last 168 hours. No results for input(s): AMMONIA in the last 168 hours. CBC: Recent Labs  Lab 11/11/18 1130 11/12/18 0501 11/13/18 0455  WBC 12.0* 12.1* 9.7  NEUTROABS 10.7*  --   --   HGB 8.0* 8.9* 8.7*  HCT 28.8* 30.0* 30.0*  MCV 98.0 95.2 96.2  PLT 246 219 196   Cardiac Enzymes: No results for input(s): CKTOTAL, CKMB, CKMBINDEX, TROPONINI in the last 168 hours. BNP: Invalid input(s): POCBNP CBG: Recent Labs  Lab 11/12/18 1131 11/12/18 1626 11/12/18 2123 11/13/18 0752 11/13/18 1133  GLUCAP 159* 139* 221* 126* 127*   D-Dimer No results for input(s): DDIMER in the last 72 hours. Hgb A1c No results for input(s): HGBA1C in the last 72 hours. Lipid Profile No results for input(s): CHOL, HDL, LDLCALC, TRIG, CHOLHDL, LDLDIRECT in the last  72 hours. Thyroid function studies No results for input(s): TSH, T4TOTAL, T3FREE, THYROIDAB in the last 72 hours.  Invalid input(s): FREET3 Anemia work up No results for input(s): VITAMINB12, FOLATE, FERRITIN, TIBC, IRON, RETICCTPCT in the last 72 hours. Urinalysis    Component Value Date/Time   COLORURINE YELLOW 09/07/2018 [place] 09/07/2018 0717   LABSPEC 1.008 09/07/2018 0717   PHURINE 5.0 09/07/2018 0717   GLUCOSEU NEGATIVE 09/07/2018 0717   HGBUR SMALL (A) 09/07/2018 0717   BILIRUBINUR NEGATIVE 09/07/2018 0717   KETONESUR NEGATIVE 09/07/2018 0717   PROTEINUR NEGATIVE 09/07/2018 0717   UROBILINOGEN 1.0 03/23/2014 1915   NITRITE POSITIVE (A) 09/07/2018 0717   LEUKOCYTESUR LARGE (A) 09/07/2018 0717   Sepsis Labs Invalid input(s): PROCALCITONIN,  WBC,  LACTICIDVEN Microbiology Recent Results (from the past 240 hour(s))  MRSA PCR Screening     Status: None   Collection Time: 11/11/18  4:00 PM  Result Value Ref Range Status   MRSA by PCR NEGATIVE NEGATIVE Final    Comment:        The GeneXpert MRSA Assay (FDA approved for NASAL specimens only), is one component of a comprehensive MRSA colonization surveillance program. It is not intended to diagnose MRSA infection nor to guide or monitor treatment for MRSA infections. Performed at [facility], [place] [address] [place], [place] [address]      Time coordinating discharge:  I have spent 35 minutes face to face with the patient and on the ward discussing the patients care, assessment, plan and disposition with other care givers. >50% of the time was devoted counseling the patient about the risks and benefits of treatment/Discharge disposition and coordinating care.   SIGNED:   Damita Lack, MD  [facility] 11/13/2018, 11:49 AM Pager   If 7PM-7AM, please contact night-coverage [email or web address] Password [email or web address]

## 2018-11-15 DIAGNOSIS — R0902 Hypoxemia: Secondary | ICD-10-CM | POA: Diagnosis not present

## 2018-11-15 DIAGNOSIS — M109 Gout, unspecified: Secondary | ICD-10-CM | POA: Diagnosis not present

## 2018-11-15 DIAGNOSIS — E785 Hyperlipidemia, unspecified: Secondary | ICD-10-CM | POA: Diagnosis not present

## 2018-11-15 DIAGNOSIS — K219 Gastro-esophageal reflux disease without esophagitis: Secondary | ICD-10-CM | POA: Diagnosis not present

## 2018-11-15 DIAGNOSIS — N184 Chronic kidney disease, stage 4 (severe): Secondary | ICD-10-CM | POA: Diagnosis not present

## 2018-11-15 DIAGNOSIS — J449 Chronic obstructive pulmonary disease, unspecified: Secondary | ICD-10-CM | POA: Diagnosis not present

## 2018-11-15 DIAGNOSIS — D638 Anemia in other chronic diseases classified elsewhere: Secondary | ICD-10-CM | POA: Diagnosis not present

## 2018-11-15 DIAGNOSIS — E039 Hypothyroidism, unspecified: Secondary | ICD-10-CM | POA: Diagnosis not present

## 2018-11-15 DIAGNOSIS — E119 Type 2 diabetes mellitus without complications: Secondary | ICD-10-CM | POA: Diagnosis not present

## 2018-11-15 DIAGNOSIS — J9610 Chronic respiratory failure, unspecified whether with hypoxia or hypercapnia: Secondary | ICD-10-CM | POA: Diagnosis not present

## 2018-11-15 DIAGNOSIS — R0602 Shortness of breath: Secondary | ICD-10-CM | POA: Diagnosis not present

## 2018-11-15 DIAGNOSIS — I5030 Unspecified diastolic (congestive) heart failure: Secondary | ICD-10-CM | POA: Diagnosis not present

## 2018-11-15 DIAGNOSIS — I959 Hypotension, unspecified: Secondary | ICD-10-CM | POA: Diagnosis not present

## 2018-11-15 DIAGNOSIS — R062 Wheezing: Secondary | ICD-10-CM | POA: Diagnosis not present

## 2018-11-15 DIAGNOSIS — I4891 Unspecified atrial fibrillation: Secondary | ICD-10-CM | POA: Diagnosis not present

## 2018-11-16 DIAGNOSIS — J9610 Chronic respiratory failure, unspecified whether with hypoxia or hypercapnia: Secondary | ICD-10-CM | POA: Diagnosis not present

## 2018-11-16 DIAGNOSIS — I5030 Unspecified diastolic (congestive) heart failure: Secondary | ICD-10-CM | POA: Diagnosis not present

## 2018-11-16 DIAGNOSIS — D638 Anemia in other chronic diseases classified elsewhere: Secondary | ICD-10-CM | POA: Diagnosis not present

## 2018-11-16 DIAGNOSIS — N184 Chronic kidney disease, stage 4 (severe): Secondary | ICD-10-CM | POA: Diagnosis not present

## 2018-11-16 DIAGNOSIS — I4891 Unspecified atrial fibrillation: Secondary | ICD-10-CM | POA: Diagnosis not present

## 2018-11-16 DIAGNOSIS — J449 Chronic obstructive pulmonary disease, unspecified: Secondary | ICD-10-CM | POA: Diagnosis not present

## 2018-11-18 DIAGNOSIS — J9610 Chronic respiratory failure, unspecified whether with hypoxia or hypercapnia: Secondary | ICD-10-CM | POA: Diagnosis not present

## 2018-11-18 DIAGNOSIS — I5030 Unspecified diastolic (congestive) heart failure: Secondary | ICD-10-CM | POA: Diagnosis not present

## 2018-11-18 DIAGNOSIS — D638 Anemia in other chronic diseases classified elsewhere: Secondary | ICD-10-CM | POA: Diagnosis not present

## 2018-11-18 DIAGNOSIS — I4891 Unspecified atrial fibrillation: Secondary | ICD-10-CM | POA: Diagnosis not present

## 2018-11-18 DIAGNOSIS — J449 Chronic obstructive pulmonary disease, unspecified: Secondary | ICD-10-CM | POA: Diagnosis not present

## 2018-11-18 DIAGNOSIS — N184 Chronic kidney disease, stage 4 (severe): Secondary | ICD-10-CM | POA: Diagnosis not present

## 2018-11-21 DIAGNOSIS — J449 Chronic obstructive pulmonary disease, unspecified: Secondary | ICD-10-CM | POA: Diagnosis not present

## 2018-11-21 DIAGNOSIS — J9610 Chronic respiratory failure, unspecified whether with hypoxia or hypercapnia: Secondary | ICD-10-CM | POA: Diagnosis not present

## 2018-11-21 DIAGNOSIS — I4891 Unspecified atrial fibrillation: Secondary | ICD-10-CM | POA: Diagnosis not present

## 2018-11-21 DIAGNOSIS — N184 Chronic kidney disease, stage 4 (severe): Secondary | ICD-10-CM | POA: Diagnosis not present

## 2018-11-21 DIAGNOSIS — I5030 Unspecified diastolic (congestive) heart failure: Secondary | ICD-10-CM | POA: Diagnosis not present

## 2018-11-21 DIAGNOSIS — D638 Anemia in other chronic diseases classified elsewhere: Secondary | ICD-10-CM | POA: Diagnosis not present

## 2018-11-22 DIAGNOSIS — D638 Anemia in other chronic diseases classified elsewhere: Secondary | ICD-10-CM | POA: Diagnosis not present

## 2018-11-22 DIAGNOSIS — I4891 Unspecified atrial fibrillation: Secondary | ICD-10-CM | POA: Diagnosis not present

## 2018-11-22 DIAGNOSIS — J9610 Chronic respiratory failure, unspecified whether with hypoxia or hypercapnia: Secondary | ICD-10-CM | POA: Diagnosis not present

## 2018-11-22 DIAGNOSIS — N184 Chronic kidney disease, stage 4 (severe): Secondary | ICD-10-CM | POA: Diagnosis not present

## 2018-11-22 DIAGNOSIS — I5030 Unspecified diastolic (congestive) heart failure: Secondary | ICD-10-CM | POA: Diagnosis not present

## 2018-11-22 DIAGNOSIS — J449 Chronic obstructive pulmonary disease, unspecified: Secondary | ICD-10-CM | POA: Diagnosis not present

## 2018-11-25 DIAGNOSIS — D638 Anemia in other chronic diseases classified elsewhere: Secondary | ICD-10-CM | POA: Diagnosis not present

## 2018-11-25 DIAGNOSIS — I5030 Unspecified diastolic (congestive) heart failure: Secondary | ICD-10-CM | POA: Diagnosis not present

## 2018-11-25 DIAGNOSIS — J449 Chronic obstructive pulmonary disease, unspecified: Secondary | ICD-10-CM | POA: Diagnosis not present

## 2018-11-25 DIAGNOSIS — I4891 Unspecified atrial fibrillation: Secondary | ICD-10-CM | POA: Diagnosis not present

## 2018-11-25 DIAGNOSIS — N184 Chronic kidney disease, stage 4 (severe): Secondary | ICD-10-CM | POA: Diagnosis not present

## 2018-11-25 DIAGNOSIS — J9610 Chronic respiratory failure, unspecified whether with hypoxia or hypercapnia: Secondary | ICD-10-CM | POA: Diagnosis not present

## 2018-11-29 ENCOUNTER — Ambulatory Visit: Payer: Medicare Other | Admitting: Hematology & Oncology

## 2018-11-29 ENCOUNTER — Other Ambulatory Visit: Payer: Medicare Other

## 2018-12-31 ENCOUNTER — Encounter (HOSPITAL_COMMUNITY): Payer: Medicare Other | Admitting: Cardiology

## 2019-01-26 DEATH — deceased

## 2019-08-24 IMAGING — CT CT CHEST W/O CM
2 of 3 series · 15 of 36 positions shown, 18 images · non-contrast
Comparison: Chest x-ray from 10/17/2016 as well as CTs from
09/12/2016 and 10/07/2015

CLINICAL DATA: Follow-up pulmonary nodule

EXAM:
CT CHEST WITHOUT CONTRAST
TECHNIQUE: Multidetector CT imaging of the chest was performed following the
standard protocol without IV contrast.

[Series 2: thorax · axial · 0.84mm/px · z∈[-372,-98]mm · 12 of 161 slices shown, 15 images]
[im 12/161  mediastinal]
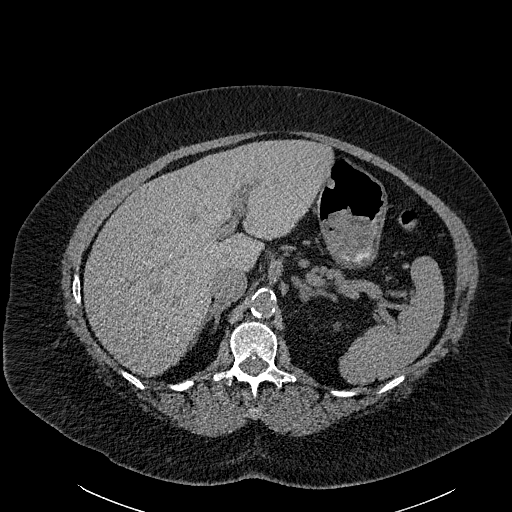
[im 12/161  lung]
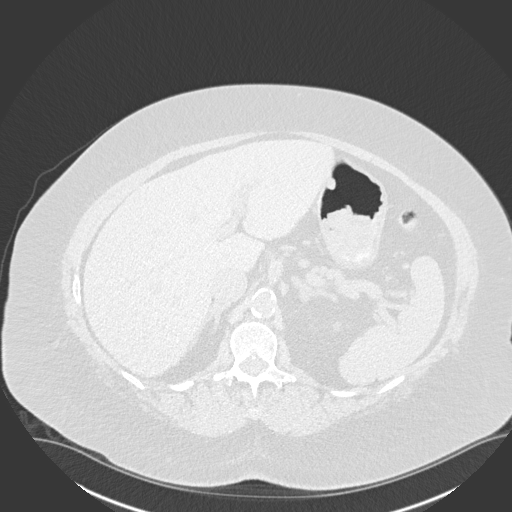
[im 24/161  lung]
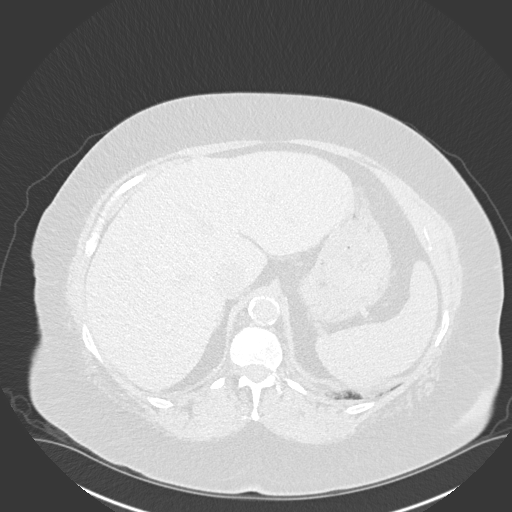
[im 36/161  lung]
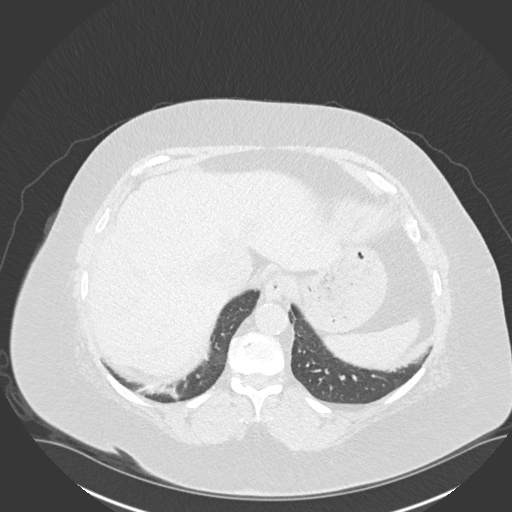
[im 48/161  lung]
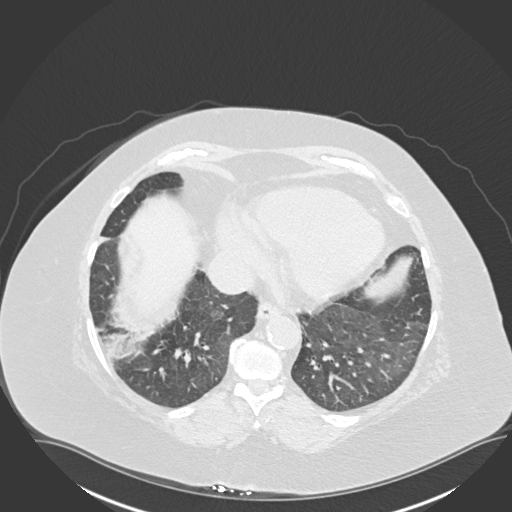
[im 60/161  mediastinal]
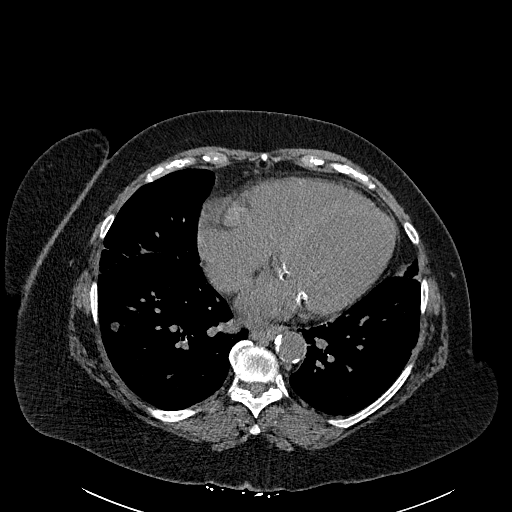
[im 60/161  lung]
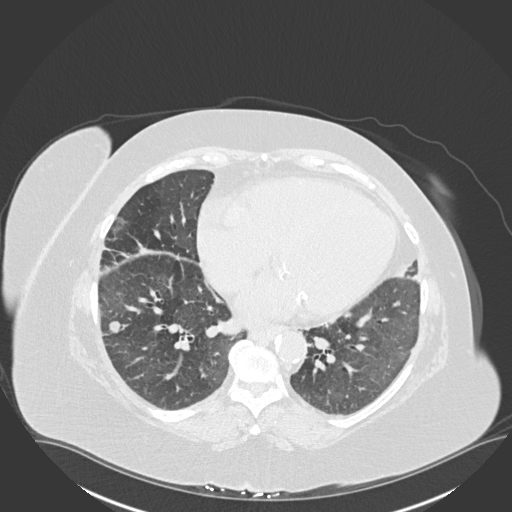
[im 72/161  lung]
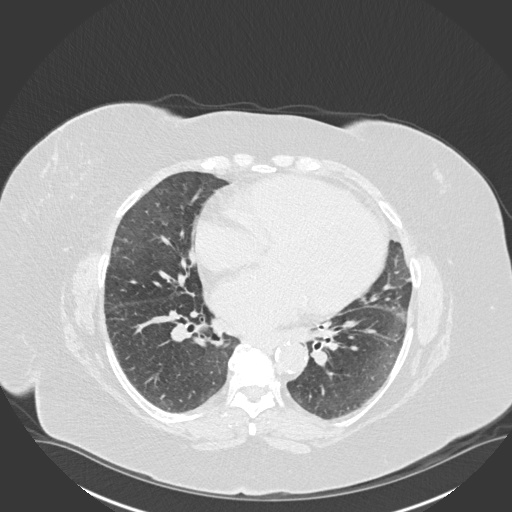
[im 89/161  lung]
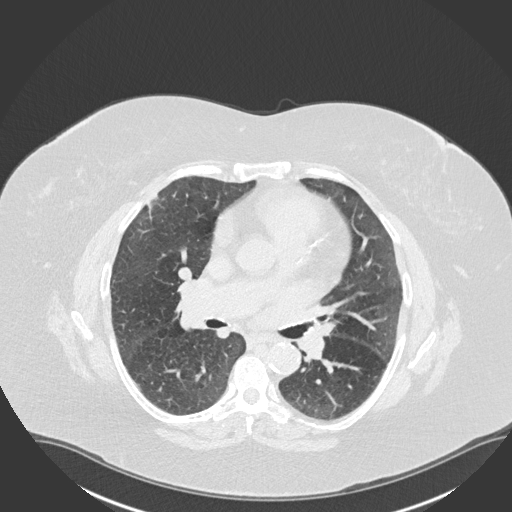
[im 101/161  lung]
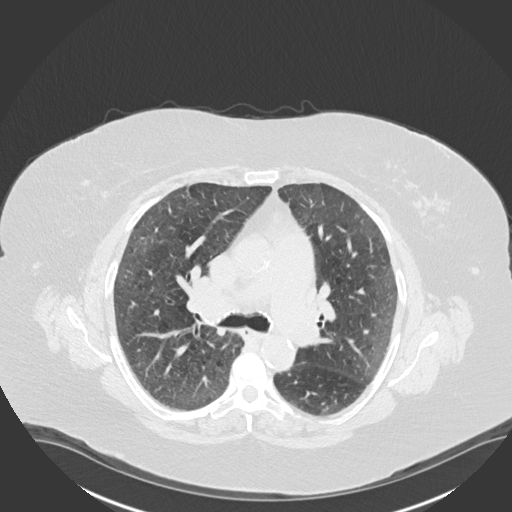
[im 113/161  mediastinal]
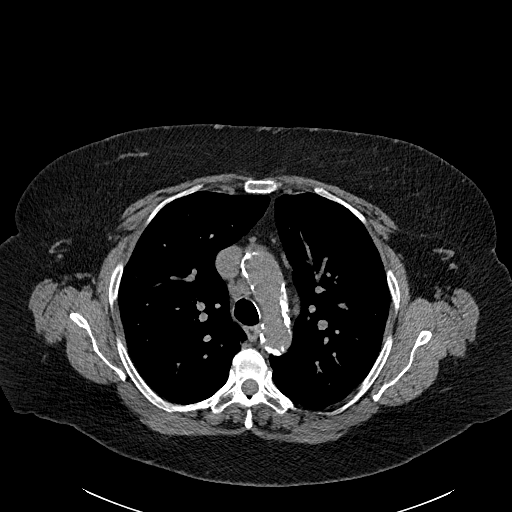
[im 113/161  lung]
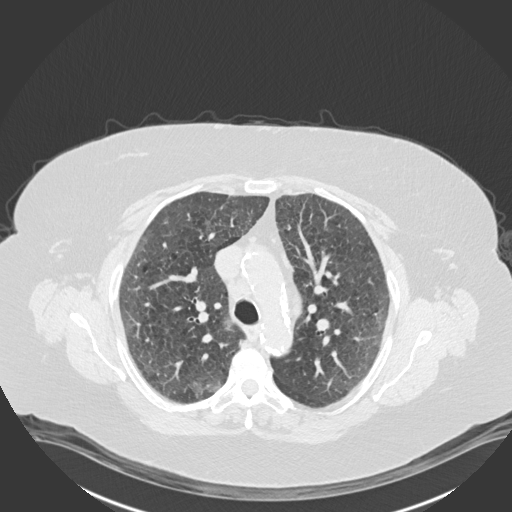
[im 125/161  lung]
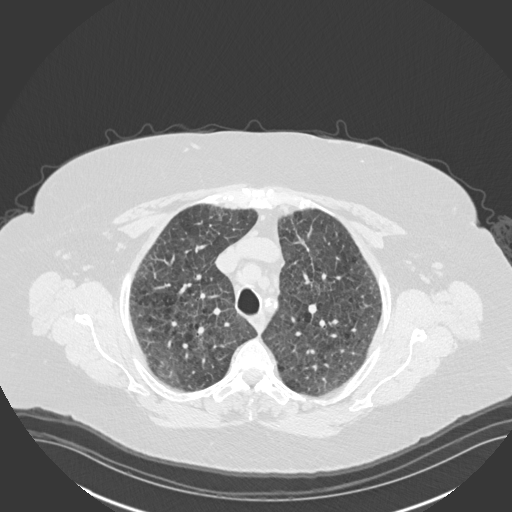
[im 137/161  lung]
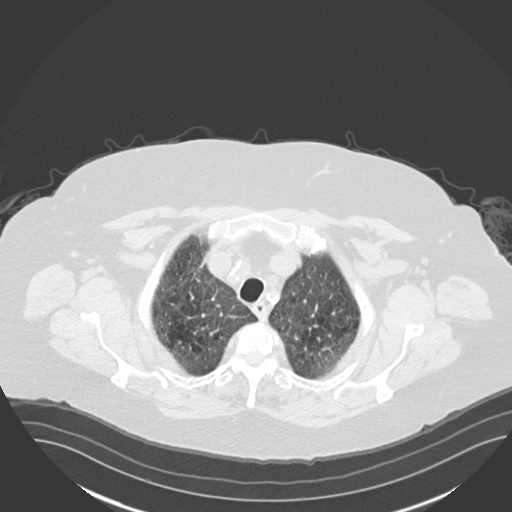
[im 149/161  lung]
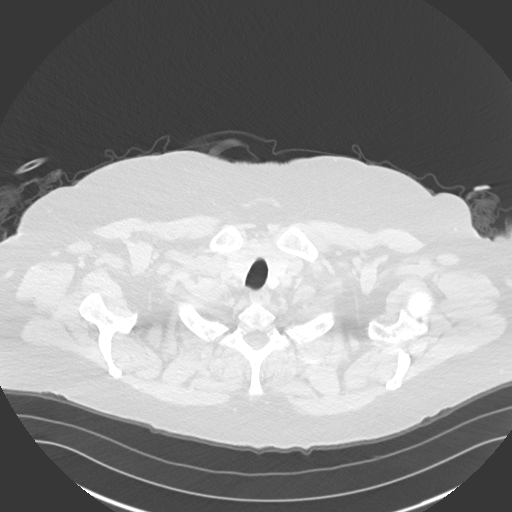

[Series 5: coronal · coronal · 0.63mm/px · 3 of 130 slices shown]
[im 26/130  lung]
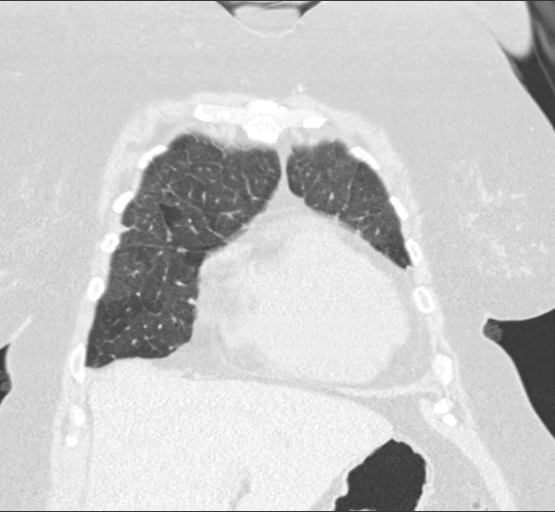
[im 52/130  lung]
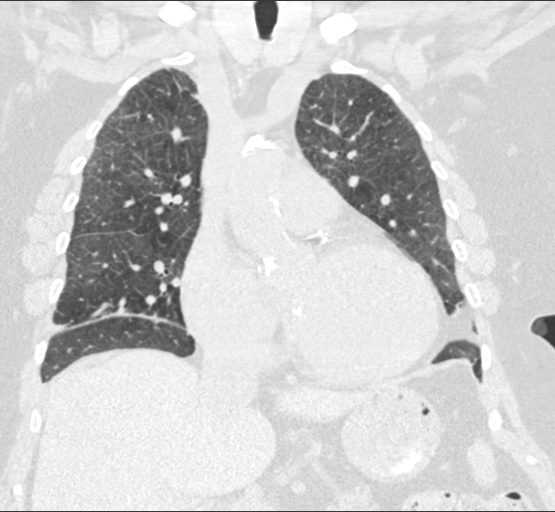
[im 78/130  lung]
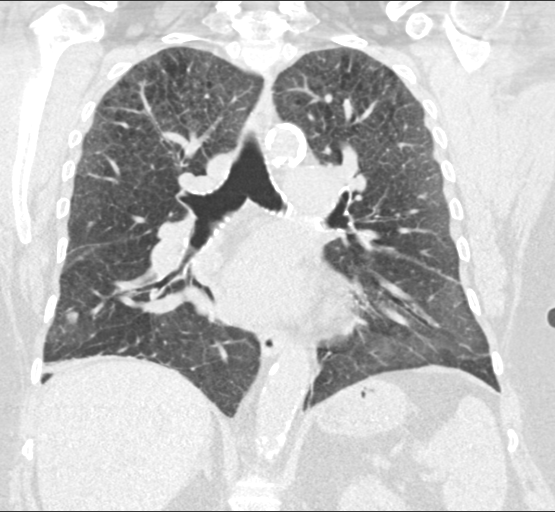

[15 of 36 positions shown; findings below may reference images not displayed]

FINDINGS: Cardiovascular: Somewhat limited due to lack of IV contrast. Aortic
atherosclerotic changes are noted without aneurysmal dilatation.
Cardiac enlargement is seen. Coronary calcifications are noted as
well as mitral annular calcifications and aortic valve
calcifications.

Mediastinum/Nodes: The thoracic inlet is stable. No significant
hilar or mediastinal adenopathy is identified. The esophagus is
within normal limits.

Lungs/Pleura: Lungs are well aerated bilaterally. Some diffuse
emphysematous changes are identified somewhat progressed from the
prior exam but also somewhat more accentuated by the thinner section
technique. No focal infiltrate or sizable effusion is seen. Minimal
basilar atelectasis is noted bilaterally. The previously noted right
lower lobe nodule is again identified best seen on image number 100
of series 3. It measures 12.8 mm in greatest dimension but likely
stable from the prior exam given some difference in the imaging
technique with thinner slices. Central decreased attenuation again
is most consistent with a hamartoma. Tiny 3 mm nodule is again
identified best seen on image number 101 of series 3. No other
significant nodules are seen.

Upper Abdomen: Visualized upper abdomen is unremarkable.

Musculoskeletal: Mild degenerative changes of the thoracic spine are
seen. No acute bony abnormality is noted.
IMPRESSION: Slightly more prominent right lower lobe nodule when compared with
the prior exam. Central decreased attenuation is again noted
compatible with a hamartoma. The slight growth may be in part due to
thinner slice sections when compared with the prior exam. Stable
smaller 3 mm nodule is noted in the right lower lobe as well.

Mild progressive emphysematous changes particularly in the upper
lobes somewhat accentuated by the thinner sections as well.

Aortic Atherosclerosis (RYASP-VRV.V) and Emphysema (RYASP-QL0.T).

## 2024-06-18 NOTE — Telephone Encounter (Signed)
 error
